# Patient Record
Sex: Male | Born: 1955
Health system: Southern US, Community
[De-identification: ages and names within clinical notes are randomized; demographics above are authoritative.]

## PROBLEM LIST (undated history)

## (undated) DIAGNOSIS — Z973 Presence of spectacles and contact lenses: Secondary | ICD-10-CM

## (undated) DIAGNOSIS — N529 Male erectile dysfunction, unspecified: Secondary | ICD-10-CM

## (undated) DIAGNOSIS — N189 Chronic kidney disease, unspecified: Secondary | ICD-10-CM

## (undated) DIAGNOSIS — F419 Anxiety disorder, unspecified: Secondary | ICD-10-CM

## (undated) DIAGNOSIS — Z9989 Dependence on other enabling machines and devices: Secondary | ICD-10-CM

## (undated) DIAGNOSIS — Z951 Presence of aortocoronary bypass graft: Secondary | ICD-10-CM

## (undated) DIAGNOSIS — I10 Essential (primary) hypertension: Secondary | ICD-10-CM

## (undated) DIAGNOSIS — K219 Gastro-esophageal reflux disease without esophagitis: Secondary | ICD-10-CM

## (undated) DIAGNOSIS — E782 Mixed hyperlipidemia: Secondary | ICD-10-CM

## (undated) DIAGNOSIS — R911 Solitary pulmonary nodule: Secondary | ICD-10-CM

## (undated) DIAGNOSIS — E119 Type 2 diabetes mellitus without complications: Secondary | ICD-10-CM

## (undated) DIAGNOSIS — I2510 Atherosclerotic heart disease of native coronary artery without angina pectoris: Secondary | ICD-10-CM

## (undated) DIAGNOSIS — G4733 Obstructive sleep apnea (adult) (pediatric): Secondary | ICD-10-CM

## (undated) DIAGNOSIS — Z8719 Personal history of other diseases of the digestive system: Secondary | ICD-10-CM

## (undated) DIAGNOSIS — F32A Depression, unspecified: Secondary | ICD-10-CM

## (undated) DIAGNOSIS — N35912 Unspecified bulbous urethral stricture, male: Secondary | ICD-10-CM

## (undated) HISTORY — DX: Type 2 diabetes mellitus without complications: E11.9

## (undated) HISTORY — PX: TONSILLECTOMY: SUR1361

## (undated) HISTORY — DX: Male erectile dysfunction, unspecified: N52.9

## (undated) HISTORY — PX: CORONARY ARTERY BYPASS GRAFT: SHX141

## (undated) HISTORY — DX: Mixed hyperlipidemia: E78.2

## (undated) HISTORY — PX: CARDIAC CATHETERIZATION: SHX172

## (undated) HISTORY — PX: OTHER SURGICAL HISTORY: SHX169

## (undated) HISTORY — DX: Essential (primary) hypertension: I10

## (undated) HISTORY — PX: CYSTOSCOPY WITH DIRECT VISION INTERNAL URETHROTOMY: SHX6637

---

## 1998-08-13 ENCOUNTER — Ambulatory Visit (HOSPITAL_COMMUNITY): Admission: RE | Admit: 1998-08-13 | Discharge: 1998-08-13 | Payer: Self-pay | Admitting: Family Medicine

## 1998-08-13 ENCOUNTER — Encounter: Payer: Self-pay | Admitting: Family Medicine

## 2003-04-03 ENCOUNTER — Ambulatory Visit (HOSPITAL_BASED_OUTPATIENT_CLINIC_OR_DEPARTMENT_OTHER): Admission: RE | Admit: 2003-04-03 | Discharge: 2003-04-03 | Payer: Self-pay | Admitting: Family Medicine

## 2003-08-01 ENCOUNTER — Inpatient Hospital Stay (HOSPITAL_COMMUNITY): Admission: EM | Admit: 2003-08-01 | Discharge: 2003-08-06 | Payer: Self-pay | Admitting: Emergency Medicine

## 2003-08-01 ENCOUNTER — Encounter: Payer: Self-pay | Admitting: Emergency Medicine

## 2003-08-05 ENCOUNTER — Encounter: Payer: Self-pay | Admitting: Internal Medicine

## 2003-11-21 DIAGNOSIS — Z8719 Personal history of other diseases of the digestive system: Secondary | ICD-10-CM

## 2003-11-21 HISTORY — DX: Personal history of other diseases of the digestive system: Z87.19

## 2004-12-21 ENCOUNTER — Ambulatory Visit (HOSPITAL_COMMUNITY): Admission: EM | Admit: 2004-12-21 | Discharge: 2004-12-21 | Payer: Self-pay | Admitting: Emergency Medicine

## 2007-07-26 IMAGING — CR DG CHEST 2V
2 series · 2 of 2 positions shown · non-contrast
Comparison: None

CLINICAL DATA: Preop.

CHEST - 2 VIEW

[w chest pa *]
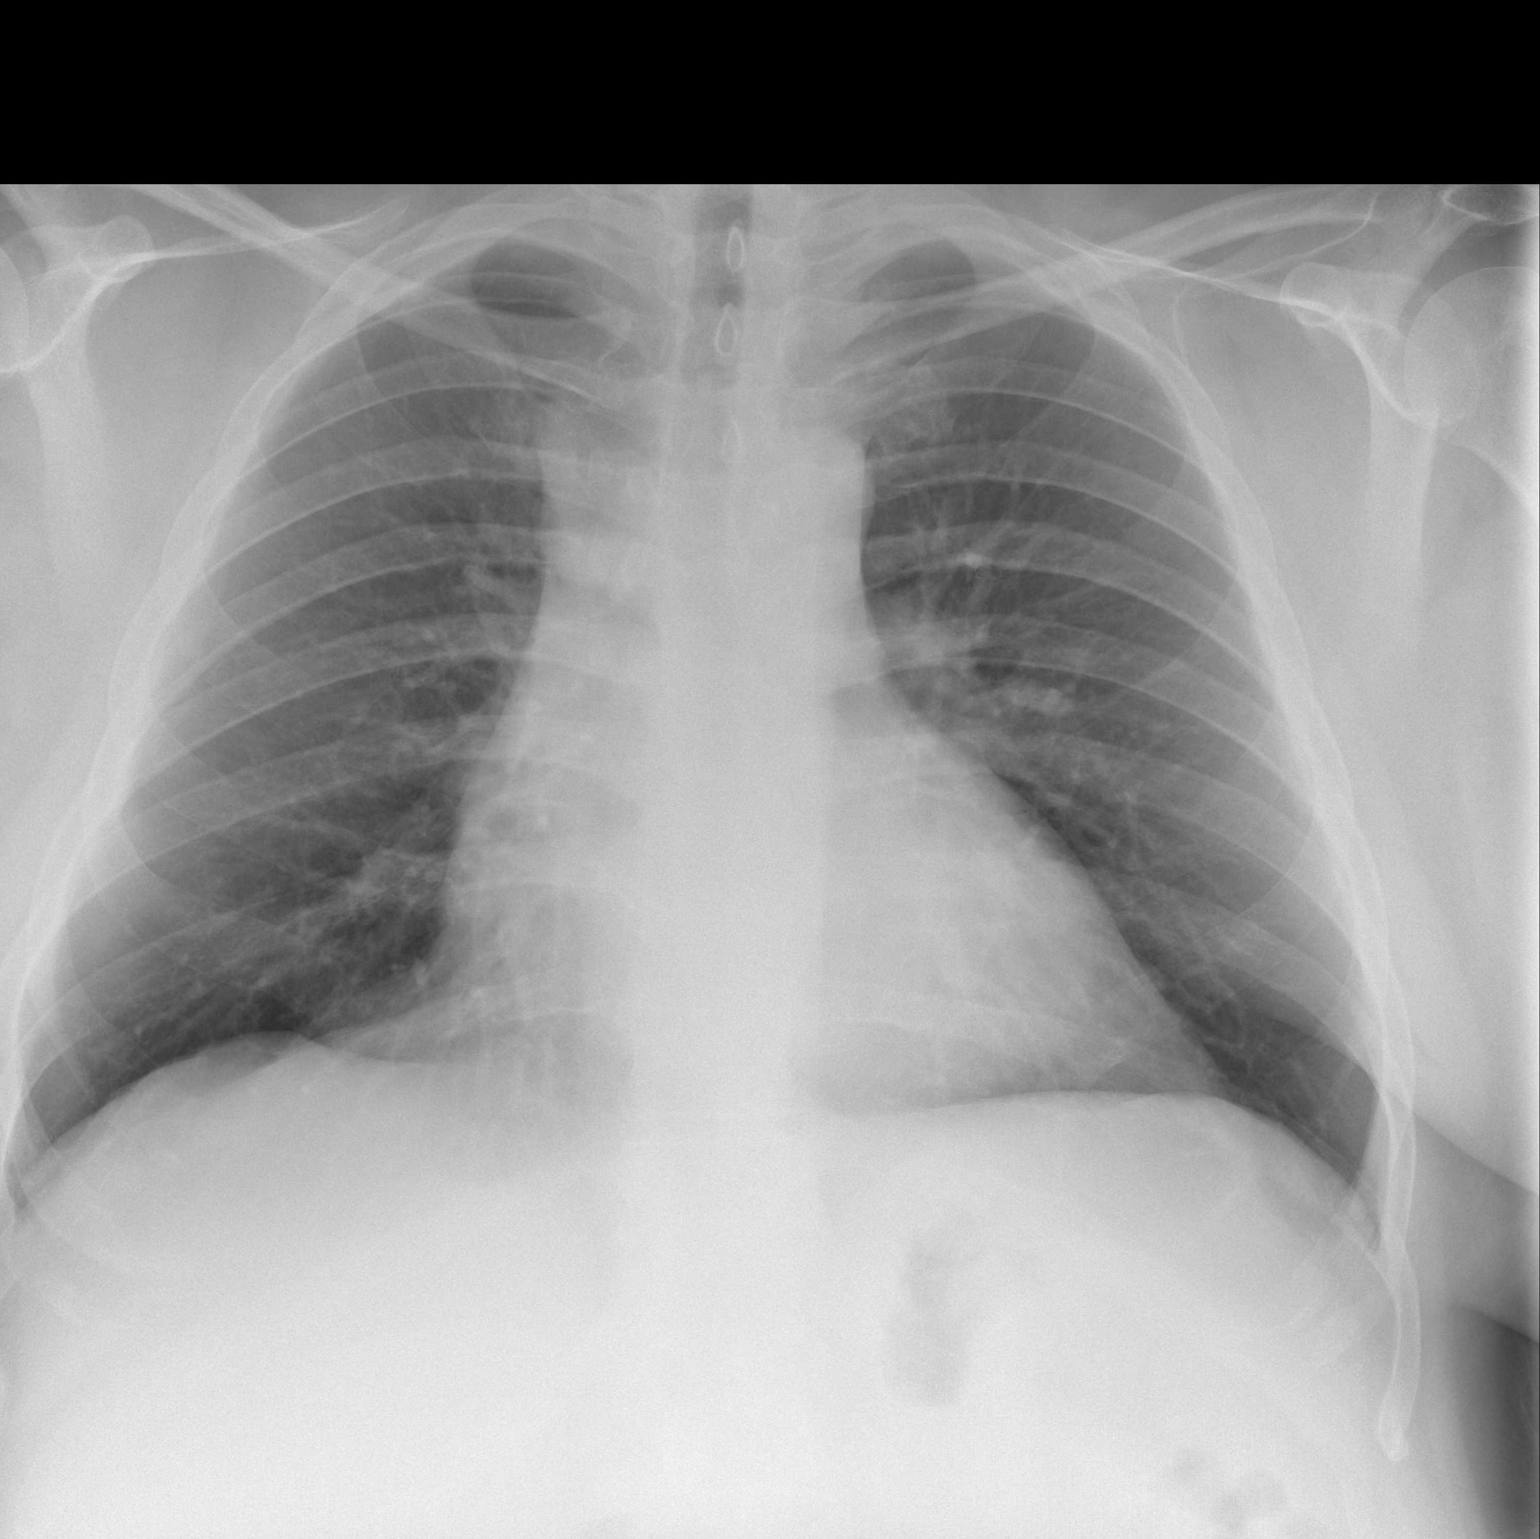

[w chest lat *]
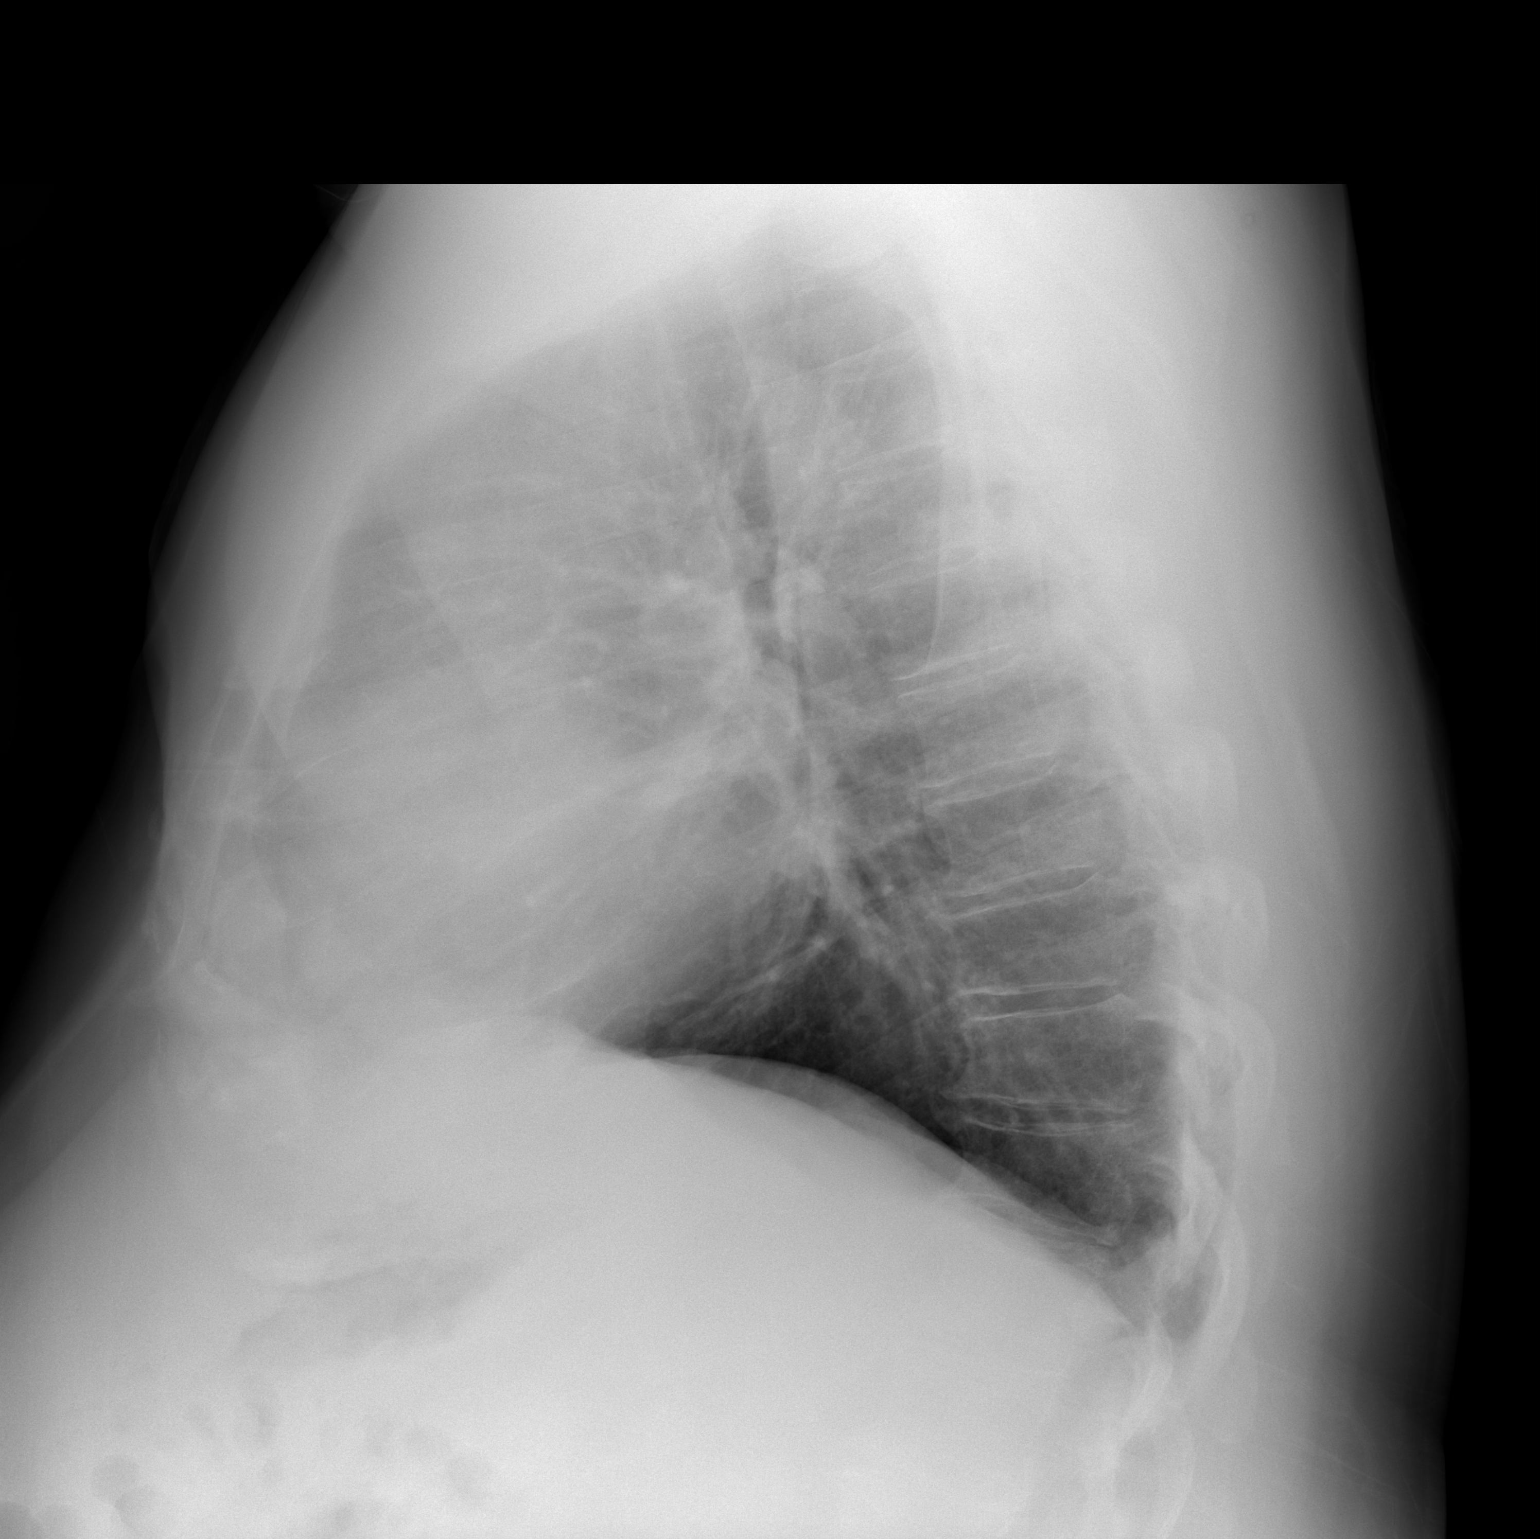

[2 of 2 positions shown; findings below may reference images not displayed]

FINDINGS: Trachea midline.  Heart is at the upper limits of normal
in size.  Lungs are clear.  No pleural fluid.
IMPRESSION: No acute findings.

## 2008-02-26 ENCOUNTER — Ambulatory Visit (HOSPITAL_BASED_OUTPATIENT_CLINIC_OR_DEPARTMENT_OTHER): Admission: RE | Admit: 2008-02-26 | Discharge: 2008-02-26 | Payer: Self-pay | Admitting: Urology

## 2008-07-15 ENCOUNTER — Ambulatory Visit (HOSPITAL_COMMUNITY): Admission: RE | Admit: 2008-07-15 | Discharge: 2008-07-15 | Payer: Self-pay | Admitting: Urology

## 2010-03-01 ENCOUNTER — Encounter: Payer: Self-pay | Admitting: Thoracic Surgery (Cardiothoracic Vascular Surgery)

## 2010-03-01 ENCOUNTER — Ambulatory Visit: Payer: Self-pay | Admitting: Thoracic Surgery (Cardiothoracic Vascular Surgery)

## 2010-03-01 ENCOUNTER — Inpatient Hospital Stay (HOSPITAL_COMMUNITY): Admission: EM | Admit: 2010-03-01 | Discharge: 2010-03-07 | Payer: Self-pay | Admitting: Interventional Cardiology

## 2010-03-01 ENCOUNTER — Inpatient Hospital Stay (HOSPITAL_BASED_OUTPATIENT_CLINIC_OR_DEPARTMENT_OTHER): Admission: RE | Admit: 2010-03-01 | Discharge: 2010-03-01 | Payer: Self-pay | Admitting: Interventional Cardiology

## 2010-03-02 IMAGING — CR DG CHEST 2V
2 series · 2 of 2 positions shown · non-contrast
Comparison: [DATE].

CLINICAL DATA: 53-year-old male with chest pain and shortness of
breath.

CHEST - 2 VIEW

[w chest pa]
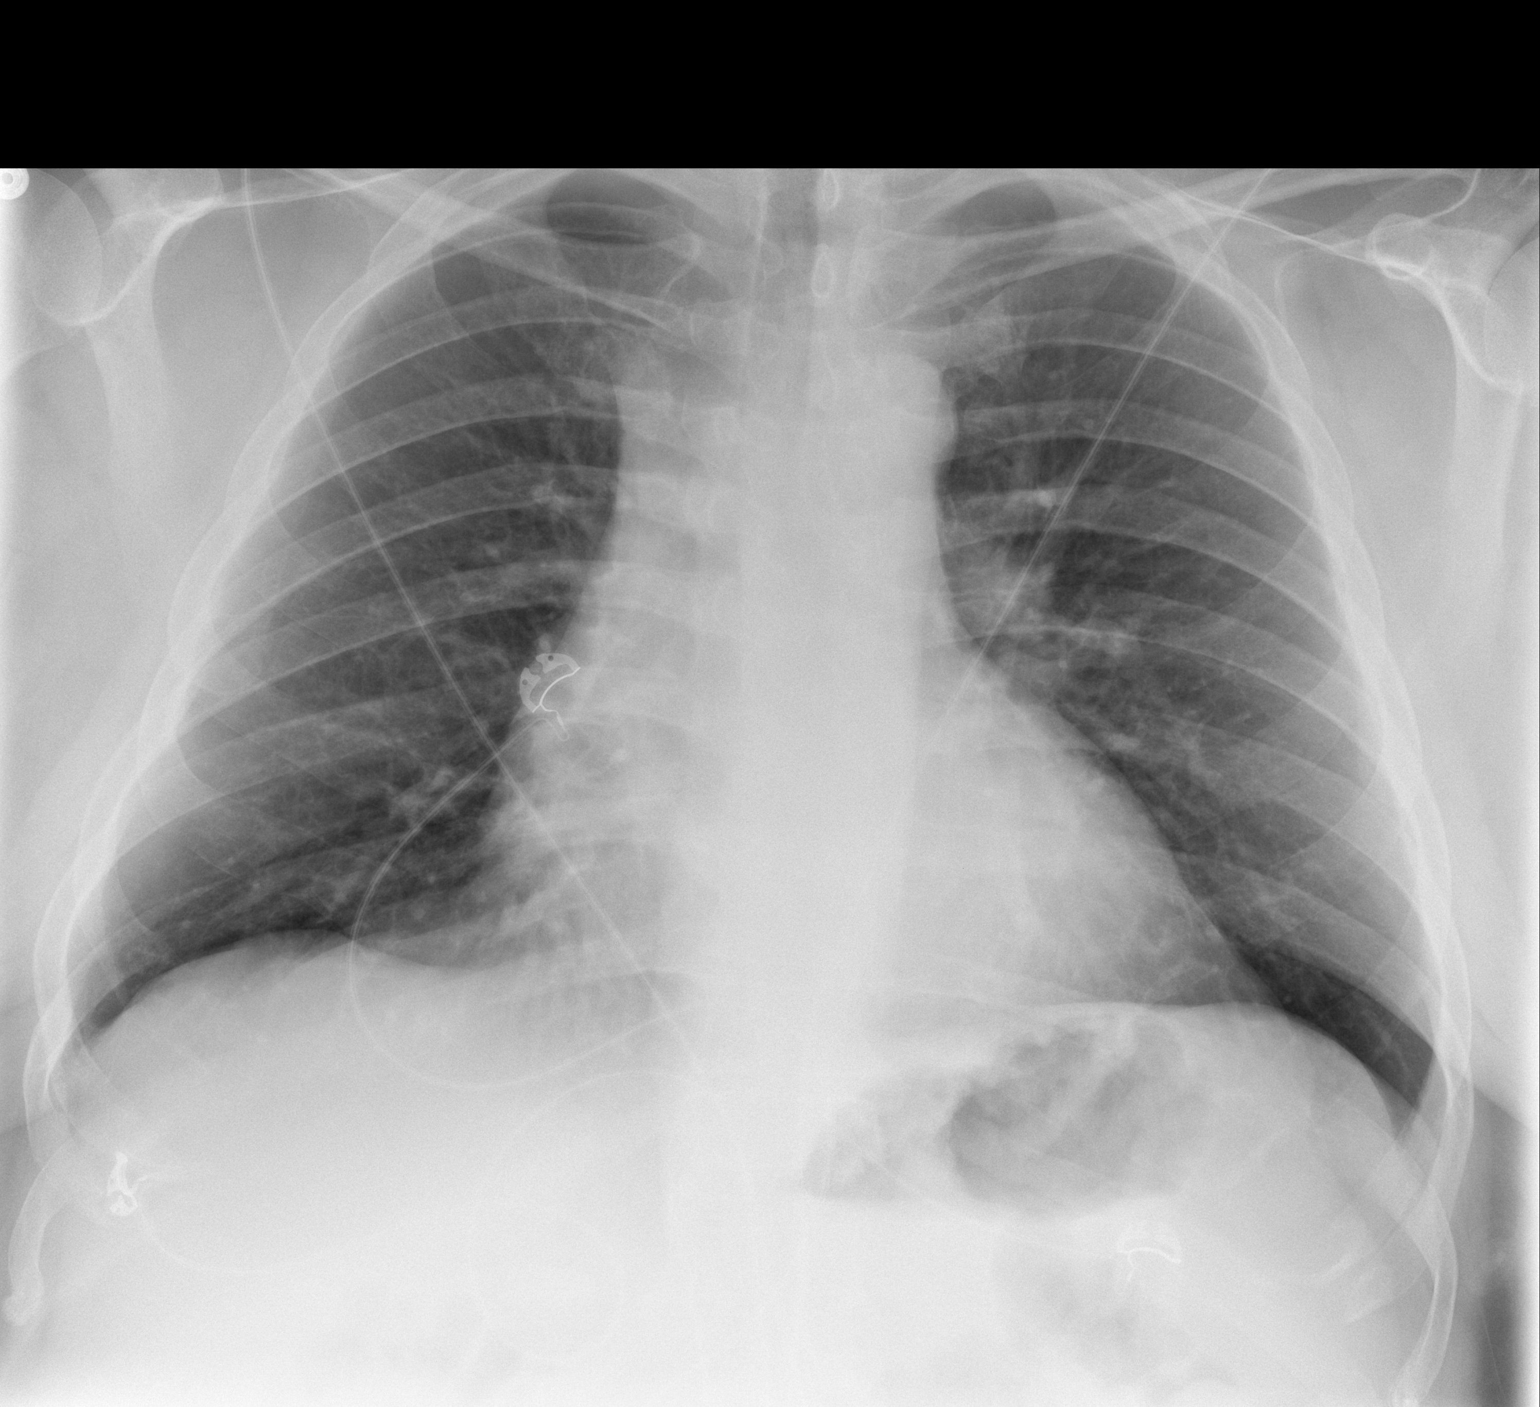

[w chest lat]
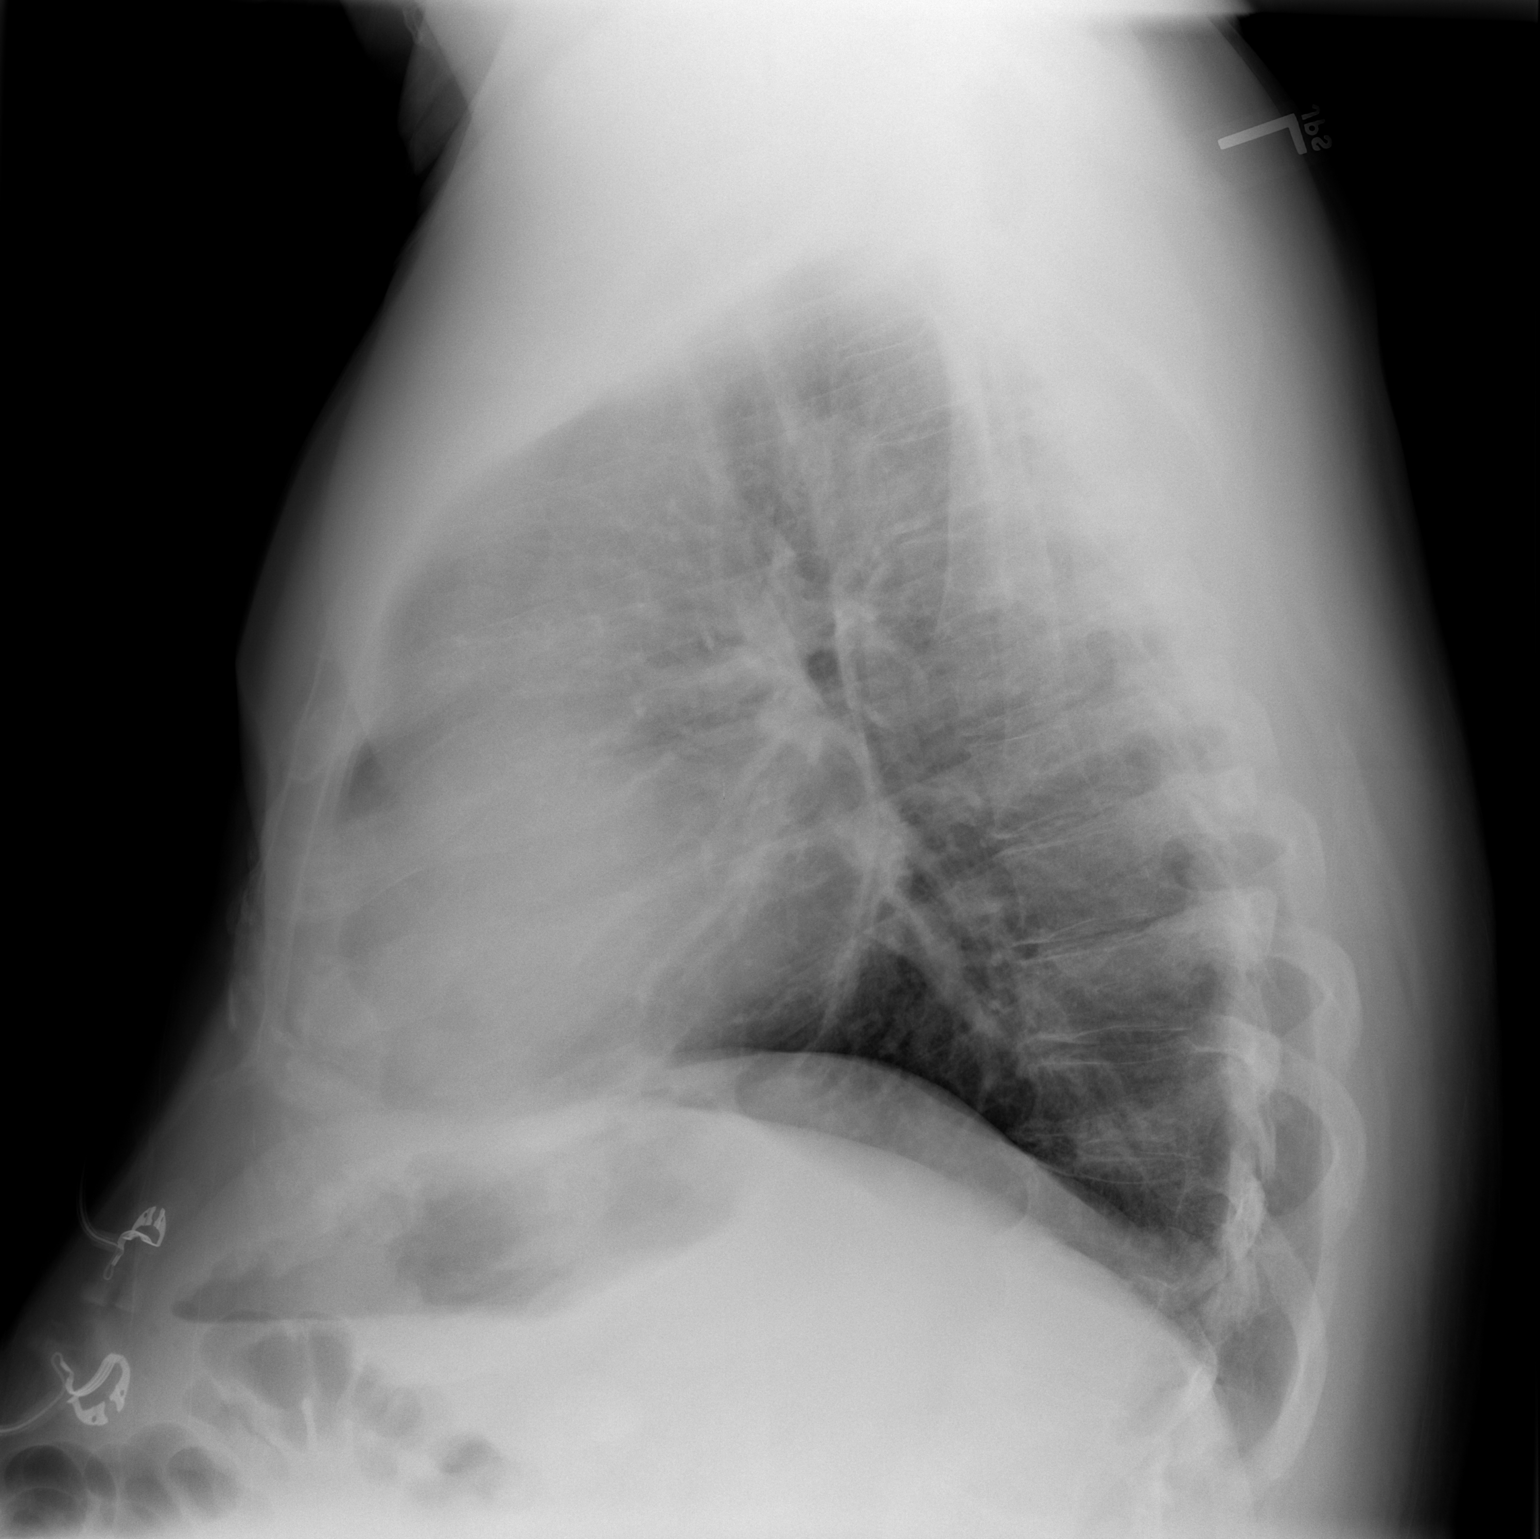

[2 of 2 positions shown; findings below may reference images not displayed]

FINDINGS: Stable mild cardiomegaly.  Other mediastinal contours are
within normal limits. Visualized tracheal air column is within
normal limits.  Normal lung volumes.  The lungs are clear. Stable
visualized osseous structures.
IMPRESSION: Stable cardiomegaly.  No acute cardiopulmonary abnormality.

## 2010-03-03 DIAGNOSIS — Z951 Presence of aortocoronary bypass graft: Secondary | ICD-10-CM

## 2010-03-03 HISTORY — DX: Presence of aortocoronary bypass graft: Z95.1

## 2010-03-03 IMAGING — CR DG CHEST 1V PORT
1 series · 1 of 1 positions shown · non-contrast
Comparison: Preop film of 1 day prior.

CLINICAL DATA: Status post CABG.

PORTABLE CHEST - 1 VIEW

[view not recorded]
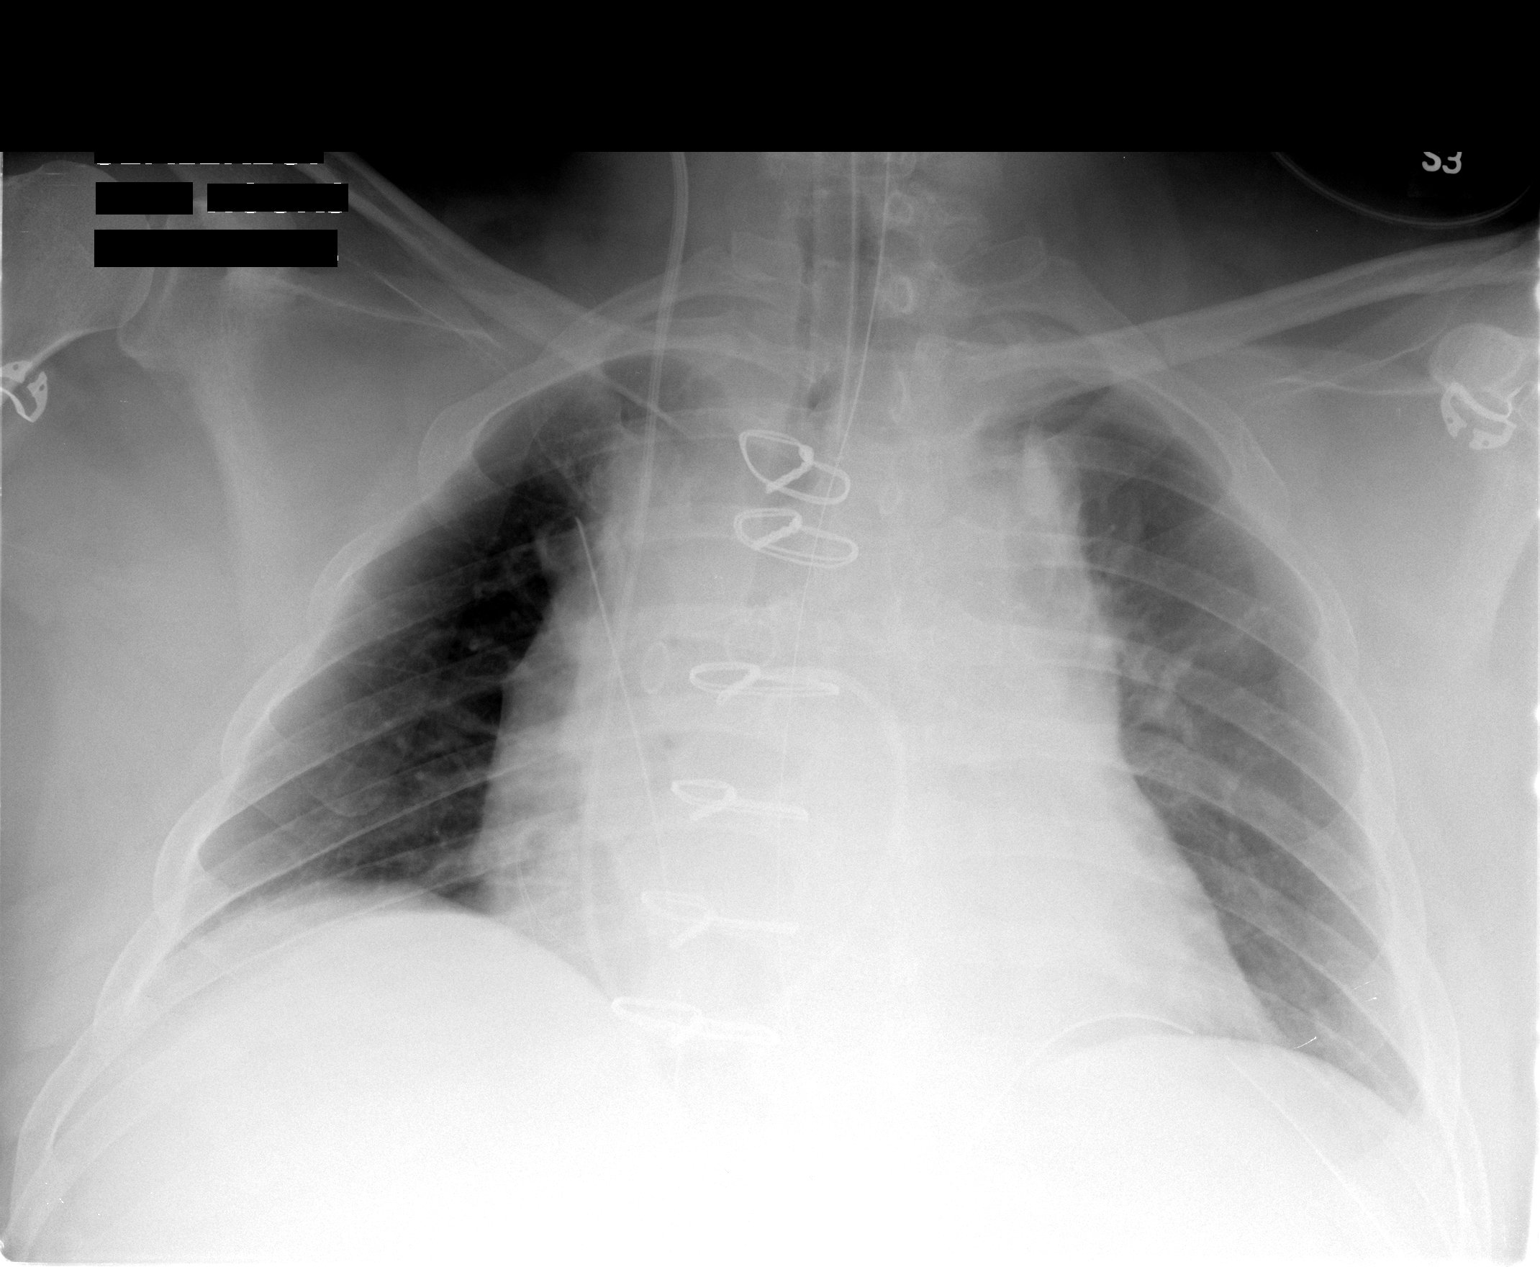

[1 of 1 positions shown; findings below may reference images not displayed]

FINDINGS: Endotracheal tube terminates 5.8 cm above the carina.
Nasogastric tube extends beyond the  inferior aspect of the film.
A right IJ Swan-Ganz catheter terminates at the pulmonary outflow
tract.

Interval median sternotomy.  Bilateral chest tubes are in place.

Midline trachea.  Apparent soft tissue fullness within the superior
mediastinum is likely technique related. No pleural effusion or
pneumothorax.  Moderately low lung volumes.  Mild bibasilar
atelectasis.
IMPRESSION: 1.  Status post median sternotomy with appropriate position of
support apparatus and no evidence of pneumothorax.
2.  Low lung volumes with mild bibasilar atelectasis.
3.  Apparent soft tissue fullness within the superior mediastinum.
This is likely technique related. Recommend attention on follow-up.
.

## 2010-03-04 IMAGING — CR DG CHEST 1V PORT
1 series · 1 of 1 positions shown · non-contrast
Comparison: Chest radiograph [DATE]

CLINICAL DATA: Multivessel disease.

PORTABLE CHEST - 1 VIEW

[view not recorded]
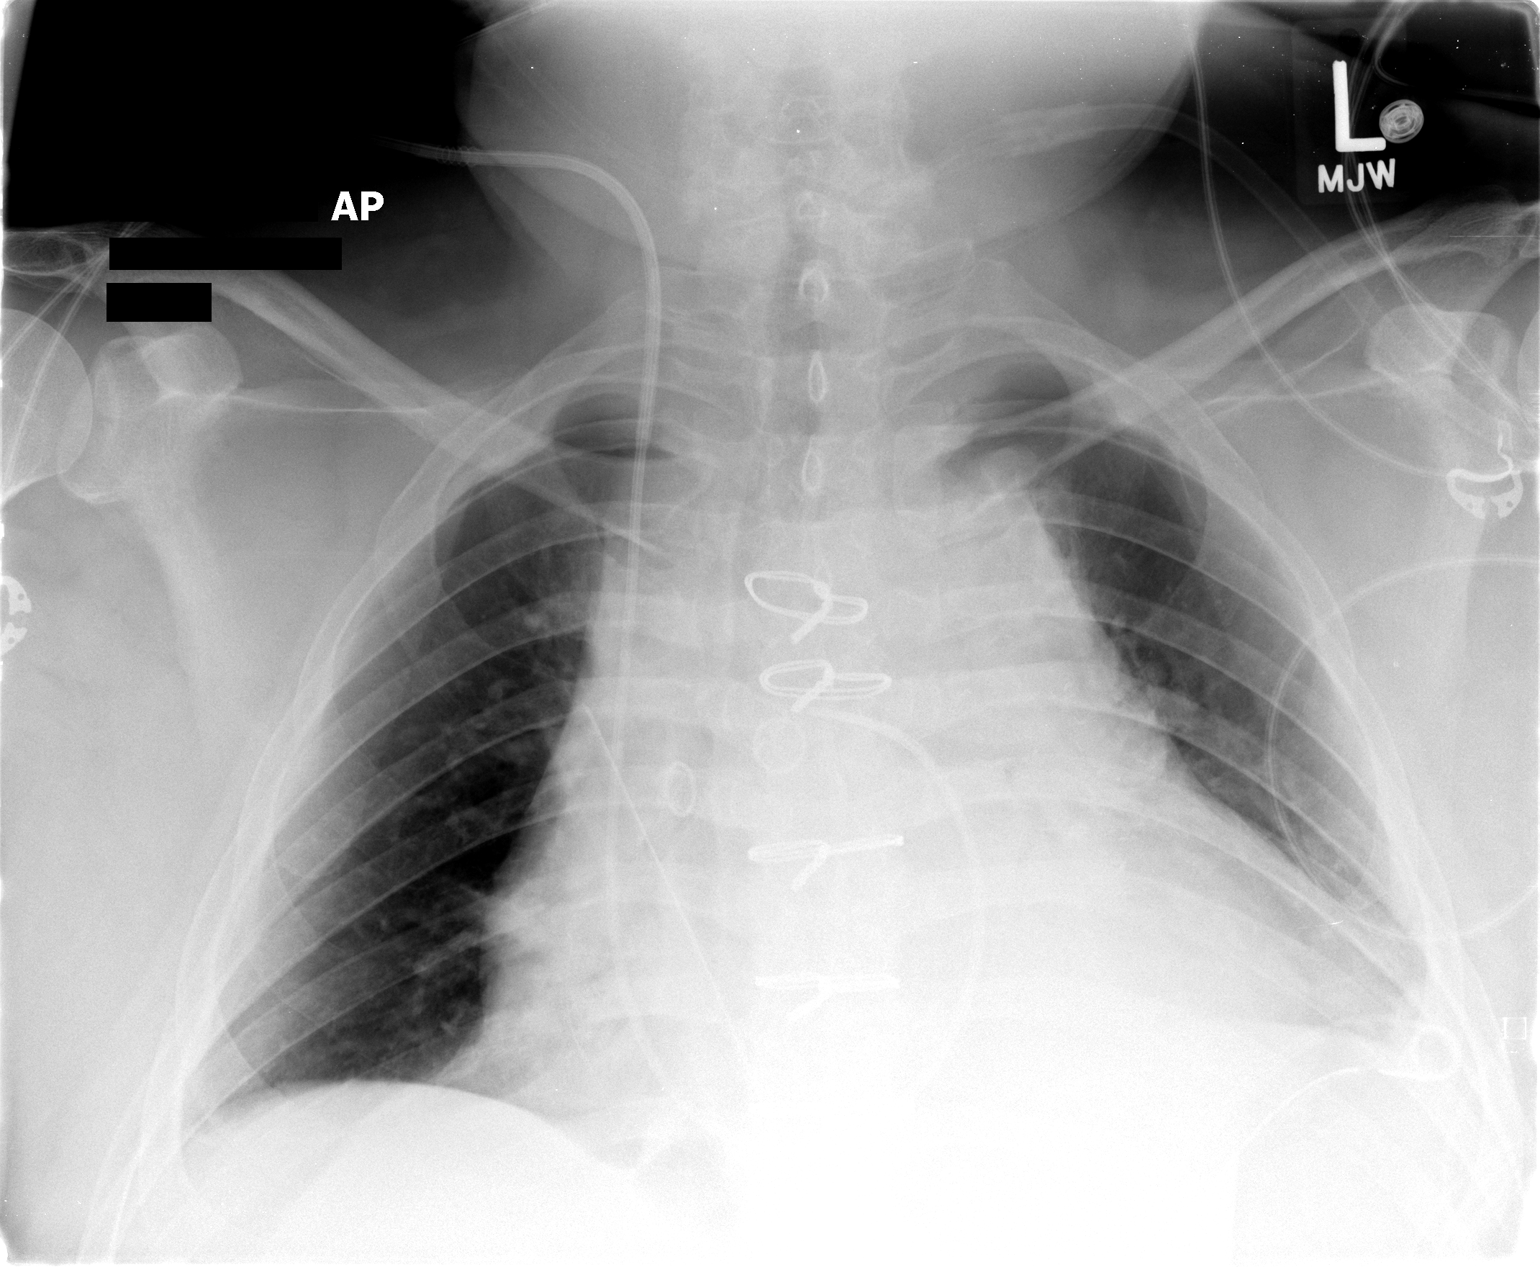

[1 of 1 positions shown; findings below may reference images not displayed]

FINDINGS: Interval extubation with no significant increase
atelectasis.  There is a mediastinal drain and left chest tube
remaining.  Swan-Ganz catheter unchanged in position.  No
pneumothorax.
IMPRESSION: 1.  Interval extubation with no increase in  atelectasis.
2.  Stable additional support apparatus.
3.  Bibasilar atelectasis.

## 2010-03-05 ENCOUNTER — Ambulatory Visit: Payer: Self-pay | Admitting: Cardiothoracic Surgery

## 2010-03-05 IMAGING — CR DG CHEST 2V
2 series · 2 of 2 positions shown · non-contrast
Comparison: [DATE]

CLINICAL DATA: Coronary bypass, chest tube removal

CHEST - 2 VIEW

[w chest pa]
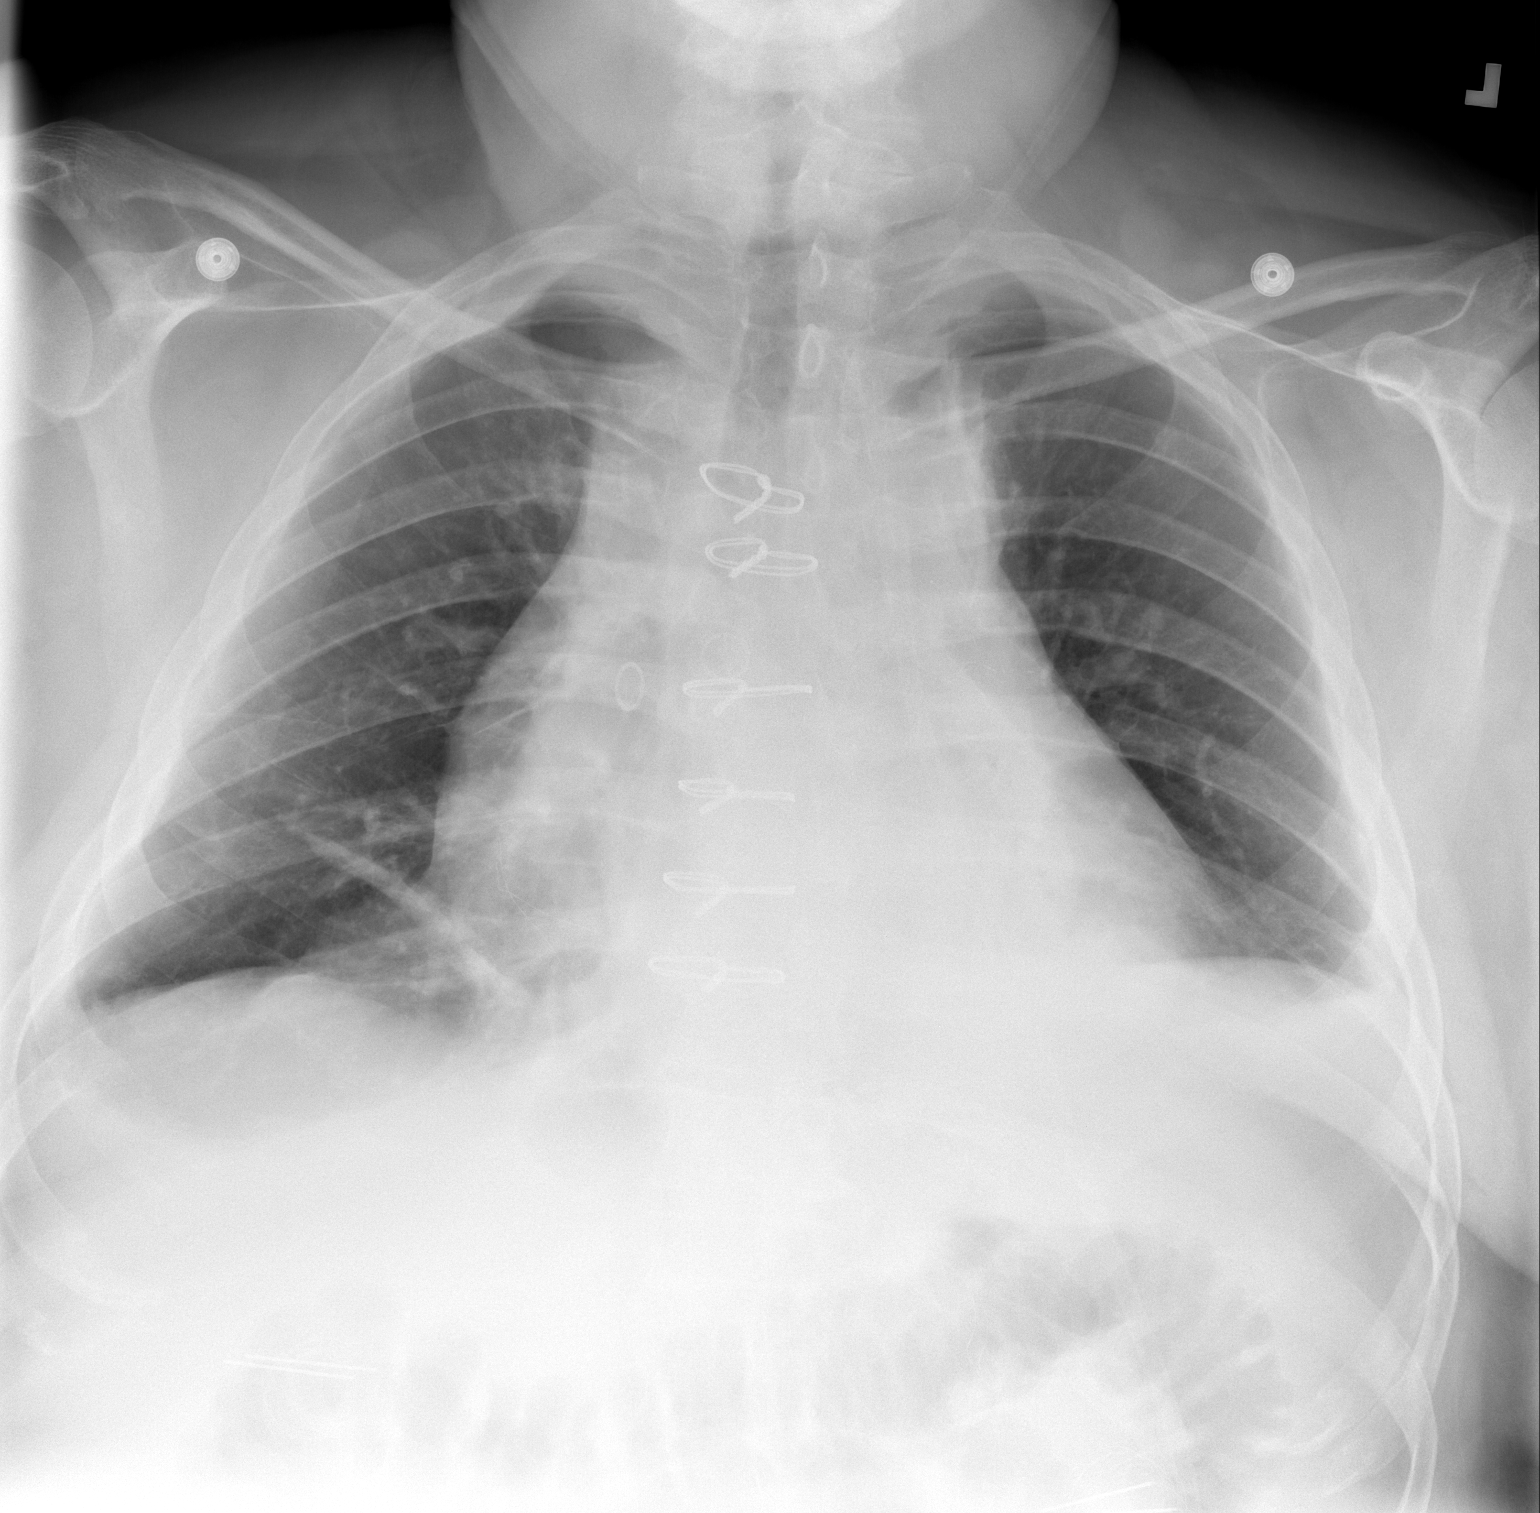

[w chest lat *]
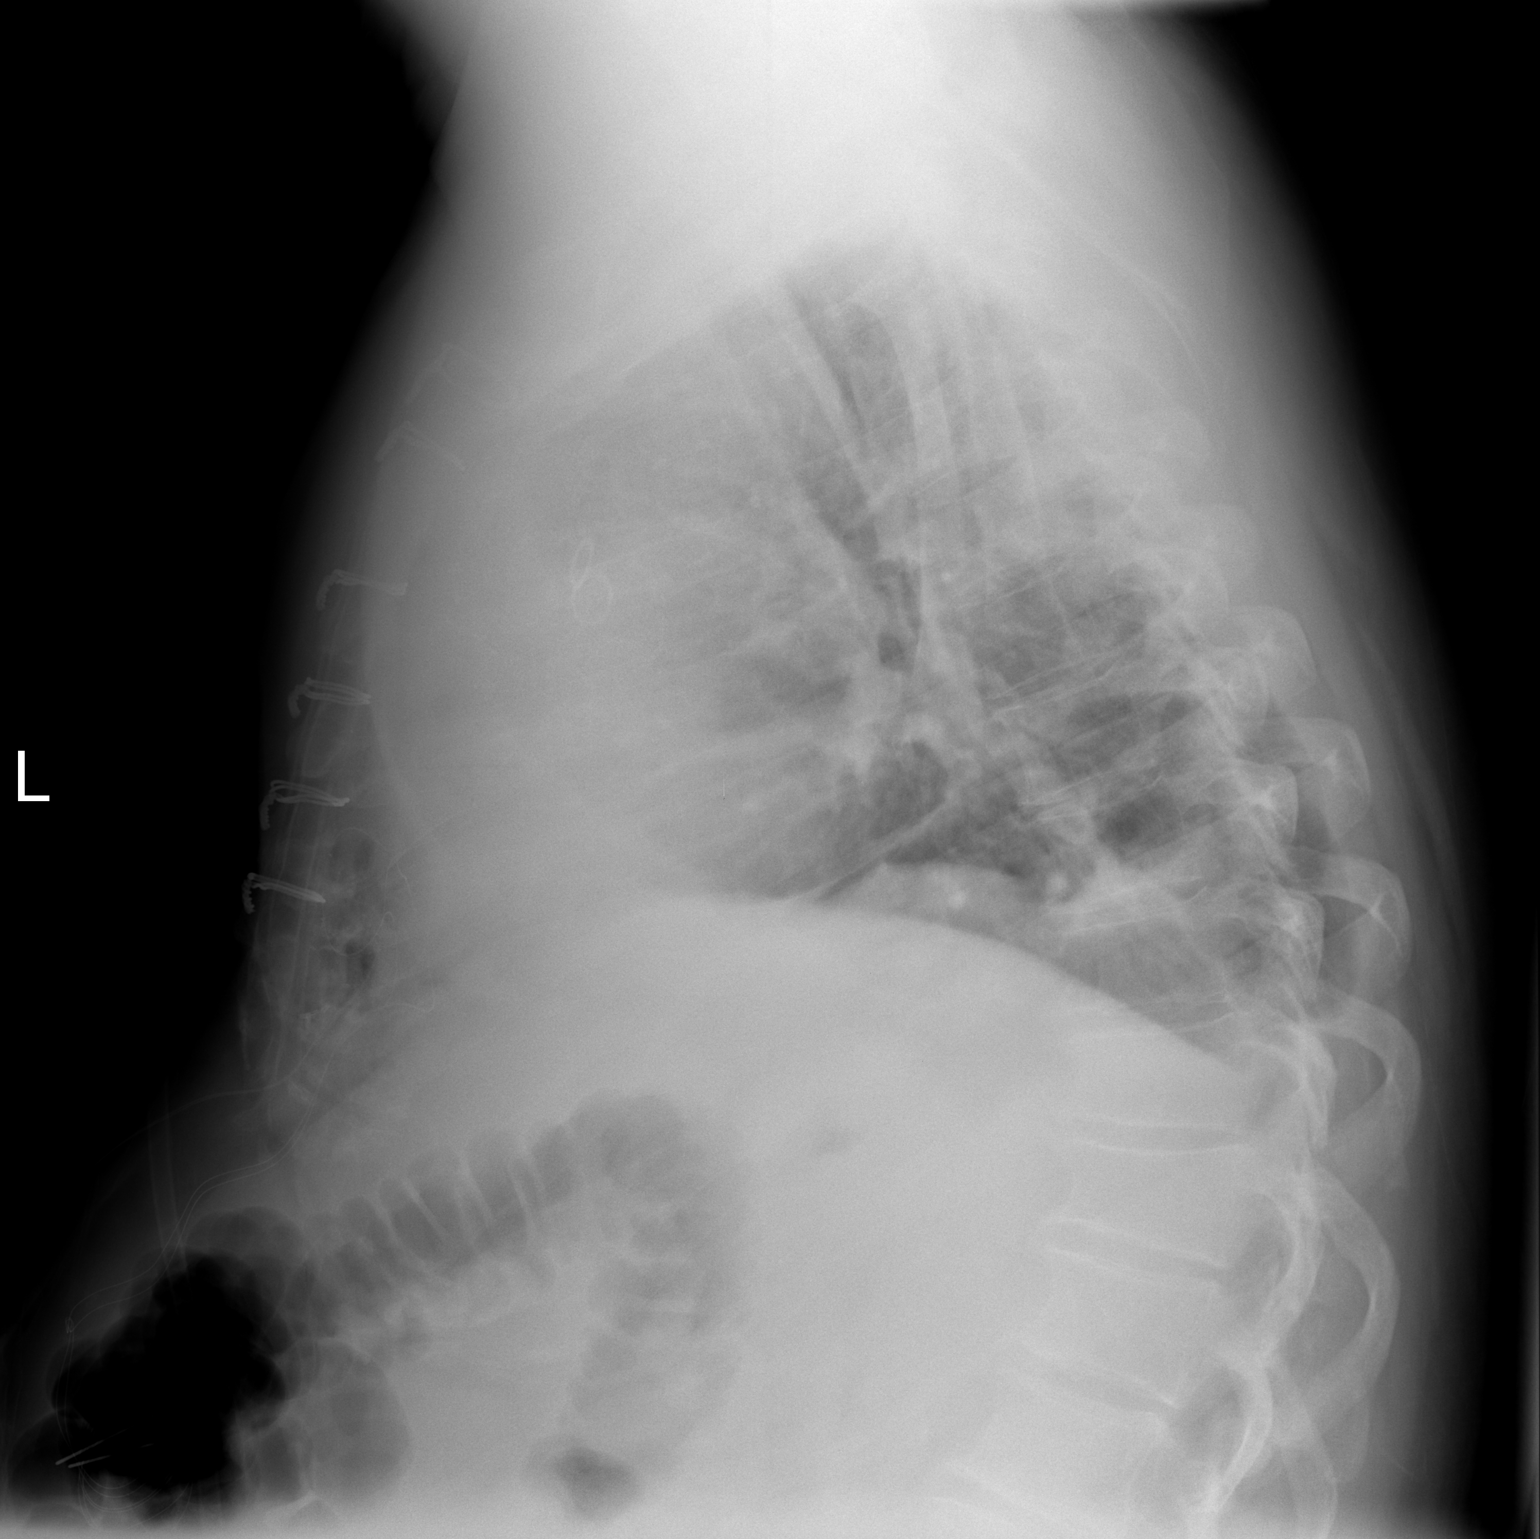

[2 of 2 positions shown; findings below may reference images not displayed]

FINDINGS: Mediastinal drain, chest tube and Swan-Ganz catheter all
removed.  Cardiac silhouette remains enlarged.  Coronary bypass
changes noted.  Persistent basilar atelectasis and trace pleural
effusions on the lateral view.  No pneumothorax.
IMPRESSION: Cardiomegaly and atelectasis.
No pneumothorax

## 2010-03-31 ENCOUNTER — Ambulatory Visit: Payer: Self-pay | Admitting: Thoracic Surgery (Cardiothoracic Vascular Surgery)

## 2010-03-31 ENCOUNTER — Encounter
Admission: RE | Admit: 2010-03-31 | Discharge: 2010-03-31 | Payer: Self-pay | Admitting: Thoracic Surgery (Cardiothoracic Vascular Surgery)

## 2010-03-31 IMAGING — CR DG CHEST 2V
2 series · 2 of 2 positions shown · non-contrast
Comparison: [DATE].

CLINICAL DATA: Status post CABG.  The

CHEST - 2 VIEW

[w chest pa]
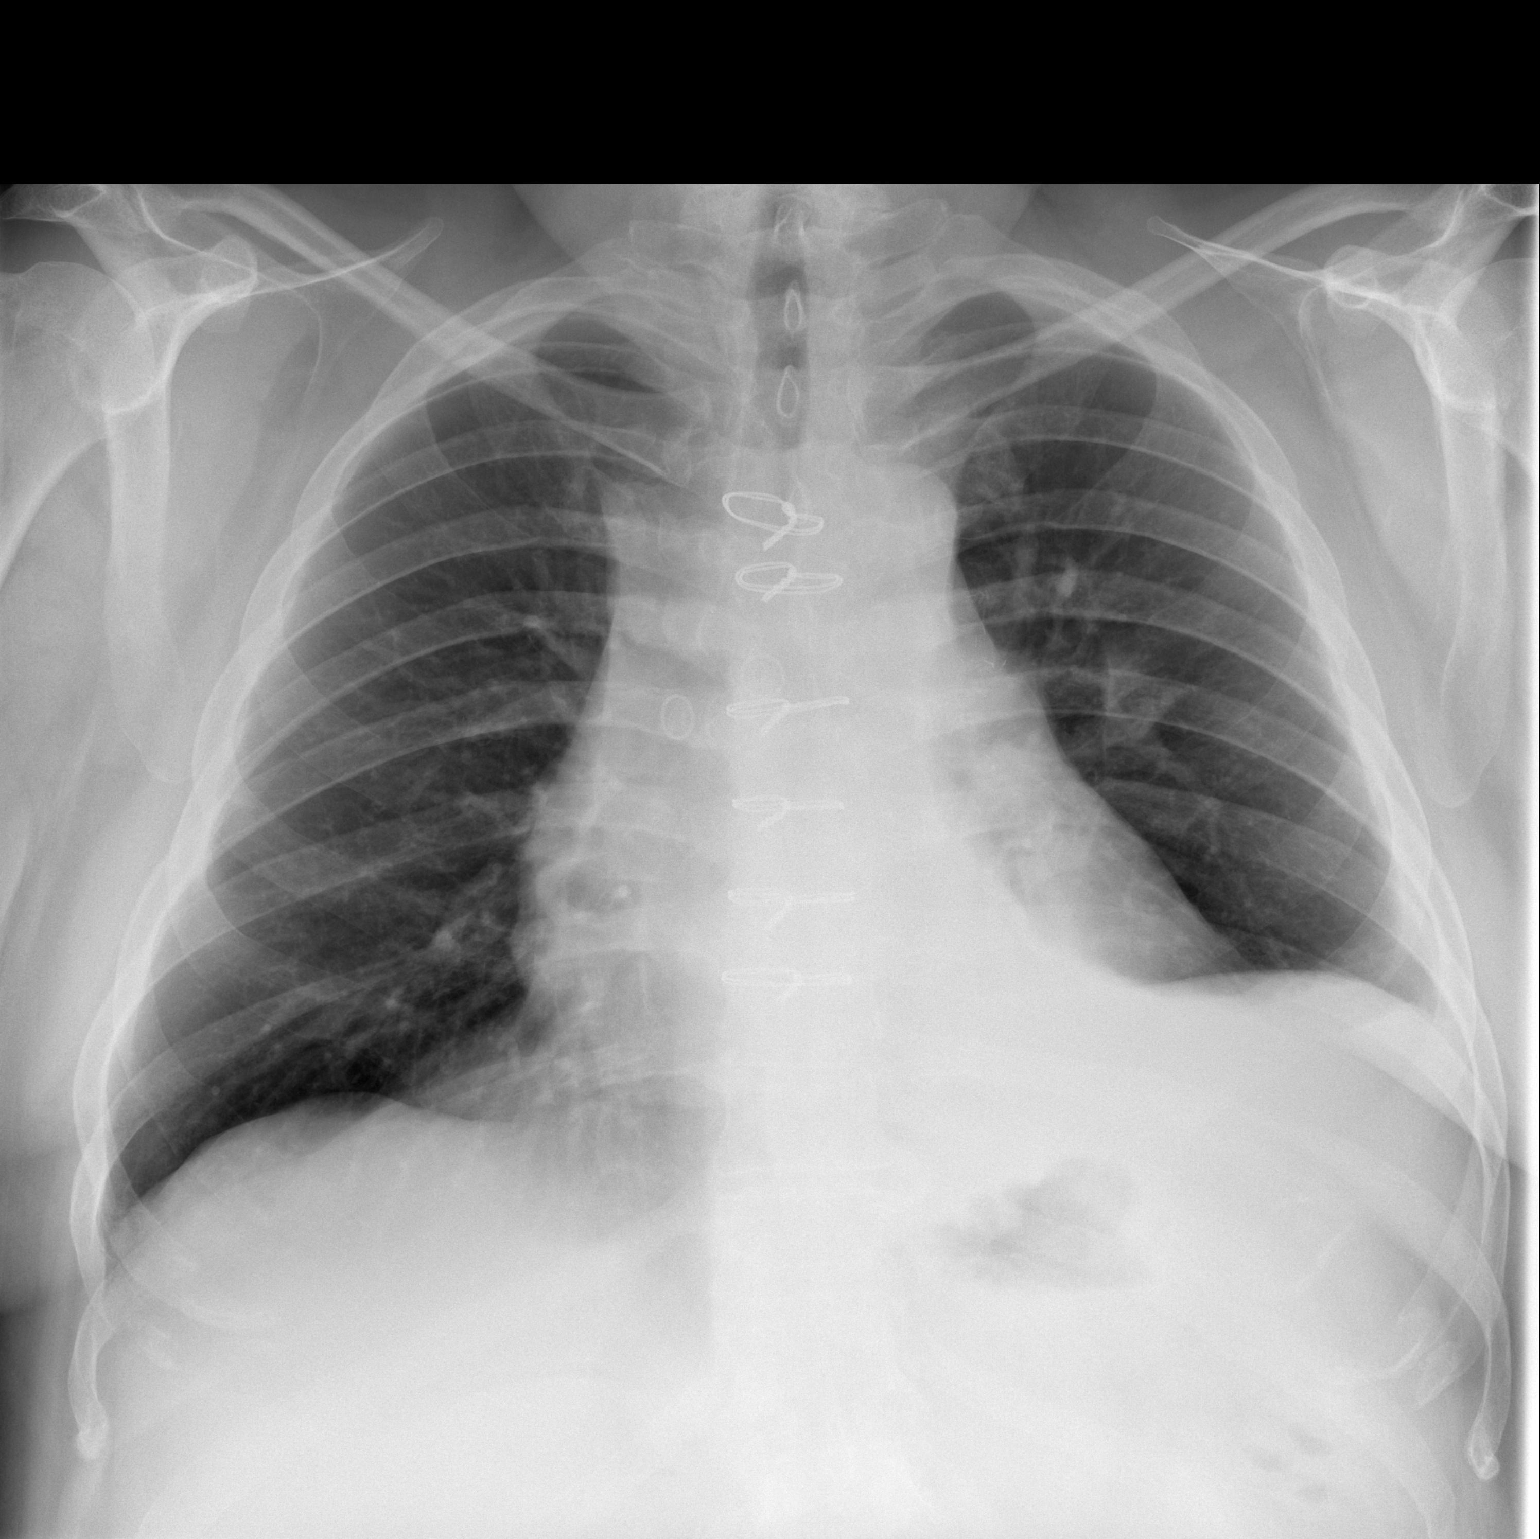

[w chest lat]
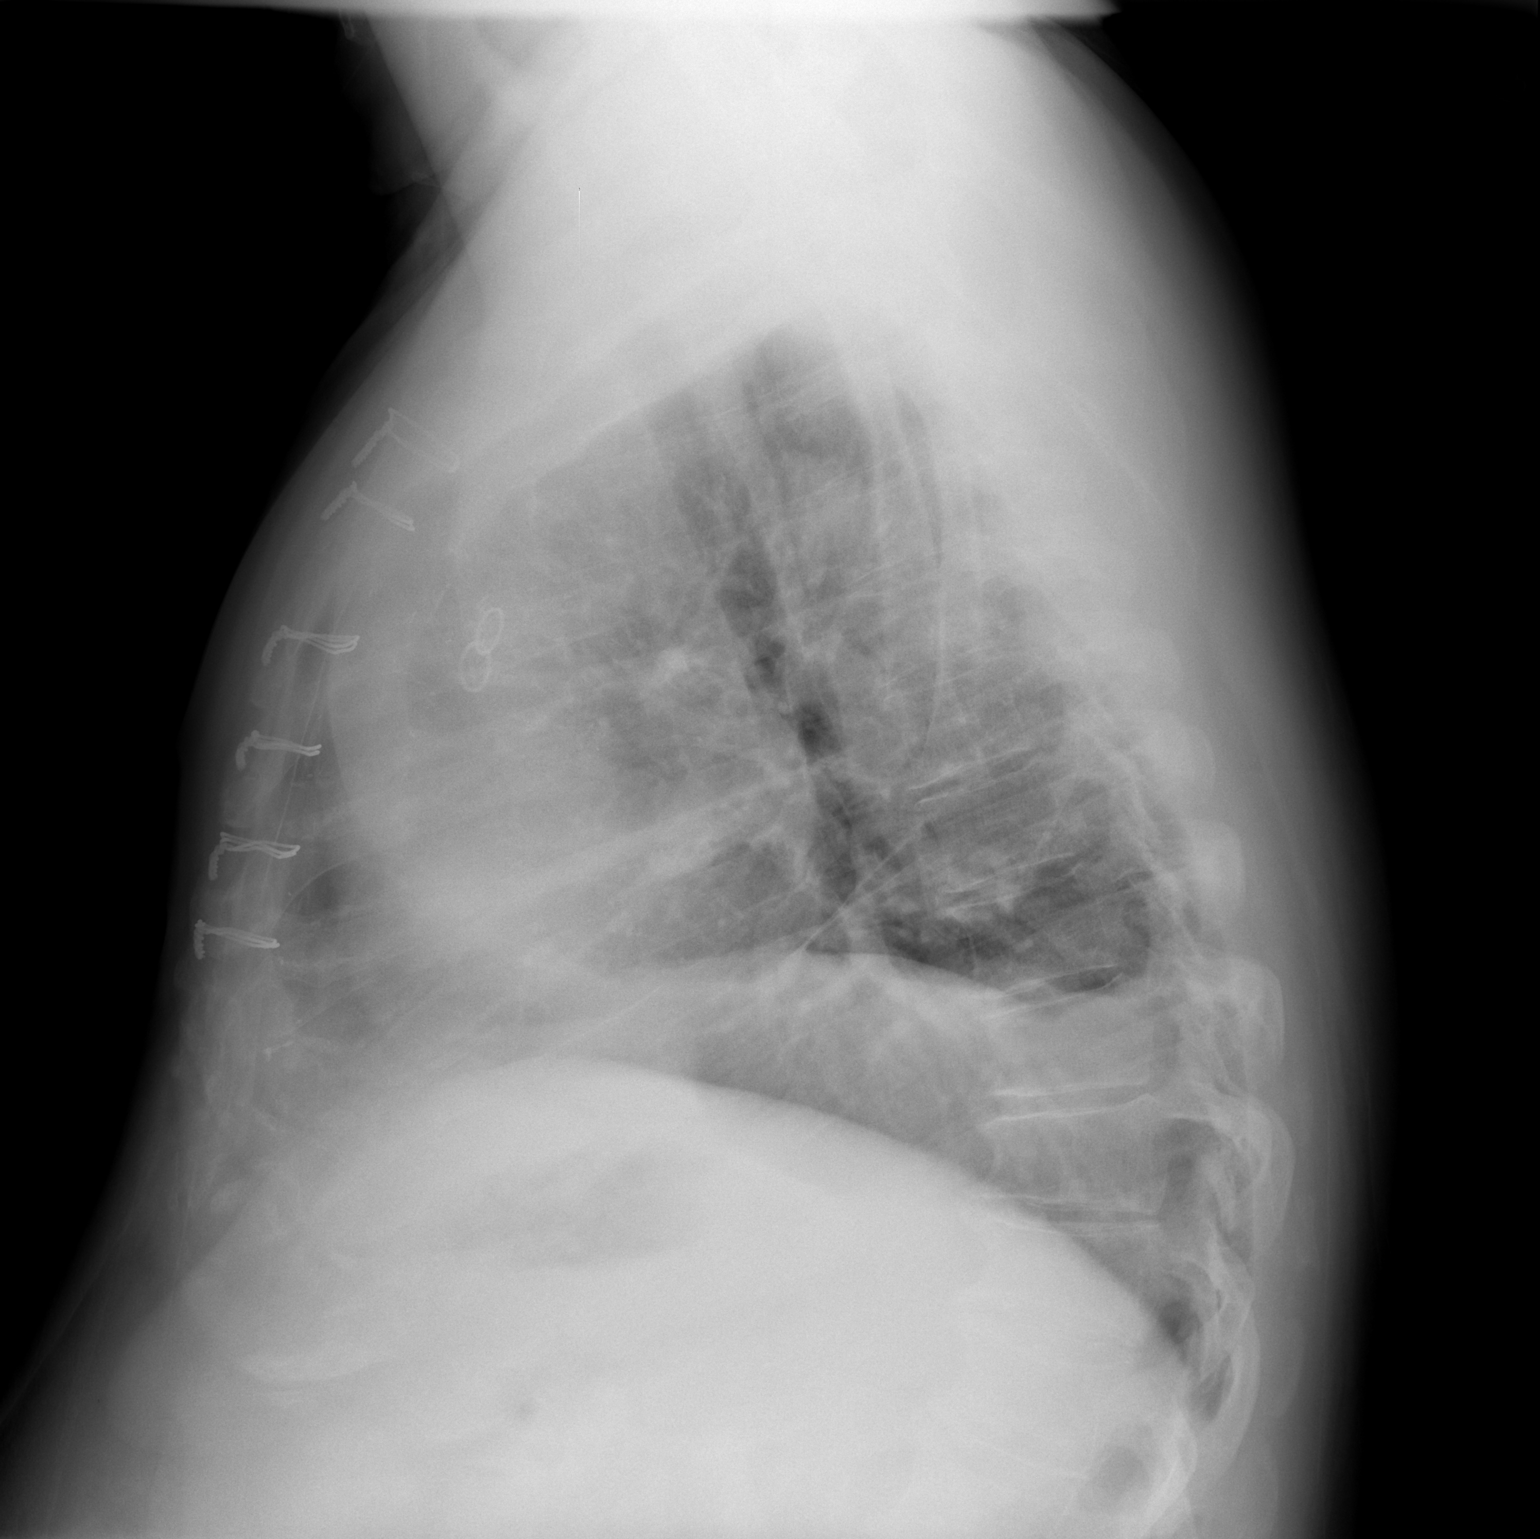

[2 of 2 positions shown; findings below may reference images not displayed]

FINDINGS: Focal density medial left lung base compatible with
partial left lower lobe atelectasis.  No pulmonary vascular
congestion.  Cardiomediastinal silhouette unremarkable.  Sternal
wire sutures and mediastinal clips (CABG.
IMPRESSION: Partial left lower lobe atelectasis.

## 2011-02-07 LAB — CBC
HCT: 27.3 % — ABNORMAL LOW (ref 39.0–52.0)
HCT: 29.1 % — ABNORMAL LOW (ref 39.0–52.0)
HCT: 30.7 % — ABNORMAL LOW (ref 39.0–52.0)
HCT: 31.1 % — ABNORMAL LOW (ref 39.0–52.0)
Hemoglobin: 10.5 g/dL — ABNORMAL LOW (ref 13.0–17.0)
Hemoglobin: 9.4 g/dL — ABNORMAL LOW (ref 13.0–17.0)
Hemoglobin: 9.9 g/dL — ABNORMAL LOW (ref 13.0–17.0)
MCHC: 34.2 g/dL (ref 30.0–36.0)
MCHC: 34.4 g/dL (ref 30.0–36.0)
MCV: 86.3 fL (ref 78.0–100.0)
Platelets: 117 10*3/uL — ABNORMAL LOW (ref 150–400)
Platelets: 126 10*3/uL — ABNORMAL LOW (ref 150–400)
Platelets: 144 10*3/uL — ABNORMAL LOW (ref 150–400)
RBC: 3.16 MIL/uL — ABNORMAL LOW (ref 4.22–5.81)
RBC: 3.56 MIL/uL — ABNORMAL LOW (ref 4.22–5.81)
RBC: 3.61 MIL/uL — ABNORMAL LOW (ref 4.22–5.81)
WBC: 6.6 10*3/uL (ref 4.0–10.5)
WBC: 8.3 10*3/uL (ref 4.0–10.5)
WBC: 8.6 10*3/uL (ref 4.0–10.5)

## 2011-02-07 LAB — BASIC METABOLIC PANEL
BUN: 10 mg/dL (ref 6–23)
BUN: 11 mg/dL (ref 6–23)
BUN: 7 mg/dL (ref 6–23)
CO2: 24 mEq/L (ref 19–32)
Calcium: 8 mg/dL — ABNORMAL LOW (ref 8.4–10.5)
Calcium: 8.5 mg/dL (ref 8.4–10.5)
Chloride: 103 mEq/L (ref 96–112)
Chloride: 109 mEq/L (ref 96–112)
Creatinine, Ser: 1.26 mg/dL (ref 0.4–1.5)
GFR calc Af Amer: >60 mL/min (ref >60)
GFR calc non Af Amer: 50 mL/min — ABNORMAL LOW (ref >60)
Glucose, Bld: 94 mg/dL (ref 70–99)
Potassium: 3.7 mEq/L (ref 3.5–5.1)
Potassium: 3.9 mEq/L (ref 3.5–5.1)
Sodium: 137 mEq/L (ref 135–145)

## 2011-02-07 LAB — GLUCOSE, CAPILLARY
Glucose-Capillary: 110 mg/dL — ABNORMAL HIGH (ref 70–99)
Glucose-Capillary: 113 mg/dL — ABNORMAL HIGH (ref 70–99)
Glucose-Capillary: 114 mg/dL — ABNORMAL HIGH (ref 70–99)
Glucose-Capillary: 131 mg/dL — ABNORMAL HIGH (ref 70–99)
Glucose-Capillary: 132 mg/dL — ABNORMAL HIGH (ref 70–99)

## 2011-02-07 LAB — CREATININE, SERUM
Creatinine, Ser: 1.39 mg/dL (ref 0.4–1.5)
GFR calc Af Amer: >60 mL/min (ref >60)
GFR calc non Af Amer: 54 mL/min — ABNORMAL LOW (ref >60)

## 2011-02-08 LAB — URINALYSIS, ROUTINE W REFLEX MICROSCOPIC
Glucose, UA: NEGATIVE mg/dL
Nitrite: NEGATIVE
Specific Gravity, Urine: 1.006 (ref 1.005–1.030)
pH: 8 (ref 5.0–8.0)

## 2011-02-08 LAB — BASIC METABOLIC PANEL
BUN: 10 mg/dL (ref 6–23)
CO2: 27 mEq/L (ref 19–32)
CO2: 28 mEq/L (ref 19–32)
Calcium: 9.5 mg/dL (ref 8.4–10.5)
Chloride: 100 mEq/L (ref 96–112)
Chloride: 105 mEq/L (ref 96–112)
Creatinine, Ser: 1.49 mg/dL (ref 0.4–1.5)
Glucose, Bld: 119 mg/dL — ABNORMAL HIGH (ref 70–99)
Glucose, Bld: 195 mg/dL — ABNORMAL HIGH (ref 70–99)
Potassium: 3.6 mEq/L (ref 3.5–5.1)

## 2011-02-08 LAB — CREATININE, SERUM
GFR calc Af Amer: >60 mL/min (ref >60)
GFR calc non Af Amer: 55 mL/min — ABNORMAL LOW (ref >60)

## 2011-02-08 LAB — BLOOD GAS, ARTERIAL
FIO2: 0.21 %
O2 Saturation: 97.8 %
pO2, Arterial: 83.7 mmHg (ref 80.0–100.0)

## 2011-02-08 LAB — POCT I-STAT 4, (NA,K, GLUC, HGB,HCT)
Glucose, Bld: 104 mg/dL — ABNORMAL HIGH (ref 70–99)
Glucose, Bld: 127 mg/dL — ABNORMAL HIGH (ref 70–99)
Glucose, Bld: 97 mg/dL (ref 70–99)
HCT: 28 % — ABNORMAL LOW (ref 39.0–52.0)
HCT: 31 % — ABNORMAL LOW (ref 39.0–52.0)
HCT: 33 % — ABNORMAL LOW (ref 39.0–52.0)
HCT: 41 % (ref 39.0–52.0)
Hemoglobin: 11.2 g/dL — ABNORMAL LOW (ref 13.0–17.0)
Hemoglobin: 13.3 g/dL (ref 13.0–17.0)
Hemoglobin: 13.9 g/dL (ref 13.0–17.0)
Hemoglobin: 9.5 g/dL — ABNORMAL LOW (ref 13.0–17.0)
Potassium: 4.3 mEq/L (ref 3.5–5.1)
Potassium: 5.1 mEq/L (ref 3.5–5.1)
Potassium: 5.6 mEq/L — ABNORMAL HIGH (ref 3.5–5.1)
Sodium: 134 mEq/L — ABNORMAL LOW (ref 135–145)
Sodium: 135 mEq/L (ref 135–145)
Sodium: 136 mEq/L (ref 135–145)
Sodium: 139 mEq/L (ref 135–145)

## 2011-02-08 LAB — MAGNESIUM: Magnesium: 2.7 mg/dL — ABNORMAL HIGH (ref 1.5–2.5)

## 2011-02-08 LAB — CBC
Hemoglobin: 11.9 g/dL — ABNORMAL LOW (ref 13.0–17.0)
Hemoglobin: 13.1 g/dL (ref 13.0–17.0)
Platelets: 166 10*3/uL (ref 150–400)
RBC: 4.61 MIL/uL (ref 4.22–5.81)
RDW: 15.3 % (ref 11.5–15.5)
WBC: 9.3 10*3/uL (ref 4.0–10.5)

## 2011-02-08 LAB — POCT I-STAT 3, ART BLOOD GAS (G3+)
Acid-base deficit: 1 mmol/L (ref 0.0–2.0)
Acid-base deficit: 3 mmol/L — ABNORMAL HIGH (ref 0.0–2.0)
O2 Saturation: 100 %
O2 Saturation: 97 %
Patient temperature: 35.5
TCO2: 24 mmol/L (ref 0–100)
pCO2 arterial: 34.9 mmHg — ABNORMAL LOW (ref 35.0–45.0)
pCO2 arterial: 35.7 mmHg (ref 35.0–45.0)
pH, Arterial: 7.383 (ref 7.350–7.450)
pO2, Arterial: 68 mmHg — ABNORMAL LOW (ref 80.0–100.0)
pO2, Arterial: 73 mmHg — ABNORMAL LOW (ref 80.0–100.0)
pO2, Arterial: 85 mmHg (ref 80.0–100.0)

## 2011-02-08 LAB — CROSSMATCH

## 2011-02-08 LAB — COMPREHENSIVE METABOLIC PANEL
ALT: 32 U/L (ref 0–53)
Alkaline Phosphatase: 29 U/L — ABNORMAL LOW (ref 39–117)
CO2: 29 mEq/L (ref 19–32)
GFR calc non Af Amer: 43 mL/min — ABNORMAL LOW (ref >60)
Glucose, Bld: 101 mg/dL — ABNORMAL HIGH (ref 70–99)
Potassium: 3.2 mEq/L — ABNORMAL LOW (ref 3.5–5.1)
Sodium: 139 mEq/L (ref 135–145)

## 2011-02-08 LAB — POCT I-STAT, CHEM 8
BUN: 11 mg/dL (ref 6–23)
Calcium, Ion: 1.19 mmol/L (ref 1.12–1.32)
Glucose, Bld: 158 mg/dL — ABNORMAL HIGH (ref 70–99)
HCT: 34 % — ABNORMAL LOW (ref 39.0–52.0)
TCO2: 19 mmol/L (ref 0–100)

## 2011-02-08 LAB — APTT
aPTT: 27 seconds (ref 24–37)
aPTT: 28 seconds (ref 24–37)

## 2011-02-08 LAB — GLUCOSE, CAPILLARY
Glucose-Capillary: 100 mg/dL — ABNORMAL HIGH (ref 70–99)
Glucose-Capillary: 107 mg/dL — ABNORMAL HIGH (ref 70–99)
Glucose-Capillary: 131 mg/dL — ABNORMAL HIGH (ref 70–99)
Glucose-Capillary: 141 mg/dL — ABNORMAL HIGH (ref 70–99)
Glucose-Capillary: 146 mg/dL — ABNORMAL HIGH (ref 70–99)
Glucose-Capillary: 84 mg/dL (ref 70–99)

## 2011-02-08 LAB — POCT I-STAT GLUCOSE
Glucose, Bld: 128 mg/dL — ABNORMAL HIGH (ref 70–99)
Operator id: 156951

## 2011-02-08 LAB — URINE MICROSCOPIC-ADD ON

## 2011-02-08 LAB — HEMOGLOBIN A1C: Hgb A1c MFr Bld: 7.9 % — ABNORMAL HIGH (ref <5.7)

## 2011-02-08 LAB — ABO/RH: ABO/RH(D): A POS

## 2011-02-08 LAB — MRSA PCR SCREENING: MRSA by PCR: NEGATIVE

## 2011-02-08 LAB — PROTIME-INR: INR: 1.28 (ref 0.00–1.49)

## 2011-04-04 NOTE — Assessment & Plan Note (Signed)
OFFICE VISIT   [name], [name]  DOB:  19-Aug-1956                                        Mar 31, 2010  CHART #:  [id number]   REASON FOR VISIT:  Followup after recent bypass surgery.   HISTORY OF PRESENT ILLNESS:  The patient is a 55 year old gentleman with  multiple cardiac risk factors.  He presented with left jaw and neck  discomfort with activity.  He was found to have three-vessel disease and  he underwent coronary bypass grafting x5 with a mammary and a radial on  March 03, 2010.  Postoperatively, his course was uncomplicated and he  went home on postoperative day #4.  Since then, he has been doing well.  He has had some back pain, but does not have any chest incisional pain.  He has noted a little swelling in his left vein harvest incision.  He  has not had any anginal-type symptoms.  He has been walking at least a  mile a day and states that on Saturday he went almost 2 miles without  any type of discomfort.  He has not been taking any narcotics.   CURRENT MEDICATIONS:  1. Imdur 30 mg daily.  2. Lopressor 50 mg b.i.d.  3. Actoplus Met 15/850 one tablet twice daily.  4. Wellbutrin 300 mg daily.  5. Lovaza 1 g b.i.d.  6. Lipitor 80 mg nightly.  7. Xanax 0.5 mg b.i.d. p.r.n.  8. Benicar 40/25 1/2 tablet daily.   PHYSICAL EXAMINATION:  General:  The patient is a 55 year old gentleman,  in no acute distress.  Vital Signs:  His blood pressure is 137/93, pulse  90, respirations 18, and oxygen saturation 97% on room air.  Lungs:  Clear with equal breath sounds bilaterally.  Cardiac:  Regular rate and  rhythm.  Normal S1 and S2.  No rubs, murmurs, or gallops.  Chest:  Sternum is stable.  Sternal incision is clean, dry, and intact.  His  radial incision is healing well without signs of infection.  Neurologic:  He has brisk capillary refill in his thumb and forefinger and it is  neurologically intact in his left arm.  He does have a small seroma at  the  incision in his left knee.  Vein was harvested from the right leg,  but not the left.  The left was explored, but not harvested.   IMPRESSION:  The patient is doing very well at this point in time.  He  is having minimal discomfort.  He does have some back pain, which may be  from compensating for his chest incision.  He has been taking Tylenol  for that.  It usually works adequately, but he is out of other pain  medication.  I gave him a prescription for hydrocodone 5/325 1-2 tablets  3 times daily as needed for pain.  I encouraged him to use Tylenol  unless it was severe and then use the hydrocodone.  I did caution him  using both the same time as both contain acetaminophen.   The patient was advised not to lift any objects weighing greater than 10  pounds for at least another 2 weeks.  He may continue to drive.  He has  already started that and has done fine with that.  He is still not to  drive on any long trips  or high speeds for another 2 weeks.  It was  cautioned not to drive while he is taking hydrocodone.  He will continue  to be followed.   Regarding his hypertension, I am going to increase him back to 1 tablet  daily on the Benicar hydrochlorothiazide.  He has only been taking half  a tablet and his diastolic pressure is elevated at 93.   He will continue to follow with Dr. Verdis Prime.  I will be happy to see  him back anytime if I can be of any further assistance with his care.   Salvatore Decent Dorris Fetch, M.D.  Electronically Signed   SCH/MEDQ  D:  03/31/2010  T:  03/31/2010  Job:  [id number]   cc:   Lyn Records, M.D.  Tally Joe, M.D.  Veverly Fells. Vernie Ammons, M.D.

## 2011-04-04 NOTE — Op Note (Signed)
NAME:  [name] [name], [name] [name] NO.:  [id number]   MEDICAL RECORD NO.:  [id number]          PATIENT TYPE:  AMB   LOCATION:  DAY                          FACILITY:  [facility]   PHYSICIAN:  [name], M.D.  DATE OF BIRTH:  1956-03-22   DATE OF PROCEDURE:  07/15/2008  DATE OF DISCHARGE:                               OPERATIVE REPORT   PREOPERATIVE DIAGNOSIS:  Urethral stricture.   POSTOPERATIVE DIAGNOSIS:  Urethral stricture.   PROCEDURES:  1. Cystoscopy.  2. Visual internal urethrotomy.  3. UroLume stent placement.   SURGEON:  [name], M.D.   ANESTHESIA:  General.   DRAINS:  None.   STENT LENGTH:  3 cm in the bulbar urethra.   BLOOD LOSS:  Minimal.   COMPLICATIONS:  None.   INDICATIONS:  The patient is a 55 year old male who has a history of  bulbar urethral stricture disease.  He underwent visual internal  urethrotomy with injection of steroids into the urethra in order to open  a significant bulbar urethral stricture on February 26, 2008.  He returned  to see me in the office, having noted significant slowing of his urinary  stream and was felt to have had recurrent stricture disease.  We  therefore discussed the treatment options including stricture excision  and primary reanastomosis, visual internal urethrotomy with daily  catheterization/dilation and UroLume stent placement.  I discussed the  benefits and compared each of these treatment options and he has elected  to proceed with UroLume stent.   DESCRIPTION OF OPERATION:  After informed consent, the patient brought  to the major OR, administered general anesthesia and then moved to the  dorsal lithotomy position.  Preoperatively he received Cipro 400 mg IV  and his preop urine culture was negative.  His genitalia was sterilely  prepped and draped and an official time-out was then performed.   Initially, the 22-French cystoscope with 12 degree lens was passed per  urethra and I was able to pass  this down to the deep bulbar urethral  region where the stricture was identified and photographed.  I passed a  0.038 inch floppy tip guidewire through this area and into the bladder  and then removed the cystoscope.  Alongside the guidewire the visual  internal urethrotomy scope with half moon blade was passed and under  direct visualization I incised the stricture at the 12 o'clock position.  There was a second area of stricturing slightly more proximal.  This was  incised as well and then I passed the scope on through the strictured  area and noted a good centimeter or more until I reached the external  sphincter.  I then passed the scope into the bladder and  the bladder  was noted to be free of any tumor, stones or inflammatory lesions.  Ureteral orifices normal configuration and position.   I then withdrew the scope and measured from the tip of the penis the  length of the stricture which measured approximately 2.5 cm.  I then  passed a 6-French open-ended catheter over the guidewire and used that  to  again measure and reconfirmed the length of the stricture which was  again found to be 2.5 cm.   I chose a 3 cm UroLume stent and passed the introducer under direct  vision into the urethra and into the area of the stricture.  I withdrew  the stent sheath with the stent placed just beyond the stricture  proximally but well beyond the external sphincter.  I then withdrew the  scope, leaving the stent in that location and deployed the stent.  Inspection revealed the stent to be in good position, spanning the  entire area of stricture and well away from the sphincter.  I therefore  removed the cystoscope and the patient was awakened and taken to the  recovery room in stable satisfactory condition.  He tolerated the  procedure well.  There were no intraoperative complications.   He will be given a prescription for Vicodin HP #24 and Cipro 250 mg  b.i.d. #6 and will follow up in my  office in 1-2 weeks.      [name], M.D.  Electronically Signed     MCO/MEDQ  D:  07/15/2008  T:  07/15/2008  Job:  [id number]

## 2011-04-04 NOTE — Op Note (Signed)
NAME:  [name] [name], [name]:  [id number]   MEDICAL RECORD NO.:  [id number]          PATIENT TYPE:  AMB   LOCATION:  [facility]                         FACILITY:  [facility]   PHYSICIAN:  [name], M.D.  DATE OF BIRTH:  1956/03/15   DATE OF PROCEDURE:  02/26/2008  DATE OF DISCHARGE:                               OPERATIVE REPORT   PREOPERATIVE DIAGNOSIS:  Urethral stricture.   POSTOPERATIVE DIAGNOSIS:  Urethral stricture.   PROCEDURE:  Cystoscopy with dilation, laser incision of urethral  stricture and Foley catheter placement.   SURGEON:  [name], M.D.   ANESTHESIA:  General.   DRAINS:  An 18-French Foley catheter.   SPECIMENS:  None.   BLOOD LOSS:  Minimal.   COMPLICATIONS:  None.   INDICATIONS:  The patient is a 55 year old white male who has a history  of urethral stricture in the past secondary to catheter trauma.  He has  noted slowing of his urinary stream for about a year now, and was found  cystoscopically in my office to have a pinpoint distal bulbar urethral  stricture.  He is brought to the OR for management of that.  We have  discussed the risks, complications, alternatives and the fact that the  scar could recur.  He understands and elects to proceed.   DESCRIPTION OF OPERATION:  After informed consent, patient brought to  the major OR, placed on the table, administered general anesthesia and  then moved to the dorsal lithotomy position.  His genitalia was  sterilely prepped and draped and an official time-out was performed.  The 21-French cystoscope was then introduced per urethra under direct  visualization with the 12 degrees lens.  The urethra was noted be normal  down to the proximal bulbar region where a pinpoint stricture was  identified.  I initially tried to pass a guidewire through this, but was  unsuccessful, so passed a 5-French open-ended ureteral catheter through  the scope and then was able to pass the guidewire through  the open-ended  catheter and through the opening and into the bladder.  I then left the  guidewire in place and removed the cystoscope.   The cystoscope was then passed next to the guidewire and I used a 550  micron holmium laser fiber to incise at the 12 o'clock position.  In  doing so, I was able to open up this and the stricture, but my concern  was that of the stricture's location relative to the sphincter.  I  therefore stopped before incising the stricture completely and removed  the cystoscope and used Heyman dilators over the guidewire to dilate  from 16-24-French.  I was then unable to pass the scope back down next  to the guidewire, and I could see that the stricture was well away from  the sphincter.  I then used the laser to open the stricture further at  the 12 o'clock position until good vascular tissue was identified.  I  then was able to pass the scope on into the bladder and inspected the  bladder.  The bladder was  noted to be free of any tumor, stones or  inflammatory lesions.  The ureteral orifices were normal in  configuration and position.  There was mild (1+) trabeculation.  I then  drained the bladder and reinspected the area of stricture.  It was noted  to widely patent.   I then passed the needle guide through the cystoscope and then passed  the cystoscopic injection needle through the guide and at various  locations along the 12 o'clock position, I injected a total of 2 mL of  Kenalog.  I then removed the cystoscope and inserted an 18-French Foley  catheter which was connected to close system drainage.  The patient was  awakened and taken to recovery room in stable satisfactory condition.  He tolerated the procedure well.  There were no intraoperative  complications.   He will be given a prescription for 28 Vicodin and remain on Cipro 250  mg b.i.d. #16.  I will then see him back in the office in 1 week for  catheter removal.      [name], M.D.   Electronically Signed     MCO/MEDQ  D:  02/26/2008  T:  02/26/2008  Job:  [id number]

## 2011-04-07 NOTE — Consult Note (Signed)
NAME:  [name] [name], [name] [name] NO.:  [id number]   MEDICAL RECORD NO.:  [id number]          PATIENT TYPE:  EMS   LOCATION:  ED                           FACILITY:  [facility]   PHYSICIAN:  Maretta Bees. Vonita Moss, M.D.DATE OF BIRTH:  1956-08-25   DATE OF CONSULTATION:  12/21/2004  DATE OF DISCHARGE:                                   CONSULTATION   I was called to the emergency room to evaluate this 55 year old white male  who gave a long history of a slow stream and he wonders whether it got worse  after he was catheterized a year ago when he was hospitalized for diabetes  and had some blood per urethra at that time. Over the last couple of weeks  he has had a little bit more trouble voiding, but since yesterday he has not  been able to void at all. In an attempted catheterization, Dr. Merita Norton  office was unsuccessful as in the emergency room here. I was called to see  him in that regard. He has had no history of prostatitis. He has had no  urethral or urinary surgery before. He denied any hematuria or dysuria  preceding this current event.   He has a history of diabetes and hypertension. His only previous surgery was  a T&A.   His medications include Hyzaar, Inderal, and Amaryl. Allergies to drugs are  denied.   He does not smoke. He drinks occasional alcohol.   FAMILY HISTORY:  Noncontributory.   REVIEW OF SYSTEMS:  Negative for chills, fever, cough, chest pain, nausea,  vomiting. He recently had a little blood per rectum.   PHYSICAL EXAMINATION:  He is a heavy set white male weighing 309 pounds with  a blood pressure of 180/115, pulse 98, temperature 98.4. Respiratory rate is  20. He is in some distress, but alert and oriented. Skin is warm and dry.  Heart tones are regular. Chest is clear. Abdomen is obese and I cannot feel  a palpable bladder for sure because of the obese abdomen. No inguinal  hernias or nodes are noted. Kidneys, urethra, meatus, Skene's were tested  with no deficits. Meatus without lesions. Perineum is normal. Sphincter tone  is normal. Prostate feels benign and small.  No seminal vesicle tissue  palpated.   I attempted to catheterize him. I believe he has a urethral stricture. He  voided somewhat after that and some relief with pressure and I discussed  options with him. He elected to have cysto and urethral dilatation under  anesthesia, which we will set up on an emergent basis.   IMPRESSION:  1.  Urinary retention.  2.  Suspected urethral stricture.      LJP/MEDQ  D:  12/21/2004  T:  12/21/2004  Job:  [id number]   cc:   Markham Jordan L. Effie Shy, M.D.  [address]  [place] [address]  Fax: [phone]   Tally Joe, M.D.  [address]  [place], [place] [address]  Fax: [phone]

## 2011-04-07 NOTE — Op Note (Signed)
NAME:  [name] [name], [name] [name] NO.:  [id number]   MEDICAL RECORD NO.:  [id number]          PATIENT TYPE:  EMS   LOCATION:  ED                           FACILITY:  [facility]   PHYSICIAN:  Maretta Bees. Vonita Moss, M.D.DATE OF BIRTH:  05-27-1956   DATE OF PROCEDURE:  12/21/2004  DATE OF DISCHARGE:                                 OPERATIVE REPORT   PREOPERATIVE DIAGNOSIS:  Urinary retention due to urethral stricture.   POSTOPERATIVE DIAGNOSIS:  Urinary retention due to urethral stricture plus  urethral meatal stenosis.   PROCEDURE:  Cystoscopy and dilatation of urethral meatal stenosis and  dilation of bulbous urethral stricture with filiforms and followers.   SURGEON:  Maretta Bees. Vonita Moss, M.D.   ANESTHESIA:  General.   INDICATIONS:  This 55 year old gentleman has had a long history of a small  urinary stream, and it has been worse over the last few days.  He presented  to the emergency room in urinary retention.  I was asked to see him in  consultation.  I tried to catheterize him.  He obviously probably had a  urethral stricture, and he opted for general anesthesia as an urgent measure  to correct the problem.   PROCEDURE:  Patient was brought to the operating room and placed in the  lithotomy position.  The external genitalia were prepped and draped in the  usual fashion.  The cystoscopy would not traverse the urethral meatus.  The  urethral meatus was dilated to 26 [place].  I then cystoscoped him, and in  the bulbous urethra, there was a pin-point urethral stricture, through which  I passed a metal guidewire, and over the metal guidewire dilated him with  Heyman dilators from 16 to 28 [place].  I then cystoscoped him.  He had about  a 1 cm bulbous urethral stricture, and the prostate was wide open.  The  bladder was unremarkable.  I then inserted an 61 French Foley catheter to  close drainage, which he will remove tomorrow morning.  He is then taken to  the recovery room in  good condition, having tolerated the procedure well  with negligible blood loss.      LJP/MEDQ  D:  12/21/2004  T:  12/21/2004  Job:  [id number]   cc:   Tally Joe, M.D.  [address]  [place], [place] [address]  Fax: [phone]

## 2011-04-07 NOTE — Discharge Summary (Signed)
NAME:  [name] [name], [name] [name] NO.:  [id number]   MEDICAL RECORD NO.:  [id number]                   PATIENT TYPE:  INP   LOCATION:  6706                                 FACILITY:  [facility]   PHYSICIAN:  Corinna L. Lendell Caprice, MD             DATE OF BIRTH:  07/09/1956   DATE OF ADMISSION:  08/01/2003  DATE OF DISCHARGE:  08/06/2003                                 DISCHARGE SUMMARY   DISCHARGE DIAGNOSES:  1. Diabetic ketoacidosis.  2. Pancreatitis.  3. Hypertriglyceridemia.  4. Hypercholesterolemia.  5. Hypertension.  6. Hypokalemia.  7. Increased liver function tests.   DISCHARGE MEDICATIONS:  1. Amaryl 6 mg p.o. q.d.  2. Percocet one to two p.o. q.4h. p.r.n.  3. Laxative of choice p.r.n.  4. He may continue his other medications which include Inderal, Hyzaar,     Lipitor, and Wellbutrin XR.   DIET:  ADA low fat.   ACTIVITY:  Ad-lib.   FOLLOW UP:  He is to follow up with his primary care physician, Dr.  Francesca Oman, in a week for blood sugar checks. Once his pancreatitis is  completely resolved, he will also need his liver function tests repeated as  well as fasting lipids with emphasis on triglyceride measurements.   CONDITION ON DISCHARGE:  Stable.   PERTINENT LABORATORY DATA:  His UA showed greater than 80 ketones, 30  proteins, negative nitrites or leukocyte esterase, moderate bilirubin, trace  hemoglobin, greater than 100,000 glucose. Repeat UA was negative for  nitrites or leukocyte esterase. Upon admission, his cholesterol was 966,  triglycerides were 6,793, HDL was 7, but on September 14, the fasting lipids  were repeated, and his total cholesterol had dropped to 303, triglycerides  had dropped to 403, and HDL was 25. His basic metabolic panel upon admission  was significant for a glucose of 416, bicarbonate was 13, chloride was 94,  sodium 131, potassium 4.9, BUN was 15, creatinine was 1.6. His AST was 51,  ALT was 45, total bilirubin  was 2.7. At discharge, his sodium was 136, his  potassium was 3.4, bicarbonate 23, glucose 180, BUN 8, creatinine 1.0, and  his bilirubin had normalized. His lipase on admission was 1,575. At  discharge, it was 28. Amylase on admission was 763; at discharge it was 60.  Hemoglobin A1c was 13.1. His serum acetone was moderate. His CBC was  essentially normal.   SPECIAL STUDIES AND RADIOLOGY:  Chest x-ray showed nothing acute. Abdominal  ultrasound showed diffuse fatty infiltration of the liver, small gallbladder  polyp, borderline splenic enlargement, normal kidneys, nonvisualization of  the pancreas and aorta. EKG showed normal sinus rhythm with poor R-wave  progression. Acute abdominal series showed nonspecific bowel gas pattern.   HISTORY AND HOSPITAL COURSE:  [name] [name] is a 55 year old white male who  presented with polyuria, polydipsia, nausea, anorexia, weight loss. He had  normal vital signs and a benign  abdominal exam. On laboratories, he was  found to be in DKA. He was admitted for treatment of a DKA. A lipase was  checked which showed evidence of pancreatitis. Ultrasound ruled out  gallstone pancreatitis. The patient was started on IV insulin, kept NPO, had  serial electrolytes, and lipase measurements. He was given IV fluids,  antiemetics, and pain medications. His DKA resolved, and his pancreatitis  improved. At the time of discharge, he was tolerating a solid diet. The day  prior to discharge, however, he began having acute onset of left flank and  left abdominal pain which was different in character than his pancreatitis  pain. He was a little tender in the left abdomen. UA was negative, and  therefore, pyelonephritis was unlikely. Also, acute abdominal series showed  no signs of obstruction. He was complaining of constipation, and I suspect  his pain may be from this. His triglyceride levels were extremely high, and  initially there was concern that this could have caused his  pancreatitis;  however, his triglyceride level has come down with his resolving  pancreatitis. I suspect, therefore, that his hypertriglyceridemia may have  been due to the pancreatitis rather than vice versa. He will, however, need  to have his fasting lipids checked again to evaluate this further. If still  high, he will need to be started on a triglyceride lower agent. He was given  diabetic teaching. At the time of discharge, his sugars were in the 150 to  200 range, and he was started on Amaryl which may need to be adjusted  depending on his Accu-Checks.                                                Corinna L. Lendell Caprice, MD    CLS/MEDQ  D:  08/06/2003  T:  08/08/2003  Job:  [id number]   cc:   Meredith Staggers, M.D.  [address], [address]  [place]  [place] [address]  Fax: [phone]

## 2011-04-07 NOTE — H&P (Signed)
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  He reports that over the  last month he has lost about 30 pounds in weight without dieting.  For three  weeks, he has noted increased thirst, frequent urination and nocturia about  10 times each night.  For about three to four days he has been very  anorectic and nauseated and for 24 hours he has noted profound midepigastric  pain which seems to radiate through to the back.  He presented to the [facility] with these complaints.  He was noted to have a glucose  of 416, potassium of 4.9, CO2 13, creatinine 1.6.  He was also noted to have  a bilirubin of 2.7 with SGOT of 51 and normal SGPT and a normal alkaline  phosphatase.  Abdominal ultrasound was done that showed fatty liver, but no  other abnormalities.  He was admitted at this time for treatment of diabetic  ketoacidosis and abdominal pain which is initially presumed to be secondary  to gastritis.   PAST MEDICAL HISTORY:   MEDICATIONS:  1. The patient takes Inderal.  He is not sure about the dose.  2. Hyzaar 50 mg daily.  3. Lipitor 10 mg daily.  4. Wellbutrin XR 150 mg daily.   ILLNESSES:  The patient states he has had no significant illnesses in the  past with the exception of hypertension which has been present for about two  years.  He says that he is compliant with his blood pressure medication. He  has also been diagnosed with hyperlipidemia.  I do not how often he takes  the Lipitor medication.   He says that he cannot remember having his  cholesterol checked since he has been taking it.  He also takes Wellbutrin  for treatment of depression.   OPERATION:  None.   HOSPITALIZATIONS:  None.   FAMILY HISTORY:  The patient has a sister who has hyperlipidemia.  His  father died of ruptured aneurysm and also had a coronary artery bypass  graft.  His mother died of lung cancer.   SOCIAL HISTORY:  He says he drinks two to three beers per day.  In the last  month he says that he has drank very little.  He denies smoking or drug  abuse.   REVIEW OF SYSTEMS:  HEAD:  He denies headache or dizziness.  EYES:  He has  noted some blurring of his vision.  ENT:  Denies earache, sinus pain or sore  throat.  CHEST:  Denies coughing, wheezing or chest congestion.  CARDIOVASCULAR:  Denies orthopnea, PND, ankle edema or exertional chest  pain. GI:  Denies hematemesis, melena, or hematochezia.  Otherwise, see  above.  GU:  Denies dysuria, hematuria or pyuria.  Otherwise, see above.  NEURO:  Denies history of seizure or stroke.  ENDO:  See above.   PHYSICAL EXAMINATION:  GENERAL:  This is an obese gentleman who appears  dehydrated and is in moderate distress.  VITAL SIGNS:  Temperature 98.2, blood pressure 125/94, pulse 96,  respirations 20, O2 saturation is 96%.  HEENT:  Remarkable for very dry lips and mucous membranes.  CHEST:  Clear to auscultation and percussion.  BACK:  Reveals no CVA or point tenderness.  CARDIOVASCULAR:  Normal S1, S2.  There are no rubs, murmurs or gallops.  ABDOMEN:  Normal bowel sounds.  There is no masses or tenderness.  No  guarding or rebound.  NEUROLOGIC:  Examination of the extremities is normal.   IMPRESSION:  1. Diabetic ketoacidosis.  2. Abdominal pain, possibly secondary to gastritis.  Rule out pancreatitis.  3. Hypertension.  4. Obesity.  5. Hyperlipidemia.  6. Depression.   PLAN:  Will admit the patient for treatment of diabetic ketoacidosis.  Will   initiate intravenous hydration and will add potassium to the regimen and  follow his electrolytes closely.  Intravenous insulin will be administered  via the Glucomander protocol.  The patient will be given empiric Protonix  therapy because of presume gastritis.  Amylase will be monitored.  Diabetic  instruction will be initiated in both diet and home glucose monitoring.  The  patient will remain on his blood pressure medications. Ultimately, he will  follow up with Dr. Dayton Scrape to fine tune his diabetic regulation.                                                 Sherin Quarry, MD    SY/MEDQ  D:  08/01/2003  T:  08/02/2003  Job:  [id number]   cc:   Meredith Staggers, M.D.  [address]  [place]  [place] [address]  Fax: [phone]

## 2011-07-06 ENCOUNTER — Other Ambulatory Visit (HOSPITAL_COMMUNITY): Payer: 59

## 2011-08-01 ENCOUNTER — Other Ambulatory Visit (HOSPITAL_COMMUNITY): Payer: 59

## 2011-08-02 ENCOUNTER — Other Ambulatory Visit: Payer: Self-pay | Admitting: Urology

## 2011-08-02 ENCOUNTER — Other Ambulatory Visit (HOSPITAL_COMMUNITY): Payer: Self-pay | Admitting: Urology

## 2011-08-02 ENCOUNTER — Encounter (HOSPITAL_COMMUNITY): Payer: 59

## 2011-08-02 ENCOUNTER — Ambulatory Visit (HOSPITAL_COMMUNITY)
Admission: RE | Admit: 2011-08-02 | Discharge: 2011-08-02 | Disposition: A | Discharge status: Home / Self Care | Payer: 59 | Source: Ambulatory Visit | Attending: Urology | Admitting: Urology

## 2011-08-02 DIAGNOSIS — Z01812 Encounter for preprocedural laboratory examination: Secondary | ICD-10-CM | POA: Insufficient documentation

## 2011-08-02 DIAGNOSIS — N135 Crossing vessel and stricture of ureter without hydronephrosis: Secondary | ICD-10-CM

## 2011-08-02 DIAGNOSIS — Z01818 Encounter for other preprocedural examination: Secondary | ICD-10-CM | POA: Insufficient documentation

## 2011-08-02 DIAGNOSIS — I10 Essential (primary) hypertension: Secondary | ICD-10-CM | POA: Insufficient documentation

## 2011-08-02 DIAGNOSIS — E119 Type 2 diabetes mellitus without complications: Secondary | ICD-10-CM | POA: Insufficient documentation

## 2011-08-02 LAB — BASIC METABOLIC PANEL
BUN: 19 mg/dL (ref 6–23)
CO2: 26 mEq/L (ref 19–32)
Calcium: 9.4 mg/dL (ref 8.4–10.5)
Chloride: 98 mEq/L (ref 96–112)
Creatinine, Ser: 1.26 mg/dL (ref 0.50–1.35)
GFR calc Af Amer: >60 mL/min (ref >60)
GFR calc non Af Amer: 60 mL/min — ABNORMAL LOW (ref >60)
Glucose, Bld: 89 mg/dL (ref 70–99)
Potassium: 3.8 mEq/L (ref 3.5–5.1)
Sodium: 136 mEq/L (ref 135–145)

## 2011-08-02 LAB — SURGICAL PCR SCREEN
MRSA, PCR: NEGATIVE
Staphylococcus aureus: NEGATIVE

## 2011-08-02 LAB — CBC
HCT: 40.1 % (ref 39.0–52.0)
Hemoglobin: 13.4 g/dL (ref 13.0–17.0)
MCH: 28.2 pg (ref 26.0–34.0)
MCHC: 33.4 g/dL (ref 30.0–36.0)
MCV: 84.4 fL (ref 78.0–100.0)
Platelets: 183 10*3/uL (ref 150–400)
RBC: 4.75 MIL/uL (ref 4.22–5.81)
RDW: 13.5 % (ref 11.5–15.5)
WBC: 8.4 10*3/uL (ref 4.0–10.5)

## 2011-08-02 IMAGING — CR DG CHEST 2V
2 series · 2 of 2 positions shown · non-contrast
Comparison: Chest x-ray of [DATE]

CLINICAL DATA: Preop for ureteral stricture surgery

CHEST - 2 VIEW

[w chest pa]
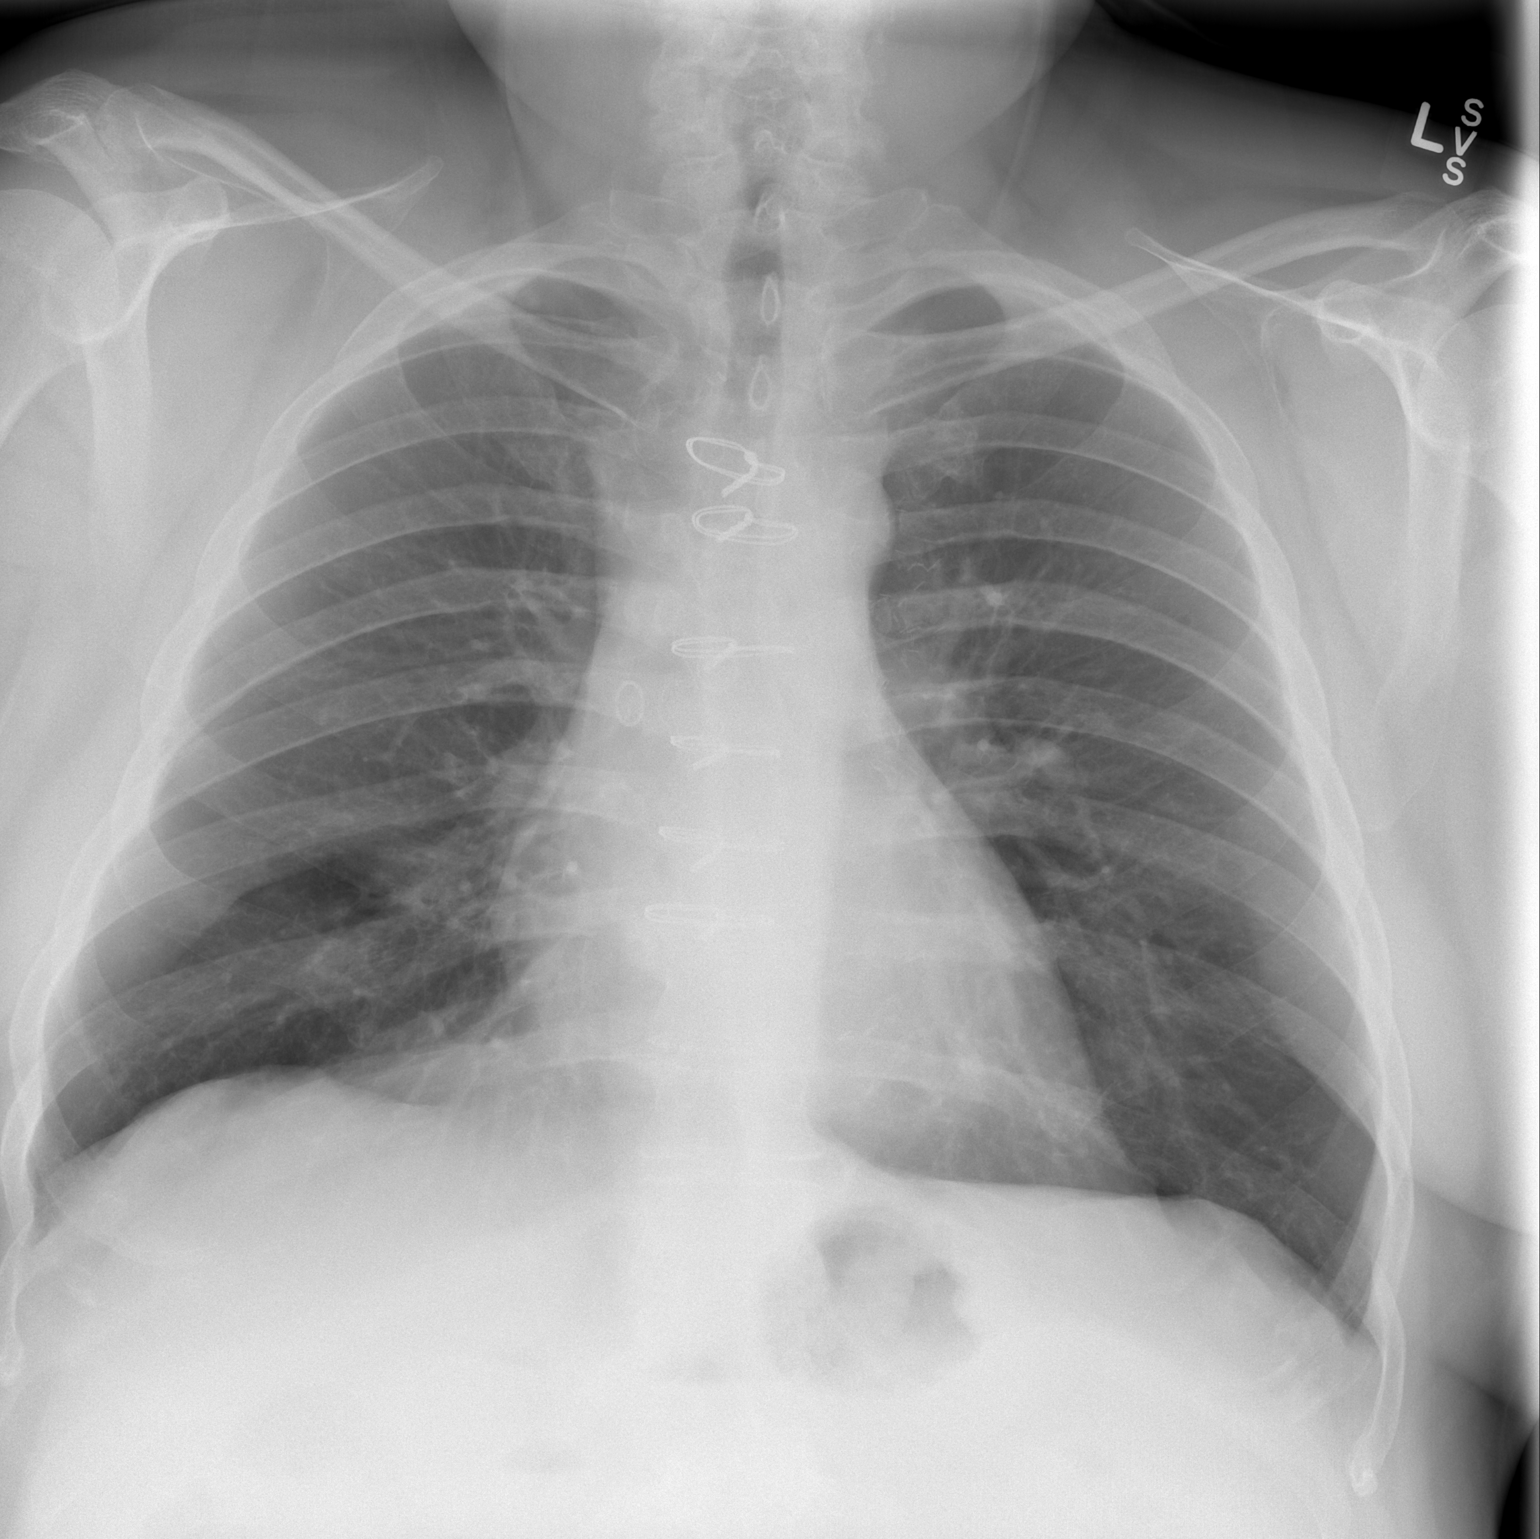

[w chest lat]
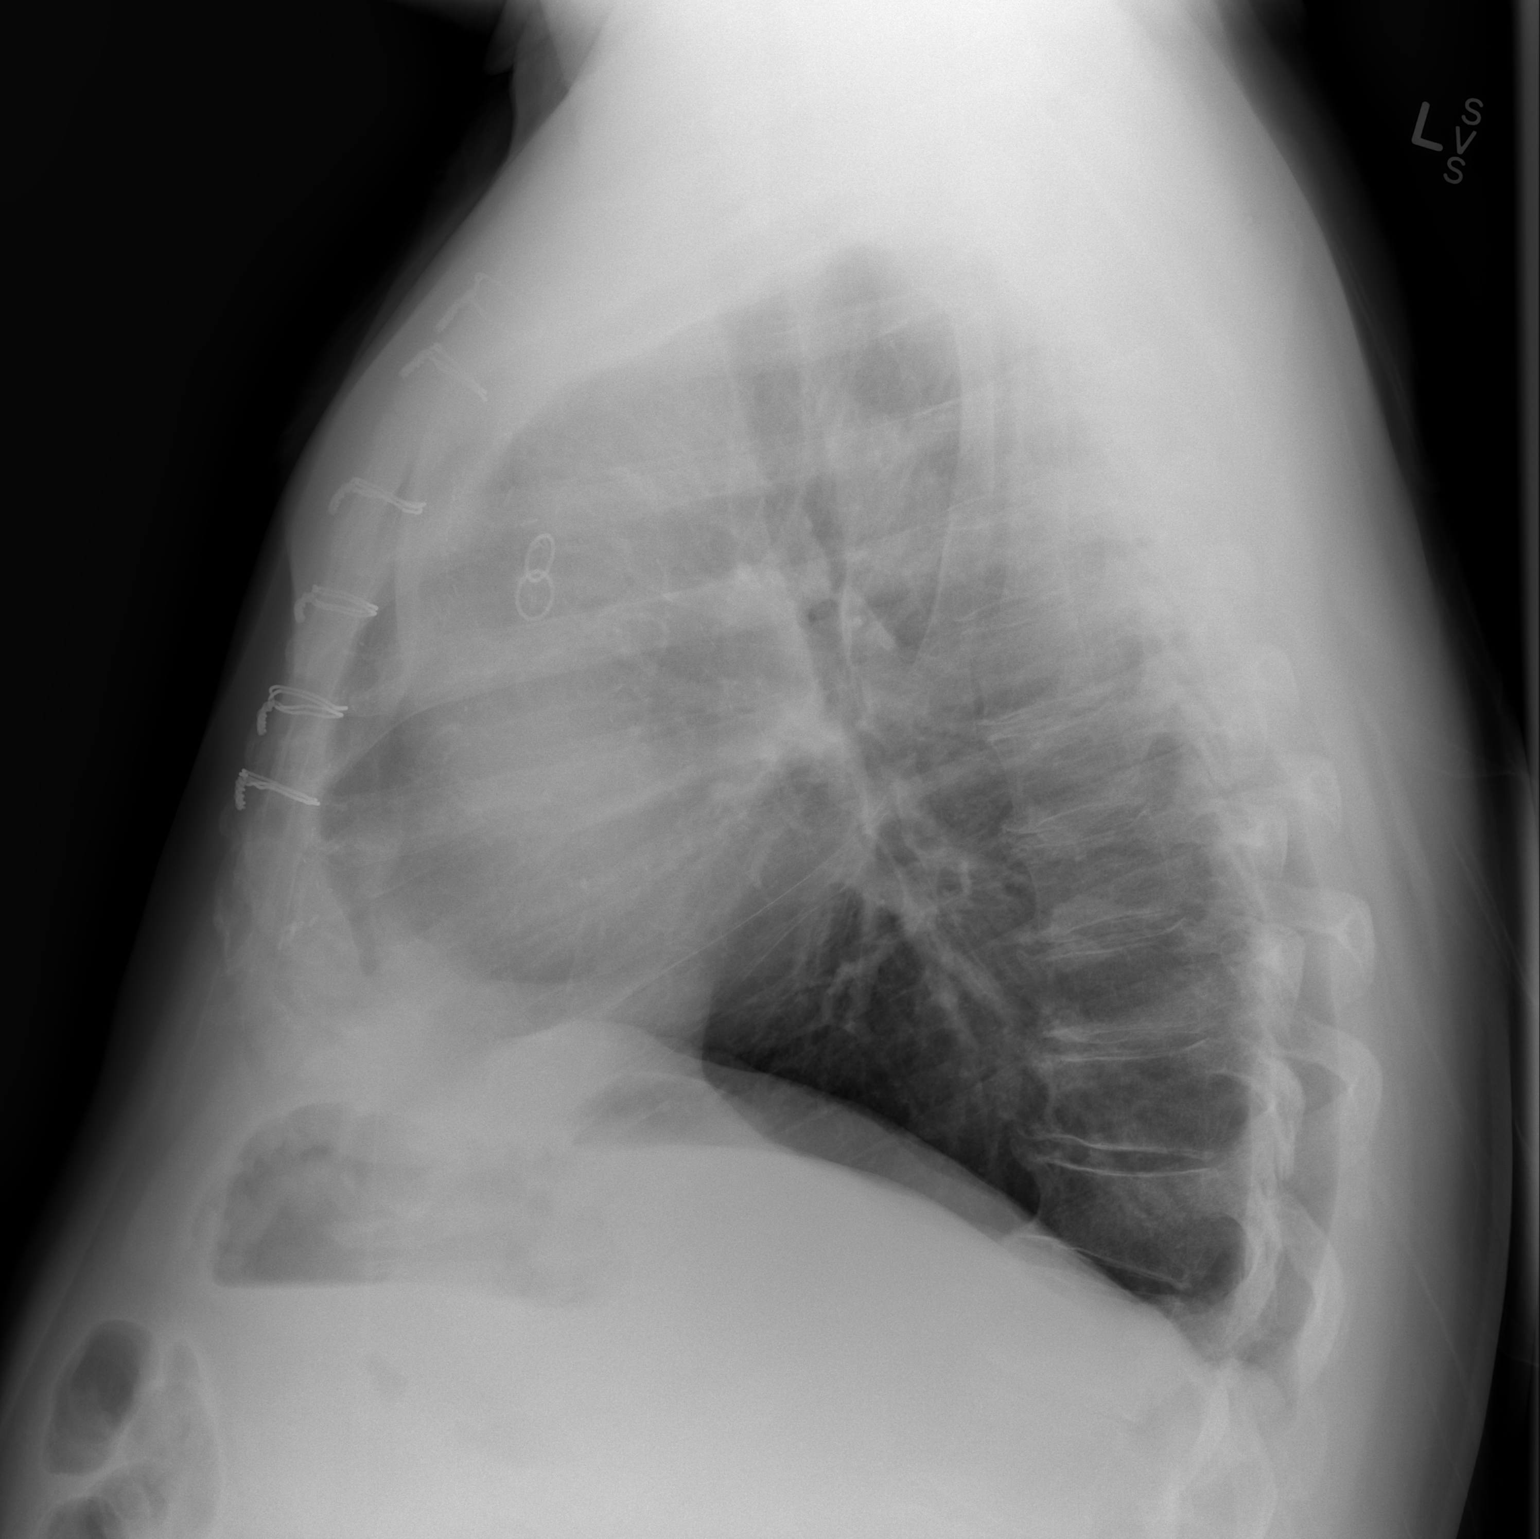

[2 of 2 positions shown; findings below may reference images not displayed]

FINDINGS: No active infiltrate or effusion is seen. The lungs
remain slightly hyperaerated.  The heart is mildly enlarged and
stable.  Median sternotomy sutures are noted from prior CABG.  No
bony abnormality is seen.
IMPRESSION: No active lung disease.  Slight hyperaeration.

## 2011-08-04 ENCOUNTER — Ambulatory Visit (HOSPITAL_COMMUNITY)
Admission: RE | Admit: 2011-08-04 | Discharge: 2011-08-04 | Disposition: A | Discharge status: Home / Self Care | Payer: 59 | Source: Ambulatory Visit | Attending: Urology | Admitting: Urology

## 2011-08-04 DIAGNOSIS — E669 Obesity, unspecified: Secondary | ICD-10-CM | POA: Insufficient documentation

## 2011-08-04 DIAGNOSIS — Z01812 Encounter for preprocedural laboratory examination: Secondary | ICD-10-CM | POA: Insufficient documentation

## 2011-08-04 DIAGNOSIS — E119 Type 2 diabetes mellitus without complications: Secondary | ICD-10-CM | POA: Insufficient documentation

## 2011-08-04 DIAGNOSIS — N35919 Unspecified urethral stricture, male, unspecified site: Secondary | ICD-10-CM | POA: Insufficient documentation

## 2011-08-04 DIAGNOSIS — I10 Essential (primary) hypertension: Secondary | ICD-10-CM | POA: Insufficient documentation

## 2011-08-04 DIAGNOSIS — Z01818 Encounter for other preprocedural examination: Secondary | ICD-10-CM | POA: Insufficient documentation

## 2011-08-04 LAB — GLUCOSE, CAPILLARY
Glucose-Capillary: 174 mg/dL — ABNORMAL HIGH (ref 70–99)
Glucose-Capillary: 137 mg/dL — ABNORMAL HIGH (ref 70–99)

## 2011-08-08 NOTE — Op Note (Signed)
NAME:  [name] [name], [name] [name] NO.:  [id number]  MEDICAL RECORD NO.:  [id number]  LOCATION:  DAYL                         FACILITY:  [facility]  PHYSICIAN:  Jariel Drost C. Vernie Ammons, M.D.  DATE OF BIRTH:  1956-02-11  DATE OF PROCEDURE:  08/04/2011 DATE OF DISCHARGE:  08/04/2011                              OPERATIVE REPORT   DATE OF OPERATION:  August 04, 2011.  PREOPERATIVE DIAGNOSIS:  Urethral stricture.  POSTOPERATIVE DIAGNOSIS:  Urethral stricture.  PROCEDURE: 1. Cystoscopy. 2. Retrograde urethrogram with interpretation. 3. Dilatation of urethral stricture. 4. Placement of UroLume stent.  SURGEON:  Konner Warrior C. Vernie Ammons, MD  ANESTHESIA:  General.  SPECIMENS:  None.  BLOOD LOSS:  Minimal.  DRAINS:  None.  COMPONENTS:  UroLume stent 1.5-cm in the proximal bulbar region.  COMPLICATIONS:  None.  INDICATIONS:  The patient is a 55 year old male who underwent treatment of a deep bulbar urethral stricture with visual internal urethrotomy and injection of steroids in 02/2008 to try to prevent re-formation of the stricture; however in 06/2008, he had redeveloped a stricture and therefore underwent a UroLume stent placement.  He then had to have urethral dilatation with filiforms and followers due to an unrelated surgery in 02/2010.  I saw him in the office in 05/2011 and cystoscopically noted a pinpoint urethral stricture in the deep bulbar region proximal to his previous UroLume stent location and have recommended evaluation with a retrograde urethrogram as well as dilatation versus an incision of the stricture and consideration of a UroLume stent in that location as well.  The procedure, its risks and complications as well as alternatives were discussed.  The patient understands and has elected to proceed.  DESCRIPTION OF OPERATION:  After informed consent, the patient was brought to the major OR, placed on table, administered general anesthesia and then moved to the  dorsal lithotomy position.  His genitalia was sterilely prepped and draped and official time-out was then performed.  Initially the 22-French cystoscope was passed under direct vision down the urethra which was noted to be normal down to an area just distal to the previously placed UroLume stent where a mild stricture was noted but I was able to negotiate the scope through this and I do not think that this was clinically significant.  I was then able to identify his pinpoint urethral stricture and this was photographed.  I then removed the cystoscope and performed a retrograde urethrogram.  The retrograde urethrogram was performed in the standard fashion by placing a 16-French Foley catheter in the distal urethra and inflating the balloon with 2 mL of sterile saline in the fossa navicularis.  I then injected full strength contrast under direct fluoroscopy and noted the urethra to be normal with the small strictured area identified in the deep bulbar region fairly close to the external sphincter.  The prostatic urethra and bladder appeared normal on fluoroscopic images.  The cystoscope was then reinserted and I passed a 0.03-inch floppy tip guidewire through the stricture and into the bladder and confirmed its position fluoroscopically.  I then used the Hegar dilators to dilate over the guidewire starting at 16-French and easily dilating up to 70- [place].  I  then repeated cystoscopy and noted that the stricture was widely dilated and this allowed me to determine that it was well beyond the external sphincter and would be amenable to the UroLume stent placement without undue concern for disturbance of the external sphincter function.  I advanced the scope on into the bladder and inspected the bladder and noted it to be free of any tumor, stones or inflammatory lesions.  Ureteral orifices were of normal configuration and position and the prostatic urethra was nonobstructing as well.  I  removed the cystoscope and the guidewire and inserted the UroLume stent and chose a 1.5-cm stent.  I withdrew the scope back through the external sphincter and noted the sphincter coapting.  This was proximal to where I was planning to place the UroLume stent and therefore I positioned it in the deep bulbar region across the strictured area and deployed the UroLume stent.  As I withdrew the scope, a photograph was taken and then the cystoscope was removed.  The patient was awakened and taken to recovery room in stable and satisfactory condition.  He tolerated the procedure well with no intraoperative complications.  He will be given a prescription for Vicodin HP #24 and be maintained on Cipro 250 mg b.i.d. for 5 days with followup in my office in 1 week.     Celeste Tavenner C. Vernie Ammons, M.D.     MCO/MEDQ  D:  08/04/2011  T:  08/04/2011  Job:  [id number]  Electronically Signed by [name] [name] M.D. on 08/08/2011 04:44:50 AM

## 2011-08-15 LAB — POCT I-STAT 4, (NA,K, GLUC, HGB,HCT)
Glucose, Bld: 197 — ABNORMAL HIGH
HCT: 41
Hemoglobin: 13.9
Operator id: 268271
Potassium: 4.2
Sodium: 134 — ABNORMAL LOW

## 2013-04-22 ENCOUNTER — Encounter (HOSPITAL_COMMUNITY): Payer: Self-pay | Admitting: *Deleted

## 2013-04-22 ENCOUNTER — Emergency Department (HOSPITAL_COMMUNITY): Payer: 59

## 2013-04-22 ENCOUNTER — Emergency Department (HOSPITAL_COMMUNITY)
Admission: EM | Admit: 2013-04-22 | Discharge: 2013-04-22 | Disposition: A | Discharge status: Home / Self Care | Payer: 59 | Attending: Emergency Medicine | Admitting: Emergency Medicine

## 2013-04-22 DIAGNOSIS — Y9389 Activity, other specified: Secondary | ICD-10-CM | POA: Insufficient documentation

## 2013-04-22 DIAGNOSIS — S92919A Unspecified fracture of unspecified toe(s), initial encounter for closed fracture: Secondary | ICD-10-CM | POA: Insufficient documentation

## 2013-04-22 DIAGNOSIS — S92502B Displaced unspecified fracture of left lesser toe(s), initial encounter for open fracture: Secondary | ICD-10-CM

## 2013-04-22 DIAGNOSIS — S92912A Unspecified fracture of left toe(s), initial encounter for closed fracture: Secondary | ICD-10-CM

## 2013-04-22 DIAGNOSIS — W2203XA Walked into furniture, initial encounter: Secondary | ICD-10-CM | POA: Insufficient documentation

## 2013-04-22 DIAGNOSIS — Z951 Presence of aortocoronary bypass graft: Secondary | ICD-10-CM | POA: Insufficient documentation

## 2013-04-22 DIAGNOSIS — IMO0002 Reserved for concepts with insufficient information to code with codable children: Secondary | ICD-10-CM

## 2013-04-22 DIAGNOSIS — Y92009 Unspecified place in unspecified non-institutional (private) residence as the place of occurrence of the external cause: Secondary | ICD-10-CM | POA: Insufficient documentation

## 2013-04-22 DIAGNOSIS — I2510 Atherosclerotic heart disease of native coronary artery without angina pectoris: Secondary | ICD-10-CM | POA: Insufficient documentation

## 2013-04-22 DIAGNOSIS — Z7982 Long term (current) use of aspirin: Secondary | ICD-10-CM | POA: Insufficient documentation

## 2013-04-22 DIAGNOSIS — S92919B Unspecified fracture of unspecified toe(s), initial encounter for open fracture: Secondary | ICD-10-CM | POA: Insufficient documentation

## 2013-04-22 DIAGNOSIS — Z79899 Other long term (current) drug therapy: Secondary | ICD-10-CM | POA: Insufficient documentation

## 2013-04-22 HISTORY — DX: Atherosclerotic heart disease of native coronary artery without angina pectoris: I25.10

## 2013-04-22 IMAGING — CR DG TOE 2ND 2+V*L*
4 series · 4 of 4 positions shown · non-contrast
Comparison: None.

CLINICAL DATA: Toe injury.

LEFT SECOND TOE

[x toes ap left]
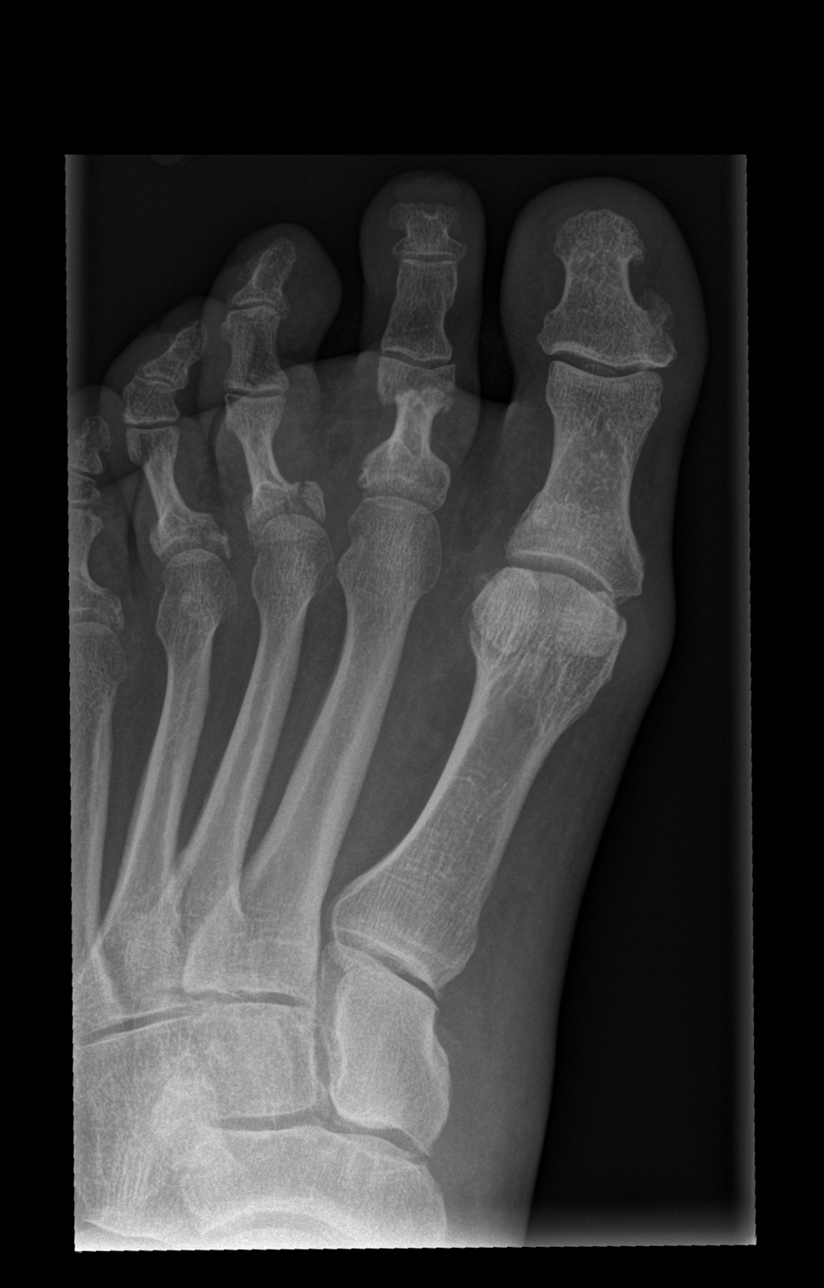

[x toes obl left (1 of 2)]
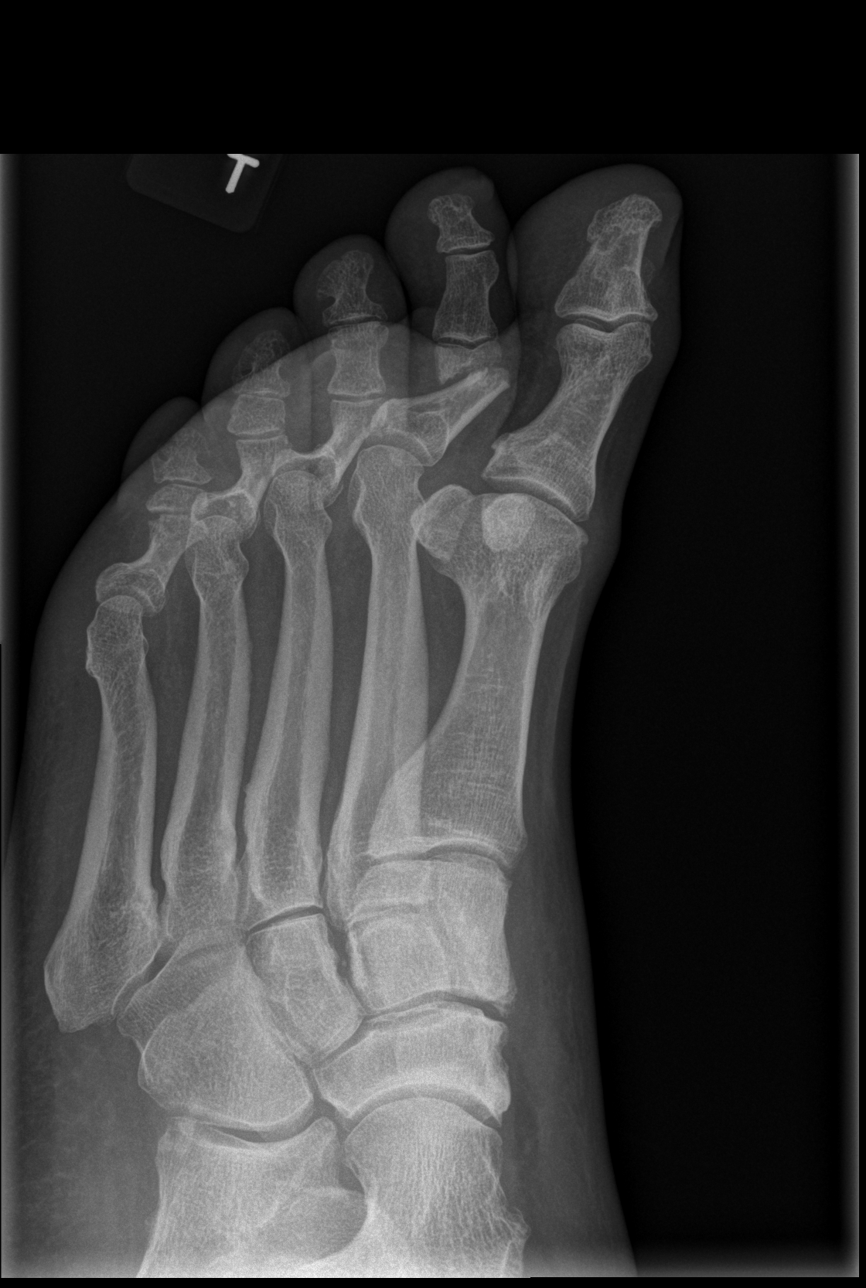

[x toes obl left (2 of 2)]
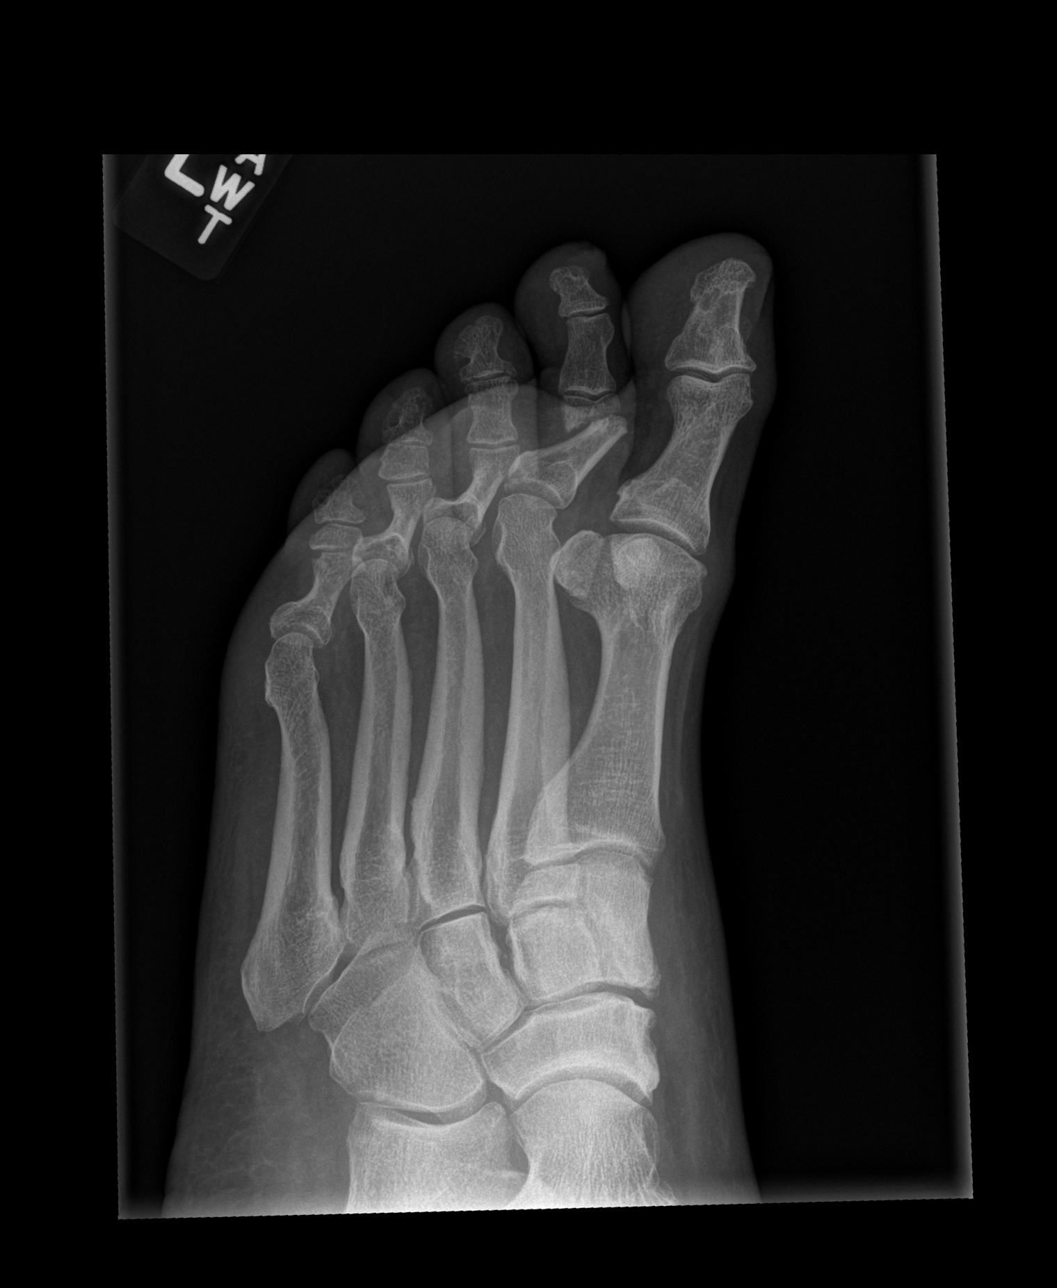

[x toes lat left]
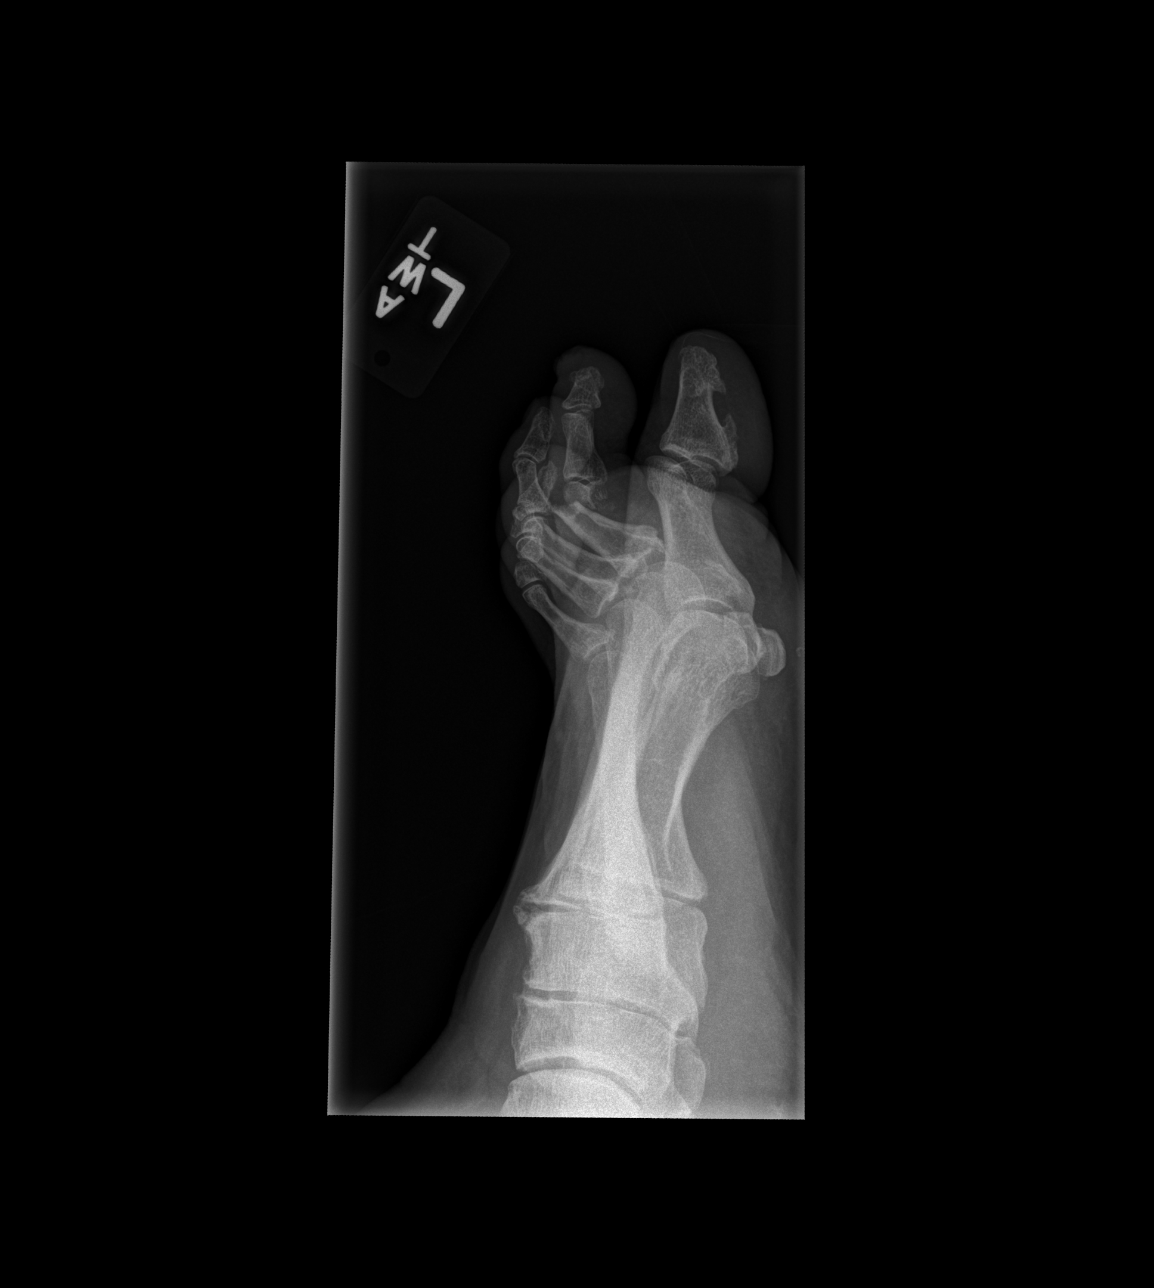

[4 of 4 positions shown; findings below may reference images not displayed]

FINDINGS: First MTP joint osteoarthritis.  Great toe appears
intact.  There is a transverse fracture of the distal shaft of the
proximal phalanx of the second toe which is mildly displaced and
angulated.  Additionally, there are suboptimally seen fractures of
the bases of the proximal phalanges of the third and fourth toes.
Visualized portions of the fifth toe appear intact.  Middle
phalanges and terminal phalanges appear intact throughout.
IMPRESSION: Fractures of the proximal phalanges of the second, third, and
fourth toes.  No significant displacement and angulation is in the
transverse distal fracture of the proximal phalanx of the left
second toe.

## 2013-04-22 MED ORDER — HYDROCODONE-ACETAMINOPHEN 5-325 MG PO TABS
1.0000 | ORAL_TABLET | Freq: Once | ORAL | Status: AC
  Administered 2013-04-22: 1 via ORAL
  Filled 2013-04-22: qty 1

## 2013-04-22 MED ORDER — HYDROCODONE-ACETAMINOPHEN 5-325 MG PO TABS: 1.0000 | ORAL_TABLET | ORAL | Status: DC | PRN

## 2013-04-22 MED ORDER — CEPHALEXIN 500 MG PO CAPS: 500.0000 mg | ORAL_CAPSULE | Freq: Four times a day (QID) | ORAL | Status: DC

## 2013-04-22 MED ORDER — CEFAZOLIN SODIUM 1-5 GM-% IV SOLN
1.0000 g | Freq: Once | INTRAVENOUS | Status: AC
  Administered 2013-04-22: 1 g via INTRAVENOUS
  Filled 2013-04-22: qty 50

## 2013-04-22 MED ORDER — BUPIVACAINE HCL (PF) 0.5 % IJ SOLN
10.0000 mL | Freq: Once | INTRAMUSCULAR | Status: AC
  Administered 2013-04-22: 10 mL
  Filled 2013-04-22: qty 30

## 2013-04-22 NOTE — ED Provider Notes (Signed)
History     CSN: [id number]  Arrival date & time 04/22/13  1932   First MD Initiated Contact with Patient 04/22/13 1954      Chief Complaint  Patient presents with  . Toe Injury    (Consider location/radiation/quality/duration/timing/severity/associated sxs/prior treatment) The history is provided by the patient.    Past Medical History  Diagnosis Date  . CAD (coronary artery disease)     Past Surgical History  Procedure Laterality Date  . Coronary artery bypass graft      History reviewed. No pertinent family history.  History  Substance Use Topics  . Smoking status: Not on file  . Smokeless tobacco: Not on file  . Alcohol Use: Not on file      Review of Systems  Constitutional: Negative for fever and chills.  Respiratory: Negative for cough.   Gastrointestinal: Negative for nausea.  Skin: Positive for wound.  Neurological: Negative for numbness.  All other systems reviewed and are negative.    Allergies  Review of patient's allergies indicates no known allergies.  Home Medications   Current Outpatient Rx  Name  Route  Sig  Dispense  Refill  . ALPRAZolam (XANAX) 0.5 MG tablet   Oral   Take 0.5 mg by mouth 2 (two) times daily as needed for anxiety.         Marland Kitchen aspirin 325 MG tablet   Oral   Take 325 mg by mouth daily.         Marland Kitchen atorvastatin (LIPITOR) 80 MG tablet   Oral   Take 80 mg by mouth daily.         Marland Kitchen buPROPion (WELLBUTRIN XL) 300 MG 24 hr tablet   Oral   Take 300 mg by mouth daily.         . Liraglutide (VICTOZA) 18 MG/3ML SOPN   Subcutaneous   Inject 1.8 mg into the skin daily.         . metoprolol (LOPRESSOR) 50 MG tablet   Oral   Take 50 mg by mouth 2 (two) times daily.         Marland Kitchen olmesartan-hydrochlorothiazide (BENICAR HCT) 40-25 MG per tablet   Oral   Take 1 tablet by mouth daily.         . sildenafil (VIAGRA) 100 MG tablet   Oral   Take 100 mg by mouth daily as needed for erectile dysfunction.         .  cephALEXin (KEFLEX) 500 MG capsule   Oral   Take 1 capsule (500 mg total) by mouth 4 (four) times daily.   20 capsule   0   . HYDROcodone-acetaminophen (NORCO/VICODIN) 5-325 MG per tablet   Oral   Take 1 tablet by mouth every 4 (four) hours as needed for pain.   30 tablet   0     BP 145/85  Pulse 96  Temp(Src) 97.8 F (36.6 C) (Oral)  Resp 18  Ht 6\' 1"  (1.854 m)  Wt 270 lb (122.471 kg)  BMI 35.63 kg/m2  SpO2 99%  Physical Exam  Nursing note and vitals reviewed. Constitutional: He is oriented to person, place, and time. He appears well-developed and well-nourished.  Eyes: Pupils are equal, round, and reactive to light.  Neck: Normal range of motion.  Cardiovascular: Normal rate and regular rhythm.   Pulmonary/Chest: Effort normal.  Musculoskeletal: He exhibits tenderness. He exhibits no edema.       Feet:  .5CM laceration oopen fracture   Bone visible  Neurological: He is alert and oriented to person, place, and time.  Skin: Skin is warm.    ED Course  LACERATION REPAIR Date/Time: 04/22/2013 11:41 PM Performed by: Arman Filter Authorized by: Arman Filter Consent: Verbal consent obtained. Risks and benefits: risks, benefits and alternatives were discussed Consent given by: patient Patient identity confirmed: verbally with patient Body area: lower extremity Location details: left second toe Laceration length: 0.5 cm Foreign bodies: no foreign bodies Tendon involvement: none Nerve involvement: none Vascular damage: no Anesthesia: digital block Local anesthetic: bupivacaine 0.5% with epinephrine Anesthetic total: 3 ml Patient sedated: no Preparation: Patient was prepped and draped in the usual sterile fashion. Irrigation solution: saline Irrigation method: jet lavage Amount of cleaning: extensive Debridement: none Degree of undermining: none Skin closure: 4-0 Prolene Number of sutures: 2 Technique: simple Approximation: close Approximation  difficulty: simple Dressing: antibiotic ointment Patient tolerance: Patient tolerated the procedure well with no immediate complications. Comments: Toe buddy taped to great toe with traction applied to hold in anatomical position    (including critical care time)  Labs Reviewed - No data to display Dg Toe 2nd Left  04/22/2013   *RADIOLOGY REPORT*  Clinical Data: Toe injury.  LEFT SECOND TOE  Comparison: None.  Findings: First MTP joint osteoarthritis.  Great toe appears intact.  There is a transverse fracture of the distal shaft of the proximal phalanx of the second toe which is mildly displaced and angulated.  Additionally, there are suboptimally seen fractures of the bases of the proximal phalanges of the third and fourth toes. Visualized portions of the fifth toe appear intact.  Middle phalanges and terminal phalanges appear intact throughout.  IMPRESSION: Fractures of the proximal phalanges of the second, third, and fourth toes.  No significant displacement and angulation is in the transverse distal fracture of the proximal phalanx of the left second toe.   Original Report Authenticated By: Andreas Newport, M.D.     1. Fracture of second toe, left, open, initial encounter   2. Fracture of left toe, closed, initial encounter   3. Laceration       MDM  I spoke with Dr. Jillyn Hidden who agrees with plan of IV antibiotics, irrigation, suture laceration, splint pain control crutches and follow up in office in morning         Arman Filter, NP 04/22/13 2344

## 2013-04-22 NOTE — ED Notes (Signed)
Pt in with open toe injury to right foot, hit on the leg of a couch approx 1 hour ago, sent from orthopedic MD office for further evaluation of open fx

## 2013-04-23 NOTE — ED Provider Notes (Signed)
Medical screening examination/treatment/procedure(s) were conducted as a shared visit with non-physician practitioner(s) and myself.  I personally evaluated the patient during the encounter.  Status post traumatic event to left foot.   X-ray show fractures of the digit 2,3, and 4 all on the proximal phalanx. Open fractures.  Intravenous antibiotics. Discussed with orthopedics. Will be reevaluated in the morning  Donnetta Hutching, MD 04/23/13 1539

## 2013-05-01 ENCOUNTER — Ambulatory Visit (HOSPITAL_BASED_OUTPATIENT_CLINIC_OR_DEPARTMENT_OTHER): Admission: RE | Admit: 2013-05-01 | Payer: 59 | Source: Ambulatory Visit | Admitting: Orthopedic Surgery

## 2013-05-01 ENCOUNTER — Encounter (HOSPITAL_BASED_OUTPATIENT_CLINIC_OR_DEPARTMENT_OTHER): Admission: RE | Payer: Self-pay | Source: Ambulatory Visit

## 2013-05-01 SURGERY — OPEN REDUCTION INTERNAL FIXATION (ORIF) PROXIMAL PHALANX
Anesthesia: General | Site: Toe | Laterality: Left

## 2013-05-20 ENCOUNTER — Encounter (HOSPITAL_COMMUNITY): Payer: Self-pay | Admitting: Pharmacy Technician

## 2013-05-21 ENCOUNTER — Encounter (HOSPITAL_COMMUNITY): Payer: Self-pay

## 2013-05-21 ENCOUNTER — Other Ambulatory Visit: Payer: Self-pay | Admitting: Orthopedic Surgery

## 2013-05-21 ENCOUNTER — Encounter (HOSPITAL_COMMUNITY)
Admission: RE | Admit: 2013-05-21 | Discharge: 2013-05-21 | Disposition: A | Discharge status: Home / Self Care | Payer: 59 | Source: Ambulatory Visit | Attending: Orthopedic Surgery | Admitting: Orthopedic Surgery

## 2013-05-21 ENCOUNTER — Encounter (HOSPITAL_COMMUNITY)
Admission: RE | Admit: 2013-05-21 | Discharge: 2013-05-21 | Disposition: A | Discharge status: Home / Self Care | Payer: 59 | Source: Ambulatory Visit | Attending: Anesthesiology | Admitting: Anesthesiology

## 2013-05-21 LAB — CBC
MCV: 84.2 fL (ref 78.0–100.0)
Platelets: 227 10*3/uL (ref 150–400)
RBC: 4.74 MIL/uL (ref 4.22–5.81)
RDW: 13.3 % (ref 11.5–15.5)
WBC: 12 10*3/uL — ABNORMAL HIGH (ref 4.0–10.5)

## 2013-05-21 LAB — BASIC METABOLIC PANEL
Calcium: 9.3 mg/dL (ref 8.4–10.5)
Creatinine, Ser: 1.51 mg/dL — ABNORMAL HIGH (ref 0.50–1.35)
GFR calc Af Amer: 59 mL/min — ABNORMAL LOW (ref >90)
GFR calc non Af Amer: 51 mL/min — ABNORMAL LOW (ref >90)
Sodium: 130 mEq/L — ABNORMAL LOW (ref 135–145)

## 2013-05-21 IMAGING — CR DG CHEST 2V
2 series · 2 of 2 positions shown · non-contrast
Comparison: [DATE]

CLINICAL DATA: Preop for toe surgery

CHEST - 2 VIEW

[w chest pa]
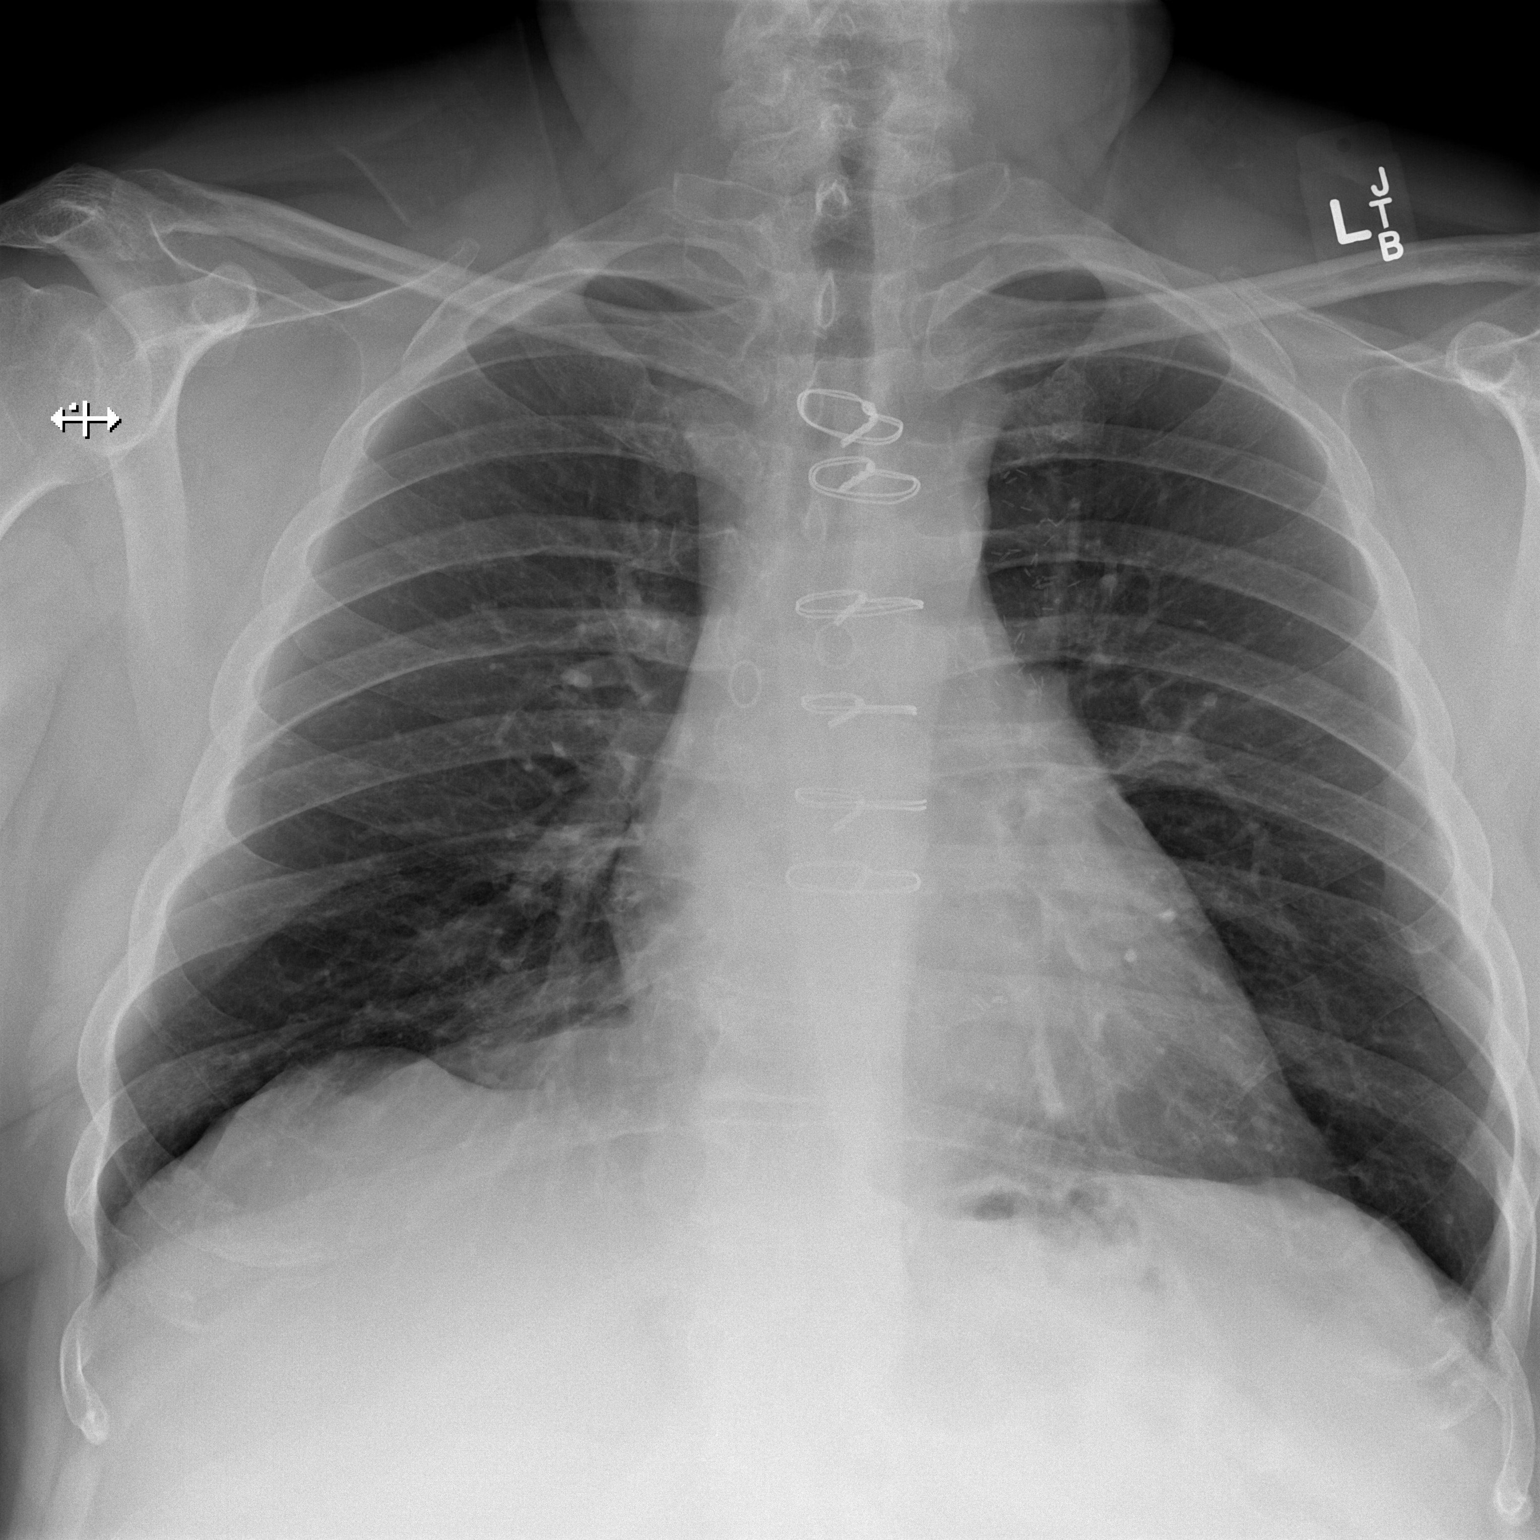

[w chest lat]
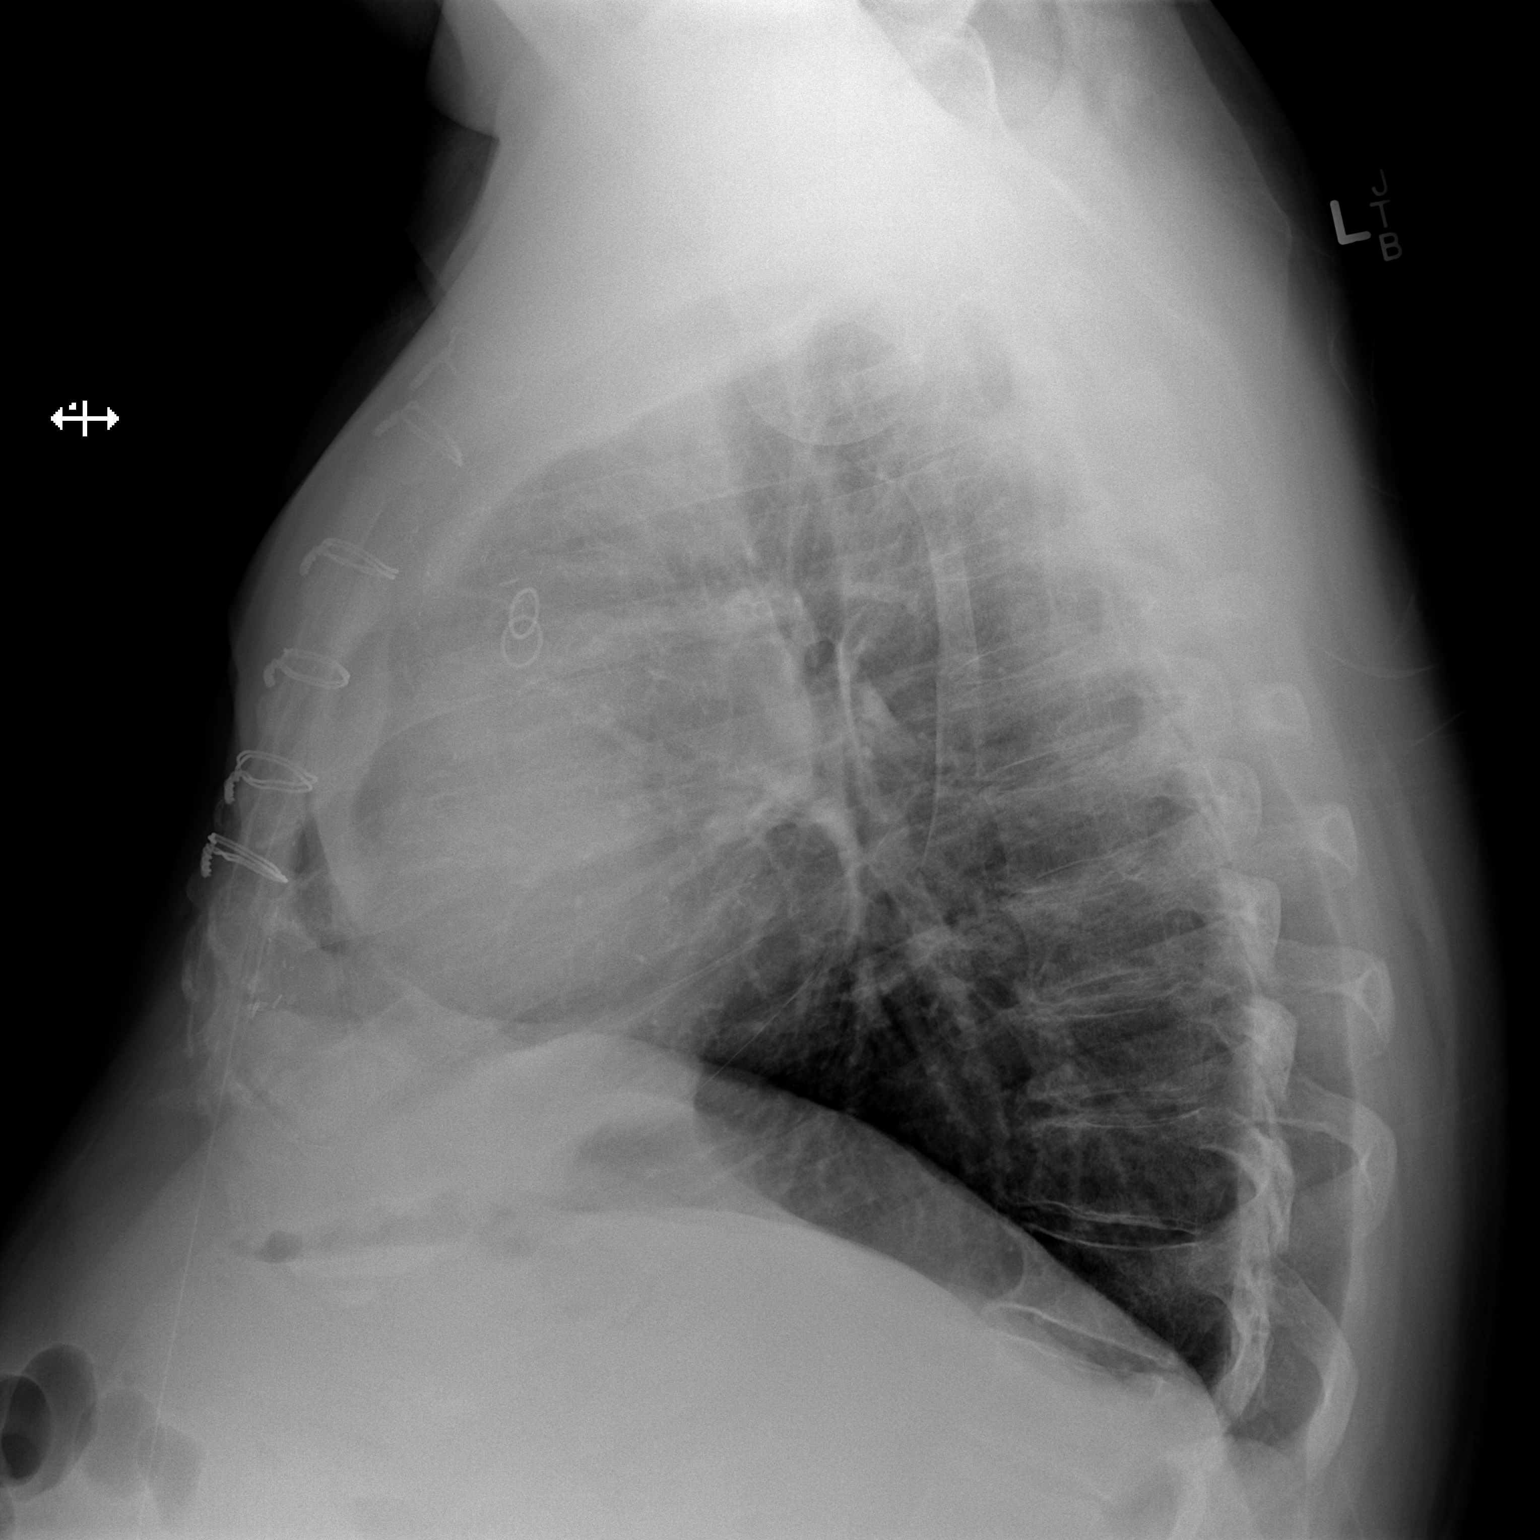

[2 of 2 positions shown; findings below may reference images not displayed]

FINDINGS: Cardiomediastinal silhouette is stable.  Status post CABG again
noted.  No acute infiltrate or pleural effusion.  No pulmonary
edema.  Mild hyperinflation.  Stable degenerative changes thoracic
spine.
IMPRESSION: No active disease.  Mild hyperinflation.  Mild degenerative changes
thoracic spine.

## 2013-05-21 NOTE — Progress Notes (Signed)
No orders, left message with Sherri at office.

## 2013-05-21 NOTE — Pre-Procedure Instructions (Signed)
DHRUV CHRISTINA  05/21/2013   Your procedure is scheduled on:  May 22, 2013  Report to [facility] at 1 PM (3rd floor short stay east elevators)  Call this number if you have problems the morning of surgery: [phone]   Remember:   Do not eat food or drink liquids after midnight.   Take these medicines the morning of surgery with A SIP OF WATER: ALPRAZolam (XANAX) 0.5 MG tablet, buPROPion (WELLBUTRIN XL) 300 MG 24 hr tablet, cephALEXin  (KEFLEX) 500 MG capsule, metoprolol (LOPRESSOR) 50 MG tablet, omeprazole (PRILOSEC) 20 MG capsule    Do not wear jewelry, make-up or nail polish.  Do not wear lotions, powders, or perfumes. You may wear deodorant.  Do not shave 48 hours prior to surgery. Men may shave face and neck.  Do not bring valuables to the hospital.  [facility] is not responsible for any belongings or valuables.  Contacts, dentures or bridgework may not be worn into surgery.  Leave suitcase in the car. After surgery it may be brought to your room.  For patients admitted to the hospital, checkout time is 11:00 AM the day of discharge.   Patients discharged the day of surgery will not be allowed to drive home.  Name and phone number of your driver: family/friend  Special Instructions: Shower using CHG 2 nights before surgery and the night before surgery.  If you shower the day of surgery use CHG.  Use special wash - you have one bottle of CHG for all showers.  You should use approximately 1/3 of the bottle for each shower.   Please read over the following fact sheets that you were given: Pain Booklet, Coughing and Deep Breathing and Surgical Site Infection Prevention

## 2013-05-22 ENCOUNTER — Ambulatory Visit (HOSPITAL_COMMUNITY)
Admission: RE | Admit: 2013-05-22 | Discharge: 2013-05-22 | Disposition: A | Discharge status: Home / Self Care | Payer: 59 | Source: Ambulatory Visit | Attending: Orthopedic Surgery | Admitting: Orthopedic Surgery

## 2013-05-22 ENCOUNTER — Ambulatory Visit (HOSPITAL_COMMUNITY): Payer: 59 | Admitting: Anesthesiology

## 2013-05-22 ENCOUNTER — Encounter (HOSPITAL_COMMUNITY): Payer: Self-pay | Admitting: Anesthesiology

## 2013-05-22 ENCOUNTER — Encounter (HOSPITAL_COMMUNITY): Payer: Self-pay | Admitting: *Deleted

## 2013-05-22 ENCOUNTER — Encounter (HOSPITAL_COMMUNITY)
Admission: RE | Disposition: A | Discharge status: Home / Self Care | Payer: Self-pay | Source: Ambulatory Visit | Attending: Orthopedic Surgery

## 2013-05-22 DIAGNOSIS — I2510 Atherosclerotic heart disease of native coronary artery without angina pectoris: Secondary | ICD-10-CM | POA: Insufficient documentation

## 2013-05-22 DIAGNOSIS — Z951 Presence of aortocoronary bypass graft: Secondary | ICD-10-CM | POA: Insufficient documentation

## 2013-05-22 DIAGNOSIS — E119 Type 2 diabetes mellitus without complications: Secondary | ICD-10-CM | POA: Insufficient documentation

## 2013-05-22 DIAGNOSIS — Z79899 Other long term (current) drug therapy: Secondary | ICD-10-CM | POA: Insufficient documentation

## 2013-05-22 DIAGNOSIS — X58XXXA Exposure to other specified factors, initial encounter: Secondary | ICD-10-CM | POA: Insufficient documentation

## 2013-05-22 DIAGNOSIS — S92912D Unspecified fracture of left toe(s), subsequent encounter for fracture with routine healing: Secondary | ICD-10-CM

## 2013-05-22 DIAGNOSIS — S92919B Unspecified fracture of unspecified toe(s), initial encounter for open fracture: Secondary | ICD-10-CM | POA: Insufficient documentation

## 2013-05-22 DIAGNOSIS — G4730 Sleep apnea, unspecified: Secondary | ICD-10-CM | POA: Insufficient documentation

## 2013-05-22 DIAGNOSIS — Z7982 Long term (current) use of aspirin: Secondary | ICD-10-CM | POA: Insufficient documentation

## 2013-05-22 DIAGNOSIS — I10 Essential (primary) hypertension: Secondary | ICD-10-CM | POA: Insufficient documentation

## 2013-05-22 DIAGNOSIS — L97509 Non-pressure chronic ulcer of other part of unspecified foot with unspecified severity: Secondary | ICD-10-CM | POA: Insufficient documentation

## 2013-05-22 HISTORY — PX: ORIF TOE FRACTURE: SHX5032

## 2013-05-22 LAB — GLUCOSE, CAPILLARY
Glucose-Capillary: 114 mg/dL — ABNORMAL HIGH (ref 70–99)
Glucose-Capillary: 91 mg/dL (ref 70–99)

## 2013-05-22 SURGERY — OPEN REDUCTION INTERNAL FIXATION (ORIF) METATARSAL (TOE) FRACTURE
Anesthesia: General | Site: Toe | Laterality: Left | Wound class: Dirty or Infected

## 2013-05-22 MED ORDER — BACITRACIN ZINC 500 UNIT/GM EX OINT
TOPICAL_OINTMENT | CUTANEOUS | Status: DC | PRN
  Administered 2013-05-22: 1 via TOPICAL

## 2013-05-22 MED ORDER — OXYCODONE HCL 5 MG/5ML PO SOLN: 5.0000 mg | Freq: Once | ORAL | Status: DC | PRN

## 2013-05-22 MED ORDER — ONDANSETRON HCL 4 MG/2ML IJ SOLN
INTRAMUSCULAR | Status: DC | PRN
  Administered 2013-05-22: 4 mg via INTRAVENOUS

## 2013-05-22 MED ORDER — FENTANYL CITRATE 0.05 MG/ML IJ SOLN
INTRAMUSCULAR | Status: DC | PRN
  Administered 2013-05-22: 50 ug via INTRAVENOUS

## 2013-05-22 MED ORDER — 0.9 % SODIUM CHLORIDE (POUR BTL) OPTIME
TOPICAL | Status: DC | PRN
  Administered 2013-05-22: 1000 mL

## 2013-05-22 MED ORDER — LACTATED RINGERS IV SOLN
INTRAVENOUS | Status: DC | PRN
  Administered 2013-05-22 (×2): via INTRAVENOUS

## 2013-05-22 MED ORDER — HYDROCODONE-ACETAMINOPHEN 5-325 MG PO TABS: 1.0000 | ORAL_TABLET | Freq: Four times a day (QID) | ORAL | Status: DC | PRN

## 2013-05-22 MED ORDER — HYDROMORPHONE HCL PF 1 MG/ML IJ SOLN: 0.2500 mg | INTRAMUSCULAR | Status: DC | PRN

## 2013-05-22 MED ORDER — CEFAZOLIN SODIUM-DEXTROSE 2-3 GM-% IV SOLR
2.0000 g | INTRAVENOUS | Status: AC
  Administered 2013-05-22: 2 g via INTRAVENOUS
  Filled 2013-05-22: qty 50

## 2013-05-22 MED ORDER — BUPIVACAINE-EPINEPHRINE PF 0.5-1:200000 % IJ SOLN
INTRAMUSCULAR | Status: DC | PRN
  Administered 2013-05-22: 30 mL

## 2013-05-22 MED ORDER — MIDAZOLAM HCL 2 MG/2ML IJ SOLN: 0.5000 mg | INTRAMUSCULAR | Status: DC | PRN

## 2013-05-22 MED ORDER — SODIUM CHLORIDE 0.9 % IV SOLN: INTRAVENOUS | Status: DC

## 2013-05-22 MED ORDER — EPHEDRINE SULFATE 50 MG/ML IJ SOLN
INTRAMUSCULAR | Status: DC | PRN
  Administered 2013-05-22: 5 mg via INTRAVENOUS

## 2013-05-22 MED ORDER — OXYCODONE HCL 5 MG PO TABS: 5.0000 mg | ORAL_TABLET | Freq: Once | ORAL | Status: DC | PRN

## 2013-05-22 MED ORDER — PHENYLEPHRINE HCL 10 MG/ML IJ SOLN
INTRAMUSCULAR | Status: DC | PRN
  Administered 2013-05-22 (×3): 40 ug via INTRAVENOUS

## 2013-05-22 MED ORDER — MIDAZOLAM HCL 5 MG/5ML IJ SOLN
INTRAMUSCULAR | Status: DC | PRN
  Administered 2013-05-22 (×2): 1 mg via INTRAVENOUS

## 2013-05-22 MED ORDER — CHLORHEXIDINE GLUCONATE 4 % EX LIQD: 60.0000 mL | Freq: Once | CUTANEOUS | Status: DC

## 2013-05-22 MED ORDER — BACITRACIN ZINC 500 UNIT/GM EX OINT
TOPICAL_OINTMENT | CUTANEOUS | Status: AC
  Filled 2013-05-22: qty 15

## 2013-05-22 MED ORDER — FENTANYL CITRATE 0.05 MG/ML IJ SOLN: 50.0000 ug | INTRAMUSCULAR | Status: DC | PRN

## 2013-05-22 MED ORDER — BUPIVACAINE HCL (PF) 0.5 % IJ SOLN
INTRAMUSCULAR | Status: DC | PRN
  Administered 2013-05-22: 10 mL

## 2013-05-22 SURGICAL SUPPLY — 48 items
0.62 k wire, from k-wire set (Wire) ×1 IMPLANT
BANDAGE CONFORM 2  STR LF (GAUZE/BANDAGES/DRESSINGS) ×1 IMPLANT
BANDAGE ESMARK 6X9 LF (GAUZE/BANDAGES/DRESSINGS) ×1 IMPLANT
BLADE SURG 10 STRL SS (BLADE) ×2 IMPLANT
BNDG CMPR 9X6 STRL LF SNTH (GAUZE/BANDAGES/DRESSINGS) ×1
BNDG COHESIVE 4X5 TAN STRL (GAUZE/BANDAGES/DRESSINGS) ×2 IMPLANT
BNDG COHESIVE 6X5 TAN STRL LF (GAUZE/BANDAGES/DRESSINGS) ×2 IMPLANT
BNDG ESMARK 6X9 LF (GAUZE/BANDAGES/DRESSINGS) ×2
CAP PIN ORTHO PINK (CAP) ×1 IMPLANT
CHLORAPREP W/TINT 26ML (MISCELLANEOUS) ×2 IMPLANT
CLOTH BEACON ORANGE TIMEOUT ST (SAFETY) ×2 IMPLANT
COVER SURGICAL LIGHT HANDLE (MISCELLANEOUS) ×2 IMPLANT
CUFF TOURNIQUET SINGLE 34IN LL (TOURNIQUET CUFF) ×2 IMPLANT
CUFF TOURNIQUET SINGLE 44IN (TOURNIQUET CUFF) IMPLANT
DRAPE C-ARM 42X72 X-RAY (DRAPES) ×2 IMPLANT
DRAPE U-SHAPE 47X51 STRL (DRAPES) ×2 IMPLANT
DRSG ADAPTIC 3X8 NADH LF (GAUZE/BANDAGES/DRESSINGS) ×1 IMPLANT
DRSG PAD ABDOMINAL 8X10 ST (GAUZE/BANDAGES/DRESSINGS) IMPLANT
ELECT REM PT RETURN 9FT ADLT (ELECTROSURGICAL) ×2
ELECTRODE REM PT RTRN 9FT ADLT (ELECTROSURGICAL) ×1 IMPLANT
GLOVE BIO SURGEON STRL SZ8 (GLOVE) ×2 IMPLANT
GLOVE BIOGEL PI IND STRL 8 (GLOVE) ×1 IMPLANT
GLOVE BIOGEL PI INDICATOR 8 (GLOVE) ×1
GOWN PREVENTION PLUS XLARGE (GOWN DISPOSABLE) ×2 IMPLANT
GOWN STRL NON-REIN LRG LVL3 (GOWN DISPOSABLE) ×4 IMPLANT
KIT BASIN OR (CUSTOM PROCEDURE TRAY) ×2 IMPLANT
KIT ROOM TURNOVER OR (KITS) ×2 IMPLANT
MANIFOLD NEPTUNE II (INSTRUMENTS) ×2 IMPLANT
NEEDLE 22X1 1/2 (OR ONLY) (NEEDLE) IMPLANT
NS IRRIG 1000ML POUR BTL (IV SOLUTION) ×2 IMPLANT
PACK ORTHO EXTREMITY (CUSTOM PROCEDURE TRAY) ×2 IMPLANT
PAD ARMBOARD 7.5X6 YLW CONV (MISCELLANEOUS) ×4 IMPLANT
PAD CAST 4YDX4 CTTN HI CHSV (CAST SUPPLIES) ×1 IMPLANT
PADDING CAST COTTON 4X4 STRL (CAST SUPPLIES) ×2
SOAP 2 % CHG 4 OZ (WOUND CARE) ×2 IMPLANT
SPONGE GAUZE 4X4 12PLY (GAUZE/BANDAGES/DRESSINGS) ×1 IMPLANT
STAPLER VISISTAT 35W (STAPLE) IMPLANT
SUCTION FRAZIER TIP 10 FR DISP (SUCTIONS) ×2 IMPLANT
SUT PROLENE 3 0 PS 2 (SUTURE) ×2 IMPLANT
SUT VIC AB 2-0 CT1 27 (SUTURE) ×2
SUT VIC AB 2-0 CT1 TAPERPNT 27 (SUTURE) ×2 IMPLANT
SUT VIC AB 3-0 PS2 18 (SUTURE) ×2
SUT VIC AB 3-0 PS2 18XBRD (SUTURE) ×1 IMPLANT
SYR CONTROL 10ML LL (SYRINGE) IMPLANT
TOWEL OR 17X24 6PK STRL BLUE (TOWEL DISPOSABLE) ×2 IMPLANT
TOWEL OR 17X26 10 PK STRL BLUE (TOWEL DISPOSABLE) ×2 IMPLANT
TUBE CONNECTING 12X1/4 (SUCTIONS) ×2 IMPLANT
WATER STERILE IRR 1000ML POUR (IV SOLUTION) ×2 IMPLANT

## 2013-05-22 NOTE — Progress Notes (Signed)
Orthopedic Tech Progress Note Patient Details:  [name] [name] 1956-01-21 [id number]  Ortho Devices Type of Ortho Device: Postop shoe/boot Ortho Device/Splint Interventions: Application   Shawnie Pons 05/22/2013, 5:26 PM

## 2013-05-22 NOTE — Brief Op Note (Addendum)
05/22/2013  4:36 PM  PATIENT:  [name]  57 y.o. male  PRE-OPERATIVE DIAGNOSIS:  LEFT SECOND TOE PROXIMAL PHALANX FRACTURE  POST-OPERATIVE DIAGNOSIS:  Comminuted intra-articular fracture of the left 2nd toe proximal phalanx  Procedure(s): 1.  Left 2nd toe PIP joint arthrodesis 2.  AP and lateral radiographs of left foot  SURGEON:  Toni Arthurs, MD  ASSISTANT: n/a  ANESTHESIA:   General  EBL:  minimal   TOURNIQUET:  approx 20 min with an ankle esmarch  COMPLICATIONS:  None apparent  DISPOSITION:  Extubated, awake and stable to recovery.  DICTATION ID:  [id number]  Radiographs:  Ap and lateral intra-operative radiographs were obtained af the left foot.  These show interval arthrodesis of the left 2nd toe PIP joint with a pin traversing the IP and MP joints.  Fractures of the 3rd and 4th proximal phalanges are appropriately reduced.

## 2013-05-22 NOTE — Transfer of Care (Signed)
Immediate Anesthesia Transfer of Care Note  Patient: [name] [name]  Procedure(s) Performed: Procedure(s): OPEN REDUCTION INTERNAL FIXATION (ORIF) LEFT SECOND TOE PROXIMAL PHALANX FRACTURE/LEFT PERCUTANEOUS PINNING (Left)  Patient Location: PACU  Anesthesia Type:General  Level of Consciousness: awake, alert  and oriented  Airway & Oxygen Therapy: Patient connected to face mask  Post-op Assessment: Report given to PACU RN  Post vital signs: stable  Complications: No apparent anesthesia complications

## 2013-05-22 NOTE — Anesthesia Procedure Notes (Addendum)
Anesthesia Regional Block:  Popliteal block  Pre-Anesthetic Checklist: ,, timeout performed, Correct Patient, Correct Site, Correct Laterality, Correct Procedure, Correct Position, site marked, Risks and benefits discussed, pre-op evaluation, post-op pain management  Laterality: Left  Prep: Maximum Sterile Barrier Precautions used and chloraprep       Needles:  Injection technique: Single-shot  Needle Type: Echogenic Stimulator Needle          Additional Needles:  Procedures: ultrasound guided (picture in chart) and nerve stimulator Popliteal block  Nerve Stimulator or Paresthesia:  Response: Peroneal, 0.4 mA,  Response: Tibial, 0.4 mA,   Additional Responses:   Narrative:  Start time: 05/22/2013 2:38 PM End time: 05/22/2013 2:50 PM Injection made incrementally with aspirations every 5 mL. Anesthesiologist: Sampson Goon, MD  Additional Notes: 2% Lidocaine skin wheel. Saphenous block with 10cc of 0.5% Bupivicaine plain.  Popliteal block Procedure Name: LMA Insertion Date/Time: 05/22/2013 3:42 PM Performed by: Ellin Goodie Pre-anesthesia Checklist: Patient identified, Emergency Drugs available, Suction available, Patient being monitored and Timeout performed Patient Re-evaluated:Patient Re-evaluated prior to inductionOxygen Delivery Method: Circle system utilized Preoxygenation: Pre-oxygenation with 100% oxygen Intubation Type: IV induction Ventilation: Mask ventilation without difficulty LMA: LMA inserted LMA Size: 5.0 Number of attempts: 1 Placement Confirmation: breath sounds checked- equal and bilateral and positive ETCO2 Tube secured with: Tape Dental Injury: Teeth and Oropharynx as per pre-operative assessment

## 2013-05-22 NOTE — Anesthesia Postprocedure Evaluation (Signed)
  Anesthesia Post-op Note  Patient: [name] [name]  Procedure(s) Performed: Procedure(s): OPEN REDUCTION INTERNAL FIXATION (ORIF) LEFT SECOND TOE PROXIMAL PHALANX FRACTURE/LEFT PERCUTANEOUS PINNING (Left)  Patient Location: PACU  Anesthesia Type:GA combined with regional for post-op pain  Level of Consciousness: awake, alert  and oriented  Airway and Oxygen Therapy: Patient Spontanous Breathing  Post-op Pain: none  Post-op Assessment: Post-op Vital signs reviewed, Patient's Cardiovascular Status Stable, Respiratory Function Stable, Patent Airway and No signs of Nausea or vomiting  Post-op Vital Signs: Reviewed and stable  Complications: No apparent anesthesia complications

## 2013-05-22 NOTE — H&P (Signed)
[name] [name] is an 57 y.o. male.   Chief Complaint: left 2nd toe fracture HPI: 57 y/o male with h/o left 2nd toe open fractuer a few weeks ago.  He underwent  I and D and presents now for ORIF.    Past Medical History  Diagnosis Date  . CAD (coronary artery disease)   . Hypertension   . Diabetes mellitus without complication   . Sleep apnea     dx but does not use CPAP    Past Surgical History  Procedure Laterality Date  . Coronary artery bypass graft    . Tonsillectomy    . Cardiac catheterization      History reviewed. No pertinent family history. Social History:  reports that he has never smoked. He does not have any smokeless tobacco history on file. He reports that he does not drink alcohol or use illicit drugs.  Allergies: No Known Allergies  Medications Prior to Admission  Medication Sig Dispense Refill  . ALPRAZolam (XANAX) 0.5 MG tablet Take 0.5 mg by mouth 2 (two) times daily as needed for anxiety.      Marland Kitchen aspirin 325 MG tablet Take 325 mg by mouth daily.      Marland Kitchen atorvastatin (LIPITOR) 80 MG tablet Take 80 mg by mouth daily.      Marland Kitchen buPROPion (WELLBUTRIN XL) 300 MG 24 hr tablet Take 300 mg by mouth daily.      . cephALEXin (KEFLEX) 500 MG capsule Take 1 capsule (500 mg total) by mouth 4 (four) times daily.  20 capsule  0  . fish oil-omega-3 fatty acids 1000 MG capsule Take 1 g by mouth 2 (two) times daily.      . Liraglutide (VICTOZA) 18 MG/3ML SOPN Inject 1.8 mg into the skin daily.      . metoprolol (LOPRESSOR) 50 MG tablet Take 50 mg by mouth 2 (two) times daily.      Marland Kitchen olmesartan-hydrochlorothiazide (BENICAR HCT) 40-25 MG per tablet Take 1 tablet by mouth daily.      Marland Kitchen omeprazole (PRILOSEC) 20 MG capsule Take 20 mg by mouth every evening.      . sildenafil (VIAGRA) 100 MG tablet Take 100 mg by mouth daily as needed for erectile dysfunction.        Results for orders placed during the hospital encounter of 05/22/13 (from the past 48 hour(s))  GLUCOSE, CAPILLARY      Status: Abnormal   Collection Time    05/22/13 12:32 PM      Result Value Range   Glucose-Capillary 114 (*) 70 - 99 mg/dL  GLUCOSE, CAPILLARY     Status: None   Collection Time    05/22/13  2:34 PM      Result Value Range   Glucose-Capillary 88  70 - 99 mg/dL   Dg Chest 2 View  07/27/2951   *RADIOLOGY REPORT*  Clinical Data: Preop for toe surgery  CHEST - 2 VIEW  Comparison: 08/02/2011  Findings: Cardiomediastinal silhouette is stable.  Status post CABG again noted.  No acute infiltrate or pleural effusion.  No pulmonary edema.  Mild hyperinflation.  Stable degenerative changes thoracic spine.  IMPRESSION: No active disease.  Mild hyperinflation.  Mild degenerative changes thoracic spine.   Original Report Authenticated By: Natasha Mead, M.D.    ROS  No recent f/c/n/v/wt loss  Blood pressure 110/73, pulse 72, temperature 98.2 F (36.8 C), temperature source Oral, resp. rate 20, SpO2 97.00%. Physical Exam  wn wd male in nad.  A  and O x 4.  Mood and affect normal.  EOMI.  Resp unlabored.  L 2nd toe with heatlhy and intact skin.  Grossly deformed with angulation in PF.  Brisk cap refill at the toe.  Sens to LT intact.  No lymphadenopathy.  5/5 strength in PF and DF.  Assessment/Plan Left 2nd toe fracture - to OR for ORIF.  The risks and benefits of the alternative treatment options have been discussed in detail.  The patient wishes to proceed with surgery and specifically understands risks of bleeding, infection, nerve damage, blood clots, need for additional surgery, amputation and death.   Toni Arthurs 28-May-2013, 3:30 PM

## 2013-05-22 NOTE — Anesthesia Preprocedure Evaluation (Addendum)
Anesthesia Evaluation  Patient identified by MRN, date of birth, ID band Patient awake    Reviewed: Allergy & Precautions, H&P , NPO status , Patient's Chart, lab work & pertinent test results, reviewed documented beta blocker date and time   Airway Mallampati: II TM Distance: >3 FB Neck ROM: Full  Mouth opening: Limited Mouth Opening  Dental  (+) Teeth Intact and Dental Advisory Given   Pulmonary sleep apnea ,  breath sounds clear to auscultation        Cardiovascular hypertension, Pt. on medications + CAD and + CABG Rhythm:Regular     Neuro/Psych    GI/Hepatic   Endo/Other  diabetes, Well Controlled, Type 2, Oral Hypoglycemic Agents  Renal/GU      Musculoskeletal   Abdominal (+)  Abdomen: soft. Bowel sounds: normal.  Peds  Hematology   Anesthesia Other Findings Cabg Apr 2011  Reproductive/Obstetrics                         Anesthesia Physical Anesthesia Plan  ASA: III  Anesthesia Plan: General   Post-op Pain Management:    Induction: Intravenous  Airway Management Planned: LMA  Additional Equipment:   Intra-op Plan:   Post-operative Plan: Extubation in OR  Informed Consent: I have reviewed the patients History and Physical, chart, labs and discussed the procedure including the risks, benefits and alternatives for the proposed anesthesia with the patient or authorized representative who has indicated his/her understanding and acceptance.     Plan Discussed with: CRNA, Anesthesiologist and Surgeon  Anesthesia Plan Comments:       Anesthesia Quick Evaluation

## 2013-05-23 NOTE — Op Note (Signed)
NAME:  [name] [name], [name] [name] NO.:  [id number]  MEDICAL RECORD NO.:  [id number]  LOCATION:  [facility]                         FACILITY:  [facility]  PHYSICIAN:  Toni Arthurs, MD        DATE OF BIRTH:  1956/01/18  DATE OF PROCEDURE:  05/22/2013 DATE OF DISCHARGE:  05/22/2013                              OPERATIVE REPORT   PREOPERATIVE DIAGNOSIS:  Left second toe proximal phalanx fracture.  POSTOPERATIVE DIAGNOSIS:  Comminuted intra-articular fracture of the left second toe proximal phalanx.  PROCEDURE: 1. Left second toe PIP joint arthrodesis. 2. AP and lateral radiographs of the left foot.  SURGEON:  Toni Arthurs, MD  ANESTHESIA:  General.  ESTIMATED BLOOD LOSS:  Minimal.  TOURNIQUET TIME:  Approximately 20 minutes with an ankle Esmarch.  COMPLICATIONS:  None apparent.  DISPOSITION:  Extubated, awake and stable to recovery.  INDICATIONS FOR PROCEDURE:  The patient is a 57 year old male who suffered fractures to the second, third, and fourth toes several weeks ago.  He underwent I and D of what appeared to be an open fracture of the left second toe the day of his injury.  He followed up with me for further evaluation and treatment.  At that initial visit, I recommended open reduction and internal fixation as soon as possible.  The patient declined this option so that he could go on a trip.  He presents now for operative treatment having returned from his trip.  He is taking Keflex currently.  He denies any recent fever, chills, nausea, vomiting, or changes in his appetite.  He understands the risks, benefits, the alternative of treatment options and elects surgical treatment.  He specifically understands risks of bleeding, infection, nerve damage, blood clots, need for additional surgery, amputation, and death.  I specifically explained to the patient I was concerned for the chance of amputation of the second toe given his history of open fracture coupled with his  history of diabetes.  He understands these risks and elects to proceed.  PROCEDURE IN DETAIL:  After preoperative consent was obtained, the correct operative site was identified, the patient was brought to the operating room and placed supine on the operating table.  General anesthesia was induced.  Preoperative antibiotics were administered. Surgical time-out was taken.  The left lower extremity was prepped and draped in standard sterile fashion.  The foot was exsanguinated and a 4 inch Esmarch tourniquet was wrapped around the ankle.  The patient was noted to have an ulcer on the lateral aspect of the second toe where the angulated fracture molded the toe against the third toe.  He was also noted to have a small ulcer on the distal medial aspect of the toe.  The lateral ulcer was fairly large at approximately 1 cm x 1.5 cm x 2 mm deep.  Medially, the ulcer was superficial.  A longitudinal incision was made over the PIP joint.  Sharp dissection was carried down through the skin, subcutaneous tissue and the extensor tendon.  There was no necrotic appearing material or purulence noted.  The distal articular surface of the proximal phalanx was noted to be quite comminuted.  There were multiple small fragments  including fragments of nonviable subchondral bone and cartilage.  Given the severely comminuted and shortened fracture as well as the length of time since the injury, the toe could not be reduced with the intervening fragments in place.  The decision was made to proceed with PIP joint arthrodesis as a salvage procedure.  All of the comminuted fragments were excised.  The base of the middle phalanx was also excised with a rongeur.  The fracture was irrigated copiously with a L of normal saline.  A 0.0625 K-wire was inserted out through the tip of the toe and then retrograde across the IP joints and the MP joint.  AP and lateral fluoroscopic images confirmed appropriate position of this  pin and appropriate reduction of the PIP joint.  The pin was then bent, trimmed, and capped.  The dorsal incision was again irrigated.  Vertical mattress sutures of 3-0 nylon were used to close the dorsal incision.  The 2 ulcers were dressed with Adaptic and as was the incision. Sterile dressings were applied.  The tourniquet was released.  Hemostasis was achieved.  The compression wrap was applied.  The patient was awakened by Anesthesia and transported to recovery room in stable condition.  FOLLOWUP PLAN:  The patient will be weightbearing as tolerated on his foot and a hard-sole shoe.  He will follow up with me in 2 weeks for his suture removal and wound check.  He will finish his current course of Keflex and then stop his antibiotics.     Toni Arthurs, MD     JH/MEDQ  D:  05/22/2013  T:  05/23/2013  Job:  [id number]

## 2013-05-26 ENCOUNTER — Encounter (HOSPITAL_COMMUNITY): Payer: Self-pay | Admitting: Orthopedic Surgery

## 2014-05-13 ENCOUNTER — Ambulatory Visit: Payer: 59 | Admitting: Interventional Cardiology

## 2014-06-09 ENCOUNTER — Ambulatory Visit: Payer: 59 | Admitting: Interventional Cardiology

## 2014-07-03 ENCOUNTER — Ambulatory Visit (INDEPENDENT_AMBULATORY_CARE_PROVIDER_SITE_OTHER): Payer: 59 | Admitting: Podiatry

## 2014-07-03 ENCOUNTER — Encounter: Payer: Self-pay | Admitting: Podiatry

## 2014-07-03 ENCOUNTER — Ambulatory Visit (INDEPENDENT_AMBULATORY_CARE_PROVIDER_SITE_OTHER): Payer: 59

## 2014-07-03 VITALS — BP 142/90 | HR 74 | Resp 18

## 2014-07-03 DIAGNOSIS — R52 Pain, unspecified: Secondary | ICD-10-CM

## 2014-07-03 DIAGNOSIS — L97509 Non-pressure chronic ulcer of other part of unspecified foot with unspecified severity: Secondary | ICD-10-CM

## 2014-07-03 MED ORDER — SILVER SULFADIAZINE 1 % EX CREA: 1.0000 "application " | TOPICAL_CREAM | Freq: Every day | CUTANEOUS | Status: DC

## 2014-07-03 NOTE — Progress Notes (Signed)
   Subjective:    Patient ID: [name] [name], male    DOB: 1956/09/24, 58 y.o.   MRN: [id number]  HPI  Patient presents to the office with complaints of ulceration on the plantar aspect of his right foot. He states the wound has been present since the middle of June. He believes it started after he was trying to treat himself for plantar fasciitis. He was building up the heels which allowed the forefoot to have increased pressure in his shoe. He was doing a lot of walking while on vacation when the wound started. He was been peeling the dry skin off from around the wound himself and soaking the foot. He was seen by his PCP today who sent him over for evaluation.  He has been placed on Keflex. States his blood sugar has been slightly elevated recently. He denies any systemic complaints such as fevers, chills, nausea, vomiting. No other complaints at this time.    Review of Systems  Skin:       Wound right foot   All other systems reviewed and are negative.      Objective:   Physical Exam  Nursing note and vitals reviewed. Constitutional: He is oriented to person, place, and time. He appears well-developed and well-nourished.  Musculoskeletal: Normal range of motion. He exhibits no edema and no tenderness.  No tenderness to palpation. Hallux limitus R >L. Mild HAV. Chronic deformity to digits on the left foot from prior injury.   Neurological: He is alert and oriented to person, place, and time.  Protective sensation intact the Semmes Weinstein monofilament, decreased vibratory sensation.   Skin:  Thick hyperkeratotic lesion on the right foot submetatarsal one. Upon debridement there is a superficial granular wound measuring 1.5 x 1 cm. No probing or undermining. Periwound skin is slightly macerated under hyperkeratotic skin. No surrounding erythema, no drainage/purulence. No ascending cellulitis. Left submetatarsal 1 with hyperkerotic lesion. Upon debridement, small superficial 2x301mm wound,  granular; no probing/undermining. No clinical signs of infection.    DP/PT pulses palpable. CRT < 3 sec.         Assessment & Plan:  58 year old male right submetatarsal 1 ulceration, hyperkeratotic lesion left submetatarsal one -X-Rays obtained of the right foot. See xray report for full details; no OM - Hyperkerotic lesions sharply debrided without complication b/l submet 1; wound sharply debrided R without complication -Recommend strict offloading to help heel the wound. Dispensed surgical shoe with felt offloading pads added to help.  -Prescribed silvadene. Continue with daily dressing changes to right foot. Antibiotic ointment and a band-aid applied to the left.  -Finish course of antibiotics (Keflex).  -Discussed both short term goals to get this wound to heel and long term goals to help prevent this wound from recurring once healed (such as proper shoegear/possible surgery due to hallux limitus).  -Signs/symptoms of infection discussed with the patient in detail. He was directed to call the office immediatly or go directly to the emergency room if any occur.

## 2014-07-03 NOTE — Patient Instructions (Signed)
Monitor for any signs/symptoms of infection. Call the office immediatlyDiabetes and Foot Care Diabetes may cause you to have problems because of poor blood supply (circulation) to your feet and legs. This may cause the skin on your feet to become thinner, break easier, and heal more slowly. Your skin may become dry, and the skin may peel and crack. You may also have nerve damage in your legs and feet causing decreased feeling in them. You may not notice minor injuries to your feet that could lead to infections or more serious problems. Taking care of your feet is one of the most important things you can do for yourself.  HOME CARE INSTRUCTIONS  Wear shoes at all times, even in the house. Do not go barefoot. Bare feet are easily injured.  Check your feet daily for blisters, cuts, and redness. If you cannot see the bottom of your feet, use a mirror or ask someone for help.  Wash your feet with warm water (do not use hot water) and mild soap. Then pat your feet and the areas between your toes until they are completely dry. Do not soak your feet as this can dry your skin.  Apply a moisturizing lotion or petroleum jelly (that does not contain alcohol and is unscented) to the skin on your feet and to dry, brittle toenails. Do not apply lotion between your toes.  Trim your toenails straight across. Do not dig under them or around the cuticle. File the edges of your nails with an emery board or nail file.  Do not cut corns or calluses or try to remove them with medicine.  Wear clean socks or stockings every day. Make sure they are not too tight. Do not wear knee-high stockings since they may decrease blood flow to your legs.  Wear shoes that fit properly and have enough cushioning. To break in new shoes, wear them for just a few hours a day. This prevents you from injuring your feet. Always look in your shoes before you put them on to be sure there are no objects inside.  Do not cross your legs. This may  decrease the blood flow to your feet.  If you find a minor scrape, cut, or break in the skin on your feet, keep it and the skin around it clean and dry. These areas may be cleansed with mild soap and water. Do not cleanse the area with peroxide, alcohol, or iodine.  When you remove an adhesive bandage, be sure not to damage the skin around it.  If you have a wound, look at it several times a day to make sure it is healing.  Do not use heating pads or hot water bottles. They may burn your skin. If you have lost feeling in your feet or legs, you may not know it is happening until it is too late.  Make sure your health care provider performs a complete foot exam at least annually or more often if you have foot problems. Report any cuts, sores, or bruises to your health care provider immediately. SEEK MEDICAL CARE IF:   You have an injury that is not healing.  You have cuts or breaks in the skin.  You have an ingrown nail.  You notice redness on your legs or feet.  You feel burning or tingling in your legs or feet.  You have pain or cramps in your legs and feet.  Your legs or feet are numb.  Your feet always feel cold. SEEK IMMEDIATE  MEDICAL CARE IF:   There is increasing redness, swelling, or pain in or around a wound.  There is a red line that goes up your leg.  Pus is coming from a wound.  You develop a fever or as directed by your health care provider.  You notice a bad smell coming from an ulcer or wound. Document Released: 11/03/2000 Document Revised: 07/09/2013 Document Reviewed: 04/15/2013 [facility] Patient Information 2015 [employer], [place]. This information is not intended to replace advice given to you by your health care provider. Make sure you discuss any questions you have with your health care provider.  if any occur, or go directly to the emergency room.   Apply silvadene to the wound daily followed by a dry dressing.

## 2014-07-10 ENCOUNTER — Ambulatory Visit (INDEPENDENT_AMBULATORY_CARE_PROVIDER_SITE_OTHER): Payer: 59 | Admitting: Podiatry

## 2014-07-10 ENCOUNTER — Encounter: Payer: Self-pay | Admitting: Podiatry

## 2014-07-10 VITALS — BP 115/69 | HR 75 | Resp 16

## 2014-07-10 DIAGNOSIS — L97509 Non-pressure chronic ulcer of other part of unspecified foot with unspecified severity: Secondary | ICD-10-CM

## 2014-07-10 NOTE — Patient Instructions (Signed)
Continue with daily dressing changes. Monitor for any signs/symptoms of infection. Call the office immediately if any occur or go directly to the emergency room. Call with any questions/concerns.  

## 2014-07-10 NOTE — Progress Notes (Signed)
   Subjective:    Patient ID: [name] [name], male    DOB: 1956/02/20, 58 y.o.   MRN: [id number]  HPI Comments: "It looks so much better"  Follow up sub 1st MPJ right foot Patient returns the office today for followup evaluation of right plantar foot ulceration. He is to continue with the Silvadene dressing changes daily. Denies any systemic complaints such as fevers, chills, nausea, vomiting. No other complaints at this time. Continues to wear surgical shoe with offloading.      Review of Systems  Skin:       Ulcer right   All other systems reviewed and are negative.      Objective:   Physical Exam  DP/PT pulses palpable b/l, CRT < 3 sec. Neurological status unchanged. Right submetatarsal 1 ulcer decreased in size. Today it measures 1.2x1cm, superficial with granular base. No probing or undermining. Periwound skin is hyperkerotic. No ascending cellulitis, no drainage/purulence, pain. No periwound erythema.  Left submetatarsal 1 preulcerative lesion. No clinical signs of infection.       Assessment & Plan:  58 year old male with healing right submetatarsal one ulceration, preulcerative lesion left submetatarsal one in the setting of hallux limitus b/l R>L -Right foot submetatarsal wound sharply debrided of any hyperkeratotic tissue. Wound appears to be healing at this time.  -Continue with daily dressing changes the right foot. -Discussed the importance of daily inspection specifically over his left foot to ensure the pre-ulcerative lesion submetatarsal one does not breakdown -If there are any changes or worsening of symptoms or any clinical or signs of infection the patient started to call the office immediately or go to the emergency room. -At the next visit will likely scan the patient for custom orthotics and/or diabetic insoles do to increase pressure bilateral submetatarsal 1 in the setting of hallux limitus/flatfoot.

## 2014-07-14 ENCOUNTER — Encounter (HOSPITAL_COMMUNITY): Payer: Self-pay | Admitting: Emergency Medicine

## 2014-07-14 ENCOUNTER — Emergency Department (HOSPITAL_COMMUNITY)
Admission: EM | Admit: 2014-07-14 | Discharge: 2014-07-14 | Disposition: A | Discharge status: Home / Self Care | Payer: 59 | Attending: Emergency Medicine | Admitting: Emergency Medicine

## 2014-07-14 DIAGNOSIS — R6889 Other general symptoms and signs: Secondary | ICD-10-CM | POA: Insufficient documentation

## 2014-07-14 DIAGNOSIS — I10 Essential (primary) hypertension: Secondary | ICD-10-CM | POA: Diagnosis not present

## 2014-07-14 DIAGNOSIS — I2510 Atherosclerotic heart disease of native coronary artery without angina pectoris: Secondary | ICD-10-CM | POA: Diagnosis not present

## 2014-07-14 DIAGNOSIS — Z8781 Personal history of (healed) traumatic fracture: Secondary | ICD-10-CM | POA: Diagnosis not present

## 2014-07-14 DIAGNOSIS — E119 Type 2 diabetes mellitus without complications: Secondary | ICD-10-CM | POA: Insufficient documentation

## 2014-07-14 DIAGNOSIS — Z8719 Personal history of other diseases of the digestive system: Secondary | ICD-10-CM | POA: Insufficient documentation

## 2014-07-14 DIAGNOSIS — E782 Mixed hyperlipidemia: Secondary | ICD-10-CM | POA: Insufficient documentation

## 2014-07-14 DIAGNOSIS — F411 Generalized anxiety disorder: Secondary | ICD-10-CM | POA: Diagnosis not present

## 2014-07-14 DIAGNOSIS — E871 Hypo-osmolality and hyponatremia: Secondary | ICD-10-CM

## 2014-07-14 DIAGNOSIS — Z7982 Long term (current) use of aspirin: Secondary | ICD-10-CM | POA: Insufficient documentation

## 2014-07-14 DIAGNOSIS — Z951 Presence of aortocoronary bypass graft: Secondary | ICD-10-CM | POA: Insufficient documentation

## 2014-07-14 DIAGNOSIS — Z792 Long term (current) use of antibiotics: Secondary | ICD-10-CM | POA: Insufficient documentation

## 2014-07-14 LAB — CBC WITH DIFFERENTIAL/PLATELET
BASOS PCT: 0 % (ref 0–1)
Basophils Absolute: 0 10*3/uL (ref 0.0–0.1)
EOS ABS: 0.3 10*3/uL (ref 0.0–0.7)
Eosinophils Relative: 3 % (ref 0–5)
HEMATOCRIT: 38.4 % — AB (ref 39.0–52.0)
HEMOGLOBIN: 14.1 g/dL (ref 13.0–17.0)
LYMPHS ABS: 2.4 10*3/uL (ref 0.7–4.0)
Lymphocytes Relative: 19 % (ref 12–46)
MCH: 29.1 pg (ref 26.0–34.0)
MCHC: 36.7 g/dL — AB (ref 30.0–36.0)
MCV: 79.2 fL (ref 78.0–100.0)
MONO ABS: 0.8 10*3/uL (ref 0.1–1.0)
MONOS PCT: 7 % (ref 3–12)
NEUTROS ABS: 9 10*3/uL — AB (ref 1.7–7.7)
Neutrophils Relative %: 71 % (ref 43–77)
Platelets: 210 10*3/uL (ref 150–400)
RBC: 4.85 MIL/uL (ref 4.22–5.81)
RDW: 13.4 % (ref 11.5–15.5)
WBC: 12.6 10*3/uL — ABNORMAL HIGH (ref 4.0–10.5)

## 2014-07-14 LAB — COMPREHENSIVE METABOLIC PANEL
ALBUMIN: 4.5 g/dL (ref 3.5–5.2)
ALK PHOS: 45 U/L (ref 39–117)
ALT: 29 U/L (ref 0–53)
ANION GAP: 17 — AB (ref 5–15)
AST: 26 U/L (ref 0–37)
BILIRUBIN TOTAL: 0.6 mg/dL (ref 0.3–1.2)
BUN: 27 mg/dL — AB (ref 6–23)
CHLORIDE: 84 meq/L — AB (ref 96–112)
CO2: 20 mEq/L (ref 19–32)
CREATININE: 1.78 mg/dL — AB (ref 0.50–1.35)
Calcium: 9.9 mg/dL (ref 8.4–10.5)
GFR calc non Af Amer: 41 mL/min — ABNORMAL LOW (ref >90)
GFR, EST AFRICAN AMERICAN: 47 mL/min — AB (ref >90)
GLUCOSE: 116 mg/dL — AB (ref 70–99)
Potassium: 3.6 mEq/L — ABNORMAL LOW (ref 3.7–5.3)
Sodium: 121 mEq/L — CL (ref 137–147)
Total Protein: 7.6 g/dL (ref 6.0–8.3)

## 2014-07-14 LAB — I-STAT CHEM 8, ED
BUN: 25 mg/dL — ABNORMAL HIGH (ref 6–23)
CALCIUM ION: 1.16 mmol/L (ref 1.12–1.23)
CHLORIDE: 95 meq/L — AB (ref 96–112)
Creatinine, Ser: 1.7 mg/dL — ABNORMAL HIGH (ref 0.50–1.35)
GLUCOSE: 84 mg/dL (ref 70–99)
HEMATOCRIT: 41 % (ref 39.0–52.0)
HEMOGLOBIN: 13.9 g/dL (ref 13.0–17.0)
Potassium: 3.4 mEq/L — ABNORMAL LOW (ref 3.7–5.3)
Sodium: 124 mEq/L — ABNORMAL LOW (ref 137–147)
TCO2: 19 mmol/L (ref 0–100)

## 2014-07-14 LAB — CBG MONITORING, ED: GLUCOSE-CAPILLARY: 118 mg/dL — AB (ref 70–99)

## 2014-07-14 MED ORDER — SODIUM CHLORIDE 0.9 % IV BOLUS (SEPSIS)
1000.0000 mL | Freq: Once | INTRAVENOUS | Status: AC
  Administered 2014-07-14: 1000 mL via INTRAVENOUS

## 2014-07-14 MED ORDER — OLMESARTAN MEDOXOMIL 40 MG PO TABS: 40.0000 mg | ORAL_TABLET | Freq: Every day | ORAL | Status: DC

## 2014-07-14 NOTE — ED Notes (Signed)
Called Dr. Elesa Massed and reveiwed the plan of care.  Starting 2nd bolus, and patient is requesting diet soda.  MD allows patient to have diet drink, and as soon as bolus is finished, will prepare for discharge.

## 2014-07-14 NOTE — ED Notes (Signed)
CBG 118 

## 2014-07-14 NOTE — Discharge Instructions (Signed)
Your sodium today was 121. We gave you IV fluids and improved to 124. We gave you a second bolus of IV fluids. I recommended you hold your Benicar at this time as hydrochlorothiazide may make your sodium drop. I also recommended she return to having a normal diet and avoid your diet shakes. Please return to your primary care physician's office to have your sodium rechecked in 24-48 hours.   Hyponatremia  Hyponatremia is when the amount of salt (sodium) in your blood is too low. When sodium levels are low, your cells will absorb extra water and swell. The swelling happens throughout the body, but it mostly affects the brain. Severe brain swelling (cerebral edema), seizures, or coma can happen.  CAUSES   Heart, kidney, or liver problems.  Thyroid problems.  Adrenal gland problems.  Severe vomiting and diarrhea.  Certain medicines or illegal drugs.  Dehydration.  Drinking too much water.  Low-sodium diet. SYMPTOMS   Nausea and vomiting.  Confusion.  Lethargy.  Agitation.  Headache.  Twitching or shaking (seizures).  Unconsciousness.  Appetite loss.  Muscle weakness and cramping. DIAGNOSIS  Hyponatremia is identified by a simple blood test. Your caregiver will perform a history and physical exam to try to find the cause and type of hyponatremia. Other tests may be needed to measure the amount of sodium in your blood and urine. TREATMENT  Treatment will depend on the cause.   Fluids may be given through the vein (IV).  Medicines may be used to correct the sodium imbalance. If medicines are causing the problem, they will need to be adjusted.  Water or fluid intake may be restricted to restore proper balance. The speed of correcting the sodium problem is very important. If the problem is corrected too fast, nerve damage (sometimes unchangeable) can happen. HOME CARE INSTRUCTIONS   Only take medicines as directed by your caregiver. Many medicines can make hyponatremia  worse. Discuss all your medicines with your caregiver.  Carefully follow any recommended diet, including any fluid restrictions.  You may be asked to repeat lab tests. Follow these directions.  Avoid alcohol and recreational drugs. SEEK MEDICAL CARE IF:   You develop worsening nausea, fatigue, headache, confusion, or weakness.  Your original hyponatremia symptoms return.  You have problems following the recommended diet. SEEK IMMEDIATE MEDICAL CARE IF:   You have a seizure.  You faint.  You have ongoing diarrhea or vomiting. MAKE SURE YOU:   Understand these instructions.  Will watch your condition.  Will get help right away if you are not doing well or get worse. Document Released: 10/27/2002 Document Revised: 01/29/2012 Document Reviewed: 04/23/2011 [facility] Patient Information 2015 [place], [place]. This information is not intended to replace advice given to you by your health care provider. Make sure you discuss any questions you have with your health care provider.

## 2014-07-14 NOTE — ED Provider Notes (Signed)
TIME SEEN: 7:45 PM  CHIEF COMPLAINT: Abnormal labs  HPI: Pt is a 58 y.o. M with history of coronary artery disease, hypertension, diabetes, hyperlipidemia who presents to the emergency department with complaints of abnormal labs. He states that he was seen in his doctor's office a week ago and had labs that showed a sodium of 127. He was told to come back in one week to have this rechecked. His sodium today was 121 and he was instructed to come to the emergency department. He denies any symptoms. He states he has been taking his Benicar. He is also been on a diet where he has been drinking shakes over the past several days. Denies any confusion, seizures, chest pain or shortness of breath, vomiting. He has had some diarrhea being on this diet.  ROS: See HPI Constitutional: no fever  Eyes: no drainage  ENT: no runny nose   Cardiovascular:  no chest pain  Resp: no SOB  GI: no vomiting GU: no dysuria Integumentary: no rash  Allergy: no hives  Musculoskeletal: no leg swelling  Neurological: no slurred speech ROS otherwise negative  PAST MEDICAL HISTORY/PAST SURGICAL HISTORY:  Past Medical History  Diagnosis Date  . CAD (coronary artery disease)     CABG x 5, LIMA to LAD, Free radical to OM, SVG seq. to PDA /PLOM, and SVG to R1 - 02/2010  . Essential hypertension, benign   . Type II or unspecified type diabetes mellitus without mention of complication, not stated as uncontrolled   . Sleep apnea     dx but does not use CPAP (Dr.Henry Smitt O/V note 05/13/13 states sleep apnea- on CPAP)  . Coronary atherosclerosis of native coronary artery     Asymptomatic  . Anxiety state, unspecified   . Mixed hyperlipidemia   . ED (erectile dysfunction)   . Fractured toe 05/22/13    Right foot, s/p OPEN REDUCTION INTERNAL FIXATION (ORIF)  . Pancreatitis 2005    MEDICATIONS:  Prior to Admission medications   Medication Sig Start Date End Date Taking? Authorizing Provider  ALPRAZolam Prudy Feeler) 0.5 MG  tablet Take 0.5 mg by mouth 2 (two) times daily as needed for anxiety.    Historical Provider, MD  aspirin 325 MG tablet Take 325 mg by mouth daily.    Historical Provider, MD  atorvastatin (LIPITOR) 80 MG tablet Take 80 mg by mouth daily.    Historical Provider, MD  buPROPion (WELLBUTRIN XL) 300 MG 24 hr tablet Take 300 mg by mouth daily.    Historical Provider, MD  cephALEXin (KEFLEX) 500 MG capsule Take 1 capsule (500 mg total) by mouth 4 (four) times daily. 04/22/13   Arman Filter, NP  fish oil-omega-3 fatty acids 1000 MG capsule Take 1 g by mouth 2 (two) times daily.    Historical Provider, MD  HYDROcodone-acetaminophen (NORCO) 5-325 MG per tablet Take 1-2 tablets by mouth every 6 (six) hours as needed for pain. 05/22/13   Toni Arthurs, MD  Liraglutide (VICTOZA) 18 MG/3ML SOPN Inject 1.8 mg into the skin daily.    Historical Provider, MD  metoprolol (LOPRESSOR) 50 MG tablet Take 50 mg by mouth 2 (two) times daily.    Historical Provider, MD  olmesartan-hydrochlorothiazide (BENICAR HCT) 40-25 MG per tablet Take 1 tablet by mouth daily.    Historical Provider, MD  omeprazole (PRILOSEC) 20 MG capsule Take 20 mg by mouth every evening.    Historical Provider, MD  sildenafil (VIAGRA) 100 MG tablet Take 100 mg by mouth daily  as needed for erectile dysfunction.    Historical Provider, MD  silver sulfADIAZINE (SILVADENE) 1 % cream Apply 1 application topically daily. 07/03/14   Ovid Curd, DPM    ALLERGIES:  No Known Allergies  SOCIAL HISTORY:  History  Substance Use Topics  . Smoking status: Never Smoker   . Smokeless tobacco: Not on file  . Alcohol Use: Yes     Comment: beer    FAMILY HISTORY: Family History  Problem Relation Age of Onset  . Cancer Mother     lung  . Hypertension Father   . CAD Father   . CVA Brother     EXAM: BP 161/88  Pulse 82  Temp(Src) 98.2 F (36.8 C) (Oral)  Resp 20  Wt 243 lb (110.224 kg)  SpO2 100% CONSTITUTIONAL: Alert and oriented and responds  appropriately to questions. Well-appearing; well-nourished HEAD: Normocephalic EYES: Conjunctivae clear, PERRL ENT: normal nose; no rhinorrhea; moist mucous membranes; pharynx without lesions noted NECK: Supple, no meningismus, no LAD  CARD: RRR; S1 and S2 appreciated; no murmurs, no clicks, no rubs, no gallops RESP: Normal chest excursion without splinting or tachypnea; breath sounds clear and equal bilaterally; no wheezes, no rhonchi, no rales,  ABD/GI: Normal bowel sounds; non-distended; soft, non-tender, no rebound, no guarding BACK:  The back appears normal and is non-tender to palpation, there is no CVA tenderness EXT: Normal ROM in all joints; non-tender to palpation; no edema; normal capillary refill; no cyanosis    SKIN: Normal color for age and race; warm NEURO: Moves all extremities equally, sensation light touch diffusely, cranial nerves 2 through contact PSYCH: The patient's mood and manner are appropriate. Grooming and personal hygiene are appropriate.  MEDICAL DECISION MAKING: Patient here with hyponatremia. He is otherwise asymptomatic. Suspect this may be from his recent change in diet and his hydrochlorothiazide use. We'll give liter saline bolus and recheck his sodium level. He would like discharge home if possible. If his sodium improves and he is still asymptomatic, we'll recommend that he hold his hydrochlorothiazide and followup with his PCP.  ED PROGRESS: Sodium has improved to 124 after IV fluids. We'll give second bolus. He states he would prefer discharge home for admission. We'll have him hold his hydrochlorothiazide. We'll continue his Olmesartan 40 mg daily. We'll give him a new prescription for this medication. Discussed with him that I recommend holding his shakes but he is dieting with at this time and continue a regular diet. We'll have him followup with his PCP in 24-48 hours to have his sodium rechecked. Discussed return precautions. He verbalizes understanding and  is comfortable with plan.     [name] Maw Andrianna Manalang, DO 07/14/14 2229

## 2014-07-14 NOTE — ED Notes (Signed)
Pt sent over from doctors office due to sodium level being 121 from labs that were taken today, states the labs were normal checks, denies any symptoms

## 2014-07-14 NOTE — ED Notes (Signed)
Clarified with Dr. Elesa Massed that patient is to receive NS bolus with Na+ level of 121 instead of NS 3% fluid.  MD advises that normal saline has enough in solution to hang and correct.

## 2014-07-17 ENCOUNTER — Ambulatory Visit (INDEPENDENT_AMBULATORY_CARE_PROVIDER_SITE_OTHER): Payer: 59 | Admitting: Podiatry

## 2014-07-17 VITALS — BP 125/71 | HR 66 | Temp 97.3°F | Resp 16

## 2014-07-17 DIAGNOSIS — L97509 Non-pressure chronic ulcer of other part of unspecified foot with unspecified severity: Secondary | ICD-10-CM

## 2014-07-17 NOTE — Patient Instructions (Signed)
Continue with daily dressing change. Monitor for any signs/symptoms of infection. Call the office immediately if any occur or go directly to the emergency room. Call with any questions/concerns.

## 2014-07-19 ENCOUNTER — Encounter: Payer: Self-pay | Admitting: Podiatry

## 2014-07-19 NOTE — Progress Notes (Signed)
Patient ID: [name] [name], male   DOB: 11-14-1956, 58 y.o.   MRN: [id number]  58 year old male returns the office today for follow-up evaluation of right side first metatarsal phalangeal joint ulceration. Patient states that he is continuing with the Silvadene cream and daily dressing changes. He does state that she keeps it uncovered periodically. Denies any systemic complaints such as fevers, chills, nausea, vomiting. No other complaints  Objective: AAO x3, NAD DP/PT pulse palpable b/l. CRT < 3sec Decreased protective sensation. Right sub 1st MTPJ ulceration. There is hyperkeratotic tissue over the wound and the periwound. Upon debridement of the hyperkeratotic tissue the periwound is macerated. Wound measures approximately the same size 1.2 x 1 x 0.3 cm. Wound base is granular. There is no probing to bone, no undermining or tunneling. No surrounding erythema, drainage, fluctuance, crepitus. No ascending cellulitis.  Left sub 1st MTPJ pre-ulcerative lesion, unchanged. No clinical signs of infection.   Assessment:  58 -year-old male right side first metatarsal phalangeal joint ulceration, left sub-first MTPJ pre-ulcerative lesion.  Plan: -Conservative versus surgical intervention was discussed with the patient in detail including alternatives, risks, complications. -Wound sharply debrided to healthy granular tissue without complication. -Continue with surgical shoe/offloading -Continue daily dressing changes. While the periwound is macerated recommended a dry dressing however once dried out can continue with Silvadene.  -Patient states that he often showers with the wound uncovered. We discussed in order to keep the wound clean and clean it with antibacterial soap, and dry the wound clean. Would not recommend soaking the foot for periods of time. -Will continue with local wound care for now.  -Discussed offloading orthotics once wound heals. Pads for now for the left foot to offload the first  MTPJ pre-ulcerative lesion. -Followup in one week or sooner if any problems are to arise or any change in symptoms. Monitor for any signs or symptoms of infection and directed to call the office or go directly to the emergency room if any are to occur.  -X-ray right foot next visit.

## 2014-07-24 ENCOUNTER — Encounter: Payer: Self-pay | Admitting: Podiatry

## 2014-07-24 ENCOUNTER — Ambulatory Visit (INDEPENDENT_AMBULATORY_CARE_PROVIDER_SITE_OTHER): Payer: 59 | Admitting: Podiatry

## 2014-07-24 ENCOUNTER — Ambulatory Visit: Payer: 59 | Admitting: Podiatry

## 2014-07-24 VITALS — BP 151/100 | HR 75 | Resp 18

## 2014-07-24 DIAGNOSIS — L97509 Non-pressure chronic ulcer of other part of unspecified foot with unspecified severity: Secondary | ICD-10-CM

## 2014-07-24 NOTE — Patient Instructions (Signed)
Continue daily dressing changes. Monitor for any signs/symptoms of infection. Call the office immediately if any occur or go directly to the emergency room. Call with any questions/concerns.  

## 2014-07-24 NOTE — Progress Notes (Signed)
   Subjective:    Patient ID: [name] [name], male    DOB: [birth year], 58 y.o.   MRN: [id number]  HPI "my right foot feels so much better and looks good"  58 year old male returns the office today for followup evaluation of right submetatarsal one ulceration. Patient states that he continues with daily dressing changes, antibiotic soap washes. Denies any systemic complaints such as fevers, chills, nausea, vomiting. No new complaints.    Review of Systems  Skin: Positive for wound.  All other systems reviewed and are negative.      Objective:   Physical Exam AAO x3, NAD DP/PT pulses palpable 2/4 b/l. CRT < 3sec. Decreased protective sensation. Right submetatarsal one ulceration. Wound currently is approximately 1 x 0.2cm, appears almost to be like a fissure. Wound base is granular. Periwound hyperkerotic. No surrounding erythema, drainage, fluctuance, crepitus. No probing bone, no toggling, no undermining. Left  submetatarsal one pre-ulcerative lesion. Bilateral hallux limitus. No leg pain, swelling, warmth.       Assessment & Plan:  58 year old male followup evaluation right submetatarsal 1 ulceration (healing), left submetatarsal one pre-ulcerative lesion. -Conservative versus surgical intervention were discussed with the patient in detail including alternatives, risks, complications. -Wound right foot sharply debrided without complications. -Continue with daily dressing changes. Can wash with mild antibacterial soap. Can apply a small amount of interbody ointment followed by a dressing. Continue with surgical shoe with offloading pads to take pressure off the area.  -Continue to monitor left foot for any further skin breakdown. -Follow up in 1 week. Hopefully at the next appointment we can get him scanned for custom inserts to help offload the high pressure areas to prevent further skin breakdown. Monitor for any clinical signs or symptoms of infection and directed to call the  office immediately or go directly to the emergency room if any are to occur. Call with any questions or concerns or change in symptoms

## 2014-07-26 ENCOUNTER — Encounter: Payer: Self-pay | Admitting: Podiatry

## 2014-07-31 ENCOUNTER — Ambulatory Visit: Payer: 59 | Admitting: Podiatry

## 2014-08-03 ENCOUNTER — Ambulatory Visit: Payer: 59 | Admitting: Interventional Cardiology

## 2014-08-07 ENCOUNTER — Encounter: Payer: Self-pay | Admitting: Podiatry

## 2014-08-07 ENCOUNTER — Ambulatory Visit (INDEPENDENT_AMBULATORY_CARE_PROVIDER_SITE_OTHER): Payer: 59 | Admitting: Podiatry

## 2014-08-07 VITALS — BP 138/86 | HR 74 | Resp 18

## 2014-08-07 DIAGNOSIS — L97509 Non-pressure chronic ulcer of other part of unspecified foot with unspecified severity: Secondary | ICD-10-CM

## 2014-08-07 NOTE — Patient Instructions (Signed)
Monitor for any signs/symptoms of infection. Call the office immediately if any occur or go directly to the emergency room. Call with any questions/concerns.  

## 2014-08-07 NOTE — Progress Notes (Signed)
   Subjective:    Patient ID: [name] [name], male    DOB: September 21, 1956, 58 y.o.   MRN: [id number]  HPI I AM DOING BETTER ON THE BALL OF MY RIGHT FOOT AND I AM TRYING TO GET MY WALKING IN AND I AM TRYING TO BUILD UP ON MY FOOT  Mr. Gleaves, 58 year old male, turns the office they for followup evaluation of right submetatarsal one hyperkeratotic lesion. States that he missed last weeks appointment to previous engagement. He has since discontinued wearing a surgical shoe on his own has gone back into a regular sneaker. He also states that he has been increasing his walking. He tries to offload the area and his shoe with padding. He's been continuing with washing the wound with antibacterial soap been applying antibiotic ointment and a bandage. He does sometimes go without a bandage and leaving uncovered. Denies any systemic complaints as fevers, chills, nausea, vomiting. No new complaints at this time. No acute changes since last appointment.    Review of Systems     Objective:   Physical Exam AAO x3, NAD DP/PT Pulses palpable b/l, CRT < 3 sec Decreased protective sensation with Dorann Ou monofilament Right submetatarsal one hyperkeratotic lesion with superficial ulceration on the central aspect. Wound measures approximately 0.5 x 0.3 cm. There is no probing, undermining, tunneling. No surrounding erythema, drainage, purulence, fluctuance, crepitus. No ascending cellulitis. No pain on palpation. No edema. Submetatarsal one pre-ulcerative lesion. No leg pain, swelling, warmth.      Assessment & Plan:  58 year old male with right submetatarsal one hyperkeratotic tissue with central ulceration which is decreased in size since last appointment; persistent left submetatarsal one pre-ulcerative lesion. -Conservative versus surgical treatment were discussed including alternatives, risks, complications. -Hyperkeratotic tissue right foot sharply debrided without complications. Wound debrided to  healthy granular tissue. -Continue with daily dressing changes. Can continue with antibiotic ointment and a bandage. Would recommend the patient continue wearing a surgical shoe with offloading. He wants to get back into his shoe and discussed that he does this he increases his risk of re-ulceration or developing a new alteration. Needs to offload the wound.  -Given the patient's history of diabetes, neuropathy, ulceration recommend diabetic inserts. We will work on precertification for diabetic inserts. Patient was measured today and molded his feet were obtained and the wound was outlined in order to appropriately offload the area. Once we get approval will send the molds to have the inserts made. -Followup in 2 weeks or sooner if any palms are to arise or any change in symptoms. Monitor for any clinical signs or symptoms of infection and directed to call the office immediately if any are to occur or go directly to the emergency room. In the meantime call any questions, concerns.

## 2014-08-09 ENCOUNTER — Encounter: Payer: Self-pay | Admitting: Podiatry

## 2014-08-21 ENCOUNTER — Ambulatory Visit (INDEPENDENT_AMBULATORY_CARE_PROVIDER_SITE_OTHER): Payer: 59 | Admitting: Podiatry

## 2014-08-21 ENCOUNTER — Encounter: Payer: Self-pay | Admitting: Podiatry

## 2014-08-21 VITALS — BP 116/66 | HR 74 | Resp 15

## 2014-08-21 DIAGNOSIS — L84 Corns and callosities: Secondary | ICD-10-CM

## 2014-08-21 DIAGNOSIS — L97511 Non-pressure chronic ulcer of other part of right foot limited to breakdown of skin: Secondary | ICD-10-CM

## 2014-08-22 ENCOUNTER — Encounter: Payer: Self-pay | Admitting: Podiatry

## 2014-08-22 DIAGNOSIS — L97509 Non-pressure chronic ulcer of other part of unspecified foot with unspecified severity: Secondary | ICD-10-CM | POA: Insufficient documentation

## 2014-08-22 NOTE — Progress Notes (Signed)
Patient ID: [name] [name], male   DOB: [birth year], 58 y.o.   MRN: [id number]  Subjective: [name] returns the office they for followup evaluation of right submetatarsal 1 hyperkeratotic lesion/ulceration. He states that he's been continuing wearing a sneaker. He does state over the last week he has not been very diligent about offloading the wound. He denies any systemic complaints such as fevers, chills, nausea, vomiting. He has not been applying any ointment to the area. No other complaints at this time.  Objective: AAO x3, NAD DP/PT pulses palpable bilaterally, CRT less than 3 seconds Decreased protective sensation with Simms Weinstein monofilament. Right submetatarsal one hyperkeratotic lesion. Upon debridement there is a recurrence of the ulceration measuring approximately 0.6 x 0.5 cm with granular wound base. There is no probing, undermining, tunneling. No surrounding erythema, drainage, purulence, crepitus, fluctuance, malodor. No ascending cellulitis. Left submetatarsal 1 hyperkeratotic lesion. No surrounding erythema or drainage or other clinical signs of infection. No calf pain, swelling, warmth.  Assessment: 58 year old male with recurrence of right submetatarsal one ulceration, pre-ulcerative callus left submetatarsal one  Plan: -Conservative versus surgical treatment were discussed including alternatives, risks, complications. -Discussed with the patient at his longus he continues to put pressure over the area of the wound is in a continuously open and close. We are still awaiting precertification for diabetic insert/shoes. Recommended the patient go back into a surgical shoe with offloading pads to help take the pressure off of lesion. Discussed with the patient if the wound remains open will likely referred to the wound care center. -Right submetatarsal 1 hyperkeratotic lesion/wound sharply debrided without complications. We are debrided to healthy bleeding granular tissue. No  clinical signs of infection. Continue with a right ointment and a bandage daily. Clean wound with antibacterial soap daily, dried thoroughly and apply antibiotic ointment and a dressing daily. Monitor for signs or symptoms of infection and directed to call the office immediately if any are to occur or go directly to the emergency room. -Left submetatarsal 1 hyperkeratotic lesion sharply debrided without complications. -Followup In 2 weeks or sooner if any problems are to arise or any change in symptoms. In the meantime call any questions, concerns.

## 2014-09-04 ENCOUNTER — Ambulatory Visit: Payer: 59 | Admitting: Podiatry

## 2014-09-11 ENCOUNTER — Ambulatory Visit: Payer: 59 | Admitting: Podiatry

## 2014-09-18 ENCOUNTER — Ambulatory Visit (INDEPENDENT_AMBULATORY_CARE_PROVIDER_SITE_OTHER): Payer: 59 | Admitting: Podiatry

## 2014-09-18 DIAGNOSIS — E1149 Type 2 diabetes mellitus with other diabetic neurological complication: Secondary | ICD-10-CM

## 2014-09-18 DIAGNOSIS — L84 Corns and callosities: Secondary | ICD-10-CM

## 2014-09-18 DIAGNOSIS — E1140 Type 2 diabetes mellitus with diabetic neuropathy, unspecified: Secondary | ICD-10-CM

## 2014-09-18 DIAGNOSIS — L97511 Non-pressure chronic ulcer of other part of right foot limited to breakdown of skin: Secondary | ICD-10-CM

## 2014-09-21 NOTE — Progress Notes (Signed)
Patient ID: [name] [name], male   DOB: [birth year], 58 y.o.   MRN: [id number]  Subjective: [name], 58 year old male, returns to the office today for follow-up evaluation said metatarsal 1 wound on the right foot. He states that he has been applying a dressing daily at times. He states that he often goes without a bandage. Also states that he has not been offloading the wound in his shoes. He has been wearing a regular sneaker. He also states that since last appointment he has gone to the shoe market and has purchased new shoes. He also states that he has been putting wedges into the heels of his shoes due to plantar fasciitis. He was wondering if this is increasing the pressure on the wound and the forefoot. He denies any recent redness or drainage from the site. No other complaints at this time. Denies any acute changes since last appointment. Denies any systemic complaints such as fevers, chills, nausea, vomiting.   Objective: AAO x3, NAD DP/PT pulses palpable bilaterally, CRT less than 3 seconds, +pedal hair Decreased protective sensation with Semmes-Weinstein monofilament. Right foot submetatarsal wide hyperkeratotic lesion with central ulceration. Upon debridement wound measures proximal 8.8 x 0.8 with a healthy granular wound base. There is no surrounding erythema, ascending cellulitis, drainage. There is no fluctuance or crepitus. No probing, undermining, tunneling. No other lesions identified on the right foot. On the left foot there is needle hyperkeratotic lesions with a thick lesion on the medial aspect of the left hallux at the level of the IPJ. There is evidence of dried blood underneath the callus. Upon debridement of this lesion there is a slight superficial opening in the skin without any surrounding erythema or drainage or other clinical signs of infection. There is rigid hammertoe contractures on the left foot due to her previous fractures. On the third digit there is a thick  hyperkeratotic lesion of the distal aspect of the digit. Upon debridement no underlying ulceration identified and no clinical signs of infection. No calf pain, swelling, warmth, erythema  Assessment: 58 year old male right foot submetatarsal 1 chronic ulceration due to pressure, new pre-ulcerative lesions left foot medial hallux, third distal digit.  Plan: -Conservative versus surgical treatment discussed including alternatives, risks, complications. -On the right foot wound on submetatarsal one sharply debrided of all hyperkeratotic tissue down to bleeding, granular, healthy wound base. Continue with daily dressing changes. Also would recommend the patient continue to wear the surgical shoe with offloading pads to help take the pressure off the area.continue daily dressing changes. He has Silvadene at home and I discussed that he should return to using this daily. Continue with washing the wounds with antibacterial soap and dried thoroughly and apply a bandage. -On the left foot hyperkeratotic lesions medial hallux and distal third digit sharply debrided without complications. Submetatarsal one on the left foot without any significant buildup at this time. Continue antibiotic ointment and a Band-Aid over the medial hallux pre-ulcerative site. -Upon evaluaiton of the patient's shoes he has 2 inserts for the heels and both issues. I removed these from his shoes as these are likely contributing to the increased forefoot pressure. I also discussed with him that it seems suspect that he has new lesions on his left foot after purchasing new shoes. I discussed with him the shoes most likely are not fitting appropriately and needs to have his shoes reevaluated. He has been fitted for diabetic shoes however he needs to pick out the shoes that he wants before they can  be ordered. -Monitor for any clinical signs or symptoms of infection and directed to call the office immediately if any are to occur go directly to  the emergency room. -Follow-up in 2 weeks or sooner if any problems are to arise or any change in symptoms. In the meantime call the office with any questions, concerns.

## 2014-10-02 ENCOUNTER — Ambulatory Visit: Payer: 59 | Admitting: Podiatry

## 2014-10-09 ENCOUNTER — Ambulatory Visit: Payer: 59 | Admitting: Podiatry

## 2014-10-14 ENCOUNTER — Ambulatory Visit: Payer: 59 | Admitting: Interventional Cardiology

## 2014-10-30 ENCOUNTER — Ambulatory Visit: Payer: 59 | Admitting: Podiatry

## 2014-11-04 ENCOUNTER — Ambulatory Visit: Payer: 59 | Admitting: Podiatry

## 2014-11-27 ENCOUNTER — Ambulatory Visit: Payer: 59 | Admitting: Podiatry

## 2014-12-17 ENCOUNTER — Ambulatory Visit: Payer: 59 | Admitting: Interventional Cardiology

## 2015-02-15 ENCOUNTER — Ambulatory Visit: Payer: Self-pay | Admitting: Interventional Cardiology

## 2015-02-24 ENCOUNTER — Ambulatory Visit: Payer: Self-pay | Admitting: Interventional Cardiology

## 2015-04-21 DIAGNOSIS — I10 Essential (primary) hypertension: Secondary | ICD-10-CM | POA: Insufficient documentation

## 2015-04-21 DIAGNOSIS — I25810 Atherosclerosis of coronary artery bypass graft(s) without angina pectoris: Secondary | ICD-10-CM | POA: Insufficient documentation

## 2015-04-21 DIAGNOSIS — E119 Type 2 diabetes mellitus without complications: Secondary | ICD-10-CM | POA: Insufficient documentation

## 2015-04-21 DIAGNOSIS — E785 Hyperlipidemia, unspecified: Secondary | ICD-10-CM | POA: Insufficient documentation

## 2015-04-22 ENCOUNTER — Ambulatory Visit (INDEPENDENT_AMBULATORY_CARE_PROVIDER_SITE_OTHER): Payer: 59 | Admitting: Interventional Cardiology

## 2015-04-22 ENCOUNTER — Encounter: Payer: Self-pay | Admitting: Interventional Cardiology

## 2015-04-22 VITALS — BP 152/98 | HR 82 | Ht 73.0 in | Wt 244.4 lb

## 2015-04-22 DIAGNOSIS — I25810 Atherosclerosis of coronary artery bypass graft(s) without angina pectoris: Secondary | ICD-10-CM

## 2015-04-22 DIAGNOSIS — E785 Hyperlipidemia, unspecified: Secondary | ICD-10-CM | POA: Diagnosis not present

## 2015-04-22 DIAGNOSIS — E1159 Type 2 diabetes mellitus with other circulatory complications: Secondary | ICD-10-CM | POA: Diagnosis not present

## 2015-04-22 DIAGNOSIS — I25709 Atherosclerosis of coronary artery bypass graft(s), unspecified, with unspecified angina pectoris: Secondary | ICD-10-CM | POA: Diagnosis not present

## 2015-04-22 DIAGNOSIS — I10 Essential (primary) hypertension: Secondary | ICD-10-CM

## 2015-04-22 NOTE — Patient Instructions (Signed)
Medication Instructions:  Your physician recommends that you continue on your current medications as directed. Please refer to the Current Medication list given to you today.   Labwork: None   Testing/Procedures: Please keep your upcoming appointment for your sleep study  Follow-Up: Your physician wants you to follow-up in: 1 year with Dr.Smith You will receive a reminder letter in the mail two months in advance. If you don't receive a letter, please call our office to schedule the follow-up appointment.   Any Other Special Instructions Will Be Listed Below (If Applicable). Your physician discussed the importance of regular exercise and recommended that you start or continue a regular exercise program for good health.  Your physician encouraged you to lose weight for better health.     Low-Sodium Eating Plan Sodium raises blood pressure and causes water to be held in the body. Getting less sodium from food will help lower your blood pressure, reduce any swelling, and protect your heart, liver, and kidneys. We get sodium by adding salt (sodium chloride) to food. Most of our sodium comes from canned, boxed, and frozen foods. Restaurant foods, fast foods, and pizza are also very high in sodium. Even if you take medicine to lower your blood pressure or to reduce fluid in your body, getting less sodium from your food is important. WHAT IS MY PLAN? Most people should limit their sodium intake to 2,300 mg a day. Your health care provider recommends that you limit your sodium intake to __________ a day.  WHAT DO I NEED TO KNOW ABOUT THIS EATING PLAN? For the low-sodium eating plan, you will follow these general guidelines:  Choose foods with a % Daily Value for sodium of less than 5% (as listed on the food label).   Use salt-free seasonings or herbs instead of table salt or sea salt.   Check with your health care provider or pharmacist before using salt substitutes.   Eat fresh  foods.  Eat more vegetables and fruits.  Limit canned vegetables. If you do use them, rinse them well to decrease the sodium.   Limit cheese to 1 oz (28 g) per day.   Eat lower-sodium products, often labeled as "lower sodium" or "no salt added."  Avoid foods that contain monosodium glutamate (MSG). MSG is sometimes added to [place] food and some canned foods.  Check food labels (Nutrition Facts labels) on foods to learn how much sodium is in one serving.  Eat more home-cooked food and less restaurant, buffet, and fast food.  When eating at a restaurant, ask that your food be prepared with less salt or none, if possible.  HOW DO I READ FOOD LABELS FOR SODIUM INFORMATION? The Nutrition Facts label lists the amount of sodium in one serving of the food. If you eat more than one serving, you must multiply the listed amount of sodium by the number of servings. Food labels may also identify foods as:  Sodium free--Less than 5 mg in a serving.  Very low sodium--35 mg or less in a serving.  Low sodium--140 mg or less in a serving.  Light in sodium--50% less sodium in a serving. For example, if a food that usually has 300 mg of sodium is changed to become light in sodium, it will have 150 mg of sodium.  Reduced sodium--25% less sodium in a serving. For example, if a food that usually has 400 mg of sodium is changed to reduced sodium, it will have 300 mg of sodium. WHAT FOODS CAN I  EAT? Grains Low-sodium cereals, including oats, puffed wheat and rice, and shredded wheat cereals. Low-sodium crackers. Unsalted rice and pasta. Lower-sodium bread.  Vegetables Frozen or fresh vegetables. Low-sodium or reduced-sodium canned vegetables. Low-sodium or reduced-sodium tomato sauce and paste. Low-sodium or reduced-sodium tomato and vegetable juices.  Fruits Fresh, frozen, and canned fruit. Fruit juice.  Meat and Other Protein Products Low-sodium canned tuna and salmon. Fresh or frozen  meat, poultry, seafood, and fish. Lamb. Unsalted nuts. Dried beans, peas, and lentils without added salt. Unsalted canned beans. Homemade soups without salt. Eggs.  Dairy Milk. Soy milk. Ricotta cheese. Low-sodium or reduced-sodium cheeses. Yogurt.  Condiments Fresh and dried herbs and spices. Salt-free seasonings. Onion and garlic powders. Low-sodium varieties of mustard and ketchup. Lemon juice.  Fats and Oils Reduced-sodium salad dressings. Unsalted butter.  Other Unsalted popcorn and pretzels.  The items listed above may not be a complete list of recommended foods or beverages. Contact your dietitian for more options. WHAT FOODS ARE NOT RECOMMENDED? Grains Instant hot cereals. Bread stuffing, pancake, and biscuit mixes. Croutons. Seasoned rice or pasta mixes. Noodle soup cups. Boxed or frozen macaroni and cheese. Self-rising flour. Regular salted crackers. Vegetables Regular canned vegetables. Regular canned tomato sauce and paste. Regular tomato and vegetable juices. Frozen vegetables in sauces. Salted french fries. Olives. Rosita FirePickles. Relishes. Sauerkraut. Salsa. Meat and Other Protein Products Salted, canned, smoked, spiced, or pickled meats, seafood, or fish. Bacon, ham, sausage, hot dogs, corned beef, chipped beef, and packaged luncheon meats. Salt pork. Jerky. Pickled herring. Anchovies, regular canned tuna, and sardines. Salted nuts. Dairy Processed cheese and cheese spreads. Cheese curds. Blue cheese and cottage cheese. Buttermilk.  Condiments Onion and garlic salt, seasoned salt, table salt, and sea salt. Canned and packaged gravies. Worcestershire sauce. Tartar sauce. Barbecue sauce. Teriyaki sauce. Soy sauce, including reduced sodium. Steak sauce. Fish sauce. Oyster sauce. Cocktail sauce. Horseradish. Regular ketchup and mustard. Meat flavorings and tenderizers. Bouillon cubes. Hot sauce. Tabasco sauce. Marinades. Taco seasonings. Relishes. Fats and Oils Regular salad  dressings. Salted butter. Margarine. Ghee. Bacon fat.  Other Potato and tortilla chips. Corn chips and puffs. Salted popcorn and pretzels. Canned or dried soups. Pizza. Frozen entrees and pot pies.  The items listed above may not be a complete list of foods and beverages to avoid. Contact your dietitian for more information. Document Released: 04/28/2002 Document Revised: 11/11/2013 Document Reviewed: 09/10/2013 [facility] Patient Information 2015 [place] [place]. This information is not intended to replace advice given to you by your health care provider. Make sure you discuss any questions you have with your health care provider.

## 2015-04-22 NOTE — Progress Notes (Signed)
Cardiology Office Note   Date:  04/22/2015   ID:  [name] [name], DOB 12/09/1955, MRN [id number]  PCP:  [name] Hoff, MD  Cardiologist:  [name] Noe, MD   Chief Complaint  Patient presents with  . Coronary Artery Disease      History of Present Illness: [name] [name] is a 59 y.o. male who presents for who presents for coronary artery disease, hyperlipidemia, obesity, sleep apnea, and hypertension.  He denies angina. No claudication. He has not had syncope. The only new update is that he stopped taking amlodipine and some of his diabetes medications because he was on a "natural health and fitness program of his own direction". He denies dyspnea. He has not had syncope. He does not sleep well. He snores. No sleep apnea but is not treated.      Past Medical History  Diagnosis Date  . CAD (coronary artery disease)     CABG x 5, LIMA to LAD, Free radical to OM, SVG seq. to PDA /PLOM, and SVG to R1 - 02/2010  . Essential hypertension, benign   . Type II or unspecified type diabetes mellitus without mention of complication, not stated as uncontrolled   . Sleep apnea     dx but does not use CPAP (Dr.Henry [name] O/V note 05/13/13 states sleep apnea- on CPAP)  . Coronary atherosclerosis of native coronary artery     Asymptomatic  . Anxiety state, unspecified   . Mixed hyperlipidemia   . ED (erectile dysfunction)   . Fractured toe 05/22/13    Right foot, s/p OPEN REDUCTION INTERNAL FIXATION (ORIF)  . Pancreatitis 2005    Past Surgical History  Procedure Laterality Date  . Coronary artery bypass graft  02/2010    CABG x 5, LIMA to LAD, Free radical to OM, SVG seq. to PDA /PLOM, and SVG to R1   . Tonsillectomy    . Cardiac catheterization    . Orif toe fracture Left 05/22/2013    Procedure: OPEN REDUCTION INTERNAL FIXATION (ORIF) LEFT SECOND TOE PROXIMAL PHALANX FRACTURE/LEFT PERCUTANEOUS PINNING;  Surgeon: Toni Arthurs, MD;  Location: [facility] OR;  Service: Orthopedics;   Laterality: Left;  . Ureteral stent placement  2009     Current Outpatient Prescriptions  Medication Sig Dispense Refill  . ALPRAZolam (XANAX) 0.5 MG tablet Take 0.5 mg by mouth 2 (two) times daily as needed for anxiety.    Marland Kitchen amLODipine (NORVASC) 5 MG tablet Take 5 mg by mouth daily.     Marland Kitchen aspirin 325 MG tablet Take 325 mg by mouth daily.    Marland Kitchen atorvastatin (LIPITOR) 80 MG tablet Take 80 mg by mouth daily.    Marland Kitchen buPROPion (WELLBUTRIN XL) 300 MG 24 hr tablet Take 300 mg by mouth daily.    . fenofibrate (TRICOR) 145 MG tablet Take 145 mg by mouth daily.     . fish oil-omega-3 fatty acids 1000 MG capsule Take 1 g by mouth 2 (two) times daily.    . INVOKANA 300 MG TABS Take 300 mg by mouth daily before breakfast.     . Liraglutide (VICTOZA) 18 MG/3ML SOPN Inject 1.8 mg into the skin daily.    . metoprolol (LOPRESSOR) 50 MG tablet Take 50 mg by mouth 2 (two) times daily.    Marland Kitchen NOVOFINE 32G X 6 MM MISC     . olmesartan-hydrochlorothiazide (BENICAR HCT) 40-25 MG per tablet Take 1 tablet by mouth daily.    Marland Kitchen omeprazole (PRILOSEC) 20  MG capsule Take 20 mg by mouth every evening.    . sildenafil (VIAGRA) 100 MG tablet Take 100 mg by mouth daily as needed for erectile dysfunction.     No current facility-administered medications for this visit.    Allergies:   Review of patient's allergies indicates no known allergies.    Social History:  The patient  reports that he has never smoked. He does not have any smokeless tobacco history on file. He reports that he drinks alcohol. He reports that he does not use illicit drugs.   Family History:  The patient's family history includes CAD in his father; CVA in his brother; Cancer in his mother; Hypertension in his father.    ROS:  Please see the history of present illness.   Otherwise, review of systems are positive for discontinuation of some of his medications without medical advice. Not receiving therapy for sleep apnea..   All other systems are  reviewed and negative.    PHYSICAL EXAM: VS:  BP 152/98 mmHg  Pulse 82  Ht 6\' 1"  (1.854 m)  Wt 244 lb 6.4 oz (110.859 kg)  BMI 32.25 kg/m2 , BMI Body mass index is 32.25 kg/(m^2). GEN: Well nourished, well developed, in no acute distressPeriod obese HEENT: normal Neck: no JVD, carotid bruits, or masses Cardiac: RRR; no murmurs, rubs, or gallops,no edema  Respiratory:  clear to auscultation bilaterally, normal work of breathing GI: soft, nontender, nondistended, + BS MS: no deformity or atrophy Skin: warm and dry, no rash Neuro:  Strength and sensation are intact Psych: euthymic mood, full affect   EKG:  EKG is ordered today. The ekg ordered today demonstrates NSR with TWA in precordial leads   Recent Labs: 07/14/2014: ALT 29; BUN 25*; Creatinine 1.70*; Hemoglobin 13.9; Platelets 210; Potassium 3.4*; Sodium 124*    Lipid Panel No results found for: CHOL, TRIG, HDL, CHOLHDL, VLDL, LDLCALC, LDLDIRECT    Wt Readings from Last 3 Encounters:  04/22/15 244 lb 6.4 oz (110.859 kg)  07/14/14 243 lb (110.224 kg)  04/22/13 270 lb (122.471 kg)      Other studies Reviewed: Additional studies/ records that were reviewed today include: . Review of the above records demonstrates: [facility]. Will add to the updated problem list erectile dysfunction, and sleep apnea.   ASSESSMENT AND PLAN:  Coronary artery disease involving coronary bypass graft of native heart with unspecified angina pectoris: absent symptoms  Hyperlipidemia: elevated  Essential hypertension: elevated  Type 2 diabetes mellitus with other circulatory complications:      Current medicines are reviewed at length with the patient today.  The patient does not have concerns regarding medicines.  The following changes have been made:  Encouraged to get back on all of his medications, to follow a low-salt diet, exercise, lose weight, and resume therapy for sleep apnea  Labs/ tests ordered today include:    No orders of the defined types were placed in this encounter.     Disposition:   FU with HS in 1 year  Signed, [name] NoeSMITH III,[name] W, MD  04/22/2015 8:24 AM    [facility] [address], [place], [place]  [address] Phone: [phone]) [phone]; Fax: [phone]) [phone]

## 2015-07-09 ENCOUNTER — Telehealth: Payer: Self-pay | Admitting: *Deleted

## 2015-07-09 MED ORDER — SILVER SULFADIAZINE 1 % EX CREA
1.0000 | TOPICAL_CREAM | Freq: Every day | CUTANEOUS | Status: DC
Start: 2015-07-09 — End: 2018-04-18

## 2015-07-09 NOTE — Telephone Encounter (Addendum)
Pt states he has an appt 07/19/2015 with Dr. Ardelle Anton, but is running out of the Silvadene cream.  Dr. Ardelle Anton states order Silvadene Cream without refills.  I informed pt the cream was ordered at [facility].

## 2015-07-12 ENCOUNTER — Encounter (HOSPITAL_COMMUNITY): Payer: Self-pay | Admitting: Emergency Medicine

## 2015-07-12 ENCOUNTER — Telehealth: Payer: Self-pay | Admitting: *Deleted

## 2015-07-12 ENCOUNTER — Emergency Department (HOSPITAL_COMMUNITY)
Admission: EM | Admit: 2015-07-12 | Discharge: 2015-07-12 | Disposition: A | Discharge status: Home / Self Care | Payer: 59 | Attending: Emergency Medicine | Admitting: Emergency Medicine

## 2015-07-12 DIAGNOSIS — Z7982 Long term (current) use of aspirin: Secondary | ICD-10-CM | POA: Insufficient documentation

## 2015-07-12 DIAGNOSIS — L089 Local infection of the skin and subcutaneous tissue, unspecified: Secondary | ICD-10-CM | POA: Diagnosis present

## 2015-07-12 DIAGNOSIS — L97519 Non-pressure chronic ulcer of other part of right foot with unspecified severity: Secondary | ICD-10-CM | POA: Diagnosis not present

## 2015-07-12 DIAGNOSIS — I10 Essential (primary) hypertension: Secondary | ICD-10-CM | POA: Insufficient documentation

## 2015-07-12 DIAGNOSIS — I2510 Atherosclerotic heart disease of native coronary artery without angina pectoris: Secondary | ICD-10-CM | POA: Insufficient documentation

## 2015-07-12 DIAGNOSIS — F419 Anxiety disorder, unspecified: Secondary | ICD-10-CM | POA: Diagnosis not present

## 2015-07-12 DIAGNOSIS — E119 Type 2 diabetes mellitus without complications: Secondary | ICD-10-CM | POA: Insufficient documentation

## 2015-07-12 DIAGNOSIS — E11621 Type 2 diabetes mellitus with foot ulcer: Secondary | ICD-10-CM | POA: Insufficient documentation

## 2015-07-12 DIAGNOSIS — Z79899 Other long term (current) drug therapy: Secondary | ICD-10-CM | POA: Diagnosis not present

## 2015-07-12 DIAGNOSIS — E782 Mixed hyperlipidemia: Secondary | ICD-10-CM | POA: Insufficient documentation

## 2015-07-12 DIAGNOSIS — E13621 Other specified diabetes mellitus with foot ulcer: Secondary | ICD-10-CM

## 2015-07-12 NOTE — ED Provider Notes (Signed)
CSN: [id number]     Arrival date & time 07/12/15  0907 History   First MD Initiated Contact with Patient 07/12/15 1114     Chief Complaint  Patient presents with  . Skin Ulcer     (Consider location/radiation/quality/duration/timing/severity/associated sxs/prior Treatment) Patient is a 59 y.o. male presenting with wound check.  Wound Check This is a chronic problem. The current episode started more than 1 week ago. The problem occurs constantly. The problem has not changed since onset.Pertinent negatives include no chest pain, no abdominal pain and no shortness of breath. Nothing aggravates the symptoms. Nothing relieves the symptoms. He has tried nothing for the symptoms. The treatment provided no relief.    Past Medical History  Diagnosis Date  . CAD (coronary artery disease)     CABG x 5, LIMA to LAD, Free radical to OM, SVG seq. to PDA /PLOM, and SVG to R1 - 02/2010  . Essential hypertension, benign   . Type II or unspecified type diabetes mellitus without mention of complication, not stated as uncontrolled   . Sleep apnea     dx but does not use CPAP (Dr.Henry Smitt O/V note 05/13/13 states sleep apnea- on CPAP)  . Coronary atherosclerosis of native coronary artery     Asymptomatic  . Anxiety state, unspecified   . Mixed hyperlipidemia   . ED (erectile dysfunction)   . Fractured toe 05/22/13    Right foot, s/p OPEN REDUCTION INTERNAL FIXATION (ORIF)  . Pancreatitis 2005   Past Surgical History  Procedure Laterality Date  . Coronary artery bypass graft  02/2010    CABG x 5, LIMA to LAD, Free radical to OM, SVG seq. to PDA /PLOM, and SVG to R1   . Tonsillectomy    . Cardiac catheterization    . Orif toe fracture Left 05/22/2013    Procedure: OPEN REDUCTION INTERNAL FIXATION (ORIF) LEFT SECOND TOE PROXIMAL PHALANX FRACTURE/LEFT PERCUTANEOUS PINNING;  Surgeon: Toni Arthurs, MD;  Location: [facility] OR;  Service: Orthopedics;  Laterality: Left;  . Ureteral stent placement  2009   Family  History  Problem Relation Age of Onset  . Cancer Mother     lung  . Hypertension Father   . CAD Father   . CVA Brother    Social History  Substance Use Topics  . Smoking status: Never Smoker   . Smokeless tobacco: None  . Alcohol Use: Yes     Comment: beer    Review of Systems  Constitutional: Negative for fever and chills.  Eyes: Negative for pain.  Respiratory: Negative for cough and shortness of breath.   Cardiovascular: Negative for chest pain and palpitations.  Gastrointestinal: Negative for abdominal pain.  Skin: Positive for wound.  All other systems reviewed and are negative.     Allergies  Review of patient's allergies indicates no known allergies.  Home Medications   Prior to Admission medications   Medication Sig Start Date End Date Taking? Authorizing Provider  ALPRAZolam Prudy Feeler) 0.5 MG tablet Take 0.5 mg by mouth 2 (two) times daily as needed for anxiety.    Historical Provider, MD  amLODipine (NORVASC) 5 MG tablet Take 5 mg by mouth daily.  07/01/14   Historical Provider, MD  aspirin 325 MG tablet Take 325 mg by mouth daily.    Historical Provider, MD  atorvastatin (LIPITOR) 80 MG tablet Take 80 mg by mouth daily.    Historical Provider, MD  buPROPion (WELLBUTRIN XL) 300 MG 24 hr tablet Take 300 mg by mouth  daily.    Historical Provider, MD  fenofibrate (TRICOR) 145 MG tablet Take 145 mg by mouth daily.  07/01/14   Historical Provider, MD  fish oil-omega-3 fatty acids 1000 MG capsule Take 1 g by mouth 2 (two) times daily.    Historical Provider, MD  INVOKANA 300 MG TABS Take 300 mg by mouth daily before breakfast.  07/01/14   Historical Provider, MD  Liraglutide (VICTOZA) 18 MG/3ML SOPN Inject 1.8 mg into the skin daily.    Historical Provider, MD  metoprolol (LOPRESSOR) 50 MG tablet Take 50 mg by mouth 2 (two) times daily.    Historical Provider, MD  NOVOFINE 32G X 6 MM MISC  07/01/14   Historical Provider, MD  olmesartan-hydrochlorothiazide (BENICAR HCT)  40-25 MG per tablet Take 1 tablet by mouth daily.    Historical Provider, MD  omeprazole (PRILOSEC) 20 MG capsule Take 20 mg by mouth every evening.    Historical Provider, MD  sildenafil (VIAGRA) 100 MG tablet Take 100 mg by mouth daily as needed for erectile dysfunction.    Historical Provider, MD  silver sulfADIAZINE (SILVADENE) 1 % cream Apply 1 application topically daily. 07/09/15   Vivi Barrack, DPM   BP 112/72 mmHg  Pulse 74  Temp(Src) 98.8 F (37.1 C) (Oral)  Resp 17  Ht 6' (1.829 m)  Wt 240 lb (108.863 kg)  BMI 32.54 kg/m2  SpO2 99% Physical Exam  Constitutional: He is oriented to person, place, and time. He appears well-developed and well-nourished.  HENT:  Head: Normocephalic and atraumatic.  Eyes: Conjunctivae and EOM are normal.  Neck: Normal range of motion. Neck supple.  Cardiovascular: Normal rate and regular rhythm.   Pulmonary/Chest: Effort normal. No respiratory distress.  Abdominal: Soft. There is no tenderness.  Musculoskeletal: Normal range of motion. He exhibits no edema or tenderness.  Neurological: He is alert and oriented to person, place, and time.  Skin: Skin is warm and dry.  Wound, see pic below  Nursing note and vitals reviewed.      ED Course  Procedures (including critical care time) Labs Review Labs Reviewed - No data to display  Imaging Review No results found. I have personally reviewed and evaluated these images and lab results as part of my medical decision-making.   EKG Interpretation None      MDM   Final diagnoses:  Diabetic ulcer of right foot associated with other specified diabetes mellitus   Skin ulcer on right foot. Tunnels a bit but not to bone so I doubt osteomyelitis. No e/o infection (cellulitis or abscess). No e/o systemic toxicity. Will have him follow up in wound care clinic which i scheduled for June 22, 2015. Will return here for new/worsening symptoms.   I have personally and contemperaneously reviewed  labs and imaging and used in my decision making as above.   A medical screening exam was performed and I feel the patient has had an appropriate workup for their chief complaint at this time and likelihood of emergent condition existing is low. They have been counseled on decision, discharge, follow up and which symptoms necessitate immediate return to the emergency department. They or their family verbally stated understanding and agreement with plan and discharged in stable condition.      Marily Memos, MD 07/12/15 [phone]

## 2015-07-12 NOTE — ED Notes (Signed)
Pt has diabetic ulcer on right foot; painful and been monitor by [facility] MD. Recommended to go to foot center; large area under great toe on footbed; looks to be tunneled; draining pus and blood. Been ongoing for 3 mos.

## 2015-07-12 NOTE — ED Notes (Signed)
Pt here for eval of wound to toe. Pt reports he he has this for three months. Wound is tunneled with serosangious drainage.

## 2015-07-12 NOTE — Telephone Encounter (Addendum)
Pt states he was calling to see if he could get in today, he is really having a problem with his foot.  Referred to scheduler.  Education administrator reviewed this weeks office schedule, there are no walk-in slots available, with pt's health history we would need to refer pt to the ER, they would refer to the [facility].  Pt was given the instructions, and states understanding.

## 2015-07-19 ENCOUNTER — Ambulatory Visit: Payer: 59 | Admitting: Podiatry

## 2015-07-23 ENCOUNTER — Encounter (HOSPITAL_BASED_OUTPATIENT_CLINIC_OR_DEPARTMENT_OTHER): Payer: 59 | Attending: Internal Medicine

## 2015-07-23 DIAGNOSIS — E11621 Type 2 diabetes mellitus with foot ulcer: Secondary | ICD-10-CM | POA: Insufficient documentation

## 2015-07-23 DIAGNOSIS — L97411 Non-pressure chronic ulcer of right heel and midfoot limited to breakdown of skin: Secondary | ICD-10-CM | POA: Insufficient documentation

## 2015-07-23 DIAGNOSIS — I10 Essential (primary) hypertension: Secondary | ICD-10-CM | POA: Diagnosis not present

## 2015-07-23 DIAGNOSIS — G4730 Sleep apnea, unspecified: Secondary | ICD-10-CM | POA: Insufficient documentation

## 2015-07-23 DIAGNOSIS — I2510 Atherosclerotic heart disease of native coronary artery without angina pectoris: Secondary | ICD-10-CM | POA: Diagnosis not present

## 2015-07-23 DIAGNOSIS — Z794 Long term (current) use of insulin: Secondary | ICD-10-CM | POA: Insufficient documentation

## 2015-08-06 ENCOUNTER — Telehealth: Payer: Self-pay | Admitting: *Deleted

## 2015-08-06 DIAGNOSIS — I2510 Atherosclerotic heart disease of native coronary artery without angina pectoris: Secondary | ICD-10-CM | POA: Diagnosis not present

## 2015-08-06 DIAGNOSIS — L97411 Non-pressure chronic ulcer of right heel and midfoot limited to breakdown of skin: Secondary | ICD-10-CM | POA: Diagnosis not present

## 2015-08-06 DIAGNOSIS — E11621 Type 2 diabetes mellitus with foot ulcer: Secondary | ICD-10-CM | POA: Diagnosis not present

## 2015-08-06 DIAGNOSIS — I10 Essential (primary) hypertension: Secondary | ICD-10-CM | POA: Diagnosis not present

## 2015-08-06 NOTE — Telephone Encounter (Signed)
Needs to schedule appt

## 2015-08-06 NOTE — Telephone Encounter (Signed)
Pt states the Wound Care Center doctor states pt would benefit from diabetic shoes and referred to our office to begin process.

## 2015-08-13 DIAGNOSIS — I2510 Atherosclerotic heart disease of native coronary artery without angina pectoris: Secondary | ICD-10-CM | POA: Diagnosis not present

## 2015-08-13 DIAGNOSIS — I10 Essential (primary) hypertension: Secondary | ICD-10-CM | POA: Diagnosis not present

## 2015-08-13 DIAGNOSIS — L97411 Non-pressure chronic ulcer of right heel and midfoot limited to breakdown of skin: Secondary | ICD-10-CM | POA: Diagnosis not present

## 2015-08-13 DIAGNOSIS — E11621 Type 2 diabetes mellitus with foot ulcer: Secondary | ICD-10-CM | POA: Diagnosis not present

## 2015-08-16 ENCOUNTER — Ambulatory Visit (INDEPENDENT_AMBULATORY_CARE_PROVIDER_SITE_OTHER): Payer: 59 | Admitting: Podiatry

## 2015-08-16 ENCOUNTER — Encounter: Payer: Self-pay | Admitting: Podiatry

## 2015-08-16 VITALS — BP 108/74 | HR 79 | Resp 18

## 2015-08-16 DIAGNOSIS — M216X9 Other acquired deformities of unspecified foot: Secondary | ICD-10-CM | POA: Diagnosis not present

## 2015-08-16 DIAGNOSIS — M2010 Hallux valgus (acquired), unspecified foot: Secondary | ICD-10-CM

## 2015-08-16 DIAGNOSIS — L97511 Non-pressure chronic ulcer of other part of right foot limited to breakdown of skin: Secondary | ICD-10-CM

## 2015-08-16 DIAGNOSIS — E1140 Type 2 diabetes mellitus with diabetic neuropathy, unspecified: Secondary | ICD-10-CM

## 2015-08-16 DIAGNOSIS — L84 Corns and callosities: Secondary | ICD-10-CM | POA: Diagnosis not present

## 2015-08-16 DIAGNOSIS — M2040 Other hammer toe(s) (acquired), unspecified foot: Secondary | ICD-10-CM | POA: Diagnosis not present

## 2015-08-16 DIAGNOSIS — E1149 Type 2 diabetes mellitus with other diabetic neurological complication: Secondary | ICD-10-CM

## 2015-08-16 NOTE — Progress Notes (Signed)
Patient ID: [name] [name], male   DOB: Apr 30, 1956, 59 y.o.   MRN: [id number]  Subjective: 59 year old male presents the office today requesting diabetic shoes and for ulceration submetatarsal 1 bilaterally. He has been seen by the wound care center and they've been applying a dressing over the area. They've been offloading the area however this time he needs more definitive offloading with a diabetic insert. He denies any drainage from the wounds at this time he denies any surrounding redness or drainage. No swelling. No new open lesions or pre-ulcerative lesions. Denies any systemic complaints as fevers, chills, nausea, time. No calf pain, chest pain, soreness of breath.  Objective: AAO 3, NAD DP/PT pulses palpable, CRT less than 3 seconds Protective sensation decreased with Simms Weinstein monofilament Right foot submetatarsal one annular hyperkeratotic lesion. Upon debridement underlying skin appears to be intact at this time and the wound appears to be healed. There is no swelling erythema, ascending cellulitis, fluctuation, crepitus, malodor, drainage. Small pre-ulcerative lesion left foot submetatarsal one. There is plantar flexed first metatarsal bilaterally. HAV is present with hammertoe contractures bilaterally.  There is no pain with calf compression, swelling, warmth, erythema. No other open lesions or pre-ulcer lesions identified.  Assessment: 59 year old male with healing right the metatarsal 1 ulceration pre-ulcerative callus and foot deformity.  Plan: -Treatment options discussed including all alternatives, risks, and complications -Right foot hyperkeratotic lesion was sharply debrided to reveal the underlying skin is intact without ulceration. -At this time I do believe that he would likely benefit from a diabetic shoe and inserts to help offload the symptomatic areas and to help prevent further skin breakdown. At today's appointment paperwork is completed for precertification.  He was also measured for shoes today. -Continue wound care center follow-up -Follow-up when shoes/inserts arrive or sooner if any problems arise. In the meantime, encouraged to call the office with any questions, concerns, change in symptoms.   Ovid Curd, DPM

## 2015-09-03 ENCOUNTER — Telehealth: Payer: Self-pay | Admitting: *Deleted

## 2015-09-03 NOTE — Telephone Encounter (Signed)
Called patient letting him know we received a response from Dr. Azucena CecilSwayne in regards to his diabetic shoes, he has been denied at this time as Dr. Azucena CecilSwayne states he needs to be seen by an endocrinologist for this.  Patient states he has an up coming appointment in November, he will notify me when he has completed this appointment so we can forward the authorization request.

## 2015-10-06 LAB — HM DIABETES EYE EXAM

## 2015-12-02 ENCOUNTER — Ambulatory Visit (INDEPENDENT_AMBULATORY_CARE_PROVIDER_SITE_OTHER): Payer: 59 | Admitting: Podiatry

## 2015-12-02 DIAGNOSIS — M216X9 Other acquired deformities of unspecified foot: Secondary | ICD-10-CM

## 2015-12-02 DIAGNOSIS — M2040 Other hammer toe(s) (acquired), unspecified foot: Secondary | ICD-10-CM | POA: Diagnosis not present

## 2015-12-02 DIAGNOSIS — L84 Corns and callosities: Secondary | ICD-10-CM | POA: Diagnosis not present

## 2015-12-02 DIAGNOSIS — M2010 Hallux valgus (acquired), unspecified foot: Secondary | ICD-10-CM

## 2015-12-02 DIAGNOSIS — E1149 Type 2 diabetes mellitus with other diabetic neurological complication: Secondary | ICD-10-CM

## 2015-12-02 NOTE — Progress Notes (Signed)
Patient ID: [name] RodneyWilliam C [name], male   DOB: [birth year], 58 y.o.   MRN: [id number] Patient presents for diabetic shoe pick up, shoes are tried on for good fit.  Patient received 1 Pair Apex X532M Rhino Runner Blue in men's 13 wide and 3 pairs custom molded diabetic inserts.  Verbal and written break in and wear instructions given.  Patient will follow up for scheduled routine care. No acute changes since last appointment.

## 2015-12-02 NOTE — Patient Instructions (Signed)

## 2016-12-01 DIAGNOSIS — N183 Chronic kidney disease, stage 3 (moderate): Secondary | ICD-10-CM | POA: Diagnosis not present

## 2016-12-18 DIAGNOSIS — G4733 Obstructive sleep apnea (adult) (pediatric): Secondary | ICD-10-CM | POA: Diagnosis not present

## 2016-12-18 DIAGNOSIS — I10 Essential (primary) hypertension: Secondary | ICD-10-CM | POA: Diagnosis not present

## 2016-12-18 DIAGNOSIS — E1122 Type 2 diabetes mellitus with diabetic chronic kidney disease: Secondary | ICD-10-CM | POA: Diagnosis not present

## 2016-12-29 DIAGNOSIS — G4733 Obstructive sleep apnea (adult) (pediatric): Secondary | ICD-10-CM | POA: Diagnosis not present

## 2017-01-01 DIAGNOSIS — G4733 Obstructive sleep apnea (adult) (pediatric): Secondary | ICD-10-CM | POA: Diagnosis not present

## 2017-01-08 DIAGNOSIS — G4733 Obstructive sleep apnea (adult) (pediatric): Secondary | ICD-10-CM | POA: Diagnosis not present

## 2017-01-16 DIAGNOSIS — G4733 Obstructive sleep apnea (adult) (pediatric): Secondary | ICD-10-CM | POA: Diagnosis not present

## 2017-02-05 DIAGNOSIS — G4733 Obstructive sleep apnea (adult) (pediatric): Secondary | ICD-10-CM | POA: Diagnosis not present

## 2017-02-26 DIAGNOSIS — G4733 Obstructive sleep apnea (adult) (pediatric): Secondary | ICD-10-CM | POA: Diagnosis not present

## 2017-03-08 DIAGNOSIS — G4733 Obstructive sleep apnea (adult) (pediatric): Secondary | ICD-10-CM | POA: Diagnosis not present

## 2017-04-07 DIAGNOSIS — G4733 Obstructive sleep apnea (adult) (pediatric): Secondary | ICD-10-CM | POA: Diagnosis not present

## 2017-04-19 DIAGNOSIS — G4733 Obstructive sleep apnea (adult) (pediatric): Secondary | ICD-10-CM | POA: Diagnosis not present

## 2017-04-20 ENCOUNTER — Ambulatory Visit (INDEPENDENT_AMBULATORY_CARE_PROVIDER_SITE_OTHER): Payer: 59 | Admitting: Podiatry

## 2017-04-20 ENCOUNTER — Encounter: Payer: Self-pay | Admitting: Podiatry

## 2017-04-20 DIAGNOSIS — L84 Corns and callosities: Secondary | ICD-10-CM | POA: Diagnosis not present

## 2017-04-20 DIAGNOSIS — E1149 Type 2 diabetes mellitus with other diabetic neurological complication: Secondary | ICD-10-CM

## 2017-04-26 NOTE — Progress Notes (Signed)
Subjective: 61 y.o. male presents the office today requesting diabetic inserts for her shoes. He states his old ones are worn out and he needs new ones. He also states he has multiple calluses to both of his feet and healing Trimmed. Denies any drainage or pus and denies any swelling or redness he denies any open sores. Overall he states he is doing well. Denies any systemic complaints such as fevers, chills, nausea, vomiting.   Objective: AAO 3, NAD DP/PT pulses palpable, CRT less than 3 seconds Protective sensation decreased with Simms Weinstein monofilament Multiple hyperkeratotic lesions are present bilateral feet. Bilateral submetatarsal 1, bilateral medial hallux, submetatarsal lesions bilateral. Upon debridement there is no underlying ulceration, drainage or any signs of infection. There is no other open lesion identified at this time. No open lesions or pre-ulcerative lesions are identified. No other areas of tenderness bilateral lower extremities. No overlying edema, erythema, increased warmth. No pain with calf compression, swelling, warmth, erythema.  Assessment: Patient presents with pre-ulcerative calluses 6  Plan: -Treatment options including alternatives, risks, complications were discussed -Hyperkeratotic lesions were sharply debrided 6 without competitions or bleeding. -He was molded for diabetic inserts today. -Discussed daily foot inspection. If there are any changes, to call the office immediately.  -Follow-up in 3 months or sooner if any problems are to arise. In the meantime, encouraged to call the office with any questions, concerns, changes symptoms.  Ovid CurdMatthew Lewayne Pauley, DPM

## 2017-04-27 DIAGNOSIS — N183 Chronic kidney disease, stage 3 (moderate): Secondary | ICD-10-CM | POA: Diagnosis not present

## 2017-04-27 DIAGNOSIS — E1142 Type 2 diabetes mellitus with diabetic polyneuropathy: Secondary | ICD-10-CM | POA: Diagnosis not present

## 2017-04-27 DIAGNOSIS — E1122 Type 2 diabetes mellitus with diabetic chronic kidney disease: Secondary | ICD-10-CM | POA: Diagnosis not present

## 2017-05-08 DIAGNOSIS — G4733 Obstructive sleep apnea (adult) (pediatric): Secondary | ICD-10-CM | POA: Diagnosis not present

## 2017-06-05 ENCOUNTER — Telehealth: Payer: Self-pay | Admitting: Podiatry

## 2017-06-05 NOTE — Telephone Encounter (Signed)
Left message for patient. Diabetic inserts are in and he needs to schedule an appointment to pick up.

## 2017-06-07 DIAGNOSIS — G4733 Obstructive sleep apnea (adult) (pediatric): Secondary | ICD-10-CM | POA: Diagnosis not present

## 2017-06-11 ENCOUNTER — Telehealth: Payer: Self-pay | Admitting: Podiatry

## 2017-06-11 NOTE — Telephone Encounter (Signed)
Left message to call [facility]

## 2017-06-28 ENCOUNTER — Ambulatory Visit (INDEPENDENT_AMBULATORY_CARE_PROVIDER_SITE_OTHER): Payer: 59 | Admitting: Orthotics

## 2017-06-28 DIAGNOSIS — E1149 Type 2 diabetes mellitus with other diabetic neurological complication: Secondary | ICD-10-CM | POA: Diagnosis not present

## 2017-06-28 DIAGNOSIS — L84 Corns and callosities: Secondary | ICD-10-CM

## 2017-06-28 NOTE — Progress Notes (Signed)
Patient came in today to pick up custom made foot orthotics.  The goals were accomplished and the patient reported no dissatisfaction with said orthotics.  Patient was advised of breakin period and how to report any issues. 

## 2017-07-08 DIAGNOSIS — G4733 Obstructive sleep apnea (adult) (pediatric): Secondary | ICD-10-CM | POA: Diagnosis not present

## 2017-08-08 DIAGNOSIS — G4733 Obstructive sleep apnea (adult) (pediatric): Secondary | ICD-10-CM | POA: Diagnosis not present

## 2017-08-17 ENCOUNTER — Ambulatory Visit: Payer: 59 | Admitting: Podiatry

## 2017-08-17 ENCOUNTER — Encounter: Payer: 59 | Attending: Physician Assistant | Admitting: Physician Assistant

## 2017-08-17 DIAGNOSIS — L97512 Non-pressure chronic ulcer of other part of right foot with fat layer exposed: Secondary | ICD-10-CM | POA: Diagnosis not present

## 2017-08-17 DIAGNOSIS — E11621 Type 2 diabetes mellitus with foot ulcer: Secondary | ICD-10-CM | POA: Insufficient documentation

## 2017-08-17 DIAGNOSIS — I10 Essential (primary) hypertension: Secondary | ICD-10-CM | POA: Diagnosis not present

## 2017-08-17 DIAGNOSIS — F411 Generalized anxiety disorder: Secondary | ICD-10-CM | POA: Diagnosis not present

## 2017-08-17 DIAGNOSIS — Z794 Long term (current) use of insulin: Secondary | ICD-10-CM | POA: Diagnosis not present

## 2017-08-17 DIAGNOSIS — I2510 Atherosclerotic heart disease of native coronary artery without angina pectoris: Secondary | ICD-10-CM | POA: Insufficient documentation

## 2017-08-20 NOTE — Progress Notes (Signed)
[name] [name], [name] [name] ([id number]) Visit Report for 08/17/2017 Chief Complaint Document Details Patient Name: [name] [name], [name] [name]. Date of Service: 08/17/2017 10:30 AM Medical Record Number: [id number] Patient Account Number: [id number] Date of Birth/Sex: 02-28-1956 (61 y.o. Male) Treating RN: Phillis Haggis Primary Care Provider: Azucena Cecil, DAVID Other Clinician: Referring Provider: Ovid Curd Treating Provider/Extender: Linwood Dibbles, HOYT Weeks in Treatment: 0 Information Obtained from: Patient Chief Complaint Right Plantar 1st metatarsal Ulcer Electronic Signature(s) Signed: 08/17/2017 5:13:37 PM By: Lenda Kelp PA-C Entered By: Lenda Kelp on 08/17/2017 17:04:29 Leugers, [name] [name] ([id number]) -------------------------------------------------------------------------------- Debridement Details Patient Name: [name] [name]. Date of Service: 08/17/2017 10:30 AM Medical Record Number: [id number] Patient Account Number: [id number] Date of Birth/Sex: [birth year] (61 y.o. Male) Treating RN: Phillis Haggis Primary Care Provider: Azucena Cecil, DAVID Other Clinician: Referring Provider: Ovid Curd Treating Provider/Extender: Linwood Dibbles, HOYT Weeks in Treatment: 0 Debridement Performed for Wound #1 Right,Plantar Metatarsal head first Assessment: Performed By: Physician STONE III, HOYT E., PA-C Debridement: Debridement Severity of Tissue Pre Fat layer exposed Debridement: Pre-procedure Verification/Time Out Yes - 11:11 Taken: Start Time: 11:12 Pain Control: Lidocaine 4% Topical Solution Level: Skin/Subcutaneous Tissue Total Area Debrided (L x 3 (cm) x 2.3 (cm) = 6.9 (cm) W): Tissue and other Viable, Non-Viable, Exudate, Fibrin/Slough, Subcutaneous material debrided: Instrument: Forceps, Scissors Bleeding: Minimum Hemostasis Achieved: Pressure End Time: 11:15 Procedural Pain: 0 Post Procedural Pain: 0 Response to Treatment: Procedure was tolerated well Post Debridement  Measurements of Total Wound Length: (cm) 0.7 Width: (cm) 0.4 Depth: (cm) 0.2 Volume: (cm) 0.044 Character of Wound/Ulcer Post Requires Further Debridement Debridement: Severity of Tissue Post Debridement: Fat layer exposed Post Procedure Diagnosis Same as Pre-procedure Electronic Signature(s) Signed: 08/17/2017 5:13:37 PM By: Lenda Kelp PA-C Signed: 08/20/2017 9:42:14 AM By: Maggie Schwalbe ([id number]) Entered By: Alejandro Mulling on 08/17/2017 11:24:54 [name] [name] ([id number]) -------------------------------------------------------------------------------- HPI Details Patient Name: [name] [name]. Date of Service: 08/17/2017 10:30 AM Medical Record Number: [id number] Patient Account Number: [id number] Date of Birth/Sex: 1956/10/21 (61 y.o. Male) Treating RN: Phillis Haggis Primary Care Provider: Azucena Cecil, DAVID Other Clinician: Referring Provider: Ovid Curd Treating Provider/Extender: Linwood Dibbles, HOYT Weeks in Treatment: 0 History of Present Illness HPI Description: 08/17/17 on evaluation today patient presents for a right plantar first metatarsal head ulcer which he states he has dealt with intermittently for number of years. In fact he has custom-made shoe inserts which he recently about a month ago got remade for him because he knew he was going on a long trip of 10 days were he would be doing a lot of walking. This trip was to [place] with his wife. With that being said he tells me that he did fine during the trip and it was about three days afterwards that he noticed fluid ceiling underneath the callous area and his wife was subsequently able to get out a significant amount of that she tells me "did not smell very good". Fortunately he is having no discomfort. No fevers, chills, nausea, or vomiting noted at this time. Of note patient did take some amoxicillin that his wife had previously prescribed for her that she never used which does seem to  have helped some with the redness and he also tells me that he has been using Silvadene on the area. He does have diabetes and his last A1c was in the 7os range although he is due to have this repeated in the next month. Otherwise patient is treated for hypertension, anxiety, and his diabetes. Electronic  Signature(s) Signed: 08/17/2017 5:13:37 PM By: Lenda Kelp PA-C Entered By: Lenda Kelp on 08/17/2017 17:06:09 Morrill, [name] [name] ([id number]) -------------------------------------------------------------------------------- Physical Exam Details Patient Name: [name] [name], [name] [name]. Date of Service: 08/17/2017 10:30 AM Medical Record Number: [id number] Patient Account Number: [id number] Date of Birth/Sex: [birth year] (61 y.o. Male) Treating RN: Phillis Haggis Primary Care Provider: Azucena Cecil, DAVID Other Clinician: Referring Provider: Ovid Curd Treating Provider/Extender: STONE III, HOYT Weeks in Treatment: 0 Constitutional patient is hypertensive.. pulse regular and within target range for patient.Marland Kitchen respirations regular, non-labored and within target range for patient.Marland Kitchen temperature within target range for patient.. Well-nourished and well- hydrated in no acute distress. Eyes conjunctiva clear no eyelid edema noted. pupils equal round and reactive to light and accommodation. Ears, Nose, Mouth, and Throat no gross abnormality of ear auricles or external auditory canals. normal hearing noted during conversation. mucus membranes moist. Respiratory normal breathing without difficulty. clear to auscultation bilaterally. Cardiovascular regular rate and rhythm with normal S1, S2. 2+ dorsalis pedis/posterior tibialis pulses. no clubbing, cyanosis, significant edema, <3 sec cap refill. Gastrointestinal (GI) soft, non-tender, non-distended, +BS. no ventral hernia noted. Musculoskeletal normal gait and posture. no significant deformity or arthritic changes, no loss or range of motion,  no clubbing. Psychiatric this patient is able to make decisions and demonstrates good insight into disease process. Alert and Oriented x 3. pleasant and cooperative. Notes Patient has a significant amount of callous buildup noted on the plantar surface of his right foot in the first metatarsal region. There is an opening centrally and what appears to be a blister around the base of the callus. I discussed with patient that this needed debridement and he agreed as well as his wife agreed. Subsequently debridement was performed which he tolerated very well with good success. The wound was actually significantly smaller and looked much better post debridement than what was originally thought upon initial inspection. Electronic Signature(s) Signed: 08/17/2017 5:13:37 PM By: Lenda Kelp PA-C Entered By: Lenda Kelp on 08/17/2017 17:09:48 [name] [name], [name] [name] ([id number]) -------------------------------------------------------------------------------- Physician Orders Details Patient Name: [name] [name], [name] [name]. Date of Service: 08/17/2017 10:30 AM Medical Record Number: [id number] Patient Account Number: [id number] Date of Birth/Sex: May 23, 1956 (60 y.o. Male) Treating RN: Phillis Haggis Primary Care Provider: Azucena Cecil, DAVID Other Clinician: Referring Provider: Ovid Curd Treating Provider/Extender: Linwood Dibbles, HOYT Weeks in Treatment: 0 Verbal / Phone Orders: Yes ClinicianAshok Cordia, Debi Read Back and Verified: Yes Diagnosis Coding Wound Cleansing Wound #1 Right,Plantar Metatarsal head first o Clean wound with Normal Saline. o Cleanse wound with mild soap and water o May Shower, gently pat wound dry prior to applying new dressing. Anesthetic Wound #1 Right,Plantar Metatarsal head first o Topical Lidocaine 4% cream applied to wound bed prior to debridement Primary Wound Dressing Wound #1 Right,Plantar Metatarsal head first o Aquacel Ag Secondary Dressing Wound #1  Right,Plantar Metatarsal head first o Dry Gauze o Conform/Kerlix o Other - felt for offloading Dressing Change Frequency Wound #1 Right,Plantar Metatarsal head first o Change dressing every other day. Follow-up Appointments Wound #1 Right,Plantar Metatarsal head first o Return Appointment in 2 weeks. Edema Control Wound #1 Right,Plantar Metatarsal head first o Elevate legs to the level of the heart and pump ankles as often as possible Off-Loading Wound #1 Right,Plantar Metatarsal head first o Other: - wear offloading shoe Fritts, [name] [name] ([id number]) Additional Orders / Instructions Wound #1 Right,Plantar Metatarsal head first o Increase protein intake. Electronic Signature(s) Signed: 08/17/2017 5:13:37 PM By: Lenda Kelp PA-C Signed: 08/20/2017 9:42:14  AM By: Alejandro Mulling Entered By: Alejandro Mulling on 08/17/2017 11:34:03 Stantz, [name] [name] ([id number]) -------------------------------------------------------------------------------- Problem List Details Patient Name: BRODERICK, FONSECA. Date of Service: 08/17/2017 10:30 AM Medical Record Number: [id number] Patient Account Number: [id number] Date of Birth/Sex: Apr 10, 1956 (61 y.o. Male) Treating RN: Phillis Haggis Primary Care Provider: Azucena Cecil, DAVID Other Clinician: Referring Provider: Ovid Curd Treating Provider/Extender: Linwood Dibbles, HOYT Weeks in Treatment: 0 Active Problems ICD-10 Encounter Code Description Active Date Diagnosis E11.621 Type 2 diabetes mellitus with foot ulcer 08/17/2017 Yes L97.512 Non-pressure chronic ulcer of other part of right foot with 08/17/2017 Yes fat layer exposed I10 Essential (primary) hypertension 08/17/2017 Yes F41.1 Generalized anxiety disorder 08/17/2017 Yes Inactive Problems Resolved Problems Electronic Signature(s) Signed: 08/17/2017 5:13:37 PM By: Lenda Kelp PA-C Entered By: Lenda Kelp on 08/17/2017 17:03:21 [name] [name], [name] [name]  ([id number]) -------------------------------------------------------------------------------- Progress Note Details Patient Name: [name] [name]. Date of Service: 08/17/2017 10:30 AM Medical Record Number: [id number] Patient Account Number: [id number] Date of Birth/Sex: 04-11-1956 (61 y.o. Male) Treating RN: Phillis Haggis Primary Care Provider: Azucena Cecil, DAVID Other Clinician: Referring Provider: Ovid Curd Treating Provider/Extender: Linwood Dibbles, HOYT Weeks in Treatment: 0 Subjective Chief Complaint Information obtained from Patient Right Plantar 1st metatarsal Ulcer History of Present Illness (HPI) 08/17/17 on evaluation today patient presents for a right plantar first metatarsal head ulcer which he states he has dealt with intermittently for number of years. In fact he has custom-made shoe inserts which he recently about a month ago got remade for him because he knew he was going on a long trip of 10 days were he would be doing a lot of walking. This trip was to [place] with his wife. With that being said he tells me that he did fine during the trip and it was about three days afterwards that he noticed fluid ceiling underneath the callous area and his wife was subsequently able to get out a significant amount of that she tells me "did not smell very good". Fortunately he is having no discomfort. No fevers, chills, nausea, or vomiting noted at this time. Of note patient did take some amoxicillin that his wife had previously prescribed for her that she never used which does seem to have helped some with the redness and he also tells me that he has been using Silvadene on the area. He does have diabetes and his last A1c was in the 7 s range although he is due to have this repeated in the next month. Otherwise patient is treated for hypertension, anxiety, and his diabetes. Wound History Patient presents with 1 open wound that has been present for approximately 08/10/2017. Patient has  been treating wound in the following manner: silvadene. The wound has been healed in the past but has re- opened. Laboratory tests have not been performed in the last month. Patient reportedly has not tested positive for an antibiotic resistant organism. Patient reportedly has not tested positive for osteomyelitis. Patient reportedly has not had testing performed to evaluate circulation in the legs. Patient History Information obtained from Patient. Allergies NKDA Family History Cancer - Mother, Heart Disease - Maternal Grandparents, Hypertension - Father,Paternal Grandparents, No family history of Diabetes, Hereditary Spherocytosis, Kidney Disease, Lung Disease, Seizures, Stroke, Thyroid Problems, Tuberculosis. [name] [name], [name] [name] ([id number]) Social History Never smoker, Marital Status - Married, Alcohol Use - Rarely, Drug Use - No History, Caffeine Use - Daily. Medical History Cardiovascular Patient has history of Coronary Artery Disease, Hypertension Endocrine Patient has history of Type II Diabetes Patient is  treated with Insulin. Blood sugar is not tested. Review of Systems (ROS) Constitutional Symptoms (General Health) The patient has no complaints or symptoms. Eyes Complains or has symptoms of Glasses / Contacts. Ear/Nose/Mouth/Throat The patient has no complaints or symptoms. Hematologic/Lymphatic The patient has no complaints or symptoms. Respiratory The patient has no complaints or symptoms. Cardiovascular open heart surgery hyperlipidemia Gastrointestinal The patient has no complaints or symptoms. Genitourinary The patient has no complaints or symptoms. Integumentary (Skin) Complains or has symptoms of Wounds. Musculoskeletal Denies complaints or symptoms of Muscle Pain. Neurologic The patient has no complaints or symptoms. Oncologic The patient has no complaints or symptoms. Psychiatric The patient has no complaints or symptoms. Objective [name] [name], [name] [name]  ([id number]) Constitutional patient is hypertensive.. pulse regular and within target range for patient.Marland Kitchen respirations regular, non-labored and within target range for patient.Marland Kitchen temperature within target range for patient.. Well-nourished and well- hydrated in no acute distress. Vitals Time Taken: 10:32 AM, Height: 72 in, Source: Stated, Weight: 238.9 lbs, Source: Measured, BMI: 32.4, Temperature: 97.9 F, Pulse: 75 bpm, Respiratory Rate: 18 breaths/min, Blood Pressure: 154/86 mmHg. Eyes conjunctiva clear no eyelid edema noted. pupils equal round and reactive to light and accommodation. Ears, Nose, Mouth, and Throat no gross abnormality of ear auricles or external auditory canals. normal hearing noted during conversation. mucus membranes moist. Respiratory normal breathing without difficulty. clear to auscultation bilaterally. Cardiovascular regular rate and rhythm with normal S1, S2. 2+ dorsalis pedis/posterior tibialis pulses. no clubbing, cyanosis, significant edema, Gastrointestinal (GI) soft, non-tender, non-distended, +BS. no ventral hernia noted. Musculoskeletal normal gait and posture. no significant deformity or arthritic changes, no loss or range of motion, no clubbing. Psychiatric this patient is able to make decisions and demonstrates good insight into disease process. Alert and Oriented x 3. pleasant and cooperative. General Notes: Patient has a significant amount of callous buildup noted on the plantar surface of his right foot in the first metatarsal region. There is an opening centrally and what appears to be a blister around the base of the callus. I discussed with patient that this needed debridement and he agreed as well as his wife agreed. Subsequently debridement was performed which he tolerated very well with good success. The wound was actually significantly smaller and looked much better post debridement than what was originally thought upon initial  inspection. Integumentary (Hair, Skin) Wound #1 status is Open. Original cause of wound was Gradually Appeared. The wound is located on the Right,Plantar Metatarsal head first. The wound measures 3cm length x 2.3cm width x 0.1cm depth; 5.419cm^2 area and 0.542cm^3 volume. There is no tunneling noted, however, there is undermining starting at 12:00 and ending at 12:00 with a maximum distance of 1.6cm. There is a large amount of serous drainage noted. The wound margin is distinct with the outline attached to the wound base. There is no granulation within the wound bed. There is a large (67-100%) amount of necrotic tissue within the wound bed including Eschar and Adherent Slough. The periwound skin appearance exhibited: Callus, Scarring, [name] [name], [name] [name] ([id number]) Maceration, Ecchymosis. Periwound temperature was noted as No Abnormality. The periwound has tenderness on palpation. Assessment Active Problems ICD-10 E11.621 - Type 2 diabetes mellitus with foot ulcer L97.512 - Non-pressure chronic ulcer of other part of right foot with fat layer exposed I10 - Essential (primary) hypertension F41.1 - Generalized anxiety disorder Procedures Wound #1 Pre-procedure diagnosis of Wound #1 is a Diabetic Wound/Ulcer of the Lower Extremity located on the Right,Plantar Metatarsal head first .Severity of  Tissue Pre Debridement is: Fat layer exposed. There was a Skin/Subcutaneous Tissue Debridement ([id number]) debridement with total area of 6.9 sq cm performed by STONE III, HOYT E., PA-C. with the following instrument(s): Forceps and Scissors to remove Viable and Non-Viable tissue/material including Exudate, Fibrin/Slough, and Subcutaneous after achieving pain control using Lidocaine 4% Topical Solution. A time out was conducted at 11:11, prior to the start of the procedure. A Minimum amount of bleeding was controlled with Pressure. The procedure was tolerated well with a pain level of 0 throughout and  a pain level of 0 following the procedure. Post Debridement Measurements: 0.7cm length x 0.4cm width x 0.2cm depth; 0.044cm^3 volume. Character of Wound/Ulcer Post Debridement requires further debridement. Severity of Tissue Post Debridement is: Fat layer exposed. Post procedure Diagnosis Wound #1: Same as Pre-Procedure Plan Wound Cleansing: Wound #1 Right,Plantar Metatarsal head first: Clean wound with Normal Saline. Cleanse wound with mild soap and water May Shower, gently pat wound dry prior to applying new dressing. [name] [name], [name] [name] ([id number]) Anesthetic: Wound #1 Right,Plantar Metatarsal head first: Topical Lidocaine 4% cream applied to wound bed prior to debridement Primary Wound Dressing: Wound #1 Right,Plantar Metatarsal head first: Aquacel Ag Secondary Dressing: Wound #1 Right,Plantar Metatarsal head first: Dry Gauze Conform/Kerlix Other - felt for offloading Dressing Change Frequency: Wound #1 Right,Plantar Metatarsal head first: Change dressing every other day. Follow-up Appointments: Wound #1 Right,Plantar Metatarsal head first: Return Appointment in 2 weeks. Edema Control: Wound #1 Right,Plantar Metatarsal head first: Elevate legs to the level of the heart and pump ankles as often as possible Off-Loading: Wound #1 Right,Plantar Metatarsal head first: Other: - wear offloading shoe Additional Orders / Instructions: Wound #1 Right,Plantar Metatarsal head first: Increase protein intake. I'm going to recommend that we initiate the above wound care orders for the next two weeks we will see him for reevaluation following. If anything worsens significantly in the interim patient will contact our office for additional recommendations. Please see above for specific wound care orders. Electronic Signature(s) Signed: 08/17/2017 5:13:37 PM By: Lenda Kelp PA-C Entered By: Lenda Kelp on 08/17/2017 17:10:40 [name] [name], [name] [name]  ([id number]) -------------------------------------------------------------------------------- ROS/PFSH Details Patient Name: [name] [name] Date of Service: 08/17/2017 10:30 AM Medical Record Number: [id number] Patient Account Number: [id number] Date of Birth/Sex: 1956-10-22 (61 y.o. Male) Treating RN: Phillis Haggis Primary Care Provider: Azucena Cecil, DAVID Other Clinician: Referring Provider: Ovid Curd Treating Provider/Extender: Linwood Dibbles, HOYT Weeks in Treatment: 0 Information Obtained From Patient Wound History Do you currently have one or more open woundso Yes How many open wounds do you currently haveo 1 Approximately how long have you had your woundso 08/10/2017 How have you been treating your wound(s) until nowo silvadene Has your wound(s) ever healed and then re-openedo Yes Have you had any lab work done in the past montho No Have you tested positive for an antibiotic resistant organism (MRSA, VRE)o No Have you tested positive for osteomyelitis (bone infection)o No Have you had any tests for circulation on your legso No Eyes Complaints and Symptoms: Positive for: Glasses / Contacts Integumentary (Skin) Complaints and Symptoms: Positive for: Wounds Musculoskeletal Complaints and Symptoms: Negative for: Muscle Pain Constitutional Symptoms (General Health) Complaints and Symptoms: No Complaints or Symptoms Ear/Nose/Mouth/Throat Complaints and Symptoms: No Complaints or Symptoms Hematologic/Lymphatic Complaints and Symptoms: No Complaints or Symptoms BRAXTYN, BOJARSKI ([id number]) Respiratory Complaints and Symptoms: No Complaints or Symptoms Cardiovascular Complaints and Symptoms: Review of System Notes: open heart surgery hyperlipidemia Medical History: Positive for: Coronary Artery Disease; Hypertension  Gastrointestinal Complaints and Symptoms: No Complaints or Symptoms Endocrine Medical History: Positive for: Type II Diabetes Time with diabetes: 5  yrs Treated with: Insulin Blood sugar tested every day: No Genitourinary Complaints and Symptoms: No Complaints or Symptoms Neurologic Complaints and Symptoms: No Complaints or Symptoms Oncologic Complaints and Symptoms: No Complaints or Symptoms Psychiatric Complaints and Symptoms: No Complaints or Symptoms Immunizations Pneumococcal Vaccine: Received Pneumococcal Vaccination: No TREMON, SAINVIL ([id number]) Implantable Devices Family and Social History Cancer: Yes - Mother; Diabetes: No; Heart Disease: Yes - Maternal Grandparents; Hereditary Spherocytosis: No; Hypertension: Yes - Father,Paternal Grandparents; Kidney Disease: No; Lung Disease: No; Seizures: No; Stroke: No; Thyroid Problems: No; Tuberculosis: No; Never smoker; Marital Status - Married; Alcohol Use: Rarely; Drug Use: No History; Caffeine Use: Daily; Financial Concerns: No; Food, Clothing or Shelter Needs: No; Support System Lacking: No; Transportation Concerns: No; Advanced Directives: No; Patient does not want information on Advanced Directives; Do not resuscitate: No; Living Will: Yes (Not Provided); Medical Power of Attorney: No Electronic Signature(s) Signed: 08/17/2017 5:13:37 PM By: Lenda Kelp PA-C Signed: 08/20/2017 9:42:14 AM By: Alejandro Mulling Entered By: Alejandro Mulling on 08/17/2017 10:41:19 [place], [name] [name] ([id number]) -------------------------------------------------------------------------------- SuperBill Details Patient Name: [name] [name] Date of Service: 08/17/2017 Medical Record Number: [id number] Patient Account Number: [id number] Date of Birth/Sex: 1956/07/18 (61 y.o. Male) Treating RN: Phillis Haggis Primary Care Provider: Azucena Cecil, DAVID Other Clinician: Referring Provider: Ovid Curd Treating Provider/Extender: Linwood Dibbles, HOYT Weeks in Treatment: 0 Diagnosis Coding ICD-10 Codes Code Description E11.621 Type 2 diabetes mellitus with foot ulcer L97.512 Non-pressure  chronic ulcer of other part of right foot with fat layer exposed I10 Essential (primary) hypertension F41.1 Generalized anxiety disorder Facility Procedures CPT4 Code Description: [id number] 99213 - WOUND CARE VISIT-LEV 3 EST PT Modifier: Quantity: 1 CPT4 Code Description: [id number] 11042 - DEB SUBQ TISSUE 20 SQ CM/< ICD-10 Description Diagnosis L97.512 Non-pressure chronic ulcer of other part of right foo Modifier: t with fat la Quantity: 1 yer exposed Physician Procedures CPT4 Code Description: [id number] 02725 - WC PHYS LEVEL 4 - NEW PT ICD-10 Description Diagnosis E11.621 Type 2 diabetes mellitus with foot ulcer L97.512 Non-pressure chronic ulcer of other part of right fo I10 Essential (primary) hypertension F41.1  Generalized anxiety disorder Modifier: 25 ot with fat lay Quantity: 1 er exposed Electronic Signature(s) Signed: 08/17/2017 5:13:37 PM By: Lenda Kelp PA-C Entered By: Lenda Kelp on 08/17/2017 17:11:27

## 2017-08-20 NOTE — Progress Notes (Addendum)
[name] [name], [name] [name] ([id number]) Visit Report for 08/17/2017 Allergy List Details Patient Name: [name] [name], [name] [name]. Date of Service: 08/17/2017 10:30 AM Medical Record Number: [id number] Patient Account Number: [id number] Date of Birth/Sex: 30-Nov-1955 (61 y.o. Male) Treating RN: Phillis Haggis Primary Care Chloeann Alfred: Azucena Cecil, DAVID Other Clinician: Referring Trino Higinbotham: Ovid Curd Treating Elliana Bal/Extender: STONE III, HOYT Weeks in Treatment: 0 Allergies Active Allergies NKDA Allergy Notes Electronic Signature(s) Signed: 08/20/2017 9:42:14 AM By: Alejandro Mulling Entered By: Alejandro Mulling on 08/17/2017 10:34:26 Horsfall, [name] [name] ([id number]) -------------------------------------------------------------------------------- Arrival Information Details Patient Name: [name] [name]. Date of Service: 08/17/2017 10:30 AM Medical Record Number: [id number] Patient Account Number: [id number] Date of Birth/Sex: Aug 19, 1956 (61 y.o. Male) Treating RN: Phillis Haggis Primary Care Indie Boehne: Azucena Cecil, DAVID Other Clinician: Referring Macgregor Aeschliman: Ovid Curd Treating Andruw Battie/Extender: Linwood Dibbles, HOYT Weeks in Treatment: 0 Visit Information Patient Arrived: Ambulatory Arrival Time: 10:29 Accompanied By: wife Transfer Assistance: None Patient Identification Verified: Yes Secondary Verification Process Completed: Yes Patient Requires Transmission-Based No Precautions: Patient Has Alerts: Yes Patient Alerts: DM II Electronic Signature(s) Signed: 08/20/2017 9:42:14 AM By: Alejandro Mulling Entered By: Alejandro Mulling on 08/17/2017 10:32:36 [name] [name], [name] [name] ([id number]) -------------------------------------------------------------------------------- Clinic Level of Care Assessment Details Patient Name: [name] [name]. Date of Service: 08/17/2017 10:30 AM Medical Record Number: [id number] Patient Account Number: [id number] Date of Birth/Sex: 24-Nov-1955 (61 y.o. Male) Treating RN: Phillis Haggis Primary Care Aydia Maj: Azucena Cecil, DAVID Other Clinician: Referring Kaisey Huseby: Ovid Curd Treating Jakeia Carreras/Extender: Linwood Dibbles, HOYT Weeks in Treatment: 0 Clinic Level of Care Assessment Items TOOL 1 Quantity Score X - Use when EandM and Procedure is performed on INITIAL visit 1 0 ASSESSMENTS - Nursing Assessment / Reassessment X - General Physical Exam (combine w/ comprehensive assessment (listed just below) when 1 20 performed on new pt. evals) X- 1 25 Comprehensive Assessment (HX, ROS, Risk Assessments, Wounds Hx, etc.) ASSESSMENTS - Wound and Skin Assessment / Reassessment  - Dermatologic / Skin Assessment (not related to wound area) 0 ASSESSMENTS - Ostomy and/or Continence Assessment and Care  - Incontinence Assessment and Management 0  - 0 Ostomy Care Assessment and Management (repouching, etc.) PROCESS - Coordination of Care X - Simple Patient / Family Education for ongoing care 1 15  - 0 Complex (extensive) Patient / Family Education for ongoing care  - 0 Staff obtains Chiropractor, Records, Test Results / Process Orders  - 0 Staff telephones HHA, Nursing Homes / Clarify orders / etc  - 0 Routine Transfer to another Facility (non-emergent condition)  - 0 Routine Hospital Admission (non-emergent condition) X- 1 15 New Admissions / Manufacturing engineer / Ordering NPWT, Apligraf, etc.  - 0 Emergency Hospital Admission (emergent condition) PROCESS - Special Needs  - Pediatric / Minor Patient Management 0  - 0 Isolation Patient Management  - 0 Hearing / Language / Visual special needs  - 0 Assessment of Community assistance (transportation, D/C planning, etc.)  - 0 Additional assistance / Altered mentation  - 0 Support Surface(s) Assessment (bed, cushion, seat, etc.) ZARION, OLIFF ([id number]) INTERVENTIONS - Miscellaneous  - External ear exam 0  - 0 Patient Transfer (multiple staff / Nurse, adult / Similar devices)  -  0 Simple Staple / Suture removal (25 or less)  - 0 Complex Staple / Suture removal (26 or more)  - 0 Hypo/Hyperglycemic Management (do not check if billed separately) X- 1 15 Ankle / Brachial Index (ABI) - do not check if billed separately Has the patient been seen at the hospital within the last three years:  Yes Total Score: 90 Level Of Care: New/Established - Level 3 Electronic Signature(s) Signed: 08/20/2017 9:42:14 AM By: Alejandro Mulling Entered By: Alejandro Mulling on 08/17/2017 14:34:09 [name] [name], [name] [name] ([id number]) -------------------------------------------------------------------------------- Encounter Discharge Information Details Patient Name: [name] [name]. Date of Service: 08/17/2017 10:30 AM Medical Record Number: [id number] Patient Account Number: [id number] Date of Birth/Sex: 03-15-1956 (61 y.o. Male) Treating RN: Phillis Haggis Primary Care Taylee Gunnells: Azucena Cecil, DAVID Other Clinician: Referring Taziyah Iannuzzi: Ovid Curd Treating Corneluis Allston/Extender: Linwood Dibbles, HOYT Weeks in Treatment: 0 Encounter Discharge Information Items Discharge Pain Level: 0 Discharge Condition: Stable Ambulatory Status: Ambulatory Discharge Destination: Home Transportation: Private Auto Accompanied By: wife Schedule Follow-up Appointment: Yes Medication Reconciliation completed and No provided to Patient/Care Merit Maybee: Provided on Clinical Summary of Care: 08/17/2017 Form Type Recipient Paper Patient WR Electronic Signature(s) Signed: 08/20/2017 9:00:05 AM By: Gwenlyn Perking Entered By: Gwenlyn Perking on 08/17/2017 11:34:18 [name] [name], [name] [name] ([id number]) -------------------------------------------------------------------------------- Lower Extremity Assessment Details Patient Name: [name] [name]. Date of Service: 08/17/2017 10:30 AM Medical Record Number: [id number] Patient Account Number: [id number] Date of Birth/Sex: Mar 25, 1956 (61 y.o. Male) Treating RN: Phillis Haggis Primary  Care Jury Caserta: Azucena Cecil, DAVID Other Clinician: Referring Amauris Debois: Ovid Curd Treating Abdon Petrosky/Extender: Linwood Dibbles, HOYT Weeks in Treatment: 0 Edema Assessment Assessed: [Left: No] [Right: No] Edema: [Left: N] [Right: o] Vascular Assessment Pulses: Dorsalis Pedis Palpable: [Right:Yes] Doppler Audible: [Right:Yes] Posterior Tibial Palpable: [Right:Yes] Doppler Audible: [Right:Yes] Extremity colors, hair growth, and conditions: Extremity Color: [Right:Normal] Temperature of Extremity: [Right:Warm] Capillary Refill: [Right:> 3 seconds] Blood Pressure: Brachial: [Right:150] Dorsalis Pedis: [Left:Dorsalis Pedis: 180] Ankle: Posterior Tibial: [Left:Posterior Tibial: 168] [Right:1.20] Toe Nail Assessment Left: Right: Thick: No Discolored: Yes Deformed: No Improper Length and Hygiene: No Electronic Signature(s) Signed: 08/20/2017 9:42:14 AM By: Alejandro Mulling Entered By: Alejandro Mulling on 08/17/2017 10:51:17 [name] [name], [name] [name] ([id number]) -------------------------------------------------------------------------------- Multi Wound Chart Details Patient Name: [name] [name]. Date of Service: 08/17/2017 10:30 AM Medical Record Number: [id number] Patient Account Number: [id number] Date of Birth/Sex: December 24, 1955 (61 y.o. Male) Treating RN: Phillis Haggis Primary Care Riven Mabile: Azucena Cecil, DAVID Other Clinician: Referring Dre Gamino: Ovid Curd Treating Sharonlee Nine/Extender: Linwood Dibbles, HOYT Weeks in Treatment: 0 Vital Signs Height(in): 72 Pulse(bpm): 75 Weight(lbs): 238.9 Blood Pressure(mmHg): 154/86 Body Mass Index(BMI): 32 Temperature(F): 97.9 Respiratory Rate 18 (breaths/min): Photos: [1:No Photos] [N/A:N/A] Wound Location: [1:Right Metatarsal head first - N/A Plantar] Wounding Event: [1:Gradually Appeared] [N/A:N/A] Primary Etiology: [1:Diabetic Wound/Ulcer of the N/A Lower Extremity] Comorbid History: [1:Coronary Artery Disease, Hypertension, Type II Diabetes]  [N/A:N/A] Date Acquired: [1:08/10/2017] [N/A:N/A] Weeks of Treatment: [1:0] [N/A:N/A] Wound Status: [1:Open] [N/A:N/A] Measurements L x W x D [1:0.5x0.5x0.1] [N/A:N/A] (cm) Area (cm) : [1:0.196] [N/A:N/A] Volume (cm) : [1:0.02] [N/A:N/A] % Reduction in Area: [1:0.00%] [N/A:N/A] % Reduction in Volume: [1:0.00%] [N/A:N/A] Starting Position 1 [1:12] (o'clock): Ending Position 1 [1:12] (o'clock): Maximum Distance 1 (cm): [1:1.6] Undermining: [1:Yes] [N/A:N/A] Classification: [1:Grade 1] [N/A:N/A] Exudate Amount: [1:Large] [N/A:N/A] Exudate Type: [1:Serous] [N/A:N/A] Exudate Color: [1:amber] [N/A:N/A] Wound Margin: [1:Distinct, outline attached] [N/A:N/A] Granulation Amount: [1:None Present (0%)] [N/A:N/A] Necrotic Amount: [1:Large (67-100%)] [N/A:N/A] Necrotic Tissue: [1:Eschar, Adherent Slough] [N/A:N/A] Epithelialization: [1:Large (67-100%)] [N/A:N/A] Periwound Skin Texture: [1:Callus: Yes Scarring: Yes] [N/A:N/A] Periwound Skin Moisture: [1:Maceration: Yes] [N/A:N/A] Periwound Skin Color: [1:Ecchymosis: Yes] [N/A:N/A] Temperature: [1:No Abnormality] [N/A:N/A] Tenderness on Palpation: Yes N/A N/A Wound Preparation: Ulcer Cleansing: N/A N/A Rinsed/Irrigated with Saline Topical Anesthetic Applied: Other: lidocaine 4% Treatment Notes Electronic Signature(s) Signed: 08/20/2017 9:42:14 AM By: Alejandro Mulling Entered By: Alejandro Mulling on 08/17/2017 11:06:40 [name] [name], [name] [name] ([id number]) -------------------------------------------------------------------------------- Multi-Disciplinary Care Plan  Details Patient Name: [name] [name], [name] [name]. Date of Service: 08/17/2017 10:30 AM Medical Record Number: [id number] Patient Account Number: [id number] Date of Birth/Sex: [birth year] (61 y.o. Male) Treating RN: Phillis Haggis Primary Care Atwood Adcock: Azucena Cecil, DAVID Other Clinician: Referring Cloma Rahrig: Ovid Curd Treating Jhoan Schmieder/Extender: Linwood Dibbles, HOYT Weeks in Treatment:  0 Active Inactive Electronic Signature(s) Signed: 10/17/2017 10:01:15 AM By: Alejandro Mulling Previous Signature: 10/09/2017 2:04:17 PM Version By: Elliot Gurney BSN, RN, CWS, Kim RN, BSN Previous Signature: 08/20/2017 9:42:14 AM Version By: Alejandro Mulling Entered By: Alejandro Mulling on 10/17/2017 10:01:13 [name] [name] ([id number]) -------------------------------------------------------------------------------- Pain Assessment Details Patient Name: [name] [name], [name] [name]. Date of Service: 08/17/2017 10:30 AM Medical Record Number: [id number] Patient Account Number: [id number] Date of Birth/Sex: [birth year] (61 y.o. Male) Treating RN: Phillis Haggis Primary Care Dakoda Bassette: Azucena Cecil, DAVID Other Clinician: Referring Bernabe Dorce: Ovid Curd Treating Kamaree Wheatley/Extender: Linwood Dibbles, HOYT Weeks in Treatment: 0 Active Problems Location of Pain Severity and Description of Pain Patient Has Paino No Site Locations Pain Management and Medication Current Pain Management: Electronic Signature(s) Signed: 08/20/2017 9:42:14 AM By: Alejandro Mulling Entered By: Alejandro Mulling on 08/17/2017 10:32:43 [name] [name], [name] [name] ([id number]) -------------------------------------------------------------------------------- Patient/Caregiver Education Details Patient Name: [name] [name]. Date of Service: 08/17/2017 10:30 AM Medical Record Number: [id number] Patient Account Number: [id number] Date of Birth/Gender: 20-Dec-1955 (61 y.o. Male) Treating RN: Phillis Haggis Primary Care Physician: Azucena Cecil, DAVID Other Clinician: Referring Physician: Ovid Curd Treating Physician/Extender: Skeet Simmer in Treatment: 0 Education Assessment Education Provided To: Patient Education Topics Provided Elevated Blood Sugar/ Impact on Healing: Handouts: Elevated Blood Sugars: How Do They Affect Wound Healing Methods: Explain/Verbal Responses: State content correctly Nutrition: Handouts: Elevated Blood Sugars: How Do  They Affect Wound Healing Methods: Explain/Verbal Responses: State content correctly [facility]: Handouts: [facility] Methods: Explain/Verbal Responses: State content correctly Wound/Skin Impairment: Handouts: Other: change dressing as ordered Methods: Demonstration, Explain/Verbal Responses: State content correctly Electronic Signature(s) Signed: 08/20/2017 9:42:14 AM By: Alejandro Mulling Entered By: Alejandro Mulling on 08/17/2017 11:07:26 [name] [name], [name] [name] ([id number]) -------------------------------------------------------------------------------- Wound Assessment Details Patient Name: [name] [name]. Date of Service: 08/17/2017 10:30 AM Medical Record Number: [id number] Patient Account Number: [id number] Date of Birth/Sex: December 28, 1955 (61 y.o. Male) Treating RN: Phillis Haggis Primary Care Kemal Amores: Azucena Cecil, DAVID Other Clinician: Referring Rector Devonshire: Ovid Curd Treating Raijon Lindfors/Extender: STONE III, HOYT Weeks in Treatment: 0 Wound Status Wound Number: 1 Primary Diabetic Wound/Ulcer of the Lower Extremity Etiology: Wound Location: Right, Plantar Metatarsal head first Wound Status: Open Wounding Event: Gradually Appeared Comorbid Coronary Artery Disease, Hypertension, Date Acquired: 08/10/2017 History: Type II Diabetes Weeks Of Treatment: 0 Clustered Wound: No Photos Photo Uploaded By: Alejandro Mulling on 08/17/2017 11:50:12 Wound Measurements Length: (cm) 3 Width: (cm) 2.3 Depth: (cm) 0.1 Area: (cm) 5.419 Volume: (cm) 0.542 % Reduction in Area: -2664.8% % Reduction in Volume: -2610% Epithelialization: Large (67-100%) Tunneling: No Undermining: Yes Starting Position (o'clock): 12 Ending Position (o'clock): 12 Maximum Distance: (cm) 1.6 Wound Description Classification: Grade 1 Wound Margin: Distinct, outline attached Exudate Amount: Large Exudate Type: Serous Exudate Color: amber Foul Odor After Cleansing:  No Slough/Fibrino Yes Wound Bed Granulation Amount: None Present (0%) Necrotic Amount: Large (67-100%) Necrotic Quality: Eschar, Adherent Slough Periwound Skin Texture HANAN, MOEN ([id number]) Texture Color No Abnormalities Noted: No No Abnormalities Noted: No Callus: Yes Ecchymosis: Yes Scarring: Yes Temperature / Pain Moisture Temperature: No Abnormality No Abnormalities Noted: No Tenderness on Palpation: Yes Maceration: Yes Wound Preparation Ulcer Cleansing: Rinsed/Irrigated with Saline Topical Anesthetic Applied: Other:  lidocaine 4%, Electronic Signature(s) Signed: 08/20/2017 9:42:14 AM By: Alejandro Mulling Entered By: Alejandro Mulling on 08/17/2017 11:24:36 Dimichele, [name] [name] ([id number]) -------------------------------------------------------------------------------- Vitals Details Patient Name: [name] [name] Date of Service: 08/17/2017 10:30 AM Medical Record Number: [id number] Patient Account Number: [id number] Date of Birth/Sex: 04-13-1956 (61 y.o. Male) Treating RN: Phillis Haggis Primary Care Dagan Heinz: Azucena Cecil, DAVID Other Clinician: Referring Adesuwa Osgood: Ovid Curd Treating Jarita Raval/Extender: Linwood Dibbles, HOYT Weeks in Treatment: 0 Vital Signs Time Taken: 10:32 Temperature (F): 97.9 Height (in): 72 Pulse (bpm): 75 Source: Stated Respiratory Rate (breaths/min): 18 Weight (lbs): 238.9 Blood Pressure (mmHg): 154/86 Source: Measured Reference Range: 80 - 120 mg / dl Body Mass Index (BMI): 32.4 Electronic Signature(s) Signed: 08/20/2017 9:42:14 AM By: Alejandro Mulling Entered By: Alejandro Mulling on 08/17/2017 10:34:15

## 2017-08-20 NOTE — Progress Notes (Signed)
DAIDEN, COLTRANE ([id number]) Visit Report for 08/17/2017 Abuse/Suicide Risk Screen Details Patient Name: [name] [name], [name] [name]. Date of Service: 08/17/2017 10:30 AM Medical Record Number: [id number] Patient Account Number: [id number] Date of Birth/Sex: 1956/03/29 (61 y.o. Male) Treating RN: Phillis Haggis Primary Care Maisy Newport: Azucena Cecil, DAVID Other Clinician: Referring Nickey Canedo: Ovid Curd Treating Severin Bou/Extender: STONE III, HOYT Weeks in Treatment: 0 Abuse/Suicide Risk Screen Items Answer ABUSE/SUICIDE RISK SCREEN: Has anyone close to you tried to hurt or harm you recentlyo No Do you feel uncomfortable with anyone in your familyo No Has anyone forced you do things that you didnot want to doo No Do you have any thoughts of harming yourselfo No Patient displays signs or symptoms of abuse and/or neglect. No Electronic Signature(s) Signed: 08/20/2017 9:42:14 AM By: Alejandro Mulling Entered By: Alejandro Mulling on 08/17/2017 10:41:26 [place]ul[place], Kristine Garbe ([id number]) -------------------------------------------------------------------------------- Activities of Daily Living Details Patient Name: [name] [name]. Date of Service: 08/17/2017 10:30 AM Medical Record Number: [id number] Patient Account Number: [id number] Date of Birth/Sex: Sep 08, 1956 (61 y.o. Male) Treating RN: Phillis Haggis Primary Care Montavius Subramaniam: Azucena Cecil, DAVID Other Clinician: Referring Jenny Omdahl: Ovid Curd Treating Zavien Clubb/Extender: Linwood Dibbles, HOYT Weeks in Treatment: 0 Activities of Daily Living Items Answer Activities of Daily Living (Please select one for each item) Drive Automobile Completely Able Take Medications Completely Able Use Telephone Completely Able Care for Appearance Completely Able Use Toilet Completely Able Bath / Shower Completely Able Dress Self Completely Able Feed Self Completely Able Walk Completely Able Get In / Out Bed Completely Able Housework Completely Able Prepare Meals  Completely Able Handle Money Completely Able Shop for Self Completely Able Electronic Signature(s) Signed: 08/20/2017 9:42:14 AM By: Alejandro Mulling Entered By: Alejandro Mulling on 08/17/2017 10:41:41 Forinash, Kristine Garbe ([id number]) -------------------------------------------------------------------------------- Education Assessment Details Patient Name: [name] [name]. Date of Service: 08/17/2017 10:30 AM Medical Record Number: [id number] Patient Account Number: [id number] Date of Birth/Sex: 1956-03-27 (61 y.o. Male) Treating RN: Phillis Haggis Primary Care Jaques Mineer: Azucena Cecil, DAVID Other Clinician: Referring Ayaan Ringle: Ovid Curd Treating Nevayah Faust/Extender: Linwood Dibbles, HOYT Weeks in Treatment: 0 Primary Learner Assessed: Patient Learning Preferences/Education Level/Primary Language Learning Preference: Explanation, Printed Material Highest Education Level: College or Above Preferred Language: English Cognitive Barrier Assessment/Beliefs Language Barrier: No Translator Needed: No Memory Deficit: No Emotional Barrier: No Cultural/Religious Beliefs Affecting Medical No Care: Physical Barrier Assessment Impaired Vision: Yes Glasses Impaired Hearing: No Decreased Hand dexterity: No Knowledge/Comprehension Assessment Knowledge Level: Medium Comprehension Level: Medium Ability to understand written Medium instructions: Ability to understand verbal Medium instructions: Motivation Assessment Anxiety Level: Calm Cooperation: Cooperative Education Importance: Acknowledges Need Interest in Health Problems: Asks Questions Perception: Coherent Willingness to Engage in Self- Medium Management Activities: Readiness to Engage in Self- Medium Management Activities: Electronic Signature(s) BAYANI, RENTERIA ([id number]) Signed: 08/20/2017 9:42:14 AM By: Alejandro Mulling Entered By: Alejandro Mulling on 08/17/2017 10:42:00 [name] [name]  ([id number]) -------------------------------------------------------------------------------- Fall Risk Assessment Details Patient Name: [name] [name]. Date of Service: 08/17/2017 10:30 AM Medical Record Number: [id number] Patient Account Number: [id number] Date of Birth/Sex: 25-Mar-1956 (61 y.o. Male) Treating RN: Phillis Haggis Primary Care Markail Diekman: Azucena Cecil, DAVID Other Clinician: Referring Lodie Waheed: Ovid Curd Treating Sonam Wandel/Extender: Linwood Dibbles, HOYT Weeks in Treatment: 0 Fall Risk Assessment Items Have you had 2 or more falls in the last 12 monthso 0 No Have you had any fall that resulted in injury in the last 12 monthso 0 No FALL RISK ASSESSMENT: History of falling - immediate or within 3 months 0 No Secondary diagnosis 0 No Ambulatory aid None/bed rest/wheelchair/nurse  0 No Crutches/cane/walker 0 No Furniture 0 No IV Access/Saline Lock 0 No Gait/Training Normal/bed rest/immobile 0 No Weak 0 No Impaired 0 No Mental Status Oriented to own ability 0 Yes Electronic Signature(s) Signed: 08/20/2017 9:42:14 AM By: Alejandro Mulling Entered By: Alejandro Mulling on 08/17/2017 10:42:06 Reuter, Kristine Garbe ([id number]) -------------------------------------------------------------------------------- Foot Assessment Details Patient Name: [name] [name]. Date of Service: 08/17/2017 10:30 AM Medical Record Number: [id number] Patient Account Number: [id number] Date of Birth/Sex: [birth year] (61 y.o. Male) Treating RN: Phillis Haggis Primary Care Tinzlee Craker: Azucena Cecil, DAVID Other Clinician: Referring Jesenia Spera: Ovid Curd Treating Klaira Pesci/Extender: Linwood Dibbles, HOYT Weeks in Treatment: 0 Foot Assessment Items Site Locations + = Sensation present, - = Sensation absent, C = Callus, U = Ulcer R = Redness, W = Warmth, M = Maceration, PU = Pre-ulcerative lesion F = Fissure, S = Swelling, D = Dryness Assessment Right: Left: Other Deformity: No No Prior Foot Ulcer: No No Prior  Amputation: No No Charcot Joint: No No Ambulatory Status: Ambulatory Without Help Gait: Steady Electronic Signature(s) Signed: 08/20/2017 9:42:14 AM By: Alejandro Mulling Entered By: Alejandro Mulling on 08/17/2017 10:45:39 Mui, Kristine Garbe ([id number]) -------------------------------------------------------------------------------- Nutrition Risk Assessment Details Patient Name: [name] [name]. Date of Service: 08/17/2017 10:30 AM Medical Record Number: [id number] Patient Account Number: [id number] Date of Birth/Sex: 12-Feb-1956 (61 y.o. Male) Treating RN: Phillis Haggis Primary Care Anelia Carriveau: Azucena Cecil, DAVID Other Clinician: Referring Daanya Lanphier: Ovid Curd Treating Druanne Bosques/Extender: Linwood Dibbles, HOYT Weeks in Treatment: 0 Height (in): 72 Weight (lbs): 238.9 Body Mass Index (BMI): 32.4 Nutrition Risk Assessment Items NUTRITION RISK SCREEN: I have an illness or condition that made me change the kind and/or 0 No amount of food I eat I eat fewer than two meals per day 0 No I eat few fruits and vegetables, or milk products 0 No I have three or more drinks of beer, liquor or wine almost every day 0 No I have tooth or mouth problems that make it hard for me to eat 0 No I don't always have enough money to buy the food I need 0 No I eat alone most of the time 0 No I take three or more different prescribed or over-the-counter drugs a 1 Yes day Without wanting to, I have lost or gained 10 pounds in the last six 0 No months I am not always physically able to shop, cook and/or feed myself 0 No Nutrition Protocols Good Risk Protocol 0 No interventions needed Moderate Risk Protocol Electronic Signature(s) Signed: 08/20/2017 9:42:14 AM By: Alejandro Mulling Entered By: Alejandro Mulling on 08/17/2017 10:42:14

## 2017-08-24 ENCOUNTER — Ambulatory Visit: Payer: 59 | Admitting: Podiatry

## 2017-08-24 DIAGNOSIS — E1142 Type 2 diabetes mellitus with diabetic polyneuropathy: Secondary | ICD-10-CM | POA: Diagnosis not present

## 2017-08-24 DIAGNOSIS — N183 Chronic kidney disease, stage 3 (moderate): Secondary | ICD-10-CM | POA: Diagnosis not present

## 2017-08-24 DIAGNOSIS — E1122 Type 2 diabetes mellitus with diabetic chronic kidney disease: Secondary | ICD-10-CM | POA: Diagnosis not present

## 2017-08-31 ENCOUNTER — Ambulatory Visit: Payer: 59 | Admitting: Surgery

## 2017-09-03 DIAGNOSIS — Z23 Encounter for immunization: Secondary | ICD-10-CM | POA: Diagnosis not present

## 2017-09-03 DIAGNOSIS — I129 Hypertensive chronic kidney disease with stage 1 through stage 4 chronic kidney disease, or unspecified chronic kidney disease: Secondary | ICD-10-CM | POA: Diagnosis not present

## 2017-09-03 DIAGNOSIS — E782 Mixed hyperlipidemia: Secondary | ICD-10-CM | POA: Diagnosis not present

## 2017-09-03 DIAGNOSIS — N183 Chronic kidney disease, stage 3 (moderate): Secondary | ICD-10-CM | POA: Diagnosis not present

## 2017-09-07 DIAGNOSIS — E871 Hypo-osmolality and hyponatremia: Secondary | ICD-10-CM | POA: Diagnosis not present

## 2017-09-07 DIAGNOSIS — G4733 Obstructive sleep apnea (adult) (pediatric): Secondary | ICD-10-CM | POA: Diagnosis not present

## 2017-09-14 DIAGNOSIS — E871 Hypo-osmolality and hyponatremia: Secondary | ICD-10-CM | POA: Diagnosis not present

## 2017-10-08 DIAGNOSIS — G4733 Obstructive sleep apnea (adult) (pediatric): Secondary | ICD-10-CM | POA: Diagnosis not present

## 2017-11-07 DIAGNOSIS — G4733 Obstructive sleep apnea (adult) (pediatric): Secondary | ICD-10-CM | POA: Diagnosis not present

## 2018-02-12 DIAGNOSIS — R8279 Other abnormal findings on microbiological examination of urine: Secondary | ICD-10-CM | POA: Diagnosis not present

## 2018-02-12 DIAGNOSIS — N35011 Post-traumatic bulbous urethral stricture: Secondary | ICD-10-CM | POA: Diagnosis not present

## 2018-02-12 DIAGNOSIS — N390 Urinary tract infection, site not specified: Secondary | ICD-10-CM | POA: Diagnosis not present

## 2018-02-12 DIAGNOSIS — Z125 Encounter for screening for malignant neoplasm of prostate: Secondary | ICD-10-CM | POA: Diagnosis not present

## 2018-02-12 DIAGNOSIS — B9689 Other specified bacterial agents as the cause of diseases classified elsewhere: Secondary | ICD-10-CM | POA: Diagnosis not present

## 2018-02-22 ENCOUNTER — Other Ambulatory Visit: Payer: Self-pay | Admitting: Urology

## 2018-04-17 ENCOUNTER — Encounter (HOSPITAL_BASED_OUTPATIENT_CLINIC_OR_DEPARTMENT_OTHER): Payer: Self-pay | Admitting: *Deleted

## 2018-04-18 ENCOUNTER — Encounter (HOSPITAL_BASED_OUTPATIENT_CLINIC_OR_DEPARTMENT_OTHER): Payer: Self-pay | Admitting: *Deleted

## 2018-04-18 ENCOUNTER — Other Ambulatory Visit: Payer: Self-pay

## 2018-04-18 NOTE — H&P (Signed)
HPI: [name] [name] is a 62 year-old male patient with a urethral stricture.  He does have a history of a urethral stricture. He does have to strain or bear down to start his urinary stream. He does have a split stream when he urinates. He does not have a good size and strength to his urinary stream. He does have frequency. He is having problems emptying his bladder.   He does not have a previous history of sexually transmitted diseases. He has had a history of trauma to the urethra . He has not received radiation therapy. His symptoms have not gotten worse over the last year.   He has not previously had an indwelling catheter in for more than two weeks at a time. This condition would be considered of mild to moderate severity with no modifying factors or associated signs or symptoms other than as noted above.   02/12/18: He reports to me that over a period of about 5 years he has noted progressive slowing of his urinary stream with decreased force. He denies any dysuria or hematuria.    ALLERGIES: No Allergies    MEDICATIONS: Metoprolol Tartrate 100 mg tablet  Omeprazole 40 mg capsule,delayed release  Viagra 100 mg tablet  Acc-Chek Fastclix Lancets  Alli CAPS Oral  ALPRAZolam TABS Oral  Aspirin 325 MG Oral Tablet Oral  Benicar Hct 40 mg-25 mg tablet Oral  Fenofibrate 160 mg tablet  Fish Oil  Lipitor 80 mg tablet Oral  Lovaza 1 gram capsule Oral  Metoprolol Tartrate 50 mg tablet Oral  Novofine  Onetouch Ultra Test Strips strip In Vitro  Potassium Chloride 20 meq tablet, extended release  Prandin 2 mg tablet  Rapaflo 8 mg capsule 1 Oral Daily  Ultra-Fine Original Pen Needle 29 gauge x 1/2" needle, disposable Does Not Apply  Viagra 100 mg tablet Oral  Victoza 18 MG/3ML SOLN Subcutaneous  Wellbutrin Xl 300 mg tablet, extended release 24 hr Oral     GU PSH: Cysto Dilate Stricture (M or F) - 2011 Cystoscopy And Treatment - 2009 Cystoscopy VIU - 2009, 2009 Cystoscopy; Implant Stent -  2012      PSH Notes: Cystoscopy With Insertion Of Permanent Urethral Stent, Cystoscopy For Urethral Stricture, Coronary Artery Bypass Graft (CABG), Cystoscopy With Insertion Of Urethral Stent, Cystoscopy With Internal Urethrotomy, Direct Vision, Cystoscopy With Internal Urethrotomy, Direct Vision, Cystoscopy For Urethral Stricture With Steroid Injection   NON-GU PSH: CABG (coronary artery bypass grafting) - 2011    GU PMH: ED due to arterial insufficiency, Erectile dysfunction due to arterial insufficiency - 2014 Personal Hx Oth Urinary System diseases, History of urethral stricture - 2014 Urethral Stricture, Unspec, Urethral stricture - 2014      PMH Notes: Urethral stricture disease: In 4/09 he was seen evaluated and treated for a deep bulbar urethral stricture with visual internal urethrotomy and injection of steroids to try to prevent re-formation of the stricture.  He redevelop stricture and in 8/09 underwent placement of a UroLume stent.  He then required dilatation with filiforms and followers at the time of an unrelated surgery in 4/11.  Balloon dilatation 08/04/11 with placement of a 2nd, more distal UroLume stent.   NON-GU PMH: Anxiety, Anxiety - 2014 Personal history of other diseases of the circulatory system, History of cardiac disorder - 2014, History of hypertension, - 2014 Personal history of other diseases of the digestive system, History of esophageal reflux - 2014 Personal history of other diseases of the nervous system and sense organs, History of  sleep apnea - 2014 Personal history of other endocrine, nutritional and metabolic disease, History of hypercholesterolemia - 2014, History of diabetes mellitus, - 2014 Personal history of other mental and behavioral disorders, History of depression - 2014 Diabetes Type 2 GERD Hypercholesterolemia Hypertension Sleep Apnea    FAMILY HISTORY: 1 Daughter - Runs in Family 2 sons - Runs in Family Death In The Family Father -  Father Death In The Family Mother - Mother Family Health Status Number - Runs In Family   SOCIAL HISTORY: Marital Status: Married Preferred Language: English; Ethnicity: Not Hispanic Or Latino; Race: White Current Smoking Status: Patient has never smoked.   Tobacco Use Assessment Completed: Used Tobacco in last 30 days? Social Drinker.  Drinks 4+ caffeinated drinks per day. Patient's occupation [employer]     Notes: Never A Smoker, Tobacco Use, Caffeine Use, Marital History - Currently Married, Alcohol Use, Occupation:   REVIEW OF SYSTEMS:    GU Review Male:   Patient reports get up at night to urinate, leakage of urine, have to strain to urinate , and erection problems. Patient denies frequent urination, hard to postpone urination, burning/ pain with urination, stream starts and stops, trouble starting your stream, and penile pain.  Gastrointestinal (Upper):   Patient denies nausea, vomiting, and indigestion/ heartburn.  Gastrointestinal (Lower):   Patient denies diarrhea and constipation.  Constitutional:   Patient denies fever, night sweats, weight loss, and fatigue.  Skin:   Patient denies skin rash/ lesion and itching.  Eyes:   Patient denies blurred vision and double vision.  Ears/ Nose/ Throat:   Patient denies sore throat and sinus problems.  Hematologic/Lymphatic:   Patient denies swollen glands and easy bruising.  Cardiovascular:   Patient denies leg swelling and chest pains.  Respiratory:   Patient denies cough and shortness of breath.  Endocrine:   Patient denies excessive thirst.  Musculoskeletal:   Patient denies back pain and joint pain.  Neurological:   Patient denies headaches and dizziness.  Psychologic:   Patient reports anxiety. Patient denies depression.   VITAL SIGNS:    Weight 244 lb / 110.68 kg  Height 72 in / 182.88 cm  BMI 33.1 kg/m   GU PHYSICAL EXAMINATION:    Anus and Perineum: No hemorrhoids. No anal stenosis. No rectal  fissure, no anal fissure. No edema, no dimple, no perineal tenderness, no anal tenderness.  Scrotum: No lesions. No edema. No cysts. No warts.  Epididymides: Right: no spermatocele, no masses, no cysts, no tenderness, no induration, no enlargement. Left: no spermatocele, no masses, no cysts, no tenderness, no induration, no enlargement.  Testes: No tenderness, no swelling, no enlargement left testes. No tenderness, no swelling, no enlargement right testes. Normal location left testes. Normal location right testes. No mass, no cyst, no varicocele, no hydrocele left testes. No mass, no cyst, no varicocele, no hydrocele right testes.  Urethral Meatus: Normal size. No lesion, no wart, no discharge, no polyp. Normal location.  Penis: Circumcised, no warts, no cracks. No dorsal Peyronie's plaques, no left corporal Peyronie's plaques, no right corporal Peyronie's plaques, no scarring, no warts. No balanitis, no meatal stenosis.  Prostate: Prostate 1 1/2+ size. Left lobe normal consistency, right lobe normal consistency. Symmetrical lobes. No prostate nodule. Left lobe no tenderness, right lobe no tenderness.   Seminal Vesicles: Nonpalpable.  Sphincter Tone: Normal sphincter. No rectal tenderness. No rectal mass.    MULTI-SYSTEM PHYSICAL EXAMINATION:    Constitutional: Well-nourished. No physical deformities. Normally developed. Good  grooming.  Neck: Neck symmetrical, not swollen. Normal tracheal position.  Respiratory: No labored breathing, no use of accessory muscles. Normal breath sounds.  Cardiovascular: Normal temperature, normal extremity pulses, no swelling, no varicosities.   Lymphatic: No enlargement of neck, axillae, groin.  Skin: No paleness, no jaundice, no cyanosis. No lesion, no ulcer, no rash.  Neurologic / Psychiatric: Oriented to time, oriented to place, oriented to person. No depression, no anxiety, no agitation.  Gastrointestinal: No mass, no tenderness, no rigidity, non obese abdomen.   Eyes: Normal conjunctivae. Normal eyelids.  Ears, Nose, Mouth, and Throat: Left ear no scars, no lesions, no masses. Right ear no scars, no lesions, no masses. Nose no scars, no lesions, no masses. Normal hearing. Normal lips.  Musculoskeletal: Normal gait and station of head and neck.     PAST DATA REVIEWED:  Source Of History:  Patient  Lab Test Review:   BUN/Creatinine  Records Review:   Previous Hospital Records, Previous Patient Records, POC Tool   04/20/05  Hormones  Testosterone, Total 3.49    Notes:                     A creatinine in 8/15 was noted 1.7.   PROCEDURES:         Flexible Cystoscopy - done on 02/12/18 Risks, benefits, and some of the potential complications of the procedure were discussed at length with the patient including infection, bleeding, voiding discomfort, urinary retention, fever, chills, sepsis, and others. All questions were answered. Informed consent was obtained. Sterile technique and 2% Lidocaine intraurethral analgesia were used.  Meatus:  Normal size. Normal location. Normal condition.  Urethra:  Severe bulbous stricture.      The lower urinary tract was carefully examined. The procedure was well-tolerated and without complications. Instructions were given to call the office immediately for bloody urine, difficulty urinating, urinary retention, painful or frequent urination, fever or other illness. The patient stated that he understood these instructions and would comply with them.         Urinalysis Dipstick Dipstick Cont'd Micro  Color: Yellow Bilirubin: Neg WBC/hpf: 40 - 60/hpf  Appearance: Turbid Ketones: Neg RBC/hpf: 3 - 10/hpf  Specific Gravity: 1.025 Blood: 3+ Bacteria: Many (>50/hpf)  pH: 5.5 Protein: 2+ Cystals: NS (Not Seen)  Glucose: 3+ Urobilinogen: 0.2 Casts: NS (Not Seen)    Nitrites: Neg Trichomonas: Not Present    Leukocyte Esterase: 3+ Mucous: Not Present      Epithelial Cells: NS (Not Seen)      Yeast: NS (Not Seen)       Sperm: Not Present    Notes: unspun microscopic    ASSESSMENT/PLAN:      ICD-10 Details  1 GU:   Bulbar urethral stricture - N35.011 He has had a recurrence of his bulbar urethral stricture. It is in the very deep bulb placing it in close approximation to the sphincter. We therefore discussed balloon dilation. He may require self catheterization/dilatation to maintain this patent over time.  2   ED due to arterial insufficiency - N52.01 He has elected to use Muse at a 1000 mcg dose. This was sent in to his pharmacy.  4   Encounter for Prostate Cancer screening - Z12.5 His prostate was noted to be smooth and benign. He has not had a PSA done recently. A screening PSA will therefore be obtained today.  3 NON-GU:   Pyuria/other UA findings - R82.79 He did have some pyuria and bacteriuria though his urine  was cultured and grew Serratia.  He was treated with Cipro.

## 2018-04-18 NOTE — Anesthesia Preprocedure Evaluation (Addendum)
Anesthesia Evaluation  Patient identified by MRN, date of birth, ID band Patient awake    Reviewed: Allergy & Precautions, NPO status , Patient's Chart, lab work & pertinent test results  Airway Mallampati: II  TM Distance: >3 FB     Dental   Pulmonary sleep apnea ,    breath sounds clear to auscultation       Cardiovascular hypertension, + CAD   Rhythm:Regular Rate:Normal     Neuro/Psych    GI/Hepatic Neg liver ROS, GERD  ,  Endo/Other  diabetes  Renal/GU negative Renal ROS     Musculoskeletal   Abdominal   Peds  Hematology   Anesthesia Other Findings   Reproductive/Obstetrics                            Anesthesia Physical Anesthesia Plan  ASA: III  Anesthesia Plan: General   Post-op Pain Management:    Induction: Intravenous  PONV Risk Score and Plan: Treatment may vary due to age or medical condition, Ondansetron, Dexamethasone and Midazolam  Airway Management Planned: LMA  Additional Equipment:   Intra-op Plan:   Post-operative Plan: Extubation in OR  Informed Consent: I have reviewed the patients History and Physical, chart, labs and discussed the procedure including the risks, benefits and alternatives for the proposed anesthesia with the patient or authorized representative who has indicated his/her understanding and acceptance.   Dental advisory given  Plan Discussed with: CRNA and Anesthesiologist  Anesthesia Plan Comments:         Anesthesia Quick Evaluation

## 2018-04-18 NOTE — Progress Notes (Signed)
Spoke w/ pt via phone for pre-op interview.  Npo after mn.  Arrive at 0530.  Needs istat 8 and ekg.  Will take wellbutrin, lipitor, and lopressor am dos w/ sips of water.

## 2018-04-19 ENCOUNTER — Ambulatory Visit (HOSPITAL_BASED_OUTPATIENT_CLINIC_OR_DEPARTMENT_OTHER): Payer: 59 | Admitting: Anesthesiology

## 2018-04-19 ENCOUNTER — Encounter (HOSPITAL_BASED_OUTPATIENT_CLINIC_OR_DEPARTMENT_OTHER)
Admission: RE | Disposition: A | Discharge status: Home / Self Care | Payer: Self-pay | Source: Ambulatory Visit | Attending: Urology

## 2018-04-19 ENCOUNTER — Ambulatory Visit (HOSPITAL_BASED_OUTPATIENT_CLINIC_OR_DEPARTMENT_OTHER)
Admission: RE | Admit: 2018-04-19 | Discharge: 2018-04-19 | Disposition: A | Discharge status: Home / Self Care | Payer: 59 | Source: Ambulatory Visit | Attending: Urology | Admitting: Urology

## 2018-04-19 ENCOUNTER — Encounter (HOSPITAL_BASED_OUTPATIENT_CLINIC_OR_DEPARTMENT_OTHER): Payer: Self-pay

## 2018-04-19 DIAGNOSIS — Z7984 Long term (current) use of oral hypoglycemic drugs: Secondary | ICD-10-CM | POA: Insufficient documentation

## 2018-04-19 DIAGNOSIS — N99113 Postprocedural anterior urethral stricture: Secondary | ICD-10-CM | POA: Diagnosis not present

## 2018-04-19 DIAGNOSIS — I10 Essential (primary) hypertension: Secondary | ICD-10-CM | POA: Insufficient documentation

## 2018-04-19 DIAGNOSIS — G4730 Sleep apnea, unspecified: Secondary | ICD-10-CM | POA: Diagnosis not present

## 2018-04-19 DIAGNOSIS — N35912 Unspecified bulbous urethral stricture, male: Secondary | ICD-10-CM

## 2018-04-19 DIAGNOSIS — N5201 Erectile dysfunction due to arterial insufficiency: Secondary | ICD-10-CM | POA: Diagnosis not present

## 2018-04-19 DIAGNOSIS — I2510 Atherosclerotic heart disease of native coronary artery without angina pectoris: Secondary | ICD-10-CM | POA: Insufficient documentation

## 2018-04-19 DIAGNOSIS — N3289 Other specified disorders of bladder: Secondary | ICD-10-CM | POA: Insufficient documentation

## 2018-04-19 DIAGNOSIS — E119 Type 2 diabetes mellitus without complications: Secondary | ICD-10-CM | POA: Insufficient documentation

## 2018-04-19 DIAGNOSIS — E7800 Pure hypercholesterolemia, unspecified: Secondary | ICD-10-CM | POA: Insufficient documentation

## 2018-04-19 DIAGNOSIS — Z7982 Long term (current) use of aspirin: Secondary | ICD-10-CM | POA: Insufficient documentation

## 2018-04-19 DIAGNOSIS — Z951 Presence of aortocoronary bypass graft: Secondary | ICD-10-CM | POA: Insufficient documentation

## 2018-04-19 DIAGNOSIS — K219 Gastro-esophageal reflux disease without esophagitis: Secondary | ICD-10-CM | POA: Insufficient documentation

## 2018-04-19 DIAGNOSIS — N35011 Post-traumatic bulbous urethral stricture: Secondary | ICD-10-CM | POA: Diagnosis not present

## 2018-04-19 DIAGNOSIS — F419 Anxiety disorder, unspecified: Secondary | ICD-10-CM | POA: Insufficient documentation

## 2018-04-19 DIAGNOSIS — Z79899 Other long term (current) drug therapy: Secondary | ICD-10-CM | POA: Diagnosis not present

## 2018-04-19 DIAGNOSIS — N35812 Other urethral bulbous stricture, male: Secondary | ICD-10-CM | POA: Diagnosis not present

## 2018-04-19 HISTORY — DX: Personal history of other diseases of the digestive system: Z87.19

## 2018-04-19 HISTORY — PX: CYSTOSCOPY WITH URETHRAL DILATATION: SHX5125

## 2018-04-19 HISTORY — DX: Obstructive sleep apnea (adult) (pediatric): G47.33

## 2018-04-19 HISTORY — DX: Gastro-esophageal reflux disease without esophagitis: K21.9

## 2018-04-19 HISTORY — DX: Unspecified bulbous urethral stricture, male: N35.912

## 2018-04-19 HISTORY — DX: Presence of aortocoronary bypass graft: Z95.1

## 2018-04-19 HISTORY — DX: Obstructive sleep apnea (adult) (pediatric): Z99.89

## 2018-04-19 HISTORY — DX: Anxiety disorder, unspecified: F41.9

## 2018-04-19 LAB — GLUCOSE, CAPILLARY
Glucose-Capillary: 390 mg/dL — ABNORMAL HIGH (ref 65–99)
Glucose-Capillary: 378 mg/dL — ABNORMAL HIGH (ref 65–99)

## 2018-04-19 LAB — POCT I-STAT, CHEM 8
BUN: 23 mg/dL — ABNORMAL HIGH (ref 6–20)
Calcium, Ion: 1.16 mmol/L (ref 1.15–1.40)
Chloride: 98 mmol/L — ABNORMAL LOW (ref 101–111)
Creatinine, Ser: 1.5 mg/dL — ABNORMAL HIGH (ref 0.61–1.24)
Glucose, Bld: 441 mg/dL — ABNORMAL HIGH (ref 65–99)
HCT: 40 % (ref 39.0–52.0)
Hemoglobin: 13.6 g/dL (ref 13.0–17.0)
Potassium: 3.9 mmol/L (ref 3.5–5.1)
Sodium: 134 mmol/L — ABNORMAL LOW (ref 135–145)
TCO2: 25 mmol/L (ref 22–32)

## 2018-04-19 SURGERY — CYSTOSCOPY, WITH URETHRAL DILATION
Anesthesia: General

## 2018-04-19 MED ORDER — MIDAZOLAM HCL 5 MG/5ML IJ SOLN
INTRAMUSCULAR | Status: DC | PRN
  Administered 2018-04-19: 2 mg via INTRAVENOUS

## 2018-04-19 MED ORDER — LIDOCAINE 2% (20 MG/ML) 5 ML SYRINGE
INTRAMUSCULAR | Status: AC
  Filled 2018-04-19: qty 5

## 2018-04-19 MED ORDER — ONDANSETRON HCL 4 MG/2ML IJ SOLN
INTRAMUSCULAR | Status: AC
Start: 2018-04-19 — End: ?
  Filled 2018-04-19: qty 2

## 2018-04-19 MED ORDER — LACTATED RINGERS IV SOLN
INTRAVENOUS | Status: DC
  Administered 2018-04-19: 07:00:00 via INTRAVENOUS
  Filled 2018-04-19: qty 1000

## 2018-04-19 MED ORDER — ONDANSETRON HCL 4 MG/2ML IJ SOLN
INTRAMUSCULAR | Status: DC | PRN
  Administered 2018-04-19: 4 mg via INTRAVENOUS

## 2018-04-19 MED ORDER — CIPROFLOXACIN IN D5W 400 MG/200ML IV SOLN
INTRAVENOUS | Status: AC
  Filled 2018-04-19: qty 200

## 2018-04-19 MED ORDER — FENTANYL CITRATE (PF) 100 MCG/2ML IJ SOLN
25.0000 ug | INTRAMUSCULAR | Status: DC | PRN
  Filled 2018-04-19: qty 1

## 2018-04-19 MED ORDER — FENTANYL CITRATE (PF) 100 MCG/2ML IJ SOLN
INTRAMUSCULAR | Status: DC | PRN
  Administered 2018-04-19: 50 ug via INTRAVENOUS

## 2018-04-19 MED ORDER — CIPROFLOXACIN IN D5W 400 MG/200ML IV SOLN
400.0000 mg | Freq: Once | INTRAVENOUS | Status: AC
  Administered 2018-04-19: 200 mg via INTRAVENOUS
  Filled 2018-04-19: qty 200

## 2018-04-19 MED ORDER — STERILE WATER FOR IRRIGATION IR SOLN
Status: DC | PRN
  Administered 2018-04-19: 3000 mL

## 2018-04-19 MED ORDER — PROPOFOL 10 MG/ML IV BOLUS
INTRAVENOUS | Status: DC | PRN
  Administered 2018-04-19: 200 mg via INTRAVENOUS

## 2018-04-19 MED ORDER — FENTANYL CITRATE (PF) 100 MCG/2ML IJ SOLN
INTRAMUSCULAR | Status: AC
  Filled 2018-04-19: qty 2

## 2018-04-19 MED ORDER — MIDAZOLAM HCL 2 MG/2ML IJ SOLN
INTRAMUSCULAR | Status: AC
  Filled 2018-04-19: qty 2

## 2018-04-19 MED ORDER — PROPOFOL 10 MG/ML IV BOLUS
INTRAVENOUS | Status: AC
  Filled 2018-04-19: qty 40

## 2018-04-19 MED ORDER — LIDOCAINE 2% (20 MG/ML) 5 ML SYRINGE
INTRAMUSCULAR | Status: DC | PRN
  Administered 2018-04-19: 100 mg via INTRAVENOUS

## 2018-04-19 SURGICAL SUPPLY — 29 items
BAG DRAIN URO-CYSTO SKYTR STRL (DRAIN) ×2 IMPLANT
BAG DRN UROCATH (DRAIN) ×1
BAG URINE DRAINAGE (UROLOGICAL SUPPLIES) IMPLANT
BALLN NEPHROSTOMY (BALLOONS) ×2
BALLOON NEPHROSTOMY (BALLOONS) IMPLANT
CATH FOLEY 2WAY SLVR  5CC 14FR (CATHETERS)
CATH FOLEY 2WAY SLVR  5CC 16FR (CATHETERS)
CATH FOLEY 2WAY SLVR 5CC 14FR (CATHETERS) IMPLANT
CATH FOLEY 2WAY SLVR 5CC 16FR (CATHETERS) IMPLANT
CLOTH BEACON ORANGE TIMEOUT ST (SAFETY) ×4 IMPLANT
ELECT REM PT RETURN 9FT ADLT (ELECTROSURGICAL)
ELECTRODE REM PT RTRN 9FT ADLT (ELECTROSURGICAL) IMPLANT
GLOVE BIO SURGEON STRL SZ8 (GLOVE) ×2 IMPLANT
GLOVE SURG SS PI 6.5 STRL IVOR (GLOVE) ×2 IMPLANT
GOWN STRL REUS W/ TWL XL LVL3 (GOWN DISPOSABLE) ×1 IMPLANT
GOWN STRL REUS W/TWL XL LVL3 (GOWN DISPOSABLE) ×2
GUIDEWIRE STR DUAL SENSOR (WIRE) ×1 IMPLANT
IV NS IRRIG 3000ML ARTHROMATIC (IV SOLUTION) IMPLANT
KIT TURNOVER CYSTO (KITS) ×2 IMPLANT
MANIFOLD NEPTUNE II (INSTRUMENTS) ×1 IMPLANT
NDL SAFETY ECLIPSE 18X1.5 (NEEDLE) IMPLANT
NEEDLE HYPO 18GX1.5 SHARP (NEEDLE)
NEEDLE HYPO 22GX1.5 SAFETY (NEEDLE) IMPLANT
NS IRRIG 500ML POUR BTL (IV SOLUTION) IMPLANT
PACK CYSTO (CUSTOM PROCEDURE TRAY) ×2 IMPLANT
SYR 20CC LL (SYRINGE) IMPLANT
SYR 30ML LL (SYRINGE) ×1 IMPLANT
TUBE CONNECTING 12X1/4 (SUCTIONS) IMPLANT
WATER STERILE IRR 3000ML UROMA (IV SOLUTION) ×3 IMPLANT

## 2018-04-19 NOTE — Discharge Instructions (Signed)
Cystoscopy patient instructions ° °Following a cystoscopy, a catheter (a flexible rubber tube) is sometimes left in place to empty the bladder. This may cause some discomfort or a feeling that you need to urinate. Your doctor determines the period of time that the catheter will be left in place. °You may have bloody urine for two to three days (Call your doctor if the amount of bleeding increases or does not subside). ° °You may pass blood clots in your urine, especially if you had a biopsy. It is not unusual to pass small blood clots and have some bloody urine a couple of weeks after your cystoscopy. Again, call your doctor if the bleeding does not subside. °You may have: °Dysuria (painful urination) °Frequency (urinating often) °Urgency (strong desire to urinate) ° °These symptoms are common especially if medicine is instilled into the bladder or a ureteral stent is placed. Avoiding alcohol and caffeine, such as coffee, tea, and chocolate, may help relieve these symptoms. Drink plenty of water, unless otherwise instructed. Your doctor may also prescribe an antibiotic or other medicine to reduce these symptoms. ° °Cystoscopy results are available soon after the procedure; biopsy results usually take two to four days. Your doctor will discuss the results of your exam with you. Before you go home, you will be given specific instructions for follow-up care. °Special Instructions: ° °1  If you are going home with a catheter in place do not take a tub bath until removed by your doctor.  °2  You may resume your normal activities.  °3  Do not drive or operate machinery if you are taking narcotic pain medicine.  °4  Be sure to keep all follow-up appointments with your doctor.  ° °5 Call Your Doctor If: The catheter is not draining ° You have severe pain ° You are unable to urinate ° You have a fever over 101 ° You have severe bleeding °        ° °Post Anesthesia Home Care Instructions ° °Activity: °Get plenty of rest for  the remainder of the day. A responsible individual must stay with you for 24 hours following the procedure.  °For the next 24 hours, DO NOT: °-Drive a car °-Operate machinery °-Drink alcoholic beverages °-Take any medication unless instructed by your physician °-Make any legal decisions or sign important papers. ° °Meals: °Start with liquid foods such as gelatin or soup. Progress to regular foods as tolerated. Avoid greasy, spicy, heavy foods. If nausea and/or vomiting occur, drink only clear liquids until the nausea and/or vomiting subsides. Call your physician if vomiting continues. ° °Special Instructions/Symptoms: °Your throat may feel dry or sore from the anesthesia or the breathing tube placed in your throat during surgery. If this causes discomfort, gargle with warm salt water. The discomfort should disappear within 24 hours. ° °If you had a scopolamine patch placed behind your ear for the management of post- operative nausea and/or vomiting: ° °1. The medication in the patch is effective for 72 hours, after which it should be removed.  Wrap patch in a tissue and discard in the trash. Wash hands thoroughly with soap and water. °2. You may remove the patch earlier than 72 hours if you experience unpleasant side effects which may include dry mouth, dizziness or visual disturbances. °3. Avoid touching the patch. Wash your hands with soap and water after contact with the patch. °  ° °

## 2018-04-19 NOTE — Op Note (Signed)
PATIENT:  [name] [name]  PRE-OPERATIVE DIAGNOSIS: Bulbar urethral stricture  POST-OPERATIVE DIAGNOSIS: Same  PROCEDURE: 1.  Cystoscopy with balloon dilatation of urethral stricture  2.  Fluoroscopy use less than 1 hour  SURGEON:  Claybon Jabs  INDICATION: [name] [name] is a 62 year old male with a history of urethral stricture disease.  He has undergone dilation, VIU and UroLume stent placement in the past.  He recently presented with slowing of his urinary stream and was found to have recurrent bulbar urethral stricture.  ANESTHESIA:  General  EBL:  Minimal  DRAINS: None  LOCAL MEDICATIONS USED:  None  SPECIMEN: None  Description of procedure: After informed consent the patient was taken to the operating room and placed on the table in a supine position. General anesthesia was then administered. Once fully anesthetized the patient was moved to the dorsal lithotomy position and the genitalia were sterilely prepped and draped in standard fashion. An official timeout was then performed.  The 26 French rigid cystoscope with 30 degree lens was advanced down the urethra under direct visualization.  As I reached the distal bulbar urethra I noted mild narrowing that would not allow the scope beyond this.  It was photographed.  In the deep bulb at about the level nearly of the membranous urethra a tight stricture was identified.  I passed a 0.038 inch sensor guidewire through the cystoscope and through the area of stricture into the bladder which was confirmed with fluoroscopy.  I removed the cystoscope and then performed balloon dilatation.  The UroMax dilating balloon was passed over the guidewire and across the area of stricture and inflated to 18 atm.  It was left inflated for approximately 1 minute and then deflated.  The guidewire was left in place and the balloon was removed.  I repeated cystoscopy and I was able to advance the scope beyond the initial area of narrowing and into the  bulbar urethra where I noted the stricture then dilated and this allowed easy passage of the scope into the area of the prostatic urethra which was noted to be normal.  The bladder was then entered and noted to have 1+ trabeculation.  I noted, on full and systematic inspection, no tumors, stones or inflammatory lesions.  The right and left ureteral orifice were noted to be of normal configuration and position.  I removed the cystoscope after draining the bladder and the patient was awakened and taken to the recovery room in stable and satisfactory condition.  He tolerated the procedure well and there were no intraoperative complications.  We had discussed preoperatively the fact that a formal urethroplasty with 2 previous UroLume stents in place would likely be a fairly large undertaking.  An alternative to maintain a patent stricture would be to perform self dilation/catheterization.  I discussed this with the patient in detail and he agrees to proceed in this fashion and therefore will return to the office to be taught self dilation.  This will then be tailored to a frequency that affords him the ability to maintain the stricture open and hopefully prevent any further procedures in the future.  PLAN OF CARE: Discharge to home after PACU  PATIENT DISPOSITION:  PACU - hemodynamically stable.

## 2018-04-19 NOTE — Transfer of Care (Signed)
Immediate Anesthesia Transfer of Care Note  Patient: [name] [name]  Procedure(s) Performed: CYSTOSCOPY WITH BALLOON URETHRAL DILATATION (N/A )  Patient Location: PACU  Anesthesia Type:General  Level of Consciousness: awake, alert  and oriented  Airway & Oxygen Therapy: Patient Spontanous Breathing and Patient connected to nasal cannula oxygen  Post-op Assessment: Report given to RN  Post vital signs: Reviewed and stable  Last Vitals:  Vitals Value Taken Time  BP 124/92 04/19/2018  7:55 AM  Temp    Pulse 83 04/19/2018  7:56 AM  Resp 14 04/19/2018  7:56 AM  SpO2 98 % 04/19/2018  7:56 AM  Vitals shown include unvalidated device data.  Last Pain:  Vitals:   04/19/18 0602  TempSrc:   PainSc: 0-No pain      Patients Stated Pain Goal: 5 (04/19/18 0602)  Complications: No apparent anesthesia complications

## 2018-04-19 NOTE — Anesthesia Postprocedure Evaluation (Signed)
Anesthesia Post Note  Patient: [name] [name]  Procedure(s) Performed: CYSTOSCOPY WITH BALLOON URETHRAL DILATATION (N/A )     Patient location during evaluation: PACU Anesthesia Type: General Level of consciousness: awake Pain management: pain level controlled Vital Signs Assessment: post-procedure vital signs reviewed and stable Respiratory status: spontaneous breathing Cardiovascular status: stable Anesthetic complications: no    Last Vitals:  Vitals:   04/19/18 0800 04/19/18 0815  BP: 132/87 140/85  Pulse: 82 78  Resp: (!) 21 17  Temp:    SpO2: 98% 96%    Last Pain:  Vitals:   04/19/18 0815  TempSrc:   PainSc: 0-No pain                 Yaresly Menzel

## 2018-04-19 NOTE — Anesthesia Procedure Notes (Signed)
Procedure Name: LMA Insertion Date/Time: 04/19/2018 7:32 AM Performed by: Briant Sitesenenny, Radhika Dershem T, CRNA Pre-anesthesia Checklist: Patient identified, Emergency Drugs available, Suction available and Patient being monitored Patient Re-evaluated:Patient Re-evaluated prior to induction Oxygen Delivery Method: Circle system utilized Preoxygenation: Pre-oxygenation with 100% oxygen Induction Type: IV induction Ventilation: Mask ventilation without difficulty LMA: LMA with gastric port inserted LMA Size: 5.0 Number of attempts: 1 Airway Equipment and Method: Bite block Placement Confirmation: positive ETCO2 Tube secured with: Tape Dental Injury: Teeth and Oropharynx as per pre-operative assessment

## 2018-04-22 ENCOUNTER — Encounter (HOSPITAL_BASED_OUTPATIENT_CLINIC_OR_DEPARTMENT_OTHER): Payer: Self-pay | Admitting: Urology

## 2018-04-30 DIAGNOSIS — N183 Chronic kidney disease, stage 3 (moderate): Secondary | ICD-10-CM | POA: Diagnosis not present

## 2018-04-30 DIAGNOSIS — E1142 Type 2 diabetes mellitus with diabetic polyneuropathy: Secondary | ICD-10-CM | POA: Diagnosis not present

## 2018-04-30 DIAGNOSIS — E1122 Type 2 diabetes mellitus with diabetic chronic kidney disease: Secondary | ICD-10-CM | POA: Diagnosis not present

## 2018-05-01 DIAGNOSIS — N35011 Post-traumatic bulbous urethral stricture: Secondary | ICD-10-CM | POA: Diagnosis not present

## 2018-05-28 DIAGNOSIS — I129 Hypertensive chronic kidney disease with stage 1 through stage 4 chronic kidney disease, or unspecified chronic kidney disease: Secondary | ICD-10-CM | POA: Diagnosis not present

## 2018-05-28 DIAGNOSIS — N183 Chronic kidney disease, stage 3 (moderate): Secondary | ICD-10-CM | POA: Diagnosis not present

## 2018-05-28 DIAGNOSIS — E782 Mixed hyperlipidemia: Secondary | ICD-10-CM | POA: Diagnosis not present

## 2018-11-01 DIAGNOSIS — Z794 Long term (current) use of insulin: Secondary | ICD-10-CM | POA: Diagnosis not present

## 2018-11-01 DIAGNOSIS — E1165 Type 2 diabetes mellitus with hyperglycemia: Secondary | ICD-10-CM | POA: Diagnosis not present

## 2018-11-08 DIAGNOSIS — S3022XA Contusion of scrotum and testes, initial encounter: Secondary | ICD-10-CM | POA: Diagnosis not present

## 2018-11-21 DIAGNOSIS — E1142 Type 2 diabetes mellitus with diabetic polyneuropathy: Secondary | ICD-10-CM | POA: Diagnosis not present

## 2018-11-21 DIAGNOSIS — E1122 Type 2 diabetes mellitus with diabetic chronic kidney disease: Secondary | ICD-10-CM | POA: Diagnosis not present

## 2018-11-21 DIAGNOSIS — N183 Chronic kidney disease, stage 3 (moderate): Secondary | ICD-10-CM | POA: Diagnosis not present

## 2019-02-21 DIAGNOSIS — Z794 Long term (current) use of insulin: Secondary | ICD-10-CM | POA: Diagnosis not present

## 2019-02-21 DIAGNOSIS — E1142 Type 2 diabetes mellitus with diabetic polyneuropathy: Secondary | ICD-10-CM | POA: Diagnosis not present

## 2019-02-21 DIAGNOSIS — E1122 Type 2 diabetes mellitus with diabetic chronic kidney disease: Secondary | ICD-10-CM | POA: Diagnosis not present

## 2019-02-21 DIAGNOSIS — N183 Chronic kidney disease, stage 3 (moderate): Secondary | ICD-10-CM | POA: Diagnosis not present

## 2020-08-23 ENCOUNTER — Ambulatory Visit (INDEPENDENT_AMBULATORY_CARE_PROVIDER_SITE_OTHER): Payer: 59 | Admitting: Podiatry

## 2020-08-23 ENCOUNTER — Ambulatory Visit (INDEPENDENT_AMBULATORY_CARE_PROVIDER_SITE_OTHER): Payer: 59

## 2020-08-23 ENCOUNTER — Other Ambulatory Visit: Payer: Self-pay

## 2020-08-23 DIAGNOSIS — L02611 Cutaneous abscess of right foot: Secondary | ICD-10-CM | POA: Diagnosis not present

## 2020-08-23 DIAGNOSIS — L03031 Cellulitis of right toe: Secondary | ICD-10-CM | POA: Diagnosis not present

## 2020-08-23 DIAGNOSIS — L97519 Non-pressure chronic ulcer of other part of right foot with unspecified severity: Secondary | ICD-10-CM

## 2020-08-23 DIAGNOSIS — E1149 Type 2 diabetes mellitus with other diabetic neurological complication: Secondary | ICD-10-CM

## 2020-08-23 DIAGNOSIS — L97512 Non-pressure chronic ulcer of other part of right foot with fat layer exposed: Secondary | ICD-10-CM | POA: Diagnosis not present

## 2020-08-23 DIAGNOSIS — E11621 Type 2 diabetes mellitus with foot ulcer: Secondary | ICD-10-CM | POA: Diagnosis not present

## 2020-08-23 MED ORDER — SILVER SULFADIAZINE 1 % EX CREA: 1.0000 "application " | TOPICAL_CREAM | Freq: Every day | CUTANEOUS | 0 refills | Status: DC

## 2020-08-23 MED ORDER — CEPHALEXIN 500 MG PO CAPS: 500.0000 mg | ORAL_CAPSULE | Freq: Three times a day (TID) | ORAL | 0 refills | Status: DC

## 2020-08-23 NOTE — Patient Instructions (Signed)
Wash with soap and water daily. Dry thoroughly. Apply a small amount of silvadene to the wound daily.

## 2020-08-30 ENCOUNTER — Ambulatory Visit: Payer: 59 | Admitting: Podiatry

## 2020-08-31 NOTE — Progress Notes (Signed)
Subjective: 65 year old male presents the office today for concerns of ulceration to his right foot, blister which has now been ongoing for several weeks without any significant improvement.  He has noticed localized redness and swelling but denies any purulence but he does get drainage, clear drainage at times.  Currently denies any systemic concerns including fevers, chills, nausea, vomiting.  Denies any calf pain, chest pain, shortness of breath.  He recently saw his primary care physician but did not discuss or show his PCP his foot.  Last A1c 6.9  Objective: AAO x3, NAD DP/PT pulses palpable bilaterally, CRT less than 3 seconds Toes 1 is ulceration with granular wound base and there is surrounding edema and erythema along the first MPJ.  There is swelling and warmth to the foot but does not extend past the ankle.  There is no red streaking identified.  Ulceration submetatarsal 1 with a granular wound base and there is no probing, undermining or tunneling.  There is no fluctuation or crepitation. No other ulcerations identified. No pain with calf compression, swelling, warmth, erythema  Assessment: Right foot ulceration, cellulitis  Plan: -All treatment options discussed with the patient including all alternatives, risks, complications.  -X-rays obtained reviewed.  No evidence of acute fracture, osteomyelitis or soft tissue edema. -Today sharply debrided the wound without any complications or bleeding utilizing the 312 with scalpel down to healthy, viable tissue.  The wound is full-thickness.  Wound was cleaned and Silvadene was applied followed by dressing.  Silvadene was also ordered. -Keflex -Blood work ordered today including BMP, CBC.  I will also try to get letter from his primary care physician that he had done recently as well. -Discussed his any signs or symptoms of worsened infection he is to report directly to the emergency department. -Patient encouraged to call the office  with any questions, concerns, change in symptoms.   Vivi Barrack DPM

## 2020-09-08 ENCOUNTER — Telehealth: Payer: Self-pay | Admitting: Podiatry

## 2020-09-08 NOTE — Telephone Encounter (Signed)
Attempted to call patient as he did not show up for his appointment last week and see that he does not have a follow up scheduled. No answer but left VM to call back.

## 2020-11-02 ENCOUNTER — Other Ambulatory Visit: Payer: Self-pay

## 2020-11-02 ENCOUNTER — Ambulatory Visit: Payer: 59 | Admitting: Podiatry

## 2020-11-02 ENCOUNTER — Ambulatory Visit: Payer: Self-pay

## 2020-11-02 DIAGNOSIS — L02611 Cutaneous abscess of right foot: Secondary | ICD-10-CM

## 2020-11-02 DIAGNOSIS — L97512 Non-pressure chronic ulcer of other part of right foot with fat layer exposed: Secondary | ICD-10-CM

## 2020-11-02 DIAGNOSIS — E1149 Type 2 diabetes mellitus with other diabetic neurological complication: Secondary | ICD-10-CM | POA: Diagnosis not present

## 2020-11-02 DIAGNOSIS — L03031 Cellulitis of right toe: Secondary | ICD-10-CM

## 2020-11-02 MED ORDER — DOXYCYCLINE HYCLATE 100 MG PO TABS: 100.0000 mg | ORAL_TABLET | Freq: Two times a day (BID) | ORAL | 0 refills | Status: DC

## 2020-11-04 NOTE — Progress Notes (Signed)
Subjective: 64 year old male presents the office today for follow-up evaluation of a wound on his right foot.  He has not seen any increase in swelling or redness or any drainage or pus.  He is concerned that the wound is still present.  He did develop a rash on his leg over the last couple days which she was concerned about.  Denies any opening to this area.  Systemically he has no systemic concerns such as fevers, chills, nausea, vomiting. No acute changes since last appointment, and no other complaints at this time.   Objective: AAO x3, NAD DP/PT pulses palpable bilaterally, CRT less than 3 seconds On the plantar aspect of the right hallux is a ulceration with surrounding hyperkeratotic tissue.  After reviewing the wound measures 1 x 0.8 x 0.2 cm and there is no surrounding erythema, ascending cellulitis.  There is no fluctuance or crepitation but there is no malodor.  On the anterior aspect of the right leg there is a area of erythema with slight warmth.  There is no fluctuance or crepitation there is no drainage. No pain with calf compression, swelling, warmth, erythema  Assessment: 64 year old male right hallux ulceration, right leg cellulitis  Plan: -All treatment options discussed with the patient including all alternatives, risks, complications.  -Ulceration appears on the hallux appears to be not infected with concern about the area of redness on the leg.  This appeared Keflex. -Sharply debrided the ulceration status #312 with scalpel to any complications none healthy, bleeding tissue.  Clean the wound.  We will switch to Prsima daily -Offloading -Glucose control -Monitor for any clinical signs or symptoms of infection and directed to call the office immediately should any occur or go to the ER. -Patient encouraged to call the office with any questions, concerns, change in symptoms.   Vivi Barrack DPM

## 2020-11-25 ENCOUNTER — Ambulatory Visit: Payer: 59 | Admitting: Podiatry

## 2020-11-30 ENCOUNTER — Ambulatory Visit: Payer: 59 | Admitting: Podiatry

## 2021-05-16 ENCOUNTER — Encounter (HOSPITAL_BASED_OUTPATIENT_CLINIC_OR_DEPARTMENT_OTHER): Payer: 59 | Attending: Internal Medicine | Admitting: Internal Medicine

## 2021-05-16 ENCOUNTER — Other Ambulatory Visit: Payer: Self-pay

## 2021-05-16 DIAGNOSIS — L97519 Non-pressure chronic ulcer of other part of right foot with unspecified severity: Secondary | ICD-10-CM | POA: Insufficient documentation

## 2021-05-16 DIAGNOSIS — L97529 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: Secondary | ICD-10-CM | POA: Diagnosis not present

## 2021-05-16 DIAGNOSIS — E11621 Type 2 diabetes mellitus with foot ulcer: Secondary | ICD-10-CM | POA: Insufficient documentation

## 2021-05-16 DIAGNOSIS — Z951 Presence of aortocoronary bypass graft: Secondary | ICD-10-CM | POA: Diagnosis not present

## 2021-05-16 DIAGNOSIS — E1140 Type 2 diabetes mellitus with diabetic neuropathy, unspecified: Secondary | ICD-10-CM | POA: Insufficient documentation

## 2021-05-16 DIAGNOSIS — Z8249 Family history of ischemic heart disease and other diseases of the circulatory system: Secondary | ICD-10-CM | POA: Diagnosis not present

## 2021-05-16 DIAGNOSIS — Z794 Long term (current) use of insulin: Secondary | ICD-10-CM | POA: Insufficient documentation

## 2021-05-16 DIAGNOSIS — I2510 Atherosclerotic heart disease of native coronary artery without angina pectoris: Secondary | ICD-10-CM | POA: Insufficient documentation

## 2021-05-16 DIAGNOSIS — I10 Essential (primary) hypertension: Secondary | ICD-10-CM | POA: Diagnosis not present

## 2021-05-24 ENCOUNTER — Encounter (HOSPITAL_BASED_OUTPATIENT_CLINIC_OR_DEPARTMENT_OTHER): Payer: 59 | Attending: Internal Medicine | Admitting: Internal Medicine

## 2021-05-24 ENCOUNTER — Other Ambulatory Visit: Payer: Self-pay

## 2021-05-24 DIAGNOSIS — E1151 Type 2 diabetes mellitus with diabetic peripheral angiopathy without gangrene: Secondary | ICD-10-CM | POA: Insufficient documentation

## 2021-05-24 DIAGNOSIS — E1140 Type 2 diabetes mellitus with diabetic neuropathy, unspecified: Secondary | ICD-10-CM | POA: Insufficient documentation

## 2021-05-24 DIAGNOSIS — I2510 Atherosclerotic heart disease of native coronary artery without angina pectoris: Secondary | ICD-10-CM | POA: Diagnosis not present

## 2021-05-24 DIAGNOSIS — Z951 Presence of aortocoronary bypass graft: Secondary | ICD-10-CM | POA: Insufficient documentation

## 2021-05-24 DIAGNOSIS — Z8249 Family history of ischemic heart disease and other diseases of the circulatory system: Secondary | ICD-10-CM | POA: Insufficient documentation

## 2021-05-24 DIAGNOSIS — I10 Essential (primary) hypertension: Secondary | ICD-10-CM | POA: Insufficient documentation

## 2021-05-24 DIAGNOSIS — L97519 Non-pressure chronic ulcer of other part of right foot with unspecified severity: Secondary | ICD-10-CM | POA: Diagnosis not present

## 2021-05-24 DIAGNOSIS — E11621 Type 2 diabetes mellitus with foot ulcer: Secondary | ICD-10-CM | POA: Insufficient documentation

## 2021-05-24 DIAGNOSIS — L97529 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: Secondary | ICD-10-CM

## 2021-05-24 DIAGNOSIS — G4730 Sleep apnea, unspecified: Secondary | ICD-10-CM | POA: Insufficient documentation

## 2021-05-24 DIAGNOSIS — Z794 Long term (current) use of insulin: Secondary | ICD-10-CM | POA: Insufficient documentation

## 2021-05-24 NOTE — Progress Notes (Signed)
[name] [name] ([id number]) Visit Report for 05/24/2021 Arrival Information Details Patient Name: Date of Service: [name] [name], [name] [name] 05/24/2021 8:45 A M Medical Record Number: [id number] Patient Account Number: [id number] Date of Birth/Sex: Treating RN: November 16, 1956 (65 y.o. [name] [name] Primary Care Bevan Vu: Tally Joe Other Clinician: Referring Yanni Ruberg: Treating Asmar Brozek/Extender: Andrey Campanile in Treatment: 1 Visit Information History Since Last Visit Added or deleted any medications: No Patient Arrived: Ambulatory Any new allergies or adverse reactions: No Arrival Time: 08:15 Had a fall or experienced change in No Accompanied By: self activities of daily living that may affect Transfer Assistance: None risk of falls: Patient Identification Verified: Yes Signs or symptoms of abuse/neglect since last visito No Secondary Verification Process Completed: Yes Hospitalized since last visit: No Patient Requires Transmission-Based Precautions: No Implantable device outside of the clinic excluding No Patient Has Alerts: No cellular tissue based products placed in the center since last visit: Has Dressing in Place as Prescribed: Yes Pain Present Now: No Electronic Signature(s) Signed: 05/24/2021 3:40:40 PM By: Karl Ito Entered By: Karl Ito on 05/24/2021 08:15:29 -------------------------------------------------------------------------------- Encounter Discharge Information Details Patient Name: Date of Service: [name] [name] 05/24/2021 8:45 A M Medical Record Number: [id number] Patient Account Number: [id number] Date of Birth/Sex: Treating RN: [birth year] (65 y.o. [name] Primary Care Calianna Kim: Tally Joe Other Clinician: Referring Rada Zegers: Treating Lethia Donlon/Extender: Andrey Campanile in Treatment: 1 Encounter Discharge Information Items Post Procedure Vitals Discharge Condition: Stable Temperature (F):  97.6 Ambulatory Status: Ambulatory Pulse (bpm): 77 Discharge Destination: Home Respiratory Rate (breaths/min): 20 Transportation: Private Auto Blood Pressure (mmHg): 183/101 Accompanied By: self Schedule Follow-up Appointment: Yes Clinical Summary of Care: Electronic Signature(s) Signed: 05/24/2021 6:55:28 PM By: Shawn Stall Entered By: Shawn Stall on 05/24/2021 09:16:43 -------------------------------------------------------------------------------- Lower Extremity Assessment Details Patient Name: Date of Service: [name] [name], [name] [name] 05/24/2021 8:45 A M Medical Record Number: [id number] Patient Account Number: [id number] Date of Birth/Sex: Treating RN: 1956/06/17 (65 y.o. [name] Primary Care Icela Glymph: Tally Joe Other Clinician: Referring Leafy Motsinger: Treating Estle Sabella/Extender: Andrey Campanile in Treatment: 1 Edema Assessment Assessed: Kyra Searles: No] Franne Forts: No] E[Left: dema] [Right: :] Calf Left: Right: Point of Measurement: 34 cm From Medial Instep 48 cm 44 cm Ankle Left: Right: Point of Measurement: 10 cm From Medial Instep 23.8 cm 24 cm Vascular Assessment Pulses: Dorsalis Pedis Palpable: [Left:Yes] [Right:Yes] Electronic Signature(s) Signed: 05/24/2021 7:10:16 PM By: Zenaida Deed RN, BSN Entered By: Zenaida Deed on 05/24/2021 08:38:51 -------------------------------------------------------------------------------- Multi Wound Chart Details Patient Name: Date of Service: [name] [name] 05/24/2021 8:45 A M Medical Record Number: [id number] Patient Account Number: [id number] Date of Birth/Sex: Treating RN: 1956-08-15 (65 y.o. [name] [name] Primary Care Inetta Dicke: Tally Joe Other Clinician: Referring Maysel Mccolm: Treating Skeeter Sheard/Extender: Andrey Campanile in Treatment: 1 Vital Signs Height(in): 72 Pulse(bpm): 77 Weight(lbs): 271 Blood Pressure(mmHg): 183/101 Body Mass Index(BMI):  37 Temperature(F): 97.6 Respiratory Rate(breaths/min): 16 Photos: [2:No Photos Left T Great oe] [3:No Photos Right T Great oe] [4:No Photos Right T Second oe] Wound Location: [2:Gradually Appeared] [3:Gradually Appeared] [4:Gradually Appeared] Wounding Event: [2:Diabetic Wound/Ulcer of the Lower] [3:Diabetic Wound/Ulcer of the Lower] [4:Diabetic Wound/Ulcer of the Lower] Primary Etiology: [2:Extremity Sleep Apnea, Coronary Artery] [3:Extremity Sleep Apnea, Coronary Artery] [4:Extremity Sleep Apnea, Coronary Artery] Comorbid History: [2:Disease, Hypertension, Type II Diabetes, Osteoarthritis, Neuropathy, Confinement Anxiety 03/20/2021] [3:Disease, Hypertension, Type II Diabetes, Osteoarthritis, Neuropathy, Confinement Anxiety 07/21/2020] [4:Disease, Hypertension, Type  II Diabetes, Osteoarthritis, Neuropathy, Confinement Anxiety 05/02/2021]  Date Acquired: [2:1] [3:1] [4:1] Weeks of Treatment: [2:Open] [3:Open] [4:Open] Wound Status: [2:0.8x1x0.3] [3:1.2x0.5x0.2] [4:0.3x0.4x0.2] Measurements L x W x D (cm) [2:0.628] [3:0.471] [4:0.094] A (cm) : rea [2:0.188] [3:0.094] [4:0.019] Volume (cm) : [2:59.00%] [3:55.60%] [4:52.00%] % Reduction in A rea: [2:38.60%] [3:77.80%] [4:51.30%] % Reduction in Volume: [4:9] Starting Position 1 (o'clock): [4:12] Ending Position 1 (o'clock): [4:0.2] Maximum Distance 1 (cm): [2:No] [3:No] [4:Yes] Undermining: [2:Grade 1] [3:Grade 1] [4:Grade 1] Classification: [2:Medium] [3:Medium] [4:Medium] Exudate A mount: [2:Serosanguineous] [3:Serosanguineous] [4:Serosanguineous] Exudate Type: [2:red, brown] [3:red, brown] [4:red, brown] Exudate Color: [2:Thickened] [3:Thickened] [4:Distinct, outline attached] Wound Margin: [2:Large (67-100%)] [3:Large (67-100%)] [4:Large (67-100%)] Granulation A mount: [2:Red, Pink] [3:Red] [4:Red, Pink] Granulation Quality: [2:Small (1-33%)] [3:Small (1-33%)] [4:None Present (0%)] Necrotic A mount: [2:Fat Layer (Subcutaneous Tissue):  Yes Fat Layer (Subcutaneous Tissue): Yes Fat Layer (Subcutaneous Tissue): Yes] Exposed Structures: [2:Fascia: No Tendon: No Muscle: No Joint: No Bone: No None] [3:Fascia: No Tendon: No Muscle: No Joint: No Bone: No None] [4:Fascia: No Tendon: No Muscle: No Joint: No Bone: No None] Epithelialization: [2:Debridement - Excisional] [3:Debridement - Excisional] [4:N/A] Debridement: Pre-procedure Verification/Time Out 08:50 [3:08:50] [4:N/A] Taken: [2:Lidocaine 4% T opical Solution] [3:Lidocaine 4% T opical Solution] [4:N/A] Pain Control: [2:Callus, Subcutaneous, Slough] [3:Callus, Subcutaneous, Slough] [4:N/A] Tissue Debrided: [2:Skin/Subcutaneous Tissue] [3:Skin/Subcutaneous Tissue] [4:N/A] Level: [2:1.2] [3:0.6] [4:N/A] Debridement A (sq cm): [2:rea Blade, Curette] [3:Blade, Curette] [4:N/A] Instrument: [2:Minimum] [3:Minimum] [4:N/A] Bleeding: [2:Pressure] [3:Pressure] [4:N/A] Hemostasis A chieved: [2:0] [3:0] [4:N/A] Procedural Pain: [2:0] [3:0] [4:N/A] Post Procedural Pain: [2:Procedure was tolerated well] [3:Procedure was tolerated well] [4:N/A] Debridement Treatment Response: [2:0.8x1x0.3] [3:1.2x0.5x0.2] [4:N/A] Post Debridement Measurements L x W x D (cm) [2:0.188] [3:0.094] [4:N/A] Post Debridement Volume: (cm) [2:Debridement] [3:Debridement] [4:N/A] Treatment Notes Electronic Signature(s) Signed: 05/24/2021 9:17:47 AM By: Geralyn CorwinHoffman, Jessica DO Signed: 05/24/2021 6:55:28 PM By: Shawn Stalleaton, Bobbi Entered By: Geralyn CorwinHoffman, Jessica on 05/24/2021 09:13:02 -------------------------------------------------------------------------------- Multi-Disciplinary Care Plan Details Patient Name: Date of Service: [name] HaltUSH, WILLIA M C. 05/24/2021 8:45 A M Medical Record Number: [id number] Patient Account Number: [id number] Date of Birth/Sex: Treating RN: [birth year] (65 y.o. [name] FlorM) Deaton, Millard.LoaBobbi Primary Care [employer]: Tally JoeSwayne, David Other Clinician: Referring Suad Autrey: Treating Jeanene Mena/Extender: Andrey CampanileHoffman,  Jessica Swayne, David Weeks in Treatment: 1 Active Inactive Nutrition Nursing Diagnoses: Impaired glucose control: actual or potential Goals: Patient/caregiver verbalizes understanding of need to maintain therapeutic glucose control per primary care physician Date Initiated: 05/16/2021 Target Resolution Date: 06/13/2021 Goal Status: Active Interventions: Provide education on elevated blood sugars and impact on wound healing Provide education on nutrition Notes: Wound/Skin Impairment Nursing Diagnoses: Impaired tissue integrity Goals: Patient/caregiver will verbalize understanding of skin care regimen Date Initiated: 05/16/2021 Target Resolution Date: 06/13/2021 Goal Status: Active Ulcer/skin breakdown will have a volume reduction of 30% by week 4 Date Initiated: 05/16/2021 Target Resolution Date: 06/13/2021 Goal Status: Active Interventions: Assess patient/caregiver ability to obtain necessary supplies Assess patient/caregiver ability to perform ulcer/skin care regimen upon admission and as needed Assess ulceration(s) every visit Provide education on ulcer and skin care Treatment Activities: Skin care regimen initiated : 05/16/2021 Topical wound management initiated : 05/16/2021 Notes: Electronic Signature(s) Signed: 05/24/2021 6:55:28 PM By: Shawn Stalleaton, Bobbi Entered By: Shawn Stalleaton, Bobbi on 05/24/2021 08:50:40 -------------------------------------------------------------------------------- Pain Assessment Details Patient Name: Date of Service: [name] HaltRUSH, WILLIA M C. 05/24/2021 8:45 A M Medical Record Number: [id number] Patient Account Number: [id number] Date of Birth/Sex: Treating RN: [birth year] (65 y.o. Tammy SoursM) Deaton, Bobbi Primary Care Treysen Sudbeck: Tally JoeSwayne, David Other Clinician: Referring Mike Berntsen: Treating Kamrin Spath/Extender: Andrey CampanileHoffman, Jessica Swayne, David Weeks in Treatment: 1 Active Problems Location of Pain  Severity and Description of Pain Patient Has Paino No Site Locations Rate the  pain. Rate the pain. Current Pain Level: 0 Pain Management and Medication Current Pain Management: Electronic Signature(s) Signed: 05/24/2021 6:55:28 PM By: Shawn Stall Signed: 05/24/2021 7:10:16 PM By: Zenaida Deed RN, BSN Entered By: Zenaida Deed on 05/24/2021 08:37:39 -------------------------------------------------------------------------------- Patient/Caregiver Education Details Patient Name: Date of Service: [name] [name] 7/5/2022andnbsp8:45 A M Medical Record Number: [id number] Patient Account Number: [id number] Date of Birth/Gender: Treating RN: 1956/05/24 (65 y.o. [name] Primary Care Physician: Tally Joe Other Clinician: Referring Physician: Treating Physician/Extender: Andrey Campanile in Treatment: 1 Education Assessment Education Provided To: Patient Education Topics Provided Elevated Blood Sugar/ Impact on Healing: Handouts: Elevated Blood Sugars: How Do They Affect Wound Healing Methods: Explain/Verbal Responses: Reinforcements needed Electronic Signature(s) Signed: 05/24/2021 6:55:28 PM By: Shawn Stall Entered By: Shawn Stall on 05/24/2021 08:50:51 -------------------------------------------------------------------------------- Wound Assessment Details Patient Name: Date of Service: [name] [name], [name] [name] 05/24/2021 8:45 A M Medical Record Number: [id number] Patient Account Number: [id number] Date of Birth/Sex: Treating RN: 1956-09-12 (65 y.o. [name] [name] Primary Care Rithik Odea: Tally Joe Other Clinician: Referring Apryll Hinkle: Treating Yula Crotwell/Extender: Andrey Campanile in Treatment: 1 Wound Status Wound Number: 2 Primary Diabetic Wound/Ulcer of the Lower Extremity Etiology: Wound Location: Left T Great oe Wound Open Wounding Event: Gradually Appeared Status: Date Acquired: 03/20/2021 Comorbid Sleep Apnea, Coronary Artery Disease, Hypertension, Type II Weeks Of Treatment:  1 History: Diabetes, Osteoarthritis, Neuropathy, Confinement Anxiety Clustered Wound: No Photos Wound Measurements Length: (cm) 0.8 Width: (cm) 1 Depth: (cm) 0.3 Area: (cm) 0.628 Volume: (cm) 0.188 % Reduction in Area: 59% % Reduction in Volume: 38.6% Epithelialization: None Tunneling: No Undermining: No Wound Description Classification: Grade 1 Wound Margin: Thickened Exudate Amount: Medium Exudate Type: Serosanguineous Exudate Color: red, brown Foul Odor After Cleansing: No Slough/Fibrino No Wound Bed Granulation Amount: Large (67-100%) Exposed Structure Granulation Quality: Red, Pink Fascia Exposed: No Necrotic Amount: Small (1-33%) Fat Layer (Subcutaneous Tissue) Exposed: Yes Necrotic Quality: Adherent Slough Tendon Exposed: No Muscle Exposed: No Joint Exposed: No Bone Exposed: No Treatment Notes Wound #2 (Toe Great) Wound Laterality: Left Cleanser Soap and Water Discharge Instruction: May shower and wash wound with dial antibacterial soap and water prior to dressing change. Peri-Wound Care Topical Primary Dressing KerraCel Ag Gelling Fiber Dressing, 2x2 in (silver alginate) Discharge Instruction: Apply silver alginate to wound bed as instructed Secondary Dressing Woven Gauze Sponges 2x2 in Discharge Instruction: Apply over primary dressing as directed. Optifoam Non-Adhesive Dressing, 4x4 in Discharge Instruction: Apply as donut over dressing Secured With Conforming Stretch Gauze Bandage, Sterile 2x75 (in/in) Discharge Instruction: Secure with stretch gauze as directed. Transpore Surgical Tape, 2x10 (in/yd) Discharge Instruction: Secure dressing with tape as directed. Compression Wrap Compression Stockings Add-Ons Electronic Signature(s) Signed: 05/24/2021 5:48:24 PM By: Karl Ito Signed: 05/24/2021 6:55:28 PM By: Shawn Stall Entered By: Karl Ito on 05/24/2021  17:17:29 -------------------------------------------------------------------------------- Wound Assessment Details Patient Name: Date of Service: [name] [name] 05/24/2021 8:45 A M Medical Record Number: [id number] Patient Account Number: [id number] Date of Birth/Sex: Treating RN: [birth year] (65 y.o. [name] Primary Care Johnjoseph Rolfe: Tally Joe Other Clinician: Referring Vale Peraza: Treating Aasiyah Auerbach/Extender: Andrey Campanile in Treatment: 1 Wound Status Wound Number: 3 Primary Diabetic Wound/Ulcer of the Lower Extremity Etiology: Wound Location: Right T Great oe Wound Open Wounding Event: Gradually Appeared Status: Date Acquired: 07/21/2020 Comorbid Sleep Apnea, Coronary Artery Disease, Hypertension, Type II Weeks Of Treatment: 1 History: Diabetes,  Osteoarthritis, Neuropathy, Confinement Anxiety Clustered Wound: No Photos Wound Measurements Length: (cm) 1.2 Width: (cm) 0.5 Depth: (cm) 0.2 Area: (cm) 0.471 Volume: (cm) 0.094 % Reduction in Area: 55.6% % Reduction in Volume: 77.8% Epithelialization: None Tunneling: No Undermining: No Wound Description Classification: Grade 1 Wound Margin: Thickened Exudate Amount: Medium Exudate Type: Serosanguineous Exudate Color: red, brown Foul Odor After Cleansing: No Slough/Fibrino No Wound Bed Granulation Amount: Large (67-100%) Exposed Structure Granulation Quality: Red Fascia Exposed: No Necrotic Amount: Small (1-33%) Fat Layer (Subcutaneous Tissue) Exposed: Yes Necrotic Quality: Adherent Slough Tendon Exposed: No Muscle Exposed: No Joint Exposed: No Bone Exposed: No Treatment Notes Wound #3 (Toe Great) Wound Laterality: Right Cleanser Soap and Water Discharge Instruction: May shower and wash wound with dial antibacterial soap and water prior to dressing change. Peri-Wound Care Topical Primary Dressing KerraCel Ag Gelling Fiber Dressing, 2x2 in (silver alginate) Discharge  Instruction: Apply silver alginate to wound bed as instructed Secondary Dressing Woven Gauze Sponges 2x2 in Discharge Instruction: Apply over primary dressing as directed. Optifoam Non-Adhesive Dressing, 4x4 in Discharge Instruction: Apply as donut over dressing Secured With Conforming Stretch Gauze Bandage, Sterile 2x75 (in/in) Discharge Instruction: Secure with stretch gauze as directed. Transpore Surgical Tape, 2x10 (in/yd) Discharge Instruction: Secure dressing with tape as directed. Compression Wrap Compression Stockings Add-Ons Electronic Signature(s) Signed: 05/24/2021 5:48:24 PM By: Karl Ito Signed: 05/24/2021 6:55:28 PM By: Shawn Stall Entered By: Karl Ito on 05/24/2021 17:16:42 -------------------------------------------------------------------------------- Wound Assessment Details Patient Name: Date of Service: [name] [name] 05/24/2021 8:45 A M Medical Record Number: [id number] Patient Account Number: [id number] Date of Birth/Sex: Treating RN: 08/07/1956 (65 y.o. [name] [name] Primary Care Vear Staton: Tally Joe Other Clinician: Referring Caleigha Zale: Treating Korie Brabson/Extender: Andrey Campanile in Treatment: 1 Wound Status Wound Number: 4 Primary Diabetic Wound/Ulcer of the Lower Extremity Etiology: Wound Location: Right T Second oe Wound Open Wounding Event: Gradually Appeared Status: Date Acquired: 05/02/2021 Comorbid Sleep Apnea, Coronary Artery Disease, Hypertension, Type II Weeks Of Treatment: 1 History: Diabetes, Osteoarthritis, Neuropathy, Confinement Anxiety Clustered Wound: No Photos Wound Measurements Length: (cm) 0.3 Width: (cm) 0.4 Depth: (cm) 0.2 Area: (cm) 0.094 Volume: (cm) 0.019 % Reduction in Area: 52% % Reduction in Volume: 51.3% Epithelialization: None Tunneling: No Undermining: Yes Starting Position (o'clock): 9 Ending Position (o'clock): 12 Maximum Distance: (cm) 0.2 Wound  Description Classification: Grade 1 Wound Margin: Distinct, outline attached Exudate Amount: Medium Exudate Type: Serosanguineous Exudate Color: red, brown Foul Odor After Cleansing: No Slough/Fibrino No Wound Bed Granulation Amount: Large (67-100%) Exposed Structure Granulation Quality: Red, Pink Fascia Exposed: No Necrotic Amount: None Present (0%) Fat Layer (Subcutaneous Tissue) Exposed: Yes Tendon Exposed: No Muscle Exposed: No Joint Exposed: No Bone Exposed: No Treatment Notes Wound #4 (Toe Second) Wound Laterality: Right Cleanser Soap and Water Discharge Instruction: May shower and wash wound with dial antibacterial soap and water prior to dressing change. Peri-Wound Care Topical Primary Dressing KerraCel Ag Gelling Fiber Dressing, 2x2 in (silver alginate) Discharge Instruction: Apply silver alginate to wound bed as instructed Secondary Dressing Woven Gauze Sponges 2x2 in Discharge Instruction: Apply over primary dressing as directed. Optifoam Non-Adhesive Dressing, 4x4 in Discharge Instruction: Apply as donut over dressing Secured With Conforming Stretch Gauze Bandage, Sterile 2x75 (in/in) Discharge Instruction: Secure with stretch gauze as directed. Transpore Surgical Tape, 2x10 (in/yd) Discharge Instruction: Secure dressing with tape as directed. Compression Wrap Compression Stockings Add-Ons Electronic Signature(s) Signed: 05/24/2021 5:48:24 PM By: Karl Ito Signed: 05/24/2021 6:55:28 PM By: Shawn Stall Entered By: Karl Ito  on 05/24/2021 17:15:08 -------------------------------------------------------------------------------- Vitals Details Patient Name: Date of Service: [name] [name], [name] [name] 05/24/2021 8:45 A M Medical Record Number: [id number] Patient Account Number: [id number] Date of Birth/Sex: Treating RN: [birth year] (65 y.o. [name] [name] Primary Care Edward Trevino: Tally Joe Other Clinician: Referring Tre Sanker: Treating  Yanel Dombrosky/Extender: Andrey Campanile in Treatment: 1 Vital Signs Time Taken: 08:15 Temperature (F): 97.6 Height (in): 72 Pulse (bpm): 77 Weight (lbs): 271 Respiratory Rate (breaths/min): 16 Body Mass Index (BMI): 36.8 Blood Pressure (mmHg): 183/101 Reference Range: 80 - 120 mg / dl Electronic Signature(s) Signed: 05/24/2021 3:40:40 PM By: Karl Ito Entered By: Karl Ito on 05/24/2021 08:15:47

## 2021-05-24 NOTE — Progress Notes (Signed)
[name], [name] ([id number]) Visit Report for 05/24/2021 Chief Complaint Document Details Patient Name: Date of Service: [name] [name], [name] [name] 05/24/2021 8:45 A M Medical Record Number: [id number] Patient Account Number: [id number] Date of Birth/Sex: Treating RN: [birth year] (65 y.o. [name] Primary Care Provider: Tally Joe Other Clinician: Referring Provider: Treating Provider/Extender: Andrey Campanile in Treatment: 1 Information Obtained from: Patient Chief Complaint Bilateral toe wounds Electronic Signature(s) Signed: 05/24/2021 9:17:47 AM By: Geralyn Corwin DO Entered By: Geralyn Corwin on 05/24/2021 09:13:10 -------------------------------------------------------------------------------- Debridement Details Patient Name: Date of Service: [name] [name] 05/24/2021 8:45 A M Medical Record Number: [id number] Patient Account Number: [id number] Date of Birth/Sex: Treating RN: Apr 06, 1956 (65 y.o. [name], [name] Primary Care Provider: Tally Joe Other Clinician: Referring Provider: Treating Provider/Extender: Andrey Campanile in Treatment: 1 Debridement Performed for Assessment: Wound #2 Left T Great oe Performed By: Physician Geralyn Corwin, DO Debridement Type: Debridement Severity of Tissue Pre Debridement: Fat layer exposed Level of Consciousness (Pre-procedure): Awake and Alert Pre-procedure Verification/Time Out Yes - 08:50 Taken: Start Time: 08:51 Pain Control: Lidocaine 4% T opical Solution T Area Debrided (L x W): otal 1 (cm) x 1.2 (cm) = 1.2 (cm) Tissue and other material debrided: Viable, Non-Viable, Callus, Slough, Subcutaneous, Skin: Dermis , Skin: Epidermis, Fibrin/Exudate, Slough Level: Skin/Subcutaneous Tissue Debridement Description: Excisional Instrument: Blade, Curette Bleeding: Minimum Hemostasis Achieved: Pressure End Time: 08:58 Procedural Pain: 0 Post Procedural Pain: 0 Response to Treatment:  Procedure was tolerated well Level of Consciousness (Post- Awake and Alert procedure): Post Debridement Measurements of Total Wound Length: (cm) 0.8 Width: (cm) 1 Depth: (cm) 0.3 Volume: (cm) 0.188 Character of Wound/Ulcer Post Debridement: Improved Severity of Tissue Post Debridement: Fat layer exposed Post Procedure Diagnosis Same as Pre-procedure Electronic Signature(s) Signed: 05/24/2021 9:17:47 AM By: Geralyn Corwin DO Signed: 05/24/2021 6:55:28 PM By: Shawn Stall Entered By: Shawn Stall on 05/24/2021 09:00:14 -------------------------------------------------------------------------------- Debridement Details Patient Name: Date of Service: [name] [name] 05/24/2021 8:45 A M Medical Record Number: [id number] Patient Account Number: [id number] Date of Birth/Sex: Treating RN: [birth year] (65 y.o. [name], [name] Primary Care Provider: Tally Joe Other Clinician: Referring Provider: Treating Provider/Extender: Andrey Campanile in Treatment: 1 Debridement Performed for Assessment: Wound #3 Right T Great oe Performed By: Physician Geralyn Corwin, DO Debridement Type: Debridement Severity of Tissue Pre Debridement: Fat layer exposed Level of Consciousness (Pre-procedure): Awake and Alert Pre-procedure Verification/Time Out Yes - 08:50 Taken: Start Time: 08:51 Pain Control: Lidocaine 4% T opical Solution T Area Debrided (L x W): otal 1.2 (cm) x 0.5 (cm) = 0.6 (cm) Tissue and other material debrided: Viable, Non-Viable, Callus, Slough, Subcutaneous, Skin: Dermis , Skin: Epidermis, Fibrin/Exudate, Slough Level: Skin/Subcutaneous Tissue Debridement Description: Excisional Instrument: Blade, Curette Bleeding: Minimum Hemostasis Achieved: Pressure End Time: 08:58 Procedural Pain: 0 Post Procedural Pain: 0 Response to Treatment: Procedure was tolerated well Level of Consciousness (Post- Awake and Alert procedure): Post Debridement  Measurements of Total Wound Length: (cm) 1.2 Width: (cm) 0.5 Depth: (cm) 0.2 Volume: (cm) 0.094 Character of Wound/Ulcer Post Debridement: Improved Severity of Tissue Post Debridement: Fat layer exposed Post Procedure Diagnosis Same as Pre-procedure Electronic Signature(s) Signed: 05/24/2021 9:17:47 AM By: Geralyn Corwin DO Signed: 05/24/2021 6:55:28 PM By: Shawn Stall Entered By: Shawn Stall on 05/24/2021 09:00:44 -------------------------------------------------------------------------------- HPI Details Patient Name: Date of Service: [name] [name] 05/24/2021 8:45 A M Medical Record Number: [id number] Patient Account Number: [id number] Date of Birth/Sex: Treating RN: August 26, 1956 (65 y.o. M)  Shawn Stalleaton, Bobbi Primary Care Provider: Tally JoeSwayne, David Other Clinician: Referring Provider: Treating Provider/Extender: Andrey CampanileHoffman, Salli Bodin Swayne, David Weeks in Treatment: 1 History of Present Illness HPI Description: Admission 6/27 [name] is a 65 year old male with a past medical history of insulin-dependent type 2 diabetes, CABG in 2011 and essential hypertension that presents to the clinic for bilateral toe wounds. He has wounds to his left great toe that started in May 2022. He also has wounds to his right great toe and second toe that started in September 2021. He noticed they started as blisters and eventually worsened. He is currently using antibiotic ointment and keeping them covered. He reports minimal pain but has decreased sensation to his feet bilaterally. He denies signs of infection. 7/5; patient presents for 1 week follow-up. He did not receive supplies from our wound supplier [facility]. He ordered some silver alginate on [employer] and has been using this to the wound beds. He has also been aggressively offloading the areas. Overall he reports doing well and would like to hold off on the total contact cast since there has been improvement to the wound beds. He currently denies  signs of infection. Electronic Signature(s) Signed: 05/24/2021 9:17:47 AM By: Geralyn CorwinHoffman, Molina Hollenback DO Entered By: Geralyn CorwinHoffman, Tadd Holtmeyer on 05/24/2021 09:14:13 -------------------------------------------------------------------------------- Physical Exam Details Patient Name: Date of Service: [name] HaltRUSH, WILLIA M C. 05/24/2021 8:45 A M Medical Record Number: [id number] Patient Account Number: [id number] Date of Birth/Sex: Treating RN: 01-31-1956 (65 y.o. Tammy SoursM) Deaton, Bobbi Primary Care Provider: Tally JoeSwayne, David Other Clinician: Referring Provider: Treating Provider/Extender: Andrey CampanileHoffman, Imara Standiford Swayne, David Weeks in Treatment: 1 Constitutional respirations regular, non-labored and within target range for patient.. Cardiovascular 2+ dorsalis pedis/posterior tibialis pulses. Psychiatric pleasant and cooperative. Notes Right foot: Great toe and second toe have open wounds with nonviable tissue, callus and granulation tissue present no signs of infection. Left foot: Great toe has an open wound with nonviable tissue callus and granulation tissue present. No signs of infection. Electronic Signature(s) Signed: 05/24/2021 9:17:47 AM By: Geralyn CorwinHoffman, Carin Shipp DO Entered By: Geralyn CorwinHoffman, Dermot Gremillion on 05/24/2021 09:15:08 -------------------------------------------------------------------------------- Physician Orders Details Patient Name: Date of Service: [name] HaltUSH, WILLIA M C. 05/24/2021 8:45 A M Medical Record Number: [id number] Patient Account Number: [id number] Date of Birth/Sex: Treating RN: 01-31-1956 (65 y.o. Harlon FlorM) Deaton, Millard.LoaBobbi Primary Care Provider: Tally JoeSwayne, David Other Clinician: Referring Provider: Treating Provider/Extender: Andrey CampanileHoffman, Nabilah Davoli Swayne, David Weeks in Treatment: 1 Verbal / Phone Orders: No Diagnosis Coding ICD-10 Coding Code Description [phone] 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 L97.519 Non-pressure chronic ulcer of other part of right foot with unspecified  severity E11.621 Type 2 diabetes mellitus with foot ulcer E11.40 Type 2 diabetes mellitus with diabetic neuropathy, unspecified Follow-up Appointments ppointment in 1 week. - with Dr. Mikey BussingHoffman Monday Return A Other: - [facility] [employer] for Supplies Bathing/ Shower/ Hygiene May shower and wash wound with soap and water. Edema Control - Lymphedema / SCD / Other Avoid standing for long periods of time. Off-Loading Other: - -Offloading Shoe to Right Foot -Reduce as much pressure as possible to both feet, decrease work hours if possible. -Consider using T Contact Cast next week. otal Additional Orders / Instructions Follow Nutritious Diet - -Increase protein, 100-120g daily -Keep blood sugars monitored Wound Treatment Wound #2 - T Great oe Wound Laterality: Left Cleanser: Soap and Water 1 x Per Day/15 Days Discharge Instructions: May shower and wash wound with dial antibacterial soap and water prior to dressing change. Prim Dressing: KerraCel Ag Gelling Fiber Dressing, 2x2 in (  silver alginate) 1 x Per Day/15 Days ary Discharge Instructions: Apply silver alginate to wound bed as instructed Secondary Dressing: Woven Gauze Sponges 2x2 in 1 x Per Day/15 Days Discharge Instructions: Apply over primary dressing as directed. Secondary Dressing: Optifoam Non-Adhesive Dressing, 4x4 in 1 x Per Day/15 Days Discharge Instructions: Apply as donut over dressing Secured With: Conforming Stretch Gauze Bandage, Sterile 2x75 (in/in) 1 x Per Day/15 Days Discharge Instructions: Secure with stretch gauze as directed. Secured With: Transpore Surgical Tape, 2x10 (in/yd) 1 x Per Day/15 Days Discharge Instructions: Secure dressing with tape as directed. Wound #3 - T Great oe Wound Laterality: Right Cleanser: Soap and Water 1 x Per Day/15 Days Discharge Instructions: May shower and wash wound with dial antibacterial soap and water prior to dressing change. Prim Dressing: KerraCel Ag Gelling Fiber Dressing, 2x2 in  (silver alginate) 1 x Per Day/15 Days ary Discharge Instructions: Apply silver alginate to wound bed as instructed Secondary Dressing: Woven Gauze Sponges 2x2 in 1 x Per Day/15 Days Discharge Instructions: Apply over primary dressing as directed. Secondary Dressing: Optifoam Non-Adhesive Dressing, 4x4 in 1 x Per Day/15 Days Discharge Instructions: Apply as donut over dressing Secured With: Conforming Stretch Gauze Bandage, Sterile 2x75 (in/in) 1 x Per Day/15 Days Discharge Instructions: Secure with stretch gauze as directed. Secured With: Transpore Surgical Tape, 2x10 (in/yd) 1 x Per Day/15 Days Discharge Instructions: Secure dressing with tape as directed. Wound #4 - T Second oe Wound Laterality: Right Cleanser: Soap and Water 1 x Per Day/15 Days Discharge Instructions: May shower and wash wound with dial antibacterial soap and water prior to dressing change. Prim Dressing: KerraCel Ag Gelling Fiber Dressing, 2x2 in (silver alginate) ary 1 x Per Day/15 Days Discharge Instructions: Apply silver alginate to wound bed as instructed Secondary Dressing: Woven Gauze Sponges 2x2 in 1 x Per Day/15 Days Discharge Instructions: Apply over primary dressing as directed. Secondary Dressing: Optifoam Non-Adhesive Dressing, 4x4 in 1 x Per Day/15 Days Discharge Instructions: Apply as donut over dressing Secured With: Conforming Stretch Gauze Bandage, Sterile 2x75 (in/in) 1 x Per Day/15 Days Discharge Instructions: Secure with stretch gauze as directed. Secured With: Transpore Surgical Tape, 2x10 (in/yd) 1 x Per Day/15 Days Discharge Instructions: Secure dressing with tape as directed. Electronic Signature(s) Signed: 05/24/2021 9:17:47 AM By: Geralyn Corwin DO Entered By: Geralyn Corwin on 05/24/2021 09:15:26 -------------------------------------------------------------------------------- Problem List Details Patient Name: Date of Service: [name] [name] 05/24/2021 8:45 A M Medical Record  Number: [id number] Patient Account Number: [id number] Date of Birth/Sex: Treating RN: 06-03-1956 (65 y.o. [name] Primary Care Provider: Tally Joe Other Clinician: Referring Provider: Treating Provider/Extender: Andrey Campanile in Treatment: 1 Active Problems ICD-10 Encounter Code Description Active Date MDM Diagnosis L97.529 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 05/16/2021 No Yes L97.519 Non-pressure chronic ulcer of other part of right foot with unspecified severity 05/16/2021 No Yes E11.621 Type 2 diabetes mellitus with foot ulcer 05/17/2021 No Yes E11.40 Type 2 diabetes mellitus with diabetic neuropathy, unspecified 05/17/2021 No Yes Inactive Problems Resolved Problems Electronic Signature(s) Signed: 05/24/2021 9:17:47 AM By: Geralyn Corwin DO Entered By: Geralyn Corwin on 05/24/2021 09:12:22 -------------------------------------------------------------------------------- Progress Note Details Patient Name: Date of Service: [name] [name] 05/24/2021 8:45 A M Medical Record Number: [id number] Patient Account Number: [id number] Date of Birth/Sex: Treating RN: [birth year] (65 y.o. [name] Primary Care Provider: Tally Joe Other Clinician: Referring Provider: Treating Provider/Extender: Andrey Campanile in Treatment: 1  Subjective Chief Complaint Information obtained from Patient Bilateral toe wounds History of Present Illness (HPI) Admission 6/27 Mr. Rigdon Macomber is a 65 year old male with a past medical history of insulin-dependent type 2 diabetes, CABG in 2011 and essential hypertension that presents to the clinic for bilateral toe wounds. He has wounds to his left great toe that started in May 2022. He also has wounds to his right great toe and second toe that started in September 2021. He noticed they started as blisters and eventually worsened. He is currently  using antibiotic ointment and keeping them covered. He reports minimal pain but has decreased sensation to his feet bilaterally. He denies signs of infection. 7/5; patient presents for 1 week follow-up. He did not receive supplies from our wound supplier [facility]. He ordered some silver alginate on [employer] and has been using this to the wound beds. He has also been aggressively offloading the areas. Overall he reports doing well and would like to hold off on the total contact cast since there has been improvement to the wound beds. He currently denies signs of infection. Patient History Information obtained from Patient. Family History Cancer - Mother, Heart Disease - Father, Hypertension - Father, Stroke - Siblings. Social History Never smoker, Marital Status - Married, Alcohol Use - Rarely - beer, Drug Use - No History, Caffeine Use - Daily. Medical History Eyes Denies history of Cataracts, Glaucoma, Optic Neuritis Hematologic/Lymphatic Denies history of Anemia, Hemophilia, Human Immunodeficiency Virus, Lymphedema, Sickle Cell Disease Respiratory Patient has history of Sleep Apnea - reported ; uses CPAP Denies history of Aspiration, Asthma, Chronic Obstructive Pulmonary Disease (COPD), Pneumothorax, Tuberculosis Cardiovascular Patient has history of Coronary Artery Disease - reported, Hypertension - reported Denies history of Angina, Arrhythmia, Congestive Heart Failure, Deep Vein Thrombosis, Hypotension, Myocardial Infarction, Peripheral Arterial Disease, Peripheral Venous Disease, Phlebitis, Vasculitis Gastrointestinal Denies history of Cirrhosis , Colitis, Crohnoos, Hepatitis A, Hepatitis B, Hepatitis C Endocrine Patient has history of Type II Diabetes - 2009 Denies history of Type I Diabetes Genitourinary Denies history of End Stage Renal Disease Immunological Denies history of Lupus Erythematosus, Raynaudoos, Scleroderma Integumentary (Skin) Denies history of History of  Burn Musculoskeletal Patient has history of Osteoarthritis - knees Denies history of Gout, Rheumatoid Arthritis, Osteomyelitis Neurologic Patient has history of Neuropathy - minor Denies history of Dementia, Quadriplegia, Paraplegia, Seizure Disorder Oncologic Denies history of Received Chemotherapy, Received Radiation Psychiatric Patient has history of 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 Denies history of Anorexia/bulimia Hospitalization/Surgery History - by pass heart. - urology stent. - ORIF of left 2nd toe. Medical A Surgical History Notes nd Cardiovascular blockages 2011 Endocrine wears monitor in skin that checks CBG constantly Objective Constitutional respirations regular, non-labored and within target range for patient.. Vitals Time Taken: 8:15 AM, Height: 72 in, Weight: 271 lbs, BMI: 36.8, Temperature: 97.6 F, Pulse: 77 bpm, Respiratory Rate: 16 breaths/min, Blood Pressure: 183/101 mmHg. Cardiovascular 2+ dorsalis pedis/posterior tibialis pulses. Psychiatric pleasant and cooperative. General Notes: Right foot: Great toe and second toe have open wounds with nonviable tissue, callus and granulation tissue present no signs of infection. Left foot: Great toe has an open wound with nonviable tissue callus and granulation tissue present. No signs of infection. Integumentary (Hair, Skin) Wound #2 status is Open. Original cause of wound was Gradually Appeared. The date acquired was: 03/20/2021. The wound has been in treatment 1 weeks. The wound is located on the Left T Great. The wound measures 0.8cm length x 1cm width x 0.3cm depth; 0.628cm^2 area and  0.188cm^3 volume. There is Fat oe Layer (Subcutaneous Tissue) exposed. There is no tunneling or undermining noted. There is a medium amount of serosanguineous drainage noted. The wound margin is thickened. There is large (67-100%) red, pink granulation within the wound bed. There is a small (1-33%) amount of  necrotic tissue within the wound bed including Adherent Slough. Wound #3 status is Open. Original cause of wound was Gradually Appeared. The date acquired was: 07/21/2020. The wound has been in treatment 1 weeks. The wound is located on the Right T Great. The wound measures 1.2cm length x 0.5cm width x 0.2cm depth; 0.471cm^2 area and 0.094cm^3 volume. There is Fat oe Layer (Subcutaneous Tissue) exposed. There is no tunneling or undermining noted. There is a medium amount of serosanguineous drainage noted. The wound margin is thickened. There is large (67-100%) red granulation within the wound bed. There is a small (1-33%) amount of necrotic tissue within the wound bed including Adherent Slough. Wound #4 status is Open. Original cause of wound was Gradually Appeared. The date acquired was: 05/02/2021. The wound has been in treatment 1 weeks. The wound is located on the Right T Second. The wound measures 0.3cm length x 0.4cm width x 0.2cm depth; 0.094cm^2 area and 0.019cm^3 volume. There is oe Fat Layer (Subcutaneous Tissue) exposed. There is no tunneling noted, however, there is undermining starting at 9:00 and ending at 12:00 with a maximum distance of 0.2cm. There is a medium amount of serosanguineous drainage noted. The wound margin is distinct with the outline attached to the wound base. There is large (67-100%) red, pink granulation within the wound bed. There is no necrotic tissue within the wound bed. Assessment Active Problems ICD-10 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 Non-pressure chronic ulcer of other part of right foot with unspecified severity Type 2 diabetes mellitus with foot ulcer Type 2 diabetes mellitus with diabetic neuropathy, unspecified Patient's wounds have shown significant improvement with silver alginate and offloading. He would like to hold off on doing a total contact cast and I think this is reasonable. I debrided the  nonviable tissue and callus to all the wound beds. I recommended continuing to use silver alginate and offloading the areas. We reached out to [facility] during the encounter so that the patient can receive his supplies. No signs of infection on exam. I will see him back in 1 week Procedures Wound #2 Pre-procedure diagnosis of Wound #2 is a Diabetic Wound/Ulcer of the Lower Extremity located on the Left T Great .Severity of Tissue Pre Debridement is: oe Fat layer exposed. There was a Excisional Skin/Subcutaneous Tissue Debridement with a total area of 1.2 sq cm performed by Geralyn Corwin, DO. With the following instrument(s): Blade, and Curette to remove Viable and Non-Viable tissue/material. Material removed includes Callus, Subcutaneous Tissue, Slough, Skin: Dermis, Skin: Epidermis, and Fibrin/Exudate after achieving pain control using Lidocaine 4% T opical Solution. A time out was conducted at 08:50, prior to the start of the procedure. A Minimum amount of bleeding was controlled with Pressure. The procedure was tolerated well with a pain level of 0 throughout and a pain level of 0 following the procedure. Post Debridement Measurements: 0.8cm length x 1cm width x 0.3cm depth; 0.188cm^3 volume. Character of Wound/Ulcer Post Debridement is improved. Severity of Tissue Post Debridement is: Fat layer exposed. Post procedure Diagnosis Wound #2: Same as Pre-Procedure Wound #3 Pre-procedure diagnosis of Wound #3 is a Diabetic Wound/Ulcer of the Lower Extremity located on the Right  T Great .Severity of Tissue Pre Debridement is: oe Fat layer exposed. There was a Excisional Skin/Subcutaneous Tissue Debridement with a total area of 0.6 sq cm performed by Geralyn Corwin, DO. With the following instrument(s): Blade, and Curette to remove Viable and Non-Viable tissue/material. Material removed includes Callus, Subcutaneous Tissue, Slough, Skin: Dermis, Skin: Epidermis, and Fibrin/Exudate after achieving pain  control using Lidocaine 4% T opical Solution. A time out was conducted at 08:50, prior to the start of the procedure. A Minimum amount of bleeding was controlled with Pressure. The procedure was tolerated well with a pain level of 0 throughout and a pain level of 0 following the procedure. Post Debridement Measurements: 1.2cm length x 0.5cm width x 0.2cm depth; 0.094cm^3 volume. Character of Wound/Ulcer Post Debridement is improved. Severity of Tissue Post Debridement is: Fat layer exposed. Post procedure Diagnosis Wound #3: Same as Pre-Procedure Plan Follow-up Appointments: Return Appointment in 1 week. - with Dr. Mikey Bussing Monday Other: - [facility] [employer]) for Supplies Bathing/ Shower/ Hygiene: May shower and wash wound with soap and water. Edema Control - Lymphedema / SCD / Other: Avoid standing for long periods of time. Off-Loading: Other: - -Offloading Shoe to Right Foot -Reduce as much pressure as possible to both feet, decrease work hours if possible. -Consider using T Contact otal Cast next week. Additional Orders / Instructions: Follow Nutritious Diet - -Increase protein, 100-120g daily -Keep blood sugars monitored WOUND #2: - T Great Wound Laterality: Left oe Cleanser: Soap and Water 1 x Per Day/15 Days Discharge Instructions: May shower and wash wound with dial antibacterial soap and water prior to dressing change. Prim Dressing: KerraCel Ag Gelling Fiber Dressing, 2x2 in (silver alginate) 1 x Per Day/15 Days ary Discharge Instructions: Apply silver alginate to wound bed as instructed Secondary Dressing: Woven Gauze Sponges 2x2 in 1 x Per Day/15 Days Discharge Instructions: Apply over primary dressing as directed. Secondary Dressing: Optifoam Non-Adhesive Dressing, 4x4 in 1 x Per Day/15 Days Discharge Instructions: Apply as donut over dressing Secured With: Conforming Stretch Gauze Bandage, Sterile 2x75 (in/in) 1 x Per Day/15 Days Discharge Instructions: Secure with stretch gauze  as directed. Secured With: Transpore Surgical T ape, 2x10 (in/yd) 1 x Per Day/15 Days Discharge Instructions: Secure dressing with tape as directed. WOUND #3: - T Great Wound Laterality: Right oe Cleanser: Soap and Water 1 x Per Day/15 Days Discharge Instructions: May shower and wash wound with dial antibacterial soap and water prior to dressing change. Prim Dressing: KerraCel Ag Gelling Fiber Dressing, 2x2 in (silver alginate) 1 x Per Day/15 Days ary Discharge Instructions: Apply silver alginate to wound bed as instructed Secondary Dressing: Woven Gauze Sponges 2x2 in 1 x Per Day/15 Days Discharge Instructions: Apply over primary dressing as directed. Secondary Dressing: Optifoam Non-Adhesive Dressing, 4x4 in 1 x Per Day/15 Days Discharge Instructions: Apply as donut over dressing Secured With: Conforming Stretch Gauze Bandage, Sterile 2x75 (in/in) 1 x Per Day/15 Days Discharge Instructions: Secure with stretch gauze as directed. Secured With: Transpore Surgical T ape, 2x10 (in/yd) 1 x Per Day/15 Days Discharge Instructions: Secure dressing with tape as directed. WOUND #4: - T Second Wound Laterality: Right oe Cleanser: Soap and Water 1 x Per Day/15 Days Discharge Instructions: May shower and wash wound with dial antibacterial soap and water prior to dressing change. Prim Dressing: KerraCel Ag Gelling Fiber Dressing, 2x2 in (silver alginate) 1 x Per Day/15 Days ary Discharge Instructions: Apply silver alginate to wound bed as instructed Secondary Dressing: Woven Gauze Sponges 2x2 in  1 x Per Day/15 Days Discharge Instructions: Apply over primary dressing as directed. Secondary Dressing: Optifoam Non-Adhesive Dressing, 4x4 in 1 x Per Day/15 Days Discharge Instructions: Apply as donut over dressing Secured With: Conforming Stretch Gauze Bandage, Sterile 2x75 (in/in) 1 x Per Day/15 Days Discharge Instructions: Secure with stretch gauze as directed. Secured With: Transpore Surgical T ape,  2x10 (in/yd) 1 x Per Day/15 Days Discharge Instructions: Secure dressing with tape as directed. 1. In office sharp debridement 2. Aggressive offloading 3. Silver alginate 4. Follow-up in 1 week Electronic Signature(s) Signed: 05/24/2021 9:17:47 AM By: Geralyn Corwin DO Entered By: Geralyn Corwin on 05/24/2021 09:17:12 -------------------------------------------------------------------------------- HxROS Details Patient Name: Date of Service: [name] [name] 05/24/2021 8:45 A M Medical Record Number: [id number] Patient Account Number: [id number] Date of Birth/Sex: Treating RN: 09-Nov-1956 (65 y.o. [name] Primary Care Provider: Tally Joe Other Clinician: Referring Provider: Treating Provider/Extender: Andrey Campanile in Treatment: 1 Information Obtained From Patient Eyes Medical History: Negative for: Cataracts; Glaucoma; Optic Neuritis Hematologic/Lymphatic Medical History: Negative for: Anemia; Hemophilia; Human Immunodeficiency Virus; Lymphedema; Sickle Cell Disease Respiratory Medical History: Positive for: Sleep Apnea - reported ; uses CPAP Negative for: Aspiration; Asthma; Chronic Obstructive Pulmonary Disease (COPD); Pneumothorax; Tuberculosis Cardiovascular Medical History: Positive for: Coronary Artery Disease - reported; Hypertension - reported Negative for: Angina; Arrhythmia; Congestive Heart Failure; Deep Vein Thrombosis; Hypotension; Myocardial Infarction; Peripheral Arterial Disease; Peripheral Venous Disease; Phlebitis; Vasculitis Past Medical History Notes: blockages 2011 Gastrointestinal Medical History: Negative for: Cirrhosis ; Colitis; Crohns; Hepatitis A; Hepatitis B; Hepatitis C Endocrine Medical History: Positive for: Type II Diabetes - 2009 Negative for: Type I Diabetes Past Medical History Notes: wears monitor in skin that checks CBG constantly Time with diabetes: 2009 Treated with: Insulin Blood sugar  tested every day: No Blood sugar testing results: Breakfast: 90-120 Genitourinary Medical History: Negative for: End Stage Renal Disease Immunological Medical History: Negative for: Lupus Erythematosus; Raynauds; Scleroderma Integumentary (Skin) Medical History: Negative for: History of Burn Musculoskeletal Medical History: Positive for: Osteoarthritis - knees Negative for: Gout; Rheumatoid Arthritis; Osteomyelitis Neurologic Medical History: Positive for: Neuropathy - minor Negative for: Dementia; Quadriplegia; Paraplegia; Seizure Disorder Oncologic Medical History: Negative for: Received Chemotherapy; Received Radiation Psychiatric Medical History: 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 Negative for: Anorexia/bulimia Immunizations Pneumococcal Vaccine: Received Pneumococcal Vaccination: No Immunization Notes: up to date on pneumonia shots per his PCP Implantable Devices None Hospitalization / Surgery History Type of Hospitalization/Surgery by pass heart urology stent ORIF of left 2nd toe Family and Social History Cancer: Yes - Mother; Heart Disease: Yes - Father; Hypertension: Yes - Father; Stroke: Yes - Siblings; Never smoker; Marital Status - Married; Alcohol Use: Rarely - beer; Drug Use: No History; Caffeine Use: Daily; Financial Concerns: No; Food, Clothing or Shelter Needs: No; Support System Lacking: No; Transportation Concerns: No Electronic Signature(s) Signed: 05/24/2021 9:17:47 AM By: Geralyn Corwin DO Signed: 05/24/2021 6:55:28 PM By: Shawn Stall Entered By: Geralyn Corwin on 05/24/2021 09:14:22 -------------------------------------------------------------------------------- SuperBill Details Patient Name: Date of Service: [name] [name] 05/24/2021 Medical Record Number: [id number] Patient Account Number: [id number] Date of Birth/Sex: Treating RN: [birth year] (65 y.o. 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 L97.519 Non-pressure chronic ulcer of other part of right foot with unspecified severity E11.621 Type 2 diabetes  mellitus with foot ulcer E11.40 Type 2 diabetes mellitus with diabetic neuropathy, unspecified Facility Procedures CPT4 Code: [id number] Description: 11042 - DEB SUBQ TISSUE 20 SQ CM/< ICD-10 Diagnosis Description L97.529 Non-pressure chronic ulcer of other part of left foot with unspecified severit L97.519 Non-pressure chronic ulcer of other part of right foot with unspecified severi Modifier: y ty Quantity: 1 Physician Procedures : CPT4 Code Description Modifier [id number] 11042 - WC PHYS SUBQ TISS 20 SQ CM 1 ICD-10 Diagnosis Description L97.529 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

## 2021-05-30 ENCOUNTER — Encounter (HOSPITAL_BASED_OUTPATIENT_CLINIC_OR_DEPARTMENT_OTHER): Payer: 59 | Admitting: Internal Medicine

## 2021-05-30 ENCOUNTER — Other Ambulatory Visit: Payer: Self-pay

## 2021-05-30 DIAGNOSIS — L97519 Non-pressure chronic ulcer of other part of right foot with unspecified severity: Secondary | ICD-10-CM

## 2021-05-30 DIAGNOSIS — E11621 Type 2 diabetes mellitus with foot ulcer: Secondary | ICD-10-CM | POA: Diagnosis not present

## 2021-05-30 DIAGNOSIS — L97529 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: Secondary | ICD-10-CM

## 2021-05-30 NOTE — Progress Notes (Signed)
[name], [name] ([id number]) Visit Report for 05/30/2021 Chief Complaint Document Details Patient Name: Date of Service: [name] [name], [name] [name] 05/30/2021 8:30 A M Medical Record Number: [id number] Patient Account Number: [id number] Date of Birth/Sex: Treating RN: 04/08/1956 (65 y.o. [name] Primary Care Provider: Tally Joe Other Clinician: Referring Provider: Treating Provider/Extender: Andrey Campanile in Treatment: 2 Information Obtained from: Patient Chief Complaint Bilateral toe wounds Electronic Signature(s) Signed: 05/30/2021 10:46:05 AM By: Geralyn Corwin DO Entered By: Geralyn Corwin on 05/30/2021 10:42:13 -------------------------------------------------------------------------------- Debridement Details Patient Name: Date of Service: [name] [name] 05/30/2021 8:30 A M Medical Record Number: [id number] Patient Account Number: [id number] Date of Birth/Sex: Treating RN: [birth year] (65 y.o. [name] Primary Care Provider: Tally Joe Other Clinician: Referring Provider: Treating Provider/Extender: Andrey Campanile in Treatment: 2 Debridement Performed for Assessment: Wound #4 Right T Second oe Performed By: Physician Geralyn Corwin, DO Debridement Type: Debridement Severity of Tissue Pre Debridement: Fat layer exposed Level of Consciousness (Pre-procedure): Awake and Alert Pre-procedure Verification/Time Out Yes - 09:31 Taken: Start Time: 09:34 Pain Control: Other : Benzocaine T Area Debrided (L x W): otal 0.3 (cm) x 0.4 (cm) = 0.12 (cm) Tissue and other material debrided: Non-Viable, Callus Level: Non-Viable Tissue Debridement Description: Selective/Open Wound Instrument: Curette Bleeding: Minimum Hemostasis Achieved: Pressure End Time: 09:36 Response to Treatment: Procedure was tolerated well Level of Consciousness (Post- Awake and Alert procedure): Post Debridement Measurements of Total  Wound Length: (cm) 0.3 Width: (cm) 0.4 Depth: (cm) 0.2 Volume: (cm) 0.019 Character of Wound/Ulcer Post Debridement: Stable Severity of Tissue Post Debridement: Fat layer exposed Post Procedure Diagnosis Same as Pre-procedure Electronic Signature(s) Signed: 05/30/2021 10:46:05 AM By: Geralyn Corwin DO Signed: 05/30/2021 5:19:06 PM By: Antonieta Iba Entered By: Antonieta Iba on 05/30/2021 09:38:28 -------------------------------------------------------------------------------- Debridement Details Patient Name: Date of Service: [name] [name]. 05/30/2021 8:30 A M Medical Record Number: [id number] Patient Account Number: [id number] Date of Birth/Sex: Treating RN: Oct 18, 1956 (65 y.o. [name] Primary Care Provider: Tally Joe Other Clinician: Referring Provider: Treating Provider/Extender: Andrey Campanile in Treatment: 2 Debridement Performed for Assessment: Wound #3 Right T Great oe Performed By: Physician Geralyn Corwin, DO Debridement Type: Debridement Severity of Tissue Pre Debridement: Fat layer exposed Level of Consciousness (Pre-procedure): Awake and Alert Pre-procedure Verification/Time Out Yes - 09:31 Taken: Start Time: 09:32 Pain Control: Other : Benzocaine T Area Debrided (L x W): otal 1 (cm) x 0.4 (cm) = 0.4 (cm) Tissue and other material debrided: Non-Viable, Callus, Subcutaneous Level: Skin/Subcutaneous Tissue Debridement Description: Excisional Instrument: Curette Bleeding: Minimum Hemostasis Achieved: Pressure End Time: 09:34 Response to Treatment: Procedure was tolerated well Level of Consciousness (Post- Awake and Alert procedure): Post Debridement Measurements of Total Wound Length: (cm) 1 Width: (cm) 0.4 Depth: (cm) 0.2 Volume: (cm) 0.063 Character of Wound/Ulcer Post Debridement: Stable Severity of Tissue Post Debridement: Fat layer exposed Post Procedure Diagnosis Same as Pre-procedure Electronic  Signature(s) Signed: 05/30/2021 10:46:05 AM By: Geralyn Corwin DO Signed: 05/30/2021 5:19:06 PM By: Antonieta Iba Entered By: Antonieta Iba on 05/30/2021 09:38:44 -------------------------------------------------------------------------------- Debridement Details Patient Name: Date of Service: [name] [name]. 05/30/2021 8:30 A M Medical Record Number: [id number] Patient Account Number: [id number] Date of Birth/Sex: Treating RN: [birth year] (65 y.o. [name] Primary Care Provider: Tally Joe Other Clinician: Referring Provider: Treating Provider/Extender: Andrey Campanile in Treatment: 2 Debridement Performed for Assessment: Wound #2 Left T Great oe Performed By: Physician Geralyn Corwin, DO Debridement  Type: Debridement Severity of Tissue Pre Debridement: Fat layer exposed Level of Consciousness (Pre-procedure): Awake and Alert Pre-procedure Verification/Time Out Yes - 09:31 Taken: Start Time: 09:36 Pain Control: Other : Benzocaine T Area Debrided (L x W): otal 0.8 (cm) x 0.8 (cm) = 0.64 (cm) Tissue and other material debrided: Non-Viable, Callus, Subcutaneous Level: Skin/Subcutaneous Tissue Debridement Description: Excisional Instrument: Curette Bleeding: Minimum Hemostasis Achieved: Pressure End Time: 09:41 Response to Treatment: Procedure was tolerated well Level of Consciousness (Post- Awake and Alert procedure): Post Debridement Measurements of Total Wound Length: (cm) 0.8 Width: (cm) 0.8 Depth: (cm) 0.3 Volume: (cm) 0.151 Character of Wound/Ulcer Post Debridement: Stable Severity of Tissue Post Debridement: Fat layer exposed Post Procedure Diagnosis Same as Pre-procedure Electronic Signature(s) Signed: 05/30/2021 10:46:05 AM By: Geralyn Corwin DO Signed: 05/30/2021 5:19:06 PM By: Antonieta Iba Entered By: Antonieta Iba on 05/30/2021  09:42:03 -------------------------------------------------------------------------------- HPI Details Patient Name: Date of Service: [name] [name]. 05/30/2021 8:30 A M Medical Record Number: [id number] Patient Account Number: [id number] Date of Birth/Sex: Treating RN: 06-Apr-1956 (65 y.o. [name] Primary Care Provider: Tally Joe Other Clinician: Referring Provider: Treating Provider/Extender: Andrey Campanile in Treatment: 2 History of Present Illness HPI Description: Admission 6/27 [name] is a 65 year old male with a past medical history of insulin-dependent type 2 diabetes, CABG in 2011 and essential hypertension that presents to the clinic for bilateral toe wounds. He has wounds to his left great toe that started in May 2022. He also has wounds to his right great toe and second toe that started in September 2021. He noticed they started as blisters and eventually worsened. He is currently using antibiotic ointment and keeping them covered. He reports minimal pain but has decreased sensation to his feet bilaterally. He denies signs of infection. 7/5; patient presents for 1 week follow-up. He did not receive supplies from our wound supplier [facility]. He ordered some silver alginate on [employer] and has been using this to the wound beds. He has also been aggressively offloading the areas. Overall he reports doing well and would like to hold off on the total contact cast since there has been improvement to the wound beds. He currently denies signs of infection. 7/11; patient presents for 1 week follow-up. He has been using silver alginate to the wound beds but taking his dressing off at night. He would like to continue using his offloading shoe and hold off on the cast for now. He currently denies signs of infection. Electronic Signature(s) Signed: 05/30/2021 10:46:05 AM By: Geralyn Corwin DO Entered By: Geralyn Corwin on 05/30/2021  10:42:58 -------------------------------------------------------------------------------- Physical Exam Details Patient Name: Date of Service: TYTUS, STRAHLE 05/30/2021 8:30 A M Medical Record Number: [id number] Patient Account Number: [id number] Date of Birth/Sex: Treating RN: [birth year] (65 y.o. [name] Primary Care Provider: Tally Joe Other Clinician: Referring Provider: Treating Provider/Extender: Andrey Campanile in Treatment: 2 Constitutional respirations regular, non-labored and within target range for patient.Marland Kitchen Psychiatric pleasant and cooperative. Notes Right foot: Great toe and second toe have open wounds with nonviable tissue, callus and granulation tissue present no signs of infection. Left foot: Great toe has an open wound with nonviable tissue callus and granulation tissue present. No signs of infection. Electronic Signature(s) Signed: 05/30/2021 10:46:05 AM By: Geralyn Corwin DO Entered By: Geralyn Corwin on 05/30/2021 10:43:38 -------------------------------------------------------------------------------- Physician Orders Details Patient Name: Date of Service: [name] [name] 05/30/2021 8:30 A M Medical Record Number: [id number] Patient Account Number: [id number]  Date of Birth/Sex: Treating RN: 29-Jun-1956 (65 y.o. [name] Primary Care Provider: Tally Joe Other Clinician: Referring Provider: Treating Provider/Extender: Andrey Campanile in Treatment: 2 Verbal / Phone Orders: No Diagnosis Coding ICD-10 Coding Code Description [phone] 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 L97.519 Non-pressure chronic ulcer of other part of right foot with unspecified severity E11.621 Type 2 diabetes mellitus with foot ulcer E11.40 Type 2 diabetes mellitus with diabetic neuropathy, unspecified Follow-up Appointments ppointment in 1 week. - with Dr. Mikey Bussing  Monday Return A Other: - [facility] [employer] for Supplies Bathing/ Shower/ Hygiene May shower and wash wound with soap and water. Edema Control - Lymphedema / SCD / Other Avoid standing for long periods of time. Off-Loading Other: - -Offloading Shoe to Right Foot -Reduce as much pressure as possible to both feet, decrease work hours if possible. Additional Orders / Instructions Follow Nutritious Diet - -Increase protein, 100-120g daily -Keep blood sugars monitored Wound Treatment Wound #2 - T Great oe Wound Laterality: Left Cleanser: Soap and Water 1 x Per Day/15 Days Discharge Instructions: May shower and wash wound with dial antibacterial soap and water prior to dressing change. Prim Dressing: KerraCel Ag Gelling Fiber Dressing, 2x2 in (silver alginate) 1 x Per Day/15 Days ary Discharge Instructions: Apply silver alginate to wound bed as instructed Secondary Dressing: Woven Gauze Sponges 2x2 in 1 x Per Day/15 Days Discharge Instructions: Apply over primary dressing as directed. Secondary Dressing: Optifoam Non-Adhesive Dressing, 4x4 in 1 x Per Day/15 Days Discharge Instructions: Apply as donut over dressing Secured With: Conforming Stretch Gauze Bandage, Sterile 2x75 (in/in) 1 x Per Day/15 Days Discharge Instructions: Secure with stretch gauze as directed. Secured With: Transpore Surgical Tape, 2x10 (in/yd) 1 x Per Day/15 Days Discharge Instructions: Secure dressing with tape as directed. Wound #3 - T Great oe Wound Laterality: Right Cleanser: Soap and Water 1 x Per Day/15 Days Discharge Instructions: May shower and wash wound with dial antibacterial soap and water prior to dressing change. Prim Dressing: KerraCel Ag Gelling Fiber Dressing, 2x2 in (silver alginate) 1 x Per Day/15 Days ary Discharge Instructions: Apply silver alginate to wound bed as instructed Secondary Dressing: Woven Gauze Sponges 2x2 in 1 x Per Day/15 Days Discharge Instructions: Apply over primary dressing as  directed. Secondary Dressing: Optifoam Non-Adhesive Dressing, 4x4 in 1 x Per Day/15 Days Discharge Instructions: Apply as donut over dressing Secured With: Conforming Stretch Gauze Bandage, Sterile 2x75 (in/in) 1 x Per Day/15 Days Discharge Instructions: Secure with stretch gauze as directed. Secured With: Transpore Surgical Tape, 2x10 (in/yd) 1 x Per Day/15 Days Discharge Instructions: Secure dressing with tape as directed. Wound #4 - T Second oe Wound Laterality: Right Cleanser: Soap and Water 1 x Per Day/15 Days Discharge Instructions: May shower and wash wound with dial antibacterial soap and water prior to dressing change. Prim Dressing: KerraCel Ag Gelling Fiber Dressing, 2x2 in (silver alginate) 1 x Per Day/15 Days ary Discharge Instructions: Apply silver alginate to wound bed as instructed Secondary Dressing: Woven Gauze Sponges 2x2 in 1 x Per Day/15 Days Discharge Instructions: Apply over primary dressing as directed. Secondary Dressing: Optifoam Non-Adhesive Dressing, 4x4 in 1 x Per Day/15 Days Discharge Instructions: Apply as donut over dressing Secured With: Conforming Stretch Gauze Bandage, Sterile 2x75 (in/in) 1 x Per Day/15 Days Discharge Instructions: Secure with stretch gauze as directed. Secured With: Transpore Surgical Tape, 2x10 (in/yd) 1 x Per Day/15 Days Discharge Instructions: Secure dressing with tape as  directed. Electronic Signature(s) Signed: 05/30/2021 10:46:05 AM By: Geralyn Corwin DO Entered By: Geralyn Corwin on 05/30/2021 10:43:51 -------------------------------------------------------------------------------- Problem List Details Patient Name: Date of Service: [name] [name] 05/30/2021 8:30 A M Medical Record Number: [id number] Patient Account Number: [id number] Date of Birth/Sex: Treating RN: 07-Nov-1956 (65 y.o. [name] Primary Care Provider: Tally Joe Other Clinician: Referring Provider: Treating Provider/Extender: Andrey Campanile in Treatment: 2 Active Problems ICD-10 Encounter Code Description Active Date MDM Diagnosis L97.529 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 05/16/2021 No Yes L97.519 Non-pressure chronic ulcer of other part of right foot with unspecified severity 05/16/2021 No Yes E11.621 Type 2 diabetes mellitus with foot ulcer 05/17/2021 No Yes E11.40 Type 2 diabetes mellitus with diabetic neuropathy, unspecified 05/17/2021 No Yes Inactive Problems Resolved Problems Electronic Signature(s) Signed: 05/30/2021 10:46:05 AM By: Geralyn Corwin DO Entered By: Geralyn Corwin on 05/30/2021 10:41:09 -------------------------------------------------------------------------------- Progress Note Details Patient Name: Date of Service: [name] [name] 05/30/2021 8:30 A M Medical Record Number: [id number] Patient Account Number: [id number] Date of Birth/Sex: Treating RN: 1955-12-05 (65 y.o. [name] Primary Care Provider: Tally Joe Other Clinician: Referring Provider: Treating Provider/Extender: Andrey Campanile in Treatment: 2 Subjective Chief Complaint Information obtained from Patient Bilateral toe wounds History of Present Illness (HPI) Admission 6/27 Mr. Makale Pindell is a 65 year old male with a past medical history of insulin-dependent type 2 diabetes, CABG in 2011 and essential hypertension that presents to the clinic for bilateral toe wounds. He has wounds to his left great toe that started in May 2022. He also has wounds to his right great toe and second toe that started in September 2021. He noticed they started as blisters and eventually worsened. He is currently using antibiotic ointment and keeping them covered. He reports minimal pain but has decreased sensation to his feet bilaterally. He denies signs of infection. 7/5; patient presents for 1 week follow-up. He did not receive supplies from our  wound supplier [facility]. He ordered some silver alginate on [employer] and has been using this to the wound beds. He has also been aggressively offloading the areas. Overall he reports doing well and would like to hold off on the total contact cast since there has been improvement to the wound beds. He currently denies signs of infection. 7/11; patient presents for 1 week follow-up. He has been using silver alginate to the wound beds but taking his dressing off at night. He would like to continue using his offloading shoe and hold off on the cast for now. He currently denies signs of infection. Patient History Information obtained from Patient. Family History Cancer - Mother, Heart Disease - Father, Hypertension - Father, Stroke - Siblings. Social History Never smoker, Marital Status - Married, Alcohol Use - Rarely - beer, Drug Use - No History, Caffeine Use - Daily. Medical History Eyes Denies history of Cataracts, Glaucoma, Optic Neuritis Hematologic/Lymphatic Denies history of Anemia, Hemophilia, Human Immunodeficiency Virus, Lymphedema, Sickle Cell Disease Respiratory Patient has history of Sleep Apnea - reported ; uses CPAP Denies history of Aspiration, Asthma, Chronic Obstructive Pulmonary Disease (COPD), Pneumothorax, Tuberculosis Cardiovascular Patient has history of Coronary Artery Disease - reported, Hypertension - reported Denies history of Angina, Arrhythmia, Congestive Heart Failure, Deep Vein Thrombosis, Hypotension, Myocardial Infarction, Peripheral Arterial Disease, Peripheral Venous Disease, Phlebitis, Vasculitis Gastrointestinal Denies history of Cirrhosis , Colitis, Crohnoos, Hepatitis A, Hepatitis B, Hepatitis C Endocrine Patient has history of Type II Diabetes - 2009  Denies history of Type I Diabetes Genitourinary Denies history of End Stage Renal Disease Immunological Denies history of Lupus Erythematosus, Raynaudoos, Scleroderma Integumentary (Skin) Denies history of  History of Burn Musculoskeletal Patient has history of Osteoarthritis - knees Denies history of Gout, Rheumatoid Arthritis, Osteomyelitis Neurologic Patient has history of Neuropathy - minor Denies history of Dementia, Quadriplegia, Paraplegia, Seizure Disorder Oncologic Denies history of Received Chemotherapy, Received Radiation Psychiatric Patient has history of 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 Denies history of Anorexia/bulimia Hospitalization/Surgery History - by pass heart. - urology stent. - ORIF of left 2nd toe. Medical A Surgical History Notes nd Cardiovascular blockages 2011 Endocrine wears monitor in skin that checks CBG constantly Objective Constitutional respirations regular, non-labored and within target range for patient.. Vitals Time Taken: 8:43 AM, Height: 72 in, Weight: 271 lbs, BMI: 36.8, Temperature: 97.7 F, Pulse: 74 bpm, Respiratory Rate: 17 breaths/min, Blood Pressure: 160/97 mmHg, Capillary Blood Glucose: 137 mg/dl. Psychiatric pleasant and cooperative. General Notes: Right foot: Great toe and second toe have open wounds with nonviable tissue, callus and granulation tissue present no signs of infection. Left foot: Great toe has an open wound with nonviable tissue callus and granulation tissue present. No signs of infection. Integumentary (Hair, Skin) Wound #2 status is Open. Original cause of wound was Gradually Appeared. The date acquired was: 03/20/2021. The wound has been in treatment 2 weeks. The wound is located on the Left T Great. The wound measures 0.8cm length x 0.8cm width x 0.3cm depth; 0.503cm^2 area and 0.151cm^3 volume. There is Fat oe Layer (Subcutaneous Tissue) exposed. There is no tunneling or undermining noted. There is a medium amount of serosanguineous drainage noted. The wound margin is thickened. There is large (67-100%) red, pink granulation within the wound bed. There is a small (1-33%) amount of necrotic tissue  within the wound bed including Adherent Slough. Wound #3 status is Open. Original cause of wound was Gradually Appeared. The date acquired was: 07/21/2020. The wound has been in treatment 2 weeks. The wound is located on the Right T Great. The wound measures 1cm length x 0.4cm width x 0.21cm depth; 0.314cm^2 area and 0.066cm^3 volume. There is Fat oe Layer (Subcutaneous Tissue) exposed. There is no tunneling or undermining noted. There is a medium amount of serosanguineous drainage noted. The wound margin is thickened. There is large (67-100%) red granulation within the wound bed. There is a small (1-33%) amount of necrotic tissue within the wound bed including Adherent Slough. Wound #4 status is Open. Original cause of wound was Gradually Appeared. The date acquired was: 05/02/2021. The wound has been in treatment 2 weeks. The wound is located on the Right T Second. The wound measures 0.3cm length x 0.4cm width x 0.2cm depth; 0.094cm^2 area and 0.019cm^3 volume. There is oe Fat Layer (Subcutaneous Tissue) exposed. There is no tunneling or undermining noted. There is a medium amount of serosanguineous drainage noted. The wound margin is distinct with the outline attached to the wound base. There is large (67-100%) red, pink granulation within the wound bed. There is no necrotic tissue within the wound bed. Assessment Active Problems ICD-10 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 Non-pressure chronic ulcer of other part of right foot with unspecified severity Type 2 diabetes mellitus with foot ulcer Type 2 diabetes mellitus with diabetic neuropathy, unspecified Patient's wound has shown improvement in size and appearance since last clinic visit. I debrided nonviable tissue. I recommended continuing with silver alginate daily and  offloading the feet. We discussed that he should keep the dressing on until the next dressing change and not take it off at night.  Overall no signs of infection. I will see him in 1 week Procedures Wound #2 Pre-procedure diagnosis of Wound #2 is a Diabetic Wound/Ulcer of the Lower Extremity located on the Left T Great .Severity of Tissue Pre Debridement is: oe Fat layer exposed. There was a Excisional Skin/Subcutaneous Tissue Debridement with a total area of 0.64 sq cm performed by Geralyn CorwinHoffman, Natallia Stellmach, DO. With the following instrument(s): Curette to remove Non-Viable tissue/material. Material removed includes Callus and Subcutaneous Tissue and after achieving pain control using Other (Benzocaine). No specimens were taken. A time out was conducted at 09:31, prior to the start of the procedure. A Minimum amount of bleeding was controlled with Pressure. The procedure was tolerated well. Post Debridement Measurements: 0.8cm length x 0.8cm width x 0.3cm depth; 0.151cm^3 volume. Character of Wound/Ulcer Post Debridement is stable. Severity of Tissue Post Debridement is: Fat layer exposed. Post procedure Diagnosis Wound #2: Same as Pre-Procedure Wound #3 Pre-procedure diagnosis of Wound #3 is a Diabetic Wound/Ulcer of the Lower Extremity located on the Right T Great .Severity of Tissue Pre Debridement is: oe Fat layer exposed. There was a Excisional Skin/Subcutaneous Tissue Debridement with a total area of 0.4 sq cm performed by Geralyn CorwinHoffman, Rosary Filosa, DO. With the following instrument(s): Curette to remove Non-Viable tissue/material. Material removed includes Callus and Subcutaneous Tissue and after achieving pain control using Other (Benzocaine). No specimens were taken. A time out was conducted at 09:31, prior to the start of the procedure. A Minimum amount of bleeding was controlled with Pressure. The procedure was tolerated well. Post Debridement Measurements: 1cm length x 0.4cm width x 0.2cm depth; 0.063cm^3 volume. Character of Wound/Ulcer Post Debridement is stable. Severity of Tissue Post Debridement is: Fat layer exposed. Post  procedure Diagnosis Wound #3: Same as Pre-Procedure Wound #4 Pre-procedure diagnosis of Wound #4 is a Diabetic Wound/Ulcer of the Lower Extremity located on the Right T Second .Severity of Tissue Pre Debridement oe is: Fat layer exposed. There was a Selective/Open Wound Non-Viable Tissue Debridement with a total area of 0.12 sq cm performed by Geralyn CorwinHoffman, Randol Zumstein, DO. With the following instrument(s): Curette to remove Non-Viable tissue/material. Material removed includes Callus after achieving pain control using Other (Benzocaine). No specimens were taken. A time out was conducted at 09:31, prior to the start of the procedure. A Minimum amount of bleeding was controlled with Pressure. The procedure was tolerated well. Post Debridement Measurements: 0.3cm length x 0.4cm width x 0.2cm depth; 0.019cm^3 volume. Character of Wound/Ulcer Post Debridement is stable. Severity of Tissue Post Debridement is: Fat layer exposed. Post procedure Diagnosis Wound #4: Same as Pre-Procedure Plan Follow-up Appointments: Return Appointment in 1 week. - with Dr. Mikey BussingHoffman Monday Other: - [facility] [employer] for Supplies Bathing/ Shower/ Hygiene: May shower and wash wound with soap and water. Edema Control - Lymphedema / SCD / Other: Avoid standing for long periods of time. Off-Loading: Other: - -Offloading Shoe to Right Foot -Reduce as much pressure as possible to both feet, decrease work hours if possible. Additional Orders / Instructions: Follow Nutritious Diet - -Increase protein, 100-120g daily -Keep blood sugars monitored WOUND #2: - T Great Wound Laterality: Left oe Cleanser: Soap and Water 1 x Per Day/15 Days Discharge Instructions: May shower and wash wound with dial antibacterial soap and water prior to dressing change. Prim Dressing: KerraCel Ag Gelling Fiber Dressing, 2x2 in (silver alginate) 1 x  Per Day/15 Days ary Discharge Instructions: Apply silver alginate to wound bed as instructed Secondary Dressing:  Woven Gauze Sponges 2x2 in 1 x Per Day/15 Days Discharge Instructions: Apply over primary dressing as directed. Secondary Dressing: Optifoam Non-Adhesive Dressing, 4x4 in 1 x Per Day/15 Days Discharge Instructions: Apply as donut over dressing Secured With: Conforming Stretch Gauze Bandage, Sterile 2x75 (in/in) 1 x Per Day/15 Days Discharge Instructions: Secure with stretch gauze as directed. Secured With: Transpore Surgical Tape, 2x10 (in/yd) 1 x Per Day/15 Days Discharge Instructions: Secure dressing with tape as directed. WOUND #3: - T Great Wound Laterality: Right oe Cleanser: Soap and Water 1 x Per Day/15 Days Discharge Instructions: May shower and wash wound with dial antibacterial soap and water prior to dressing change. Prim Dressing: KerraCel Ag Gelling Fiber Dressing, 2x2 in (silver alginate) 1 x Per Day/15 Days ary Discharge Instructions: Apply silver alginate to wound bed as instructed Secondary Dressing: Woven Gauze Sponges 2x2 in 1 x Per Day/15 Days Discharge Instructions: Apply over primary dressing as directed. Secondary Dressing: Optifoam Non-Adhesive Dressing, 4x4 in 1 x Per Day/15 Days Discharge Instructions: Apply as donut over dressing Secured With: Conforming Stretch Gauze Bandage, Sterile 2x75 (in/in) 1 x Per Day/15 Days Discharge Instructions: Secure with stretch gauze as directed. Secured With: Transpore Surgical Tape, 2x10 (in/yd) 1 x Per Day/15 Days Discharge Instructions: Secure dressing with tape as directed. WOUND #4: - T Second Wound Laterality: Right oe Cleanser: Soap and Water 1 x Per Day/15 Days Discharge Instructions: May shower and wash wound with dial antibacterial soap and water prior to dressing change. Prim Dressing: KerraCel Ag Gelling Fiber Dressing, 2x2 in (silver alginate) 1 x Per Day/15 Days ary Discharge Instructions: Apply silver alginate to wound bed as instructed Secondary Dressing: Woven Gauze Sponges 2x2 in 1 x Per Day/15  Days Discharge Instructions: Apply over primary dressing as directed. Secondary Dressing: Optifoam Non-Adhesive Dressing, 4x4 in 1 x Per Day/15 Days Discharge Instructions: Apply as donut over dressing Secured With: Conforming Stretch Gauze Bandage, Sterile 2x75 (in/in) 1 x Per Day/15 Days Discharge Instructions: Secure with stretch gauze as directed. Secured With: Transpore Surgical Tape, 2x10 (in/yd) 1 x Per Day/15 Days Discharge Instructions: Secure dressing with tape as directed. 1. In office sharp debridement 2. Silver alginate 3. Follow-up in 1 week 4. Aggressive offloading Electronic Signature(s) Signed: 05/30/2021 10:46:05 AM By: Geralyn Corwin DO Entered By: Geralyn Corwin on 05/30/2021 10:45:36 -------------------------------------------------------------------------------- HxROS Details Patient Name: Date of Service: [name] [name]. 05/30/2021 8:30 A M Medical Record Number: [id number] Patient Account Number: [id number] Date of Birth/Sex: Treating RN: Mar 09, 1956 (65 y.o. [name] Primary Care Provider: Tally Joe Other Clinician: Referring Provider: Treating Provider/Extender: Andrey Campanile in Treatment: 2 Information Obtained From Patient Eyes Medical History: Negative for: Cataracts; Glaucoma; Optic Neuritis Hematologic/Lymphatic Medical History: Negative for: Anemia; Hemophilia; Human Immunodeficiency Virus; Lymphedema; Sickle Cell Disease Respiratory Medical History: Positive for: Sleep Apnea - reported ; uses CPAP Negative for: Aspiration; Asthma; Chronic Obstructive Pulmonary Disease (COPD); Pneumothorax; Tuberculosis Cardiovascular Medical History: Positive for: Coronary Artery Disease - reported; Hypertension - reported Negative for: Angina; Arrhythmia; Congestive Heart Failure; Deep Vein Thrombosis; Hypotension; Myocardial Infarction; Peripheral Arterial Disease; Peripheral Venous Disease; Phlebitis;  Vasculitis Past Medical History Notes: blockages 2011 Gastrointestinal Medical History: Negative for: Cirrhosis ; Colitis; Crohns; Hepatitis A; Hepatitis B; Hepatitis C Endocrine Medical History: Positive for: Type II Diabetes - 2009 Negative for: Type I Diabetes Past Medical History Notes: wears monitor in skin that  checks CBG constantly Time with diabetes: 2009 Treated with: Insulin Blood sugar tested every day: No Blood sugar testing results: Breakfast: 90-120 Genitourinary Medical History: Negative for: End Stage Renal Disease Immunological Medical History: Negative for: Lupus Erythematosus; Raynauds; Scleroderma Integumentary (Skin) Medical History: Negative for: History of Burn Musculoskeletal Medical History: Positive for: Osteoarthritis - knees Negative for: Gout; Rheumatoid Arthritis; Osteomyelitis Neurologic Medical History: Positive for: Neuropathy - minor Negative for: Dementia; Quadriplegia; Paraplegia; Seizure Disorder Oncologic Medical History: Negative for: Received Chemotherapy; Received Radiation Psychiatric Medical History: 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 Negative for: Anorexia/bulimia Immunizations Pneumococcal Vaccine: Received Pneumococcal Vaccination: No Immunization Notes: up to date on pneumonia shots per his PCP Implantable Devices None Hospitalization / Surgery History Type of Hospitalization/Surgery by pass heart urology stent ORIF of left 2nd toe Family and Social History Cancer: Yes - Mother; Heart Disease: Yes - Father; Hypertension: Yes - Father; Stroke: Yes - Siblings; Never smoker; Marital Status - Married; Alcohol Use: Rarely - beer; Drug Use: No History; Caffeine Use: Daily; Financial Concerns: No; Food, Clothing or Shelter Needs: No; Support System Lacking: No; Transportation Concerns: No Electronic Signature(s) Signed: 05/30/2021 10:46:05 AM By: Geralyn Corwin DO Signed: 05/30/2021  5:19:06 PM By: Antonieta Iba Entered By: Geralyn Corwin on 05/30/2021 10:43:05 -------------------------------------------------------------------------------- SuperBill Details Patient Name: Date of Service: [name] [name] 05/30/2021 Medical Record Number: [id number] Patient Account Number: [id number] Date of Birth/Sex: Treating RN: 1956/07/10 (65 y.o. 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 L97.519 Non-pressure chronic ulcer of other part of right foot with unspecified severity E11.621 Type 2 diabetes mellitus with foot ulcer E11.40 Type 2 diabetes mellitus with diabetic neuropathy, unspecified Facility Procedures CPT4 Code: 84132440 Description: 11042 - DEB SUBQ TISSUE 20 SQ CM/< ICD-10 Diagnosis Description L97.529 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 L97.519 Non-pressure chronic ulcer of other part of right foot with unspecified severity Modifier: Quantity: 1 CPT4 Code: [id number] Description: 97597 - DEBRIDE WOUND 1ST 20 SQ CM OR < ICD-10 Diagnosis Description L97.529 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 L97.519 Non-pressure chronic ulcer of other part of right foot with unspecified  severity Modifier: Quantity: 1 Physician Procedures : CPT4 Code Description Modifier [id number] 11042 - WC PHYS SUBQ TISS 20 SQ CM ICD-10 Diagnosis Description L97.529 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

## 2021-05-31 NOTE — Progress Notes (Signed)
[name], [name] ([id number]) Visit Report for 05/30/2021 Arrival Information Details Patient Name: Date of Service: [name], [name] 05/30/2021 8:30 A M Medical Record Number: [id number] Patient Account Number: [id number] Date of Birth/Sex: Treating RN: [birth year] (65 y.o. [name] [name], [name] [name] Primary Care Trayvon Trumbull: Tally Joe Other Clinician: Referring Manmeet Arzola: Treating Nathanyl Andujo/Extender: Andrey Campanile in Treatment: 2 Visit Information History Since Last Visit Added or deleted any medications: No Patient Arrived: Ambulatory Any new allergies or adverse reactions: No Arrival Time: 08:43 Had a fall or experienced change in No Accompanied By: self activities of daily living that may affect Transfer Assistance: None risk of falls: Patient Identification Verified: Yes Signs or symptoms of abuse/neglect since last visito No Secondary Verification Process Completed: Yes Hospitalized since last visit: No Patient Requires Transmission-Based Precautions: No Implantable device outside of the clinic excluding No Patient Has Alerts: No cellular tissue based products placed in the center since last visit: Has Dressing in Place as Prescribed: Yes Pain Present Now: No Electronic Signature(s) Signed: 05/30/2021 5:12:13 PM By: Fonnie Mu RN Entered By: Fonnie Mu on 05/30/2021 08:43:50 -------------------------------------------------------------------------------- Encounter Discharge Information Details Patient Name: Date of Service: [name] [name]. 05/30/2021 8:30 A M Medical Record Number: [id number] Patient Account Number: [id number] Date of Birth/Sex: Treating RN: Jul 01, 1956 (65 y.o. [name] [name] Primary Care Kodi Guerrera: Tally Joe Other Clinician: Referring Liane Tribbey: Treating Kirston Luty/Extender: Andrey Campanile in Treatment: 2 Encounter Discharge Information Items Post Procedure Vitals Discharge Condition:  Stable Temperature (F): 97.7 Ambulatory Status: Ambulatory Pulse (bpm): 74 Discharge Destination: Home Respiratory Rate (breaths/min): 17 Transportation: Private Auto Blood Pressure (mmHg): 160/97 Accompanied By: alone Schedule Follow-up Appointment: Yes Clinical Summary of Care: Patient Declined Electronic Signature(s) Signed: 05/31/2021 5:43:47 PM By: Zandra Abts RN, BSN Entered By: Zandra Abts on 05/30/2021 12:44:48 -------------------------------------------------------------------------------- Lower Extremity Assessment Details Patient Name: Date of Service: [name] [name]. 05/30/2021 8:30 A M Medical Record Number: [id number] Patient Account Number: [id number] Date of Birth/Sex: Treating RN: [birth year] (65 y.o. [name] [name], [name] [name] Primary Care [name] [name]: Tally Joe Other Clinician: Referring Ambriana Selway: Treating Coleby Yett/Extender: Andrey Campanile in Treatment: 2 Edema Assessment Assessed: Kyra Searles: Yes] Franne Forts: Yes] Edema: [Left: Yes] [Right: Yes] Calf Left: Right: Point of Measurement: 34 cm From Medial Instep 44 cm 42 cm Ankle Left: Right: Point of Measurement: 10 cm From Medial Instep 23.5 cm 23 cm Vascular Assessment Pulses: Dorsalis Pedis Palpable: [Left:Yes] [Right:Yes] Posterior Tibial Palpable: [Left:Yes] [Right:Yes] Electronic Signature(s) Signed: 05/30/2021 5:12:13 PM By: Fonnie Mu RN Entered By: Fonnie Mu on 05/30/2021 08:46:52 -------------------------------------------------------------------------------- Multi Wound Chart Details Patient Name: Date of Service: [name] [name]. 05/30/2021 8:30 A M Medical Record Number: [id number] Patient Account Number: [id number] Date of Birth/Sex: Treating RN: 10-20-1956 (65 y.o. [name] [name] Primary Care Marion Seese: Tally Joe Other Clinician: Referring Teriah Muela: Treating Janie Capp/Extender: Andrey Campanile in Treatment: 2 Vital  Signs Height(in): 72 Capillary Blood Glucose(mg/dl): 244 Weight(lbs): 010 Pulse(bpm): 74 Body Mass Index(BMI): 37 Blood Pressure(mmHg): 160/97 Temperature(F): 97.7 Respiratory Rate(breaths/min): 17 Photos: [2:No Photos Left T Great oe] [3:No Photos Right T Great oe] [4:No Photos Right T Second oe] Wound Location: [2:Gradually Appeared] [3:Gradually Appeared] [4:Gradually Appeared] Wounding Event: [2:Diabetic Wound/Ulcer of the Lower] [3:Diabetic Wound/Ulcer of the Lower] [4:Diabetic Wound/Ulcer of the Lower] Primary Etiology: [2:Extremity Sleep Apnea, Coronary Artery] [3:Extremity Sleep Apnea, Coronary Artery] [4:Extremity Sleep Apnea, Coronary Artery] Comorbid History: [2:Disease, Hypertension, Type II Diabetes, Osteoarthritis, Neuropathy, Diabetes, Osteoarthritis, Neuropathy, Diabetes, Osteoarthritis, Neuropathy, Confinement Anxiety 03/20/2021] [3:Disease,  Hypertension, Type II Confinement Anxiety  07/21/2020] [4:Disease, Hypertension, Type II Confinement Anxiety 05/02/2021] Date Acquired: [2:2] [3:2] [4:2] Weeks of Treatment: [2:Open] [3:Open] [4:Open] Wound Status: [2:0.8x0.8x0.3] [3:1x0.4x0.21] [4:0.3x0.4x0.2] Measurements L x W x D (cm) [2:0.503] [3:0.314] [4:0.094] A (cm) : rea [2:0.151] [3:0.066] [4:0.019] Volume (cm) : [2:67.20%] [3:70.40%] [4:52.00%] % Reduction in A [2:rea: 50.70%] [3:84.40%] [4:51.30%] % Reduction in Volume: [2:Grade 2] [3:Grade 1] [4:Grade 2] Classification: [2:Medium] [3:Medium] [4:Medium] Exudate A mount: [2:Serosanguineous] [3:Serosanguineous] [4:Serosanguineous] Exudate Type: [2:red, brown] [3:red, brown] [4:red, brown] Exudate Color: [2:Thickened] [3:Thickened] [4:Distinct, outline attached] Wound Margin: [2:Large (67-100%)] [3:Large (67-100%)] [4:Large (67-100%)] Granulation A mount: [2:Red, Pink] [3:Red] [4:Red, Pink] Granulation Quality: [2:Small (1-33%)] [3:Small (1-33%)] [4:None Present (0%)] Necrotic A mount: [2:Fat Layer (Subcutaneous Tissue):  Yes Fat Layer (Subcutaneous Tissue): Yes Fat Layer (Subcutaneous Tissue): Yes] Exposed Structures: [2:Fascia: No Tendon: No Muscle: No Joint: No Bone: No None] [3:Fascia: No Tendon: No Muscle: No Joint: No Bone: No None] [4:Fascia: No Tendon: No Muscle: No Joint: No Bone: No None] Epithelialization: [2:Debridement - Excisional] [3:Debridement - Excisional] [4:Debridement - Selective/Open Wound] Debridement: Pre-procedure Verification/Time Out 09:31 [3:09:31] [4:09:31] Taken: [2:Other] [3:Other] [4:Other] Pain Control: [2:Callus, Subcutaneous] [3:Callus, Subcutaneous] [4:Callus] Tissue Debrided: [2:Skin/Subcutaneous Tissue] [3:Skin/Subcutaneous Tissue] [4:Non-Viable Tissue] Level: [2:0.64] [3:0.4] [4:0.12] Debridement A (sq cm): [2:rea Curette] [3:Curette] [4:Curette] Instrument: [2:Minimum] [3:Minimum] [4:Minimum] Bleeding: [2:Pressure] [3:Pressure] [4:Pressure] Hemostasis A chieved: [2:Procedure was tolerated well] [3:Procedure was tolerated well] [4:Procedure was tolerated well] Debridement Treatment Response: [2:0.8x0.8x0.3] [3:1x0.4x0.2] [4:0.3x0.4x0.2] Post Debridement Measurements L x W x D (cm) [2:0.151] [3:0.063] [4:0.019] Post Debridement Volume: (cm) [2:Debridement] [3:Debridement] [4:Debridement] Treatment Notes Electronic Signature(s) Signed: 05/30/2021 10:46:05 AM By: Geralyn Corwin DO Signed: 05/30/2021 5:19:06 PM By: Antonieta Iba Entered By: Geralyn Corwin on 05/30/2021 10:41:59 -------------------------------------------------------------------------------- Multi-Disciplinary Care Plan Details Patient Name: Date of Service: [name] [name]. 05/30/2021 8:30 A M Medical Record Number: [id number] Patient Account Number: [id number] Date of Birth/Sex: Treating RN: 1955-11-22 (65 y.o. [name] [name] Primary Care Paulyne Mooty: Tally Joe Other Clinician: Referring Rozetta Stumpp: Treating Cheray Pardi/Extender: Andrey Campanile in Treatment: 2 Active  Inactive Nutrition Nursing Diagnoses: Impaired glucose control: actual or potential Goals: Patient/caregiver verbalizes understanding of need to maintain therapeutic glucose control per primary care physician Date Initiated: 05/16/2021 Target Resolution Date: 06/13/2021 Goal Status: Active Interventions: Provide education on elevated blood sugars and impact on wound healing Provide education on nutrition Notes: Wound/Skin Impairment Nursing Diagnoses: Impaired tissue integrity Goals: Patient/caregiver will verbalize understanding of skin care regimen Date Initiated: 05/16/2021 Target Resolution Date: 06/13/2021 Goal Status: Active Ulcer/skin breakdown will have a volume reduction of 30% by week 4 Date Initiated: 05/16/2021 Target Resolution Date: 06/13/2021 Goal Status: Active Interventions: Assess patient/caregiver ability to obtain necessary supplies Assess patient/caregiver ability to perform ulcer/skin care regimen upon admission and as needed Assess ulceration(s) every visit Provide education on ulcer and skin care Treatment Activities: Skin care regimen initiated : 05/16/2021 Topical wound management initiated : 05/16/2021 Notes: Electronic Signature(s) Signed: 05/30/2021 5:19:06 PM By: Antonieta Iba Entered By: Antonieta Iba on 05/30/2021 09:42:24 -------------------------------------------------------------------------------- Pain Assessment Details Patient Name: Date of Service: [name] [name] 05/30/2021 8:30 A M Medical Record Number: [id number] Patient Account Number: [id number] Date of Birth/Sex: Treating RN: [birth year] (65 y.o. Lucious Groves Primary Care Aundray Cartlidge: Tally Joe Other Clinician: Referring Shamecca Whitebread: Treating Allexus Ovens/Extender: Andrey Campanile in Treatment: 2 Active Problems Location of Pain Severity and Description of Pain Patient Has Paino No Site Locations Pain Management and Medication Current Pain  Management: Electronic Signature(s)  Signed: 05/30/2021 5:12:13 PM By: Fonnie MuBreedlove, [name] RN Entered By: Fonnie MuBreedlove, [name] [name] on 05/30/2021 08:45:07 -------------------------------------------------------------------------------- Patient/Caregiver Education Details Patient Name: Date of Service: Bulkley, WILLIA M C. 7/11/2022andnbsp8:30 A M Medical Record Number: [id number] Patient Account Number: [id number] Date of Birth/Gender: Treating RN: Apr 03, 1956 (65 y.o. [name] MichaelsM) Barnhart, Jodi Primary Care Physician: Tally JoeSwayne, David Other Clinician: Referring Physician: Treating Physician/Extender: Andrey CampanileHoffman, Jessica Swayne, David Weeks in Treatment: 2 Education Assessment Education Provided To: Patient Education Topics Provided Offloading: Methods: Explain/Verbal, Printed Responses: State content correctly Wound/Skin Impairment: Methods: Explain/Verbal, Printed Responses: State content correctly Electronic Signature(s) Signed: 05/30/2021 5:19:06 PM By: Antonieta IbaBarnhart, Jodi Entered By: Antonieta IbaBarnhart, Jodi on 05/30/2021 09:42:52 -------------------------------------------------------------------------------- Wound Assessment Details Patient Name: Date of Service: [name] HaltUSH, WILLIA M C. 05/30/2021 8:30 A M Medical Record Number: [id number] Patient Account Number: [id number] Date of Birth/Sex: Treating RN: Apr 03, 1956 (65 y.o. [name] MerlM) Breedlove, [name] [name] Primary Care Kennice Finnie: Tally JoeSwayne, David Other Clinician: Referring Jeptha Hinnenkamp: Treating Ellenora Talton/Extender: Andrey CampanileHoffman, Jessica Swayne, David Weeks in Treatment: 2 Wound Status Wound Number: 2 Primary Diabetic Wound/Ulcer of the Lower Extremity Etiology: Wound Location: Left T Great oe Wound Open Wounding Event: Gradually Appeared Status: Date Acquired: 03/20/2021 Comorbid Sleep Apnea, Coronary Artery Disease, Hypertension, Type II Weeks Of Treatment: 2 History: Diabetes, Osteoarthritis, Neuropathy, Confinement Anxiety Clustered Wound: No Photos Photo Uploaded By: Karl Itoawkins,  Destiny on 05/31/2021 16:40:14 Wound Measurements Length: (cm) 0.8 Width: (cm) 0.8 Depth: (cm) 0.3 Area: (cm) 0.503 Volume: (cm) 0.151 % Reduction in Area: 67.2% % Reduction in Volume: 50.7% Epithelialization: None Tunneling: No Undermining: No Wound Description Classification: Grade 2 Wound Margin: Thickened Exudate Amount: Medium Exudate Type: Serosanguineous Exudate Color: red, brown Foul Odor After Cleansing: No Slough/Fibrino No Wound Bed Granulation Amount: Large (67-100%) Exposed Structure Granulation Quality: Red, Pink Fascia Exposed: No Necrotic Amount: Small (1-33%) Fat Layer (Subcutaneous Tissue) Exposed: Yes Necrotic Quality: Adherent Slough Tendon Exposed: No Muscle Exposed: No Joint Exposed: No Bone Exposed: No Treatment Notes Wound #2 (Toe Great) Wound Laterality: Left Cleanser Soap and Water Discharge Instruction: May shower and wash wound with dial antibacterial soap and water prior to dressing change. Peri-Wound Care Topical Primary Dressing KerraCel Ag Gelling Fiber Dressing, 2x2 in (silver alginate) Discharge Instruction: Apply silver alginate to wound bed as instructed Secondary Dressing Woven Gauze Sponges 2x2 in Discharge Instruction: Apply over primary dressing as directed. Optifoam Non-Adhesive Dressing, 4x4 in Discharge Instruction: Apply as donut over dressing Secured With Conforming Stretch Gauze Bandage, Sterile 2x75 (in/in) Discharge Instruction: Secure with stretch gauze as directed. Transpore Surgical Tape, 2x10 (in/yd) Discharge Instruction: Secure dressing with tape as directed. Compression Wrap Compression Stockings Add-Ons Electronic Signature(s) Signed: 05/30/2021 5:12:13 PM By: Fonnie MuBreedlove, [name] RN Entered By: Fonnie MuBreedlove, [name] [name] on 05/30/2021 08:49:16 -------------------------------------------------------------------------------- Wound Assessment Details Patient Name: Date of Service: [name] HaltUSH, WILLIA M C. 05/30/2021 8:30  A M Medical Record Number: [id number] Patient Account Number: [id number] Date of Birth/Sex: Treating RN: Apr 03, 1956 (65 y.o. [name] MerlM) Breedlove, [name] [name] Primary Care Omie Ferger: Tally JoeSwayne, David Other Clinician: Referring Santresa Levett: Treating Skyann Ganim/Extender: Andrey CampanileHoffman, Jessica Swayne, David Weeks in Treatment: 2 Wound Status Wound Number: 3 Primary Diabetic Wound/Ulcer of the Lower Extremity Etiology: Wound Location: Right T Great oe Wound Open Wounding Event: Gradually Appeared Status: Date Acquired: 07/21/2020 Comorbid Sleep Apnea, Coronary Artery Disease, Hypertension, Type II Weeks Of Treatment: 2 History: Diabetes, Osteoarthritis, Neuropathy, Confinement Anxiety Clustered Wound: No Photos Photo Uploaded By: Karl Itoawkins, Destiny on 05/31/2021 16:40:14 Wound Measurements Length: (cm) 1 Width: (cm) 0.4 Depth: (cm) 0.21 Area: (cm) 0.314 Volume: (cm) 0.066 % Reduction in Area: 70.4% % Reduction in  Volume: 84.4% Epithelialization: None Tunneling: No Undermining: No Wound Description Classification: Grade 1 Wound Margin: Thickened Exudate Amount: Medium Exudate Type: Serosanguineous Exudate Color: red, brown Foul Odor After Cleansing: No Slough/Fibrino No Wound Bed Granulation Amount: Large (67-100%) Exposed Structure Granulation Quality: Red Fascia Exposed: No Necrotic Amount: Small (1-33%) Fat Layer (Subcutaneous Tissue) Exposed: Yes Necrotic Quality: Adherent Slough Tendon Exposed: No Muscle Exposed: No Joint Exposed: No Bone Exposed: No Treatment Notes Wound #3 (Toe Great) Wound Laterality: Right Cleanser Soap and Water Discharge Instruction: May shower and wash wound with dial antibacterial soap and water prior to dressing change. Peri-Wound Care Topical Primary Dressing KerraCel Ag Gelling Fiber Dressing, 2x2 in (silver alginate) Discharge Instruction: Apply silver alginate to wound bed as instructed Secondary Dressing Woven Gauze Sponges 2x2 in Discharge  Instruction: Apply over primary dressing as directed. Optifoam Non-Adhesive Dressing, 4x4 in Discharge Instruction: Apply as donut over dressing Secured With Conforming Stretch Gauze Bandage, Sterile 2x75 (in/in) Discharge Instruction: Secure with stretch gauze as directed. Transpore Surgical Tape, 2x10 (in/yd) Discharge Instruction: Secure dressing with tape as directed. Compression Wrap Compression Stockings Add-Ons Electronic Signature(s) Signed: 05/30/2021 5:12:13 PM By: Fonnie Mu RN Entered By: Fonnie Mu on 05/30/2021 08:48:09 -------------------------------------------------------------------------------- Wound Assessment Details Patient Name: Date of Service: [name] [name] 05/30/2021 8:30 A M Medical Record Number: [id number] Patient Account Number: [id number] Date of Birth/Sex: Treating RN: [birth year] (65 y.o. [name] [name], [name] [name] Primary Care Tamecka Milham: Tally Joe Other Clinician: Referring Jermia Rigsby: Treating Derrico Zhong/Extender: Andrey Campanile in Treatment: 2 Wound Status Wound Number: 4 Primary Diabetic Wound/Ulcer of the Lower Extremity Etiology: Wound Location: Right T Second oe Wound Open Wounding Event: Gradually Appeared Status: Date Acquired: 05/02/2021 Comorbid Sleep Apnea, Coronary Artery Disease, Hypertension, Type II Weeks Of Treatment: 2 History: Diabetes, Osteoarthritis, Neuropathy, Confinement Anxiety Clustered Wound: No Photos Photo Uploaded By: Karl Ito on 05/31/2021 16:40:33 Wound Measurements Length: (cm) 0.3 Width: (cm) 0.4 Depth: (cm) 0.2 Area: (cm) 0.094 Volume: (cm) 0.019 % Reduction in Area: 52% % Reduction in Volume: 51.3% Epithelialization: None Tunneling: No Undermining: No Wound Description Classification: Grade 2 Wound Margin: Distinct, outline attached Exudate Amount: Medium Exudate Type: Serosanguineous Exudate Color: red, brown Foul Odor After Cleansing:  No Slough/Fibrino No Wound Bed Granulation Amount: Large (67-100%) Exposed Structure Granulation Quality: Red, Pink Fascia Exposed: No Necrotic Amount: None Present (0%) Fat Layer (Subcutaneous Tissue) Exposed: Yes Tendon Exposed: No Muscle Exposed: No Joint Exposed: No Bone Exposed: No Treatment Notes Wound #4 (Toe Second) Wound Laterality: Right Cleanser Soap and Water Discharge Instruction: May shower and wash wound with dial antibacterial soap and water prior to dressing change. Peri-Wound Care Topical Primary Dressing KerraCel Ag Gelling Fiber Dressing, 2x2 in (silver alginate) Discharge Instruction: Apply silver alginate to wound bed as instructed Secondary Dressing Woven Gauze Sponges 2x2 in Discharge Instruction: Apply over primary dressing as directed. Optifoam Non-Adhesive Dressing, 4x4 in Discharge Instruction: Apply as donut over dressing Secured With Conforming Stretch Gauze Bandage, Sterile 2x75 (in/in) Discharge Instruction: Secure with stretch gauze as directed. Transpore Surgical Tape, 2x10 (in/yd) Discharge Instruction: Secure dressing with tape as directed. Compression Wrap Compression Stockings Add-Ons Electronic Signature(s) Signed: 05/30/2021 5:12:13 PM By: Fonnie Mu RN Entered By: Fonnie Mu on 05/30/2021 08:48:48 -------------------------------------------------------------------------------- Vitals Details Patient Name: Date of Service: [name] [name]. 05/30/2021 8:30 A M Medical Record Number: [id number] Patient Account Number: [id number] Date of Birth/Sex: Treating RN: 09-15-1956 (65 y.o. Lucious Groves Primary Care Nayeliz Hipp: Tally Joe Other Clinician: Referring Charels Stambaugh: Treating Laquan Ludden/Extender:  Andrey Campanile in Treatment: 2 Vital Signs Time Taken: 08:43 Temperature (F): 97.7 Height (in): 72 Pulse (bpm): 74 Weight (lbs): 271 Respiratory Rate (breaths/min): 17 Weight (lbs):  271 Respiratory Rate (breaths/min): 17 Body Mass Index (BMI): 36.8 Blood Pressure (mmHg): 160/97 Capillary Blood Glucose (mg/dl): 700 Reference Range: 80 - 120 mg / dl Electronic Signature(s) Signed: 05/30/2021 5:12:13 PM By: Fonnie Mu RN Entered By: Fonnie Mu on 05/30/2021 08:44:39

## 2021-06-06 ENCOUNTER — Encounter (HOSPITAL_BASED_OUTPATIENT_CLINIC_OR_DEPARTMENT_OTHER): Payer: 59 | Admitting: Internal Medicine

## 2021-06-06 ENCOUNTER — Other Ambulatory Visit: Payer: Self-pay

## 2021-06-06 DIAGNOSIS — E11621 Type 2 diabetes mellitus with foot ulcer: Secondary | ICD-10-CM | POA: Diagnosis not present

## 2021-06-06 DIAGNOSIS — L97519 Non-pressure chronic ulcer of other part of right foot with unspecified severity: Secondary | ICD-10-CM

## 2021-06-06 DIAGNOSIS — L97529 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: Secondary | ICD-10-CM | POA: Diagnosis not present

## 2021-06-06 NOTE — Progress Notes (Signed)
[name] [name], [name] [name] ([id number]) Visit Report for 06/06/2021 Arrival Information Details Patient Name: Date of Service: [name] [name], [name] [name] 06/06/2021 8:15 A M Medical Record Number: [id number] Patient Account Number: [id number] Date of Birth/Sex: Treating RN: [birth year] [name] (65 y.o. [name] [name], [name] [name] Primary Care [name] [name]: [name] [name] Other Clinician: Referring Soledad Budreau: Treating [name] [name]/Extender: [name] [name] in Treatment: 3 Visit Information History Since Last Visit Added or deleted any medications: No Patient Arrived: Ambulatory Any new allergies or adverse reactions: No Arrival Time: 09:55 Had a fall or experienced change in No Accompanied By: self activities of daily living that may affect Transfer Assistance: None risk of falls: Patient Identification Verified: Yes Signs or symptoms of abuse/neglect since last visito No Secondary Verification Process Completed: Yes Hospitalized since last visit: No Patient Requires Transmission-Based Precautions: No Implantable device outside of the clinic excluding No Patient Has Alerts: No cellular tissue based products placed in the center since last visit: Pain Present Now: No Electronic Signature(s) Signed: 06/06/2021 5:08:20 PM By: Fonnie Mu RN Entered By: Fonnie Mu on 06/06/2021 09:56:21 -------------------------------------------------------------------------------- Encounter Discharge Information Details Patient Name: Date of Service: [name] [name] 06/06/2021 8:15 A M Medical Record Number: [id number] Patient Account Number: [id number] Date of Birth/Sex: Treating RN: [name] [name] (65 y.o. [name] [name] Primary Care Audreyanna Butkiewicz: [name] [name] Other Clinician: Referring Mariela Rex: Treating Hoorain Kozakiewicz/Extender: [name] [name] in Treatment: 3 Encounter Discharge Information Items Post Procedure Vitals Discharge Condition: Stable Temperature (F): 98.4 Ambulatory Status:  Ambulatory Pulse (bpm): 80 Discharge Destination: Home Respiratory Rate (breaths/min): 18 Transportation: Private Auto Blood Pressure (mmHg): 157/93 Accompanied By: self Schedule Follow-up Appointment: Yes Clinical Summary of Care: Patient Declined Electronic Signature(s) Signed: 06/06/2021 5:28:31 PM By: Zenaida Deed RN, BSN Entered By: Zenaida Deed on 06/06/2021 13:11:56 -------------------------------------------------------------------------------- Lower Extremity Assessment Details Patient Name: Date of Service: [name] [name], [name] [name] 06/06/2021 8:15 A M Medical Record Number: [id number] Patient Account Number: [id number] Date of Birth/Sex: Treating RN: Mar [birth year], [name] (65 y.o. [name] [name], [name] [name] Primary Care [name] Schweppe: [name] [name] Other Clinician: Referring Eudell Julian: Treating Bridey Brookover/Extender: [name] [name] in Treatment: 3 Edema Assessment Assessed: Kyra Searles: Yes] Franne Forts: Yes] Edema: [Left: Yes] [Right: Yes] Calf Left: Right: Point of Measurement: 34 cm From Medial Instep 45 cm 43 cm Ankle Left: Right: Point of Measurement: 10 cm From Medial Instep 23.5 cm 23 cm Vascular Assessment Pulses: Dorsalis Pedis Palpable: [Left:Yes] [Right:Yes] Posterior Tibial Palpable: [Left:Yes] [Right:Yes] Electronic Signature(s) Signed: 06/06/2021 5:08:20 PM By: Fonnie Mu RN Entered By: Fonnie Mu on 06/06/2021 09:57:16 -------------------------------------------------------------------------------- Multi Wound Chart Details Patient Name: Date of Service: [name] [name] 06/06/2021 8:15 A M Medical Record Number: [id number] Patient Account Number: [id number] Date of Birth/Sex: Treating RN: [birth year] [name] (65 y.o. [name] [name] Primary Care [name] [name]: [name] [name] Other Clinician: Referring Zlatan Hornback: Treating Amaal Dimartino/Extender: [name] [name] in Treatment: 3 Vital Signs Height(in): 72 Capillary Blood Glucose(mg/dl):  509 Weight(lbs): 326 Pulse(bpm): 80 Body Mass Index(BMI): 37 Blood Pressure(mmHg): 157/93 Temperature(F): 98.4 Respiratory Rate(breaths/min): 16 Photos: [2:No Photos Left T Great oe] [N/A:N/A N/A] Wound Location: [2:Gradually Appeared] [N/A:N/A] Wounding Event: [2:Diabetic Wound/Ulcer of the Lower] [N/A:N/A] Primary Etiology: [2:Extremity 03/20/2021] [N/A:N/A] Date Acquired: [2:3] [N/A:N/A] Weeks of Treatment: [2:Open] [N/A:N/A] Wound Status: [2:1x1x0.5] [N/A:N/A] Measurements L x W x D (cm) [2:0.785] [N/A:N/A] A (cm) : rea [2:0.393] [N/A:N/A] Volume (cm) : [2:48.80%] [N/A:N/A] % Reduction in Area: [2:-28.40%] [N/A:N/A] % Reduction in Volume: [2:Grade 2] [N/A:N/A] Treatment Notes Electronic Signature(s) Signed: 06/06/2021 5:04:52 PM By: [name] Abts RN, BSN Signed:  06/06/2021 5:31:06 PM By: [name] [name], [name] [name] Entered By: [name] [name], [name] on 06/06/2021 09:57:41 -------------------------------------------------------------------------------- Multi-Disciplinary Care Plan Details Patient Name: Date of Service: [name] HaltRUSH, WILLIA M C. 06/06/2021 8:15 A M Medical Record Number: [id number] Patient Account Number: [id number] Date of Birth/Sex: Treating RN: [birth year][name] (65 y.o. [name] [name]) [name] [name] Primary Care [name] [name]: [name] JoeSwayne, [name] Other Clinician: Referring [name] [name]: Treating [name] [name]/Extender: [name] CampanileHoffman, [name] [name] [name] Weeks in Treatment: 3 Active Inactive Nutrition Nursing Diagnoses: Impaired glucose control: actual or potential Goals: Patient/caregiver verbalizes understanding of need to maintain therapeutic glucose control per primary care physician Date Initiated: 05/16/2021 Target Resolution Date: 06/13/2021 Goal Status: Active Interventions: Provide education on elevated blood sugars and impact on wound healing Provide education on nutrition Notes: Wound/Skin Impairment Nursing Diagnoses: Impaired tissue integrity Goals: Patient/caregiver will verbalize  understanding of skin care regimen Date Initiated: 05/16/2021 Target Resolution Date: 06/13/2021 Goal Status: Active Ulcer/skin breakdown will have a volume reduction of 30% by week 4 Date Initiated: 05/16/2021 Target Resolution Date: 06/13/2021 Goal Status: Active Interventions: Assess patient/caregiver ability to obtain necessary supplies Assess patient/caregiver ability to perform ulcer/skin care regimen upon admission and as needed Assess ulceration(s) every visit Provide education on ulcer and skin care Treatment Activities: Skin care regimen initiated : 05/16/2021 Topical wound management initiated : 05/16/2021 Notes: Electronic Signature(s) Signed: 06/06/2021 5:04:52 PM By: [name] AbtsLynch, Shatara RN, BSN Entered By: [name] AbtsLynch, [name] on 06/06/2021 13:33:00 -------------------------------------------------------------------------------- Pain Assessment Details Patient Name: Date of Service: [name] HaltUSH, WILLIA M C. 06/06/2021 8:15 A M Medical Record Number: [id number] Patient Account Number: [id number] Date of Birth/Sex: Treating RN: [birth year][name] (65 y.o. [name] MerlM) Breedlove, [name] [name] Primary Care Tashika Goodin: [name] JoeSwayne, [name] Other Clinician: Referring Kaylina Cahue: Treating Khailee Mick/Extender: [name] CampanileHoffman, [name] [name] [name] Weeks in Treatment: 3 Active Problems Location of Pain Severity and Description of Pain Patient Has Paino No Site Locations Pain Management and Medication Current Pain Management: Electronic Signature(s) Signed: 06/06/2021 5:08:20 PM By: Fonnie MuBreedlove, [name] RN Entered By: Fonnie MuBreedlove, [name] [name] on 06/06/2021 09:56:53 -------------------------------------------------------------------------------- Patient/Caregiver Education Details Patient Name: Date of Service: [name] HaltUSH, WILLIA M C. 7/18/2022andnbsp8:15 A M Medical Record Number: [id number] Patient Account Number: [id number] Date of Birth/Gender: Treating RN: [birth year][name] (65 y.o. [name] [name]) [name] [name] Primary Care Physician: [name] JoeSwayne, [name] Other  Clinician: Referring Physician: Treating Physician/Extender: [name] CampanileHoffman, [name] [name] [name] Weeks in Treatment: 3 Education Assessment Education Provided To: Patient Education Topics Provided Wound/Skin Impairment: Methods: Explain/Verbal Responses: State content correctly Electronic Signature(s) Signed: 06/06/2021 5:04:52 PM By: [name] AbtsLynch, Shatara RN, BSN Entered By: [name] AbtsLynch, [name] on 06/06/2021 13:33:10 -------------------------------------------------------------------------------- Wound Assessment Details Patient Name: Date of Service: [name] HaltUSH, WILLIA M C. 06/06/2021 8:15 A M Medical Record Number: [id number] Patient Account Number: [id number] Date of Birth/Sex: Treating RN: [birth year]-[name] (65 y.o. [name] MerlM) Breedlove, [name] [name] Primary Care Sherika Kubicki: [name] JoeSwayne, [name] Other Clinician: Referring Zyaire Dumas: Treating Broady Lafoy/Extender: [name] CampanileHoffman, [name] [name] [name] Weeks in Treatment: 3 Wound Status Wound Number: 2 Primary Diabetic Wound/Ulcer of the Lower Extremity Etiology: Wound Location: Left T Great oe Wound Open Wounding Event: Gradually Appeared Status: Date Acquired: 03/20/2021 Comorbid Sleep Apnea, Coronary Artery Disease, Hypertension, Type II Weeks Of Treatment: 3 History: Diabetes, Osteoarthritis, Neuropathy, Confinement Anxiety Clustered Wound: No Photos Photo Uploaded By: Karl Itoawkins, Destiny on 06/06/2021 15:24:09 Wound Measurements Length: (cm) 1 Width: (cm) 1 Depth: (cm) 0.5 Area: (cm) 0.785 Volume: (cm) 0.393 % Reduction in Area: 48.8% % Reduction in Volume: -28.4% Epithelialization: None Tunneling: No Undermining: Yes Starting Position (o'clock): 12 Ending Position (o'clock): 12 Maximum Distance: (cm) 0.7 Wound Description Classification: Grade 2 Wound Margin: Thickened Exudate Amount: Medium Exudate Type: Serosanguineous Exudate Color: red, brown  Foul Odor After Cleansing: No Slough/Fibrino No Wound Bed Granulation Amount: Large (67-100%) Exposed  Structure Granulation Quality: Red, Pink Fascia Exposed: No Necrotic Amount: Small (1-33%) Fat Layer (Subcutaneous Tissue) Exposed: Yes Necrotic Quality: Adherent Slough Tendon Exposed: No Muscle Exposed: No Joint Exposed: No Bone Exposed: No Treatment Notes Wound #2 (Toe Great) Wound Laterality: Left Cleanser Soap and Water Discharge Instruction: May shower and wash wound with dial antibacterial soap and water prior to dressing change. Peri-Wound Care Topical Primary Dressing KerraCel Ag Gelling Fiber Dressing, 2x2 in (silver alginate) Discharge Instruction: Apply silver alginate to wound bed as instructed Secondary Dressing Woven Gauze Sponges 2x2 in Discharge Instruction: Apply over primary dressing as directed. Optifoam Non-Adhesive Dressing, 4x4 in Discharge Instruction: Apply as donut over dressing Secured With Conforming Stretch Gauze Bandage, Sterile 2x75 (in/in) Discharge Instruction: Secure with stretch gauze as directed. Transpore Surgical Tape, 2x10 (in/yd) Discharge Instruction: Secure dressing with tape as directed. Compression Wrap Compression Stockings Add-Ons Electronic Signature(s) Signed: 06/06/2021 5:08:20 PM By: Fonnie Mu RN Entered By: Fonnie Mu on 06/06/2021 09:57:51 -------------------------------------------------------------------------------- Wound Assessment Details Patient Name: Date of Service: [name] [name] 06/06/2021 8:15 A M Medical Record Number: [id number] Patient Account Number: [id number] Date of Birth/Sex: Treating RN: [birth year] (65 y.o. [name] [name], [name] [name] Primary Care Eutha Cude: [name] [name] Other Clinician: Referring Maleeyah Mccaughey: Treating Lakyra Tippins/Extender: [name] [name] in Treatment: 3 Wound Status Wound Number: 3 Primary Diabetic Wound/Ulcer of the Lower Extremity Etiology: Wound Location: Right T Great oe Wound Open Wounding Event: Gradually Appeared Status: Date Acquired:  07/21/2020 Comorbid Sleep Apnea, Coronary Artery Disease, Hypertension, Type II Weeks Of Treatment: 3 History: Diabetes, Osteoarthritis, Neuropathy, Confinement Anxiety Clustered Wound: No Photos Photo Uploaded By: Karl Ito on 06/06/2021 15:23:45 Wound Measurements Length: (cm) 1.3 Width: (cm) 0.5 Depth: (cm) 0.3 Area: (cm) 0.511 Volume: (cm) 0.153 % Reduction in Area: 51.8% % Reduction in Volume: 63.9% Epithelialization: None Tunneling: No Undermining: Yes Starting Position (o'clock): 12 Ending Position (o'clock): 6 Maximum Distance: (cm) 0.5 Wound Description Classification: Grade 2 Wound Margin: Thickened Exudate Amount: Medium Exudate Type: Serosanguineous Exudate Color: red, brown Foul Odor After Cleansing: No Slough/Fibrino No Wound Bed Granulation Amount: Large (67-100%) Exposed Structure Granulation Quality: Red Fascia Exposed: No Necrotic Amount: Small (1-33%) Fat Layer (Subcutaneous Tissue) Exposed: Yes Necrotic Quality: Adherent Slough Tendon Exposed: No Muscle Exposed: No Joint Exposed: No Bone Exposed: No Treatment Notes Wound #3 (Toe Great) Wound Laterality: Right Cleanser Soap and Water Discharge Instruction: May shower and wash wound with dial antibacterial soap and water prior to dressing change. Peri-Wound Care Topical Primary Dressing KerraCel Ag Gelling Fiber Dressing, 2x2 in (silver alginate) Discharge Instruction: Apply silver alginate to wound bed as instructed Secondary Dressing Woven Gauze Sponges 2x2 in Discharge Instruction: Apply over primary dressing as directed. Optifoam Non-Adhesive Dressing, 4x4 in Discharge Instruction: Apply as donut over dressing Secured With Conforming Stretch Gauze Bandage, Sterile 2x75 (in/in) Discharge Instruction: Secure with stretch gauze as directed. Transpore Surgical Tape, 2x10 (in/yd) Discharge Instruction: Secure dressing with tape as directed. Compression Wrap Compression  Stockings Add-Ons Electronic Signature(s) Signed: 06/06/2021 5:08:20 PM By: Fonnie Mu RN Entered By: Fonnie Mu on 06/06/2021 09:58:25 -------------------------------------------------------------------------------- Wound Assessment Details Patient Name: Date of Service: [name] [name] 06/06/2021 8:15 A M Medical Record Number: [id number] Patient Account Number: [id number] Date of Birth/Sex: Treating RN: [birth year] (65 y.o. [name] [name], [name] [name] Primary Care Jalayiah Bibian: [name] [name] Other Clinician: Referring Desare Duddy: Treating Humza Tallerico/Extender: [name] [name] in Treatment: 3 Wound Status Wound  Number: 4 Primary Diabetic Wound/Ulcer of the Lower Extremity Etiology: Wound Location: Right T Second oe Wound Open Wounding Event: Gradually Appeared Status: Date Acquired: 05/02/2021 Comorbid Sleep Apnea, Coronary Artery Disease, Hypertension, Type II Weeks Of Treatment: 3 History: Diabetes, Osteoarthritis, Neuropathy, Confinement Anxiety Clustered Wound: No Photos Photo Uploaded By: Karl Ito on 06/06/2021 15:23:46 Wound Measurements Length: (cm) 0.5 Width: (cm) 1.5 Depth: (cm) 0.2 Area: (cm) 0.589 Volume: (cm) 0.118 % Reduction in Area: -200.5% % Reduction in Volume: -202.6% Epithelialization: None Tunneling: No Undermining: No Wound Description Classification: Grade 2 Wound Margin: Distinct, outline attached Exudate Amount: Medium Exudate Type: Serosanguineous Exudate Color: red, brown Foul Odor After Cleansing: No Slough/Fibrino No Wound Bed Granulation Amount: Large (67-100%) Exposed Structure Granulation Quality: Red, Pink Fascia Exposed: No Necrotic Amount: None Present (0%) Fat Layer (Subcutaneous Tissue) Exposed: Yes Tendon Exposed: No Muscle Exposed: No Joint Exposed: No Bone Exposed: No Treatment Notes Wound #4 (Toe Second) Wound Laterality: Right Cleanser Soap and Water Discharge Instruction: May  shower and wash wound with dial antibacterial soap and water prior to dressing change. Peri-Wound Care Topical Primary Dressing KerraCel Ag Gelling Fiber Dressing, 2x2 in (silver alginate) Discharge Instruction: Apply silver alginate to wound bed as instructed Secondary Dressing Woven Gauze Sponges 2x2 in Discharge Instruction: Apply over primary dressing as directed. Optifoam Non-Adhesive Dressing, 4x4 in Discharge Instruction: Apply as donut over dressing Secured With Conforming Stretch Gauze Bandage, Sterile 2x75 (in/in) Discharge Instruction: Secure with stretch gauze as directed. Transpore Surgical Tape, 2x10 (in/yd) Discharge Instruction: Secure dressing with tape as directed. Compression Wrap Compression Stockings Add-Ons Electronic Signature(s) Signed: 06/06/2021 5:08:20 PM By: Fonnie Mu RN Entered By: Fonnie Mu on 06/06/2021 09:58:47 -------------------------------------------------------------------------------- Vitals Details Patient Name: Date of Service: [name] [name] 06/06/2021 8:15 A M Medical Record Number: [id number] Patient Account Number: [id number] Date of Birth/Sex: Treating RN: [birth year]-[name] (65 y.o. [name] [name], [name] [name] Primary Care Dorette Hartel: [name] [name] Other Clinician: Referring Kalleigh Harbor: Treating Johnisha Louks/Extender: [name] [name] in Treatment: 3 Vital Signs Time Taken: 08:15 Temperature (F): 98.4 Height (in): 72 Pulse (bpm): 80 Weight (lbs): 271 Respiratory Rate (breaths/min): 16 Body Mass Index (BMI): 36.8 Blood Pressure (mmHg): 157/93 Capillary Blood Glucose (mg/dl): 315 Reference Range: 80 - 120 mg / dl Electronic Signature(s) Signed: 06/06/2021 5:08:20 PM By: Fonnie Mu RN Entered By: Fonnie Mu on 06/06/2021 09:56:49

## 2021-06-06 NOTE — Progress Notes (Signed)
[name] [name], [name] [name] ([id number]) Visit Report for 06/06/2021 Chief Complaint Document Details Patient Name: Date of Service: [name] [name] 06/06/2021 8:15 A M Medical Record Number: [id number] Patient Account Number: [id number] Date of Birth/Sex: Treating RN: 03-12-1956 (65 y.o. [name] Primary Care Provider: Tally Joe Other Clinician: Referring Provider: Treating Provider/Extender: Andrey Campanile in Treatment: 3 Information Obtained from: Patient Chief Complaint Bilateral toe wounds Electronic Signature(s) Signed: 06/06/2021 5:31:06 PM By: Geralyn Corwin DO Entered By: Geralyn Corwin on 06/06/2021 09:57:50 -------------------------------------------------------------------------------- Debridement Details Patient Name: Date of Service: [name] [name] 06/06/2021 8:15 A M Medical Record Number: [id number] Patient Account Number: [id number] Date of Birth/Sex: Treating RN: May 25, 1956 (65 y.o. [name] Primary Care Provider: Tally Joe Other Clinician: Referring Provider: Treating Provider/Extender: Andrey Campanile in Treatment: 3 Debridement Performed for Assessment: Wound #2 Left T Great oe Performed By: Physician Geralyn Corwin, DO Debridement Type: Debridement Severity of Tissue Pre Debridement: Fat layer exposed Level of Consciousness (Pre-procedure): Awake and Alert Pre-procedure Verification/Time Out Yes - 08:45 Taken: Start Time: 08:45 T Area Debrided (L x W): otal 1 (cm) x 1 (cm) = 1 (cm) Tissue and other material debrided: Viable, Non-Viable, Callus, Subcutaneous Level: Skin/Subcutaneous Tissue Debridement Description: Excisional Instrument: Curette Bleeding: Minimum Hemostasis Achieved: Pressure End Time: 08:47 Procedural Pain: 0 Post Procedural Pain: 0 Response to Treatment: Procedure was tolerated well Level of Consciousness (Post- Awake and Alert procedure): Post Debridement  Measurements of Total Wound Length: (cm) 1 Width: (cm) 1 Depth: (cm) 0.5 Volume: (cm) 0.393 Character of Wound/Ulcer Post Debridement: Improved Severity of Tissue Post Debridement: Fat layer exposed Post Procedure Diagnosis Same as Pre-procedure Electronic Signature(s) Signed: 06/06/2021 5:04:52 PM By: Zandra Abts RN, BSN Signed: 06/06/2021 5:31:06 PM By: Geralyn Corwin DO Entered By: Zandra Abts on 06/06/2021 10:44:10 -------------------------------------------------------------------------------- Debridement Details Patient Name: Date of Service: [name] [name] 06/06/2021 8:15 A M Medical Record Number: [id number] Patient Account Number: [id number] Date of Birth/Sex: Treating RN: 1956/04/18 (65 y.o. [name] Primary Care Provider: Tally Joe Other Clinician: Referring Provider: Treating Provider/Extender: Andrey Campanile in Treatment: 3 Debridement Performed for Assessment: Wound #3 Right T Great oe Performed By: Physician Geralyn Corwin, DO Debridement Type: Debridement Severity of Tissue Pre Debridement: Fat layer exposed Level of Consciousness (Pre-procedure): Awake and Alert Pre-procedure Verification/Time Out Yes - 08:45 Taken: Start Time: 08:45 T Area Debrided (L x W): otal 1.3 (cm) x 0.5 (cm) = 0.65 (cm) Tissue and other material debrided: Viable, Non-Viable, Callus, Subcutaneous Level: Skin/Subcutaneous Tissue Debridement Description: Excisional Instrument: Curette Bleeding: Minimum Hemostasis Achieved: Pressure End Time: 08:47 Procedural Pain: 0 Post Procedural Pain: 0 Response to Treatment: Procedure was tolerated well Level of Consciousness (Post- Awake and Alert procedure): Post Debridement Measurements of Total Wound Length: (cm) 1.3 Width: (cm) 0.5 Depth: (cm) 0.3 Volume: (cm) 0.153 Character of Wound/Ulcer Post Debridement: Improved Severity of Tissue Post Debridement: Fat layer exposed Post  Procedure Diagnosis Same as Pre-procedure Electronic Signature(s) Signed: 06/06/2021 5:04:52 PM By: Zandra Abts RN, BSN Signed: 06/06/2021 5:31:06 PM By: Geralyn Corwin DO Entered By: Zandra Abts on 06/06/2021 10:44:38 -------------------------------------------------------------------------------- Debridement Details Patient Name: Date of Service: [name] [name] 06/06/2021 8:15 A M Medical Record Number: [id number] Patient Account Number: [id number] Date of Birth/Sex: Treating RN: 11/12/1956 (65 y.o. [name] Primary Care Provider: Tally Joe Other Clinician: Referring Provider: Treating Provider/Extender: Andrey Campanile in Treatment: 3 Debridement Performed for Assessment: Wound #4 Right  T Second oe Performed By: Physician Geralyn Corwin, DO Debridement Type: Debridement Severity of Tissue Pre Debridement: Fat layer exposed Level of Consciousness (Pre-procedure): Awake and Alert Pre-procedure Verification/Time Out Yes - 08:45 Taken: Start Time: 08:45 T Area Debrided (L x W): otal 0.5 (cm) x 1.5 (cm) = 0.75 (cm) Tissue and other material debrided: Viable, Non-Viable, Callus, Subcutaneous Level: Skin/Subcutaneous Tissue Debridement Description: Excisional Instrument: Curette Bleeding: Minimum Hemostasis Achieved: Pressure End Time: 08:47 Procedural Pain: 0 Post Procedural Pain: 0 Response to Treatment: Procedure was tolerated well Level of Consciousness (Post- Awake and Alert procedure): Post Debridement Measurements of Total Wound Length: (cm) 0.5 Width: (cm) 1.5 Depth: (cm) 0.2 Volume: (cm) 0.118 Character of Wound/Ulcer Post Debridement: Improved Severity of Tissue Post Debridement: Fat layer exposed Post Procedure Diagnosis Same as Pre-procedure Electronic Signature(s) Signed: 06/06/2021 5:04:52 PM By: Zandra Abts RN, BSN Signed: 06/06/2021 5:31:06 PM By: Geralyn Corwin DO Entered By: Zandra Abts on 06/06/2021  10:45:02 -------------------------------------------------------------------------------- HPI Details Patient Name: Date of Service: [name] [name] 06/06/2021 8:15 A M Medical Record Number: [id number] Patient Account Number: [id number] Date of Birth/Sex: Treating RN: 05/08/1956 (65 y.o. [name] Primary Care Provider: Tally Joe Other Clinician: Referring Provider: Treating Provider/Extender: Andrey Campanile in Treatment: 3 History of Present Illness HPI Description: Admission 6/27 [name] is a 65 year old male with a past medical history of insulin-dependent type 2 diabetes, CABG in 2011 and essential hypertension that presents to the clinic for bilateral toe wounds. He has wounds to his left great toe that started in May 2022. He also has wounds to his right great toe and second toe that started in September 2021. He noticed they started as blisters and eventually worsened. He is currently using antibiotic ointment and keeping them covered. He reports minimal pain but has decreased sensation to his feet bilaterally. He denies signs of infection. 7/5; patient presents for 1 week follow-up. He did not receive supplies from our wound supplier [facility]. He ordered some silver alginate on [employer] and has been using this to the wound beds. He has also been aggressively offloading the areas. Overall he reports doing well and would like to hold off on the total contact cast since there has been improvement to the wound beds. He currently denies signs of infection. 7/11; patient presents for 1 week follow-up. He has been using silver alginate to the wound beds but taking his dressing off at night. He would like to continue using his offloading shoe and hold off on the cast for now. He currently denies signs of infection. 7/18; patient presents for 1 week follow-up. He has been using silver alginate to the wound beds. He has no issues or complaints today. Been  using his offloading shoe to the right foot. He denies signs of infection. Electronic Signature(s) Signed: 06/06/2021 5:31:06 PM By: Geralyn Corwin DO Entered By: Geralyn Corwin on 06/06/2021 09:58:29 -------------------------------------------------------------------------------- Physical Exam Details Patient Name: Date of Service: BREVON, DEWALD 06/06/2021 8:15 A M Medical Record Number: [id number] Patient Account Number: [id number] Date of Birth/Sex: Treating RN: 04-24-1956 (65 y.o. [name] Primary Care Provider: Tally Joe Other Clinician: Referring Provider: Treating Provider/Extender: Andrey Campanile in Treatment: 3 Constitutional respirations regular, non-labored and within target range for patient.. Cardiovascular 2+ dorsalis pedis/posterior tibialis pulses. Psychiatric pleasant and cooperative. Notes Right foot: Great toe and second toe have open wounds with nonviable tissue, callus and granulation tissue present no signs of infection. Left foot: [employer]  toe has an open wound with nonviable tissue callus and granulation tissue present. No signs of infection. Electronic Signature(s) Signed: 06/06/2021 5:31:06 PM By: Geralyn Corwin DO Entered By: Geralyn Corwin on 06/06/2021 10:00:55 -------------------------------------------------------------------------------- Physician Orders Details Patient Name: Date of Service: [name] [name] 06/06/2021 8:15 A M Medical Record Number: [id number] Patient Account Number: [id number] Date of Birth/Sex: Treating RN: July 23, 1956 (65 y.o. [name] Primary Care Provider: Tally Joe Other Clinician: Referring Provider: Treating Provider/Extender: Andrey Campanile in Treatment: 3 Verbal / Phone Orders: No Diagnosis Coding ICD-10 Coding Code Description [phone] 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 L97.519 Non-pressure  chronic ulcer of other part of right foot with unspecified severity E11.621 Type 2 diabetes mellitus with foot ulcer E11.40 Type 2 diabetes mellitus with diabetic neuropathy, unspecified Follow-up Appointments ppointment in 1 week. - with Dr. Mikey Bussing Monday Return A Other: - [employer] for Supplies Bathing/ Shower/ Hygiene May shower and wash wound with soap and water. Edema Control - Lymphedema / SCD / Other Avoid standing for long periods of time. Off-Loading Wedge shoe to: - front offloader on left foot Additional Orders / Instructions Follow Nutritious Diet - -Increase protein, 100-120g daily -Keep blood sugars monitored Wound Treatment Wound #2 - T Great oe Wound Laterality: Left Cleanser: Soap and Water 1 x Per Day/15 Days Discharge Instructions: May shower and wash wound with dial antibacterial soap and water prior to dressing change. Prim Dressing: KerraCel Ag Gelling Fiber Dressing, 2x2 in (silver alginate) 1 x Per Day/15 Days ary Discharge Instructions: Apply silver alginate to wound bed as instructed Secondary Dressing: Woven Gauze Sponges 2x2 in 1 x Per Day/15 Days Discharge Instructions: Apply over primary dressing as directed. Secondary Dressing: Optifoam Non-Adhesive Dressing, 4x4 in 1 x Per Day/15 Days Discharge Instructions: Apply as donut over dressing Secured With: Conforming Stretch Gauze Bandage, Sterile 2x75 (in/in) 1 x Per Day/15 Days Discharge Instructions: Secure with stretch gauze as directed. Secured With: Transpore Surgical Tape, 2x10 (in/yd) 1 x Per Day/15 Days Discharge Instructions: Secure dressing with tape as directed. Wound #3 - T Great oe Wound Laterality: Right Cleanser: Soap and Water 1 x Per Day/15 Days Discharge Instructions: May shower and wash wound with dial antibacterial soap and water prior to dressing change. Prim Dressing: KerraCel Ag Gelling Fiber Dressing, 2x2 in (silver alginate) 1 x Per Day/15 Days ary Discharge Instructions:  Apply silver alginate to wound bed as instructed Secondary Dressing: Woven Gauze Sponges 2x2 in 1 x Per Day/15 Days Discharge Instructions: Apply over primary dressing as directed. Secondary Dressing: Optifoam Non-Adhesive Dressing, 4x4 in 1 x Per Day/15 Days Discharge Instructions: Apply as donut over dressing Secured With: Conforming Stretch Gauze Bandage, Sterile 2x75 (in/in) 1 x Per Day/15 Days Discharge Instructions: Secure with stretch gauze as directed. Secured With: Transpore Surgical Tape, 2x10 (in/yd) 1 x Per Day/15 Days Discharge Instructions: Secure dressing with tape as directed. Wound #4 - T Second oe Wound Laterality: Right Cleanser: Soap and Water 1 x Per Day/15 Days Discharge Instructions: May shower and wash wound with dial antibacterial soap and water prior to dressing change. Prim Dressing: KerraCel Ag Gelling Fiber Dressing, 2x2 in (silver alginate) 1 x Per Day/15 Days ary Discharge Instructions: Apply silver alginate to wound bed as instructed Secondary Dressing: Woven Gauze Sponges 2x2 in 1 x Per Day/15 Days Discharge Instructions: Apply over primary dressing as directed. Secondary Dressing: Optifoam Non-Adhesive Dressing, 4x4 in 1 x Per Day/15 Days Discharge  Instructions: Apply as donut over dressing Secured With: Conforming Stretch Gauze Bandage, Sterile 2x75 (in/in) 1 x Per Day/15 Days Discharge Instructions: Secure with stretch gauze as directed. Secured With: Transpore Surgical Tape, 2x10 (in/yd) 1 x Per Day/15 Days Discharge Instructions: Secure dressing with tape as directed. Radiology X-ray, left and right foot - Non healing wounds on bilateral great toes - (ICD10 E11.621 - Type 2 diabetes mellitus with foot ulcer) Electronic Signature(s) Signed: 06/06/2021 5:31:06 PM By: Geralyn Corwin DO Previous Signature: 06/06/2021 5:04:52 PM Version By: Zandra Abts RN, BSN Entered By: Geralyn Corwin on 06/06/2021 17:26:23 Prescription  06/06/2021 -------------------------------------------------------------------------------- Georga Bora C. Geralyn Corwin DO Patient Name: Provider: [birth year] [id number] Date of Birth: NPI#: Judie Petit [id number] Sex: DEA #: [phone] Phone #: License #: Eligha Bridegroom [facility] Patient Address: [address] [place], [place] [address] [address] [place], [place] [address] [phone] Allergies No Known Allergies Provider's Orders X-ray, left and right foot - ICD10: E11.621 - Non healing wounds on bilateral great toes Hand Signature: Date(s): Electronic Signature(s) Signed: 06/06/2021 5:31:06 PM By: Geralyn Corwin DO Previous Signature: 06/06/2021 5:04:52 PM Version By: Zandra Abts RN, BSN Entered By: Geralyn Corwin on 06/06/2021 17:26:24 -------------------------------------------------------------------------------- Problem List Details Patient Name: Date of Service: [name] [name] 06/06/2021 8:15 A M Medical Record Number: [id number] Patient Account Number: [id number] Date of Birth/Sex: Treating RN: Jul 12, 1956 (65 y.o. [name] Primary Care Provider: Tally Joe Other Clinician: Referring Provider: Treating Provider/Extender: Andrey Campanile in Treatment: 3 Active Problems ICD-10 Encounter Code Description Active Date MDM Diagnosis L97.529 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 05/16/2021 No Yes L97.519 Non-pressure chronic ulcer of other part of right foot with unspecified severity 05/16/2021 No Yes E11.621 Type 2 diabetes mellitus with foot ulcer 05/17/2021 No Yes E11.40 Type 2 diabetes mellitus with diabetic neuropathy, unspecified 05/17/2021 No Yes Inactive Problems Resolved Problems Electronic Signature(s) Signed: 06/06/2021 5:31:06 PM By: Geralyn Corwin DO Entered By: Geralyn Corwin on 06/06/2021  09:57:34 -------------------------------------------------------------------------------- Progress Note Details Patient Name: Date of Service: [name] [name] 06/06/2021 8:15 A M Medical Record Number: [id number] Patient Account Number: [id number] Date of Birth/Sex: Treating RN: 1956-03-16 (65 y.o. [name] Primary Care Provider: Tally Joe Other Clinician: Referring Provider: Treating Provider/Extender: Andrey Campanile in Treatment: 3 Subjective Chief Complaint Information obtained from Patient Bilateral toe wounds History of Present Illness (HPI) Admission 6/27 Mr. Kishaun Erekson is a 65 year old male with a past medical history of insulin-dependent type 2 diabetes, CABG in 2011 and essential hypertension that presents to the clinic for bilateral toe wounds. He has wounds to his left great toe that started in May 2022. He also has wounds to his right great toe and second toe that started in September 2021. He noticed they started as blisters and eventually worsened. He is currently using antibiotic ointment and keeping them covered. He reports minimal pain but has decreased sensation to his feet bilaterally. He denies signs of infection. 7/5; patient presents for 1 week follow-up. He did not receive supplies from our wound supplier [facility]. He ordered some silver alginate on [employer] and has been using this to the wound beds. He has also been aggressively offloading the areas. Overall he reports doing well and would like to hold off on the total contact cast since there has been improvement to the wound beds. He currently denies signs of infection. 7/11; patient presents for  1 week follow-up. He has been using silver alginate to the wound beds but taking his dressing off at night. He would like to continue using his offloading shoe and hold off on the cast for now. He currently denies signs of infection. 7/18; patient presents for 1 week follow-up. He has been  using silver alginate to the wound beds. He has no issues or complaints today. Been using his offloading shoe to the right foot. He denies signs of infection. Patient History Information obtained from Patient. Family History Cancer - Mother, Heart Disease - Father, Hypertension - Father, Stroke - Siblings. Social History Never smoker, Marital Status - Married, Alcohol Use - Rarely - beer, Drug Use - No History, Caffeine Use - Daily. Medical History Eyes Denies history of Cataracts, Glaucoma, Optic Neuritis Hematologic/Lymphatic Denies history of Anemia, Hemophilia, Human Immunodeficiency Virus, Lymphedema, Sickle Cell Disease Respiratory Patient has history of Sleep Apnea - reported ; uses CPAP Denies history of Aspiration, Asthma, Chronic Obstructive Pulmonary Disease (COPD), Pneumothorax, Tuberculosis Cardiovascular Patient has history of Coronary Artery Disease - reported, Hypertension - reported Denies history of Angina, Arrhythmia, Congestive Heart Failure, Deep Vein Thrombosis, Hypotension, Myocardial Infarction, Peripheral Arterial Disease, Peripheral Venous Disease, Phlebitis, Vasculitis Gastrointestinal Denies history of Cirrhosis , Colitis, Crohnoos, Hepatitis A, Hepatitis B, Hepatitis C Endocrine Patient has history of Type II Diabetes - 2009 Denies history of Type I Diabetes Genitourinary Denies history of End Stage Renal Disease Immunological Denies history of Lupus Erythematosus, Raynaudoos, Scleroderma Integumentary (Skin) Denies history of History of Burn Musculoskeletal Patient has history of Osteoarthritis - knees Denies history of Gout, Rheumatoid Arthritis, Osteomyelitis Neurologic Patient has history of Neuropathy - minor Denies history of Dementia, Quadriplegia, Paraplegia, Seizure Disorder Oncologic Denies history of Received Chemotherapy, Received Radiation Psychiatric Patient has history of Confinement Anxiety - situational with medical  issues Denies history of Anorexia/bulimia Hospitalization/Surgery History - by pass heart. - urology stent. - ORIF of left 2nd toe. Medical A Surgical History Notes nd Cardiovascular blockages 2011 Endocrine wears monitor in skin that checks CBG constantly Objective Constitutional respirations regular, non-labored and within target range for patient.. Vitals Time Taken: 8:15 AM, Height: 72 in, Weight: 271 lbs, BMI: 36.8, Temperature: 98.4 F, Pulse: 80 bpm, Respiratory Rate: 16 breaths/min, Blood Pressure: 157/93 mmHg, Capillary Blood Glucose: 180 mg/dl. Cardiovascular 2+ dorsalis pedis/posterior tibialis pulses. Psychiatric pleasant and cooperative. General Notes: Right foot: Great toe and second toe have open wounds with nonviable tissue, callus and granulation tissue present no signs of infection. Left foot: Great toe has an open wound with nonviable tissue callus and granulation tissue present. No signs of infection. Integumentary (Hair, Skin) Wound #2 status is Open. Original cause of wound was Gradually Appeared. The date acquired was: 03/20/2021. The wound has been in treatment 3 weeks. The wound is located on the Left T Great. The wound measures 1cm length x 1cm width x 0.5cm depth; 0.785cm^2 area and 0.393cm^3 volume. There is Fat oe Layer (Subcutaneous Tissue) exposed. There is no tunneling noted, however, there is undermining starting at 12:00 and ending at 12:00 with a maximum distance of 0.7cm. There is a medium amount of serosanguineous drainage noted. The wound margin is thickened. There is large (67-100%) red, pink granulation within the wound bed. There is a small (1-33%) amount of necrotic tissue within the wound bed including Adherent Slough. Wound #3 status is Open. Original cause of wound was Gradually Appeared. The date acquired was: 07/21/2020. The wound has been in treatment 3 weeks. The wound is  located on the Right T Great. The wound measures 1.3cm length x 0.5cm  width x 0.3cm depth; 0.511cm^2 area and 0.153cm^3 volume. There is Fat oe Layer (Subcutaneous Tissue) exposed. There is no tunneling noted, however, there is undermining starting at 12:00 and ending at 6:00 with a maximum distance of 0.5cm. There is a medium amount of serosanguineous drainage noted. The wound margin is thickened. There is large (67-100%) red granulation within the wound bed. There is a small (1-33%) amount of necrotic tissue within the wound bed including Adherent Slough. Wound #4 status is Open. Original cause of wound was Gradually Appeared. The date acquired was: 05/02/2021. The wound has been in treatment 3 weeks. The wound is located on the Right T Second. The wound measures 0.5cm length x 1.5cm width x 0.2cm depth; 0.589cm^2 area and 0.118cm^3 volume. There is oe Fat Layer (Subcutaneous Tissue) exposed. There is no tunneling or undermining noted. There is a medium amount of serosanguineous drainage noted. The wound margin is distinct with the outline attached to the wound base. There is large (67-100%) red, pink granulation within the wound bed. There is no necrotic tissue within the wound bed. Assessment Active Problems ICD-10 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 Non-pressure chronic ulcer of other part of right foot with unspecified severity Type 2 diabetes mellitus with foot ulcer Type 2 diabetes mellitus with diabetic neuropathy, unspecified Patient's wounds have increased slightly in size. There is significantly more callus present. I debrided nonviable tissue. We started using the front offloading shoe to the right foot because the wound looked slightly worse on that side compared to the left. Now the left toe wound looks slightly worse than the right and we will go ahead and give him a left front offloading shoe. I asked him to use this for the next week and stop the one on the right. We also rediscussed the cast and patient  was agreeable. Would like to place this on the left side at next clinic visit. Decided to go ahead and order an x-ray of both feet to assure that there is no signs of osteomyelitis. I cannot probe to bone on exam. This would be best to have before putting on the cast. I recommended continuing silver alginate to the wound beds. I will see him back in 1 week. Procedures Wound #2 Pre-procedure diagnosis of Wound #2 is a Diabetic Wound/Ulcer of the Lower Extremity located on the Left T Great .Severity of Tissue Pre Debridement is: oe Fat layer exposed. There was a Excisional Skin/Subcutaneous Tissue Debridement with a total area of 1 sq cm performed by Geralyn CorwinHoffman, Hayly Litsey, DO. With the following instrument(s): Curette to remove Viable and Non-Viable tissue/material. Material removed includes Callus and Subcutaneous Tissue and. No specimens were taken. A time out was conducted at 08:45, prior to the start of the procedure. A Minimum amount of bleeding was controlled with Pressure. The procedure was tolerated well with a pain level of 0 throughout and a pain level of 0 following the procedure. Post Debridement Measurements: 1cm length x 1cm width x 0.5cm depth; 0.393cm^3 volume. Character of Wound/Ulcer Post Debridement is improved. Severity of Tissue Post Debridement is: Fat layer exposed. Post procedure Diagnosis Wound #2: Same as Pre-Procedure Wound #3 Pre-procedure diagnosis of Wound #3 is a Diabetic Wound/Ulcer of the Lower Extremity located on the Right T Great .Severity of Tissue Pre Debridement is: oe Fat layer exposed. There was a Excisional Skin/Subcutaneous Tissue Debridement with a total  area of 0.65 sq cm performed by Geralyn Corwin, DO. With the following instrument(s): Curette to remove Viable and Non-Viable tissue/material. Material removed includes Callus and Subcutaneous Tissue and. No specimens were taken. A time out was conducted at 08:45, prior to the start of the procedure. A  Minimum amount of bleeding was controlled with Pressure. The procedure was tolerated well with a pain level of 0 throughout and a pain level of 0 following the procedure. Post Debridement Measurements: 1.3cm length x 0.5cm width x 0.3cm depth; 0.153cm^3 volume. Character of Wound/Ulcer Post Debridement is improved. Severity of Tissue Post Debridement is: Fat layer exposed. Post procedure Diagnosis Wound #3: Same as Pre-Procedure Wound #4 Pre-procedure diagnosis of Wound #4 is a Diabetic Wound/Ulcer of the Lower Extremity located on the Right T Second .Severity of Tissue Pre Debridement oe is: Fat layer exposed. There was a Excisional Skin/Subcutaneous Tissue Debridement with a total area of 0.75 sq cm performed by Geralyn Corwin, DO. With the following instrument(s): Curette to remove Viable and Non-Viable tissue/material. Material removed includes Callus and Subcutaneous Tissue and. No specimens were taken. A time out was conducted at 08:45, prior to the start of the procedure. A Minimum amount of bleeding was controlled with Pressure. The procedure was tolerated well with a pain level of 0 throughout and a pain level of 0 following the procedure. Post Debridement Measurements: 0.5cm length x 1.5cm width x 0.2cm depth; 0.118cm^3 volume. Character of Wound/Ulcer Post Debridement is improved. Severity of Tissue Post Debridement is: Fat layer exposed. Post procedure Diagnosis Wound #4: Same as Pre-Procedure Plan Follow-up Appointments: Return Appointment in 1 week. - with Dr. Mikey Bussing Monday Other: - [facility]) for Supplies Bathing/ Shower/ Hygiene: May shower and wash wound with soap and water. Edema Control - Lymphedema / SCD / Other: Avoid standing for long periods of time. Off-Loading: Wedge shoe to: - front offloader on left foot Additional Orders / Instructions: Follow Nutritious Diet - -Increase protein, 100-120g daily -Keep blood sugars monitored Radiology ordered were: X-ray,  left and right foot - Non healing wounds on bilateral great toes WOUND #2: - T Great Wound Laterality: Left oe Cleanser: Soap and Water 1 x Per Day/15 Days Discharge Instructions: May shower and wash wound with dial antibacterial soap and water prior to dressing change. Prim Dressing: KerraCel Ag Gelling Fiber Dressing, 2x2 in (silver alginate) 1 x Per Day/15 Days ary Discharge Instructions: Apply silver alginate to wound bed as instructed Secondary Dressing: Woven Gauze Sponges 2x2 in 1 x Per Day/15 Days Discharge Instructions: Apply over primary dressing as directed. Secondary Dressing: Optifoam Non-Adhesive Dressing, 4x4 in 1 x Per Day/15 Days Discharge Instructions: Apply as donut over dressing Secured With: Conforming Stretch Gauze Bandage, Sterile 2x75 (in/in) 1 x Per Day/15 Days Discharge Instructions: Secure with stretch gauze as directed. Secured With: Transpore Surgical T ape, 2x10 (in/yd) 1 x Per Day/15 Days Discharge Instructions: Secure dressing with tape as directed. WOUND #3: - T Great Wound Laterality: Right oe Cleanser: Soap and Water 1 x Per Day/15 Days Discharge Instructions: May shower and wash wound with dial antibacterial soap and water prior to dressing change. Prim Dressing: KerraCel Ag Gelling Fiber Dressing, 2x2 in (silver alginate) 1 x Per Day/15 Days ary Discharge Instructions: Apply silver alginate to wound bed as instructed Secondary Dressing: Woven Gauze Sponges 2x2 in 1 x Per Day/15 Days Discharge Instructions: Apply over primary dressing as directed. Secondary Dressing: Optifoam Non-Adhesive Dressing, 4x4 in 1 x Per Day/15 Days Discharge Instructions: Apply  as donut over dressing Secured With: Conforming Stretch Gauze Bandage, Sterile 2x75 (in/in) 1 x Per Day/15 Days Discharge Instructions: Secure with stretch gauze as directed. Secured With: Transpore Surgical T ape, 2x10 (in/yd) 1 x Per Day/15 Days Discharge Instructions: Secure dressing with tape as  directed. WOUND #4: - T Second Wound Laterality: Right oe Cleanser: Soap and Water 1 x Per Day/15 Days Discharge Instructions: May shower and wash wound with dial antibacterial soap and water prior to dressing change. Prim Dressing: KerraCel Ag Gelling Fiber Dressing, 2x2 in (silver alginate) 1 x Per Day/15 Days ary Discharge Instructions: Apply silver alginate to wound bed as instructed Secondary Dressing: Woven Gauze Sponges 2x2 in 1 x Per Day/15 Days Discharge Instructions: Apply over primary dressing as directed. Secondary Dressing: Optifoam Non-Adhesive Dressing, 4x4 in 1 x Per Day/15 Days Discharge Instructions: Apply as donut over dressing Secured With: Conforming Stretch Gauze Bandage, Sterile 2x75 (in/in) 1 x Per Day/15 Days Discharge Instructions: Secure with stretch gauze as directed. Secured With: Transpore Surgical Tape, 2x10 (in/yd) 1 x Per Day/15 Days Discharge Instructions: Secure dressing with tape as directed. 1. In office sharp debridement 2. Silver alginate 3. Offloading shoe to the left foot and can stop using the offloading shoe on the right foot 4. X-rays bilaterally of the feet 5. Follow-up in 1 week for cast placement Electronic Signature(s) Signed: 06/06/2021 5:31:06 PM By: Geralyn Corwin DO Previous Signature: 06/06/2021 5:04:52 PM Version By: Zandra Abts RN, BSN Entered By: Geralyn Corwin on 06/06/2021 17:26:35 -------------------------------------------------------------------------------- HxROS Details Patient Name: Date of Service: [name] [name] 06/06/2021 8:15 A M Medical Record Number: [id number] Patient Account Number: [id number] Date of Birth/Sex: Treating RN: November 17, 1956 (65 y.o. [name] Primary Care Provider: Tally Joe Other Clinician: Referring Provider: Treating Provider/Extender: Andrey Campanile in Treatment: 3 Information Obtained From Patient Eyes Medical History: Negative for: Cataracts;  Glaucoma; Optic Neuritis Hematologic/Lymphatic Medical History: Negative for: Anemia; Hemophilia; Human Immunodeficiency Virus; Lymphedema; Sickle Cell Disease Respiratory Medical History: Positive for: Sleep Apnea - reported ; uses CPAP Negative for: Aspiration; Asthma; Chronic Obstructive Pulmonary Disease (COPD); Pneumothorax; Tuberculosis Cardiovascular Medical History: Positive for: Coronary Artery Disease - reported; Hypertension - reported Negative for: Angina; Arrhythmia; Congestive Heart Failure; Deep Vein Thrombosis; Hypotension; Myocardial Infarction; Peripheral Arterial Disease; Peripheral Venous Disease; Phlebitis; Vasculitis Past Medical History Notes: blockages 2011 Gastrointestinal Medical History: Negative for: Cirrhosis ; Colitis; Crohns; Hepatitis A; Hepatitis B; Hepatitis C Endocrine Medical History: Positive for: Type II Diabetes - 2009 Negative for: Type I Diabetes Past Medical History Notes: wears monitor in skin that checks CBG constantly Time with diabetes: 2009 Treated with: Insulin Blood sugar tested every day: No Blood sugar testing results: Breakfast: 90-120 Genitourinary Medical History: Negative for: End Stage Renal Disease Immunological Medical History: Negative for: Lupus Erythematosus; Raynauds; Scleroderma Integumentary (Skin) Medical History: Negative for: History of Burn Musculoskeletal Medical History: Positive for: Osteoarthritis - knees Negative for: Gout; Rheumatoid Arthritis; Osteomyelitis Neurologic Medical History: Positive for: Neuropathy - minor Negative for: Dementia; Quadriplegia; Paraplegia; Seizure Disorder Oncologic Medical History: Negative for: Received Chemotherapy; Received Radiation Psychiatric Medical History: 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 Negative for: Anorexia/bulimia Immunizations Pneumococcal Vaccine: Received Pneumococcal Vaccination: No Immunization Notes: up  to date on pneumonia shots per his PCP Implantable Devices None Hospitalization / Surgery History Type of Hospitalization/Surgery by pass heart urology stent ORIF of left 2nd toe Family and Social History Cancer: Yes - Mother; Heart Disease: Yes - Father; Hypertension: Yes -  Father; Stroke: Yes - Siblings; Never smoker; Marital Status - Married; Alcohol Use: Rarely - beer; Drug Use: No History; Caffeine Use: Daily; Financial Concerns: No; Food, Clothing or Shelter Needs: No; Support System Lacking: No; Transportation Concerns: No Electronic Signature(s) Signed: 06/06/2021 5:04:52 PM By: Zandra Abts RN, BSN Signed: 06/06/2021 5:31:06 PM By: Geralyn Corwin DO Entered By: Geralyn Corwin on 06/06/2021 09:58:46 -------------------------------------------------------------------------------- SuperBill Details Patient Name: Date of Service: [name] [name] 06/06/2021 Medical Record Number: [id number] Patient Account Number: [id number] Date of Birth/Sex: Treating RN: July 25, 1956 (65 y.o. 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 L97.519 Non-pressure chronic ulcer of other part of right foot with unspecified severity E11.621 Type 2 diabetes mellitus with foot ulcer E11.40 Type 2 diabetes mellitus with diabetic neuropathy, unspecified Facility Procedures CPT4 Code: [id number] Description: 11042 - DEB SUBQ TISSUE 20 SQ CM/< ICD-10 Diagnosis Description L97.529 Non-pressure chronic ulcer of other part of left foot with unspecified severit L97.519 Non-pressure chronic ulcer of other part of right foot with unspecified severi  E11.621 Type 2 diabetes mellitus with foot ulcer Modifier: y ty Quantity: 1 Physician Procedures :  CPT4 Code Description Modifier [id number] 11042 - WC PHYS SUBQ TISS 20 SQ CM ICD-10 Diagnosis Description L97.529 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

## 2021-06-09 NOTE — Progress Notes (Signed)
[name] [name], [name] [name] ([id number]) Visit Report for 05/16/2021 Abuse/Suicide Risk Screen Details Patient Name: Date of Service: [name] [name], [name] [name] 05/16/2021 9:00 A M Medical Record Number: [id number] Patient Account Number: [id number] Date of Birth/Sex: Treating RN: 05-Jan-1956 (65 y.o. [name] Primary Care Letishia Elliott: Tally Joe Other Clinician: Referring Tiler Brandis: Treating Doni Widmer/Extender: Andrey Campanile in Treatment: 0 Abuse/Suicide Risk Screen Items Answer ABUSE RISK SCREEN: Has anyone close to you tried to hurt or harm you recentlyo No Do you feel uncomfortable with anyone in your familyo No Has anyone forced you do things that you didnt want to doo No Electronic Signature(s) Signed: 06/08/2021 8:38:18 PM By: Shawn Stall Previous Signature: 06/06/2021 9:05:19 PM Version By: Shawn Stall Entered By: Shawn Stall on 06/08/2021 20:35:03 -------------------------------------------------------------------------------- Activities of Daily Living Details Patient Name: Date of Service: [name] [name], [name] [name] 05/16/2021 9:00 A M Medical Record Number: [id number] Patient Account Number: [id number] Date of Birth/Sex: Treating RN: 23-Jun-1956 (65 y.o. [name] Primary Care Terrika Zuver: Tally Joe Other Clinician: Referring Demond Shallenberger: Treating Thadius Smisek/Extender: Andrey Campanile in Treatment: 0 Activities of Daily Living Items Answer Activities of Daily Living (Please select one for each item) Drive Automobile Completely Able T Medications ake Completely Able Use T elephone Completely Able Care for Appearance Completely Able Use T oilet Completely Able Bath / Shower Completely Able Dress Self Completely Able Feed Self Completely Able Walk Completely Able Get In / Out Bed Completely Able Housework Completely Able Prepare Meals Completely Able Handle Money Completely Able Shop for Self Completely Able Electronic  Signature(s) Signed: 06/08/2021 8:38:18 PM By: Shawn Stall Previous Signature: 06/06/2021 9:05:19 PM Version By: Shawn Stall Entered By: Shawn Stall on 06/08/2021 20:35:10 -------------------------------------------------------------------------------- Education Screening Details Patient Name: Date of Service: [name] [name] 05/16/2021 9:00 A M Medical Record Number: [id number] Patient Account Number: [id number] Date of Birth/Sex: Treating RN: Jan 21, 1956 (65 y.o. [name] Primary Care Chip Canepa: Tally Joe Other Clinician: Referring Gelene Recktenwald: Treating Ellana Kawa/Extender: Andrey Campanile in Treatment: 0 Learning Preferences/Education Level/Primary Language Learning Preference: Printed Material Highest Education Level: College or Above Preferred Language: English Cognitive Barrier Language Barrier: No Translator Needed: No Memory Deficit: No Emotional Barrier: No Cultural/Religious Beliefs Affecting Medical Care: No Physical Barrier Impaired Vision: Yes Glasses, reading Impaired Hearing: No Decreased Hand dexterity: No Knowledge/Comprehension Knowledge Level: High Comprehension Level: High Ability to understand written instructions: High Ability to understand verbal instructions: High Motivation Anxiety Level: Calm Cooperation: Cooperative Education Importance: Acknowledges Need Interest in Health Problems: Asks Questions Perception: Coherent Willingness to Engage in Self-Management High Activities: Readiness to Engage in Self-Management High Activities: Electronic Signature(s) Signed: 06/08/2021 8:38:18 PM By: Shawn Stall Previous Signature: 06/06/2021 9:05:19 PM Version By: Shawn Stall Entered By: Shawn Stall on 06/08/2021 20:35:21 -------------------------------------------------------------------------------- Fall Risk Assessment Details Patient Name: Date of Service: [name] [name] 05/16/2021 9:00 A M Medical Record  Number: [id number] Patient Account Number: [id number] Date of Birth/Sex: Treating RN: October 13, 1956 (65 y.o. [name], [name] Primary Care Duyen Beckom: Tally Joe Other Clinician: Referring Dontavius Keim: Treating Armanii Pressnell/Extender: Andrey Campanile in Treatment: 0 Fall Risk Assessment Items Have you had 2 or more falls in the last 12 monthso 0 No Have you had any fall that resulted in injury in the last 12 monthso 0 No FALLS RISK SCREEN History of falling - immediate or within 3 months 0 No Secondary diagnosis (Do you have 2 or more medical diagnoseso) 0 No Ambulatory aid None/bed rest/wheelchair/nurse 0 No Crutches/cane/walker  0 No Furniture 0 No Intravenous therapy Access/Saline/Heparin Lock 0 No Gait/Transferring Normal/ bed rest/ wheelchair 0 No Weak (short steps with or without shuffle, stooped but able to lift head while walking, may seek 0 No support from furniture) Impaired (short steps with shuffle, may have difficulty arising from chair, head down, impaired 0 No balance) Mental Status Oriented to own ability 0 No Electronic Signature(s) Signed: 06/08/2021 8:38:18 PM By: Shawn Stall Previous Signature: 06/06/2021 9:05:19 PM Version By: Shawn Stall Entered By: Shawn Stall on 06/08/2021 20:35:27 -------------------------------------------------------------------------------- Foot Assessment Details Patient Name: Date of Service: [name] [name] 05/16/2021 9:00 A M Medical Record Number: [id number] Patient Account Number: [id number] Date of Birth/Sex: Treating RN: Apr 07, 1956 (65 y.o. [name] Primary Care Lejon Afzal: Tally Joe Other Clinician: Referring Nichola Warren: Treating Yamil Oelke/Extender: Andrey Campanile in Treatment: 0 Foot Assessment Items Site Locations + = Sensation present, - = Sensation absent, C = Callus, U = Ulcer R = Redness, W = Warmth, M = Maceration, PU = Pre-ulcerative lesion F = Fissure, S =  Swelling, D = Dryness Assessment Right: Left: Other Deformity: No No Prior Foot Ulcer: No No Prior Amputation: No No Charcot Joint: No No Ambulatory Status: Ambulatory Without Help Gait: Steady Electronic Signature(s) Signed: 06/08/2021 8:38:18 PM By: Shawn Stall Previous Signature: 06/06/2021 9:05:19 PM Version By: Shawn Stall Entered By: Shawn Stall on 06/08/2021 20:35:39 -------------------------------------------------------------------------------- Nutrition Risk Screening Details Patient Name: Date of Service: [name] [name], [name] [name] 05/16/2021 9:00 A M Medical Record Number: [id number] Patient Account Number: [id number] Date of Birth/Sex: Treating RN: Aug 28, 1956 (65 y.o. [name], [name] Primary Care Gesenia Bantz: Tally Joe Other Clinician: Referring Nazier Neyhart: Treating Lionel Woodberry/Extender: Andrey Campanile in Treatment: 0 Height (in): 72 Weight (lbs): 271 Body Mass Index (BMI): 36.8 Nutrition Risk Screening Items Score Screening NUTRITION RISK SCREEN: I have an illness or condition that made me change the kind and/or amount of food I eat 2 Yes I eat fewer than two meals per day 0 No I eat few fruits and vegetables, or milk products 0 No I have three or more drinks of beer, liquor or wine almost every day 0 No I have tooth or mouth problems that make it hard for me to eat 0 No I don't always have enough money to buy the food I need 0 No I eat alone most of the time 0 No I take three or more different prescribed or over-the-counter drugs a day 1 Yes Without wanting to, I have lost or gained 10 pounds in the last six months 0 No I am not always physically able to shop, cook and/or feed myself 0 No Nutrition Protocols Good Risk Protocol Moderate Risk Protocol High Risk Proctocol Risk Level: Moderate Risk Score: 3 Electronic Signature(s) Signed: 06/08/2021 8:38:18 PM By: Shawn Stall Previous Signature: 06/06/2021 9:05:19 PM Version By: Shawn Stall Entered By: Shawn Stall on 06/08/2021 20:35:32

## 2021-06-09 NOTE — Progress Notes (Signed)
MARGARITA, BOBROWSKI ([id number]) Visit Report for 05/16/2021 Allergy List Details Patient Name: Date of Service: PASQUAL, FARIAS 05/16/2021 9:00 A M Medical Record Number: [id number] Patient Account Number: [id number] Date of Birth/Sex: Treating RN: 10/28/1956 (65 y.o. [name] Primary Care Dawaun Brancato: Tally Joe Other Clinician: Referring Courtni Balash: Treating Tavien Chestnut/Extender: Andrey Campanile in Treatment: 0 Allergies Active Allergies No Known Allergies Allergy Notes Electronic Signature(s) Signed: 06/08/2021 8:38:18 PM By: Shawn Stall Previous Signature: 06/06/2021 9:05:19 PM Version By: Shawn Stall Entered By: Shawn Stall on 06/08/2021 20:34:41 -------------------------------------------------------------------------------- Arrival Information Details Patient Name: Date of Service: [name] 05/16/2021 9:00 A M Medical Record Number: [id number] Patient Account Number: [id number] Date of Birth/Sex: Treating RN: October 07, 1956 (65 y.o. [name], [name] Primary Care Marvon Shillingburg: Tally Joe Other Clinician: Referring Johnni Wunschel: Treating Monta Police/Extender: Andrey Campanile in Treatment: 0 Visit Information Patient Arrived: Ambulatory Arrival Time: 08:55 Accompanied By: alone Transfer Assistance: None Patient Identification Verified: Yes Secondary Verification Process Completed: Yes Patient Requires Transmission-Based Precautions: No Patient Has Alerts: No History Since Last Visit All ordered tests and consults were completed: No Added or deleted any medications: No Any new allergies or adverse reactions: No Had a fall or experienced change in activities of daily living that may affect risk of falls: No Signs or symptoms of abuse/neglect since last visito No Hospitalized since last visit: No Implantable device outside of the clinic excluding cellular tissue based products placed in the center since last visit: No Has  Dressing in Place as Prescribed: No Electronic Signature(s) Signed: 06/08/2021 8:38:18 PM By: Shawn Stall Previous Signature: 06/06/2021 9:05:19 PM Version By: Shawn Stall Entered By: Shawn Stall on 06/08/2021 20:34:17 -------------------------------------------------------------------------------- Clinic Level of Care Assessment Details Patient Name: Date of Service: ABAS, LEICHT 05/16/2021 9:00 A M Medical Record Number: [id number] Patient Account Number: [id number] Date of Birth/Sex: Treating RN: [birth year] (65 y.o. [name] Primary Care Naika Noto: Tally Joe Other Clinician: Referring Daurice Ovando: Treating Garron Eline/Extender: Andrey Campanile in Treatment: 0 Clinic Level of Care Assessment Items TOOL 1 Quantity Score X- 1 0 Use when EandM and Procedure is performed on INITIAL visit ASSESSMENTS - Nursing Assessment / Reassessment X- 1 20 General Physical Exam (combine w/ comprehensive assessment (listed just below) when performed on new pt. evals) X- 1 25 Comprehensive Assessment (HX, ROS, Risk Assessments, Wounds Hx, etc.) ASSESSMENTS - Wound and Skin Assessment / Reassessment []  - 0 Dermatologic / Skin Assessment (not related to wound area) ASSESSMENTS - Ostomy and/or Continence Assessment and Care []  - 0 Incontinence Assessment and Management []  - 0 Ostomy Care Assessment and Management (repouching, etc.) PROCESS - Coordination of Care []  - 0 Simple Patient / Family Education for ongoing care X- 1 20 Complex (extensive) Patient / Family Education for ongoing care X- 1 10 Staff obtains , Records, T Results / Process Orders est X- 1 10 Staff telephones HHA, Nursing Homes / Clarify orders / etc []  - 0 Routine Transfer to another Facility (non-emergent condition) []  - 0 Routine Hospital Admission (non-emergent condition) []  - 0 New Admissions / / Ordering NPWT Apligraf, etc. , []  - 0 Emergency  Hospital Admission (emergent condition) PROCESS - Special Needs []  - 0 Pediatric / Minor Patient Management []  - 0 Isolation Patient Management []  - 0 Hearing / Language / Visual special needs []  - 0 Assessment of Community assistance (transportation, D/C planning, etc.) []  - 0 Additional assistance / Altered mentation []  - 0 Support  Surface(s) Assessment (bed, cushion, seat, etc.) INTERVENTIONS - Miscellaneous []  - 0 External ear exam []  - 0 Patient Transfer (multiple staff / / Similar devices) []  - 0 Simple Staple / Suture removal (25 or less) []  - 0 Complex Staple / Suture removal (26 or more) []  - 0 Hypo/Hyperglycemic Management (do not check if billed separately) X- 1 15 Ankle / Brachial Index (ABI) - do not check if billed separately Has the patient been seen at the hospital within the last three years: Yes Total Score: 100 Level Of Care: New/Established - Level 3 Electronic Signature(s) Signed: 05/16/2021 5:08:20 PM By: Nurse, adult Entered By: on 05/16/2021 10:20:01 -------------------------------------------------------------------------------- Encounter Discharge Information Details Patient Name: Date of Service: 05/16/2021 9:00 A M Medical Record Number: Antonieta Iba Patient Account Number: Antonieta Iba Date of Birth/Sex: Treating RN: September 15, 1956 (65 y.o. 05/18/2021 Primary Care Nieve Rojero: [id number] Other Clinician: Referring Shanzay Hepworth: Treating Ayn Domangue/Extender: [id number] in Treatment: 0 Encounter Discharge Information Items Post Procedure Vitals Discharge Condition: Stable Temperature (F): 97.7 Ambulatory Status: Ambulatory Pulse (bpm): 76 Discharge Destination: Home Respiratory Rate (breaths/min): 16 Transportation: Private Auto Blood Pressure (mmHg): 160/99 Accompanied By: alone Schedule Follow-up Appointment: No Clinical Summary of Care: Patient Declined Electronic  Signature(s) Signed: 06/08/2021 8:38:18 PM By: 68 Previous Signature: 06/06/2021 9:05:19 PM Version By: Tally Joe Entered By: Andrey Campanile on 06/08/2021 20:37:02 -------------------------------------------------------------------------------- Lower Extremity Assessment Details Patient Name: Date of Service: Shawn Stall 05/16/2021 9:00 A M Medical Record Number: Shawn Stall Patient Account Number: Shawn Stall Date of Birth/Sex: Treating RN: 1956-02-21 (65 y.o. 05/18/2021, [id number] Primary Care Tasia Liz: [phone] Other Clinician: Referring Lesslie Mossa: Treating Kimmarie Pascale/Extender: 07/04/1956 in Treatment: 0 Edema Assessment Assessed: 68: No] [name]: No] E[Left: dema] [Right: :] Calf Left: Right: Point of Measurement: 34 cm From Medial Instep 47 cm 44.5 cm Ankle Left: Right: Point of Measurement: 10 cm From Medial Instep 23.5 cm 23.4 cm Knee To Floor Left: Right: From Medial Instep 48 cm 48 cm Vascular Assessment Pulses: Dorsalis Pedis Palpable: [Left:Yes] [Right:Yes] Blood Pressure: Brachial: [Left:156] [Right:156] Ankle: [Left:Dorsalis Pedis: 201 1.29] [Right:Dorsalis Pedis: 201] Electronic Signature(s) Signed: 06/08/2021 8:38:18 PM By: Tally Joe Previous Signature: 06/06/2021 9:05:19 PM Version By: Kyra Searles Entered By: Franne Forts on 06/08/2021 20:35:44 -------------------------------------------------------------------------------- Multi Wound Chart Details Patient Name: Date of Service: Shawn Stall 05/16/2021 9:00 A M Medical Record Number: Shawn Stall Patient Account Number: Shawn Stall Date of Birth/Sex: Treating RN: [birth year] (65 y.o. 05/18/2021 Primary Care Demarr Kluever: [id number] Other Clinician: Referring Saleena Tamas: Treating Dia Jefferys/Extender: [id number] in Treatment: 0 Vital Signs Height(in): 72 Pulse(bpm): 78 Weight(lbs): 271 Blood Pressure(mmHg): 160/99 Body  Mass Index(BMI): 37 Temperature(F): 97.7 Respiratory Rate(breaths/min): 16 Photos: [4:No Photos] Left T Great oe Right T Great oe Right T Second oe Wound Location: Gradually Appeared Gradually Appeared Gradually Appeared Wounding Event: Diabetic Wound/Ulcer of the Lower Diabetic Wound/Ulcer of the Lower Diabetic Wound/Ulcer of the Lower Primary Etiology: Extremity Extremity Extremity Sleep Apnea, Coronary Artery Sleep Apnea, Coronary Artery Sleep Apnea, Coronary Artery Comorbid History: Disease, Hypertension, Type II Disease, Hypertension, Type II Disease, Hypertension, Type II Diabetes, Osteoarthritis, Neuropathy, Diabetes, Osteoarthritis, Neuropathy, Diabetes, Osteoarthritis, Neuropathy, Confinement Anxiety Confinement Anxiety Confinement Anxiety 03/20/2021 07/21/2020 05/02/2021 Date Acquired: 0 0 0 Weeks of Treatment: Open Open Open Wound Status: 1.5x1.3x0.2 1.5x0.9x0.4 0.5x0.5x0.2 Measurements L x W x D (cm) 1.532 1.06 0.196 A (cm) : rea 0.306 0.424 0.039 Volume (cm) :  0.00% 0.00% N/A % Reduction in A rea: 0.00% 0.00% N/A % Reduction in Volume: 12 Starting Position 1 (o'clock): 12 Ending Position 1 (o'clock): 0.2 Maximum Distance 1 (cm): Yes No No Undermining: Grade 1 Grade 1 Grade 1 Classification: Medium Medium Medium Exudate A mount: Serosanguineous Serosanguineous Serosanguineous Exudate Type: red, brown red, brown red, brown Exudate Color: Thickened Thickened Distinct, outline attached Wound Margin: Large (67-100%) Large (67-100%) Large (67-100%) Granulation A mount: Red, Pink Red Pink Granulation Quality: None Present (0%) Small (1-33%) None Present (0%) Necrotic A mount: Fat Layer (Subcutaneous Tissue): Yes Fat Layer (Subcutaneous Tissue): Yes Fat Layer (Subcutaneous Tissue): Yes Exposed Structures: Fascia: No Fascia: No Fascia: No Tendon: No Tendon: No Tendon: No Muscle: No Muscle: No Muscle: No Joint: No Joint: No Joint: No Bone:  No Bone: No Bone: No None None None Epithelialization: Debridement - Excisional Debridement - Excisional Debridement - Excisional Debridement: 09:57 09:57 09:57 Pre-procedure Verification/Time Out Taken: Callus, Subcutaneous Callus, Subcutaneous Callus, Subcutaneous Tissue Debrided: Skin/Subcutaneous Tissue Skin/Subcutaneous Tissue Skin/Subcutaneous Tissue Level: 1.95 1.35 0.25 Debridement A (sq cm): rea Blade, Curette, Forceps, Scissors Blade, Curette, Forceps, Scissors Blade, Curette, Forceps, Scissors Instrument: Minimum Minimum Minimum Bleeding: Pressure Pressure Pressure Hemostasis A chieved: Procedure was tolerated well Procedure was tolerated well Procedure was tolerated well Debridement Treatment Response: 1.5x1.3x0.2 1.5x0.9x0.4 0.5x0.5x0.2 Post Debridement Measurements L x W x D (cm) 0.306 0.424 0.039 Post Debridement Volume: (cm) Debridement Debridement Debridement Procedures Performed: Treatment Notes Wound #2 (Toe Great) Wound Laterality: Left Cleanser Soap and Water Discharge Instruction: May shower and wash wound with dial antibacterial soap and water prior to dressing change. Peri-Wound Care Topical Primary Dressing KerraCel Ag Gelling Fiber Dressing, 2x2 in (silver alginate) Discharge Instruction: Apply silver alginate to wound bed as instructed Secondary Dressing Woven Gauze Sponges 2x2 in Discharge Instruction: Apply over primary dressing as directed. Optifoam Non-Adhesive Dressing, 4x4 in Discharge Instruction: Apply as donut over dressing Secured With Conforming Stretch Gauze Bandage, Sterile 2x75 (in/in) Discharge Instruction: Secure with stretch gauze as directed. Transpore Surgical Tape, 2x10 (in/yd) Discharge Instruction: Secure dressing with tape as directed. Compression Wrap Compression Stockings Add-Ons Wound #3 (Toe Great) Wound Laterality: Right Cleanser Soap and Water Discharge Instruction: May shower and wash wound with dial  antibacterial soap and water prior to dressing change. Peri-Wound Care Topical Primary Dressing KerraCel Ag Gelling Fiber Dressing, 2x2 in (silver alginate) Discharge Instruction: Apply silver alginate to wound bed as instructed Secondary Dressing Woven Gauze Sponges 2x2 in Discharge Instruction: Apply over primary dressing as directed. Optifoam Non-Adhesive Dressing, 4x4 in Discharge Instruction: Apply as donut over dressing Secured With Conforming Stretch Gauze Bandage, Sterile 2x75 (in/in) Discharge Instruction: Secure with stretch gauze as directed. Transpore Surgical Tape, 2x10 (in/yd) Discharge Instruction: Secure dressing with tape as directed. Compression Wrap Compression Stockings Add-Ons Wound #4 (Toe Second) Wound Laterality: Right Cleanser Soap and Water Discharge Instruction: May shower and wash wound with dial antibacterial soap and water prior to dressing change. Peri-Wound Care Topical Primary Dressing KerraCel Ag Gelling Fiber Dressing, 2x2 in (silver alginate) Discharge Instruction: Apply silver alginate to wound bed as instructed Secondary Dressing Woven Gauze Sponges 2x2 in Discharge Instruction: Apply over primary dressing as directed. Optifoam Non-Adhesive Dressing, 4x4 in Discharge Instruction: Apply as donut over dressing Secured With Conforming Stretch Gauze Bandage, Sterile 2x75 (in/in) Discharge Instruction: Secure with stretch gauze as directed. Transpore Surgical Tape, 2x10 (in/yd) Discharge Instruction: Secure dressing with tape as directed. Compression Wrap Compression Stockings Add-Ons Electronic Signature(s) Signed: 05/17/2021 9:44:39 AM By: Mikey Bussing,  Shanda Bumps DO Signed: 05/17/2021 5:24:07 PM By: Antonieta Iba Entered By: Geralyn Corwin on 05/17/2021 09:27:18 -------------------------------------------------------------------------------- Multi-Disciplinary Care Plan Details Patient Name: Date of Service: ODILON, CASS 05/16/2021  9:00 A M Medical Record Number: [id number] Patient Account Number: [id number] Date of Birth/Sex: Treating RN: 08/04/1956 (65 y.o. [name] Primary Care Easter Schinke: Tally Joe Other Clinician: Referring Asuncion Tapscott: Treating Azarius Lambson/Extender: Andrey Campanile in Treatment: 0 Active Inactive Nutrition Nursing Diagnoses: Impaired glucose control: actual or potential Goals: Patient/caregiver verbalizes understanding of need to maintain therapeutic glucose control per primary care physician Date Initiated: 05/16/2021 Target Resolution Date: 06/13/2021 Goal Status: Active Interventions: Provide education on elevated blood sugars and impact on wound healing Provide education on nutrition Notes: Orientation to the Wound Care Program Nursing Diagnoses: Knowledge deficit related to the wound healing center program Goals: Patient/caregiver will verbalize understanding of the Wound Healing Center Program Date Initiated: 05/16/2021 Target Resolution Date: 06/13/2021 Goal Status: Active Interventions: Provide education on orientation to the wound center Notes: Wound/Skin Impairment Nursing Diagnoses: Impaired tissue integrity Goals: Patient/caregiver will verbalize understanding of skin care regimen Date Initiated: 05/16/2021 Target Resolution Date: 06/13/2021 Goal Status: Active Ulcer/skin breakdown will have a volume reduction of 30% by week 4 Date Initiated: 05/16/2021 Target Resolution Date: 06/13/2021 Goal Status: Active Interventions: Assess patient/caregiver ability to obtain necessary supplies Assess patient/caregiver ability to perform ulcer/skin care regimen upon admission and as needed Assess ulceration(s) every visit Provide education on ulcer and skin care Treatment Activities: Skin care regimen initiated : 05/16/2021 Topical wound management initiated : 05/16/2021 Notes: Electronic Signature(s) Signed: 05/16/2021 9:41:40 AM By: Antonieta Iba Entered By: Antonieta Iba on 05/16/2021 09:41:39 -------------------------------------------------------------------------------- Pain Assessment Details Patient Name: Date of Service: [name] 05/16/2021 9:00 A M Medical Record Number: [id number] Patient Account Number: [id number] Date of Birth/Sex: Treating RN: 04/08/1956 (65 y.o. [name] Primary Care Reola Buckles: Tally Joe Other Clinician: Referring Kismet Facemire: Treating Franklyn Cafaro/Extender: Andrey Campanile in Treatment: 0 Active Problems Location of Pain Severity and Description of Pain Patient Has Paino Yes Site Locations Duration of the Pain. Duration of the Pain. Constant / Intermittento Intermittent How Long Does it Lasto Hours: 1 Minutes: Rate the pain. Current Pain Level: 2 Worst Pain Level: 5 Least Pain Level: 0 Tolerable Pain Level: 5 Character of Pain Describe the Pain: Aching, Throbbing Pain Management and Medication Current Pain Management: Medication: No Cold Application: No Rest: No Massage: No Activity: No T.E.N.S.: No Heat Application: No Leg drop or elevation: No Is the Current Pain Management Adequate: Inadequate Notes pain at night ( remove cushioning at night) Electronic Signature(s) Signed: 06/08/2021 8:38:18 PM By: Shawn Stall Previous Signature: 06/06/2021 9:05:19 PM Version By: Shawn Stall Entered By: Shawn Stall on 06/08/2021 20:38:01 -------------------------------------------------------------------------------- Patient/Caregiver Education Details Patient Name: Date of Service: Cheyney, WILLIA M C. 6/27/2022andnbsp9:00 A M Medical Record Number: [id number] Patient Account Number: [id number] Date of Birth/Gender: Treating RN: 1956-08-30 (65 y.o. [name] Primary Care Physician: Tally Joe Other Clinician: Referring Physician: Treating Physician/Extender: Andrey Campanile in Treatment: 0 Education  Assessment Education Provided To: Patient Education Topics Provided Elevated Blood Sugar/ Impact on Healing: Methods: Explain/Verbal, Printed Responses: State content correctly Offloading: Methods: Explain/Verbal, Printed Responses: State content correctly Welcome T [facility]: o Methods: Explain/Verbal, Printed Responses: State content correctly Wound/Skin Impairment: Methods: Explain/Verbal, Printed Responses: State content correctly Electronic Signature(s) Signed: 05/16/2021 5:08:20 PM By: Antonieta Iba Entered By: Antonieta Iba on 05/16/2021 10:19:12 -------------------------------------------------------------------------------- Wound Assessment  Details Patient Name: Date of Service: Loura HaltRUSH, WILLIA M C. 05/16/2021 9:00 A M Medical Record Number: [id number] Patient Account Number: [id number] Date of Birth/Sex: Treating RN: 04-12-1956 (65 y.o. Harlon FlorM) Deaton, Millard.LoaBobbi Primary Care Kyreese Chio: Tally JoeSwayne, David Other Clinician: Referring Dontea Corlew: Treating Briyah Wheelwright/Extender: Andrey CampanileHoffman, Jessica Swayne, David Weeks in Treatment: 0 Wound Status Wound Number: 2 Primary Diabetic Wound/Ulcer of the Lower Extremity Etiology: Wound Location: Left T Great oe Wound Open Wounding Event: Gradually Appeared Status: Date Acquired: 03/20/2021 Comorbid Sleep Apnea, Coronary Artery Disease, Hypertension, Type II Weeks Of Treatment: 0 History: Diabetes, Osteoarthritis, Neuropathy, Confinement Anxiety Clustered Wound: No Photos Wound Measurements Length: (cm) 1.5 Width: (cm) 1.3 Depth: (cm) 0.2 Area: (cm) 1.532 Volume: (cm) 0.306 % Reduction in Area: 0% % Reduction in Volume: 0% Epithelialization: None Tunneling: No Undermining: Yes Starting Position (o'clock): 12 Ending Position (o'clock): 12 Maximum Distance: (cm) 0.2 Wound Description Classification: Grade 1 Wound Margin: Thickened Exudate Amount: Medium Exudate Type: Serosanguineous Exudate Color: red, brown Foul Odor  After Cleansing: No Slough/Fibrino No Wound Bed Granulation Amount: Large (67-100%) Exposed Structure Granulation Quality: Red, Pink Fascia Exposed: No Necrotic Amount: None Present (0%) Fat Layer (Subcutaneous Tissue) Exposed: Yes Tendon Exposed: No Muscle Exposed: No Joint Exposed: No Bone Exposed: No Treatment Notes Wound #2 (Toe Great) Wound Laterality: Left Cleanser Soap and Water Discharge Instruction: May shower and wash wound with dial antibacterial soap and water prior to dressing change. Peri-Wound Care Topical Primary Dressing KerraCel Ag Gelling Fiber Dressing, 2x2 in (silver alginate) Discharge Instruction: Apply silver alginate to wound bed as instructed Secondary Dressing Woven Gauze Sponges 2x2 in Discharge Instruction: Apply over primary dressing as directed. Optifoam Non-Adhesive Dressing, 4x4 in Discharge Instruction: Apply as donut over dressing Secured With Conforming Stretch Gauze Bandage, Sterile 2x75 (in/in) Discharge Instruction: Secure with stretch gauze as directed. Transpore Surgical Tape, 2x10 (in/yd) Discharge Instruction: Secure dressing with tape as directed. Compression Wrap Compression Stockings Add-Ons Electronic Signature(s) Signed: 06/08/2021 8:38:18 PM By: Shawn Stalleaton, Bobbi Previous Signature: 05/16/2021 4:13:19 PM Version By: Karl Itoawkins, Destiny Previous Signature: 06/06/2021 9:05:19 PM Version By: Shawn Stalleaton, Bobbi Entered By: Shawn Stalleaton, Bobbi on 06/08/2021 20:35:56 -------------------------------------------------------------------------------- Wound Assessment Details Patient Name: Date of Service: Loura HaltUSH, WILLIA M C. 05/16/2021 9:00 A M Medical Record Number: [id number] Patient Account Number: [id number] Date of Birth/Sex: Treating RN: 04-12-1956 (65 y.o. Harlon FlorM) Deaton, Millard.LoaBobbi Primary Care Loreda Silverio: Tally JoeSwayne, David Other Clinician: Referring Shantasia Hunnell: Treating Valor Quaintance/Extender: Andrey CampanileHoffman, Jessica Swayne, David Weeks in Treatment: 0 Wound  Status Wound Number: 3 Primary Diabetic Wound/Ulcer of the Lower Extremity Etiology: Wound Location: Right T Great oe Wound Open Wounding Event: Gradually Appeared Status: Date Acquired: 07/21/2020 Comorbid Sleep Apnea, Coronary Artery Disease, Hypertension, Type II Weeks Of Treatment: 0 History: Diabetes, Osteoarthritis, Neuropathy, Confinement Anxiety Clustered Wound: No Photos Wound Measurements Length: (cm) 1.5 Width: (cm) 0.9 Depth: (cm) 0.4 Area: (cm) 1.06 Volume: (cm) 0.424 % Reduction in Area: 0% % Reduction in Volume: 0% Epithelialization: None Tunneling: No Undermining: No Wound Description Classification: Grade 1 Wound Margin: Thickened Exudate Amount: Medium Exudate Type: Serosanguineous Exudate Color: red, brown Foul Odor After Cleansing: No Slough/Fibrino No Wound Bed Granulation Amount: Large (67-100%) Exposed Structure Granulation Quality: Red Fascia Exposed: No Necrotic Amount: Small (1-33%) Fat Layer (Subcutaneous Tissue) Exposed: Yes Tendon Exposed: No Muscle Exposed: No Joint Exposed: No Bone Exposed: No Treatment Notes Wound #3 (Toe Great) Wound Laterality: Right Cleanser Soap and Water Discharge Instruction: May shower and wash wound with dial antibacterial soap and water prior to dressing change. Peri-Wound Care Topical Primary Dressing  KerraCel Ag Gelling Fiber Dressing, 2x2 in (silver alginate) Discharge Instruction: Apply silver alginate to wound bed as instructed Secondary Dressing Woven Gauze Sponges 2x2 in Discharge Instruction: Apply over primary dressing as directed. Optifoam Non-Adhesive Dressing, 4x4 in Discharge Instruction: Apply as donut over dressing Secured With Conforming Stretch Gauze Bandage, Sterile 2x75 (in/in) Discharge Instruction: Secure with stretch gauze as directed. Transpore Surgical Tape, 2x10 (in/yd) Discharge Instruction: Secure dressing with tape as directed. Compression Wrap Compression  Stockings Add-Ons Electronic Signature(s) Signed: 06/08/2021 8:38:18 PM By: Shawn Stall Previous Signature: 05/16/2021 4:13:19 PM Version By: Karl Ito Previous Signature: 06/06/2021 9:05:19 PM Version By: Shawn Stall Entered By: Shawn Stall on 06/08/2021 20:36:12 -------------------------------------------------------------------------------- Wound Assessment Details Patient Name: Date of Service: [name] 05/16/2021 9:00 A M Medical Record Number: [id number] Patient Account Number: [id number] Date of Birth/Sex: Treating RN: [birth year] (65 y.o. [name] Primary Care Kwasi Joung: Tally Joe Other Clinician: Referring Corianne Buccellato: Treating Pleasant Bensinger/Extender: Andrey Campanile in Treatment: 0 Wound Status Wound Number: 4 Primary Diabetic Wound/Ulcer of the Lower Extremity Etiology: Wound Location: Right T Second oe Wound Open Wounding Event: Gradually Appeared Status: Date Acquired: 05/02/2021 Comorbid Sleep Apnea, Coronary Artery Disease, Hypertension, Type II Weeks Of Treatment: 0 History: Diabetes, Osteoarthritis, Neuropathy, Confinement Anxiety Clustered Wound: No Wound Measurements Length: (cm) 0.5 Width: (cm) 0.5 Depth: (cm) 0.2 Area: (cm) 0.196 Volume: (cm) 0.039 % Reduction in Area: % Reduction in Volume: Epithelialization: None Tunneling: No Undermining: No Wound Description Classification: Grade 1 Wound Margin: Distinct, outline attached Exudate Amount: Medium Exudate Type: Serosanguineous Exudate Color: red, brown Foul Odor After Cleansing: No Slough/Fibrino No Wound Bed Granulation Amount: Large (67-100%) Exposed Structure Granulation Quality: Pink Fascia Exposed: No Necrotic Amount: None Present (0%) Fat Layer (Subcutaneous Tissue) Exposed: Yes Tendon Exposed: No Muscle Exposed: No Joint Exposed: No Bone Exposed: No Electronic Signature(s) Signed: 05/16/2021 5:08:20 PM By: Antonieta Iba Entered By:  Antonieta Iba on 05/16/2021 10:09:41 -------------------------------------------------------------------------------- Vitals Details Patient Name: Date of Service: [name] 05/16/2021 9:00 A M Medical Record Number: [id number] Patient Account Number: [id number] Date of Birth/Sex: Treating RN: [birth year] (65 y.o. [name], [name] Primary Care Neema Fluegge: Tally Joe Other Clinician: Referring Shamera Yarberry: Treating Alcus Bradly/Extender: Andrey Campanile in Treatment: 0 Vital Signs Time Taken: 09:00 Temperature (F): 97.7 Height (in): 72 Pulse (bpm): 78 Source: Stated Respiratory Rate (breaths/min): 16 Weight (lbs): 271 Blood Pressure (mmHg): 160/99 Source: Stated Reference Range: 80 - 120 mg / dl Body Mass Index (BMI): 36.8 Airway Pulse Oximetry (%): 99 Electronic Signature(s) Signed: 06/08/2021 8:38:18 PM By: Shawn Stall Previous Signature: 06/06/2021 9:05:19 PM Version By: Shawn Stall Entered By: Shawn Stall on 06/08/2021 69:48:54

## 2021-06-10 NOTE — Progress Notes (Signed)
[name] [name] ([id number]) Visit Report for 05/16/2021 Chief Complaint Document Details Patient Name: Date of Service: [name] [name], [name] [name] 05/16/2021 9:00 A M Medical Record Number: [id number] Patient Account Number: [id number] Date of Birth/Sex: Treating RN: 1956-05-21 (65 y.o. [name] Primary Care Provider: Tally Joe Other Clinician: Referring Provider: Treating Provider/Extender: Andrey Campanile in Treatment: 0 Information Obtained from: Patient Chief Complaint Bilateral toe wounds Electronic Signature(s) Signed: 05/17/2021 9:44:39 AM By: Geralyn Corwin DO Entered By: Geralyn Corwin on 05/17/2021 09:27:45 -------------------------------------------------------------------------------- Debridement Details Patient Name: Date of Service: [name] Halt 05/16/2021 9:00 A M Medical Record Number: [id number] Patient Account Number: [id number] Date of Birth/Sex: Treating RN: [birth year] (65 y.o. [name] Primary Care Provider: Tally Joe Other Clinician: Referring Provider: Treating Provider/Extender: Andrey Campanile in Treatment: 0 Debridement Performed for Assessment: Wound #2 Left T Great oe Performed By: Physician Geralyn Corwin, DO Debridement Type: Debridement Severity of Tissue Pre Debridement: Fat layer exposed Level of Consciousness (Pre-procedure): Awake and Alert Pre-procedure Verification/Time Out Yes - 09:57 Taken: Start Time: 09:58 T Area Debrided (L x W): otal 1.5 (cm) x 1.3 (cm) = 1.95 (cm) Tissue and other material debrided: Non-Viable, Callus, Subcutaneous, Skin: Dermis Level: Skin/Subcutaneous Tissue Debridement Description: Excisional Instrument: Blade, Curette, Forceps, Scissors Bleeding: Minimum Hemostasis Achieved: Pressure End Time: 10:03 Response to Treatment: Procedure was tolerated well Level of Consciousness (Post- Awake and Alert procedure): Post Debridement Measurements  of Total Wound Length: (cm) 1.5 Width: (cm) 1.3 Depth: (cm) 0.2 Volume: (cm) 0.306 Character of Wound/Ulcer Post Debridement: Stable Severity of Tissue Post Debridement: Fat layer exposed Post Procedure Diagnosis Same as Pre-procedure Electronic Signature(s) Signed: 05/16/2021 5:08:20 PM By: Antonieta Iba Signed: 05/17/2021 9:44:39 AM By: Geralyn Corwin DO Entered By: Antonieta Iba on 05/16/2021 10:06:15 -------------------------------------------------------------------------------- Debridement Details Patient Name: Date of Service: [name] Halt. 05/16/2021 9:00 A M Medical Record Number: [id number] Patient Account Number: [id number] Date of Birth/Sex: Treating RN: [birth year] (65 y.o. [name] Primary Care Provider: Tally Joe Other Clinician: Referring Provider: Treating Provider/Extender: Andrey Campanile in Treatment: 0 Debridement Performed for Assessment: Wound #3 Right T Great oe Performed By: Physician Geralyn Corwin, DO Debridement Type: Debridement Severity of Tissue Pre Debridement: Fat layer exposed Level of Consciousness (Pre-procedure): Awake and Alert Pre-procedure Verification/Time Out Yes - 09:57 Taken: Start Time: 10:03 T Area Debrided (L x W): otal 1.5 (cm) x 0.9 (cm) = 1.35 (cm) Tissue and other material debrided: Non-Viable, Callus, Subcutaneous, Skin: Dermis Level: Skin/Subcutaneous Tissue Debridement Description: Excisional Instrument: Blade, Curette, Forceps, Scissors Bleeding: Minimum Hemostasis Achieved: Pressure End Time: 10:07 Response to Treatment: Procedure was tolerated well Level of Consciousness (Post- Awake and Alert procedure): Post Debridement Measurements of Total Wound Length: (cm) 1.5 Width: (cm) 0.9 Depth: (cm) 0.4 Volume: (cm) 0.424 Character of Wound/Ulcer Post Debridement: Stable Severity of Tissue Post Debridement: Fat layer exposed Post Procedure Diagnosis Same as  Pre-procedure Electronic Signature(s) Signed: 05/16/2021 5:08:20 PM By: Antonieta Iba Signed: 05/17/2021 9:44:39 AM By: Geralyn Corwin DO Entered By: Antonieta Iba on 05/16/2021 10:11:39 -------------------------------------------------------------------------------- Debridement Details Patient Name: Date of Service: [name] Halt. 05/16/2021 9:00 A M Medical Record Number: [id number] Patient Account Number: [id number] Date of Birth/Sex: Treating RN: 01-16-1956 (64 y.o. [name] Primary Care Provider: Tally Joe Other Clinician: Referring Provider: Treating Provider/Extender: Andrey Campanile in Treatment: 0 Debridement Performed for Assessment: Wound #4 Right T Second oe Performed By: Physician Geralyn Corwin, DO Debridement  Type: Debridement Severity of Tissue Pre Debridement: Fat layer exposed Level of Consciousness (Pre-procedure): Awake and Alert Pre-procedure Verification/Time Out Yes - 09:57 Taken: Start Time: 10:07 T Area Debrided (L x W): otal 0.5 (cm) x 0.5 (cm) = 0.25 (cm) Tissue and other material debrided: Non-Viable, Callus, Subcutaneous, Skin: Dermis Level: Skin/Subcutaneous Tissue Debridement Description: Excisional Instrument: Blade, Curette, Forceps, Scissors Bleeding: Minimum Hemostasis Achieved: Pressure End Time: 10:13 Response to Treatment: Procedure was tolerated well Level of Consciousness (Post- Awake and Alert procedure): Post Debridement Measurements of Total Wound Length: (cm) 0.5 Width: (cm) 0.5 Depth: (cm) 0.2 Volume: (cm) 0.039 Character of Wound/Ulcer Post Debridement: Stable Severity of Tissue Post Debridement: Fat layer exposed Post Procedure Diagnosis Same as Pre-procedure Electronic Signature(s) Signed: 05/16/2021 5:08:20 PM By: Antonieta Iba Signed: 05/17/2021 9:44:39 AM By: Geralyn Corwin DO Entered By: Antonieta Iba on 05/16/2021  10:14:10 -------------------------------------------------------------------------------- HPI Details Patient Name: Date of Service: [name] Halt. 05/16/2021 9:00 A M Medical Record Number: [id number] Patient Account Number: [id number] Date of Birth/Sex: Treating RN: [birth year] (65 y.o. [name] Primary Care Provider: Tally Joe Other Clinician: Referring Provider: Treating Provider/Extender: Andrey Campanile in Treatment: 0 History of Present Illness HPI Description: Admission 6/27 Mr. Majd Tissue is a 65 year old male with a past medical history of insulin-dependent type 2 diabetes, CABG in 2011 and essential hypertension that presents to the clinic for bilateral toe wounds. He has wounds to his left great toe that started in May 2022. He also has wounds to his right great toe and second toe that started in September 2021. He noticed they started as blisters and eventually worsened. He is currently using antibiotic ointment and keeping them covered. He reports minimal pain but has decreased sensation to his feet bilaterally. He denies signs of infection. Electronic Signature(s) Signed: 05/17/2021 9:44:39 AM By: Geralyn Corwin DO Entered By: Geralyn Corwin on 05/17/2021 09:36:21 -------------------------------------------------------------------------------- Physical Exam Details Patient Name: Date of Service: LLOYD, AYO 05/16/2021 9:00 A M Medical Record Number: [id number] Patient Account Number: [id number] Date of Birth/Sex: Treating RN: 12/14/1955 (65 y.o. [name] Primary Care Provider: Tally Joe Other Clinician: Referring Provider: Treating Provider/Extender: Andrey Campanile in Treatment: 0 Constitutional respirations regular, non-labored and within target range for patient.. Cardiovascular 2+ dorsalis pedis/posterior tibialis pulses. Psychiatric pleasant and cooperative. Notes Right foot:  Great toe and second toe have open wounds with nonviable tissue, callus and granulation tissue present no signs of infection. Left foot: Great toe has an open wound with nonviable tissue callus and granulation tissue present. No signs of infection. Electronic Signature(s) Signed: 05/17/2021 9:44:39 AM By: Geralyn Corwin DO Entered By: Geralyn Corwin on 05/17/2021 09:38:25 -------------------------------------------------------------------------------- Physician Orders Details Patient Name: Date of Service: [name] Halt 05/16/2021 9:00 A M Medical Record Number: [id number] Patient Account Number: [id number] Date of Birth/Sex: Treating RN: 1956-08-31 (65 y.o. [name] Primary Care Provider: Tally Joe Other Clinician: Referring Provider: Treating Provider/Extender: Andrey Campanile in Treatment: 0 Verbal / Phone Orders: No Diagnosis Coding ICD-10 Coding Code Description [phone] 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 L97.519 Non-pressure chronic ulcer of other part of right foot with unspecified severity E11.621 Type 2 diabetes mellitus with foot ulcer E11.40 Type 2 diabetes mellitus with diabetic neuropathy, unspecified Follow-up Appointments ppointment in 1 week. - with Dr. Mikey Bussing Return A Other: - [employer]) for Supplies Bathing/ Shower/ Hygiene May shower and wash wound with soap and  water. Edema Control - Lymphedema / SCD / Other Avoid standing for long periods of time. Off-Loading Other: - -Offloading Shoe to Right Foot -Reduce as much pressure as possible to both feet, decrease work hours if possible. -Consider using T Contact Cast otal Additional Orders / Instructions Follow Nutritious Diet - -Increase protein, 100-120g daily -Keep blood sugars monitored Wound Treatment Wound #2 - T Great oe Wound Laterality: Left Cleanser: Soap and Water 1 x Per Day/15 Days Discharge Instructions: May  shower and wash wound with dial antibacterial soap and water prior to dressing change. Prim Dressing: KerraCel Ag Gelling Fiber Dressing, 2x2 in (silver alginate) 1 x Per Day/15 Days ary Discharge Instructions: Apply silver alginate to wound bed as instructed Secondary Dressing: Woven Gauze Sponges 2x2 in 1 x Per Day/15 Days Discharge Instructions: Apply over primary dressing as directed. Secondary Dressing: Optifoam Non-Adhesive Dressing, 4x4 in 1 x Per Day/15 Days Discharge Instructions: Apply as donut over dressing Secured With: Conforming Stretch Gauze Bandage, Sterile 2x75 (in/in) 1 x Per Day/15 Days Discharge Instructions: Secure with stretch gauze as directed. Secured With: Transpore Surgical Tape, 2x10 (in/yd) 1 x Per Day/15 Days Discharge Instructions: Secure dressing with tape as directed. Wound #3 - T Great oe Wound Laterality: Right Cleanser: Soap and Water 1 x Per Day/15 Days Discharge Instructions: May shower and wash wound with dial antibacterial soap and water prior to dressing change. Prim Dressing: KerraCel Ag Gelling Fiber Dressing, 2x2 in (silver alginate) 1 x Per Day/15 Days ary Discharge Instructions: Apply silver alginate to wound bed as instructed Secondary Dressing: Woven Gauze Sponges 2x2 in 1 x Per Day/15 Days Discharge Instructions: Apply over primary dressing as directed. Secondary Dressing: Optifoam Non-Adhesive Dressing, 4x4 in 1 x Per Day/15 Days Discharge Instructions: Apply as donut over dressing Secured With: Conforming Stretch Gauze Bandage, Sterile 2x75 (in/in) 1 x Per Day/15 Days Discharge Instructions: Secure with stretch gauze as directed. Secured With: Transpore Surgical Tape, 2x10 (in/yd) 1 x Per Day/15 Days Discharge Instructions: Secure dressing with tape as directed. Wound #4 - T Second oe Wound Laterality: Right Cleanser: Soap and Water 1 x Per Day/15 Days Discharge Instructions: May shower and wash wound with dial antibacterial soap and  water prior to dressing change. Prim Dressing: KerraCel Ag Gelling Fiber Dressing, 2x2 in (silver alginate) 1 x Per Day/15 Days ary Discharge Instructions: Apply silver alginate to wound bed as instructed Secondary Dressing: Woven Gauze Sponges 2x2 in 1 x Per Day/15 Days Discharge Instructions: Apply over primary dressing as directed. Secondary Dressing: Optifoam Non-Adhesive Dressing, 4x4 in 1 x Per Day/15 Days Discharge Instructions: Apply as donut over dressing Secured With: Conforming Stretch Gauze Bandage, Sterile 2x75 (in/in) 1 x Per Day/15 Days Discharge Instructions: Secure with stretch gauze as directed. Secured With: Transpore Surgical Tape, 2x10 (in/yd) 1 x Per Day/15 Days Discharge Instructions: Secure dressing with tape as directed. Electronic Signature(s) Signed: 05/17/2021 9:44:39 AM By: Geralyn Corwin DO Previous Signature: 05/16/2021 5:08:20 PM Version By: Antonieta Iba Entered By: Geralyn Corwin on 05/17/2021 09:38:52 -------------------------------------------------------------------------------- Problem List Details Patient Name: Date of Service: [name] Halt 05/16/2021 9:00 A M Medical Record Number: [id number] Patient Account Number: [id number] Date of Birth/Sex: Treating RN: Jun 21, 1956 (65 y.o. [name] Primary Care Provider: Tally Joe Other Clinician: Referring Provider: Treating Provider/Extender: Andrey Campanile in Treatment: 0 Active Problems ICD-10 Encounter Code Description Active Date MDM Diagnosis L97.529 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 05/16/2021 No Yes  L97.519 Non-pressure chronic ulcer of other part of right foot with unspecified severity 05/16/2021 No Yes E11.621 Type 2 diabetes mellitus with foot ulcer 05/17/2021 No Yes E11.40 Type 2 diabetes mellitus with diabetic neuropathy, unspecified 05/17/2021 No Yes Inactive Problems Resolved Problems Electronic  Signature(s) Signed: 05/17/2021 9:44:39 AM By: Geralyn Corwin DO Entered By: Geralyn Corwin on 05/17/2021 09:25:34 -------------------------------------------------------------------------------- Progress Note Details Patient Name: Date of Service: [name] Halt 05/16/2021 9:00 A M Medical Record Number: [id number] Patient Account Number: [id number] Date of Birth/Sex: Treating RN: 1956/04/30 (65 y.o. [name] Primary Care Provider: Tally Joe Other Clinician: Referring Provider: Treating Provider/Extender: Andrey Campanile in Treatment: 0 Subjective Chief Complaint Information obtained from Patient Bilateral toe wounds History of Present Illness (HPI) Admission 6/27 Mr. Shawndale Kilpatrick is a 65 year old male with a past medical history of insulin-dependent type 2 diabetes, CABG in 2011 and essential hypertension that presents to the clinic for bilateral toe wounds. He has wounds to his left great toe that started in May 2022. He also has wounds to his right great toe and second toe that started in September 2021. He noticed they started as blisters and eventually worsened. He is currently using antibiotic ointment and keeping them covered. He reports minimal pain but has decreased sensation to his feet bilaterally. He denies signs of infection. Patient History Information obtained from Patient. Allergies No Known Allergies Family History Cancer - Mother, Heart Disease - Father, Hypertension - Father, Stroke - Siblings. Social History Never smoker, Marital Status - Married, Alcohol Use - Rarely - beer, Drug Use - No History, Caffeine Use - Daily. Medical History Eyes Denies history of Cataracts, Glaucoma, Optic Neuritis Hematologic/Lymphatic Denies history of Anemia, Hemophilia, Human Immunodeficiency Virus, Lymphedema, Sickle Cell Disease Respiratory Patient has history of Sleep Apnea - reported ; uses CPAP Denies history of Aspiration,  Asthma, Chronic Obstructive Pulmonary Disease (COPD), Pneumothorax, Tuberculosis Cardiovascular Patient has history of Coronary Artery Disease - reported, Hypertension - reported Denies history of Angina, Arrhythmia, Congestive Heart Failure, Deep Vein Thrombosis, Hypotension, Myocardial Infarction, Peripheral Arterial Disease, Peripheral Venous Disease, Phlebitis, Vasculitis Gastrointestinal Denies history of Cirrhosis , Colitis, Crohnoos, Hepatitis A, Hepatitis B, Hepatitis C Endocrine Patient has history of Type II Diabetes - 2009 Denies history of Type I Diabetes Genitourinary Denies history of End Stage Renal Disease Immunological Denies history of Lupus Erythematosus, Raynaudoos, Scleroderma Integumentary (Skin) Denies history of History of Burn Musculoskeletal Patient has history of Osteoarthritis - knees Denies history of Gout, Rheumatoid Arthritis, Osteomyelitis Neurologic Patient has history of Neuropathy - minor Denies history of Dementia, Quadriplegia, Paraplegia, Seizure Disorder Oncologic Denies history of Received Chemotherapy, Received Radiation Psychiatric Patient has history of 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 Denies history of Anorexia/bulimia Hospitalization/Surgery History - by pass heart. - urology stent. - ORIF of left 2nd toe. Medical A Surgical History Notes nd Cardiovascular blockages 2011 Endocrine wears monitor in skin that checks CBG constantly Review of Systems (ROS) Eyes Complains or has symptoms of Glasses / Contacts - reading. Denies complaints or symptoms of Dry Eyes, Vision Changes. Ear/Nose/Mouth/Throat Complains or has symptoms of Chronic sinus problems or rhinitis - minor changes. Respiratory Denies complaints or symptoms of Chronic or frequent coughs, Shortness of Breath. Cardiovascular Denies complaints or symptoms of Chest pain. Gastrointestinal Denies complaints or symptoms of Frequent diarrhea, Nausea,  Vomiting. Genitourinary Denies complaints or symptoms of Frequent urination. Integumentary (Skin) Denies complaints or symptoms of Wounds. Musculoskeletal Denies complaints or symptoms of Muscle Pain, Muscle Weakness.  Psychiatric Denies complaints or symptoms of Claustrophobia, Suicidal. Objective Constitutional respirations regular, non-labored and within target range for patient.. Vitals Time Taken: 9:00 AM, Height: 72 in, Source: Stated, Weight: 271 lbs, Source: Stated, BMI: 36.8, Temperature: 97.7 F, Pulse: 78 bpm, Respiratory Rate: 16 breaths/min, Blood Pressure: 160/99 mmHg, Pulse Oximetry: 99 %. Cardiovascular 2+ dorsalis pedis/posterior tibialis pulses. Psychiatric pleasant and cooperative. General Notes: Right foot: Great toe and second toe have open wounds with nonviable tissue, callus and granulation tissue present no signs of infection. Left foot: Great toe has an open wound with nonviable tissue callus and granulation tissue present. No signs of infection. Integumentary (Hair, Skin) Wound #2 status is Open. Original cause of wound was Gradually Appeared. The date acquired was: 03/20/2021. The wound is located on the Left T Great. The oe wound measures 1.5cm length x 1.3cm width x 0.2cm depth; 1.532cm^2 area and 0.306cm^3 volume. There is Fat Layer (Subcutaneous Tissue) exposed. There is no tunneling noted, however, there is undermining starting at 12:00 and ending at 12:00 with a maximum distance of 0.2cm. There is a medium amount of serosanguineous drainage noted. The wound margin is thickened. There is large (67-100%) red, pink granulation within the wound bed. There is no necrotic tissue within the wound bed. Wound #3 status is Open. Original cause of wound was Gradually Appeared. The date acquired was: 07/21/2020. The wound is located on the Right T Great. The oe wound measures 1.5cm length x 0.9cm width x 0.4cm depth; 1.06cm^2 area and 0.424cm^3 volume. There is Fat Layer  (Subcutaneous Tissue) exposed. There is no tunneling or undermining noted. There is a medium amount of serosanguineous drainage noted. The wound margin is thickened. There is large (67-100%) red granulation within the wound bed. There is a small (1-33%) amount of necrotic tissue within the wound bed. Wound #4 status is Open. Original cause of wound was Gradually Appeared. The date acquired was: 05/02/2021. The wound is located on the Right T Second. oe The wound measures 0.5cm length x 0.5cm width x 0.2cm depth; 0.196cm^2 area and 0.039cm^3 volume. There is Fat Layer (Subcutaneous Tissue) exposed. There is no tunneling or undermining noted. There is a medium amount of serosanguineous drainage noted. The wound margin is distinct with the outline attached to the wound base. There is large (67-100%) pink granulation within the wound bed. There is no necrotic tissue within the wound bed. Assessment Active Problems ICD-10 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 Non-pressure chronic ulcer of other part of right foot with unspecified severity Type 2 diabetes mellitus with foot ulcer Type 2 diabetes mellitus with diabetic neuropathy, unspecified Patient presents with chronically nonhealing wounds to his toes bilaterally. These were debrided in office. No signs of infection. We had a long discussion about aggressively offloading these areas. We discussed total contact cast vs front offloading shoe. Patient would like to do the total contact cast at the next clinic visit. For now we will use an offloading shoe and silver alginate to the right foot. We will use silver alginate to the left foot with a donut foam offloading pad. I will see him back in 1 week. 45 minutes was spent in the encounter including face-to-face and EMR review Procedures Wound #2 Pre-procedure diagnosis of Wound #2 is a Diabetic Wound/Ulcer of the Lower Extremity located on the Left T Great  .Severity of Tissue Pre Debridement is: oe Fat layer exposed. There was a Excisional Skin/Subcutaneous Tissue Debridement with a total area of  1.95 sq cm performed by Geralyn Corwin, DO. With the following instrument(s): Blade, Curette, Forceps, and Scissors to remove Non-Viable tissue/material. Material removed includes Callus, Subcutaneous Tissue, and Skin: Dermis. No specimens were taken. A time out was conducted at 09:57, prior to the start of the procedure. A Minimum amount of bleeding was controlled with Pressure. The procedure was tolerated well. Post Debridement Measurements: 1.5cm length x 1.3cm width x 0.2cm depth; 0.306cm^3 volume. Character of Wound/Ulcer Post Debridement is stable. Severity of Tissue Post Debridement is: Fat layer exposed. Post procedure Diagnosis Wound #2: Same as Pre-Procedure Wound #3 Pre-procedure diagnosis of Wound #3 is a Diabetic Wound/Ulcer of the Lower Extremity located on the Right T Great .Severity of Tissue Pre Debridement is: oe Fat layer exposed. There was a Excisional Skin/Subcutaneous Tissue Debridement with a total area of 1.35 sq cm performed by Geralyn Corwin, DO. With the following instrument(s): Blade, Curette, Forceps, and Scissors to remove Non-Viable tissue/material. Material removed includes Callus, Subcutaneous Tissue, and Skin: Dermis. No specimens were taken. A time out was conducted at 09:57, prior to the start of the procedure. A Minimum amount of bleeding was controlled with Pressure. The procedure was tolerated well. Post Debridement Measurements: 1.5cm length x 0.9cm width x 0.4cm depth; 0.424cm^3 volume. Character of Wound/Ulcer Post Debridement is stable. Severity of Tissue Post Debridement is: Fat layer exposed. Post procedure Diagnosis Wound #3: Same as Pre-Procedure Wound #4 Pre-procedure diagnosis of Wound #4 is a Diabetic Wound/Ulcer of the Lower Extremity located on the Right T Second .Severity of Tissue Pre  Debridement oe is: Fat layer exposed. There was a Excisional Skin/Subcutaneous Tissue Debridement with a total area of 0.25 sq cm performed by Geralyn Corwin, DO. With the following instrument(s): Blade, Curette, Forceps, and Scissors to remove Non-Viable tissue/material. Material removed includes Callus, Subcutaneous Tissue, and Skin: Dermis. No specimens were taken. A time out was conducted at 09:57, prior to the start of the procedure. A Minimum amount of bleeding was controlled with Pressure. The procedure was tolerated well. Post Debridement Measurements: 0.5cm length x 0.5cm width x 0.2cm depth; 0.039cm^3 volume. Character of Wound/Ulcer Post Debridement is stable. Severity of Tissue Post Debridement is: Fat layer exposed. Post procedure Diagnosis Wound #4: Same as Pre-Procedure Plan Follow-up Appointments: Return Appointment in 1 week. - with Dr. Mikey Bussing Other: - [employer] for Supplies Bathing/ Shower/ Hygiene: May shower and wash wound with soap and water. Edema Control - Lymphedema / SCD / Other: Avoid standing for long periods of time. Off-Loading: Other: - -Offloading Shoe to Right Foot -Reduce as much pressure as possible to both feet, decrease work hours if possible. -Consider using T Contact otal Cast Additional Orders / Instructions: Follow Nutritious Diet - -Increase protein, 100-120g daily -Keep blood sugars monitored WOUND #2: - T Great Wound Laterality: Left oe Cleanser: Soap and Water 1 x Per Day/15 Days Discharge Instructions: May shower and wash wound with dial antibacterial soap and water prior to dressing change. Prim Dressing: KerraCel Ag Gelling Fiber Dressing, 2x2 in (silver alginate) 1 x Per Day/15 Days ary Discharge Instructions: Apply silver alginate to wound bed as instructed Secondary Dressing: Woven Gauze Sponges 2x2 in 1 x Per Day/15 Days Discharge Instructions: Apply over primary dressing as directed. Secondary Dressing: Optifoam Non-Adhesive  Dressing, 4x4 in 1 x Per Day/15 Days Discharge Instructions: Apply as donut over dressing Secured With: Conforming Stretch Gauze Bandage, Sterile 2x75 (in/in) 1 x Per Day/15 Days Discharge Instructions: Secure with stretch gauze as directed. Secured With: [employer]  Surgical T ape, 2x10 (in/yd) 1 x Per Day/15 Days Discharge Instructions: Secure dressing with tape as directed. WOUND #3: - T Great Wound Laterality: Right oe Cleanser: Soap and Water 1 x Per Day/15 Days Discharge Instructions: May shower and wash wound with dial antibacterial soap and water prior to dressing change. Prim Dressing: KerraCel Ag Gelling Fiber Dressing, 2x2 in (silver alginate) 1 x Per Day/15 Days ary Discharge Instructions: Apply silver alginate to wound bed as instructed Secondary Dressing: Woven Gauze Sponges 2x2 in 1 x Per Day/15 Days Discharge Instructions: Apply over primary dressing as directed. Secondary Dressing: Optifoam Non-Adhesive Dressing, 4x4 in 1 x Per Day/15 Days Discharge Instructions: Apply as donut over dressing Secured With: Conforming Stretch Gauze Bandage, Sterile 2x75 (in/in) 1 x Per Day/15 Days Discharge Instructions: Secure with stretch gauze as directed. Secured With: [employer] Surgical T ape, 2x10 (in/yd) 1 x Per Day/15 Days Discharge Instructions: Secure dressing with tape as directed. WOUND #4: - T Second Wound Laterality: Right oe Cleanser: Soap and Water 1 x Per Day/15 Days Discharge Instructions: May shower and wash wound with dial antibacterial soap and water prior to dressing change. Prim Dressing: KerraCel Ag Gelling Fiber Dressing, 2x2 in (silver alginate) 1 x Per Day/15 Days ary Discharge Instructions: Apply silver alginate to wound bed as instructed Secondary Dressing: Woven Gauze Sponges 2x2 in 1 x Per Day/15 Days Discharge Instructions: Apply over primary dressing as directed. Secondary Dressing: Optifoam Non-Adhesive Dressing, 4x4 in 1 x Per Day/15 Days Discharge  Instructions: Apply as donut over dressing Secured With: Conforming Stretch Gauze Bandage, Sterile 2x75 (in/in) 1 x Per Day/15 Days Discharge Instructions: Secure with stretch gauze as directed. Secured With: Transpore Surgical T ape, 2x10 (in/yd) 1 x Per Day/15 Days Discharge Instructions: Secure dressing with tape as directed. 1. In office sharp debridement 2. Offloading shoe to the right 3. Silver alginate 4. Follow-up in 1 week for contact cast to the right Electronic Signature(s) Signed: 05/17/2021 9:44:39 AM By: Geralyn CorwinHoffman, Guila Owensby DO Entered By: Geralyn CorwinHoffman, Daphanie Oquendo on 05/17/2021 09:43:00 -------------------------------------------------------------------------------- HxROS Details Patient Name: Date of Service: [name] HaltUSH, WILLIA M C. 05/16/2021 9:00 A M Medical Record Number: [id number] Patient Account Number: [id number] Date of Birth/Sex: Treating RN: 10/26/1956 (65 y.o. Tammy SoursM) Deaton, Bobbi Primary Care Provider: Tally JoeSwayne, David Other Clinician: Referring Provider: Treating Provider/Extender: Andrey CampanileHoffman, Kasidy Gianino Swayne, David Weeks in Treatment: 0 Information Obtained From Patient Eyes Complaints and Symptoms: Positive for: Glasses / Contacts - reading Negative for: Dry Eyes; Vision Changes Medical History: Negative for: Cataracts; Glaucoma; Optic Neuritis Ear/Nose/Mouth/Throat Complaints and Symptoms: Positive for: Chronic sinus problems or rhinitis - minor changes Respiratory Complaints and Symptoms: Negative for: Chronic or frequent coughs; Shortness of Breath Medical History: Positive for: Sleep Apnea - reported ; uses CPAP Negative for: Aspiration; Asthma; Chronic Obstructive Pulmonary Disease (COPD); Pneumothorax; Tuberculosis Cardiovascular Complaints and Symptoms: Negative for: Chest pain Medical History: Positive for: Coronary Artery Disease - reported; Hypertension - reported Negative for: Angina; Arrhythmia; Congestive Heart Failure; Deep Vein Thrombosis; Hypotension;  Myocardial Infarction; Peripheral Arterial Disease; Peripheral Venous Disease; Phlebitis; Vasculitis Past Medical History Notes: blockages 2011 Gastrointestinal Complaints and Symptoms: Negative for: Frequent diarrhea; Nausea; Vomiting Medical History: Negative for: Cirrhosis ; Colitis; Crohns; Hepatitis A; Hepatitis B; Hepatitis C Genitourinary Complaints and Symptoms: Negative for: Frequent urination Medical History: Negative for: End Stage Renal Disease Integumentary (Skin) Complaints and Symptoms: Negative for: Wounds Medical History: Negative for: History of Burn Musculoskeletal Complaints and Symptoms: Negative for: Muscle Pain; Muscle Weakness Medical History: Positive for: Osteoarthritis -  knees Negative for: Gout; Rheumatoid Arthritis; Osteomyelitis Psychiatric Complaints and Symptoms: Negative for: Claustrophobia; Suicidal Medical History: 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 Negative for: Anorexia/bulimia Hematologic/Lymphatic Medical History: Negative for: Anemia; Hemophilia; Human Immunodeficiency Virus; Lymphedema; Sickle Cell Disease Endocrine Medical History: Positive for: Type II Diabetes - 2009 Negative for: Type I Diabetes Past Medical History Notes: wears monitor in skin that checks CBG constantly Time with diabetes: 2009 Treated with: Insulin Blood sugar tested every day: No Blood sugar testing results: Breakfast: 90-120 Immunological Medical History: Negative for: Lupus Erythematosus; Raynauds; Scleroderma Neurologic Medical History: Positive for: Neuropathy - minor Negative for: Dementia; Quadriplegia; Paraplegia; Seizure Disorder Oncologic Medical History: Negative for: Received Chemotherapy; Received Radiation Immunizations Pneumococcal Vaccine: Received Pneumococcal Vaccination: No Immunization Notes: up to date on pneumonia shots per his PCP Implantable Devices None Hospitalization / Surgery  History Type of Hospitalization/Surgery by [facility] urology stent ORIF of left 2nd toe Family and Social History Cancer: Yes - Mother; Heart Disease: Yes - Father; Hypertension: Yes - Father; Stroke: Yes - Siblings; Never smoker; Marital Status - Married; Alcohol Use: Rarely - beer; Drug Use: No History; Caffeine Use: Daily; Financial Concerns: No; Food, Clothing or Shelter Needs: No; Support System Lacking: No; Transportation Concerns: No Electronic Signature(s) Signed: 06/08/2021 8:38:18 PM By: Shawn Stall Signed: 06/10/2021 2:46:51 PM By: Geralyn Corwin DO Previous Signature: 05/17/2021 9:44:39 AM Version By: Geralyn Corwin DO Previous Signature: 06/06/2021 9:05:19 PM Version By: Shawn Stall Entered By: Shawn Stall on 06/08/2021 20:34:50 -------------------------------------------------------------------------------- SuperBill Details Patient Name: Date of Service: [name] Halt 05/16/2021 Medical Record Number: [id number] Patient Account Number: [id number] Date of Birth/Sex: Treating RN: December 28, 1955 (65 y.o. 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 L97.519 Non-pressure chronic ulcer of other part of right foot with unspecified severity E11.621 Type 2 diabetes mellitus with foot ulcer E11.40 Type 2 diabetes mellitus with diabetic neuropathy, unspecified Facility Procedures CPT4 Code: [id number] Description: 99213 - WOUND CARE VISIT-LEV 3 EST PT Modifier: 25 Quantity: 1 CPT4 Code: [id number] Description: 11042 - DEB SUBQ TISSUE 20 SQ CM/< ICD-10 Diagnosis Description L97.529 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 L97.519 Non-pressure chronic ulcer of other part of  right foot with unspecified severit Modifier: y Quantity: 1 Physician Procedures : CPT4 Code Description Modifier [id number] - WC PHYS LEVEL 4 - NEW PT ICD-10 Diagnosis Description L97.529 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 L97.519 Non-pressure chronic ulcer of other part of right foot  with unspecified severity E11.621 Type 2 diabetes mellitus with foot ulcer E11.40 Type 2 diabetes mellitus with diabetic neuropathy, unspecified Quantity: 1 : [id number] 11042 - WC PHYS SUBQ TISS 20 SQ CM ICD-10 Diagnosis Description L97.529 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

## 2021-06-13 ENCOUNTER — Encounter (HOSPITAL_BASED_OUTPATIENT_CLINIC_OR_DEPARTMENT_OTHER): Payer: 59 | Admitting: Internal Medicine

## 2021-06-13 ENCOUNTER — Other Ambulatory Visit: Payer: Self-pay

## 2021-06-13 DIAGNOSIS — L97529 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: Secondary | ICD-10-CM

## 2021-06-13 DIAGNOSIS — E11621 Type 2 diabetes mellitus with foot ulcer: Secondary | ICD-10-CM | POA: Diagnosis not present

## 2021-06-13 DIAGNOSIS — L97519 Non-pressure chronic ulcer of other part of right foot with unspecified severity: Secondary | ICD-10-CM

## 2021-06-13 NOTE — Progress Notes (Signed)
CONNER, MUEGGE ([id number]) Visit Report for 06/13/2021 Chief Complaint Document Details Patient Name: Date of Service: ZALAN, SHIDLER 06/13/2021 8:00 A M Medical Record Number: [id number] Patient Account Number: [id number] Date of Birth/Sex: Treating RN: 1955-11-28 (65 y.o. [name] Primary Care Provider: Tally Joe Other Clinician: Referring Provider: Treating Provider/Extender: Andrey Campanile in Treatment: 4 Information Obtained from: Patient Chief Complaint Bilateral toe wounds Electronic Signature(s) Signed: 06/13/2021 10:43:18 AM By: Geralyn Corwin DO Entered By: Geralyn Corwin on 06/13/2021 10:36:48 -------------------------------------------------------------------------------- Debridement Details Patient Name: Date of Service: [name] 06/13/2021 8:00 A M Medical Record Number: [id number] Patient Account Number: [id number] Date of Birth/Sex: Treating RN: 12/23/1955 (65 y.o. [name] Primary Care Provider: Tally Joe Other Clinician: Referring Provider: Treating Provider/Extender: Andrey Campanile in Treatment: 4 Debridement Performed for Assessment: Wound #2 Left T Great oe Performed By: Physician Geralyn Corwin, DO Debridement Type: Debridement Severity of Tissue Pre Debridement: Fat layer exposed Level of Consciousness (Pre-procedure): Awake and Alert Pre-procedure Verification/Time Out Yes - 08:40 Taken: Start Time: 08:41 Pain Control: Other : Benzocaine T Area Debrided (L x W): otal 1.4 (cm) x 0.8 (cm) = 1.12 (cm) Tissue and other material debrided: Non-Viable, Subcutaneous Level: Skin/Subcutaneous Tissue Debridement Description: Excisional Instrument: Curette Bleeding: Minimum Hemostasis Achieved: Pressure End Time: 08:43 Response to Treatment: Procedure was tolerated well Level of Consciousness (Post- Awake and Alert procedure): Post Debridement Measurements of Total  Wound Length: (cm) 1.4 Width: (cm) 0.8 Depth: (cm) 0.2 Volume: (cm) 0.176 Character of Wound/Ulcer Post Debridement: Stable Severity of Tissue Post Debridement: Fat layer exposed Post Procedure Diagnosis Same as Pre-procedure Electronic Signature(s) Signed: 06/13/2021 10:43:18 AM By: Geralyn Corwin DO Signed: 06/13/2021 3:40:56 PM By: Antonieta Iba Entered By: Antonieta Iba on 06/13/2021 08:47:05 -------------------------------------------------------------------------------- Debridement Details Patient Name: Date of Service: [name] 06/13/2021 8:00 A M Medical Record Number: [id number] Patient Account Number: [id number] Date of Birth/Sex: Treating RN: [birth year] (65 y.o. [name] Primary Care Provider: Tally Joe Other Clinician: Referring Provider: Treating Provider/Extender: Andrey Campanile in Treatment: 4 Debridement Performed for Assessment: Wound #4 Right T Second oe Performed By: Physician Geralyn Corwin, DO Debridement Type: Debridement Severity of Tissue Pre Debridement: Fat layer exposed Level of Consciousness (Pre-procedure): Awake and Alert Pre-procedure Verification/Time Out Yes - 08:40 Taken: Start Time: 08:43 Pain Control: Other : Benzocaine T Area Debrided (L x W): otal 0.4 (cm) x 0.5 (cm) = 0.2 (cm) Tissue and other material debrided: Non-Viable, Subcutaneous Level: Skin/Subcutaneous Tissue Debridement Description: Excisional Instrument: Curette Bleeding: Minimum Hemostasis Achieved: Pressure End Time: 08:45 Response to Treatment: Procedure was tolerated well Level of Consciousness (Post- Awake and Alert procedure): Post Debridement Measurements of Total Wound Length: (cm) 0.4 Width: (cm) 0.5 Depth: (cm) 0.1 Volume: (cm) 0.016 Character of Wound/Ulcer Post Debridement: Stable Severity of Tissue Post Debridement: Fat layer exposed Post Procedure Diagnosis Same as Pre-procedure Electronic  Signature(s) Signed: 06/13/2021 10:43:18 AM By: Geralyn Corwin DO Signed: 06/13/2021 3:40:56 PM By: Antonieta Iba Entered By: Antonieta Iba on 06/13/2021 08:47:42 -------------------------------------------------------------------------------- Debridement Details Patient Name: Date of Service: [name] 06/13/2021 8:00 A M Medical Record Number: [id number] Patient Account Number: [id number] Date of Birth/Sex: Treating RN: 04-Jul-1956 (65 y.o. [name] Primary Care Provider: Tally Joe Other Clinician: Referring Provider: Treating Provider/Extender: Andrey Campanile in Treatment: 4 Debridement Performed for Assessment: Wound #3 Right T Great oe Performed By: Physician Geralyn Corwin, DO Debridement Type: Debridement  Severity of Tissue Pre Debridement: Fat layer exposed Level of Consciousness (Pre-procedure): Awake and Alert Pre-procedure Verification/Time Out Yes - 08:40 Taken: Start Time: 08:45 Pain Control: Other : Benzocaine T Area Debrided (L x W): otal 1.4 (cm) x 1.7 (cm) = 2.38 (cm) Tissue and other material debrided: Non-Viable, Callus, Subcutaneous, Skin: Dermis Level: Skin/Subcutaneous Tissue Debridement Description: Excisional Instrument: Blade, Curette, Forceps, Scissors Bleeding: Minimum Hemostasis Achieved: Pressure End Time: 08:49 Response to Treatment: Procedure was tolerated well Level of Consciousness (Post- Awake and Alert procedure): Post Debridement Measurements of Total Wound Length: (cm) 1.4 Width: (cm) 1.7 Depth: (cm) 0.1 Volume: (cm) 0.187 Character of Wound/Ulcer Post Debridement: Stable Severity of Tissue Post Debridement: Fat layer exposed Post Procedure Diagnosis Same as Pre-procedure Electronic Signature(s) Signed: 06/13/2021 10:43:18 AM By: Geralyn Corwin DO Signed: 06/13/2021 3:40:56 PM By: Antonieta Iba Entered By: Antonieta Iba on 06/13/2021  08:49:32 -------------------------------------------------------------------------------- HPI Details Patient Name: Date of Service: [name] 06/13/2021 8:00 A M Medical Record Number: [id number] Patient Account Number: [id number] Date of Birth/Sex: Treating RN: [birth year] (65 y.o. [name] Primary Care Provider: Tally Joe Other Clinician: Referring Provider: Treating Provider/Extender: Andrey Campanile in Treatment: 4 History of Present Illness HPI Description: Admission 6/27 Mr. Eswin Worrell is a 65 year old male with a past medical history of insulin-dependent type 2 diabetes, CABG in 2011 and essential hypertension that presents to the clinic for bilateral toe wounds. He has wounds to his left great toe that started in May 2022. He also has wounds to his right great toe and second toe that started in September 2021. He noticed they started as blisters and eventually worsened. He is currently using antibiotic ointment and keeping them covered. He reports minimal pain but has decreased sensation to his feet bilaterally. He denies signs of infection. 7/5; patient presents for 1 week follow-up. He did not receive supplies from our wound supplier [facility]. He ordered some silver alginate on [employer] and has been using this to the wound beds. He has also been aggressively offloading the areas. Overall he reports doing well and would like to hold off on the total contact cast since there has been improvement to the wound beds. He currently denies signs of infection. 7/11; patient presents for 1 week follow-up. He has been using silver alginate to the wound beds but taking his dressing off at night. He would like to continue using his offloading shoe and hold off on the cast for now. He currently denies signs of infection. 7/18; patient presents for 1 week follow-up. He has been using silver alginate to the wound beds. He has no issues or complaints today. Been  using his offloading shoe to the right foot. He denies signs of infection. 7/25; patient presents for 1 week follow-up. He continues to use silver alginate to the wound beds. He was mowing his lawn on Saturday And was on his feet most of the day. He has been using his offloading shoe to the left foot. He denies signs of infection.He has not been able to obtain x-rays yet of his feet. Electronic Signature(s) Signed: 06/13/2021 10:43:18 AM By: Geralyn Corwin DO Entered By: Geralyn Corwin on 06/13/2021 10:38:22 -------------------------------------------------------------------------------- Physical Exam Details Patient Name: Date of Service: [name] 06/13/2021 8:00 A M Medical Record Number: [id number] Patient Account Number: [id number] Date of Birth/Sex: Treating RN: 1956-07-17 (65 y.o. [name] Primary Care Provider: Tally Joe Other Clinician: Referring Provider: Treating Provider/Extender: Andrey Campanile in  Treatment: 4 Constitutional respirations regular, non-labored and within target range for patient.. Cardiovascular 2+ dorsalis pedis/posterior tibialis pulses. Psychiatric pleasant and cooperative. Notes Right foot: Great toe and second toe have open wounds with nonviable tissue callus and granulation tissue present. The great toe has declined since last clinic visit and appearance. Left foot: Great toe has an open wound with nonviable tissue callus and granulation tissue present. No signs of infection. Electronic Signature(s) Signed: 06/13/2021 10:43:18 AM By: Geralyn Corwin DO Entered By: Geralyn Corwin on 06/13/2021 10:39:08 -------------------------------------------------------------------------------- Physician Orders Details Patient Name: Date of Service: [name] 06/13/2021 8:00 A M Medical Record Number: [id number] Patient Account Number: [id number] Date of Birth/Sex: Treating RN: [birth year] (65 y.o. [name] Primary Care Provider: Tally Joe Other Clinician: Referring Provider: Treating Provider/Extender: Andrey Campanile in Treatment: 4 Verbal / Phone Orders: No Diagnosis Coding ICD-10 Coding Code Description [phone] 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 L97.519 Non-pressure chronic ulcer of other part of right foot with unspecified severity E11.621 Type 2 diabetes mellitus with foot ulcer E11.40 Type 2 diabetes mellitus with diabetic neuropathy, unspecified Follow-up Appointments ppointment in 1 week. - with Dr. Mikey Bussing- Monday Return A Other: - [facility] [employer] for Supplies Bathing/ Shower/ Hygiene May shower and wash wound with soap and water. Edema Control - Lymphedema / SCD / Other Avoid standing for long periods of time. Off-Loading Wedge shoe to: - front offloader on right foot Additional Orders / Instructions Follow Nutritious Diet - -Increase protein, 100-120g daily -Keep blood sugars monitored Other: - Please obtain X-ray prior to next appointment Wound Treatment Wound #2 - T Great oe Wound Laterality: Left Cleanser: Soap and Water 1 x Per Day/15 Days Discharge Instructions: May shower and wash wound with dial antibacterial soap and water prior to dressing change. Prim Dressing: KerraCel Ag Gelling Fiber Dressing, 2x2 in (silver alginate) 1 x Per Day/15 Days ary Discharge Instructions: Apply silver alginate to wound bed as instructed Secondary Dressing: Woven Gauze Sponges 2x2 in 1 x Per Day/15 Days Discharge Instructions: Apply over primary dressing as directed. Secondary Dressing: Optifoam Non-Adhesive Dressing, 4x4 in 1 x Per Day/15 Days Discharge Instructions: Apply as donut over dressing Secured With: Conforming Stretch Gauze Bandage, Sterile 2x75 (in/in) 1 x Per Day/15 Days Discharge Instructions: Secure with stretch gauze as directed. Secured With: Transpore Surgical Tape, 2x10 (in/yd) 1 x Per  Day/15 Days Discharge Instructions: Secure dressing with tape as directed. Wound #3 - T Great oe Wound Laterality: Right Cleanser: Soap and Water 1 x Per Day/15 Days Discharge Instructions: May shower and wash wound with dial antibacterial soap and water prior to dressing change. Prim Dressing: KerraCel Ag Gelling Fiber Dressing, 2x2 in (silver alginate) 1 x Per Day/15 Days ary Discharge Instructions: Apply silver alginate to wound bed as instructed Secondary Dressing: Woven Gauze Sponges 2x2 in 1 x Per Day/15 Days Discharge Instructions: Apply over primary dressing as directed. Secondary Dressing: Optifoam Non-Adhesive Dressing, 4x4 in 1 x Per Day/15 Days Discharge Instructions: Apply as donut over dressing Secured With: Conforming Stretch Gauze Bandage, Sterile 2x75 (in/in) 1 x Per Day/15 Days Discharge Instructions: Secure with stretch gauze as directed. Secured With: Transpore Surgical Tape, 2x10 (in/yd) 1 x Per Day/15 Days Discharge Instructions: Secure dressing with tape as directed. Wound #4 - T Second oe Wound Laterality: Right Cleanser: Soap and Water 1 x Per Day/15 Days Discharge Instructions: May shower and wash wound with dial antibacterial soap and water  prior to dressing change. Prim Dressing: KerraCel Ag Gelling Fiber Dressing, 2x2 in (silver alginate) 1 x Per Day/15 Days ary Discharge Instructions: Apply silver alginate to wound bed as instructed Secondary Dressing: Woven Gauze Sponges 2x2 in 1 x Per Day/15 Days Discharge Instructions: Apply over primary dressing as directed. Secondary Dressing: Optifoam Non-Adhesive Dressing, 4x4 in 1 x Per Day/15 Days Discharge Instructions: Apply as donut over dressing Secured With: Conforming Stretch Gauze Bandage, Sterile 2x75 (in/in) 1 x Per Day/15 Days Discharge Instructions: Secure with stretch gauze as directed. Secured With: Transpore Surgical Tape, 2x10 (in/yd) 1 x Per Day/15 Days Discharge Instructions: Secure dressing with  tape as directed. Electronic Signature(s) Signed: 06/13/2021 10:43:18 AM By: Geralyn Corwin DO Entered By: Geralyn Corwin on 06/13/2021 10:39:39 -------------------------------------------------------------------------------- Problem List Details Patient Name: Date of Service: [name] 06/13/2021 8:00 A M Medical Record Number: [id number] Patient Account Number: [id number] Date of Birth/Sex: Treating RN: [birth year] (65 y.o. [name] Primary Care Provider: Tally Joe Other Clinician: Referring Provider: Treating Provider/Extender: Andrey Campanile in Treatment: 4 Active Problems ICD-10 Encounter Code Description Active Date MDM Diagnosis L97.529 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 05/16/2021 No Yes L97.519 Non-pressure chronic ulcer of other part of right foot with unspecified severity 05/16/2021 No Yes E11.621 Type 2 diabetes mellitus with foot ulcer 05/17/2021 No Yes E11.40 Type 2 diabetes mellitus with diabetic neuropathy, unspecified 05/17/2021 No Yes Inactive Problems Resolved Problems Electronic Signature(s) Signed: 06/13/2021 10:43:18 AM By: Geralyn Corwin DO Previous Signature: 06/13/2021 8:39:57 AM Version By: Antonieta Iba Entered By: Geralyn Corwin on 06/13/2021 10:36:27 -------------------------------------------------------------------------------- Progress Note Details Patient Name: Date of Service: [name] 06/13/2021 8:00 A M Medical Record Number: [id number] Patient Account Number: [id number] Date of Birth/Sex: Treating RN: 04/02/1956 (65 y.o. [name] Primary Care Provider: Tally Joe Other Clinician: Referring Provider: Treating Provider/Extender: Andrey Campanile in Treatment: 4 Subjective Chief Complaint Information obtained from Patient Bilateral toe wounds History of Present Illness (HPI) Admission 6/27 Mr. Octave Montrose is a  65 year old male with a past medical history of insulin-dependent type 2 diabetes, CABG in 2011 and essential hypertension that presents to the clinic for bilateral toe wounds. He has wounds to his left great toe that started in May 2022. He also has wounds to his right great toe and second toe that started in September 2021. He noticed they started as blisters and eventually worsened. He is currently using antibiotic ointment and keeping them covered. He reports minimal pain but has decreased sensation to his feet bilaterally. He denies signs of infection. 7/5; patient presents for 1 week follow-up. He did not receive supplies from our wound supplier [facility]. He ordered some silver alginate on [employer] and has been using this to the wound beds. He has also been aggressively offloading the areas. Overall he reports doing well and would like to hold off on the total contact cast since there has been improvement to the wound beds. He currently denies signs of infection. 7/11; patient presents for 1 week follow-up. He has been using silver alginate to the wound beds but taking his dressing off at night. He would like to continue using his offloading shoe and hold off on the cast for now. He currently denies signs of infection. 7/18; patient presents for 1 week follow-up. He has been using silver alginate to the wound beds. He has no issues or complaints today. Been using his offloading shoe to  the right foot. He denies signs of infection. 7/25; patient presents for 1 week follow-up. He continues to use silver alginate to the wound beds. He was mowing his lawn on Saturday And was on his feet most of the day. He has been using his offloading shoe to the left foot. He denies signs of infection.He has not been able to obtain x-rays yet of his feet. Patient History Information obtained from Patient. Family History Cancer - Mother, Heart Disease - Father, Hypertension - Father, Stroke - Siblings. Social  History Never smoker, Marital Status - Married, Alcohol Use - Rarely - beer, Drug Use - No History, Caffeine Use - Daily. Medical History Eyes Denies history of Cataracts, Glaucoma, Optic Neuritis Hematologic/Lymphatic Denies history of Anemia, Hemophilia, Human Immunodeficiency Virus, Lymphedema, Sickle Cell Disease Respiratory Patient has history of Sleep Apnea - reported ; uses CPAP Denies history of Aspiration, Asthma, Chronic Obstructive Pulmonary Disease (COPD), Pneumothorax, Tuberculosis Cardiovascular Patient has history of Coronary Artery Disease - reported, Hypertension - reported Denies history of Angina, Arrhythmia, Congestive Heart Failure, Deep Vein Thrombosis, Hypotension, Myocardial Infarction, Peripheral Arterial Disease, Peripheral Venous Disease, Phlebitis, Vasculitis Gastrointestinal Denies history of Cirrhosis , Colitis, Crohnoos, Hepatitis A, Hepatitis B, Hepatitis C Endocrine Patient has history of Type II Diabetes - 2009 Denies history of Type I Diabetes Genitourinary Denies history of End Stage Renal Disease Immunological Denies history of Lupus Erythematosus, Raynaudoos, Scleroderma Integumentary (Skin) Denies history of History of Burn Musculoskeletal Patient has history of Osteoarthritis - knees Denies history of Gout, Rheumatoid Arthritis, Osteomyelitis Neurologic Patient has history of Neuropathy - minor Denies history of Dementia, Quadriplegia, Paraplegia, Seizure Disorder Oncologic Denies history of Received Chemotherapy, Received Radiation Psychiatric Patient has history of 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 Denies history of Anorexia/bulimia Hospitalization/Surgery History - by pass heart. - urology stent. - ORIF of left 2nd toe. Medical A Surgical History Notes nd Cardiovascular blockages 2011 Endocrine wears monitor in skin that checks CBG constantly Objective Constitutional respirations regular, non-labored and  within target range for patient.. Vitals Time Taken: 8:12 AM, Height: 72 in, Source: Stated, Weight: 271 lbs, Source: Stated, BMI: 36.8, Temperature: 97.8 F, Pulse: 75 bpm, Respiratory Rate: 18 breaths/min, Blood Pressure: 154/92 mmHg, Capillary Blood Glucose: 170 mg/dl. General Notes: glucose per pt report the am prior to insulin dose Cardiovascular 2+ dorsalis pedis/posterior tibialis pulses. Psychiatric pleasant and cooperative. General Notes: Right foot: Great toe and second toe have open wounds with nonviable tissue callus and granulation tissue present. The great toe has declined since last clinic visit and appearance. Left foot: Great toe has an open wound with nonviable tissue callus and granulation tissue present. No signs of infection. Integumentary (Hair, Skin) Wound #2 status is Open. Original cause of wound was Gradually Appeared. The date acquired was: 03/20/2021. The wound has been in treatment 4 weeks. The wound is located on the Left T Great. The wound measures 1.4cm length x 0.8cm width x 0.2cm depth; 0.88cm^2 area and 0.176cm^3 volume. There is Fat oe Layer (Subcutaneous Tissue) exposed. There is no tunneling or undermining noted. There is a medium amount of serosanguineous drainage noted. The wound margin is thickened. There is large (67-100%) red, pink, pale granulation within the wound bed. There is a small (1-33%) amount of necrotic tissue within the wound bed including Adherent Slough. Wound #3 status is Open. Original cause of wound was Gradually Appeared. The date acquired was: 07/21/2020. The wound has been in treatment 4 weeks. The wound is located on the Right  T Great. The wound measures 1.4cm length x 1.7cm width x 0.1cm depth; 1.869cm^2 area and 0.187cm^3 volume. There is Fat oe Layer (Subcutaneous Tissue) exposed. There is no tunneling or undermining noted. There is a medium amount of serosanguineous drainage noted. The wound margin is thickened. There is large  (67-100%) red granulation within the wound bed. There is a small (1-33%) amount of necrotic tissue within the wound bed including Adherent Slough. General Notes: blistered skin lateral side of wound edge Wound #4 status is Open. Original cause of wound was Gradually Appeared. The date acquired was: 05/02/2021. The wound has been in treatment 4 weeks. The wound is located on the Right T Second. The wound measures 0.4cm length x 0.5cm width x 0.1cm depth; 0.157cm^2 area and 0.016cm^3 volume. There is oe Fat Layer (Subcutaneous Tissue) exposed. There is no tunneling noted, however, there is undermining starting at 9:00 and ending at 12:00 with a maximum distance of 0.3cm. There is a medium amount of serosanguineous drainage noted. The wound margin is distinct with the outline attached to the wound base. There is large (67-100%) red, pink granulation within the wound bed. There is no necrotic tissue within the wound bed. Assessment Active Problems ICD-10 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 Non-pressure chronic ulcer of other part of right foot with unspecified severity Type 2 diabetes mellitus with foot ulcer Type 2 diabetes mellitus with diabetic neuropathy, unspecified Patient's left foot wound looks improved since last clinic visit in appearance and size. He has been using his offloading shoe to this area. I debrided nonviable tissue and recommended continuing silver alginate. T his right great toe the wound bed has declined in appearance. The area is macerated. I debrided o nonviable tissue. I recommended now switching his offloading shoe to the right side. Ideally he would be put in a cast however I would like to obtain x-rays to assure there are no obvious bony changes to these areas suggesting osteo. I Recommended aggressive offloading to both feet. I think mowing the lawn and being in the heat has worsened his wounds. Procedures Wound  #2 Pre-procedure diagnosis of Wound #2 is a Diabetic Wound/Ulcer of the Lower Extremity located on the Left T Great .Severity of Tissue Pre Debridement is: oe Fat layer exposed. There was a Excisional Skin/Subcutaneous Tissue Debridement with a total area of 1.12 sq cm performed by Geralyn Corwin, DO. With the following instrument(s): Curette to remove Non-Viable tissue/material. Material removed includes Subcutaneous Tissue after achieving pain control using Other (Benzocaine). No specimens were taken. A time out was conducted at 08:40, prior to the start of the procedure. A Minimum amount of bleeding was controlled with Pressure. The procedure was tolerated well. Post Debridement Measurements: 1.4cm length x 0.8cm width x 0.2cm depth; 0.176cm^3 volume. Character of Wound/Ulcer Post Debridement is stable. Severity of Tissue Post Debridement is: Fat layer exposed. Post procedure Diagnosis Wound #2: Same as Pre-Procedure Wound #3 Pre-procedure diagnosis of Wound #3 is a Diabetic Wound/Ulcer of the Lower Extremity located on the Right T Great .Severity of Tissue Pre Debridement is: oe Fat layer exposed. There was a Excisional Skin/Subcutaneous Tissue Debridement with a total area of 2.38 sq cm performed by Geralyn Corwin, DO. With the following instrument(s): Blade, Curette, Forceps, and Scissors to remove Non-Viable tissue/material. Material removed includes Callus, Subcutaneous Tissue, and Skin: Dermis after achieving pain control using Other (Benzocaine). No specimens were taken. A time out was conducted at 08:40, prior to the start  of the procedure. A Minimum amount of bleeding was controlled with Pressure. The procedure was tolerated well. Post Debridement Measurements: 1.4cm length x 1.7cm width x 0.1cm depth; 0.187cm^3 volume. Character of Wound/Ulcer Post Debridement is stable. Severity of Tissue Post Debridement is: Fat layer exposed. 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 There was a Excisional Skin/Subcutaneous Tissue Debridement with a total area of 0.2 sq cm performed by Geralyn CorwinHoffman, Peityn Payton, DO. With the following instrument(s): Curette to remove Non-Viable tissue/material. Material removed includes Subcutaneous Tissue after achieving pain control using Other (Benzocaine). No specimens were taken. A time out was conducted at 08:40, prior to the start of the procedure. A Minimum amount of bleeding was controlled with Pressure. The procedure was tolerated well. Post Debridement Measurements: 0.4cm length x 0.5cm width x 0.1cm depth; 0.016cm^3 volume. Character of Wound/Ulcer Post Debridement is stable. Severity of Tissue Post Debridement is: Fat layer exposed. Post procedure Diagnosis Wound #4: Same as Pre-Procedure Plan Follow-up Appointments: Return Appointment in 1 week. - with Dr. Mikey BussingHoffman- Monday Other: - [employer] for Supplies Bathing/ Shower/ Hygiene: May shower and wash wound with soap and water. Edema Control - Lymphedema / SCD / Other: Avoid standing for long periods of time. Off-Loading: Wedge shoe to: - front offloader on right foot Additional Orders / Instructions: Follow Nutritious Diet - -Increase protein, 100-120g daily -Keep blood sugars monitored Other: - Please obtain X-ray prior to next appointment WOUND #2: - T Great Wound Laterality: Left oe Cleanser: Soap and Water 1 x Per Day/15 Days Discharge Instructions: May shower and wash wound with dial antibacterial soap and water prior to dressing change. Prim Dressing: KerraCel Ag Gelling Fiber Dressing, 2x2 in (silver alginate) 1 x Per Day/15 Days ary Discharge Instructions: Apply silver alginate to wound bed as instructed Secondary Dressing: Woven Gauze Sponges 2x2 in 1 x Per Day/15  Days Discharge Instructions: Apply over primary dressing as directed. Secondary Dressing: Optifoam Non-Adhesive Dressing, 4x4 in 1 x Per Day/15 Days Discharge Instructions: Apply as donut over dressing Secured With: Conforming Stretch Gauze Bandage, Sterile 2x75 (in/in) 1 x Per Day/15 Days Discharge Instructions: Secure with stretch gauze as directed. Secured With: Transpore Surgical T ape, 2x10 (in/yd) 1 x Per Day/15 Days Discharge Instructions: Secure dressing with tape as directed. WOUND #3: - T Great Wound Laterality: Right oe Cleanser: Soap and Water 1 x Per Day/15 Days Discharge Instructions: May shower and wash wound with dial antibacterial soap and water prior to dressing change. Prim Dressing: KerraCel Ag Gelling Fiber Dressing, 2x2 in (silver alginate) 1 x Per Day/15 Days ary Discharge Instructions: Apply silver alginate to wound bed as instructed Secondary Dressing: Woven Gauze Sponges 2x2 in 1 x Per Day/15 Days Discharge Instructions: Apply over primary dressing as directed. Secondary Dressing: Optifoam Non-Adhesive Dressing, 4x4 in 1 x Per Day/15 Days Discharge Instructions: Apply as donut over dressing Secured With: Conforming Stretch Gauze Bandage, Sterile 2x75 (in/in) 1 x Per Day/15 Days Discharge Instructions: Secure with stretch gauze as directed. Secured With: Transpore Surgical T ape, 2x10 (in/yd) 1 x Per Day/15 Days Discharge Instructions: Secure dressing with tape as directed. WOUND #4: - T Second Wound Laterality: Right oe Cleanser: Soap and Water 1 x Per Day/15 Days Discharge Instructions: May shower and wash wound with dial antibacterial soap and water prior to dressing  change. Prim Dressing: KerraCel Ag Gelling Fiber Dressing, 2x2 in (silver alginate) 1 x Per Day/15 Days ary Discharge Instructions: Apply silver alginate to wound bed as instructed Secondary Dressing: Woven Gauze Sponges 2x2 in 1 x Per Day/15 Days Discharge Instructions: Apply over primary  dressing as directed. Secondary Dressing: Optifoam Non-Adhesive Dressing, 4x4 in 1 x Per Day/15 Days Discharge Instructions: Apply as donut over dressing Secured With: Conforming Stretch Gauze Bandage, Sterile 2x75 (in/in) 1 x Per Day/15 Days Discharge Instructions: Secure with stretch gauze as directed. Secured With: Transpore Surgical T ape, 2x10 (in/yd) 1 x Per Day/15 Days Discharge Instructions: Secure dressing with tape as directed. 1. In office sharp debridement 2. Aggressive offloading 3. Continue silver alginate 4. Offloading shoe to the right foot now 5. Obtain x-rays - Feet bilaterally Electronic Signature(s) Signed: 06/13/2021 10:43:18 AM By: Geralyn Corwin DO Entered By: Geralyn Corwin on 06/13/2021 10:42:45 -------------------------------------------------------------------------------- HxROS Details Patient Name: Date of Service: [name] 06/13/2021 8:00 A M Medical Record Number: [id number] Patient Account Number: [id number] Date of Birth/Sex: Treating RN: [birth year] (65 y.o. Lucious Groves Primary Care Provider: Tally Joe Other Clinician: Referring Provider: Treating Provider/Extender: Andrey Campanile in Treatment: 4 Information Obtained From Patient Eyes Medical History: Negative for: Cataracts; Glaucoma; Optic Neuritis Hematologic/Lymphatic Medical History: Negative for: Anemia; Hemophilia; Human Immunodeficiency Virus; Lymphedema; Sickle Cell Disease Respiratory Medical History: Positive for: Sleep Apnea - reported ; uses CPAP Negative for: Aspiration; Asthma; Chronic Obstructive Pulmonary Disease (COPD); Pneumothorax; Tuberculosis Cardiovascular Medical History: Positive for: Coronary Artery Disease - reported; Hypertension - reported Negative for: Angina; Arrhythmia; Congestive Heart Failure; Deep Vein Thrombosis; Hypotension; Myocardial Infarction; Peripheral Arterial Disease; Peripheral Venous Disease;  Phlebitis; Vasculitis Past Medical History Notes: blockages 2011 Gastrointestinal Medical History: Negative for: Cirrhosis ; Colitis; Crohns; Hepatitis A; Hepatitis B; Hepatitis C Endocrine Medical History: Positive for: Type II Diabetes - 2009 Negative for: Type I Diabetes Past Medical History Notes: wears monitor in skin that checks CBG constantly Time with diabetes: 2009 Treated with: Insulin Blood sugar tested every day: No Blood sugar testing results: Breakfast: 90-120 Genitourinary Medical History: Negative for: End Stage Renal Disease Immunological Medical History: Negative for: Lupus Erythematosus; Raynauds; Scleroderma Integumentary (Skin) Medical History: Negative for: History of Burn Musculoskeletal Medical History: Positive for: Osteoarthritis - knees Negative for: Gout; Rheumatoid Arthritis; Osteomyelitis Neurologic Medical History: Positive for: Neuropathy - minor Negative for: Dementia; Quadriplegia; Paraplegia; Seizure Disorder Oncologic Medical History: Negative for: Received Chemotherapy; Received Radiation Psychiatric Medical History: 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 Negative for: Anorexia/bulimia Immunizations Pneumococcal Vaccine: Received Pneumococcal Vaccination: Yes Received Pneumococcal Vaccination On or After 60th Birthday: Yes Immunization Notes: up to date on pneumonia shots per his PCP Implantable Devices None Hospitalization / Surgery History Type of Hospitalization/Surgery by pass heart urology stent ORIF of left 2nd toe Family and Social History Cancer: Yes - Mother; Heart Disease: Yes - Father; Hypertension: Yes - Father; Stroke: Yes - Siblings; Never smoker; Marital Status - Married; Alcohol Use: Rarely - beer; Drug Use: No History; Caffeine Use: Daily; Financial Concerns: No; Food, Clothing or Shelter Needs: No; Support System Lacking: No; Transportation Concerns: No Electronic  Signature(s) Signed: 06/13/2021 10:43:18 AM By: Geralyn Corwin DO Signed: 06/13/2021 3:23:29 PM By: Fonnie Mu RN Entered By: Geralyn Corwin on 06/13/2021 10:38:29 -------------------------------------------------------------------------------- SuperBill Details Patient Name: Date of Service: [name] 06/13/2021 Medical Record Number: [id number] Patient Account Number: [id number] Date of Birth/Sex: Treating RN: 11-27-1955 (65 y.o. 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 L97.519 Non-pressure chronic ulcer of other part of right foot with unspecified severity E11.621 Type 2 diabetes mellitus with foot ulcer E11.40 Type 2 diabetes mellitus with diabetic neuropathy, unspecified Facility Procedures CPT4 Code: [id number] Description: 11042 - DEB SUBQ TISSUE 20 SQ CM/< ICD-10 Diagnosis Description L97.529 Non-pressure chronic ulcer of other part of left foot with unspecified severit L97.519 Non-pressure chronic ulcer of other part of right foot with unspecified severi Modifier: y ty Quantity: 1 Physician Procedures Electronic Signature(s) Signed: 06/13/2021 10:43:18 AM By: Geralyn Corwin DO Entered By: Geralyn Corwin on 06/13/2021 10:42:52

## 2021-06-13 NOTE — Progress Notes (Signed)
SAAFIR, ABDULLAH ([id number]) Visit Report for 06/13/2021 Arrival Information Details Patient Name: Date of Service: VERLE, WHEELING 06/13/2021 8:00 A M Medical Record Number: [id number] Patient Account Number: [id number] Date of Birth/Sex: Treating RN: [birth year] (65 y.o. Ulyses Amor, Vaughan Basta Primary Care Pinki Rottman: Antony Contras Other Clinician: Referring Shelby Peltz: Treating Dvid Pendry/Extender: Olivia Mackie in Treatment: 4 Visit Information History Since Last Visit Added or deleted any medications: No Patient Arrived: Ambulatory Any new allergies or adverse reactions: No Arrival Time: 08:08 Had a fall or experienced change in No Accompanied By: self activities of daily living that may affect Transfer Assistance: None risk of falls: Patient Identification Verified: Yes Signs or symptoms of abuse/neglect since last visito No Secondary Verification Process Completed: Yes Hospitalized since last visit: No Patient Requires Transmission-Based Precautions: No Implantable device outside of the clinic excluding No Patient Has Alerts: No cellular tissue based products placed in the center since last visit: Has Dressing in Place as Prescribed: Yes Pain Present Now: No Electronic Signature(s) Signed: 06/13/2021 4:44:37 PM By: Baruch Gouty RN, BSN Entered By: Baruch Gouty on 06/13/2021 08:10:41 -------------------------------------------------------------------------------- Encounter Discharge Information Details Patient Name: Date of Service: Aneta Mins. 06/13/2021 8:00 A M Medical Record Number: [id number] Patient Account Number: [id number] Date of Birth/Sex: Treating RN: [birth year] (65 y.o. Ernestene Mention Primary Care Edrei Norgaard: Antony Contras Other Clinician: Referring Samaiyah Howes: Treating Juley Giovanetti/Extender: Olivia Mackie in Treatment: 4 Encounter Discharge Information Items Post Procedure Vitals Discharge Condition:  Stable Temperature (F): 97.8 Ambulatory Status: Ambulatory Pulse (bpm): 75 Discharge Destination: Home Respiratory Rate (breaths/min): 18 Transportation: Private Auto Blood Pressure (mmHg): 154/92 Accompanied By: self Schedule Follow-up Appointment: Yes Clinical Summary of Care: Patient Declined Electronic Signature(s) Signed: 06/13/2021 4:44:37 PM By: Baruch Gouty RN, BSN Entered By: Baruch Gouty on 06/13/2021 09:07:25 -------------------------------------------------------------------------------- Lower Extremity Assessment Details Patient Name: Date of Service: Aneta Mins. 06/13/2021 8:00 A M Medical Record Number: [id number] Patient Account Number: [id number] Date of Birth/Sex: Treating RN: Apr 17, 1956 (65 y.o. Ernestene Mention Primary Care Geena Weinhold: Antony Contras Other Clinician: Referring Algenis Ballin: Treating Li Fragoso/Extender: Olivia Mackie in Treatment: 4 Edema Assessment Assessed: Shirlyn Goltz: No] Patrice Paradise: No] Edema: [Left: Yes] [Right: Yes] Calf Left: Right: Point of Measurement: 34 cm From Medial Instep 46 cm 45.6 cm Ankle Left: Right: Point of Measurement: 10 cm From Medial Instep 23.5 cm 23.5 cm Vascular Assessment Pulses: Dorsalis Pedis Palpable: [Left:Yes] [Right:Yes] Electronic Signature(s) Signed: 06/13/2021 4:44:37 PM By: Baruch Gouty RN, BSN Entered By: Baruch Gouty on 06/13/2021 08:18:26 -------------------------------------------------------------------------------- Multi Wound Chart Details Patient Name: Date of Service: Aneta Mins. 06/13/2021 8:00 A M Medical Record Number: [id number] Patient Account Number: [id number] Date of Birth/Sex: Treating RN: May 25, 1956 (65 y.o. Marcheta Grammes Primary Care Joffrey Kerce: Antony Contras Other Clinician: Referring Jaylynn Siefert: Treating Windsor Goeken/Extender: Olivia Mackie in Treatment: 4 Vital Signs Height(in): 65 Capillary Blood Glucose(mg/dl):  170 Weight(lbs): 271 Pulse(bpm): 55 Body Mass Index(BMI): 12 Blood Pressure(mmHg): 154/92 Temperature(F): 97.8 Respiratory Rate(breaths/min): 18 Photos: [2:Left T Great oe] [3:Right T Great oe] [4:Right T Second oe] Wound Location: [2:Gradually Appeared] [3:Gradually Appeared] [4:Gradually Appeared] Wounding Event: [2:Diabetic Wound/Ulcer of the Lower] [3:Diabetic Wound/Ulcer of the Lower] Primary Etiology: [2:Extremity Sleep Apnea, Coronary Artery] [3:Extremity Sleep Apnea, Coronary Artery] [4:Extremity Sleep Apnea, Coronary Artery] Comorbid History: [2:Disease, Hypertension, Type II Diabetes, Osteoarthritis, Neuropathy, Confinement Anxiety 03/20/2021] [3:Disease, Hypertension, Type II Disease, Hypertension, Type II Diabetes, Osteoarthritis, Neuropathy, Confinement Anxiety 07/21/2020]  [4:Confinement Anxiety 05/02/2021] Date Acquired: [2:4] [  3:4] [4:4] Weeks of Treatment: [2:Open] [3:Open] [4:Open] Wound Status: [2:1.4x0.8x0.2] [3:1.4x1.7x0.1] [4:0.4x0.5x0.1] Measurements L x W x D (cm) [2:0.88] [3:1.869] [4:0.157] A (cm) : rea [2:0.176] [3:0.187] [4:0.016] Volume (cm) : [2:42.60%] [3:-76.30%] [4:19.90%] % Reduction in A [2:rea: 42.50%] [3:55.90%] [4:59.00%] % Reduction in Volume: [4:9] Starting Position 1 (o'clock): [4:12] Ending Position 1 (o'clock): [4:0.3] Maximum Distance 1 (cm): [2:No] [3:No] [4:Yes] Undermining: [2:Grade 2] [3:Grade 2] [4:Grade 2] Classification: [2:Medium] [3:Medium] [4:Medium] Exudate A mount: [2:Serosanguineous] [3:Serosanguineous] [4:Serosanguineous] Exudate Type: [2:red, brown] [3:red, brown] [4:red, brown] Exudate Color: [2:Thickened] [3:Thickened] [4:Distinct, outline attached] Wound Margin: [2:Large (67-100%)] [3:Large (67-100%)] [4:Large (67-100%)] Granulation A mount: [2:Red, Pink, Pale] [3:Red] [4:Red, Pink] Granulation Quality: [2:Small (1-33%)] [3:Small (1-33%)] [4:None Present (0%)] Necrotic A mount: [2:Fat Layer (Subcutaneous Tissue): Yes Fat  Layer (Subcutaneous Tissue): Yes Fat Layer (Subcutaneous Tissue): Yes] Exposed Structures: [2:Fascia: No Tendon: No Muscle: No Joint: No Bone: No Small (1-33%)] [3:Fascia: No Tendon: No Muscle: No Joint: No Bone: No None] [4:Fascia: No Tendon: No Muscle: No Joint: No Bone: No Small (1-33%)] Epithelialization: [2:Debridement - Excisional] [3:Debridement - Excisional] [4:Debridement - Excisional] Debridement: Pre-procedure Verification/Time Out 08:40 [3:08:40] [4:08:40] Taken: [2:Other] [3:Other] [4:Other] Pain Control: [2:Subcutaneous] [3:Callus, Subcutaneous] [4:Subcutaneous] Tissue Debrided: [2:Skin/Subcutaneous Tissue] [3:Skin/Subcutaneous Tissue] [4:Skin/Subcutaneous Tissue] Level: [2:1.12] [3:2.38] [4:0.2] Debridement A (sq cm): [2:rea Curette] [3:Blade, Curette, Forceps, Scissors] [4:Curette] Instrument: [2:Minimum] [3:Minimum] [4:Minimum] Bleeding: [2:Pressure] [3:Pressure] [4:Pressure] Hemostasis A chieved: [2:Procedure was tolerated well] [3:Procedure was tolerated well] [4:Procedure was tolerated well] Debridement Treatment Response: [2:1.4x0.8x0.2] [3:1.4x1.7x0.1] [4:0.4x0.5x0.1] Post Debridement Measurements L x W x D (cm) [2:0.176] [3:0.187] [4:0.016] Post Debridement Volume: (cm) [2:N/A] [3:blistered skin lateral side of wound] [4:N/A] Assessment Notes: [2:Debridement] [3:edge Debridement] [4:Debridement] Treatment Notes Wound #2 (Toe Great) Wound Laterality: Left Cleanser Soap and Water Discharge Instruction: May shower and wash wound with dial antibacterial soap and water prior to dressing change. Peri-Wound Care Topical Primary Dressing KerraCel Ag Gelling Fiber Dressing, 2x2 in (silver alginate) Discharge Instruction: Apply silver alginate to wound bed as instructed Secondary Dressing Woven Gauze Sponges 2x2 in Discharge Instruction: Apply over primary dressing as directed. Optifoam Non-Adhesive Dressing, 4x4 in Discharge Instruction: Apply as donut over  dressing Secured With Conforming Stretch Gauze Bandage, Sterile 2x75 (in/in) Discharge Instruction: Secure with stretch gauze as directed. Transpore Surgical Tape, 2x10 (in/yd) Discharge Instruction: Secure dressing with tape as directed. Compression Wrap Compression Stockings Add-Ons Wound #3 (Toe Great) Wound Laterality: Right Cleanser Soap and Water Discharge Instruction: May shower and wash wound with dial antibacterial soap and water prior to dressing change. Peri-Wound Care Topical Primary Dressing KerraCel Ag Gelling Fiber Dressing, 2x2 in (silver alginate) Discharge Instruction: Apply silver alginate to wound bed as instructed Secondary Dressing Woven Gauze Sponges 2x2 in Discharge Instruction: Apply over primary dressing as directed. Optifoam Non-Adhesive Dressing, 4x4 in Discharge Instruction: Apply as donut over dressing Secured With Conforming Stretch Gauze Bandage, Sterile 2x75 (in/in) Discharge Instruction: Secure with stretch gauze as directed. Transpore Surgical Tape, 2x10 (in/yd) Discharge Instruction: Secure dressing with tape as directed. Compression Wrap Compression Stockings Add-Ons Wound #4 (Toe Second) Wound Laterality: Right Cleanser Soap and Water Discharge Instruction: May shower and wash wound with dial antibacterial soap and water prior to dressing change. Peri-Wound Care Topical Primary Dressing KerraCel Ag Gelling Fiber Dressing, 2x2 in (silver alginate) Discharge Instruction: Apply silver alginate to wound bed as instructed Secondary Dressing Woven Gauze Sponges 2x2 in Discharge Instruction: Apply over primary dressing as directed. Optifoam Non-Adhesive Dressing, 4x4 in Discharge Instruction: Apply as donut over dressing Secured  With Conforming Stretch Gauze Bandage, Sterile 2x75 (in/in) Discharge Instruction: Secure with stretch gauze as directed. Transpore Surgical Tape, 2x10 (in/yd) Discharge Instruction: Secure dressing with tape as  directed. Compression Wrap Compression Stockings Add-Ons Electronic Signature(s) Signed: 06/13/2021 10:43:18 AM By: Kalman Shan DO Signed: 06/13/2021 3:40:56 PM By: Lorrin Jackson Entered By: Kalman Shan on 06/13/2021 10:36:36 -------------------------------------------------------------------------------- Multi-Disciplinary Care Plan Details Patient Name: Date of Service: Aneta Mins 06/13/2021 8:00 A M Medical Record Number: [id number] Patient Account Number: [id number] Date of Birth/Sex: Treating RN: July 24, 1956 (65 y.o. Marcheta Grammes Primary Care Chattie Greeson: Antony Contras Other Clinician: Referring Kaytee Taliercio: Treating Gwendlyn Hanback/Extender: Olivia Mackie in Treatment: 4 Active Inactive Nutrition Nursing Diagnoses: Impaired glucose control: actual or potential Goals: Patient/caregiver verbalizes understanding of need to maintain therapeutic glucose control per primary care physician Date Initiated: 05/16/2021 Target Resolution Date: 07/11/2021 Goal Status: Active Interventions: Provide education on elevated blood sugars and impact on wound healing Provide education on nutrition Notes: 06/13/21: Glucose control ongoing Wound/Skin Impairment Nursing Diagnoses: Impaired tissue integrity Goals: Patient/caregiver will verbalize understanding of skin care regimen Date Initiated: 05/16/2021 Date Inactivated: 06/13/2021 Target Resolution Date: 06/13/2021 Goal Status: Met Ulcer/skin breakdown will have a volume reduction of 30% by week 4 Date Initiated: 05/16/2021 Date Inactivated: 06/13/2021 Target Resolution Date: 06/13/2021 Goal Status: Met Ulcer/skin breakdown will have a volume reduction of 50% by week 8 Date Initiated: 06/13/2021 Target Resolution Date: 07/11/2021 Goal Status: Active Interventions: Assess patient/caregiver ability to obtain necessary supplies Assess patient/caregiver ability to perform ulcer/skin care regimen upon admission  and as needed Assess ulceration(s) every visit Provide education on ulcer and skin care Treatment Activities: Skin care regimen initiated : 05/16/2021 Topical wound management initiated : 05/16/2021 Notes: 06/13/21: Wounds greater than 30% volume reduction, new goal initiated. Electronic Signature(s) Signed: 06/13/2021 3:40:56 PM By: Lorrin Jackson Entered By: Lorrin Jackson on 06/13/2021 08:48:38 -------------------------------------------------------------------------------- Pain Assessment Details Patient Name: Date of Service: TREVEON, BOURCIER 06/13/2021 8:00 A M Medical Record Number: [id number] Patient Account Number: [id number] Date of Birth/Sex: Treating RN: 03-07-1956 (65 y.o. Ernestene Mention Primary Care Keiden Deskin: Antony Contras Other Clinician: Referring Rubel Heckard: Treating Chamika Cunanan/Extender: Olivia Mackie in Treatment: 4 Active Problems Location of Pain Severity and Description of Pain Patient Has Paino No Site Locations Rate the pain. Current Pain Level: 0 Pain Management and Medication Current Pain Management: Electronic Signature(s) Signed: 06/13/2021 4:44:37 PM By: Baruch Gouty RN, BSN Entered By: Baruch Gouty on 06/13/2021 08:14:38 -------------------------------------------------------------------------------- Patient/Caregiver Education Details Patient Name: Date of Service: Chervenak, WILLIA M C. 7/25/2022andnbsp8:00 [place] Record Number: [id number] Patient Account Number: [id number] Date of Birth/Gender: Treating RN: 23-Feb-1956 (65 y.o. Marcheta Grammes Primary Care Physician: Antony Contras Other Clinician: Referring Physician: Treating Physician/Extender: Olivia Mackie in Treatment: 4 Education Assessment Education Provided To: Patient Education Topics Provided Elevated Blood Sugar/ Impact on Healing: Methods: Explain/Verbal, Printed Responses: State content correctly Offloading: Methods:  Demonstration, Explain/Verbal, Printed Responses: State content correctly Wound/Skin Impairment: Methods: Demonstration, Explain/Verbal, Printed Responses: State content correctly Electronic Signature(s) Signed: 06/13/2021 3:40:56 PM By: Lorrin Jackson Entered By: Lorrin Jackson on 06/13/2021 08:43:43 -------------------------------------------------------------------------------- Wound Assessment Details Patient Name: Date of Service: Aneta Mins 06/13/2021 8:00 A M Medical Record Number: [id number] Patient Account Number: [id number] Date of Birth/Sex: Treating RN: 05/10/1956 (64 y.o. Ernestene Mention Primary Care Ghadeer Kastelic: Antony Contras Other Clinician: Referring Kayin Osment: Treating Damarian Priola/Extender: Olivia Mackie in Treatment: 4 Wound Status Wound Number: 2 Primary Diabetic Wound/Ulcer  of the Lower Extremity Etiology: Wound Location: Left T Great oe Wound Open Wounding Event: Gradually Appeared Status: Date Acquired: 03/20/2021 Comorbid Sleep Apnea, Coronary Artery Disease, Hypertension, Type II Weeks Of Treatment: 4 History: Diabetes, Osteoarthritis, Neuropathy, Confinement Anxiety Clustered Wound: No Photos Wound Measurements Length: (cm) 1.4 Width: (cm) 0.8 Depth: (cm) 0.2 Area: (cm) 0.88 Volume: (cm) 0.176 % Reduction in Area: 42.6% % Reduction in Volume: 42.5% Epithelialization: Small (1-33%) Tunneling: No Undermining: No Wound Description Classification: Grade 2 Wound Margin: Thickened Exudate Amount: Medium Exudate Type: Serosanguineous Exudate Color: red, brown Foul Odor After Cleansing: No Slough/Fibrino No Wound Bed Granulation Amount: Large (67-100%) Exposed Structure Granulation Quality: Red, Pink, Pale Fascia Exposed: No Necrotic Amount: Small (1-33%) Fat Layer (Subcutaneous Tissue) Exposed: Yes Necrotic Quality: Adherent Slough Tendon Exposed: No Muscle Exposed: No Joint Exposed: No Bone Exposed:  No Treatment Notes Wound #2 (Toe Great) Wound Laterality: Left Cleanser Soap and Water Discharge Instruction: May shower and wash wound with dial antibacterial soap and water prior to dressing change. Peri-Wound Care Topical Primary Dressing KerraCel Ag Gelling Fiber Dressing, 2x2 in (silver alginate) Discharge Instruction: Apply silver alginate to wound bed as instructed Secondary Dressing Woven Gauze Sponges 2x2 in Discharge Instruction: Apply over primary dressing as directed. Optifoam Non-Adhesive Dressing, 4x4 in Discharge Instruction: Apply as donut over dressing Secured With Conforming Stretch Gauze Bandage, Sterile 2x75 (in/in) Discharge Instruction: Secure with stretch gauze as directed. Transpore Surgical Tape, 2x10 (in/yd) Discharge Instruction: Secure dressing with tape as directed. Compression Wrap Compression Stockings Add-Ons Electronic Signature(s) Signed: 06/13/2021 4:44:37 PM By: Baruch Gouty RN, BSN Entered By: Baruch Gouty on 06/13/2021 08:25:53 -------------------------------------------------------------------------------- Wound Assessment Details Patient Name: Date of Service: Aneta Mins 06/13/2021 8:00 A M Medical Record Number: [id number] Patient Account Number: [id number] Date of Birth/Sex: Treating RN: [birth year] (65 y.o. Ernestene Mention Primary Care Neil Brickell: Antony Contras Other Clinician: Referring Canden Cieslinski: Treating Chastity Noland/Extender: Olivia Mackie in Treatment: 4 Wound Status Wound Number: 3 Primary Diabetic Wound/Ulcer of the Lower Extremity Etiology: Wound Location: Right T Great oe Wound Open Wounding Event: Gradually Appeared Status: Date Acquired: 07/21/2020 Comorbid Sleep Apnea, Coronary Artery Disease, Hypertension, Type II Weeks Of Treatment: 4 History: Diabetes, Osteoarthritis, Neuropathy, Confinement Anxiety Clustered Wound: No Photos Wound Measurements Length: (cm) 1.4 Width: (cm)  1.7 Depth: (cm) 0.1 Area: (cm) 1.869 Volume: (cm) 0.187 % Reduction in Area: -76.3% % Reduction in Volume: 55.9% Epithelialization: None Tunneling: No Undermining: No Wound Description Classification: Grade 2 Wound Margin: Thickened Exudate Amount: Medium Exudate Type: Serosanguineous Exudate Color: red, brown Foul Odor After Cleansing: No Slough/Fibrino No Wound Bed Granulation Amount: Large (67-100%) Exposed Structure Granulation Quality: Red Fascia Exposed: No Necrotic Amount: Small (1-33%) Fat Layer (Subcutaneous Tissue) Exposed: Yes Necrotic Quality: Adherent Slough Tendon Exposed: No Muscle Exposed: No Joint Exposed: No Bone Exposed: No Assessment Notes blistered skin lateral side of wound edge Treatment Notes Wound #3 (Toe Great) Wound Laterality: Right Cleanser Soap and Water Discharge Instruction: May shower and wash wound with dial antibacterial soap and water prior to dressing change. Peri-Wound Care Topical Primary Dressing KerraCel Ag Gelling Fiber Dressing, 2x2 in (silver alginate) Discharge Instruction: Apply silver alginate to wound bed as instructed Secondary Dressing Woven Gauze Sponges 2x2 in Discharge Instruction: Apply over primary dressing as directed. Optifoam Non-Adhesive Dressing, 4x4 in Discharge Instruction: Apply as donut over dressing Secured With Conforming Stretch Gauze Bandage, Sterile 2x75 (in/in) Discharge Instruction: Secure with stretch gauze as directed. Transpore Surgical Tape, 2x10 (in/yd) Discharge Instruction:  Secure dressing with tape as directed. Compression Wrap Compression Stockings Add-Ons Electronic Signature(s) Signed: 06/13/2021 4:44:37 PM By: Baruch Gouty RN, BSN Entered By: Baruch Gouty on 06/13/2021 08:27:01 -------------------------------------------------------------------------------- Wound Assessment Details Patient Name: Date of Service: Aneta Mins 06/13/2021 8:00 A M Medical Record  Number: [id number] Patient Account Number: [id number] Date of Birth/Sex: Treating RN: 1956/07/26 (65 y.o. Ernestene Mention Primary Care Chassity Ludke: Antony Contras Other Clinician: Referring Maicie Vanderloop: Treating Patton Swisher/Extender: Olivia Mackie in Treatment: 4 Wound Status Wound Number: 4 Primary Diabetic Wound/Ulcer of the Lower Extremity Etiology: Wound Location: Right T Second oe Wound Open Wounding Event: Gradually Appeared Status: Date Acquired: 05/02/2021 Comorbid Sleep Apnea, Coronary Artery Disease, Hypertension, Type II Weeks Of Treatment: 4 History: Diabetes, Osteoarthritis, Neuropathy, Confinement Anxiety Clustered Wound: No Photos Wound Measurements Length: (cm) 0.4 Width: (cm) 0.5 Depth: (cm) 0.1 Area: (cm) 0.157 Volume: (cm) 0.016 % Reduction in Area: 19.9% % Reduction in Volume: 59% Epithelialization: Small (1-33%) Tunneling: No Undermining: Yes Starting Position (o'clock): 9 Ending Position (o'clock): 12 Maximum Distance: (cm) 0.3 Wound Description Classification: Grade 2 Wound Margin: Distinct, outline attached Exudate Amount: Medium Exudate Type: Serosanguineous Exudate Color: red, brown Foul Odor After Cleansing: No Slough/Fibrino No Wound Bed Granulation Amount: Large (67-100%) Exposed Structure Granulation Quality: Red, Pink Fascia Exposed: No Necrotic Amount: None Present (0%) Fat Layer (Subcutaneous Tissue) Exposed: Yes Tendon Exposed: No Muscle Exposed: No Joint Exposed: No Bone Exposed: No Treatment Notes Wound #4 (Toe Second) Wound Laterality: Right Cleanser Soap and Water Discharge Instruction: May shower and wash wound with dial antibacterial soap and water prior to dressing change. Peri-Wound Care Topical Primary Dressing KerraCel Ag Gelling Fiber Dressing, 2x2 in (silver alginate) Discharge Instruction: Apply silver alginate to wound bed as instructed Secondary Dressing Woven Gauze Sponges 2x2  in Discharge Instruction: Apply over primary dressing as directed. Optifoam Non-Adhesive Dressing, 4x4 in Discharge Instruction: Apply as donut over dressing Secured With Conforming Stretch Gauze Bandage, Sterile 2x75 (in/in) Discharge Instruction: Secure with stretch gauze as directed. Transpore Surgical Tape, 2x10 (in/yd) Discharge Instruction: Secure dressing with tape as directed. Compression Wrap Compression Stockings Add-Ons Electronic Signature(s) Signed: 06/13/2021 4:44:37 PM By: Baruch Gouty RN, BSN Entered By: Baruch Gouty on 06/13/2021 08:27:43 -------------------------------------------------------------------------------- [place] Details Patient Name: Date of Service: Aneta Mins 06/13/2021 8:00 A M Medical Record Number: [id number] Patient Account Number: [id number] Date of Birth/Sex: Treating RN: [birth year] (65 y.o. Ernestene Mention Primary Care Charan Prieto: Antony Contras Other Clinician: Referring Joretta Eads: Treating Izzie Geers/Extender: Olivia Mackie in Treatment: 4 Vital Signs Time Taken: 08:12 Temperature (F): 97.8 Height (in): 72 Pulse (bpm): 75 Source: Stated Respiratory Rate (breaths/min): 18 Weight (lbs): 271 Blood Pressure (mmHg): 154/92 Source: Stated Capillary Blood Glucose (mg/dl): 170 Body Mass Index (BMI): 36.8 Reference Range: 80 - 120 mg / dl Notes glucose per pt report the am prior to insulin dose Electronic Signature(s) Signed: 06/13/2021 4:44:37 PM By: Baruch Gouty RN, BSN Entered By: Baruch Gouty on 06/13/2021 08:14:19

## 2021-06-20 ENCOUNTER — Encounter (HOSPITAL_BASED_OUTPATIENT_CLINIC_OR_DEPARTMENT_OTHER): Payer: 59 | Admitting: Internal Medicine

## 2021-07-04 ENCOUNTER — Encounter (HOSPITAL_BASED_OUTPATIENT_CLINIC_OR_DEPARTMENT_OTHER): Payer: 59 | Attending: Internal Medicine | Admitting: Internal Medicine

## 2021-07-13 ENCOUNTER — Other Ambulatory Visit: Payer: Self-pay

## 2021-07-13 ENCOUNTER — Other Ambulatory Visit: Payer: Self-pay | Admitting: Internal Medicine

## 2021-07-13 ENCOUNTER — Ambulatory Visit
Admission: RE | Admit: 2021-07-13 | Discharge: 2021-07-13 | Disposition: A | Discharge status: Home / Self Care | Payer: 59 | Source: Ambulatory Visit | Attending: Internal Medicine | Admitting: Internal Medicine

## 2021-07-13 DIAGNOSIS — T1490XA Injury, unspecified, initial encounter: Secondary | ICD-10-CM

## 2021-07-13 IMAGING — CR DG FOOT COMPLETE 3+V*R*
3 series · 3 of 3 positions shown · non-contrast
Comparison: Right foot radiographs [DATE]

CLINICAL DATA: Nonhealing wounds on bilateral great toes, history
of diabetes

EXAM:
LEFT FOOT - COMPLETE 3+ VIEW; RIGHT FOOT COMPLETE - 3+ VIEW

[x foot ap right]
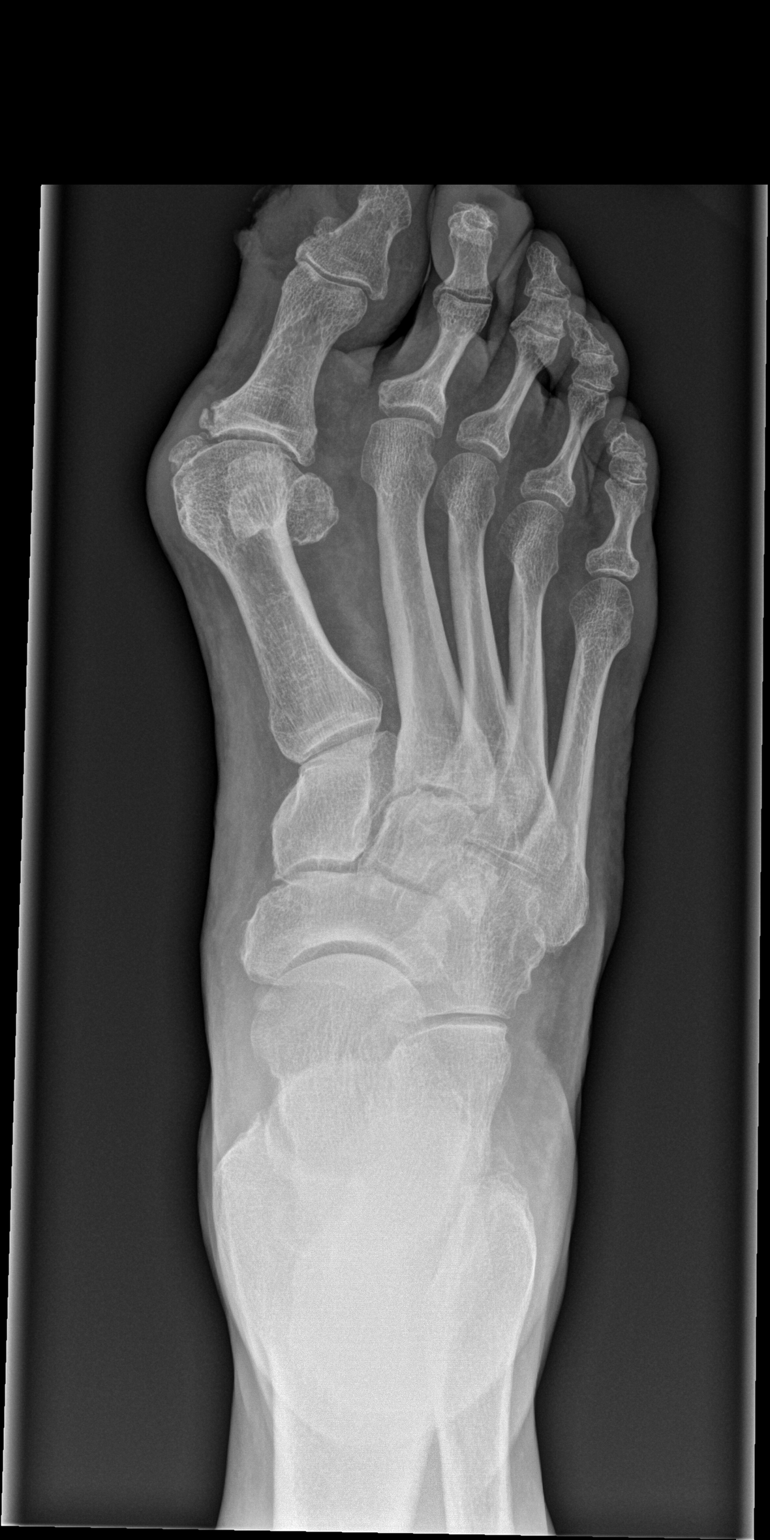

[x foot obl right]
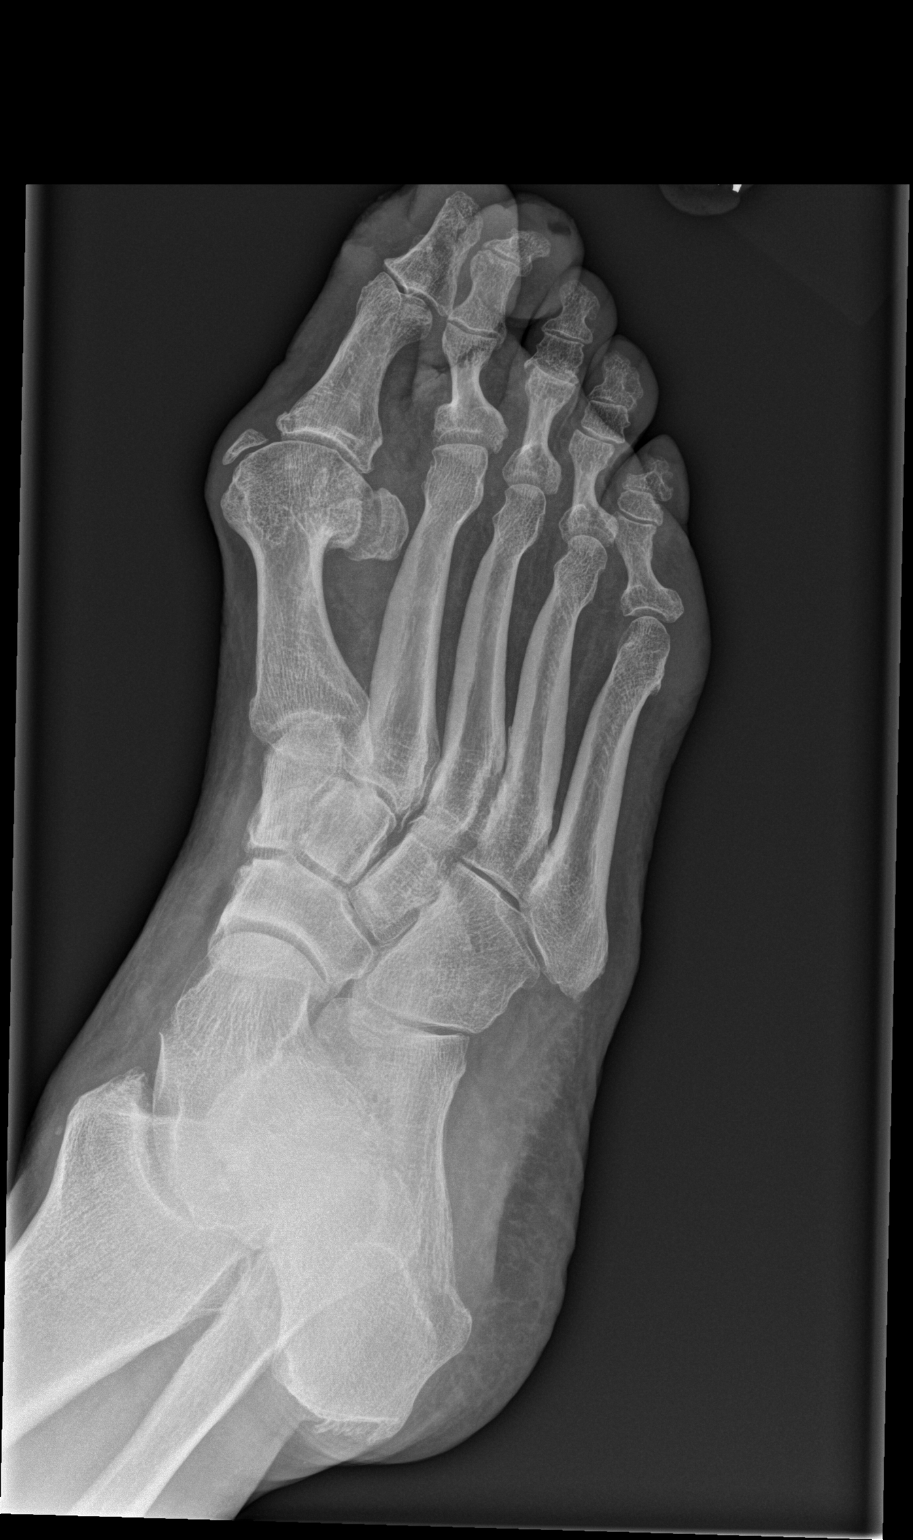

[x foot lat right]
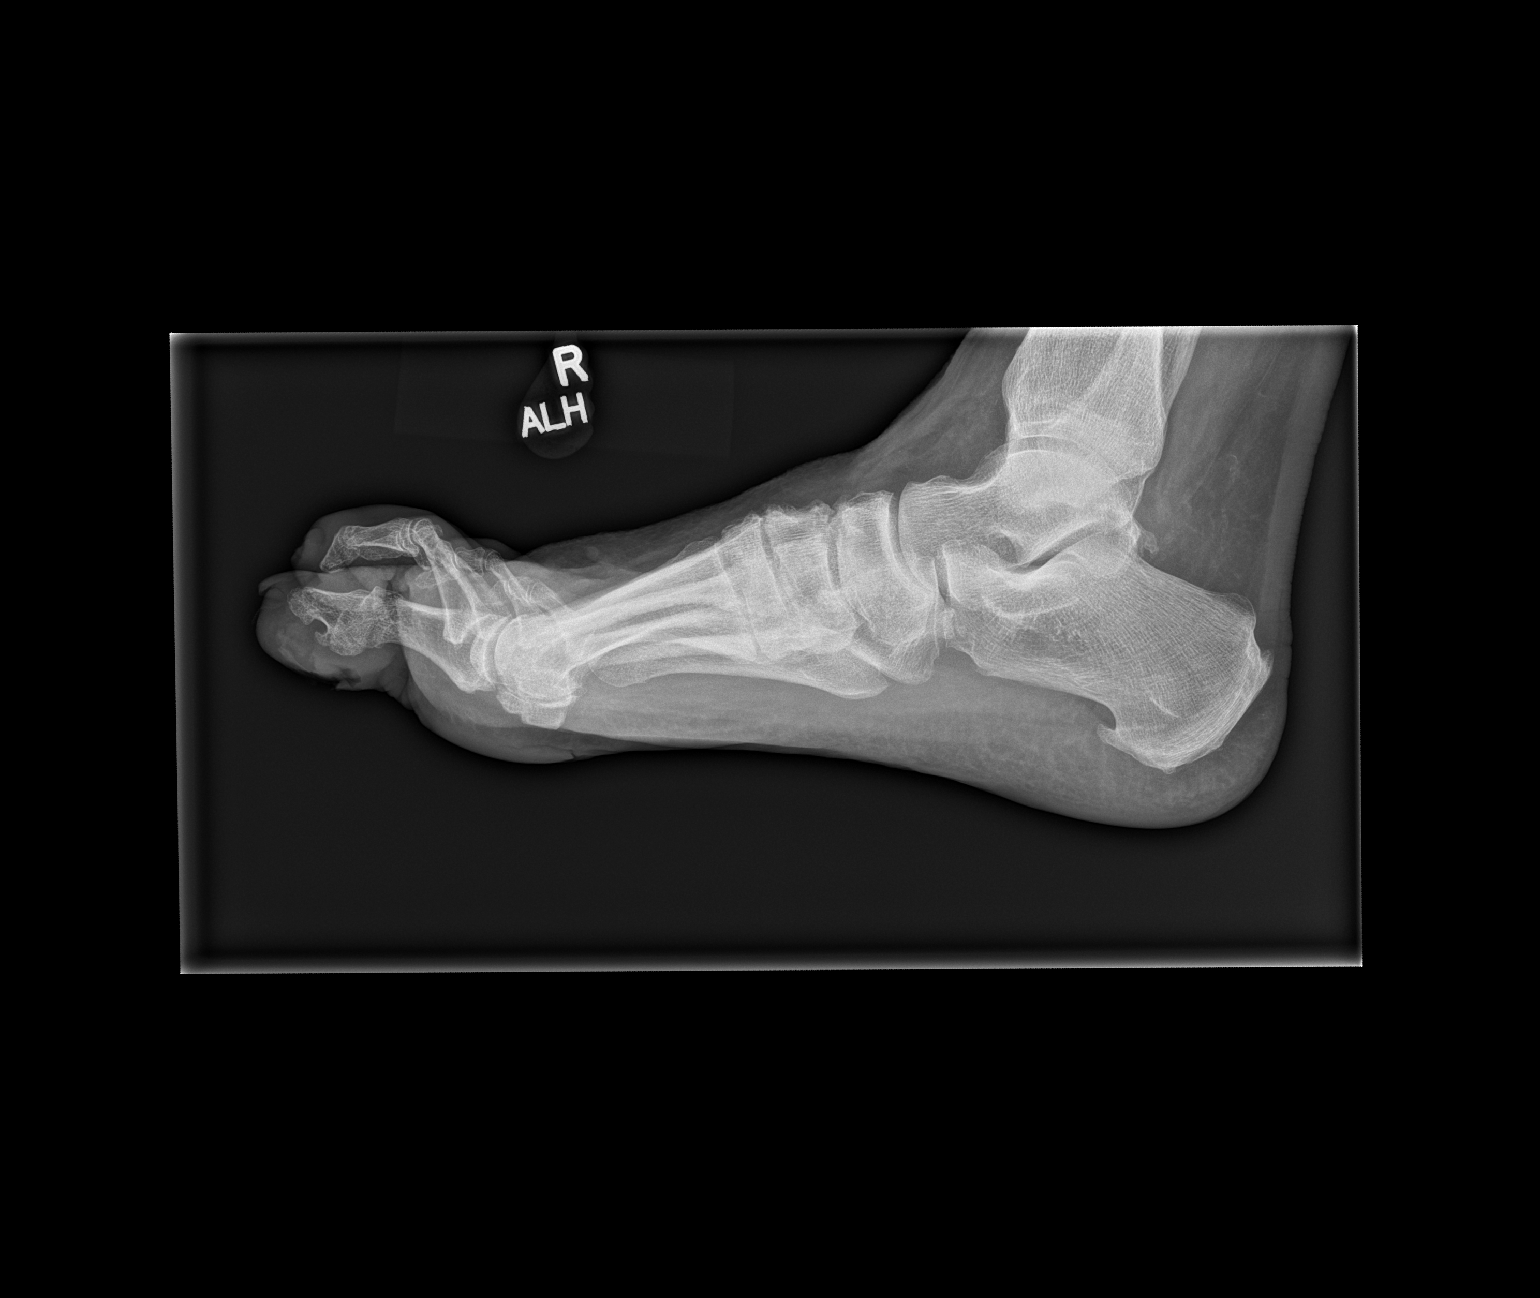

[3 of 3 positions shown; findings below may reference images not displayed]

FINDINGS: Right: There is oblique lucency through the base of the great toe
proximal phalanx not present on the prior study from [DATE].
There is no other evidence of acute fracture. There is moderate
hallux valgus deformity, similar to the prior study. Alignment is
otherwise normal. There is degenerative change about the midfoot.
There is Achilles enthesopathy and inferior calcaneal spurring.

Bandages are seen overlying the great toe. There is no definite
osseous destruction of the underlying bone.

Left: There is no acute fracture or dislocation. Alignment is
normal. There is degenerative change about the midfoot. There is
inferior calcaneal spurring and Achilles enthesopathy.

There is no evidence of osseous destruction involving the great toe.
IMPRESSION: 1. Lucency through the base of the great toe proximal phalanx
suggestive of age-indeterminate fracture, though new since
[DATE].
[DATE]. No definite evidence of osseous destruction involving either
great toe. If there is clinical concern for osteomyelitis, MRI is
recommended.

## 2021-07-13 IMAGING — CR DG FOOT COMPLETE 3+V*L*
3 series · 3 of 3 positions shown · non-contrast
Comparison: Right foot radiographs [DATE]

CLINICAL DATA: Nonhealing wounds on bilateral great toes, history
of diabetes

EXAM:
LEFT FOOT - COMPLETE 3+ VIEW; RIGHT FOOT COMPLETE - 3+ VIEW

[x foot ap left]
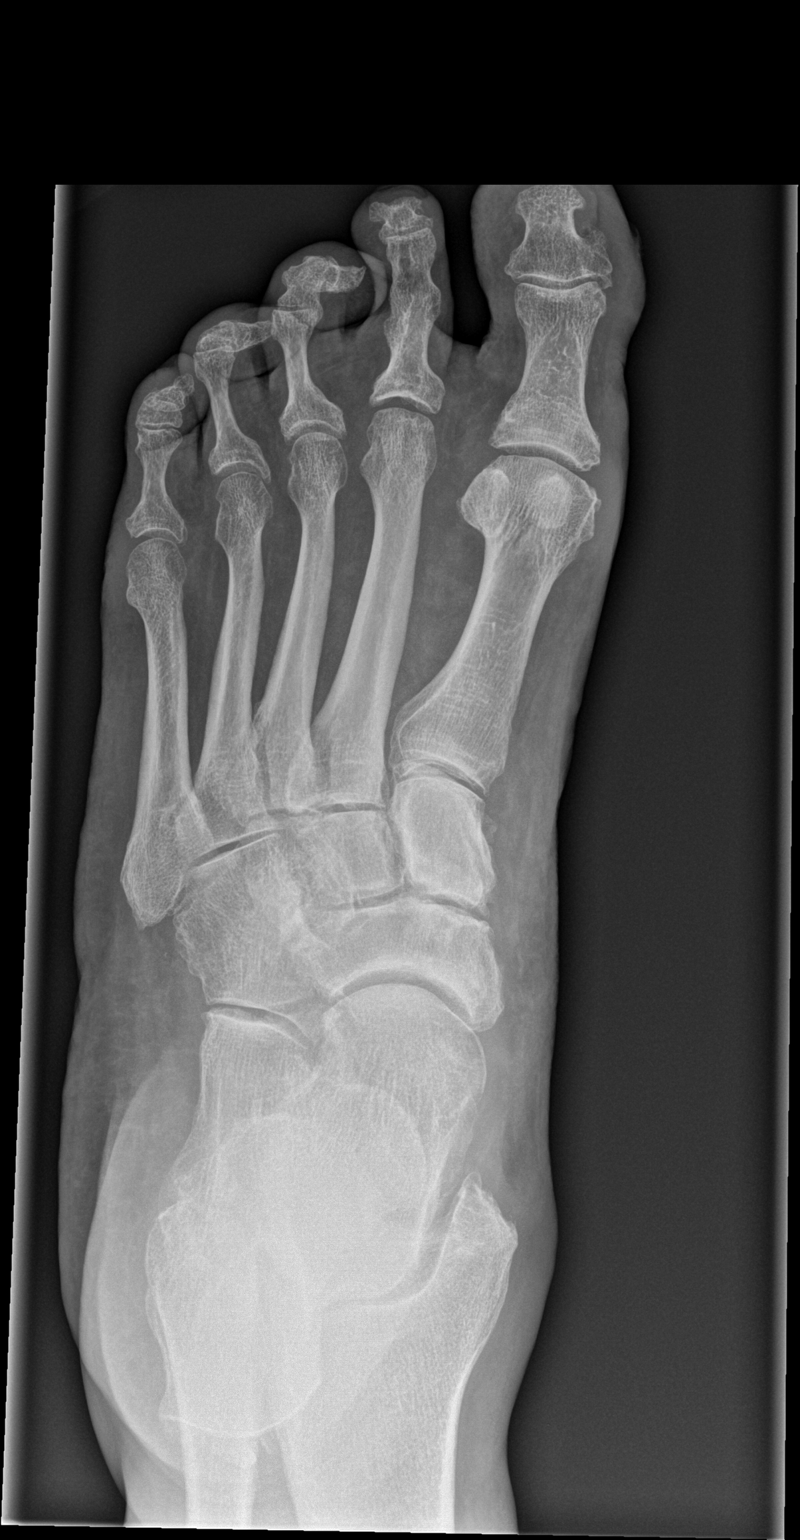

[x foot obl left]
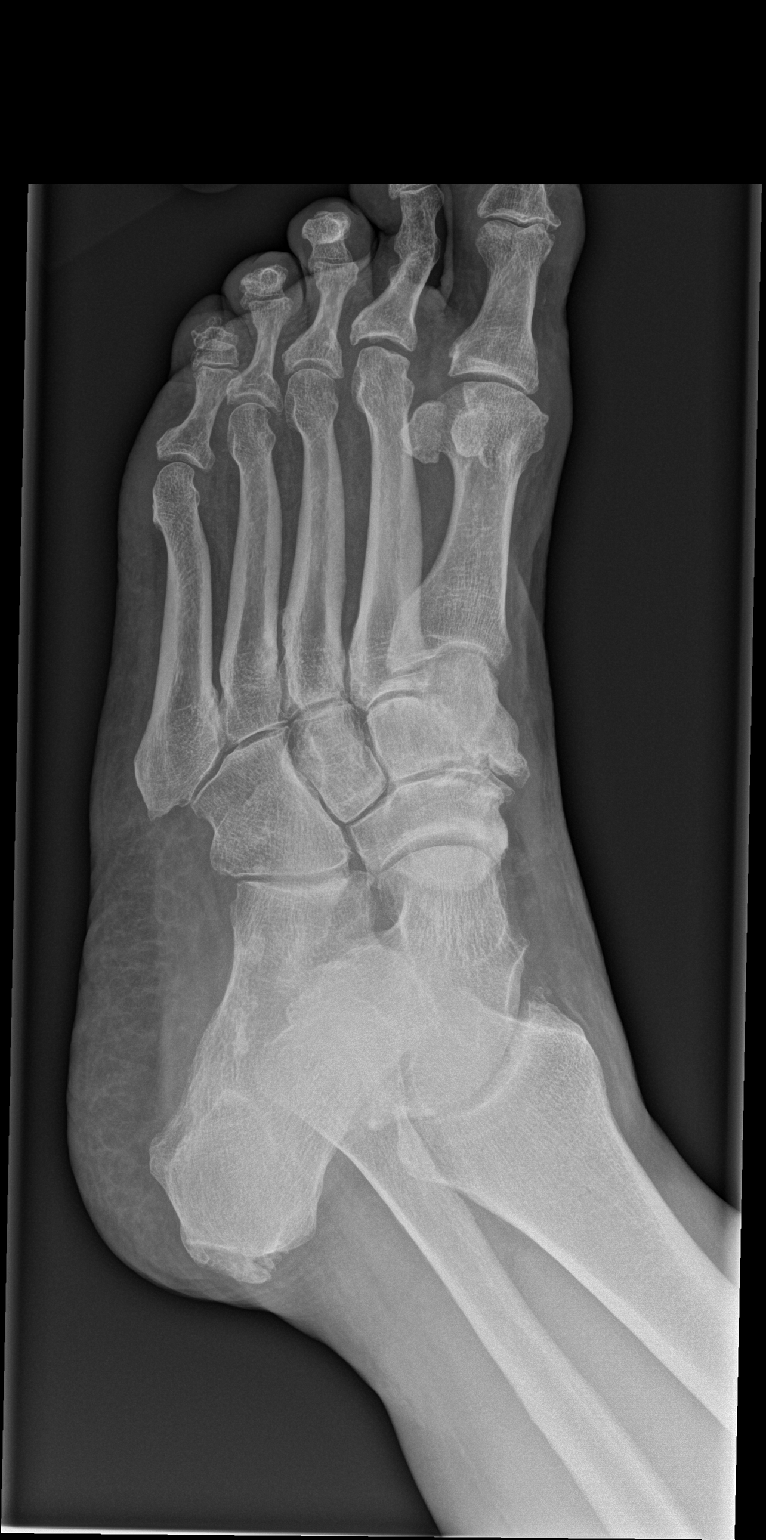

[x foot lat left]
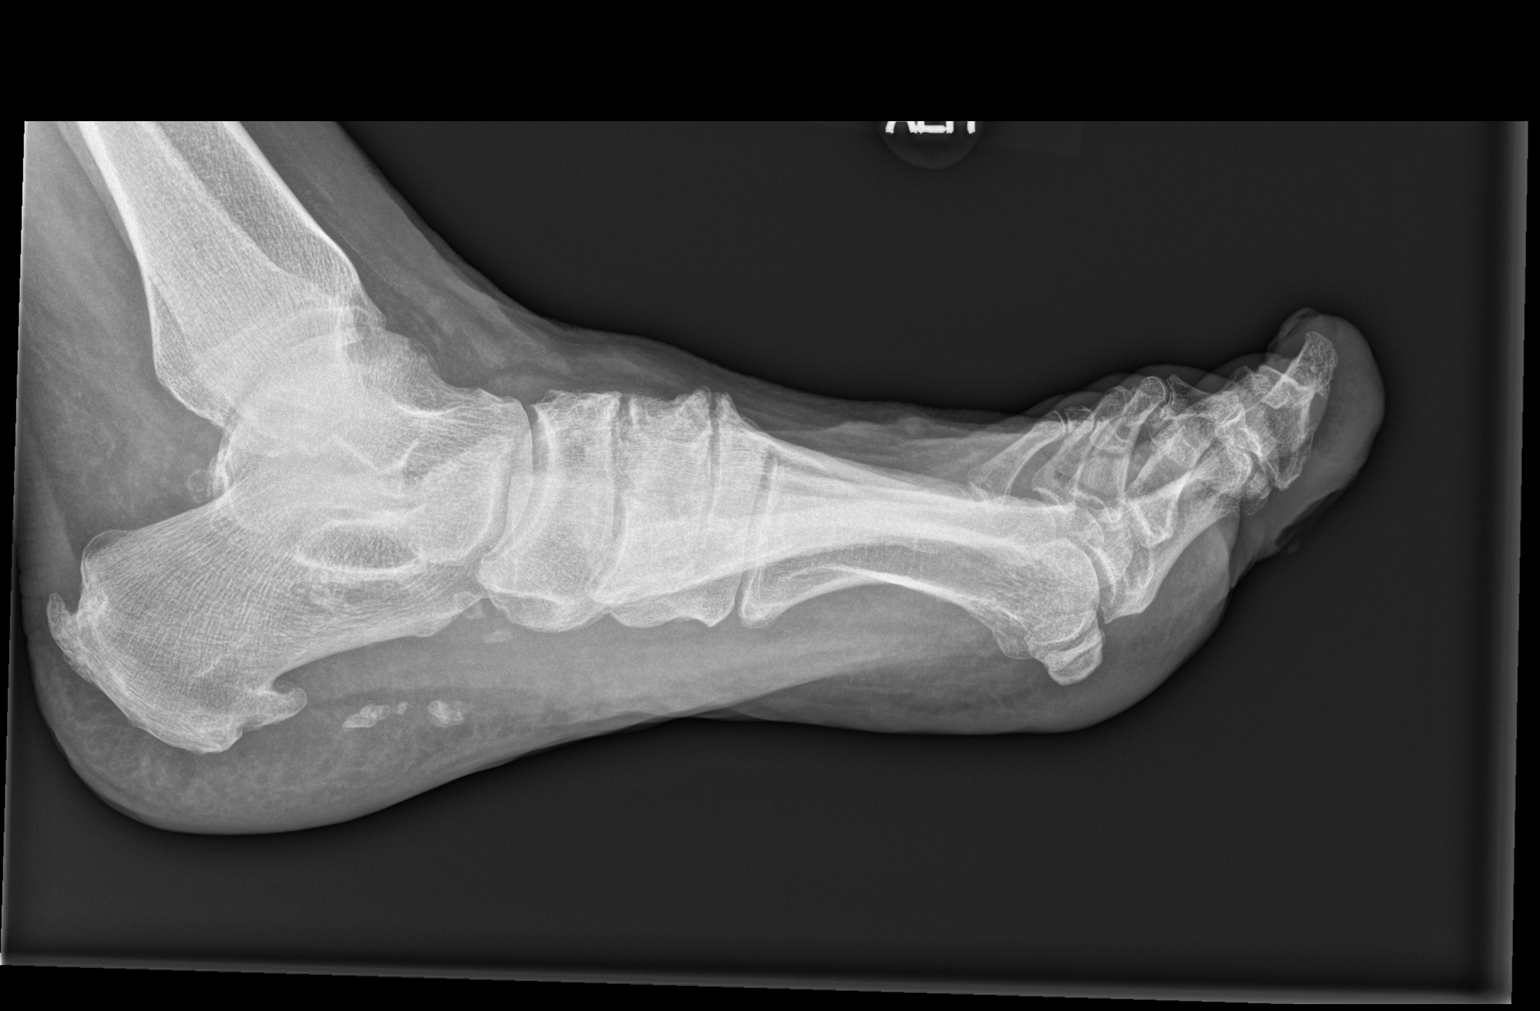

[3 of 3 positions shown; findings below may reference images not displayed]

FINDINGS: Right: There is oblique lucency through the base of the great toe
proximal phalanx not present on the prior study from [DATE].
There is no other evidence of acute fracture. There is moderate
hallux valgus deformity, similar to the prior study. Alignment is
otherwise normal. There is degenerative change about the midfoot.
There is Achilles enthesopathy and inferior calcaneal spurring.

Bandages are seen overlying the great toe. There is no definite
osseous destruction of the underlying bone.

Left: There is no acute fracture or dislocation. Alignment is
normal. There is degenerative change about the midfoot. There is
inferior calcaneal spurring and Achilles enthesopathy.

There is no evidence of osseous destruction involving the great toe.
IMPRESSION: 1. Lucency through the base of the great toe proximal phalanx
suggestive of age-indeterminate fracture, though new since
[DATE].
[DATE]. No definite evidence of osseous destruction involving either
great toe. If there is clinical concern for osteomyelitis, MRI is
recommended.

## 2021-07-18 ENCOUNTER — Other Ambulatory Visit: Payer: Self-pay

## 2021-07-18 ENCOUNTER — Encounter (HOSPITAL_BASED_OUTPATIENT_CLINIC_OR_DEPARTMENT_OTHER): Payer: 59 | Attending: Internal Medicine | Admitting: Internal Medicine

## 2021-07-18 DIAGNOSIS — Z951 Presence of aortocoronary bypass graft: Secondary | ICD-10-CM | POA: Diagnosis not present

## 2021-07-18 DIAGNOSIS — L97529 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: Secondary | ICD-10-CM

## 2021-07-18 DIAGNOSIS — E11621 Type 2 diabetes mellitus with foot ulcer: Secondary | ICD-10-CM | POA: Diagnosis not present

## 2021-07-18 DIAGNOSIS — Z794 Long term (current) use of insulin: Secondary | ICD-10-CM | POA: Insufficient documentation

## 2021-07-18 DIAGNOSIS — L97519 Non-pressure chronic ulcer of other part of right foot with unspecified severity: Secondary | ICD-10-CM

## 2021-07-18 DIAGNOSIS — E1140 Type 2 diabetes mellitus with diabetic neuropathy, unspecified: Secondary | ICD-10-CM | POA: Diagnosis not present

## 2021-07-18 DIAGNOSIS — I10 Essential (primary) hypertension: Secondary | ICD-10-CM | POA: Diagnosis not present

## 2021-07-18 NOTE — Progress Notes (Signed)
[name] [name], [name] [name] ([id number]) Visit Report for 07/18/2021 Arrival Information Details Patient Name: Date of Service: [name] [name], [name] [name] 07/18/2021 8:30 A M Medical Record Number: [id number] Patient Account Number: [id number] Date of Birth/Sex: Treating RN: 1955/12/06 (65 y.o. Lorette Ang, Meta.Reding Primary Care Graceson Nichelson: Antony Contras Other Clinician: Referring Rylann Munford: Treating Trayvond Viets/Extender: Olivia Mackie in Treatment: 9 Visit Information History Since Last Visit Added or deleted any medications: No Patient Arrived: Ambulatory Any new allergies or adverse reactions: No Arrival Time: 08:28 Had a fall or experienced change in No Accompanied By: self activities of daily living that may affect Transfer Assistance: None risk of falls: Patient Identification Verified: Yes Signs or symptoms of abuse/neglect since last visito No Secondary Verification Process Completed: Yes Hospitalized since last visit: No Patient Requires Transmission-Based Precautions: No Implantable device outside of the clinic excluding No Patient Has Alerts: No cellular tissue based products placed in the center since last visit: Has Dressing in Place as Prescribed: Yes Pain Present Now: No Electronic Signature(s) Signed: 07/18/2021 5:13:20 PM By: Deon Pilling Entered By: Deon Pilling on 07/18/2021 08:35:29 -------------------------------------------------------------------------------- Encounter Discharge Information Details Patient Name: Date of Service: [name] Mins 07/18/2021 8:30 A M Medical Record Number: [id number] Patient Account Number: [id number] Date of Birth/Sex: Treating RN: [birth year] (65 y.o. Ernestene Mention Primary Care Adedamola Seto: Antony Contras Other Clinician: Referring Novalyn Lajara: Treating Janitza Revuelta/Extender: Olivia Mackie in Treatment: 9 Encounter Discharge Information Items Post Procedure Vitals Discharge Condition: Stable Temperature (F):  97.9 Ambulatory Status: Ambulatory Pulse (bpm): 76 Discharge Destination: Home Respiratory Rate (breaths/min): 18 Transportation: Private Auto Blood Pressure (mmHg): 131/80 Accompanied By: self Schedule Follow-up Appointment: Yes Clinical Summary of Care: Patient Declined Electronic Signature(s) Signed: 07/18/2021 5:42:12 PM By: Baruch Gouty RN, BSN Entered By: Baruch Gouty on 07/18/2021 09:44:28 -------------------------------------------------------------------------------- Lower Extremity Assessment Details Patient Name: Date of Service: [name] Mins. 07/18/2021 8:30 A M Medical Record Number: [id number] Patient Account Number: [id number] Date of Birth/Sex: Treating RN: 1956-11-17 (65 y.o. Hessie Diener Primary Care Remedios Mckone: Antony Contras Other Clinician: Referring Everette Dimauro: Treating Demerius Podolak/Extender: Olivia Mackie in Treatment: 9 Edema Assessment Assessed: Shirlyn Goltz: Yes] Patrice Paradise: Yes] Edema: [Left: No] [Right: No] Calf Left: Right: Point of Measurement: 34 cm From Medial Instep 41 cm 42.5 cm Ankle Left: Right: Point of Measurement: 10 cm From Medial Instep 23 cm 23 cm Vascular Assessment Pulses: Dorsalis Pedis Palpable: [Left:Yes] [Right:Yes] Electronic Signature(s) Signed: 07/18/2021 5:13:20 PM By: Deon Pilling Entered By: Deon Pilling on 07/18/2021 08:36:22 -------------------------------------------------------------------------------- Multi Wound Chart Details Patient Name: Date of Service: [name] Mins 07/18/2021 8:30 A M Medical Record Number: [id number] Patient Account Number: [id number] Date of Birth/Sex: Treating RN: 10/17/1956 (65 y.o. Marcheta Grammes Primary Care Kensington Rios: Antony Contras Other Clinician: Referring Maxime Beckner: Treating Veva Grimley/Extender: Olivia Mackie in Treatment: 9 Vital Signs Height(in): 72 Capillary Blood Glucose(mg/dl): 123 Weight(lbs): 271 Pulse(bpm): 84 Body  Mass Index(BMI): 15 Blood Pressure(mmHg): 131/80 Temperature(F): 97.9 Respiratory Rate(breaths/min): 82 Photos: [2:Left T Great oe] [3:Right T Great oe] [4:Right T Second] Wound Location: [2:Gradually Appeared] [3:Gradually Appeared] [4:Gradually Appeared] Wounding Event: [2:Diabetic Wound/Ulcer of the Lower] [3:Diabetic Wound/Ulcer of the Lower] [4:Diabetic Wound/Ulcer of the Lower] Primary Etiology: [2:Extremity Sleep Apnea, Coronary Artery] [3:Extremity Sleep Apnea, Coronary Artery] [4:Extremity Sleep Apnea, Coronary Artery] Comorbid History: [2:Disease, Hypertension, Type II Diabetes, Osteoarthritis, Neuropathy, Confinement Anxiety 03/20/2021] [3:Disease, Hypertension, Type II Diabetes, Osteoarthritis, Neuropathy, Confinement Anxiety 07/21/2020] [4:Disease, Hypertension, Type  II Diabetes, Osteoarthritis, Neuropathy, Confinement Anxiety 05/02/2021]  Date Acquired: [2:9] [3:9] [4:9] Weeks of Treatment: [2:Open] [3:Open] [4:Open] Wound Status: [2:1.4x1.5x0.1] [3:1x1.7x0.5] [4:0.3x0.5x0.3] Measurements L x W x D (cm) [2:1.649] [3:1.335] [4:0.118] A (cm) : rea [2:0.165] [3:0.668] [4:0.035] Volume (cm) : [2:-7.60%] [3:-25.90%] [4:39.80%] % Reduction in A rea: [2:46.10%] [3:-57.50%] [4:10.30%] % Reduction in Volume: [3:12] Position 1 (o'clock): [3:0.4] Maximum Distance 1 (cm): [4:12] Starting Position 1 (o'clock): [4:12] Ending Position 1 (o'clock): [4:0.3] Maximum Distance 1 (cm): [2:No] [3:Yes] [4:No] Tunneling: [2:No] [3:No] [4:Yes] Undermining: [2:Grade 2] [3:Grade 2] [4:Grade 2] Classification: [2:Medium] [3:Medium] [4:Medium] Exudate A mount: [2:Serosanguineous] [3:Serosanguineous] [4:Serosanguineous] Exudate Type: [2:red, brown] [3:red, brown] [4:red, brown] Exudate Color: [2:Thickened] [3:Thickened] [4:Distinct, outline attached] Wound Margin: [2:Large (67-100%)] [3:Medium (34-66%)] [4:Large (67-100%)] Granulation A mount: [2:Red, Pink, Pale] [3:Red] [4:Pink, Pale] Granulation  Quality: [2:Small (1-33%)] [3:Medium (34-66%)] [4:None Present (0%)] Necrotic A mount: [2:Fat Layer (Subcutaneous Tissue): Yes Fat Layer (Subcutaneous Tissue): Yes Fat Layer (Subcutaneous Tissue): Yes] Exposed Structures: [2:Fascia: No Tendon: No Muscle: No Joint: No Bone: No Small (1-33%)] [3:Fascia: No Tendon: No Muscle: No Joint: No Bone: No None] [4:Fascia: No Tendon: No Muscle: No Joint: No Bone: No Small (1-33%)] Epithelialization: [2:Debridement - Excisional] [3:Debridement - Excisional] [4:Debridement - Excisional] Debridement: Pre-procedure Verification/Time Out 09:04 [3:09:04] [4:09:04] Taken: [2:Lidocaine 4% Topical Solution] [3:Lidocaine 4% Topical Solution] [4:Lidocaine 4% Topical Solution] Pain Control: [2:Callus, Subcutaneous] [3:Callus, Subcutaneous] [4:Callus, Subcutaneous] Tissue Debrided: [2:Skin/Subcutaneous Tissue] [3:Skin/Subcutaneous Tissue] [4:Skin/Subcutaneous Tissue] Level: [2:2.1] [3:1.7] [4:0.15] Debridement A (sq cm): [2:rea Curette] [3:Curette] [4:Curette] Instrument: [2:Minimum] [3:Minimum] [4:Minimum] Bleeding: [2:Pressure] [3:Pressure] [4:Pressure] Hemostasis A chieved: [2:Procedure was tolerated well] [3:Procedure was tolerated well] [4:Procedure was tolerated well] Debridement Treatment Response: [2:1.4x1.5x0.1] [3:1x1.7x0.5] [4:0.3x0.5x0.3] Post Debridement Measurements L x W x D (cm) [2:0.165] [3:0.668] [4:0.035] Post Debridement Volume: (cm) [2:callous to periwound.] [3:callous to periwound.] [4:callous to periwound.] Assessment Notes: [2:Debridement] [3:Debridement] [4:Debridement] Treatment Notes Wound #2 (Toe Great) Wound Laterality: Left Cleanser Soap and Water Discharge Instruction: May shower and wash wound with dial antibacterial soap and water prior to dressing change. Peri-Wound Care Topical Primary Dressing KerraCel Ag Gelling Fiber Dressing, 2x2 in (silver alginate) Discharge Instruction: Apply silver alginate to wound bed as  instructed Secondary Dressing Woven Gauze Sponges 2x2 in Discharge Instruction: Apply over primary dressing as directed. Optifoam Non-Adhesive Dressing, 4x4 in Discharge Instruction: Apply as donut over dressing Secured With Conforming Stretch Gauze Bandage, Sterile 2x75 (in/in) Discharge Instruction: Secure with stretch gauze as directed. Transpore Surgical Tape, 2x10 (in/yd) Discharge Instruction: Secure dressing with tape as directed. Compression Wrap Compression Stockings Add-Ons Wound #3 (Toe Great) Wound Laterality: Right Cleanser Soap and Water Discharge Instruction: May shower and wash wound with dial antibacterial soap and water prior to dressing change. Peri-Wound Care Topical Primary Dressing KerraCel Ag Gelling Fiber Dressing, 2x2 in (silver alginate) Discharge Instruction: Apply silver alginate to wound bed as instructed Secondary Dressing Woven Gauze Sponges 2x2 in Discharge Instruction: Apply over primary dressing as directed. Optifoam Non-Adhesive Dressing, 4x4 in Discharge Instruction: Apply as donut over dressing Secured With Conforming Stretch Gauze Bandage, Sterile 2x75 (in/in) Discharge Instruction: Secure with stretch gauze as directed. Transpore Surgical Tape, 2x10 (in/yd) Discharge Instruction: Secure dressing with tape as directed. Compression Wrap Compression Stockings Add-Ons Wound #4 (Toe Second) Wound Laterality: Right Cleanser Soap and Water Discharge Instruction: May shower and wash wound with dial antibacterial soap and water prior to dressing change. Peri-Wound Care Topical Primary Dressing KerraCel Ag Gelling Fiber Dressing, 2x2 in (silver alginate) Discharge Instruction: Apply silver alginate to wound bed as instructed Secondary Dressing Woven Gauze  Sponges 2x2 in Discharge Instruction: Apply over primary dressing as directed. Optifoam Non-Adhesive Dressing, 4x4 in Discharge Instruction: Apply as donut over dressing Secured  With Conforming Stretch Gauze Bandage, Sterile 2x75 (in/in) Discharge Instruction: Secure with stretch gauze as directed. Transpore Surgical Tape, 2x10 (in/yd) Discharge Instruction: Secure dressing with tape as directed. Compression Wrap Compression Stockings Add-Ons Electronic Signature(s) Signed: 07/18/2021 10:14:07 AM By: Kalman Shan DO Signed: 07/18/2021 5:25:35 PM By: Lorrin Jackson Entered By: Kalman Shan on 07/18/2021 10:03:03 -------------------------------------------------------------------------------- Multi-Disciplinary Care Plan Details Patient Name: Date of Service: [name] [name], [name] [name] 07/18/2021 8:30 A M Medical Record Number: [id number] Patient Account Number: [id number] Date of Birth/Sex: Treating RN: 05/04/1956 (65 y.o. Marcheta Grammes Primary Care Veralyn Lopp: Antony Contras Other Clinician: Referring Kerina Simoneau: Treating Tirzah Fross/Extender: Olivia Mackie in Treatment: 9 Active Inactive Nutrition Nursing Diagnoses: Impaired glucose control: actual or potential Goals: Patient/caregiver verbalizes understanding of need to maintain therapeutic glucose control per primary care physician Date Initiated: 05/16/2021 Target Resolution Date: 08/15/2021 Goal Status: Active Interventions: Provide education on elevated blood sugars and impact on wound healing Provide education on nutrition Notes: 06/13/21: Glucose control ongoing 07/18/21: Glucose control remains ongoing. Wound/Skin Impairment Nursing Diagnoses: Impaired tissue integrity Goals: Patient/caregiver will verbalize understanding of skin care regimen Date Initiated: 05/16/2021 Date Inactivated: 06/13/2021 Target Resolution Date: 06/13/2021 Goal Status: Met Ulcer/skin breakdown will have a volume reduction of 30% by week 4 Date Initiated: 05/16/2021 Date Inactivated: 06/13/2021 Target Resolution Date: 06/13/2021 Goal Status: Met Ulcer/skin breakdown will have a volume reduction of  50% by week 8 Date Initiated: 06/13/2021 Target Resolution Date: 08/08/2021 Goal Status: Active Interventions: Assess patient/caregiver ability to obtain necessary supplies Assess patient/caregiver ability to perform ulcer/skin care regimen upon admission and as needed Assess ulceration(s) every visit Provide education on ulcer and skin care Treatment Activities: Skin care regimen initiated : 05/16/2021 Topical wound management initiated : 05/16/2021 Notes: 06/13/21: Wounds greater than 30% volume reduction, new goal initiated. 07/18/21: wounds not yet greater than 50% volume reduction, patient not be coming to appointments. Electronic Signature(s) Signed: 07/18/2021 8:43:35 AM By: Lorrin Jackson Previous Signature: 07/18/2021 8:41:00 AM Version By: Lorrin Jackson Entered By: Lorrin Jackson on 07/18/2021 08:43:34 -------------------------------------------------------------------------------- Pain Assessment Details Patient Name: Date of Service: [name] [name], [name] [name] 07/18/2021 8:30 A M Medical Record Number: [id number] Patient Account Number: [id number] Date of Birth/Sex: Treating RN: 1956-09-21 (65 y.o. Hessie Diener Primary Care Larrell Rapozo: Antony Contras Other Clinician: Referring Makaela Cando: Treating Jacie Tristan/Extender: Olivia Mackie in Treatment: 9 Active Problems Location of Pain Severity and Description of Pain Patient Has Paino No Site Locations Rate the pain. Current Pain Level: 0 Pain Management and Medication Current Pain Management: Medication: No Cold Application: No Rest: No Massage: No Activity: No T.E.N.S.: No Heat Application: No Leg drop or elevation: No Is the Current Pain Management Adequate: Adequate How does your wound impact your activities of daily livingo Sleep: No Bathing: No Appetite: No Relationship With Others: No Bladder Continence: No Emotions: No Bowel Continence: No Work: No Toileting: No Drive: No Dressing:  No Hobbies: No Electronic Signature(s) Signed: 07/18/2021 5:13:20 PM By: Deon Pilling Entered By: Deon Pilling on 07/18/2021 08:35:57 -------------------------------------------------------------------------------- Patient/Caregiver Education Details Patient Name: Date of Service: [name] Mins 8/29/2022andnbsp8:30 A M Medical Record Number: [id number] Patient Account Number: [id number] Date of Birth/Gender: Treating RN: [birth year] (65 y.o. Marcheta Grammes Primary Care Physician: Antony Contras Other Clinician: Referring Physician: Treating Physician/Extender: Olivia Mackie in Treatment: 9 Education Assessment Education  Provided To: Patient Education Topics Provided Elevated Blood Sugar/ Impact on Healing: Methods: Explain/Verbal Responses: State content correctly Wound/Skin Impairment: Methods: Explain/Verbal, Printed Responses: State content correctly Electronic Signature(s) Signed: 07/18/2021 5:25:35 PM By: Lorrin Jackson Entered By: Lorrin Jackson on 07/18/2021 08:41:16 -------------------------------------------------------------------------------- Wound Assessment Details Patient Name: Date of Service: [name] Mins 07/18/2021 8:30 A M Medical Record Number: [id number] Patient Account Number: [id number] Date of Birth/Sex: Treating RN: [birth year] (64 y.o. Lorette Ang, Meta.Reding Primary Care Khrystina Bonnes: Antony Contras Other Clinician: Referring Angela Platner: Treating Elyjah Hazan/Extender: Olivia Mackie in Treatment: 9 Wound Status Wound Number: 2 Primary Diabetic Wound/Ulcer of the Lower Extremity Etiology: Wound Location: Left T Great oe Wound Open Wounding Event: Gradually Appeared Status: Date Acquired: 03/20/2021 Comorbid Sleep Apnea, Coronary Artery Disease, Hypertension, Type II Weeks Of Treatment: 9 History: Diabetes, Osteoarthritis, Neuropathy, Confinement Anxiety Clustered Wound: No Photos Wound  Measurements Length: (cm) 1.4 Width: (cm) 1.5 Depth: (cm) 0.1 Area: (cm) 1.649 Volume: (cm) 0.165 % Reduction in Area: -7.6% % Reduction in Volume: 46.1% Epithelialization: Small (1-33%) Tunneling: No Undermining: No Wound Description Classification: Grade 2 Wound Margin: Thickened Exudate Amount: Medium Exudate Type: Serosanguineous Exudate Color: red, brown Foul Odor After Cleansing: No Slough/Fibrino No Wound Bed Granulation Amount: Large (67-100%) Exposed Structure Granulation Quality: Red, Pink, Pale Fascia Exposed: No Necrotic Amount: Small (1-33%) Fat Layer (Subcutaneous Tissue) Exposed: Yes Necrotic Quality: Adherent Slough Tendon Exposed: No Muscle Exposed: No Joint Exposed: No Bone Exposed: No Assessment Notes callous to periwound. Treatment Notes Wound #2 (Toe Great) Wound Laterality: Left Cleanser Soap and Water Discharge Instruction: May shower and wash wound with dial antibacterial soap and water prior to dressing change. Peri-Wound Care Topical Primary Dressing KerraCel Ag Gelling Fiber Dressing, 2x2 in (silver alginate) Discharge Instruction: Apply silver alginate to wound bed as instructed Secondary Dressing Woven Gauze Sponges 2x2 in Discharge Instruction: Apply over primary dressing as directed. Optifoam Non-Adhesive Dressing, 4x4 in Discharge Instruction: Apply as donut over dressing Secured With Conforming Stretch Gauze Bandage, Sterile 2x75 (in/in) Discharge Instruction: Secure with stretch gauze as directed. Transpore Surgical Tape, 2x10 (in/yd) Discharge Instruction: Secure dressing with tape as directed. Compression Wrap Compression Stockings Add-Ons Electronic Signature(s) Signed: 07/18/2021 5:13:20 PM By: Deon Pilling Entered By: Deon Pilling on 07/18/2021 08:41:05 -------------------------------------------------------------------------------- Wound Assessment Details Patient Name: Date of Service: [name] [name], [name] [name] 07/18/2021  8:30 A M Medical Record Number: [id number] Patient Account Number: [id number] Date of Birth/Sex: Treating RN: [birth year] (65 y.o. Lorette Ang, Meta.Reding Primary Care Rosalina Dingwall: Antony Contras Other Clinician: Referring Katherleen Folkes: Treating Jule Whitsel/Extender: Olivia Mackie in Treatment: 9 Wound Status Wound Number: 3 Primary Diabetic Wound/Ulcer of the Lower Extremity Etiology: Wound Location: Right T Great oe Wound Open Wounding Event: Gradually Appeared Status: Date Acquired: 07/21/2020 Comorbid Sleep Apnea, Coronary Artery Disease, Hypertension, Type II Weeks Of Treatment: 9 Weeks Of Treatment: 9 History: Diabetes, Osteoarthritis, Neuropathy, Confinement Anxiety Clustered Wound: No Photos Wound Measurements Length: (cm) 1 Width: (cm) 1.7 Depth: (cm) 0.5 Area: (cm) 1.335 Volume: (cm) 0.668 % Reduction in Area: -25.9% % Reduction in Volume: -57.5% Epithelialization: None Tunneling: Yes Position (o'clock): 12 Maximum Distance: (cm) 0.4 Undermining: No Wound Description Classification: Grade 2 Wound Margin: Thickened Exudate Amount: Medium Exudate Type: Serosanguineous Exudate Color: red, brown Foul Odor After Cleansing: No Slough/Fibrino No Wound Bed Granulation Amount: Medium (34-66%) Exposed Structure Granulation Quality: Red Fascia Exposed: No Necrotic Amount: Medium (34-66%) Fat Layer (Subcutaneous Tissue) Exposed: Yes Necrotic Quality: Adherent Slough Tendon Exposed: No Muscle Exposed: No  Joint Exposed: No Bone Exposed: No Assessment Notes callous to periwound. Treatment Notes Wound #3 (Toe Great) Wound Laterality: Right Cleanser Soap and Water Discharge Instruction: May shower and wash wound with dial antibacterial soap and water prior to dressing change. Peri-Wound Care Topical Primary Dressing KerraCel Ag Gelling Fiber Dressing, 2x2 in (silver alginate) Discharge Instruction: Apply silver alginate to wound bed as  instructed Secondary Dressing Woven Gauze Sponges 2x2 in Discharge Instruction: Apply over primary dressing as directed. Optifoam Non-Adhesive Dressing, 4x4 in Discharge Instruction: Apply as donut over dressing Secured With Conforming Stretch Gauze Bandage, Sterile 2x75 (in/in) Discharge Instruction: Secure with stretch gauze as directed. Transpore Surgical Tape, 2x10 (in/yd) Discharge Instruction: Secure dressing with tape as directed. Compression Wrap Compression Stockings Add-Ons Electronic Signature(s) Signed: 07/18/2021 5:13:20 PM By: Deon Pilling Entered By: Deon Pilling on 07/18/2021 08:41:41 -------------------------------------------------------------------------------- Wound Assessment Details Patient Name: Date of Service: [name] [name], [name] [name] 07/18/2021 8:30 A M Medical Record Number: [id number] Patient Account Number: [id number] Date of Birth/Sex: Treating RN: 1956-11-11 (65 y.o. Lorette Ang, Meta.Reding Primary Care Edwen Mclester: Antony Contras Other Clinician: Referring Tyyne Cliett: Treating Taishaun Levels/Extender: Olivia Mackie in Treatment: 9 Wound Status Wound Number: 4 Primary Diabetic Wound/Ulcer of the Lower Extremity Etiology: Wound Location: Right T Second oe Wound Open Wounding Event: Gradually Appeared Status: Date Acquired: 05/02/2021 Comorbid Sleep Apnea, Coronary Artery Disease, Hypertension, Type II Weeks Of Treatment: 9 History: Diabetes, Osteoarthritis, Neuropathy, Confinement Anxiety Clustered Wound: No Photos Wound Measurements Length: (cm) 0.3 Width: (cm) 0.5 Depth: (cm) 0.3 Area: (cm) 0.118 Volume: (cm) 0.035 % Reduction in Area: 39.8% % Reduction in Volume: 10.3% Epithelialization: Small (1-33%) Tunneling: No Undermining: Yes Starting Position (o'clock): 12 Ending Position (o'clock): 12 Maximum Distance: (cm) 0.3 Wound Description Classification: Grade 2 Wound Margin: Distinct, outline attached Exudate Amount:  Medium Exudate Type: Serosanguineous Exudate Color: red, brown Foul Odor After Cleansing: No Slough/Fibrino No Wound Bed Granulation Amount: Large (67-100%) Exposed Structure Granulation Quality: Pink, Pale Fascia Exposed: No Necrotic Amount: None Present (0%) Fat Layer (Subcutaneous Tissue) Exposed: Yes Tendon Exposed: No Muscle Exposed: No Joint Exposed: No Bone Exposed: No Assessment Notes callous to periwound. Treatment Notes Wound #4 (Toe Second) Wound Laterality: Right Cleanser Soap and Water Discharge Instruction: May shower and wash wound with dial antibacterial soap and water prior to dressing change. Peri-Wound Care Topical Primary Dressing KerraCel Ag Gelling Fiber Dressing, 2x2 in (silver alginate) Discharge Instruction: Apply silver alginate to wound bed as instructed Secondary Dressing Woven Gauze Sponges 2x2 in Discharge Instruction: Apply over primary dressing as directed. Optifoam Non-Adhesive Dressing, 4x4 in Discharge Instruction: Apply as donut over dressing Secured With Conforming Stretch Gauze Bandage, Sterile 2x75 (in/in) Discharge Instruction: Secure with stretch gauze as directed. Transpore Surgical Tape, 2x10 (in/yd) Discharge Instruction: Secure dressing with tape as directed. Compression Wrap Compression Stockings Add-Ons Electronic Signature(s) Signed: 07/18/2021 5:13:20 PM By: Deon Pilling Entered By: Deon Pilling on 07/18/2021 08:42:10 -------------------------------------------------------------------------------- Vitals Details Patient Name: Date of Service: [name] Mins 07/18/2021 8:30 A M Medical Record Number: [id number] Patient Account Number: [id number] Date of Birth/Sex: Treating RN: [birth year] (65 y.o. Lorette Ang, Meta.Reding Primary Care Cartier Mapel: Antony Contras Other Clinician: Referring Airis Barbee: Treating Jessika Rothery/Extender: Olivia Mackie in Treatment: 9 Vital Signs Time Taken: 08:28 Temperature  (F): 97.9 Height (in): 72 Pulse (bpm): 76 Weight (lbs): 271 Respiratory Rate (breaths/min): 18 Body Mass Index (BMI): 36.8 Blood Pressure (mmHg): 131/80 Capillary Blood Glucose (mg/dl): 123 Reference Range: 80 - 120 mg / dl Electronic Signature(s)  Signed: 07/18/2021 5:13:20 PM By: Deon Pilling Entered By: Deon Pilling on 07/18/2021 08:35:46

## 2021-07-18 NOTE — Progress Notes (Signed)
[name] [name], [name] [name] ([id number]) Visit Report for 07/18/2021 Chief Complaint Document Details Patient Name: Date of Service: [name] [name] 07/18/2021 8:30 A M Medical Record Number: [id number] Patient Account Number: [id number] Date of Birth/Sex: Treating RN: 11/23/1955 (65 y.o. [name] [name] Primary Care Provider: Tally Joe Other Clinician: Referring Provider: Treating Provider/Extender: Andrey Campanile in Treatment: 9 Information Obtained from: Patient Chief Complaint Bilateral toe wounds Electronic Signature(s) Signed: 07/18/2021 10:14:07 AM By: Geralyn Corwin DO Entered By: Geralyn Corwin on 07/18/2021 10:03:13 -------------------------------------------------------------------------------- Debridement Details Patient Name: Date of Service: [name] [name] 07/18/2021 8:30 A M Medical Record Number: [id number] Patient Account Number: [id number] Date of Birth/Sex: Treating RN: [birth year] (65 y.o. [name] [name] Primary Care Provider: Tally Joe Other Clinician: Referring Provider: Treating Provider/Extender: Andrey Campanile in Treatment: 9 Debridement Performed for Assessment: Wound #3 Right T Great oe Performed By: Physician Geralyn Corwin, DO Debridement Type: Debridement Severity of Tissue Pre Debridement: Fat layer exposed Level of Consciousness (Pre-procedure): Awake and Alert Pre-procedure Verification/Time Out Yes - 09:04 Taken: Start Time: 09:05 Pain Control: Lidocaine 4% T opical Solution T Area Debrided (L x W): otal 1 (cm) x 1.7 (cm) = 1.7 (cm) Tissue and other material debrided: Non-Viable, Callus, Subcutaneous Level: Skin/Subcutaneous Tissue Debridement Description: Excisional Instrument: Curette Bleeding: Minimum Hemostasis Achieved: Pressure End Time: 09:07 Response to Treatment: Procedure was tolerated well Level of Consciousness (Post- Awake and Alert procedure): Post Debridement  Measurements of Total Wound Length: (cm) 1 Width: (cm) 1.7 Depth: (cm) 0.5 Volume: (cm) 0.668 Character of Wound/Ulcer Post Debridement: Stable Severity of Tissue Post Debridement: Fat layer exposed Post Procedure Diagnosis Same as Pre-procedure Electronic Signature(s) Signed: 07/18/2021 10:14:07 AM By: Geralyn Corwin DO Signed: 07/18/2021 5:25:35 PM By: Antonieta Iba Entered By: Antonieta Iba on 07/18/2021 09:14:51 -------------------------------------------------------------------------------- Debridement Details Patient Name: Date of Service: [name] [name] 07/18/2021 8:30 A M Medical Record Number: [id number] Patient Account Number: [id number] Date of Birth/Sex: Treating RN: 12-Dec-1955 (65 y.o. [name] [name] Primary Care Provider: Tally Joe Other Clinician: Referring Provider: Treating Provider/Extender: Andrey Campanile in Treatment: 9 Debridement Performed for Assessment: Wound #4 Right T Second oe Performed By: Physician Geralyn Corwin, DO Debridement Type: Debridement Severity of Tissue Pre Debridement: Fat layer exposed Level of Consciousness (Pre-procedure): Awake and Alert Pre-procedure Verification/Time Out Yes - 09:04 Taken: Start Time: 09:09 Pain Control: Lidocaine 4% T opical Solution T Area Debrided (L x W): otal 0.3 (cm) x 0.5 (cm) = 0.15 (cm) Tissue and other material debrided: Non-Viable, Callus, Subcutaneous Level: Skin/Subcutaneous Tissue Debridement Description: Excisional Instrument: Curette Bleeding: Minimum Hemostasis Achieved: Pressure End Time: 09:11 Response to Treatment: Procedure was tolerated well Level of Consciousness (Post- Awake and Alert procedure): Post Debridement Measurements of Total Wound Length: (cm) 0.3 Width: (cm) 0.5 Depth: (cm) 0.3 Volume: (cm) 0.035 Character of Wound/Ulcer Post Debridement: Stable Severity of Tissue Post Debridement: Fat layer exposed Post Procedure  Diagnosis Same as Pre-procedure Electronic Signature(s) Signed: 07/18/2021 10:14:07 AM By: Geralyn Corwin DO Signed: 07/18/2021 5:25:35 PM By: Antonieta Iba Entered By: Antonieta Iba on 07/18/2021 09:16:22 -------------------------------------------------------------------------------- Debridement Details Patient Name: Date of Service: [name] [name] 07/18/2021 8:30 A M Medical Record Number: [id number] Patient Account Number: [id number] Date of Birth/Sex: Treating RN: Dec 11, 1955 (65 y.o. [name] [name] Primary Care Provider: Tally Joe Other Clinician: Referring Provider: Treating Provider/Extender: Andrey Campanile in Treatment: 9 Debridement Performed for Assessment: Wound #2 Left T Great oe Performed By: Physician  Geralyn Corwin, DO Debridement Type: Debridement Severity of Tissue Pre Debridement: Fat layer exposed Level of Consciousness (Pre-procedure): Awake and Alert Pre-procedure Verification/Time Out Yes - 09:04 Taken: Start Time: 09:07 Pain Control: Lidocaine 4% T opical Solution T Area Debrided (L x W): otal 1.4 (cm) x 1.5 (cm) = 2.1 (cm) Tissue and other material debrided: Non-Viable, Callus, Subcutaneous Level: Skin/Subcutaneous Tissue Debridement Description: Excisional Instrument: Curette Bleeding: Minimum Hemostasis Achieved: Pressure End Time: 09:09 Response to Treatment: Procedure was tolerated well Level of Consciousness (Post- Awake and Alert procedure): Post Debridement Measurements of Total Wound Length: (cm) 1.4 Width: (cm) 1.5 Depth: (cm) 0.1 Volume: (cm) 0.165 Character of Wound/Ulcer Post Debridement: Stable Severity of Tissue Post Debridement: Fat layer exposed Post Procedure Diagnosis Same as Pre-procedure Electronic Signature(s) Signed: 07/18/2021 10:14:07 AM By: Geralyn Corwin DO Signed: 07/18/2021 5:25:35 PM By: Antonieta Iba Entered By: Antonieta Iba on 07/18/2021  09:16:37 -------------------------------------------------------------------------------- HPI Details Patient Name: Date of Service: [name] [name]. 07/18/2021 8:30 A M Medical Record Number: [id number] Patient Account Number: [id number] Date of Birth/Sex: Treating RN: [birth year] (65 y.o. [name] [name] Primary Care Provider: Tally Joe Other Clinician: Referring Provider: Treating Provider/Extender: Andrey Campanile in Treatment: 9 History of Present Illness HPI Description: Admission 6/27 Mr. Joan Avetisyan is a 64 year old male with a past medical history of insulin-dependent type 2 diabetes, CABG in 2011 and essential hypertension that presents to the clinic for bilateral toe wounds. He has wounds to his left great toe that started in May 2022. He also has wounds to his right great toe and second toe that started in September 2021. He noticed they started as blisters and eventually worsened. He is currently using antibiotic ointment and keeping them covered. He reports minimal pain but has decreased sensation to his feet bilaterally. He denies signs of infection. 7/5; patient presents for 1 week follow-up. He did not receive supplies from our wound supplier [facility]. He ordered some silver alginate on [employer] and has been using this to the wound beds. He has also been aggressively offloading the areas. Overall he reports doing well and would like to hold off on the total contact cast since there has been improvement to the wound beds. He currently denies signs of infection. 7/11; patient presents for 1 week follow-up. He has been using silver alginate to the wound beds but taking his dressing off at night. He would like to continue using his offloading shoe and hold off on the cast for now. He currently denies signs of infection. 7/18; patient presents for 1 week follow-up. He has been using silver alginate to the wound beds. He has no issues or complaints today. Been  using his offloading shoe to the right foot. He denies signs of infection. 7/25; patient presents for 1 week follow-up. He continues to use silver alginate to the wound beds. He was mowing his lawn on Saturday And was on his feet most of the day. He has been using his offloading shoe to the left foot. He denies signs of infection.He has not been able to obtain x-rays yet of his feet. 8/29; Patient presents for follow-up. Its been 1 month since the patient was last seen. He states that he has been busy with work and is trying to retire. 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 He has been on his feet more because of this. He was able to obtain his bilateral feet x-rays recently. He currently denies systemic signs of infection. He  reports using silver alginate for dressing changes. Electronic Signature(s) Signed: 07/18/2021 10:14:07 AM By: Geralyn CorwinHoffman, Omarri Eich DO Entered By: Geralyn CorwinHoffman, Kellsie Grindle on 07/18/2021 10:05:19 -------------------------------------------------------------------------------- Physical Exam Details Patient Name: Date of Service: [name] HaltRUSH, WILLIA M C. 07/18/2021 8:30 A M Medical Record Number: [id number] Patient Account Number: [id number] Date of Birth/Sex: Treating RN: 1956-08-12 (65 y.o. [name] MichaelsM) Barnhart, Jodi Primary Care Provider: Tally JoeSwayne, David Other Clinician: Referring Provider: Treating Provider/Extender: Andrey CampanileHoffman, Eban Weick Swayne, David Weeks in Treatment: 9 Constitutional respirations regular, non-labored and within target range for patient.Marland Kitchen. Psychiatric pleasant and cooperative. Notes Right foot: Great toe and second toe have open wounds with nonviable tissue callus and granulation tissue present. probes close to bone. Left foot: Great toe has an open wound with nonviable tissue callus and granulation tissue present. No obvious signs of infection. Electronic Signature(s) Signed: 07/18/2021 10:14:07 AM By: Geralyn CorwinHoffman, Deannah Rossi DO Entered By: Geralyn CorwinHoffman, Tyhesha Dutson on 07/18/2021  10:07:45 -------------------------------------------------------------------------------- Physician Orders Details Patient Name: Date of Service: [name] HaltUSH, WILLIA M C. 07/18/2021 8:30 A M Medical Record Number: [id number] Patient Account Number: [id number] Date of Birth/Sex: Treating RN: 1956-08-12 (65 y.o. [name] MichaelsM) Barnhart, Jodi Primary Care Provider: Tally JoeSwayne, David Other Clinician: Referring Provider: Treating Provider/Extender: Andrey CampanileHoffman, Jeferson Boozer Swayne, David Weeks in Treatment: 9 Verbal / Phone Orders: No Diagnosis Coding ICD-10 Coding Code Description L97.529 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 L97.519 Non-pressure chronic ulcer of other part of right foot with unspecified severity E11.621 Type 2 diabetes mellitus with foot ulcer E11.40 Type 2 diabetes mellitus with diabetic neuropathy, unspecified Follow-up Appointments ppointment in 1 week. - with Dr. Mikey BussingHoffman (Tue, Thur or Friday) Return A Other: - [employer] for Supplies Bathing/ Shower/ Hygiene May shower and wash wound with soap and water. Edema Control - Lymphedema / SCD / Other Avoid standing for long periods of time. Off-Loading Wedge shoe to: - front offloader on right foot Additional Orders / Instructions Follow Nutritious Diet - -Increase protein, 100-120g daily -Keep blood sugars monitored Wound Treatment Wound #2 - T Great oe Wound Laterality: Left Cleanser: Soap and Water 1 x Per Day/15 Days Discharge Instructions: May shower and wash wound with dial antibacterial soap and water prior to dressing change. Prim Dressing: KerraCel Ag Gelling Fiber Dressing, 2x2 in (silver alginate) 1 x Per Day/15 Days ary Discharge Instructions: Apply silver alginate to wound bed as instructed Secondary Dressing: Woven Gauze Sponges 2x2 in 1 x Per Day/15 Days Discharge Instructions: Apply over primary dressing as directed. Secondary Dressing: Optifoam Non-Adhesive Dressing, 4x4 in 1 x Per  Day/15 Days Discharge Instructions: Apply as donut over dressing Secured With: Conforming Stretch Gauze Bandage, Sterile 2x75 (in/in) 1 x Per Day/15 Days Discharge Instructions: Secure with stretch gauze as directed. Secured With: Transpore Surgical Tape, 2x10 (in/yd) 1 x Per Day/15 Days Discharge Instructions: Secure dressing with tape as directed. Wound #3 - T Great oe Wound Laterality: Right Cleanser: Soap and Water 1 x Per Day/15 Days Discharge Instructions: May shower and wash wound with dial antibacterial soap and water prior to dressing change. Prim Dressing: KerraCel Ag Gelling Fiber Dressing, 2x2 in (silver alginate) 1 x Per Day/15 Days ary Discharge Instructions: Apply silver alginate to wound bed as instructed Secondary Dressing: Woven Gauze Sponges 2x2 in 1 x Per Day/15 Days Discharge Instructions: Apply over primary dressing as directed. Secondary Dressing: Optifoam Non-Adhesive Dressing, 4x4 in 1 x Per Day/15 Days Discharge Instructions: Apply as donut over dressing Secured With: Conforming Stretch Gauze Bandage, Sterile 2x75 (in/in) 1 x Per Day/15 Days  Discharge Instructions: Secure with stretch gauze as directed. Secured With: Transpore Surgical Tape, 2x10 (in/yd) 1 x Per Day/15 Days Discharge Instructions: Secure dressing with tape as directed. Wound #4 - T Second oe Wound Laterality: Right Cleanser: Soap and Water 1 x Per Day/15 Days Discharge Instructions: May shower and wash wound with dial antibacterial soap and water prior to dressing change. Prim Dressing: KerraCel Ag Gelling Fiber Dressing, 2x2 in (silver alginate) 1 x Per Day/15 Days ary Discharge Instructions: Apply silver alginate to wound bed as instructed Secondary Dressing: Woven Gauze Sponges 2x2 in 1 x Per Day/15 Days Discharge Instructions: Apply over primary dressing as directed. Secondary Dressing: Optifoam Non-Adhesive Dressing, 4x4 in 1 x Per Day/15 Days Discharge Instructions: Apply as donut over  dressing Secured With: Conforming Stretch Gauze Bandage, Sterile 2x75 (in/in) 1 x Per Day/15 Days Discharge Instructions: Secure with stretch gauze as directed. Secured With: Transpore Surgical Tape, 2x10 (in/yd) 1 x Per Day/15 Days Discharge Instructions: Secure dressing with tape as directed. Radiology Magnetic Resonance Imaging (MRI):Bilateral Feet - Will order after receive lab results from [facility]. Patient to sign release. - (ICD10 L97.519 - Non-pressure chronic ulcer of other part of right foot with unspecified severity) Electronic Signature(s) Signed: 07/18/2021 10:14:07 AM By: Geralyn Corwin DO Entered By: Geralyn Corwin on 07/18/2021 10:08:02 Prescription 07/18/2021 -------------------------------------------------------------------------------- Georga Bora C. Geralyn Corwin DO Patient Name: Provider: Mar 31, 1956 [id number] Date of Birth: NPI#: Judie Petit [id number] Sex: DEA #: [phone] Phone #: License #: Eligha Bridegroom [facility] Patient Address: [address] [place], [place] [address] [address] [place], [place] [address] [phone] Allergies No Known Allergies Provider's Orders Magnetic Resonance Imaging (MRI):Bilateral Feet - ICD10: L97.519 - Will order after receive lab results from [facility]. Patient to sign release. Hand Signature: Date(s): Electronic Signature(s) Signed: 07/18/2021 10:14:07 AM By: Geralyn Corwin DO Entered By: Geralyn Corwin on 07/18/2021 10:08:02 -------------------------------------------------------------------------------- Problem List Details Patient Name: Date of Service: [name] [name] 07/18/2021 8:30 A M Medical Record Number: [id number] Patient Account Number: [id number] Date of Birth/Sex: Treating RN: 03-27-1956 (65 y.o. [name] [name] Primary Care Provider: Tally Joe Other Clinician: Referring Provider: Treating Provider/Extender: Andrey Campanile in Treatment: 9 Active Problems ICD-10 Encounter Code Description Active Date MDM Diagnosis L97.529 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 05/16/2021 No Yes L97.519 Non-pressure chronic ulcer of other part of right foot with unspecified severity 05/16/2021 No Yes E11.621 Type 2 diabetes mellitus with foot ulcer 05/17/2021 No Yes E11.40 Type 2 diabetes mellitus with diabetic neuropathy, unspecified 05/17/2021 No Yes Inactive Problems Resolved Problems Electronic Signature(s) Signed: 07/18/2021 10:14:07 AM By: Geralyn Corwin DO Previous Signature: 07/18/2021 8:40:19 AM Version By: Antonieta Iba Entered By: Geralyn Corwin on 07/18/2021 09:29:17 -------------------------------------------------------------------------------- Progress Note Details Patient Name: Date of Service: [name] [name] 07/18/2021 8:30 A M Medical Record Number: [id number] Patient Account Number: [id number] Date of Birth/Sex: Treating RN: [birth year] (65 y.o. [name] [name] Primary Care Provider: Tally Joe Other Clinician: Referring Provider: Treating Provider/Extender: Andrey Campanile in Treatment: 9 Subjective Chief Complaint Information obtained from Patient Bilateral toe wounds History of Present Illness (HPI) Admission 6/27 Mr. Wendal Wilkie is a 65 year old male with a past medical history of insulin-dependent type 2 diabetes, CABG in 2011 and essential hypertension that presents to the clinic for bilateral toe wounds. He has wounds to his left great toe that started in May 2022. He also  has wounds to his right great toe and second toe that started in September 2021. He noticed they started as blisters and eventually worsened. He is currently using antibiotic ointment and keeping them covered. He reports minimal pain but has decreased sensation to his feet bilaterally. He denies signs of infection. 7/5; patient  presents for 1 week follow-up. He did not receive supplies from our wound supplier [facility]. He ordered some silver alginate on [employer] and has been using this to the wound beds. He has also been aggressively offloading the areas. Overall he reports doing well and would like to hold off on the total contact cast since there has been improvement to the wound beds. He currently denies signs of infection. 7/11; patient presents for 1 week follow-up. He has been using silver alginate to the wound beds but taking his dressing off at night. He would like to continue using his offloading shoe and hold off on the cast for now. He currently denies signs of infection. 7/18; patient presents for 1 week follow-up. He has been using silver alginate to the wound beds. He has no issues or complaints today. Been using his offloading shoe to the right foot. He denies signs of infection. 7/25; patient presents for 1 week follow-up. He continues to use silver alginate to the wound beds. He was mowing his lawn on Saturday And was on his feet most of the day. He has been using his offloading shoe to the left foot. He denies signs of infection.He has not been able to obtain x-rays yet of his feet. 8/29; Patient presents for follow-up. Its been 1 month since the patient was last seen. He states that he has been busy with work and is trying to retire. 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 He has been on his feet more because of this. He was able to obtain his bilateral feet x-rays recently. He currently denies systemic signs of infection. He reports using silver alginate for dressing changes. Patient History Information obtained from Patient. Family History Cancer - Mother, Heart Disease - Father, Hypertension - Father, Stroke - Siblings. Social History Never smoker, Marital Status - Married, Alcohol Use - Rarely - beer, Drug Use - No History, Caffeine Use - Daily. Medical History Eyes Denies history of  Cataracts, Glaucoma, Optic Neuritis Hematologic/Lymphatic Denies history of Anemia, Hemophilia, Human Immunodeficiency Virus, Lymphedema, Sickle Cell Disease Respiratory Patient has history of Sleep Apnea - reported ; uses CPAP Denies history of Aspiration, Asthma, Chronic Obstructive Pulmonary Disease (COPD), Pneumothorax, Tuberculosis Cardiovascular Patient has history of Coronary Artery Disease - reported, Hypertension - reported Denies history of Angina, Arrhythmia, Congestive Heart Failure, Deep Vein Thrombosis, Hypotension, Myocardial Infarction, Peripheral Arterial Disease, Peripheral Venous Disease, Phlebitis, Vasculitis Gastrointestinal Denies history of Cirrhosis , Colitis, Crohnoos, Hepatitis A, Hepatitis B, Hepatitis C Endocrine Patient has history of Type II Diabetes - 2009 Denies history of Type I Diabetes Genitourinary Denies history of End Stage Renal Disease Immunological Denies history of Lupus Erythematosus, Raynaudoos, Scleroderma Integumentary (Skin) Denies history of History of Burn Musculoskeletal Patient has history of Osteoarthritis - knees Denies history of Gout, Rheumatoid Arthritis, Osteomyelitis Neurologic Patient has history of Neuropathy - minor Denies history of Dementia, Quadriplegia, Paraplegia, Seizure Disorder Oncologic Denies history of Received Chemotherapy, Received Radiation Psychiatric Patient has history of 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 Denies history of Anorexia/bulimia Hospitalization/Surgery History - by pass heart. - urology stent. - ORIF of left 2nd toe. Medical A Surgical History Notes nd  Cardiovascular blockages 2011 Endocrine wears monitor in skin that checks CBG constantly Objective Constitutional respirations regular, non-labored and within target range for patient.. Vitals Time Taken: 8:28 AM, Height: 72 in, Weight: 271 lbs, BMI: 36.8, Temperature: 97.9 F, Pulse: 76 bpm, Respiratory Rate: 18  breaths/min, Blood Pressure: 131/80 mmHg, Capillary Blood Glucose: 123 mg/dl. Psychiatric pleasant and cooperative. General Notes: Right foot: Great toe and second toe have open wounds with nonviable tissue callus and granulation tissue present. probes close to bone. Left foot: Great toe has an open wound with nonviable tissue callus and granulation tissue present. No obvious signs of infection. Integumentary (Hair, Skin) Wound #2 status is Open. Original cause of wound was Gradually Appeared. The date acquired was: 03/20/2021. The wound has been in treatment 9 weeks. The wound is located on the Left T Great. The wound measures 1.4cm length x 1.5cm width x 0.1cm depth; 1.649cm^2 area and 0.165cm^3 volume. There is Fat oe Layer (Subcutaneous Tissue) exposed. There is no tunneling or undermining noted. There is a medium amount of serosanguineous drainage noted. The wound margin is thickened. There is large (67-100%) red, pink, pale granulation within the wound bed. There is a small (1-33%) amount of necrotic tissue within the wound bed including Adherent Slough. General Notes: callous to periwound. Wound #3 status is Open. Original cause of wound was Gradually Appeared. The date acquired was: 07/21/2020. The wound has been in treatment 9 weeks. The wound is located on the Right T Great. The wound measures 1cm length x 1.7cm width x 0.5cm depth; 1.335cm^2 area and 0.668cm^3 volume. There is Fat oe Layer (Subcutaneous Tissue) exposed. There is no undermining noted, however, there is tunneling at 12:00 with a maximum distance of 0.4cm. There is a medium amount of serosanguineous drainage noted. The wound margin is thickened. There is medium (34-66%) red granulation within the wound bed. There is a medium (34-66%) amount of necrotic tissue within the wound bed including Adherent Slough. General Notes: callous to periwound. Wound #4 status is Open. Original cause of wound was Gradually Appeared. The date  acquired was: 05/02/2021. The wound has been in treatment 9 weeks. The wound is located on the Right T Second. The wound measures 0.3cm length x 0.5cm width x 0.3cm depth; 0.118cm^2 area and 0.035cm^3 volume. There is oe Fat Layer (Subcutaneous Tissue) exposed. There is no tunneling noted, however, there is undermining starting at 12:00 and ending at 12:00 with a maximum distance of 0.3cm. There is a medium amount of serosanguineous drainage noted. The wound margin is distinct with the outline attached to the wound base. There is large (67-100%) pink, pale granulation within the wound bed. There is no necrotic tissue within the wound bed. General Notes: callous to periwound. Assessment Active Problems ICD-10 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 Non-pressure chronic ulcer of other part of right foot with unspecified severity Type 2 diabetes mellitus with foot ulcer Type 2 diabetes mellitus with diabetic neuropathy, unspecified Unfortunately patient has not followed up for 1 month. His wounds are measuring larger. I recommended an MRI since his right great toe feels like it probes to bone. He states he has metal in his body from surgery. I will likely get a CT scan but need a more recent CMP that is noted in our system. He states he follows with [facility] and he will sign a release for [place] to see his lab results. Once I have this I will be able to order the correct  imaging. No obvious signs of infection on exam. I debrided nonviable tissue. I recommended following up weekly. 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 Procedures Wound #2 Pre-procedure diagnosis of Wound #2 is a Diabetic Wound/Ulcer of the Lower Extremity located on the Left T Great .Severity of Tissue Pre Debridement is: oe Fat layer exposed. There was a Excisional Skin/Subcutaneous Tissue Debridement with a total area of 2.1 sq  cm performed by Geralyn Corwin, DO. With the following instrument(s): Curette to remove Non-Viable tissue/material. Material removed includes Callus and Subcutaneous Tissue and after achieving pain control using Lidocaine 4% T opical Solution. No specimens were taken. A time out was conducted at 09:04, prior to the start of the procedure. A Minimum amount of bleeding was controlled with Pressure. The procedure was tolerated well. Post Debridement Measurements: 1.4cm length x 1.5cm width x 0.1cm depth; 0.165cm^3 volume. Character of Wound/Ulcer Post Debridement is stable. Severity of Tissue Post Debridement is: Fat layer exposed. Post procedure Diagnosis Wound #2: Same as Pre-Procedure Wound #3 Pre-procedure diagnosis of Wound #3 is a Diabetic Wound/Ulcer of the Lower Extremity located on the Right T Great .Severity of Tissue Pre Debridement is: oe Fat layer exposed. There was a Excisional Skin/Subcutaneous Tissue Debridement with a total area of 1.7 sq cm performed by Geralyn Corwin, DO. With the following instrument(s): Curette to remove Non-Viable tissue/material. Material removed includes Callus and Subcutaneous Tissue and after achieving pain control using Lidocaine 4% T opical Solution. No specimens were taken. A time out was conducted at 09:04, prior to the start of the procedure. A Minimum amount of bleeding was controlled with Pressure. The procedure was tolerated well. Post Debridement Measurements: 1cm length x 1.7cm width x 0.5cm depth; 0.668cm^3 volume. Character of Wound/Ulcer Post Debridement is stable. Severity of Tissue Post Debridement is: Fat layer exposed. Post procedure Diagnosis Wound #3: Same as Pre-Procedure Wound #4 Pre-procedure diagnosis of Wound #4 is a Diabetic Wound/Ulcer of the Lower Extremity located on the Right T Second .Severity of Tissue Pre Debridement oe is: Fat layer exposed. There was a Excisional Skin/Subcutaneous Tissue Debridement with a total area of  0.15 sq cm performed by Geralyn Corwin, DO. With the following instrument(s): Curette to remove Non-Viable tissue/material. Material removed includes Callus and Subcutaneous Tissue and after achieving pain control using Lidocaine 4% T opical Solution. No specimens were taken. A time out was conducted at 09:04, prior to the start of the procedure. A Minimum amount of bleeding was controlled with Pressure. The procedure was tolerated well. Post Debridement Measurements: 0.3cm length x 0.5cm width x 0.3cm depth; 0.035cm^3 volume. Character of Wound/Ulcer Post Debridement is stable. Severity of Tissue Post Debridement is: Fat layer exposed. Post procedure Diagnosis Wound #4: Same as Pre-Procedure Plan Follow-up Appointments: Return Appointment in 1 week. - with Dr. Mikey Bussing (Tue, Thur or Friday) Other: - [facility]) for Supplies Bathing/ Shower/ Hygiene: May shower and wash wound with soap and water. Edema Control - Lymphedema / SCD / Other: Avoid standing for long periods of time. Off-Loading: Wedge shoe to: - front offloader on right foot Additional Orders / Instructions: Follow Nutritious Diet - -Increase protein, 100-120g daily -Keep blood sugars monitored Radiology ordered were: Magnetic Resonance Imaging (MRI):Bilateral Feet - Will order after receive lab results from [facility]. Patient to sign release. WOUND #2: - T Great Wound Laterality: Left oe Cleanser: Soap and Water 1 x Per Day/15 Days Discharge Instructions:  May shower and wash wound with dial antibacterial soap and water prior to dressing change. Prim Dressing: KerraCel Ag Gelling Fiber Dressing, 2x2 in (silver alginate) 1 x Per Day/15 Days ary Discharge Instructions: Apply silver alginate to wound bed as instructed Secondary Dressing: Woven Gauze Sponges 2x2 in 1 x Per Day/15 Days Discharge Instructions: Apply over primary dressing as directed. Secondary Dressing: Optifoam Non-Adhesive Dressing, 4x4 in 1 x Per Day/15  Days Discharge Instructions: Apply as donut over dressing Secured With: Conforming Stretch Gauze Bandage, Sterile 2x75 (in/in) 1 x Per Day/15 Days Discharge Instructions: Secure with stretch gauze as directed. Secured With: Transpore Surgical Tape, 2x10 (in/yd) 1 x Per Day/15 Days Discharge Instructions: Secure dressing with tape as directed. WOUND #3: - T Great Wound Laterality: Right oe Cleanser: Soap and Water 1 x Per Day/15 Days Discharge Instructions: May shower and wash wound with dial antibacterial soap and water prior to dressing change. Prim Dressing: KerraCel Ag Gelling Fiber Dressing, 2x2 in (silver alginate) 1 x Per Day/15 Days ary Discharge Instructions: Apply silver alginate to wound bed as instructed Secondary Dressing: Woven Gauze Sponges 2x2 in 1 x Per Day/15 Days Discharge Instructions: Apply over primary dressing as directed. Secondary Dressing: Optifoam Non-Adhesive Dressing, 4x4 in 1 x Per Day/15 Days Discharge Instructions: Apply as donut over dressing Secured With: Conforming Stretch Gauze Bandage, Sterile 2x75 (in/in) 1 x Per Day/15 Days Discharge Instructions: Secure with stretch gauze as directed. Secured With: Transpore Surgical Tape, 2x10 (in/yd) 1 x Per Day/15 Days Discharge Instructions: Secure dressing with tape as directed. WOUND #4: - T Second Wound Laterality: Right oe Cleanser: Soap and Water 1 x Per Day/15 Days Discharge Instructions: May shower and wash wound with dial antibacterial soap and water prior to dressing change. Prim Dressing: KerraCel Ag Gelling Fiber Dressing, 2x2 in (silver alginate) 1 x Per Day/15 Days ary Discharge Instructions: Apply silver alginate to wound bed as instructed Secondary Dressing: Woven Gauze Sponges 2x2 in 1 x Per Day/15 Days Discharge Instructions: Apply over primary dressing as directed. Secondary Dressing: Optifoam Non-Adhesive Dressing, 4x4 in 1 x Per Day/15 Days Discharge Instructions: Apply as donut over  dressing Secured With: Conforming Stretch Gauze Bandage, Sterile 2x75 (in/in) 1 x Per Day/15 Days Discharge Instructions: Secure with stretch gauze as directed. Secured With: Transpore Surgical Tape, 2x10 (in/yd) 1 x Per Day/15 Days Discharge Instructions: Secure dressing with tape as directed. 1. In office sharp debridement 2. Release form for lab results 3. CT scan 4. Follow-up in 1 week Electronic Signature(s) Signed: 07/18/2021 10:14:07 AM By: Geralyn Corwin DO Entered By: Geralyn Corwin on 07/18/2021 10:13:23 -------------------------------------------------------------------------------- HxROS Details Patient Name: Date of Service: [name] [name] 07/18/2021 8:30 A M Medical Record Number: [id number] Patient Account Number: [id number] Date of Birth/Sex: Treating RN: 07-24-1956 (65 y.o. [name] [name] Primary Care Provider: Tally Joe Other Clinician: Referring Provider: Treating Provider/Extender: Andrey Campanile in Treatment: 9 Information Obtained From Patient Eyes Medical History: Negative for: Cataracts; Glaucoma; Optic Neuritis Hematologic/Lymphatic Medical History: Negative for: Anemia; Hemophilia; Human Immunodeficiency Virus; Lymphedema; Sickle Cell Disease Respiratory Medical History: Positive for: Sleep Apnea - reported ; uses CPAP Negative for: Aspiration; Asthma; Chronic Obstructive Pulmonary Disease (COPD); Pneumothorax; Tuberculosis Cardiovascular Medical History: Positive for: Coronary Artery Disease - reported; Hypertension - reported Negative for: Angina; Arrhythmia; Congestive Heart Failure; Deep Vein Thrombosis; Hypotension; Myocardial Infarction; Peripheral Arterial Disease; Peripheral Venous Disease; Phlebitis; Vasculitis Past Medical History Notes: blockages 2011 Gastrointestinal Medical History: Negative for: Cirrhosis ; Colitis; Crohns;  Hepatitis A; Hepatitis B; Hepatitis C Endocrine Medical  History: Positive for: Type II Diabetes - 2009 Negative for: Type I Diabetes Past Medical History Notes: wears monitor in skin that checks CBG constantly Time with diabetes: 2009 Treated with: Insulin Blood sugar tested every day: No Blood sugar testing results: Breakfast: 90-120 Genitourinary Medical History: Negative for: End Stage Renal Disease Immunological Medical History: Negative for: Lupus Erythematosus; Raynauds; Scleroderma Integumentary (Skin) Medical History: Negative for: History of Burn Musculoskeletal Medical History: Positive for: Osteoarthritis - knees Negative for: Gout; Rheumatoid Arthritis; Osteomyelitis Neurologic Medical History: Positive for: Neuropathy - minor Negative for: Dementia; Quadriplegia; Paraplegia; Seizure Disorder Oncologic Medical History: Negative for: Received Chemotherapy; Received Radiation Psychiatric Medical History: 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 Negative for: Anorexia/bulimia Immunizations Pneumococcal Vaccine: Received Pneumococcal Vaccination: Yes Received Pneumococcal Vaccination On or After 60th Birthday: Yes Immunization Notes: up to date on pneumonia shots per his PCP Implantable Devices None Hospitalization / Surgery History Type of Hospitalization/Surgery by pass heart urology stent ORIF of left 2nd toe Family and Social History Cancer: Yes - Mother; Heart Disease: Yes - Father; Hypertension: Yes - Father; Stroke: Yes - Siblings; Never smoker; Marital Status - Married; Alcohol Use: Rarely - beer; Drug Use: No History; Caffeine Use: Daily; Financial Concerns: No; Food, Clothing or Shelter Needs: No; Support System Lacking: No; Transportation Concerns: No Electronic Signature(s) Signed: 07/18/2021 10:14:07 AM By: Geralyn Corwin DO Signed: 07/18/2021 5:25:35 PM By: Antonieta Iba Entered By: Geralyn Corwin on 07/18/2021  10:05:26 -------------------------------------------------------------------------------- SuperBill Details Patient Name: Date of Service: [name] [name] 07/18/2021 Medical Record Number: [id number] Patient Account Number: [id number] Date of Birth/Sex: Treating RN: 15-Jun-1956 (65 y.o. [name] [name] Primary Care Provider: Tally Joe Other Clinician: Referring Provider: Treating Provider/Extender: Andrey Campanile in Treatment: 9 Diagnosis Coding ICD-10 Codes Code Description [phone] 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 L97.519 Non-pressure chronic ulcer of other part of right foot with unspecified severity E11.621 Type 2 diabetes mellitus with foot ulcer E11.40 Type 2 diabetes mellitus with diabetic neuropathy, unspecified Facility Procedures CPT4 Code: [id number] Description: 11042 - DEB SUBQ TISSUE 20 SQ CM/< ICD-10 Diagnosis Description L97.529 Non-pressure chronic ulcer of other part of left foot with unspecified severit L97.519 Non-pressure chronic ulcer of other part of right foot with unspecified severi Modifier: y ty Quantity: 1 Physician Procedures : CPT4 Code Description Modifier [id number] 11042 - WC PHYS SUBQ TISS 20 SQ CM ICD-10 Diagnosis Description L97.529 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 L97.519 Non-pressure chronic ulcer of other part of right  foot with unspecified severity Quantity: 1 Electronic Signature(s) Signed: 07/18/2021 10:14:07 AM By: Geralyn Corwin DO Entered By: Geralyn Corwin on 07/18/2021 10:13:30

## 2021-07-26 ENCOUNTER — Encounter (HOSPITAL_BASED_OUTPATIENT_CLINIC_OR_DEPARTMENT_OTHER): Payer: 59 | Attending: Internal Medicine | Admitting: Internal Medicine

## 2021-08-08 ENCOUNTER — Other Ambulatory Visit: Payer: Self-pay | Admitting: Internal Medicine

## 2021-08-08 ENCOUNTER — Other Ambulatory Visit: Payer: Self-pay

## 2021-08-08 ENCOUNTER — Inpatient Hospital Stay (HOSPITAL_COMMUNITY): Payer: 59

## 2021-08-08 ENCOUNTER — Encounter (HOSPITAL_BASED_OUTPATIENT_CLINIC_OR_DEPARTMENT_OTHER): Payer: 59 | Admitting: Internal Medicine

## 2021-08-08 ENCOUNTER — Encounter (HOSPITAL_COMMUNITY): Payer: Self-pay

## 2021-08-08 ENCOUNTER — Inpatient Hospital Stay (HOSPITAL_COMMUNITY)
Admission: EM | Admit: 2021-08-08 | Discharge: 2021-08-21 | DRG: 617 | Disposition: A | Discharge status: Home Health | Payer: 59 | Source: Ambulatory Visit | Attending: Internal Medicine | Admitting: Internal Medicine

## 2021-08-08 ENCOUNTER — Other Ambulatory Visit (HOSPITAL_COMMUNITY): Payer: Self-pay | Admitting: Internal Medicine

## 2021-08-08 ENCOUNTER — Emergency Department (HOSPITAL_COMMUNITY): Payer: 59

## 2021-08-08 DIAGNOSIS — E1165 Type 2 diabetes mellitus with hyperglycemia: Secondary | ICD-10-CM | POA: Diagnosis present

## 2021-08-08 DIAGNOSIS — E11628 Type 2 diabetes mellitus with other skin complications: Secondary | ICD-10-CM | POA: Diagnosis present

## 2021-08-08 DIAGNOSIS — Z9889 Other specified postprocedural states: Secondary | ICD-10-CM | POA: Diagnosis not present

## 2021-08-08 DIAGNOSIS — L02611 Cutaneous abscess of right foot: Secondary | ICD-10-CM | POA: Diagnosis present

## 2021-08-08 DIAGNOSIS — I129 Hypertensive chronic kidney disease with stage 1 through stage 4 chronic kidney disease, or unspecified chronic kidney disease: Secondary | ICD-10-CM | POA: Diagnosis present

## 2021-08-08 DIAGNOSIS — E0840 Diabetes mellitus due to underlying condition with diabetic neuropathy, unspecified: Secondary | ICD-10-CM

## 2021-08-08 DIAGNOSIS — L0390 Cellulitis, unspecified: Secondary | ICD-10-CM | POA: Diagnosis not present

## 2021-08-08 DIAGNOSIS — Z823 Family history of stroke: Secondary | ICD-10-CM

## 2021-08-08 DIAGNOSIS — Z79899 Other long term (current) drug therapy: Secondary | ICD-10-CM

## 2021-08-08 DIAGNOSIS — I10 Essential (primary) hypertension: Secondary | ICD-10-CM | POA: Diagnosis not present

## 2021-08-08 DIAGNOSIS — L97512 Non-pressure chronic ulcer of other part of right foot with fat layer exposed: Secondary | ICD-10-CM | POA: Insufficient documentation

## 2021-08-08 DIAGNOSIS — Z794 Long term (current) use of insulin: Secondary | ICD-10-CM | POA: Insufficient documentation

## 2021-08-08 DIAGNOSIS — M84477A Pathological fracture, right toe(s), initial encounter for fracture: Secondary | ICD-10-CM | POA: Diagnosis present

## 2021-08-08 DIAGNOSIS — K8020 Calculus of gallbladder without cholecystitis without obstruction: Secondary | ICD-10-CM | POA: Diagnosis present

## 2021-08-08 DIAGNOSIS — L97516 Non-pressure chronic ulcer of other part of right foot with bone involvement without evidence of necrosis: Secondary | ICD-10-CM | POA: Diagnosis present

## 2021-08-08 DIAGNOSIS — E1122 Type 2 diabetes mellitus with diabetic chronic kidney disease: Secondary | ICD-10-CM | POA: Diagnosis present

## 2021-08-08 DIAGNOSIS — D638 Anemia in other chronic diseases classified elsewhere: Secondary | ICD-10-CM | POA: Diagnosis present

## 2021-08-08 DIAGNOSIS — M79605 Pain in left leg: Secondary | ICD-10-CM | POA: Diagnosis not present

## 2021-08-08 DIAGNOSIS — Z951 Presence of aortocoronary bypass graft: Secondary | ICD-10-CM | POA: Insufficient documentation

## 2021-08-08 DIAGNOSIS — R7881 Bacteremia: Secondary | ICD-10-CM | POA: Diagnosis present

## 2021-08-08 DIAGNOSIS — M869 Osteomyelitis, unspecified: Secondary | ICD-10-CM

## 2021-08-08 DIAGNOSIS — F419 Anxiety disorder, unspecified: Secondary | ICD-10-CM | POA: Diagnosis present

## 2021-08-08 DIAGNOSIS — Z8249 Family history of ischemic heart disease and other diseases of the circulatory system: Secondary | ICD-10-CM

## 2021-08-08 DIAGNOSIS — E0843 Diabetes mellitus due to underlying condition with diabetic autonomic (poly)neuropathy: Secondary | ICD-10-CM | POA: Diagnosis not present

## 2021-08-08 DIAGNOSIS — L97529 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: Secondary | ICD-10-CM | POA: Diagnosis not present

## 2021-08-08 DIAGNOSIS — E1140 Type 2 diabetes mellitus with diabetic neuropathy, unspecified: Secondary | ICD-10-CM

## 2021-08-08 DIAGNOSIS — I2510 Atherosclerotic heart disease of native coronary artery without angina pectoris: Secondary | ICD-10-CM | POA: Diagnosis not present

## 2021-08-08 DIAGNOSIS — B951 Streptococcus, group B, as the cause of diseases classified elsewhere: Secondary | ICD-10-CM | POA: Diagnosis present

## 2021-08-08 DIAGNOSIS — E11621 Type 2 diabetes mellitus with foot ulcer: Secondary | ICD-10-CM | POA: Insufficient documentation

## 2021-08-08 DIAGNOSIS — L97522 Non-pressure chronic ulcer of other part of left foot with fat layer exposed: Secondary | ICD-10-CM

## 2021-08-08 DIAGNOSIS — Z6835 Body mass index (BMI) 35.0-35.9, adult: Secondary | ICD-10-CM

## 2021-08-08 DIAGNOSIS — E669 Obesity, unspecified: Secondary | ICD-10-CM | POA: Diagnosis present

## 2021-08-08 DIAGNOSIS — E1143 Type 2 diabetes mellitus with diabetic autonomic (poly)neuropathy: Secondary | ICD-10-CM | POA: Diagnosis present

## 2021-08-08 DIAGNOSIS — E782 Mixed hyperlipidemia: Secondary | ICD-10-CM | POA: Diagnosis not present

## 2021-08-08 DIAGNOSIS — M86171 Other acute osteomyelitis, right ankle and foot: Secondary | ICD-10-CM | POA: Diagnosis not present

## 2021-08-08 DIAGNOSIS — B954 Other streptococcus as the cause of diseases classified elsewhere: Secondary | ICD-10-CM | POA: Diagnosis present

## 2021-08-08 DIAGNOSIS — M65171 Other infective (teno)synovitis, right ankle and foot: Secondary | ICD-10-CM | POA: Diagnosis present

## 2021-08-08 DIAGNOSIS — Z20822 Contact with and (suspected) exposure to covid-19: Secondary | ICD-10-CM | POA: Diagnosis present

## 2021-08-08 DIAGNOSIS — R7989 Other specified abnormal findings of blood chemistry: Secondary | ICD-10-CM

## 2021-08-08 DIAGNOSIS — D72821 Monocytosis (symptomatic): Secondary | ICD-10-CM | POA: Diagnosis not present

## 2021-08-08 DIAGNOSIS — D75839 Thrombocytosis, unspecified: Secondary | ICD-10-CM | POA: Diagnosis not present

## 2021-08-08 DIAGNOSIS — L03116 Cellulitis of left lower limb: Secondary | ICD-10-CM | POA: Diagnosis present

## 2021-08-08 DIAGNOSIS — I2582 Chronic total occlusion of coronary artery: Secondary | ICD-10-CM | POA: Diagnosis present

## 2021-08-08 DIAGNOSIS — L03115 Cellulitis of right lower limb: Secondary | ICD-10-CM | POA: Diagnosis present

## 2021-08-08 DIAGNOSIS — E871 Hypo-osmolality and hyponatremia: Secondary | ICD-10-CM

## 2021-08-08 DIAGNOSIS — E222 Syndrome of inappropriate secretion of antidiuretic hormone: Secondary | ICD-10-CM | POA: Diagnosis not present

## 2021-08-08 DIAGNOSIS — N179 Acute kidney failure, unspecified: Secondary | ICD-10-CM | POA: Diagnosis present

## 2021-08-08 DIAGNOSIS — M25562 Pain in left knee: Secondary | ICD-10-CM | POA: Diagnosis present

## 2021-08-08 DIAGNOSIS — E875 Hyperkalemia: Secondary | ICD-10-CM | POA: Diagnosis not present

## 2021-08-08 DIAGNOSIS — M00271 Other streptococcal arthritis, right ankle and foot: Secondary | ICD-10-CM | POA: Diagnosis present

## 2021-08-08 DIAGNOSIS — Z801 Family history of malignant neoplasm of trachea, bronchus and lung: Secondary | ICD-10-CM

## 2021-08-08 DIAGNOSIS — L97519 Non-pressure chronic ulcer of other part of right foot with unspecified severity: Secondary | ICD-10-CM | POA: Diagnosis not present

## 2021-08-08 DIAGNOSIS — N1831 Chronic kidney disease, stage 3a: Secondary | ICD-10-CM | POA: Diagnosis present

## 2021-08-08 DIAGNOSIS — M7989 Other specified soft tissue disorders: Secondary | ICD-10-CM | POA: Diagnosis not present

## 2021-08-08 DIAGNOSIS — R748 Abnormal levels of other serum enzymes: Secondary | ICD-10-CM | POA: Diagnosis present

## 2021-08-08 DIAGNOSIS — L089 Local infection of the skin and subcutaneous tissue, unspecified: Secondary | ICD-10-CM | POA: Diagnosis present

## 2021-08-08 DIAGNOSIS — R339 Retention of urine, unspecified: Secondary | ICD-10-CM | POA: Diagnosis present

## 2021-08-08 DIAGNOSIS — Z7982 Long term (current) use of aspirin: Secondary | ICD-10-CM

## 2021-08-08 DIAGNOSIS — E559 Vitamin D deficiency, unspecified: Secondary | ICD-10-CM | POA: Diagnosis present

## 2021-08-08 DIAGNOSIS — B955 Unspecified streptococcus as the cause of diseases classified elsewhere: Secondary | ICD-10-CM | POA: Diagnosis not present

## 2021-08-08 DIAGNOSIS — E1169 Type 2 diabetes mellitus with other specified complication: Secondary | ICD-10-CM | POA: Diagnosis present

## 2021-08-08 DIAGNOSIS — K219 Gastro-esophageal reflux disease without esophagitis: Secondary | ICD-10-CM | POA: Diagnosis present

## 2021-08-08 DIAGNOSIS — R42 Dizziness and giddiness: Secondary | ICD-10-CM | POA: Diagnosis not present

## 2021-08-08 DIAGNOSIS — Z6841 Body Mass Index (BMI) 40.0 and over, adult: Secondary | ICD-10-CM | POA: Diagnosis not present

## 2021-08-08 LAB — CBC
HCT: 35.2 % — ABNORMAL LOW (ref 39.0–52.0)
Hemoglobin: 12 g/dL — ABNORMAL LOW (ref 13.0–17.0)
MCH: 29.3 pg (ref 26.0–34.0)
MCHC: 34.1 g/dL (ref 30.0–36.0)
MCV: 85.9 fL (ref 80.0–100.0)
Platelets: 277 10*3/uL (ref 150–400)
RBC: 4.1 MIL/uL — ABNORMAL LOW (ref 4.22–5.81)
RDW: 14.7 % (ref 11.5–15.5)
WBC: 18 10*3/uL — ABNORMAL HIGH (ref 4.0–10.5)
nRBC: 0 % (ref 0.0–0.2)

## 2021-08-08 LAB — RESP PANEL BY RT-PCR (FLU A&B, COVID) ARPGX2
Influenza A by PCR: NEGATIVE
Influenza B by PCR: NEGATIVE
SARS Coronavirus 2 by RT PCR: NEGATIVE

## 2021-08-08 LAB — LACTIC ACID, PLASMA: Lactic Acid, Venous: 1.4 mmol/L (ref 0.5–1.9)

## 2021-08-08 LAB — CBC WITH DIFFERENTIAL/PLATELET
Abs Immature Granulocytes: 0.35 10*3/uL — ABNORMAL HIGH (ref 0.00–0.07)
Basophils Absolute: 0.1 10*3/uL (ref 0.0–0.1)
Basophils Relative: 0 %
Eosinophils Absolute: 0 10*3/uL (ref 0.0–0.5)
Eosinophils Relative: 0 %
HCT: 40.3 % (ref 39.0–52.0)
Hemoglobin: 13.4 g/dL (ref 13.0–17.0)
Immature Granulocytes: 2 %
Lymphocytes Relative: 5 %
Lymphs Abs: 1 10*3/uL (ref 0.7–4.0)
MCH: 29.1 pg (ref 26.0–34.0)
MCHC: 33.3 g/dL (ref 30.0–36.0)
MCV: 87.6 fL (ref 80.0–100.0)
Monocytes Absolute: 0.7 10*3/uL (ref 0.1–1.0)
Monocytes Relative: 4 %
Neutro Abs: 17 10*3/uL — ABNORMAL HIGH (ref 1.7–7.7)
Neutrophils Relative %: 89 %
Platelets: 295 10*3/uL (ref 150–400)
RBC: 4.6 MIL/uL (ref 4.22–5.81)
RDW: 14.9 % (ref 11.5–15.5)
WBC: 19.2 10*3/uL — ABNORMAL HIGH (ref 4.0–10.5)
nRBC: 0 % (ref 0.0–0.2)

## 2021-08-08 LAB — COMPREHENSIVE METABOLIC PANEL
ALT: 88 U/L — ABNORMAL HIGH (ref 0–44)
AST: 63 U/L — ABNORMAL HIGH (ref 15–41)
Albumin: 3.2 g/dL — ABNORMAL LOW (ref 3.5–5.0)
Alkaline Phosphatase: 82 U/L (ref 38–126)
Anion gap: 13 (ref 5–15)
BUN: 35 mg/dL — ABNORMAL HIGH (ref 8–23)
CO2: 24 mmol/L (ref 22–32)
Calcium: 7.6 mg/dL — ABNORMAL LOW (ref 8.9–10.3)
Chloride: 102 mmol/L (ref 98–111)
Creatinine, Ser: 1.63 mg/dL — ABNORMAL HIGH (ref 0.61–1.24)
GFR, Estimated: 47 mL/min — ABNORMAL LOW (ref >60)
Glucose, Bld: 153 mg/dL — ABNORMAL HIGH (ref 70–99)
Potassium: 3.9 mmol/L (ref 3.5–5.1)
Sodium: 139 mmol/L (ref 135–145)
Total Bilirubin: 1.1 mg/dL (ref 0.3–1.2)
Total Protein: 7.3 g/dL (ref 6.5–8.1)

## 2021-08-08 LAB — CBG MONITORING, ED: Glucose-Capillary: 111 mg/dL — ABNORMAL HIGH (ref 70–99)

## 2021-08-08 LAB — C-REACTIVE PROTEIN: CRP: 13.9 mg/dL — ABNORMAL HIGH (ref <1.0)

## 2021-08-08 LAB — GLUCOSE, CAPILLARY: Glucose-Capillary: 190 mg/dL — ABNORMAL HIGH (ref 70–99)

## 2021-08-08 LAB — CREATININE, SERUM
Creatinine, Ser: 1.51 mg/dL — ABNORMAL HIGH (ref 0.61–1.24)
GFR, Estimated: 52 mL/min — ABNORMAL LOW (ref >60)

## 2021-08-08 LAB — SEDIMENTATION RATE: Sed Rate: 37 mm/hr — ABNORMAL HIGH (ref 0–16)

## 2021-08-08 IMAGING — MR MR FOOT*R* W/O CM
5 series · 40 of 40 positions shown · non-contrast
Comparison: Right great toe x-rays from same day. Right foot x-rays
dated [DATE].

CLINICAL DATA: Right great toe pain.

EXAM:
MRI OF THE RIGHT FOREFOOT WITHOUT CONTRAST
TECHNIQUE: Multiplanar, multisequence MR imaging of the right forefoot was
performed. No intravenous contrast was administered.

[Series 5: T1 · coronal · right · 3.0mm · 0.47mm/px · 11 of 43 slices shown (1 of 2)]
[im 1/43]
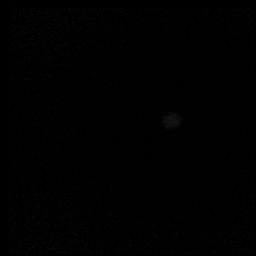
[im 5/43]
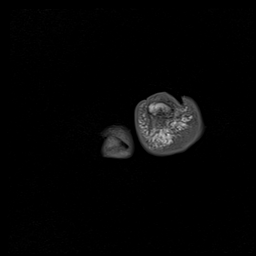
[im 9/43]
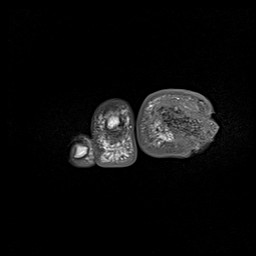
[im 13/43]
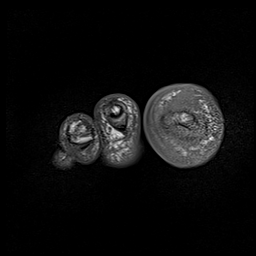
[im 17/43]
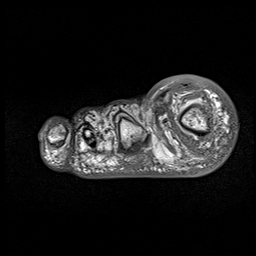
[im 22/43]
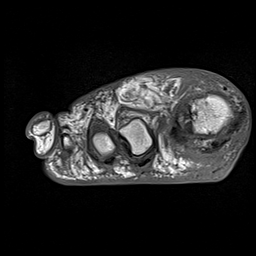
[im 26/43]
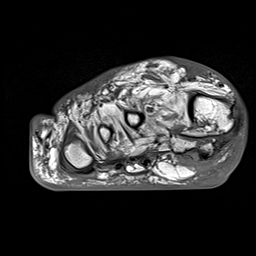
[im 30/43]
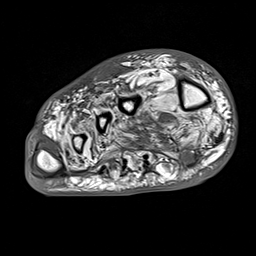
[im 34/43]
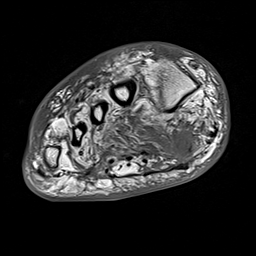
[im 38/43]
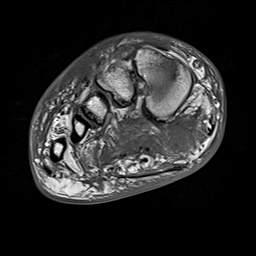
[im 43/43]
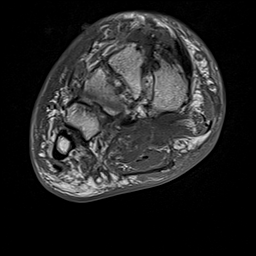

[Series 6: T2 fat-sat · coronal · right · 3.0mm · 0.38mm/px · 10 of 44 slices shown (1 of 2)]
[im 1/44]
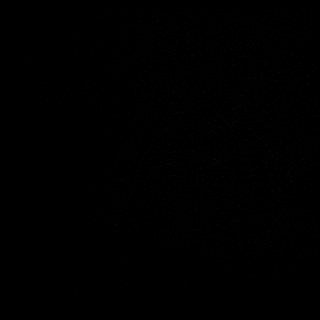
[im 5/44]
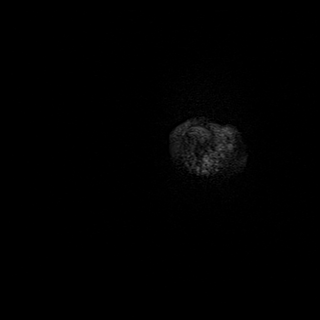
[im 10/44]
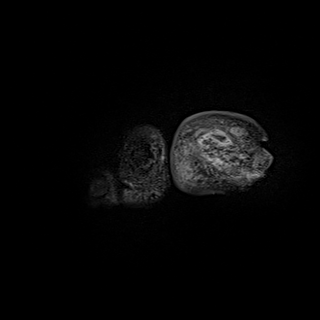
[im 15/44]
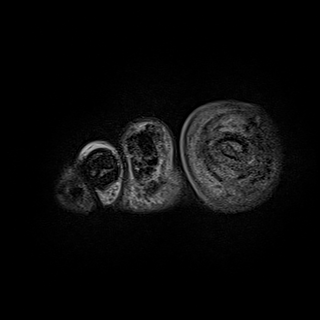
[im 20/44]
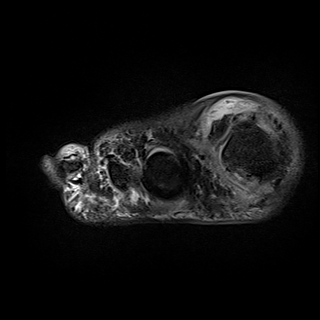
[im 24/44]
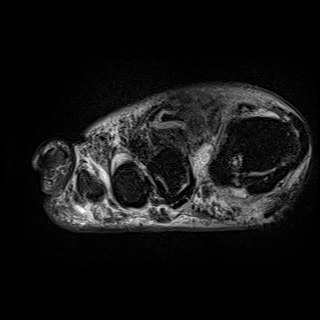
[im 29/44]
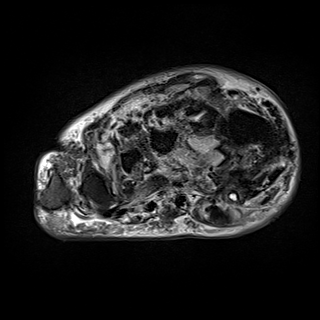
[im 34/44]
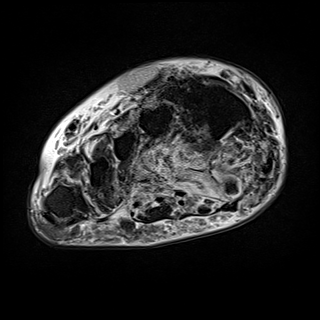
[im 39/44]
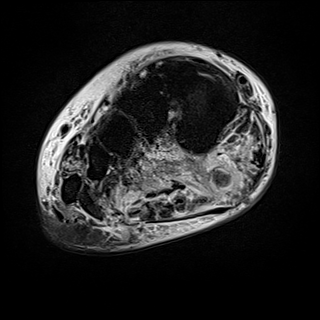
[im 44/44]
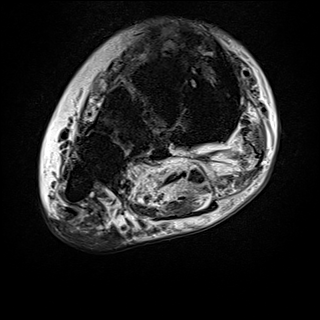

[Series 7: T2 fat-sat · axial · right · 3.0mm · 0.70mm/px · z∈[-60,+26]mm · 6 of 27 slices shown (2 of 2)]
[im 1/27]
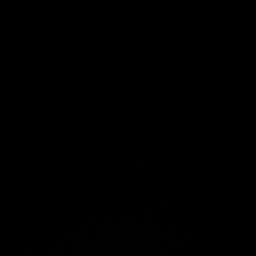
[im 6/27]
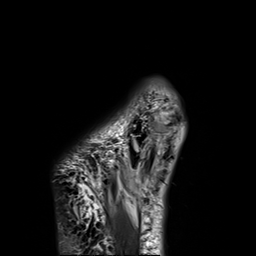
[im 11/27]
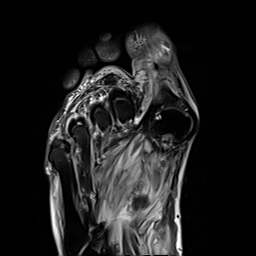
[im 16/27]
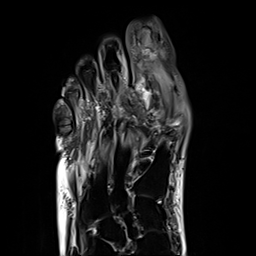
[im 21/27]
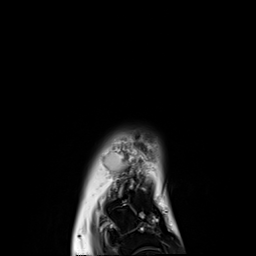
[im 27/27]
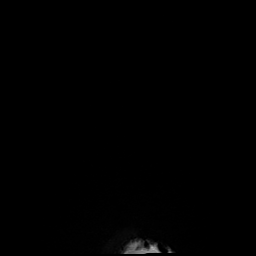

[Series 8: T1 · axial · right · 3.0mm · 0.70mm/px · z∈[-60,+26]mm · 6 of 27 slices shown (2 of 2)]
[im 1/27]
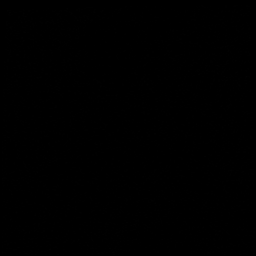
[im 6/27]
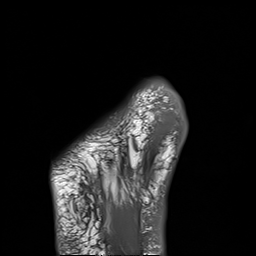
[im 11/27]
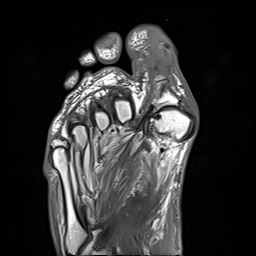
[im 16/27]
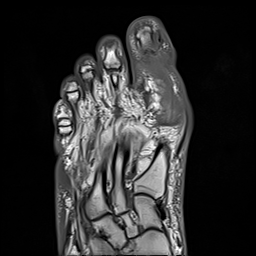
[im 21/27]
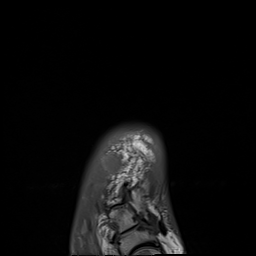
[im 27/27]
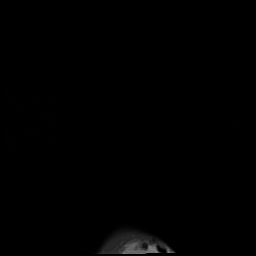

[Series 9: STIR · sagittal · right · 3.0mm · 0.35mm/px · 7 of 32 slices shown]
[im 1/32]
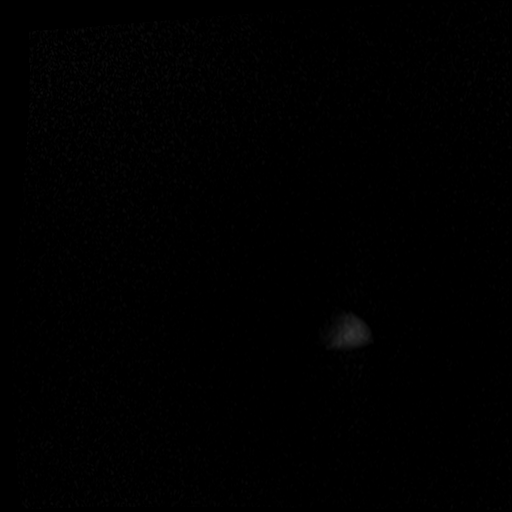
[im 6/32]
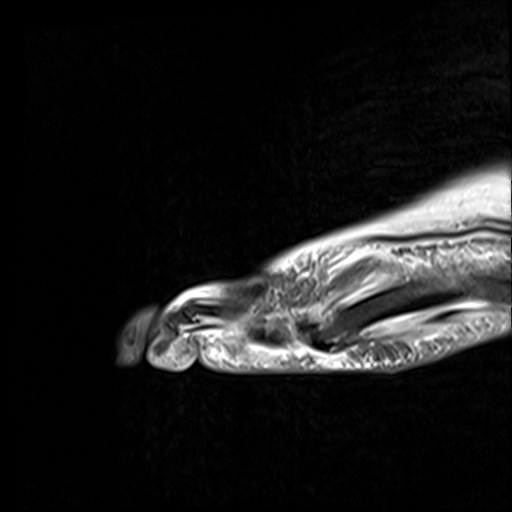
[im 11/32]
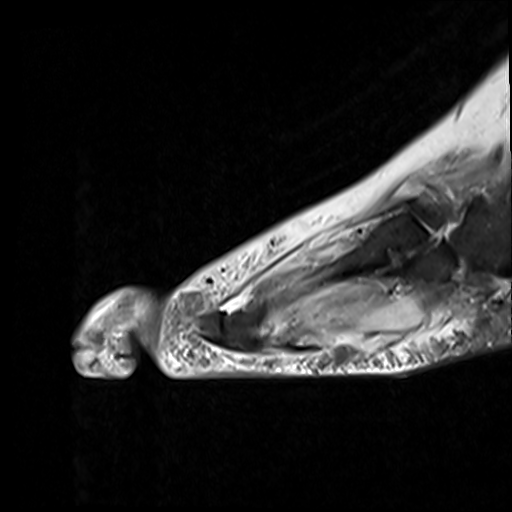
[im 16/32]
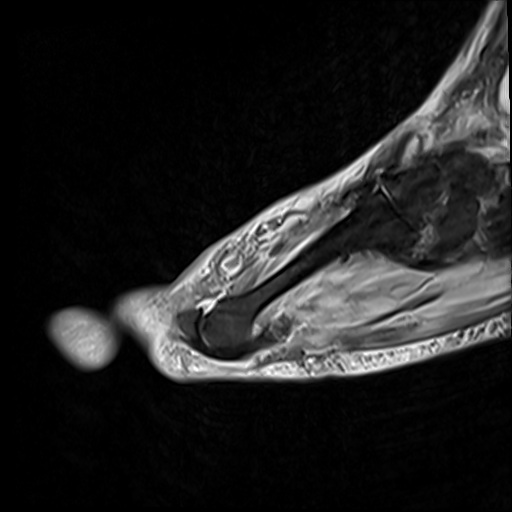
[im 21/32]
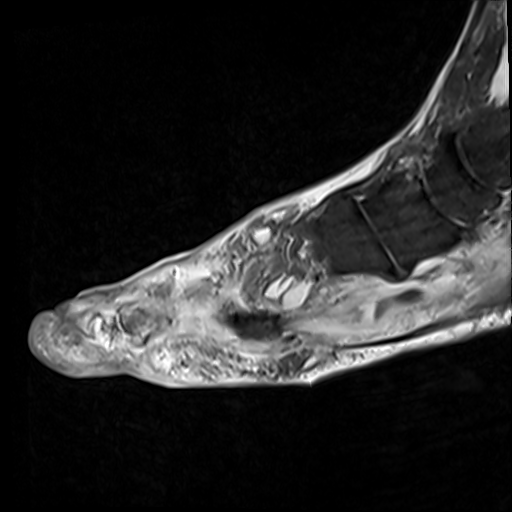
[im 26/32]
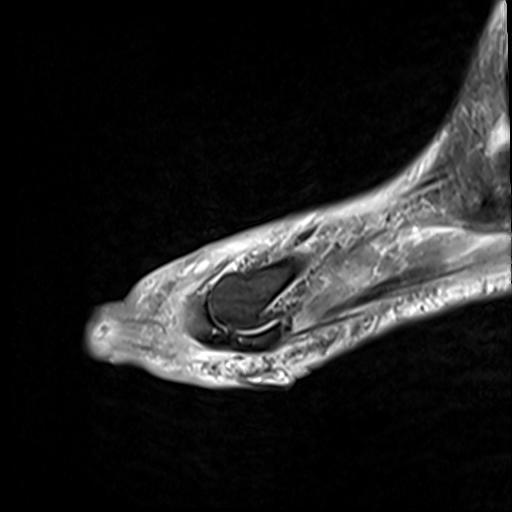
[im 32/32]
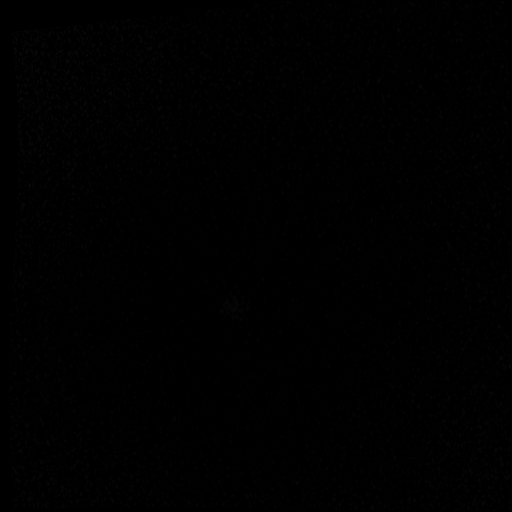

[40 of 40 positions shown; findings below may reference images not displayed]

FINDINGS: Bones/Joint/Cartilage

Abnormal marrow edema with corresponding decreased T1 marrow signal
involving the first proximal and distal phalanges. Similar signal
abnormality involving the second distal phalanx. Pathologic fracture
of the base of the first distal phalanx. No dislocation. Mild hallux
valgus deformity and first MTP joint osteoarthritis. Large first IP
joint effusion.

Ligaments

The first IP joint collateral ligaments are not visualized.

Muscles and Tendons
The flexor pollicis longus tendon is completely torn and retracted
to the mid first metatarsal (series 9, image 23). Associated
tenosynovitis. Increased T2 signal within and atrophy of the
intrinsic muscles of the forefoot, nonspecific, but likely related
to diabetic muscle changes.

Soft tissue
Diffuse soft tissue swelling. Ulceration at the plantar aspect of
the first IP joint communicating with the joint space (series 9,
image 23). 1.8 x 0.9 x 3.5 cm curvilinear gas and fluid collection
along the dorsal and lateral aspect of the great toe, consistent
with abscess (series 6, image 18). 1.8 x 1.1 x 1.1 cm fluid
collection between the first and second metatarsals (series 7, image
15). Additional multi loculated fluid collection in the superficial
dorsal foot overlying the second and third mid metatarsals,
measuring 2.7 x 1.8 x 0.7 cm (series 9, image 13).

Soft tissue ulceration at the tip of the second toe extending to
bone.
IMPRESSION: 1. Ulceration at the plantar aspect of the first IP joint
communicating with the joint space with underlying first IP joint
septic arthritis and osteomyelitis of the first proximal and distal
phalanges. Pathologic fracture of the base of the first distal
phalanx.
2. Ulceration at the tip of the second toe with underlying
osteomyelitis of the second distal phalanx.
3. Complete tear of the flexor pollicis longus tendon with
retraction to the mid first metatarsal. Associated infectious
tenosynovitis.
4. Multiple forefoot abscesses as described above, the largest a
curvilinear abscess involving the dorsal and lateral aspect of the
great toe, measuring up to 3.5 cm.

## 2021-08-08 IMAGING — US US ABDOMEN LIMITED
1 series · 15 of 25 positions shown · non-contrast
Comparison: None.

CLINICAL DATA: Elevated LFTs

EXAM:
ULTRASOUND ABDOMEN LIMITED RIGHT UPPER QUADRANT

[Series 1: us abdomen limited ruq mc & wl · 15 of 46 slices shown]
[im 1/46]
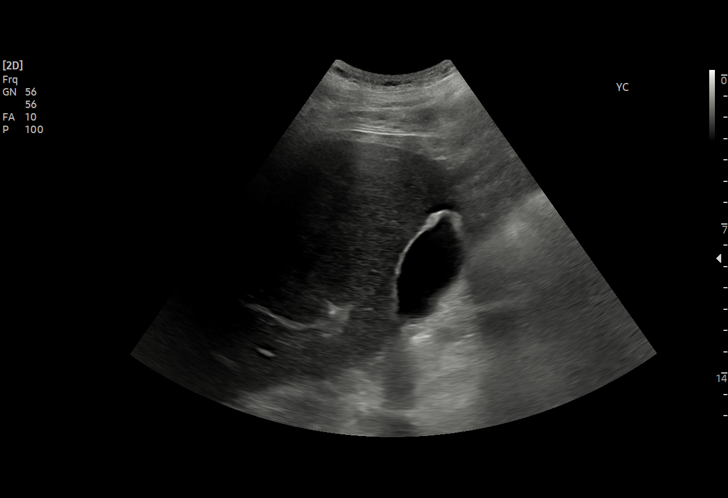
[im 4/46]
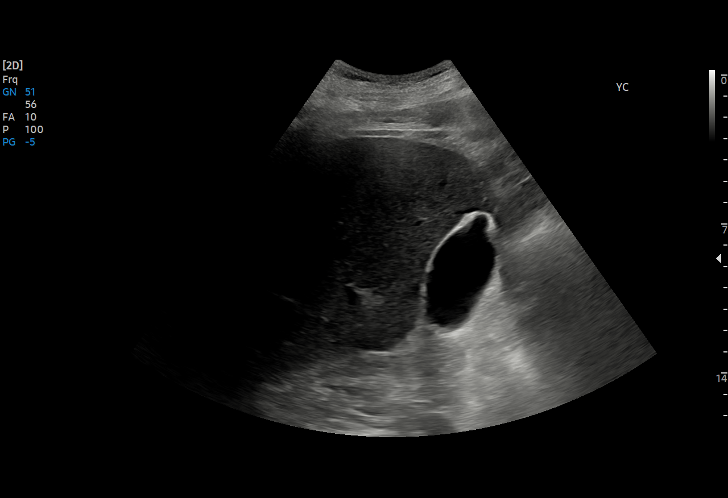
[im 8/46]
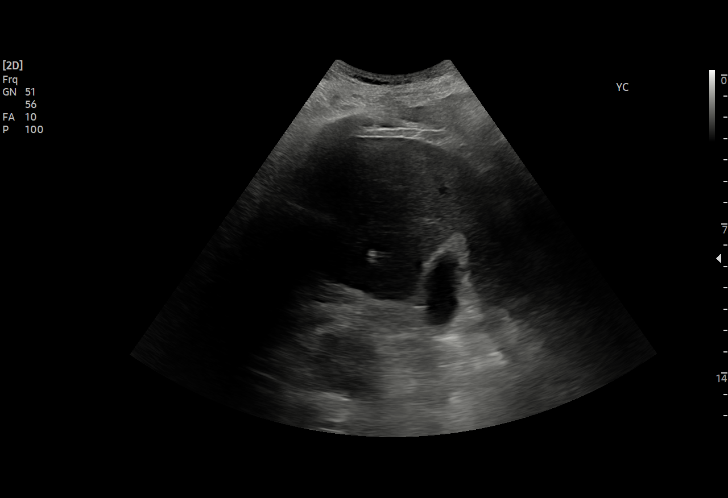
[im 10/46]
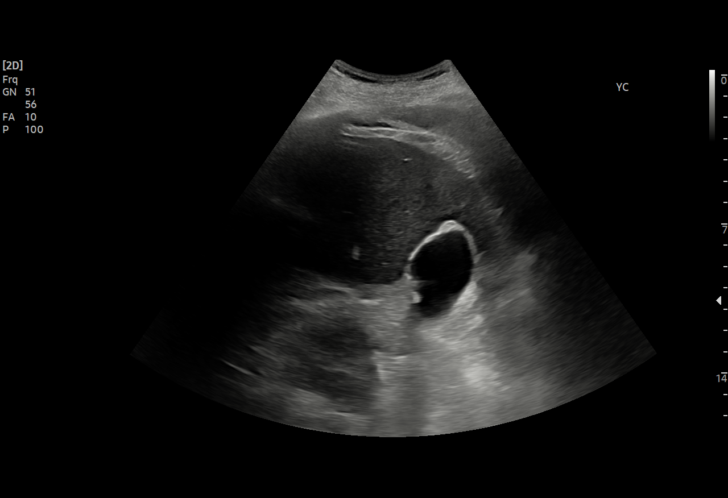
[im 14/46]
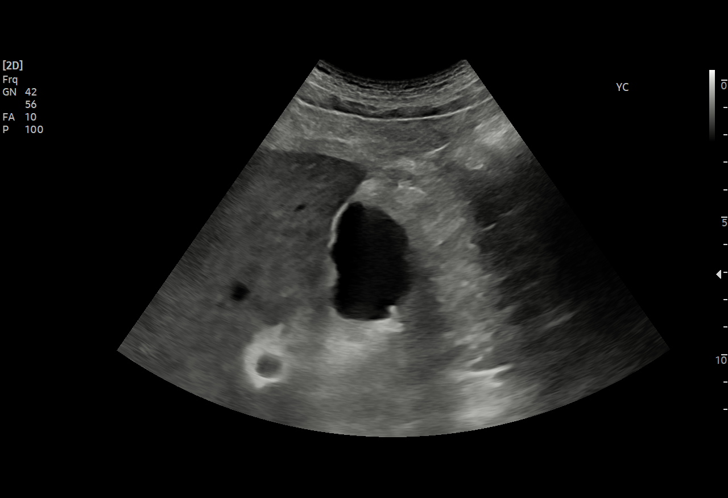
[im 17/46]
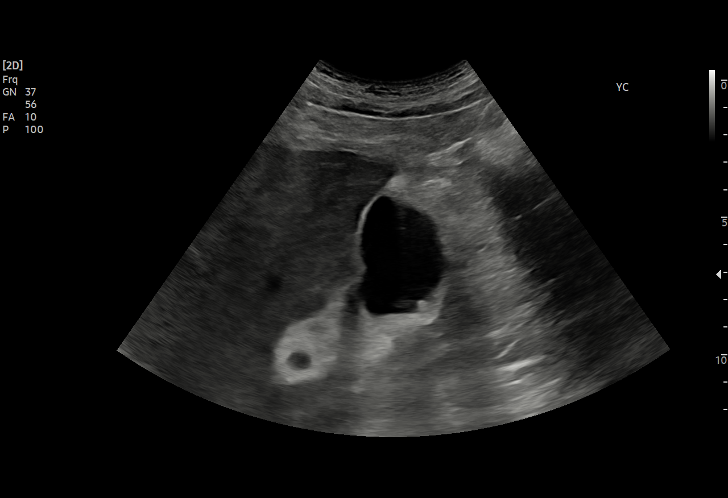
[im 19/46]
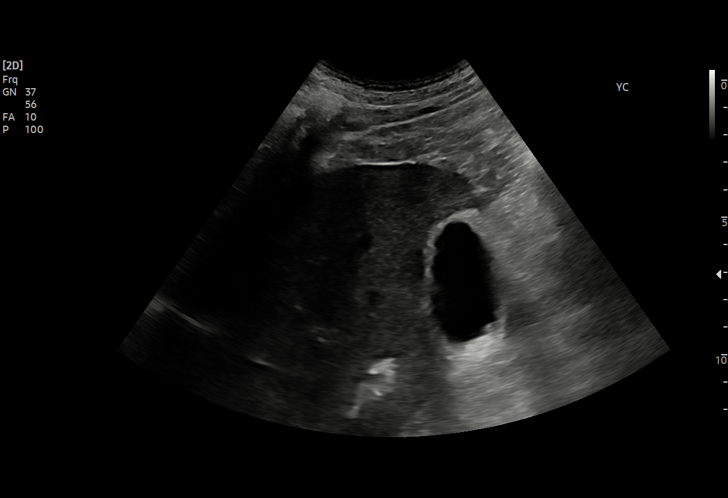
[im 23/46]
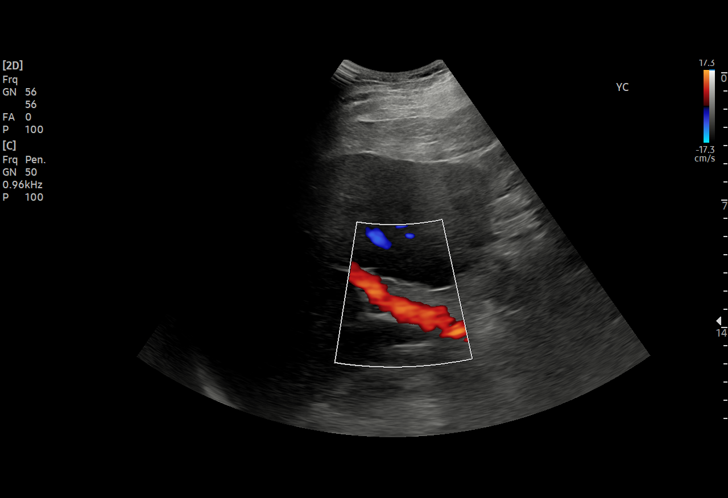
[im 27/46]
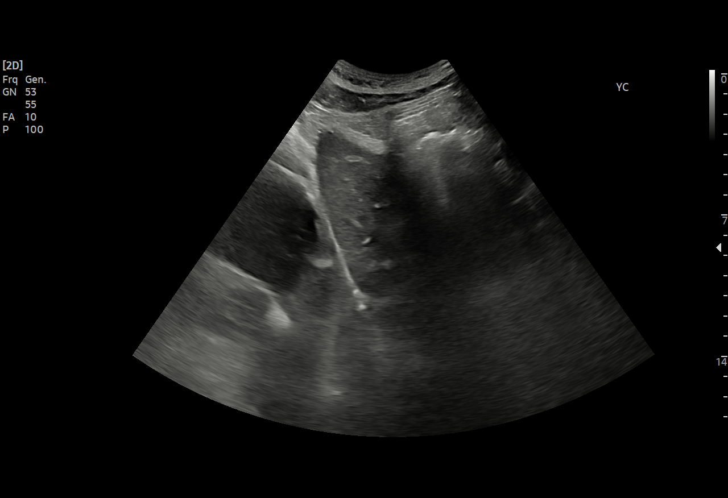
[im 29/46]
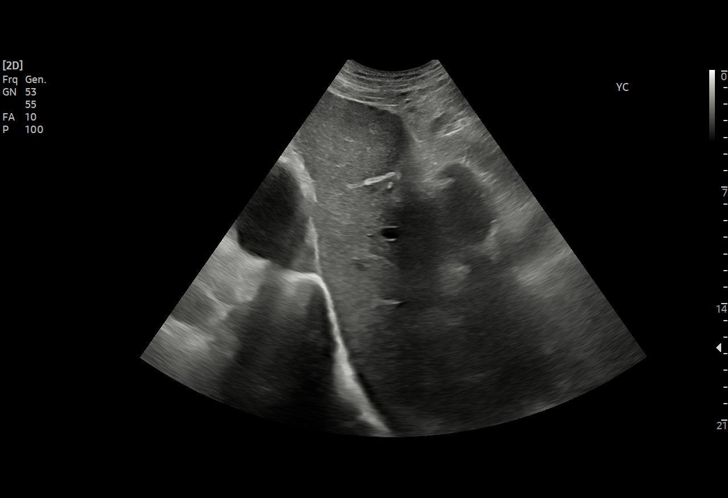
[im 32/46]
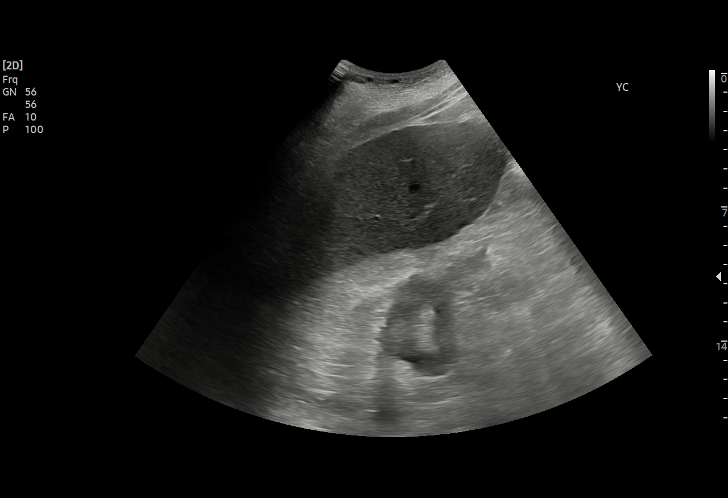
[im 36/46]
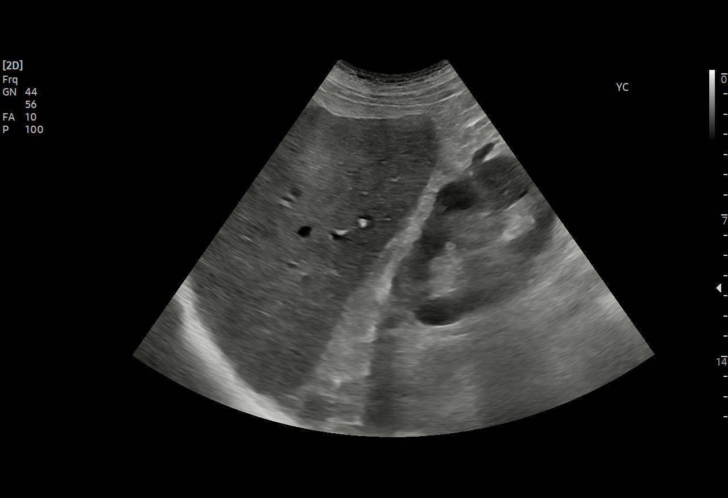
[im 38/46]
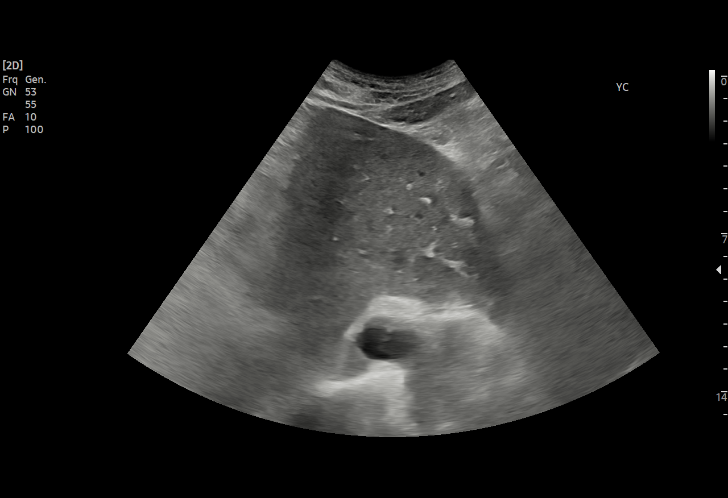
[im 42/46]
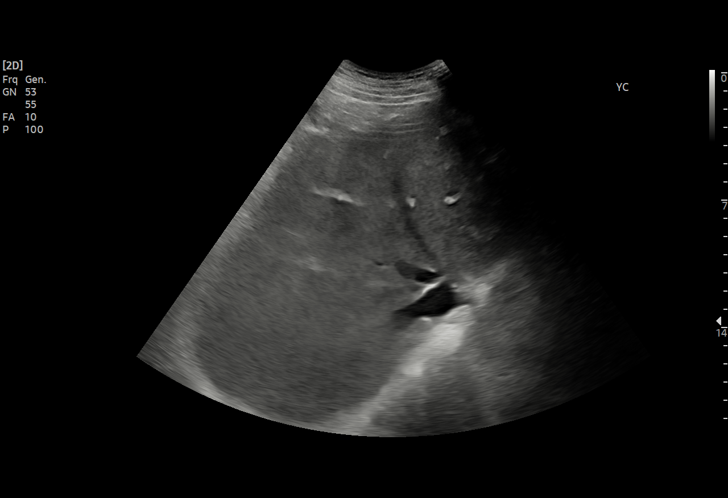
[im 46/46]
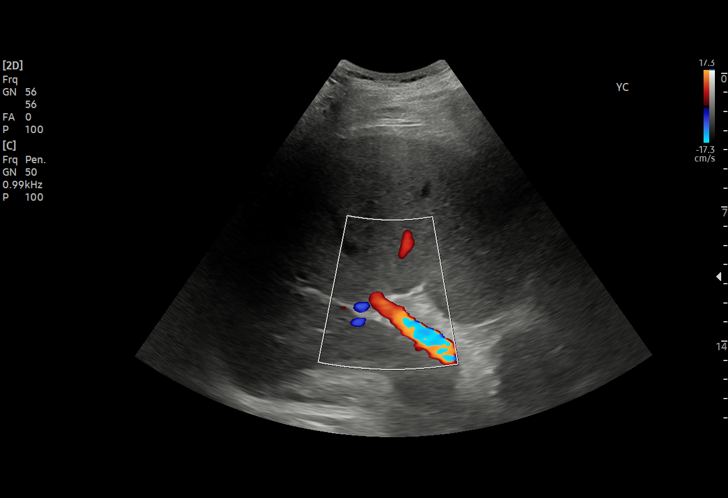

[15 of 25 positions shown; findings below may reference images not displayed]

FINDINGS: Gallbladder:

Gallbladder is well distended with multiple gallstones within.
Negative sonographic Murphy's sign is elicited. No pericholecystic
fluid or wall thickening is noted.

Common bile duct:

Diameter: 3.4 mm.

Liver:

No focal lesion identified. Within normal limits in parenchymal
echogenicity. Portal vein is patent on color Doppler imaging with
normal direction of blood flow towards the liver.

Other: None.
IMPRESSION: Cholelithiasis without complicating factors.

## 2021-08-08 IMAGING — DX DG FOOT COMPLETE 3+V*L*
4 series · 4 of 4 positions shown · non-contrast
Comparison: MRI right toe [DATE].  Right toe x-ray [DATE].

CLINICAL DATA: Ulcer at the base of the first toe.

EXAM:
LEFT FOOT - COMPLETE 3+ VIEW

[foot ap]
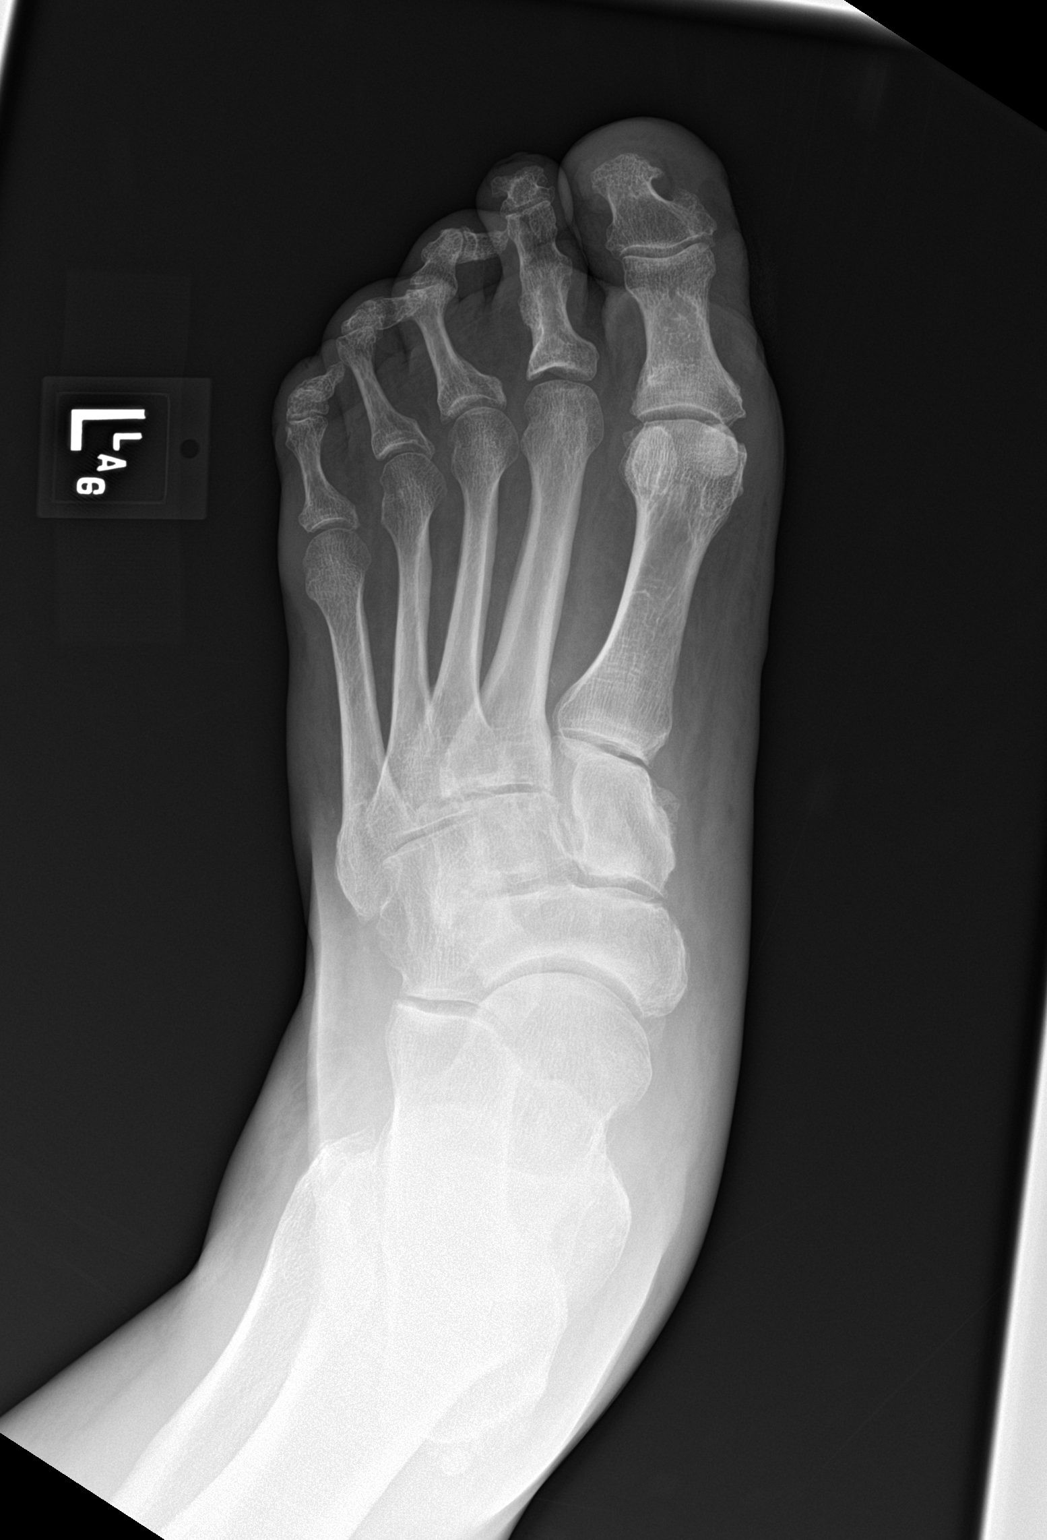

[foot obl (1 of 2)]
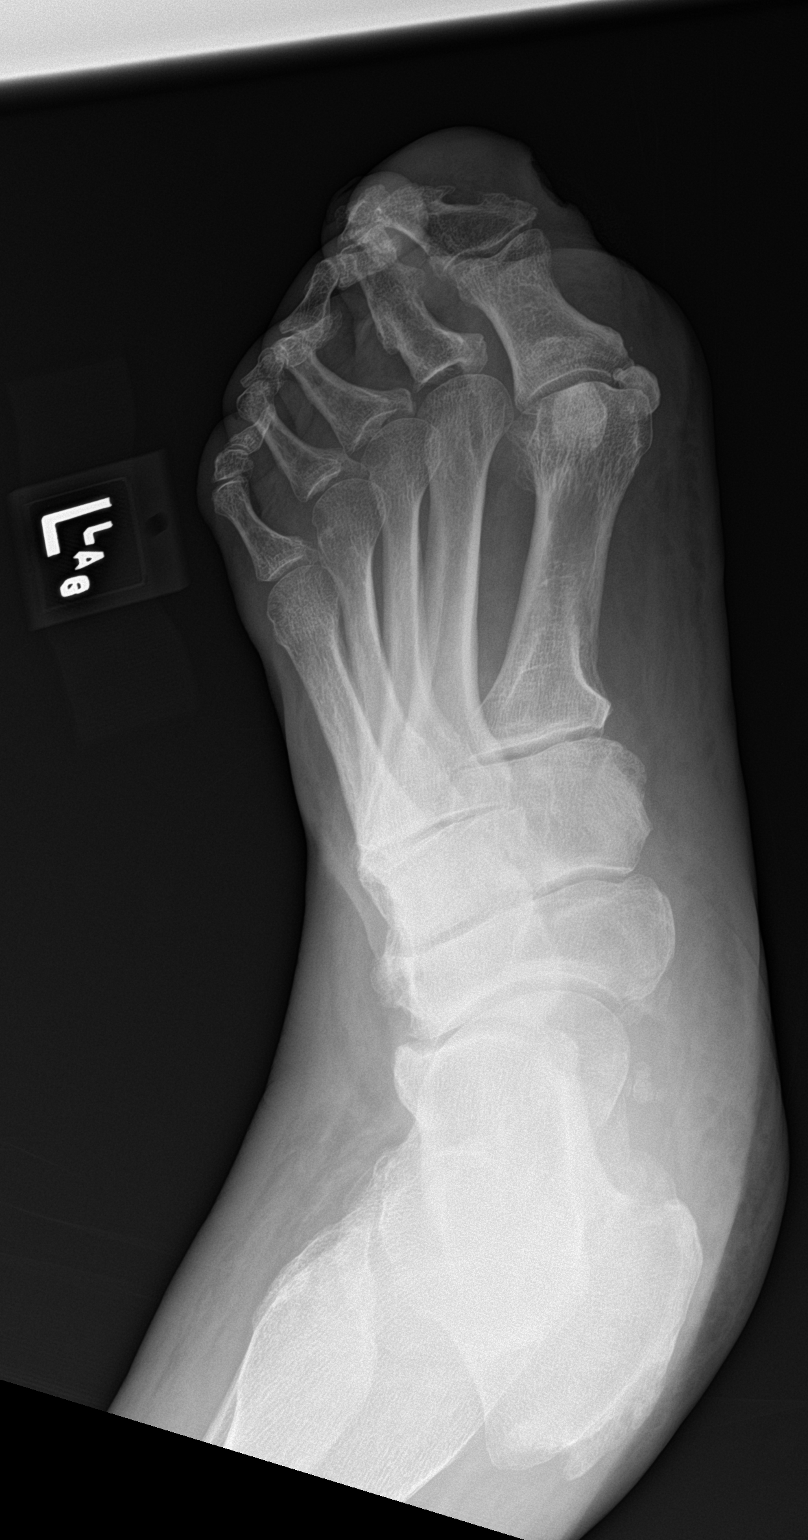

[foot lat]
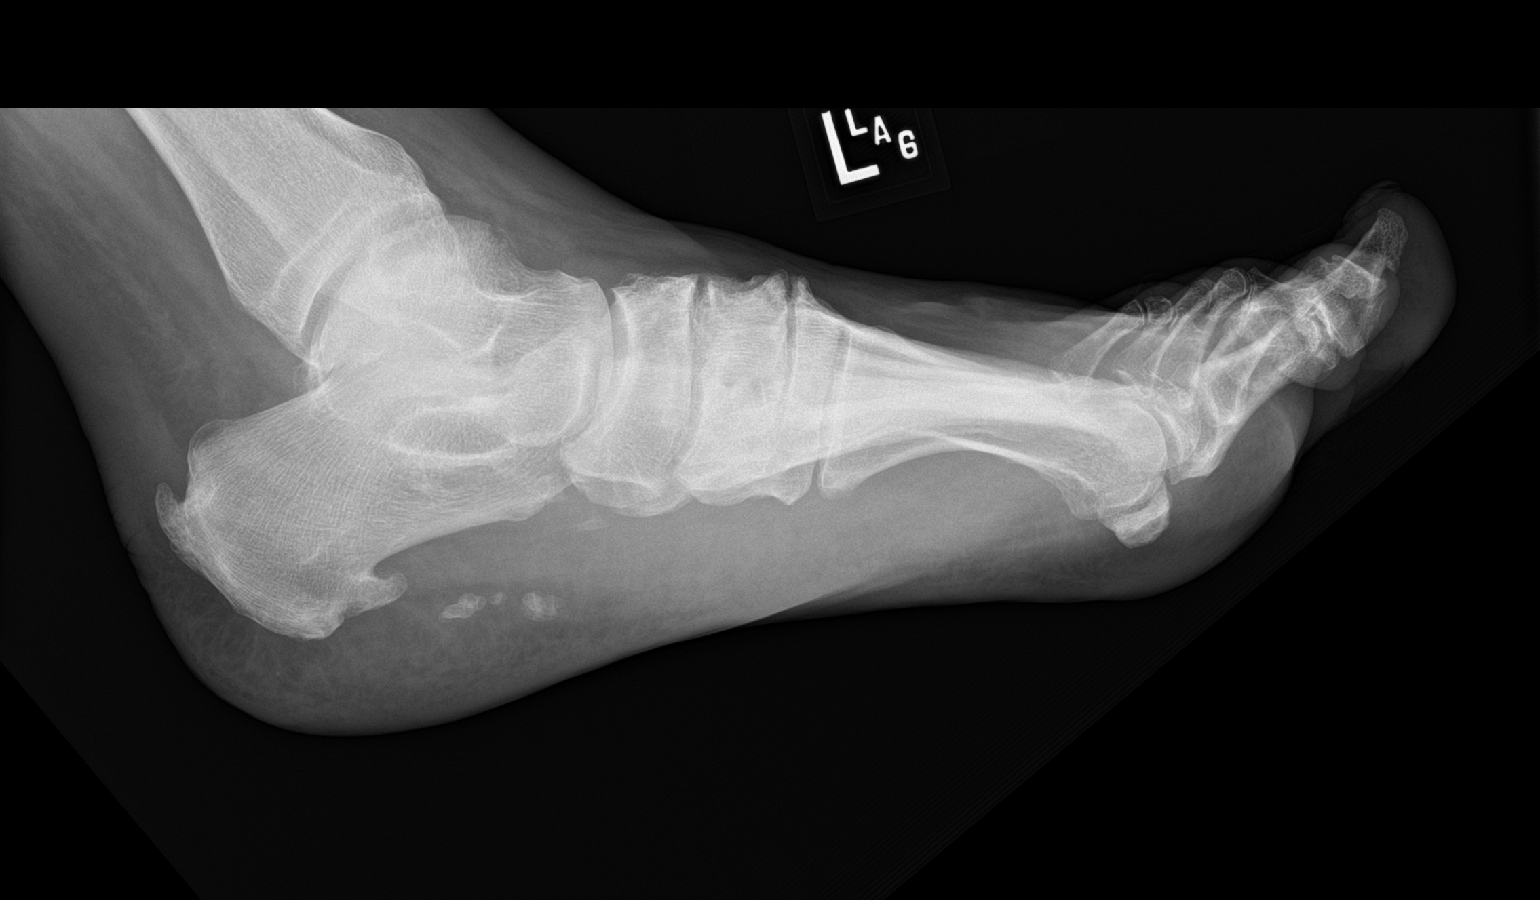

[foot obl (2 of 2)]
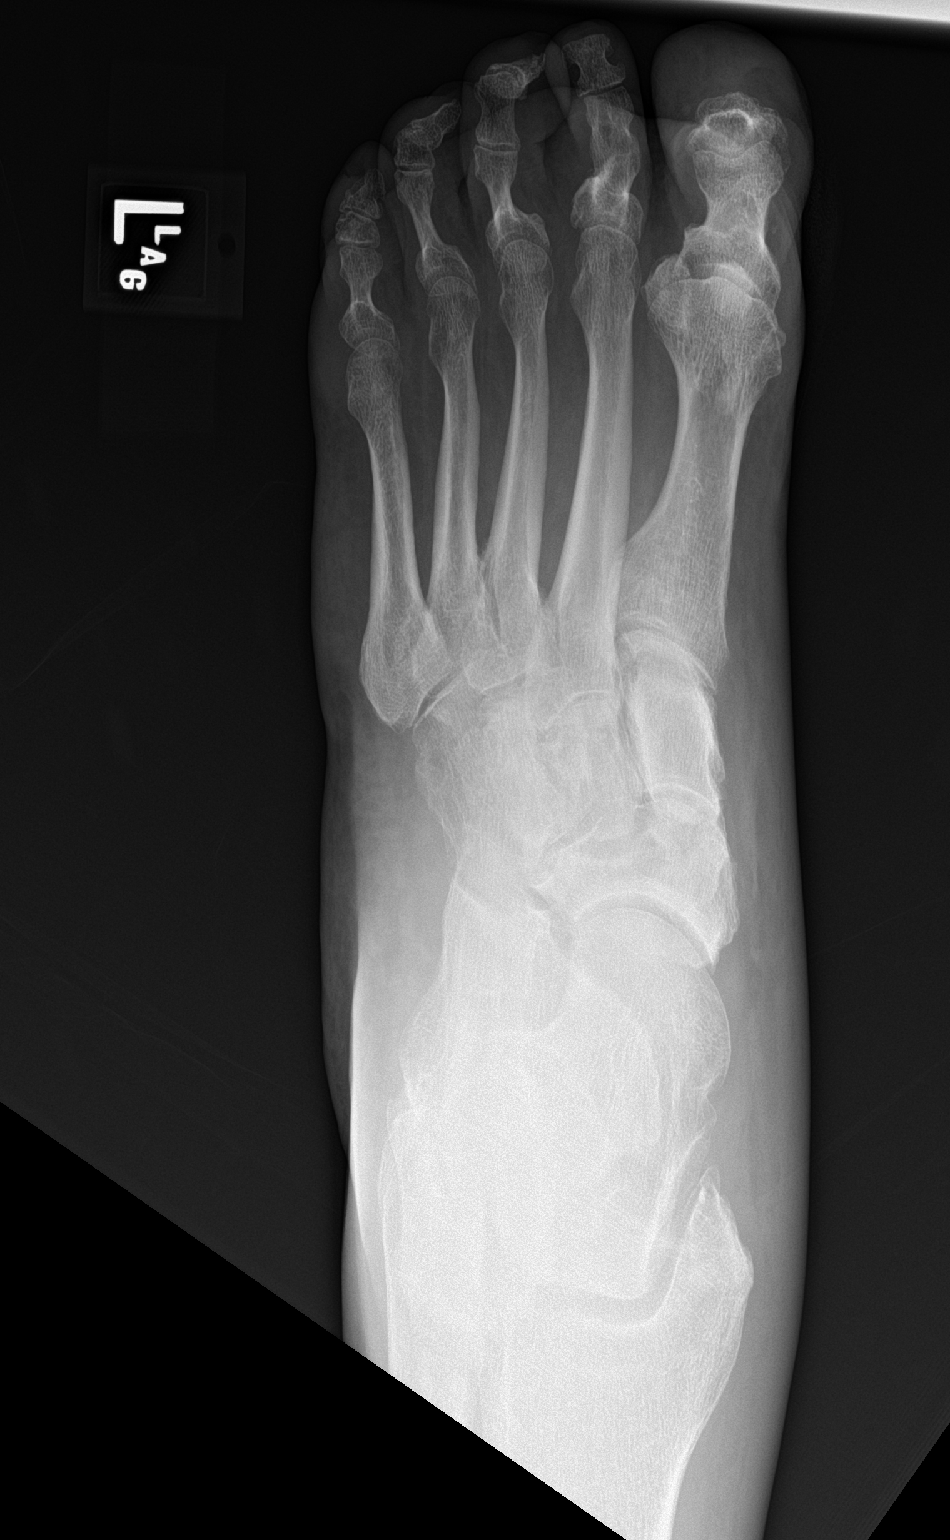

[4 of 4 positions shown; findings below may reference images not displayed]

FINDINGS: There is soft tissue swelling of the first toe. There is no
radiopaque foreign body. There is no underlying cortical erosion.
There is no acute fracture or dislocation.

Bony fusion is identified across the second proximal interphalangeal
joint. Plantar and posterior calcaneal spurs are present. There are
some nonspecific soft tissue calcifications along the plantar
surface of the calcaneus. There is also degenerative spurring of the
dorsal midfoot.
IMPRESSION: 1. Soft tissue swelling of the left first toe.
2. No acute bony abnormality.
3. Degenerative changes of the left foot.

## 2021-08-08 MED ORDER — BUPROPION HCL ER (XL) 300 MG PO TB24
300.0000 mg | ORAL_TABLET | Freq: Every morning | ORAL | Status: DC
  Administered 2021-08-09 – 2021-08-11 (×3): 300 mg via ORAL
  Filled 2021-08-08 (×3): qty 1

## 2021-08-08 MED ORDER — OLMESARTAN MEDOXOMIL-HCTZ 40-25 MG PO TABS: 1.0000 | ORAL_TABLET | Freq: Every morning | ORAL | Status: DC

## 2021-08-08 MED ORDER — ENOXAPARIN SODIUM 40 MG/0.4ML IJ SOSY: 40.0000 mg | PREFILLED_SYRINGE | INTRAMUSCULAR | Status: DC

## 2021-08-08 MED ORDER — OMEGA-3 FATTY ACIDS 1000 MG PO CAPS: 1.0000 g | ORAL_CAPSULE | Freq: Every day | ORAL | Status: DC

## 2021-08-08 MED ORDER — METOPROLOL TARTRATE 50 MG PO TABS
100.0000 mg | ORAL_TABLET | Freq: Two times a day (BID) | ORAL | Status: DC
  Administered 2021-08-08 – 2021-08-21 (×26): 100 mg via ORAL
  Filled 2021-08-08 (×26): qty 2

## 2021-08-08 MED ORDER — PANTOPRAZOLE SODIUM 40 MG PO TBEC
40.0000 mg | DELAYED_RELEASE_TABLET | Freq: Every day | ORAL | Status: DC
  Administered 2021-08-09 – 2021-08-21 (×12): 40 mg via ORAL
  Filled 2021-08-08 (×12): qty 1

## 2021-08-08 MED ORDER — AMLODIPINE BESYLATE 10 MG PO TABS
10.0000 mg | ORAL_TABLET | Freq: Every day | ORAL | Status: DC
  Administered 2021-08-08 – 2021-08-20 (×13): 10 mg via ORAL
  Filled 2021-08-08 (×13): qty 1

## 2021-08-08 MED ORDER — POTASSIUM CHLORIDE CRYS ER 20 MEQ PO TBCR
40.0000 meq | EXTENDED_RELEASE_TABLET | Freq: Every morning | ORAL | Status: DC
  Administered 2021-08-09 – 2021-08-10 (×2): 40 meq via ORAL
  Filled 2021-08-08 (×2): qty 2

## 2021-08-08 MED ORDER — HYDROCHLOROTHIAZIDE 25 MG PO TABS
25.0000 mg | ORAL_TABLET | Freq: Every day | ORAL | Status: DC
  Administered 2021-08-09 – 2021-08-10 (×2): 25 mg via ORAL
  Filled 2021-08-08 (×2): qty 1

## 2021-08-08 MED ORDER — INSULIN ASPART 100 UNIT/ML IJ SOLN
0.0000 [IU] | Freq: Three times a day (TID) | INTRAMUSCULAR | Status: DC
Start: 2021-08-09 — End: 2021-08-21
  Administered 2021-08-09: 3 [IU] via SUBCUTANEOUS
  Administered 2021-08-09: 5 [IU] via SUBCUTANEOUS
  Administered 2021-08-10: 8 [IU] via SUBCUTANEOUS
  Administered 2021-08-10: 11 [IU] via SUBCUTANEOUS
  Administered 2021-08-10 – 2021-08-11 (×2): 3 [IU] via SUBCUTANEOUS
  Administered 2021-08-11: 11 [IU] via SUBCUTANEOUS
  Administered 2021-08-11 – 2021-08-12 (×3): 8 [IU] via SUBCUTANEOUS
  Administered 2021-08-12: 5 [IU] via SUBCUTANEOUS
  Administered 2021-08-13: 3 [IU] via SUBCUTANEOUS
  Administered 2021-08-13: 5 [IU] via SUBCUTANEOUS
  Administered 2021-08-14: 8 [IU] via SUBCUTANEOUS
  Administered 2021-08-14: 3 [IU] via SUBCUTANEOUS
  Administered 2021-08-14: 5 [IU] via SUBCUTANEOUS
  Administered 2021-08-15 – 2021-08-16 (×5): 3 [IU] via SUBCUTANEOUS
  Administered 2021-08-17: 5 [IU] via SUBCUTANEOUS
  Administered 2021-08-17: 2 [IU] via SUBCUTANEOUS
  Administered 2021-08-18 (×3): 3 [IU] via SUBCUTANEOUS
  Administered 2021-08-19 (×3): 2 [IU] via SUBCUTANEOUS
  Administered 2021-08-20: 8 [IU] via SUBCUTANEOUS
  Administered 2021-08-20: 3 [IU] via SUBCUTANEOUS
  Administered 2021-08-20: 2 [IU] via SUBCUTANEOUS

## 2021-08-08 MED ORDER — INSULIN ASPART 100 UNIT/ML IJ SOLN
0.0000 [IU] | Freq: Every day | INTRAMUSCULAR | Status: DC
Start: 2021-08-08 — End: 2021-08-21
  Administered 2021-08-09: 3 [IU] via SUBCUTANEOUS
  Administered 2021-08-12 – 2021-08-13 (×2): 2 [IU] via SUBCUTANEOUS

## 2021-08-08 MED ORDER — OXYCODONE HCL 5 MG PO TABS
5.0000 mg | ORAL_TABLET | ORAL | Status: DC | PRN
  Administered 2021-08-08 – 2021-08-21 (×35): 5 mg via ORAL
  Filled 2021-08-08 (×35): qty 1

## 2021-08-08 MED ORDER — IRBESARTAN 300 MG PO TABS
300.0000 mg | ORAL_TABLET | Freq: Every day | ORAL | Status: DC
  Administered 2021-08-09 – 2021-08-15 (×7): 300 mg via ORAL
  Filled 2021-08-08 (×7): qty 1

## 2021-08-08 MED ORDER — OMEGA-3-ACID ETHYL ESTERS 1 G PO CAPS
1.0000 g | ORAL_CAPSULE | Freq: Every day | ORAL | Status: DC
  Administered 2021-08-09 – 2021-08-21 (×13): 1 g via ORAL
  Filled 2021-08-08 (×12): qty 1

## 2021-08-08 MED ORDER — ASPIRIN 325 MG PO TABS
325.0000 mg | ORAL_TABLET | Freq: Every day | ORAL | Status: DC
  Administered 2021-08-10 – 2021-08-21 (×12): 325 mg via ORAL
  Filled 2021-08-08 (×12): qty 1

## 2021-08-08 MED ORDER — TRAMADOL HCL 50 MG PO TABS
50.0000 mg | ORAL_TABLET | Freq: Three times a day (TID) | ORAL | Status: DC | PRN
  Administered 2021-08-09 – 2021-08-19 (×9): 50 mg via ORAL
  Filled 2021-08-08 (×10): qty 1

## 2021-08-08 MED ORDER — CALCIUM CARBONATE ANTACID 500 MG PO CHEW
400.0000 mg | CHEWABLE_TABLET | Freq: Two times a day (BID) | ORAL | Status: DC
  Administered 2021-08-08 – 2021-08-21 (×26): 400 mg via ORAL
  Filled 2021-08-08 (×26): qty 2

## 2021-08-08 MED ORDER — ALPRAZOLAM 0.5 MG PO TABS: 0.5000 mg | ORAL_TABLET | Freq: Two times a day (BID) | ORAL | Status: DC | PRN

## 2021-08-08 NOTE — Consult Note (Signed)
WOC Nurse Consult Note: Reason for Consult:Patient seen in outpatient wound care center today by Dr. Mikey Bussing. Referred for admission due to purulent drainage, erythema, warmth Wound type:neuropathic, infectious Pressure Injury POA: N/A Measurement:See encounter notes by Dr. Mikey Bussing earlier today.  Wound bed: red, moist (See photos taken in ED this afternoon) Drainage (amount, consistency, odor) small to moderate, foul odor (according to Dr. Neita Garnet note) Periwound:erythema to dorsal aspect of foot. Dressing procedure/placement/frequency: Silver hydrofiber topical wound dressings and off loading has been ordered for use while patient is in the community setting, but I will implement an antimicrobial nonadherent for in house use for frequent assessments and atraumatic dressing changes while systemic antibiotic therapy is initiated. Heels are to be floated and a sacral prophylactic foam dressing placed for PI prevention.  Consider orthopedic or vascular evaluation while in house.  Patient to return to the previous POC and to the oversight of Dr. Mikey Bussing post discharge.  WOC nursing team will not follow, but will remain available to this patient, the nursing and medical teams.  Please re-consult if needed. Thanks, Ladona Mow, MSN, RN, GNP, [facility]  Pager# [phone]

## 2021-08-08 NOTE — ED Provider Notes (Signed)
[facility] Provider Note   CSN: [id number] Arrival date & time: 08/08/21  2979     History Chief Complaint  Patient presents with   Wound Infection    [name] [name] is a 65 y.o. male.  HPI    65 year old male comes in with chief complaint of wound infection. Patient has history of diabetes and has chronic leg wounds/toe ulcers.  Patient is being managed by wound care clinic.  Patient was sent to the ER by the wound care team because of worsening infection.  Patient reports over the last 4 days he has had change in the right great toe, with drainage that is foul-smelling and also new erythema.  No fevers, chills.  Past Medical History:  Diagnosis Date   Anxiety    Bulbous urethral stricture    chronic--- hx multiple dilatation's and urolume stent's   CAD (coronary artery disease) 04/ 2011  (previous cardiologist-  dr h. Katrinka Blazing , lov 06/ 2016)  currently followed by pcp   03-03-2010  CABG x 5, LIMA to LAD, left radial artery to OM1,SVG to R1, seqSVG to PDA and PLA   ED (erectile dysfunction)    Essential hypertension, benign    GERD (gastroesophageal reflux disease)    History of pancreatitis 2005   Mixed hyperlipidemia    OSA on CPAP    S/P CABG x 5 03/03/2010   Type II or unspecified type diabetes mellitus without mention of complication, not stated as uncontrolled    endocrinologist-  dr Sharl Ma    Patient Active Problem List   Diagnosis Date Noted   Toe infection 08/08/2021   CAD (coronary artery disease) of artery bypass graft 04/21/2015   Hyperlipidemia 04/21/2015   Essential hypertension 04/21/2015   Type 2 diabetes mellitus (HCC) 04/21/2015   Ulcer of foot (HCC) 08/22/2014    Past Surgical History:  Procedure Laterality Date   CARDIAC CATHETERIZATION  03-01-2010   dr Verdis Prime   nuclear stress test --positive moderate ishemia:  severe multivessel CAD w/ total occlusion of pLAD, HG obstrucion of pPDA with supplies collaterals around the apex  to the LAD, severe stenosis distal RCA, total occlusion OM1, 75% D1, normal LVF    CORONARY ARTERY BYPASS GRAFT  03-03-2010  dr hendrickson    LIMA to LAD, SVG to RI, left radial artery to OM1, seqSVG  to PDA and PLA   CYSTO W/ DILATATION , LASER INCISION OF URETHRAL STRICTURE  02-25-2005   dr Vernie Ammons  [facility]   CYSTO/  DILATION URETHRAL MEATAL STENOSIS AND DILATION BULBOUS STRICTURE  12-21-2004   dr Vonita Moss  [facility]   CYSTO/ RETROGRADE PYELOGRAM/ DILATATION URETHRAL STRICTURE AND PLACEMENT UROLUME STENT  08-04-2011    dr Vernie Ammons  [facility]   CYSTOSCOPY WITH DIRECT VISION INTERNAL URETHROTOMY  07/15/2008   dr Vernie Ammons  [facility]   and UroLume Stent placement   CYSTOSCOPY WITH URETHRAL DILATATION N/A 04/19/2018   Procedure: CYSTOSCOPY WITH BALLOON URETHRAL DILATATION;  Surgeon: Ihor Gully, MD;  Location: [facility];  Service: Urology;  Laterality: N/A;   ORIF TOE FRACTURE Left 05/22/2013   Procedure: OPEN REDUCTION INTERNAL FIXATION (ORIF) LEFT SECOND TOE PROXIMAL PHALANX FRACTURE/LEFT PERCUTANEOUS PINNING;  Surgeon: Toni Arthurs, MD;  Location: [facility] OR;  Service: Orthopedics;  Laterality: Left;   TONSILLECTOMY  child       Family History  Problem Relation Age of Onset   Cancer Mother        lung   Hypertension Father  CAD Father    CVA Brother     Social History   Tobacco Use   Smoking status: Never   Smokeless tobacco: Never  Vaping Use   Vaping Use: Never used  Substance Use Topics   Alcohol use: Yes    Comment: occasional   Drug use: No    Home Medications Prior to Admission medications   Medication Sig Start Date End Date Taking? Authorizing Provider  ALPRAZolam Prudy Feeler) 0.5 MG tablet Take 0.5 mg by mouth 2 (two) times daily as needed for anxiety.   Yes [provider]  amLODipine (NORVASC) 10 MG tablet Take 10 mg by mouth at bedtime. 06/14/20  Yes [provider]  aspirin 325 MG tablet Take 325 mg by mouth daily.   Yes [provider]   atorvastatin (LIPITOR) 80 MG tablet Take 80 mg by mouth every evening.   Yes [provider]  buPROPion (WELLBUTRIN XL) 300 MG 24 hr tablet Take 300 mg by mouth every morning.    Yes [provider]  fenofibrate 160 MG tablet Take 160 mg by mouth every evening. 07/01/14  Yes [provider]  fish oil-omega-3 fatty acids 1000 MG capsule Take 1 g by mouth daily.    Yes [provider]  HUMALOG MIX 50/50 KWIKPEN (50-50) 100 UNIT/ML Kwikpen Inject 40 Units into the skin 3 (three) times daily before meals. 07/19/20  Yes [provider]  metoprolol tartrate (LOPRESSOR) 100 MG tablet Take 100 mg by mouth 2 (two) times daily.   Yes [provider]  olmesartan-hydrochlorothiazide (BENICAR HCT) 40-25 MG per tablet Take 1 tablet by mouth every morning.    Yes [provider]  omeprazole (PRILOSEC) 20 MG capsule Take 20 mg by mouth every evening.    Yes [provider]  OZEMPIC, 1 MG/DOSE, 2 MG/1.5ML SOPN Inject 1 mg into the skin once a week. Friday 03/12/20  Yes [provider]  potassium chloride SA (K-DUR,KLOR-CON) 20 MEQ tablet Take 40 mEq by mouth every morning.   Yes [provider]  sildenafil (VIAGRA) 100 MG tablet Take 100 mg by mouth daily as needed for erectile dysfunction.   Yes [provider]  NOVOFINE 32G X 6 MM MISC  07/01/14   [provider]    Allergies    Patient has no known allergies.  Review of Systems   Review of Systems  Constitutional:  Positive for activity change.  Skin:  Positive for rash.  Allergic/Immunologic: Positive for immunocompromised state.  Hematological:  Bruises/bleeds easily.  All other systems reviewed and are negative.  Physical Exam Updated Vital Signs BP (!) 147/65 (BP Location: Right Arm)   Pulse 78   Temp 99.1 F (37.3 C) (Oral)   Resp 18   Wt 112.9 kg   SpO2 100%   BMI 32.85 kg/m   Physical Exam Vitals and nursing note reviewed.   Constitutional:      Appearance: He is well-developed.  HENT:     Head: Atraumatic.  Cardiovascular:     Rate and Rhythm: Normal rate.  Pulmonary:     Effort: Pulmonary effort is normal.  Musculoskeletal:     Cervical back: Neck supple.  Skin:    General: Skin is warm.     Findings: Bruising and erythema present.  Neurological:     Mental Status: He is alert and oriented to person, place, and time.         ED Results / Procedures / Treatments   Labs (  all labs ordered are listed, but only abnormal results are displayed) Labs Reviewed  COMPREHENSIVE METABOLIC PANEL - Abnormal; Notable for the following components:      Result Value   Glucose, Bld 153 (*)    BUN 35 (*)    Creatinine, Ser 1.63 (*)    Calcium 7.6 (*)    Albumin 3.2 (*)    AST 63 (*)    ALT 88 (*)    GFR, Estimated 47 (*)    All other components within normal limits  CBC WITH DIFFERENTIAL/PLATELET - Abnormal; Notable for the following components:   WBC 19.2 (*)    Neutro Abs 17.0 (*)    Abs Immature Granulocytes 0.35 (*)    All other components within normal limits  SEDIMENTATION RATE - Abnormal; Notable for the following components:   Sed Rate 37 (*)    All other components within normal limits  C-REACTIVE PROTEIN - Abnormal; Notable for the following components:   CRP 13.9 (*)    All other components within normal limits  CBG MONITORING, ED - Abnormal; Notable for the following components:   Glucose-Capillary 111 (*)    All other components within normal limits  RESP PANEL BY RT-PCR (FLU A&B, COVID) ARPGX2  CULTURE, BLOOD (ROUTINE X 2)  CULTURE, BLOOD (ROUTINE X 2)  LACTIC ACID, PLASMA  HIV ANTIBODY (ROUTINE TESTING W REFLEX)  HEPATITIS PANEL, ACUTE  SEDIMENTATION RATE  C-REACTIVE PROTEIN    EKG None  Radiology MRI Right foot without contrast  Result Date: 08/08/2021 CLINICAL DATA:  Right great toe pain. EXAM: MRI OF THE RIGHT FOREFOOT WITHOUT CONTRAST TECHNIQUE: Multiplanar,  multisequence MR imaging of the right forefoot was performed. No intravenous contrast was administered. COMPARISON:  Right great toe x-rays from same day. Right foot x-rays dated July 13, 2021. FINDINGS: Bones/Joint/Cartilage Abnormal marrow edema with corresponding decreased T1 marrow signal involving the first proximal and distal phalanges. Similar signal abnormality involving the second distal phalanx. Pathologic fracture of the base of the first distal phalanx. No dislocation. Mild hallux valgus deformity and first MTP joint osteoarthritis. Large first IP joint effusion. Ligaments The first IP joint collateral ligaments are not visualized. Muscles and Tendons The flexor pollicis longus tendon is completely torn and retracted to the mid first metatarsal (series 9, image 23). Associated tenosynovitis. Increased T2 signal within and atrophy of the intrinsic muscles of the forefoot, nonspecific, but likely related to diabetic muscle changes. Soft tissue Diffuse soft tissue swelling. Ulceration at the plantar aspect of the first IP joint communicating with the joint space (series 9, image 23). 1.8 x 0.9 x 3.5 cm curvilinear gas and fluid collection along the dorsal and lateral aspect of the great toe, consistent with abscess (series 6, image 18). 1.8 x 1.1 x 1.1 cm fluid collection between the first and second metatarsals (series 7, image 15). Additional multi loculated fluid collection in the superficial dorsal foot overlying the second and third mid metatarsals, measuring 2.7 x 1.8 x 0.7 cm (series 9, image 13). Soft tissue ulceration at the tip of the second toe extending to bone. IMPRESSION: 1. Ulceration at the plantar aspect of the first IP joint communicating with the joint space with underlying first IP joint septic arthritis and osteomyelitis of the first proximal and distal phalanges. Pathologic fracture of the base of the first distal phalanx. 2. Ulceration at the tip of the second toe with underlying  osteomyelitis of the second distal phalanx. 3. Complete tear of the flexor pollicis longus tendon with  retraction to the mid first metatarsal. Associated infectious tenosynovitis. 4. Multiple forefoot abscesses as described above, the largest a curvilinear abscess involving the dorsal and lateral aspect of the great toe, measuring up to 3.5 cm. Electronically Signed   By: Obie Dredge M.D.   On: 08/08/2021 17:37   DG Toe Great Right  Result Date: 08/08/2021 CLINICAL DATA:  First toe pain and underlying infection EXAM: RIGHT GREAT TOE COMPARISON:  07/13/2021 FINDINGS: There is now seen considerable lucency within the base of the first distal phalanx as well as fragmentation. Soft tissue wound is again identified. Soft tissue wound is also noted at the tip of the second distal phalanx with some suspicious findings of bony erosion. These findings are both consistent with osteomyelitis. The lucency seen previously at the medial base of the first proximal phalanx is again seen and stable. Hallux valgus deformity is again noted. IMPRESSION: Increased lucency and fragmentation at the base of the first distal phalanx consistent with osteomyelitis. Overlying soft tissue wound is again seen. A wound is noted at the tip of the second toe as well as some bony erosive changes in the tip of the distal phalanx suspicious for osteomyelitis. Electronically Signed   By: Alcide Clever M.D.   On: 08/08/2021 15:58    Procedures Procedures   Medications Ordered in ED Medications - No data to display  ED Course  I have reviewed the triage vital signs and the nursing notes.  Pertinent labs & imaging results that were available during my care of the patient were reviewed by me and considered in my medical decision making (see chart for details).    MDM Rules/Calculators/A&P                           65 year old male comes in with chief complaint of worsening leg wound and erythema.  He has history of diabetes and  complex wounds.  It appears that he is clinically having cellulitis. Possibly has osteomyelitis based on the wound care note that I have reviewed. I also reviewed patient's previous wound care encounter and at one point he was on Cipro.  5:40 PM Labs do not indicate sepsis clinically.  He does have elevated white count.  Given that there is concerns for osteomyelitis in the setting of patient not being septic, will not start antibiotics in the ED.  Admitting team has been made aware of this decision.  Final Clinical Impression(s) / ED Diagnoses Final diagnoses:  Cellulitis of right lower extremity    Rx / DC Orders ED Discharge Orders     None        Derwood Kaplan, MD 08/08/21 1741

## 2021-08-08 NOTE — ED Triage Notes (Signed)
Pt presents with c/o great right toe infection. Pt was sent from the wound center for IV antibiotics and a CT scan r/t the infection. Pt is diabetic.

## 2021-08-08 NOTE — Progress Notes (Addendum)
ALYX, GEE ([id number]) Visit Report for 08/08/2021 Chief Complaint Document Details Patient Name: Date of Service: [name] [name], [name] [name] 08/08/2021 8:30 A M Medical Record Number: [id number] Patient Account Number: [id number] Date of Birth/Sex: Treating RN: September 06, 1956 (65 y.o. [name] Primary Care Provider: Tally Joe Other Clinician: Referring Provider: Treating Provider/Extender: Andrey Campanile in Treatment: 12 Information Obtained from: Patient Chief Complaint Bilateral toe wounds Electronic Signature(s) Signed: 08/08/2021 12:41:49 PM By: Geralyn Corwin DO Entered By: Geralyn Corwin on 08/08/2021 11:23:56 -------------------------------------------------------------------------------- HPI Details Patient Name: Date of Service: [name] [name] 08/08/2021 8:30 A M Medical Record Number: [id number] Patient Account Number: [id number] Date of Birth/Sex: Treating RN: [birth year] (65 y.o. [name] Primary Care Provider: Tally Joe Other Clinician: Referring Provider: Treating Provider/Extender: Andrey Campanile in Treatment: 12 History of Present Illness HPI Description: Admission 6/27 [name] [name] is a 65 year old male with a past medical history of insulin-dependent type 2 diabetes, CABG in 2011 and essential hypertension that presents to the clinic for bilateral toe wounds. He has wounds to his left great toe that started in May 2022. He also has wounds to his right great toe and second toe that started in September 2021. He noticed they started as blisters and eventually worsened. He is currently using antibiotic ointment and keeping them covered. He reports minimal pain but has decreased sensation to his feet bilaterally. He denies signs of infection. 7/5; patient presents for 1 week follow-up. He did not receive supplies from our wound supplier [facility]. He ordered some silver alginate on [employer] and has  been using this to the wound beds. He has also been aggressively offloading the areas. Overall he reports doing well and would like to hold off on the total contact cast since there has been improvement to the wound beds. He currently denies signs of infection. 7/11; patient presents for 1 week follow-up. He has been using silver alginate to the wound beds but taking his dressing off at night. He would like to continue using his offloading shoe and hold off on the cast for now. He currently denies signs of infection. 7/18; patient presents for 1 week follow-up. He has been using silver alginate to the wound beds. He has no issues or complaints today. Been using his offloading shoe to the right foot. He denies signs of infection. 7/25; patient presents for 1 week follow-up. He continues to use silver alginate to the wound beds. He was mowing his lawn on Saturday And was on his feet most of the day. He has been using his offloading shoe to the left foot. He denies signs of infection.He has not been able to obtain x-rays yet of his feet. 8/29; Patient presents for follow-up. Its been 1 month since the patient was last seen. He states that he has been busy with work and is trying to retire. 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 He has been on his feet more because of this. He was able to obtain his bilateral feet x-rays recently. He currently denies systemic signs of infection. He reports using silver alginate for dressing changes. 9/19; patient presents as an add-on today. He missed his appointment 2 weeks ago. He has been using silver alginate to the wound beds. He states that 5 days ago his right foot wounds have become worse. He reports increased redness to the area and odor. He also reports increased redness to the left leg. He has not been  evaluated for this issue. He feels fine overall. Electronic Signature(s) Signed: 08/08/2021 12:41:49 PM By: Geralyn Corwin DO Entered By: Geralyn Corwin on 08/08/2021 11:25:40 -------------------------------------------------------------------------------- Physical Exam Details Patient Name: Date of Service: [name] [name], [name] [name] 08/08/2021 8:30 A M Medical Record Number: [id number] Patient Account Number: [id number] Date of Birth/Sex: Treating RN: 05-15-1956 (65 y.o. [name] Primary Care Provider: Tally Joe Other Clinician: Referring Provider: Treating Provider/Extender: Andrey Campanile in Treatment: 12 Constitutional respirations regular, non-labored and within target range for patient.. Cardiovascular 2+ dorsalis pedis/posterior tibialis pulses. Psychiatric pleasant and cooperative. Notes Right foot: Increased warmth and erythema to the plantar aspect. T the great toe there is an open wound that communicates with another wound that is new. o There is increased odor and dark brown drainage. T the second toe there is an open wound with nonviable tissue callus and granulation tissue present. o Left foot: Great toe has an open wound with nonviable tissue, granulation tissue and callus present. Left leg has slight erythema to it. Electronic Signature(s) Signed: 08/08/2021 12:41:49 PM By: Geralyn Corwin DO Entered By: Geralyn Corwin on 08/08/2021 11:27:17 -------------------------------------------------------------------------------- Physician Orders Details Patient Name: Date of Service: [name] [name] 08/08/2021 8:30 A M Medical Record Number: [id number] Patient Account Number: [id number] Date of Birth/Sex: Treating RN: 19-Jul-1956 (65 y.o. [name] [name], [name] [name] Primary Care Provider: Tally Joe Other Clinician: Referring Provider: Treating Provider/Extender: Andrey Campanile in Treatment: 12 Verbal / Phone Orders: No Diagnosis Coding ICD-10 Coding Code Description [phone] 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 L97.519  Non-pressure chronic ulcer of other part of right foot with unspecified severity E11.621 Type 2 diabetes mellitus with foot ulcer E11.40 Type 2 diabetes mellitus with diabetic neuropathy, unspecified Follow-up Appointments ppointment in 1 week. - Dr. Mikey Bussing Return A Other: - [employer] for Supplies Bathing/ Shower/ Hygiene May shower and wash wound with soap and water. Edema Control - Lymphedema / SCD / Other Avoid standing for long periods of time. Off-Loading Wedge shoe to: - front offloader on right foot Additional Orders / Instructions Follow Nutritious Diet - -Increase protein, 100-120g daily -Keep blood sugars monitored Wound Treatment Wound #2 - T Great oe Wound Laterality: Left Cleanser: Soap and Water 1 x Per Day/15 Days Discharge Instructions: May shower and wash wound with dial antibacterial soap and water prior to dressing change. Prim Dressing: KerraCel Ag Gelling Fiber Dressing, 2x2 in (silver alginate) 1 x Per Day/15 Days ary Discharge Instructions: Apply silver alginate to wound bed as instructed Secondary Dressing: Woven Gauze Sponges 2x2 in 1 x Per Day/15 Days Discharge Instructions: Apply over primary dressing as directed. Secondary Dressing: Optifoam Non-Adhesive Dressing, 4x4 in 1 x Per Day/15 Days Discharge Instructions: Apply as donut over dressing Secured With: Conforming Stretch Gauze Bandage, Sterile 2x75 (in/in) 1 x Per Day/15 Days Discharge Instructions: Secure with stretch gauze as directed. Secured With: Transpore Surgical Tape, 2x10 (in/yd) 1 x Per Day/15 Days Discharge Instructions: Secure dressing with tape as directed. Wound #3 - T Great oe Wound Laterality: Right Cleanser: Soap and Water 1 x Per Day/15 Days Discharge Instructions: May shower and wash wound with dial antibacterial soap and water prior to dressing change. Prim Dressing: KerraCel Ag Gelling Fiber Dressing, 2x2 in (silver alginate) 1 x Per Day/15 Days ary Discharge  Instructions: Apply silver alginate to wound bed as instructed Secondary Dressing: Woven Gauze Sponges 2x2 in 1 x Per Day/15 Days Discharge Instructions: Apply over  primary dressing as directed. Secondary Dressing: Optifoam Non-Adhesive Dressing, 4x4 in 1 x Per Day/15 Days Discharge Instructions: Apply as donut over dressing Secured With: Conforming Stretch Gauze Bandage, Sterile 2x75 (in/in) 1 x Per Day/15 Days Discharge Instructions: Secure with stretch gauze as directed. Secured With: Transpore Surgical Tape, 2x10 (in/yd) 1 x Per Day/15 Days Discharge Instructions: Secure dressing with tape as directed. Wound #4 - T Second oe Wound Laterality: Right Cleanser: Soap and Water 1 x Per Day/15 Days Discharge Instructions: May shower and wash wound with dial antibacterial soap and water prior to dressing change. Prim Dressing: KerraCel Ag Gelling Fiber Dressing, 2x2 in (silver alginate) 1 x Per Day/15 Days ary Discharge Instructions: Apply silver alginate to wound bed as instructed Secondary Dressing: Woven Gauze Sponges 2x2 in 1 x Per Day/15 Days Discharge Instructions: Apply over primary dressing as directed. Secondary Dressing: Optifoam Non-Adhesive Dressing, 4x4 in 1 x Per Day/15 Days Discharge Instructions: Apply as donut over dressing Secured With: Conforming Stretch Gauze Bandage, Sterile 2x75 (in/in) 1 x Per Day/15 Days Discharge Instructions: Secure with stretch gauze as directed. Secured With: Transpore Surgical Tape, 2x10 (in/yd) 1 x Per Day/15 Days Discharge Instructions: Secure dressing with tape as directed. Patient Medications llergies: No Known Allergies A Notifications Medication Indication Start End 08/08/2021 clindamycin HCl DOSE 1 - oral 300 mg capsule - 1 capsule oral QID for 10 days Notes Dr. Mikey Bussing recommends pt. go to ED ASAP for right great toe infection/possible osteomyelitis/ and IV antibiotics. CT scan and oral antibiotics ordered, but Dr. Mikey Bussing recommends  not waiting for CT scan to be scheduled and going ahead going to ED due to Right Great T presentation today in clinic. T is red, warm, has purulent drainage, and foul odor. oe oe Electronic Signature(s) Signed: 08/08/2021 12:41:49 PM By: Geralyn Corwin DO Previous Signature: 08/08/2021 9:34:53 AM Version By: Geralyn Corwin DO Entered By: Geralyn Corwin on 08/08/2021 11:27:42 -------------------------------------------------------------------------------- Problem List Details Patient Name: Date of Service: [name] [name]. 08/08/2021 8:30 A M Medical Record Number: [id number] Patient Account Number: [id number] Date of Birth/Sex: Treating RN: 20-Jun-1956 (65 y.o. [name] Primary Care Provider: Tally Joe Other Clinician: Referring Provider: Treating Provider/Extender: Andrey Campanile in Treatment: 12 Active Problems ICD-10 Encounter Code Description Active Date MDM Diagnosis L97.529 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 05/16/2021 No Yes L97.519 Non-pressure chronic ulcer of other part of right foot with unspecified severity 05/16/2021 No Yes E11.621 Type 2 diabetes mellitus with foot ulcer 05/17/2021 No Yes E11.40 Type 2 diabetes mellitus with diabetic neuropathy, unspecified 05/17/2021 No Yes Inactive Problems Resolved Problems Electronic Signature(s) Signed: 08/08/2021 12:41:49 PM By: Geralyn Corwin DO Entered By: Geralyn Corwin on 08/08/2021 11:23:32 -------------------------------------------------------------------------------- Progress Note Details Patient Name: Date of Service: [name] [name] 08/08/2021 8:30 A M Medical Record Number: [id number] Patient Account Number: [id number] Date of Birth/Sex: Treating RN: 17-Jul-1956 (65 y.o. [name] Primary Care Provider: Tally Joe Other Clinician: Referring Provider: Treating Provider/Extender: Andrey Campanile  in Treatment: 12 Subjective Chief Complaint Information obtained from Patient Bilateral toe wounds History of Present Illness (HPI) Admission 6/27 [name] [name] is a 65 year old male with a past medical history of insulin-dependent type 2 diabetes, CABG in 2011 and essential hypertension that presents to the clinic for bilateral toe wounds. He has wounds to his left great toe that started in May 2022. He also has wounds to his right great toe and second toe  that started in September 2021. He noticed they started as blisters and eventually worsened. He is currently using antibiotic ointment and keeping them covered. He reports minimal pain but has decreased sensation to his feet bilaterally. He denies signs of infection. 7/5; patient presents for 1 week follow-up. He did not receive supplies from our wound supplier [facility]. He ordered some silver alginate on [employer] and has been using this to the wound beds. He has also been aggressively offloading the areas. Overall he reports doing well and would like to hold off on the total contact cast since there has been improvement to the wound beds. He currently denies signs of infection. 7/11; patient presents for 1 week follow-up. He has been using silver alginate to the wound beds but taking his dressing off at night. He would like to continue using his offloading shoe and hold off on the cast for now. He currently denies signs of infection. 7/18; patient presents for 1 week follow-up. He has been using silver alginate to the wound beds. He has no issues or complaints today. Been using his offloading shoe to the right foot. He denies signs of infection. 7/25; patient presents for 1 week follow-up. He continues to use silver alginate to the wound beds. He was mowing his lawn on Saturday And was on his feet most of the day. He has been using his offloading shoe to the left foot. He denies signs of infection.He has not been able to obtain x-rays yet of his  feet. 8/29; Patient presents for follow-up. Its been 1 month since the patient was last seen. He states that he has been busy with work and is trying to retire. 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 He has been on his feet more because of this. He was able to obtain his bilateral feet x-rays recently. He currently denies systemic signs of infection. He reports using silver alginate for dressing changes. 9/19; patient presents as an add-on today. He missed his appointment 2 weeks ago. He has been using silver alginate to the wound beds. He states that 5 days ago his right foot wounds have become worse. He reports increased redness to the area and odor. He also reports increased redness to the left leg. He has not been evaluated for this issue. He feels fine overall. Patient History Information obtained from Patient. Family History Cancer - Mother, Heart Disease - Father, Hypertension - Father, Stroke - Siblings. Social History Never smoker, Marital Status - Married, Alcohol Use - Rarely - beer, Drug Use - No History, Caffeine Use - Daily. Medical History Eyes Denies history of Cataracts, Glaucoma, Optic Neuritis Hematologic/Lymphatic Denies history of Anemia, Hemophilia, Human Immunodeficiency Virus, Lymphedema, Sickle Cell Disease Respiratory Patient has history of Sleep Apnea - reported ; uses CPAP Denies history of Aspiration, Asthma, Chronic Obstructive Pulmonary Disease (COPD), Pneumothorax, Tuberculosis Cardiovascular Patient has history of Coronary Artery Disease - reported, Hypertension - reported Denies history of Angina, Arrhythmia, Congestive Heart Failure, Deep Vein Thrombosis, Hypotension, Myocardial Infarction, Peripheral Arterial Disease, Peripheral Venous Disease, Phlebitis, Vasculitis Gastrointestinal Denies history of Cirrhosis , Colitis, Crohnoos, Hepatitis A, Hepatitis B, Hepatitis C Endocrine Patient has history of Type II Diabetes - 2009 Denies history  of Type I Diabetes Genitourinary Denies history of End Stage Renal Disease Immunological Denies history of Lupus Erythematosus, Raynaudoos, Scleroderma Integumentary (Skin) Denies history of History of Burn Musculoskeletal Patient has history of Osteoarthritis - knees Denies history of Gout, Rheumatoid Arthritis, Osteomyelitis Neurologic Patient has history  of Neuropathy - minor Denies history of Dementia, Quadriplegia, Paraplegia, Seizure Disorder Oncologic Denies history of Received Chemotherapy, Received Radiation Psychiatric Patient has history of 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 Denies history of Anorexia/bulimia Hospitalization/Surgery History - by pass heart. - urology stent. - ORIF of left 2nd toe. Medical A Surgical History Notes nd Cardiovascular blockages 2011 Endocrine wears monitor in skin that checks CBG constantly Objective Constitutional respirations regular, non-labored and within target range for patient.. Vitals Time Taken: 8:27 AM, Height: 72 in, Weight: 271 lbs, BMI: 36.8, Temperature: 97.9 F, Pulse: 73 bpm, Respiratory Rate: 17 breaths/min, Blood Pressure: 95/62 mmHg, Capillary Blood Glucose: 120 mg/dl. Cardiovascular 2+ dorsalis pedis/posterior tibialis pulses. Psychiatric pleasant and cooperative. General Notes: Right foot: Increased warmth and erythema to the plantar aspect. T the great toe there is an open wound that communicates with another o wound that is new. There is increased odor and dark brown drainage. T the second toe there is an open wound with nonviable tissue callus and granulation o tissue present. Left foot: Great toe has an open wound with nonviable tissue, granulation tissue and callus present. Left leg has slight erythema to it. Integumentary (Hair, Skin) Wound #2 status is Open. Original cause of wound was Gradually Appeared. The date acquired was: 03/20/2021. The wound has been in treatment 12 weeks.  The wound is located on the Left T Great. The wound measures 0.8cm length x 1.2cm width x 0.1cm depth; 0.754cm^2 area and 0.075cm^3 volume. There is Fat oe Layer (Subcutaneous Tissue) exposed. There is no tunneling or undermining noted. There is a medium amount of serosanguineous drainage noted. The wound margin is thickened. There is large (67-100%) red, pink, pale granulation within the wound bed. There is a small (1-33%) amount of necrotic tissue within the wound bed including Adherent Slough. Wound #2 status is Open. Original cause of wound was Gradually Appeared. The date acquired was: 03/20/2021. The wound has been in treatment 12 weeks. The wound is located on the Left T Great. The wound measures 0.8cm length x 1.2cm width x 0.1cm depth; 0.754cm^2 area and 0.075cm^3 volume. There is a oe medium amount of serosanguineous drainage noted. Wound #3 status is Open. Original cause of wound was Gradually Appeared. The date acquired was: 07/21/2020. The wound has been in treatment 12 weeks. The wound is located on the Right T Great. The wound measures 1cm length x 2.1cm width x 1.4cm depth; 1.649cm^2 area and 2.309cm^3 volume. There is Fat oe Layer (Subcutaneous Tissue) exposed. There is no undermining noted, however, there is tunneling at 3:00 with a maximum distance of 1.4cm. There is a medium amount of serosanguineous drainage noted. The wound margin is thickened. There is medium (34-66%) red granulation within the wound bed. There is a medium (34-66%) amount of necrotic tissue within the wound bed including Adherent Slough. Wound #4 status is Open. Original cause of wound was Gradually Appeared. The date acquired was: 05/02/2021. The wound has been in treatment 12 weeks. The wound is located on the Right T Second. The wound measures 0.5cm length x 0.5cm width x 0.3cm depth; 0.196cm^2 area and 0.059cm^3 volume. There oe is Fat Layer (Subcutaneous Tissue) exposed. There is no tunneling or undermining  noted. There is a medium amount of serosanguineous drainage noted. The wound margin is distinct with the outline attached to the wound base. There is large (67-100%) pink, pale granulation within the wound bed. There is no necrotic tissue within the wound bed. Assessment Active Problems ICD-10 Non-pressure  chronic ulcer of other part of left foot with unspecified severity Non-pressure chronic ulcer of other part of right foot with unspecified severity Type 2 diabetes mellitus with foot ulcer Type 2 diabetes mellitus with diabetic neuropathy, unspecified Unfortunately patient has presented sporadically over the last 2 months with no consistent follow-up. His toe wounds have worsened. I did order a CT scan once we had a CMP at last clinic visit. This has not been scheduled yet. At this time due to exam findings of increased warmth and redness to the right foot and increased drainage and odor I recommend that the patient go to the ED T assess his diabetic foot infection. He will likely need IV antibiotics and o imaging. He states he would like to have oral antibiotics to get started in the meantime. I will prescribe clindamycin. Plan Follow-up Appointments: Return Appointment in 1 week. - Dr. Mikey Bussing Other: - [facility] [employer] for Supplies Bathing/ Shower/ Hygiene: May shower and wash wound with soap and water. Edema Control - Lymphedema / SCD / Other: Avoid standing for long periods of time. Off-Loading: Wedge shoe to: - front offloader on right foot Additional Orders / Instructions: Follow Nutritious Diet - -Increase protein, 100-120g daily -Keep blood sugars monitored The following medication(s) was prescribed: clindamycin HCl oral 300 mg capsule 1 1 capsule oral QID for 10 days starting 08/08/2021 General Notes: Dr. Mikey Bussing recommends pt. go to ED ASAP for right great toe infection/possible osteomyelitis/ and IV antibiotics. CT scan and oral antibiotics ordered, but Dr. Mikey Bussing recommends  not waiting for CT scan to be scheduled and going ahead going to ED due to Right Great T presentation oe today in clinic. T is red, warm, has purulent drainage, and foul odor. oe WOUND #2: - T Great oe Wound Laterality: Left Cleanser: Soap and Water 1 x Per Day/15 Days Discharge Instructions: May shower and wash wound with dial antibacterial soap and water prior to dressing change. Prim Dressing: KerraCel Ag Gelling Fiber Dressing, 2x2 in (silver alginate) 1 x Per Day/15 Days ary Discharge Instructions: Apply silver alginate to wound bed as instructed Secondary Dressing: Woven Gauze Sponges 2x2 in 1 x Per Day/15 Days Discharge Instructions: Apply over primary dressing as directed. Secondary Dressing: Optifoam Non-Adhesive Dressing, 4x4 in 1 x Per Day/15 Days Discharge Instructions: Apply as donut over dressing Secured With: Conforming Stretch Gauze Bandage, Sterile 2x75 (in/in) 1 x Per Day/15 Days Discharge Instructions: Secure with stretch gauze as directed. Secured With: Transpore Surgical Tape, 2x10 (in/yd) 1 x Per Day/15 Days Discharge Instructions: Secure dressing with tape as directed. WOUND #3: - T Great Wound Laterality: Right oe Cleanser: Soap and Water 1 x Per Day/15 Days Discharge Instructions: May shower and wash wound with dial antibacterial soap and water prior to dressing change. Prim Dressing: KerraCel Ag Gelling Fiber Dressing, 2x2 in (silver alginate) 1 x Per Day/15 Days ary Discharge Instructions: Apply silver alginate to wound bed as instructed Secondary Dressing: Woven Gauze Sponges 2x2 in 1 x Per Day/15 Days Discharge Instructions: Apply over primary dressing as directed. Secondary Dressing: Optifoam Non-Adhesive Dressing, 4x4 in 1 x Per Day/15 Days Discharge Instructions: Apply as donut over dressing Secured With: Conforming Stretch Gauze Bandage, Sterile 2x75 (in/in) 1 x Per Day/15 Days Discharge Instructions: Secure with stretch gauze as directed. Secured  With: Transpore Surgical Tape, 2x10 (in/yd) 1 x Per Day/15 Days Discharge Instructions: Secure dressing with tape as directed. WOUND #4: - T Second Wound Laterality: Right oe Cleanser: Soap and Water 1 x  Per Day/15 Days Discharge Instructions: May shower and wash wound with dial antibacterial soap and water prior to dressing change. Prim Dressing: KerraCel Ag Gelling Fiber Dressing, 2x2 in (silver alginate) 1 x Per Day/15 Days ary Discharge Instructions: Apply silver alginate to wound bed as instructed Secondary Dressing: Woven Gauze Sponges 2x2 in 1 x Per Day/15 Days Discharge Instructions: Apply over primary dressing as directed. Secondary Dressing: Optifoam Non-Adhesive Dressing, 4x4 in 1 x Per Day/15 Days Discharge Instructions: Apply as donut over dressing Secured With: Conforming Stretch Gauze Bandage, Sterile 2x75 (in/in) 1 x Per Day/15 Days Discharge Instructions: Secure with stretch gauze as directed. Secured With: Transpore Surgical Tape, 2x10 (in/yd) 1 x Per Day/15 Days Discharge Instructions: Secure dressing with tape as directed. 1. Clindamycin 2. Go to the ED for further evaluation and potential admission 3. Follow-up in 1 week Electronic Signature(s) Signed: 08/08/2021 12:41:49 PM By: Geralyn Corwin DO Entered By: Geralyn Corwin on 08/08/2021 11:29:48 -------------------------------------------------------------------------------- HxROS Details Patient Name: Date of Service: [name] [name]. 08/08/2021 8:30 A M Medical Record Number: [id number] Patient Account Number: [id number] Date of Birth/Sex: Treating RN: 01/05/1956 (65 y.o. [name] Primary Care Provider: Tally Joe Other Clinician: Referring Provider: Treating Provider/Extender: Andrey Campanile in Treatment: 12 Information Obtained From Patient Eyes Medical History: Negative for: Cataracts; Glaucoma; Optic Neuritis Hematologic/Lymphatic Medical History: Negative for:  Anemia; Hemophilia; Human Immunodeficiency Virus; Lymphedema; Sickle Cell Disease Respiratory Medical History: Positive for: Sleep Apnea - reported ; uses CPAP Negative for: Aspiration; Asthma; Chronic Obstructive Pulmonary Disease (COPD); Pneumothorax; Tuberculosis Cardiovascular Medical History: Positive for: Coronary Artery Disease - reported; Hypertension - reported Negative for: Angina; Arrhythmia; Congestive Heart Failure; Deep Vein Thrombosis; Hypotension; Myocardial Infarction; Peripheral Arterial Disease; Peripheral Venous Disease; Phlebitis; Vasculitis Past Medical History Notes: blockages 2011 Gastrointestinal Medical History: Negative for: Cirrhosis ; Colitis; Crohns; Hepatitis A; Hepatitis B; Hepatitis C Endocrine Medical History: Positive for: Type II Diabetes - 2009 Negative for: Type I Diabetes Past Medical History Notes: wears monitor in skin that checks CBG constantly Time with diabetes: 2009 Treated with: Insulin Blood sugar tested every day: No Blood sugar testing results: Breakfast: 90-120 Genitourinary Medical History: Negative for: End Stage Renal Disease Immunological Medical History: Negative for: Lupus Erythematosus; Raynauds; Scleroderma Integumentary (Skin) Medical History: Negative for: History of Burn Musculoskeletal Medical History: Positive for: Osteoarthritis - knees Negative for: Gout; Rheumatoid Arthritis; Osteomyelitis Neurologic Medical History: Positive for: Neuropathy - minor Negative for: Dementia; Quadriplegia; Paraplegia; Seizure Disorder Oncologic Medical History: Negative for: Received Chemotherapy; Received Radiation Psychiatric Medical History: 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 Negative for: Anorexia/bulimia Immunizations Pneumococcal Vaccine: Received Pneumococcal Vaccination: Yes Received Pneumococcal Vaccination On or After 60th Birthday: Yes Immunization Notes: up to date on  pneumonia shots per his PCP Implantable Devices None Hospitalization / Surgery History Type of Hospitalization/Surgery by pass heart urology stent ORIF of left 2nd toe Family and Social History Cancer: Yes - Mother; Heart Disease: Yes - Father; Hypertension: Yes - Father; Stroke: Yes - Siblings; Never smoker; Marital Status - Married; Alcohol Use: Rarely - beer; Drug Use: No History; Caffeine Use: Daily; Financial Concerns: No; Food, Clothing or Shelter Needs: No; Support System Lacking: No; Transportation Concerns: No Electronic Signature(s) Signed: 08/08/2021 12:41:49 PM By: Geralyn Corwin DO Signed: 08/08/2021 5:01:42 PM By: Antonieta Iba Entered By: Geralyn Corwin on 08/08/2021 11:25:47 -------------------------------------------------------------------------------- SuperBill Details Patient Name: Date of Service: [name] [name] 08/08/2021 Medical Record Number: [id number] Patient Account Number: [id number] Date of Birth/Sex:  Treating RN: 1956/11/19 (65 y.o. [name] [name], [name] [name] Primary Care Provider: Tally Joe Other Clinician: Referring Provider: Treating Provider/Extender: Andrey Campanile in Treatment: 12 Diagnosis Coding ICD-10 Codes Code Description [phone] 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 L97.519 Non-pressure chronic ulcer of other part of right foot with unspecified severity E11.621 Type 2 diabetes mellitus with foot ulcer E11.40 Type 2 diabetes mellitus with diabetic neuropathy, unspecified Facility Procedures CPT4 Code: [id number] Description: [phone] - WOUND CARE VISIT-LEV 5 EST PT Modifier: Quantity: 1 Physician Procedures : CPT4 Code Description Modifier [id number] - WC PHYS LEVEL 4 - EST PT ICD-10 Diagnosis Description L97.529 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 L97.519 Non-pressure chronic ulcer of other part of right foot  with  unspecified severity E11.621 Type 2 diabetes mellitus with foot ulcer E11.40 Type 2 diabetes mellitus with diabetic neuropathy, unspecified Quantity: 1 Electronic Signature(s) Signed: 08/08/2021 12:41:49 PM By: Geralyn Corwin DO Entered By: Geralyn Corwin on 08/08/2021 11:30:33

## 2021-08-08 NOTE — H&P (Signed)
History and Physical    [name] [name] AVW:[id number] DOB: 08/11/1956 DOA: 08/08/2021  PCP: Tally Joe, MD  Patient coming from: Home  Chief Complaint: toe ulcer  HPI: [name] [name] is a 65 y.o. male with medical history significant of DM2, HTN, HLD. Presenting LLE cellulitis and right great toe pain. He follows with the wound care center. For the last 2 years, he had had non-healing diabetic foot ulcers per his report. Over the last week or so, he has had worsening erythema, pain and odor in his right right toe. He was seen by the wound care clinic today and fresh dressings were applied. It was recommended that he come to the ED for IV abx and imaging. He denies any other aggravating or alleviating factors.   ED Course: XR foot and MRI foot were ordered. He was found to have a white count of 19.2. TRH was called for admission.   Review of Systems:  Denies CP, dyspnea, N/V/D, fevers, chills. Review of systems is otherwise negative for all not mentioned in HPI.   PMHx Past Medical History:  Diagnosis Date   Anxiety    Bulbous urethral stricture    chronic--- hx multiple dilatation's and urolume stent's   CAD (coronary artery disease) 04/ 2011  (previous cardiologist-  dr h. Katrinka Blazing , lov 06/ 2016)  currently followed by pcp   03-03-2010  CABG x 5, LIMA to LAD, left radial artery to OM1,SVG to R1, seqSVG to PDA and PLA   ED (erectile dysfunction)    Essential hypertension, benign    GERD (gastroesophageal reflux disease)    History of pancreatitis 2005   Mixed hyperlipidemia    OSA on CPAP    S/P CABG x 5 03/03/2010   Type II or unspecified type diabetes mellitus without mention of complication, not stated as uncontrolled    endocrinologist-  dr Sharl Ma    PSHx Past Surgical History:  Procedure Laterality Date   CARDIAC CATHETERIZATION  03-01-2010   dr Verdis Prime   nuclear stress test --positive moderate ishemia:  severe multivessel CAD w/ total occlusion of pLAD, HG obstrucion of  pPDA with supplies collaterals around the apex to the LAD, severe stenosis distal RCA, total occlusion OM1, 75% D1, normal LVF    CORONARY ARTERY BYPASS GRAFT  03-03-2010  dr hendrickson    LIMA to LAD, SVG to RI, left radial artery to OM1, seqSVG  to PDA and PLA   CYSTO W/ DILATATION , LASER INCISION OF URETHRAL STRICTURE  02-25-2005   dr Vernie Ammons  [facility]   CYSTO/  DILATION URETHRAL MEATAL STENOSIS AND DILATION BULBOUS STRICTURE  12-21-2004   dr Vonita Moss  [facility]   CYSTO/ RETROGRADE PYELOGRAM/ DILATATION URETHRAL STRICTURE AND PLACEMENT UROLUME STENT  08-04-2011    dr Vernie Ammons  [facility]   CYSTOSCOPY WITH DIRECT VISION INTERNAL URETHROTOMY  07/15/2008   dr Vernie Ammons  [facility]   and UroLume Stent placement   CYSTOSCOPY WITH URETHRAL DILATATION N/A 04/19/2018   Procedure: CYSTOSCOPY WITH BALLOON URETHRAL DILATATION;  Surgeon: Ihor Gully, MD;  Location: [facility];  Service: Urology;  Laterality: N/A;   ORIF TOE FRACTURE Left 05/22/2013   Procedure: OPEN REDUCTION INTERNAL FIXATION (ORIF) LEFT SECOND TOE PROXIMAL PHALANX FRACTURE/LEFT PERCUTANEOUS PINNING;  Surgeon: Toni Arthurs, MD;  Location: [facility] OR;  Service: Orthopedics;  Laterality: Left;   TONSILLECTOMY  child    SocHx  reports that he has never smoked. He has never used smokeless tobacco. He reports current alcohol use. He  reports that he does not use drugs.  No Known Allergies  FamHx Family History  Problem Relation Age of Onset   Cancer Mother        lung   Hypertension Father    CAD Father    CVA Brother     Prior to Admission medications   Medication Sig Start Date End Date Taking? Authorizing Provider  ALPRAZolam Prudy Feeler) 0.5 MG tablet Take 0.5 mg by mouth 2 (two) times daily as needed for anxiety.   Yes [provider]  amLODipine (NORVASC) 10 MG tablet Take 10 mg by mouth at bedtime. 06/14/20  Yes [provider]  aspirin 325 MG tablet Take 325 mg by mouth daily.   Yes [provider]  atorvastatin  (LIPITOR) 80 MG tablet Take 80 mg by mouth every evening.   Yes [provider]  buPROPion (WELLBUTRIN XL) 300 MG 24 hr tablet Take 300 mg by mouth every morning.    Yes [provider]  fenofibrate 160 MG tablet Take 160 mg by mouth every evening. 07/01/14  Yes [provider]  fish oil-omega-3 fatty acids 1000 MG capsule Take 1 g by mouth daily.    Yes [provider]  HUMALOG MIX 50/50 KWIKPEN (50-50) 100 UNIT/ML Kwikpen Inject 40 Units into the skin 3 (three) times daily before meals. 07/19/20  Yes [provider]  metoprolol tartrate (LOPRESSOR) 100 MG tablet Take 100 mg by mouth 2 (two) times daily.   Yes [provider]  olmesartan-hydrochlorothiazide (BENICAR HCT) 40-25 MG per tablet Take 1 tablet by mouth every morning.    Yes [provider]  omeprazole (PRILOSEC) 20 MG capsule Take 20 mg by mouth every evening.    Yes [provider]  OZEMPIC, 1 MG/DOSE, 2 MG/1.5ML SOPN Inject 1 mg into the skin once a week. Friday 03/12/20  Yes [provider]  potassium chloride SA (K-DUR,KLOR-CON) 20 MEQ tablet Take 40 mEq by mouth every morning.   Yes [provider]  sildenafil (VIAGRA) 100 MG tablet Take 100 mg by mouth daily as needed for erectile dysfunction.   Yes [provider]  NOVOFINE 32G X 6 MM MISC  07/01/14   [provider]    Physical Exam: Vitals:   08/08/21 1010 08/08/21 1149 08/08/21 1300 08/08/21 1415  BP: 138/88 (!) 145/87 (!) 153/84 (!) 162/82  Pulse: 74 71 71 74  Resp: 18 18 18 18   Temp: 98.3 F (36.8 C)     TempSrc: Oral     SpO2: 100% 100% 100% 100%  Weight:  112.9 kg      General: 65 y.o. male resting in bed in NAD Eyes: PERRL, normal sclera ENMT: Nares patent w/o discharge, orophaynx clear, dentition normal, ears w/o discharge/lesions/ulcers Neck: Supple, trachea midline Cardiovascular: RRR, +S1, S2, no m/g/r, equal pulses throughout Respiratory: CTABL, no  w/r/r, normal WOB GI: BS+, NDNT, no masses noted, no organomegaly noted MSK: No c/c, LLE erythematous w/ mild edema, b/l great toes bandaging is CDI Skin: No rashes, bruises, ulcerations noted Neuro: A&O x 3, no focal deficits Psyc: Appropriate interaction and affect, calm/cooperative  Labs on Admission: I have personally reviewed following labs and imaging studies  CBC: Recent Labs  Lab 08/08/21 1307  WBC 19.2*  NEUTROABS PENDING  HGB 13.4  HCT 40.3  MCV 87.6  PLT 295   Basic Metabolic Panel: Recent Labs  Lab 08/08/21 1307  NA 139  K 3.9  CL 102  CO2 24  GLUCOSE  153*  BUN 35*  CREATININE 1.63*  CALCIUM 7.6*   GFR: CrCl cannot be calculated (Unknown ideal weight.). Liver Function Tests: Recent Labs  Lab 08/08/21 1307  AST 63*  ALT 88*  ALKPHOS 82  BILITOT 1.1  PROT 7.3  ALBUMIN 3.2*   No results for input(s): LIPASE, AMYLASE in the last 168 hours. No results for input(s): AMMONIA in the last 168 hours. Coagulation Profile: No results for input(s): INR, PROTIME in the last 168 hours. Cardiac Enzymes: No results for input(s): CKTOTAL, CKMB, CKMBINDEX, TROPONINI in the last 168 hours. BNP (last 3 results) No results for input(s): PROBNP in the last 8760 hours. HbA1C: No results for input(s): HGBA1C in the last 72 hours. CBG: Recent Labs  Lab 08/08/21 1010  GLUCAP 111*   Lipid Profile: No results for input(s): CHOL, HDL, LDLCALC, TRIG, CHOLHDL, LDLDIRECT in the last 72 hours. Thyroid Function Tests: No results for input(s): TSH, T4TOTAL, FREET4, T3FREE, THYROIDAB in the last 72 hours. Anemia Panel: No results for input(s): VITAMINB12, FOLATE, FERRITIN, TIBC, IRON, RETICCTPCT in the last 72 hours. Urine analysis:    Component Value Date/Time   COLORURINE YELLOW 03/02/2010 1609   APPEARANCEUR CLOUDY (A) 03/02/2010 1609   LABSPEC 1.006 03/02/2010 1609   PHURINE 8.0 03/02/2010 1609   GLUCOSEU NEGATIVE 03/02/2010 1609   HGBUR SMALL (A) 03/02/2010  1609   BILIRUBINUR NEGATIVE 03/02/2010 1609   KETONESUR NEGATIVE 03/02/2010 1609   PROTEINUR NEGATIVE 03/02/2010 1609   UROBILINOGEN 0.2 03/02/2010 1609   NITRITE NEGATIVE 03/02/2010 1609   LEUKOCYTESUR LARGE (A) 03/02/2010 1609    Radiological Exams on Admission: No results found.  EKG: None obtain in ED  Assessment/Plan Bilateral great toe wounds/infection LLE cellulitis     - admit to inpt, tele     - XR foot, MRI foot pending     - afebrile, BP/HR ok; he does have a WBC of 19k+     - get bld cx     - ID consulted, podiatry consulted; appreciate assistance     - will hold on abx for now, so that we can get good Bx if necessary; if his vitals/clinical picture changes and he needs abx sooner, can start ancef     - pain control  DM2     - A1c, SSI, glucose checks, DM diet  CKD3a     - he is at baseline, watch nephrotoxins  GERD     - PPI  HLD     - hold statin d/t elevated LFT  HTN     - continue home regimen  Elevated LFTs     - check hep panel, RUQ [place]  Hypocalcemia     - corrected Ca2+ 8.2     - Add TUMS 400mg  BID, check PTH, vit d  DVT prophylaxis: lovenox  Code Status: FULL  Family Communication: None at bedside  Consults called: ID, Podiatry  Status is: Inpatient  Remains inpatient appropriate because:Inpatient level of care appropriate due to severity of illness  Dispo: The patient is from: Home              Anticipated d/c is to: Home              Patient currently is not medically stable to d/c.   Difficult to place patient No  Time spent coordinating admission: 70 minutes  Mickala Laton A Exavior Kimmons DO [facility]  If 7PM-7AM, please contact night-coverage [email or web address]  08/08/2021, 2:56 PM

## 2021-08-08 NOTE — Plan of Care (Signed)

## 2021-08-08 NOTE — Consult Note (Signed)
Reason for Consult: Infection right foot, toe ulcer Referring Physician: Dr. Margie Ege, DO  [name] [name] is an 65 y.o. male.  HPI: [name] [name] is a 65 y.o. male with medical history significant of DM2, HTN, HLD. Presenting LLE cellulitis and right great toe pain.  Patient is a longstanding history of wounds to his toes for last couple of years.  I last saw the patient December and since then he is under the care of the wound care center.  He was seen today and given worsening of the wound to his directly to the emergency department for imaging as well as antibiotics.  Infectious diseases already been consulted and antibiotics are being held for intraoperative cultures.  Past Medical History:  Diagnosis Date   Anxiety    Bulbous urethral stricture    chronic--- hx multiple dilatation's and urolume stent's   CAD (coronary artery disease) 04/ 2011  (previous cardiologist-  dr h. Katrinka Blazing , lov 06/ 2016)  currently followed by pcp   03-03-2010  CABG x 5, LIMA to LAD, left radial artery to OM1,SVG to R1, seqSVG to PDA and PLA   ED (erectile dysfunction)    Essential hypertension, benign    GERD (gastroesophageal reflux disease)    History of pancreatitis 2005   Mixed hyperlipidemia    OSA on CPAP    S/P CABG x 5 03/03/2010   Type II or unspecified type diabetes mellitus without mention of complication, not stated as uncontrolled    endocrinologist-  dr Sharl Ma    Past Surgical History:  Procedure Laterality Date   CARDIAC CATHETERIZATION  03-01-2010   dr Verdis Prime   nuclear stress test --positive moderate ishemia:  severe multivessel CAD w/ total occlusion of pLAD, HG obstrucion of pPDA with supplies collaterals around the apex to the LAD, severe stenosis distal RCA, total occlusion OM1, 75% D1, normal LVF    CORONARY ARTERY BYPASS GRAFT  03-03-2010  dr hendrickson    LIMA to LAD, SVG to RI, left radial artery to OM1, seqSVG  to PDA and PLA   CYSTO W/ DILATATION , LASER INCISION OF  URETHRAL STRICTURE  02-25-2005   dr Vernie Ammons  [facility]   CYSTO/  DILATION URETHRAL MEATAL STENOSIS AND DILATION BULBOUS STRICTURE  12-21-2004   dr Vonita Moss  [facility]   CYSTO/ RETROGRADE PYELOGRAM/ DILATATION URETHRAL STRICTURE AND PLACEMENT UROLUME STENT  08-04-2011    dr Vernie Ammons  [facility]   CYSTOSCOPY WITH DIRECT VISION INTERNAL URETHROTOMY  07/15/2008   dr Vernie Ammons  [facility]   and UroLume Stent placement   CYSTOSCOPY WITH URETHRAL DILATATION N/A 04/19/2018   Procedure: CYSTOSCOPY WITH BALLOON URETHRAL DILATATION;  Surgeon: Ihor Gully, MD;  Location: [facility] [place];  Service: Urology;  Laterality: N/A;   ORIF TOE FRACTURE Left 05/22/2013   Procedure: OPEN REDUCTION INTERNAL FIXATION (ORIF) LEFT SECOND TOE PROXIMAL PHALANX FRACTURE/LEFT PERCUTANEOUS PINNING;  Surgeon: Toni Arthurs, MD;  Location: [facility] OR;  Service: Orthopedics;  Laterality: Left;   TONSILLECTOMY  child    Family History  Problem Relation Age of Onset   Cancer Mother        lung   Hypertension Father    CAD Father    CVA Brother     Social History:  reports that he has never smoked. He has never used smokeless tobacco. He reports current alcohol use. He reports that he does not use drugs.  Allergies: No Known Allergies  Medications: I have reviewed the patient's current medications.  Results  for orders placed or performed during the hospital encounter of 08/08/21 (from the past 48 hour(s))  CBG monitoring, ED     Status: Abnormal   Collection Time: 08/08/21 10:10 AM  Result Value Ref Range   Glucose-Capillary 111 (H) 70 - 99 mg/dL    Comment: Glucose reference range applies only to samples taken after fasting for at least 8 hours.  Comprehensive metabolic panel     Status: Abnormal   Collection Time: 08/08/21  1:07 PM  Result Value Ref Range   Sodium 139 135 - 145 mmol/L   Potassium 3.9 3.5 - 5.1 mmol/L   Chloride 102 98 - 111 mmol/L   CO2 24 22 - 32 mmol/L   Glucose, Bld 153 (H) 70 - 99 mg/dL    Comment: Glucose  reference range applies only to samples taken after fasting for at least 8 hours.   BUN 35 (H) 8 - 23 mg/dL   Creatinine, Ser 6.44 (H) 0.61 - 1.24 mg/dL   Calcium 7.6 (L) 8.9 - 10.3 mg/dL   Total Protein 7.3 6.5 - 8.1 g/dL   Albumin 3.2 (L) 3.5 - 5.0 g/dL   AST 63 (H) 15 - 41 U/L   ALT 88 (H) 0 - 44 U/L   Alkaline Phosphatase 82 38 - 126 U/L   Total Bilirubin 1.1 0.3 - 1.2 mg/dL   GFR, Estimated 47 (L) >60 mL/min    Comment: (NOTE) Calculated using the CKD-EPI Creatinine Equation (2021)    Anion gap 13 5 - 15    Comment: Performed at [facility], [address] [place], [place] [address]  CBC with Differential     Status: Abnormal   Collection Time: 08/08/21  1:07 PM  Result Value Ref Range   WBC 19.2 (H) 4.0 - 10.5 K/uL   RBC 4.60 4.22 - 5.81 MIL/uL   Hemoglobin 13.4 13.0 - 17.0 g/dL   HCT 25.9 56.3 - 87.5 %   MCV 87.6 80.0 - 100.0 fL   MCH 29.1 26.0 - 34.0 pg   MCHC 33.3 30.0 - 36.0 g/dL   RDW 64.3 32.9 - 51.8 %   Platelets 295 150 - 400 K/uL   nRBC 0.0 0.0 - 0.2 %   Neutrophils Relative % 89 %   Neutro Abs 17.0 (H) 1.7 - 7.7 K/uL   Lymphocytes Relative 5 %   Lymphs Abs 1.0 0.7 - 4.0 K/uL   Monocytes Relative 4 %   Monocytes Absolute 0.7 0.1 - 1.0 K/uL   Eosinophils Relative 0 %   Eosinophils Absolute 0.0 0.0 - 0.5 K/uL   Basophils Relative 0 %   Basophils Absolute 0.1 0.0 - 0.1 K/uL   WBC Morphology MILD LEFT SHIFT (1-5% METAS, OCC MYELO, OCC BANDS)     Comment: VACUOLATED NEUTROPHILS   Immature Granulocytes 2 %   Abs Immature Granulocytes 0.35 (H) 0.00 - 0.07 K/uL    Comment: Performed at [facility], [address], [place], [place] [address]  Lactic acid     Status: None   Collection Time: 08/08/21  1:07 PM  Result Value Ref Range   Lactic Acid, Venous 1.4 0.5 - 1.9 mmol/L    Comment: Performed at [facility], [address], [place], [place] [address]  Sedimentation rate     Status: Abnormal    Collection Time: 08/08/21  1:07 PM  Result Value Ref Range   Sed Rate 37 (H) 0 - 16 mm/hr    Comment:  Performed at [facility], [address], [place], [place] [address]  C-reactive protein     Status: Abnormal   Collection Time: 08/08/21  1:07 PM  Result Value Ref Range   CRP 13.9 (H) <1.0 mg/dL    Comment: Performed at [facility], [address] [place], [place] [address]  Resp Panel by RT-PCR (Flu A&B, Covid) Nasopharyngeal Swab     Status: None   Collection Time: 08/08/21  3:00 PM   Specimen: Nasopharyngeal Swab; Nasopharyngeal(NP) swabs in vial transport medium  Result Value Ref Range   SARS Coronavirus 2 by RT PCR NEGATIVE NEGATIVE    Comment: (NOTE) SARS-CoV-2 target nucleic acids are NOT DETECTED.  The SARS-CoV-2 RNA is generally detectable in upper respiratory specimens during the acute phase of infection. The lowest concentration of SARS-CoV-2 viral copies this assay can detect is 138 copies/mL. A negative result does not preclude SARS-Cov-2 infection and should not be used as the sole basis for treatment or other patient management decisions. A negative result may occur with  improper specimen collection/handling, submission of specimen other than nasopharyngeal swab, presence of viral mutation(s) within the areas targeted by this assay, and inadequate number of viral copies(<138 copies/mL). A negative result must be combined with clinical observations, patient history, and epidemiological information. The expected result is Negative.  Fact Sheet for Patients:  BloggerCourse.com  Fact Sheet for Healthcare Providers:  SeriousBroker.it  This test is no t yet approved or cleared by the [place] FDA and  has been authorized for detection and/or diagnosis of SARS-CoV-2 by FDA under an Emergency Use Authorization (EUA). This EUA will remain  in effect (meaning this test can be  used) for the duration of the COVID-19 declaration under Section 564(b)(1) of the Act, 21 U.S.C.section 360bbb-3(b)(1), unless the authorization is terminated  or revoked sooner.       Influenza A by PCR NEGATIVE NEGATIVE   Influenza B by PCR NEGATIVE NEGATIVE    Comment: (NOTE) The Xpert Xpress SARS-CoV-2/FLU/RSV plus assay is intended as an aid in the diagnosis of influenza from Nasopharyngeal swab specimens and should not be used as a sole basis for treatment. Nasal washings and aspirates are unacceptable for Xpert Xpress SARS-CoV-2/FLU/RSV testing.  Fact Sheet for Patients: BloggerCourse.com  Fact Sheet for Healthcare Providers: SeriousBroker.it  This test is not yet approved or cleared by the [place] FDA and has been authorized for detection and/or diagnosis of SARS-CoV-2 by FDA under an Emergency Use Authorization (EUA). This EUA will remain in effect (meaning this test can be used) for the duration of the COVID-19 declaration under Section 564(b)(1) of the Act, 21 U.S.C. section 360bbb-3(b)(1), unless the authorization is terminated or revoked.  Performed at [facility], [address], [place], [place] [address]   Creatinine, serum     Status: Abnormal   Collection Time: 08/08/21  6:07 PM  Result Value Ref Range   Creatinine, Ser 1.51 (H) 0.61 - 1.24 mg/dL   GFR, Estimated 52 (L) >60 mL/min    Comment: (NOTE) Calculated using the CKD-EPI Creatinine Equation (2021) Performed at [facility], [address], [place], [place] [address]     MRI Right foot without contrast  Result Date: 08/08/2021 CLINICAL DATA:  Right great toe pain. EXAM: MRI OF THE RIGHT FOREFOOT WITHOUT CONTRAST TECHNIQUE: Multiplanar, multisequence MR imaging of the right forefoot was performed. No intravenous contrast was administered. COMPARISON:  Right great toe x-rays from same day. Right  foot  x-rays dated July 13, 2021. FINDINGS: Bones/Joint/Cartilage Abnormal marrow edema with corresponding decreased T1 marrow signal involving the first proximal and distal phalanges. Similar signal abnormality involving the second distal phalanx. Pathologic fracture of the base of the first distal phalanx. No dislocation. Mild hallux valgus deformity and first MTP joint osteoarthritis. Large first IP joint effusion. Ligaments The first IP joint collateral ligaments are not visualized. Muscles and Tendons The flexor pollicis longus tendon is completely torn and retracted to the mid first metatarsal (series 9, image 23). Associated tenosynovitis. Increased T2 signal within and atrophy of the intrinsic muscles of the forefoot, nonspecific, but likely related to diabetic muscle changes. Soft tissue Diffuse soft tissue swelling. Ulceration at the plantar aspect of the first IP joint communicating with the joint space (series 9, image 23). 1.8 x 0.9 x 3.5 cm curvilinear gas and fluid collection along the dorsal and lateral aspect of the great toe, consistent with abscess (series 6, image 18). 1.8 x 1.1 x 1.1 cm fluid collection between the first and second metatarsals (series 7, image 15). Additional multi loculated fluid collection in the superficial dorsal foot overlying the second and third mid metatarsals, measuring 2.7 x 1.8 x 0.7 cm (series 9, image 13). Soft tissue ulceration at the tip of the second toe extending to bone. IMPRESSION: 1. Ulceration at the plantar aspect of the first IP joint communicating with the joint space with underlying first IP joint septic arthritis and osteomyelitis of the first proximal and distal phalanges. Pathologic fracture of the base of the first distal phalanx. 2. Ulceration at the tip of the second toe with underlying osteomyelitis of the second distal phalanx. 3. Complete tear of the flexor pollicis longus tendon with retraction to the mid first metatarsal. Associated infectious  tenosynovitis. 4. Multiple forefoot abscesses as described above, the largest a curvilinear abscess involving the dorsal and lateral aspect of the great toe, measuring up to 3.5 cm. Electronically Signed   By: Obie Dredge M.D.   On: 08/08/2021 17:37   DG Toe Great Right  Result Date: 08/08/2021 CLINICAL DATA:  First toe pain and underlying infection EXAM: RIGHT GREAT TOE COMPARISON:  07/13/2021 FINDINGS: There is now seen considerable lucency within the base of the first distal phalanx as well as fragmentation. Soft tissue wound is again identified. Soft tissue wound is also noted at the tip of the second distal phalanx with some suspicious findings of bony erosion. These findings are both consistent with osteomyelitis. The lucency seen previously at the medial base of the first proximal phalanx is again seen and stable. Hallux valgus deformity is again noted. IMPRESSION: Increased lucency and fragmentation at the base of the first distal phalanx consistent with osteomyelitis. Overlying soft tissue wound is again seen. A wound is noted at the tip of the second toe as well as some bony erosive changes in the tip of the distal phalanx suspicious for osteomyelitis. Electronically Signed   By: Alcide Clever M.D.   On: 08/08/2021 15:58    Review of Systems Blood pressure (!) 147/65, pulse 78, temperature 99.1 F (37.3 C), temperature source Oral, resp. rate 18, height 6' 0.5" (1.842 m), weight 118.6 kg, SpO2 100 %. Physical Exam General: AAO x3, NAD  Dermatological: On the right side there were 2 full-thickness ulcerations present of the hallux and there is a blister on the dorsal aspect of the hallux.  There does appear to be a fluid collection along the hallux as well going to the first  MPJ.  Also full-thickness ulceration of the distal portion of the right second toe with probing to bone.  Minimal edema but no significant erythema to this area.  There is cellulitis of the left leg.  Wound present to the  left hallux.  No significant erythema itself to the toe and seems to spare the toe and start at that level of the ankle and going just distal to the knee.  There is no fluctuation crepitation left side.  Vascular: Dorsalis Pedis artery and Posterior Tibial artery pedal pulses are 2/4 bilateral with immedate capillary fill time. There is no pain with calf compression, swelling, warmth, erythema.   Neruologic: Sensation decreased  Musculoskeletal: No significant pain on exam  Gait: Unassisted, Nonantalgic.   Assessment/Plan: Osteomyelitis, abscess right hallux/2nd toe  I do the x-rays with the MRI findings with him.  He is increased white blood cell count but currently afebrile.  I discussed with the patient and his wife who was at bedside treatment options for infection including limb salvage versus amputation.  However my recommendation is to proceed with amputation of the right hallux as well as partial amputation of the second toe.  I discussed with him that we do the surgery tomorrow.  There is a chance he may need to have a serial debridement depend on the infection intraoperatively.  I discussed the surgery as well as postoperative course including alternatives risks and complications.  Risks discussed included spread of infection, further amputation, delayed or nonhealing as well as general risks of surgery occluding stroke, heart attack, death.  We will plan on doing surgery tomorrow evening.  N.p.o. after 8 AM tomorrow.  X-ray ordered of the left foot.  Podiatry will continue to follow.   Vivi Barrack 08/08/2021, 7:48 PM

## 2021-08-08 NOTE — Consult Note (Signed)
Date of Admission:  08/08/2021          Reason for Consult: Bilateral diabetic foot ulcers with worsening osteomyelitis of right great toe, left lower extremity cellulitis    Referring Provider: Cherylann Ratel, MD   Assessment:  Osteomyelitis distal phalanx great toe and 2nd toe on R Rule out osteomyelitis left great toe Cellulitis vs venous stasis dermatitis left lower extremity Diabetes Mellitus  CAD Hyperlipidemia HTN  Plan:  Try to hold off on empiric antibiotics BUT if for example the LLE area becomes too concerning for ongoing cellulitis would start NARROW cellulitis specific antibiotics = IV ancef Would like to give antibiotic that are focused and based on cultures of pathogens obtained in OR MRI right foot--read pending MRI left foot ABIs and TBIS Consult to Orthopedics, VVS or Podiatry (has seen Podiatry in the past) ESR, CRP Screen HIV, viral hepatides  Active Problems:   Toe infection   Scheduled Meds: Continuous Infusions: PRN Meds:.  HPI: [name] [name] is a 65 y.o. male  with DM, HTN, Obesity, CAD hyperlipidemia who has been struggling with diabetic foot ulcers involving great toes bilaterally for several years, with the one on the right having first developed 3 years ago. He has been going to wound care clinic here during this time period and before that by wound care and podiatry for a different ulcer on right foot. He has been having local debridements and he said he had believed he was making good progress.   This week however the great toe began to drain malodorous material and he began to feel unwell with systemic symptoms of fever and nausea. During this same time period he developed painful erythema in left shin that he is concerned is cellulitis--and indeed it could be though it is curiously mainly found anteriorly  He has deep ulcer of left toe as well. Recent plain films in August had not shown osteomyelitis of the left big toe though  a fracture  was seen.  Plain films done today show fragmentation and lucency t great toe in distal phalanx c/w osteomyelitis and also erosive changes involving the adjacent proximal phalanx of 2nd toe.  Blood cultures have been sent and MRI performed of right foot  I would prefer to hold off antibiotics to treat his osteomyelitis to give [place] greater chance of recovering a microbe on culture from the OR.  I expect he will need amputation of 1st, 2nd toe on right.  I would also like to MRI his left foot as well  If his LLE erythema does indeed behave like a cellulitis we will be forced to start an antibiotics for this but in this case can use cefazolin since this is a non purulent process.  I spent 82 minutes with the patient including than 50% of the time in face to face counseling of the patient re the nature of osteomyelitis, diabetic foot infections, cellulitis, personally reviewing plain films of right foot today and left foot from 07/13/2021 reviewing CBC BMP and along with review of medical records in preparation for the visit and during the visit and in coordination of his care.      Review of Systems: Review of Systems  Constitutional:  Positive for fever and malaise/fatigue. Negative for chills, diaphoresis and weight loss.  HENT:  Negative for congestion, hearing loss, sore throat and tinnitus.   Eyes:  Negative for blurred vision and double vision.  Respiratory:  Negative for cough, sputum production, shortness of breath  and wheezing.   Cardiovascular:  Negative for chest pain, palpitations and leg swelling.  Gastrointestinal:  Positive for nausea. Negative for abdominal pain, blood in stool, constipation, diarrhea, heartburn, melena and vomiting.  Genitourinary:  Negative for dysuria, flank pain and hematuria.  Musculoskeletal:  Positive for myalgias. Negative for back pain, falls and joint pain.  Skin:  Negative for itching and rash.  Neurological:  Negative for dizziness, sensory  change, focal weakness, loss of consciousness, weakness and headaches.  Endo/Heme/Allergies:  Does not bruise/bleed easily.  Psychiatric/Behavioral:  Negative for depression, memory loss and suicidal ideas. The patient is not nervous/anxious.    Past Medical History:  Diagnosis Date   Anxiety    Bulbous urethral stricture    chronic--- hx multiple dilatation's and urolume stent's   CAD (coronary artery disease) 04/ 2011  (previous cardiologist-  dr h. Tamala Julian , lov 06/ 2016)  currently followed by pcp   03-03-2010  CABG x 5, LIMA to LAD, left radial artery to OM1,SVG to R1, seqSVG to PDA and PLA   ED (erectile dysfunction)    Essential hypertension, benign    GERD (gastroesophageal reflux disease)    History of pancreatitis 2005   Mixed hyperlipidemia    OSA on CPAP    S/P CABG x 5 03/03/2010   Type II or unspecified type diabetes mellitus without mention of complication, not stated as uncontrolled    endocrinologist-  dr Buddy Duty    Social History   Tobacco Use   Smoking status: Never   Smokeless tobacco: Never  Vaping Use   Vaping Use: Never used  Substance Use Topics   Alcohol use: Yes    Comment: occasional   Drug use: No    Family History  Problem Relation Age of Onset   Cancer Mother        lung   Hypertension Father    CAD Father    CVA Brother    No Known Allergies  OBJECTIVE: Blood pressure (!) 162/82, pulse 74, temperature 98.3 F (36.8 C), temperature source Oral, resp. rate 18, weight 112.9 kg, SpO2 100 %.  Physical Exam Constitutional:      Appearance: He is well-developed.  HENT:     Head: Normocephalic and atraumatic.  Eyes:     Conjunctiva/sclera: Conjunctivae normal.  Cardiovascular:     Rate and Rhythm: Normal rate and regular rhythm.  Pulmonary:     Effort: Pulmonary effort is normal. No respiratory distress.     Breath sounds: No wheezing.  Abdominal:     General: There is no distension.     Palpations: Abdomen is soft.  Musculoskeletal:         General: No tenderness. Normal range of motion.     Cervical back: Normal range of motion and neck supple.  Skin:    General: Skin is warm and dry.     Coloration: Skin is not pale.     Findings: Erythema present. No rash.  Neurological:     General: No focal deficit present.     Mental Status: He is alert and oriented to person, place, and time. Mental status is at baseline.  Psychiatric:        Mood and Affect: Mood normal.        Behavior: Behavior normal.        Thought Content: Thought content normal.        Judgment: Judgment normal.    Right toes 08/08/2021:      Left toe 08/08/2021:  Erythema LLE c/w cellulitis vs stasis dermatitis (seems fairly acute for latter) 08/08/2021:       Lab Results Lab Results  Component Value Date   WBC 19.2 (H) 08/08/2021   HGB 13.4 08/08/2021   HCT 40.3 08/08/2021   MCV 87.6 08/08/2021   PLT 295 08/08/2021    Lab Results  Component Value Date   CREATININE 1.63 (H) 08/08/2021   BUN 35 (H) 08/08/2021   NA 139 08/08/2021   K 3.9 08/08/2021   CL 102 08/08/2021   CO2 24 08/08/2021    Lab Results  Component Value Date   ALT 88 (H) 08/08/2021   AST 63 (H) 08/08/2021   ALKPHOS 82 08/08/2021   BILITOT 1.1 08/08/2021     Microbiology: Recent Results (from the past 240 hour(s))  Resp Panel by RT-PCR (Flu A&B, Covid) Nasopharyngeal Swab     Status: None   Collection Time: 08/08/21  3:00 PM   Specimen: Nasopharyngeal Swab; Nasopharyngeal(NP) swabs in vial transport medium  Result Value Ref Range Status   SARS Coronavirus 2 by RT PCR NEGATIVE NEGATIVE Final    Comment: (NOTE) SARS-CoV-2 target nucleic acids are NOT DETECTED.  The SARS-CoV-2 RNA is generally detectable in upper respiratory specimens during the acute phase of infection. The lowest concentration of SARS-CoV-2 viral copies this assay can detect is 138 copies/mL. A negative result does not preclude SARS-Cov-2 infection and should not be used as the sole  basis for treatment or other patient management decisions. A negative result may occur with  improper specimen collection/handling, submission of specimen other than nasopharyngeal swab, presence of viral mutation(s) within the areas targeted by this assay, and inadequate number of viral copies(<138 copies/mL). A negative result must be combined with clinical observations, patient history, and epidemiological information. The expected result is Negative.  Fact Sheet for Patients:  EntrepreneurPulse.com.au  Fact Sheet for Healthcare Providers:  IncredibleEmployment.be  This test is no t yet approved or cleared by the [place] FDA and  has been authorized for detection and/or diagnosis of SARS-CoV-2 by FDA under an Emergency Use Authorization (EUA). This EUA will remain  in effect (meaning this test can be used) for the duration of the COVID-19 declaration under Section 564(b)(1) of the Act, 21 U.S.C.section 360bbb-3(b)(1), unless the authorization is terminated  or revoked sooner.       Influenza A by PCR NEGATIVE NEGATIVE Final   Influenza B by PCR NEGATIVE NEGATIVE Final    Comment: (NOTE) The Xpert Xpress SARS-CoV-2/FLU/RSV plus assay is intended as an aid in the diagnosis of influenza from Nasopharyngeal swab specimens and should not be used as a sole basis for treatment. Nasal washings and aspirates are unacceptable for Xpert Xpress SARS-CoV-2/FLU/RSV testing.  Fact Sheet for Patients: EntrepreneurPulse.com.au  Fact Sheet for Healthcare Providers: IncredibleEmployment.be  This test is not yet approved or cleared by the [place] FDA and has been authorized for detection and/or diagnosis of SARS-CoV-2 by FDA under an Emergency Use Authorization (EUA). This EUA will remain in effect (meaning this test can be used) for the duration of the COVID-19 declaration under Section 564(b)(1) of the Act,  21 U.S.C. section 360bbb-3(b)(1), unless the authorization is terminated or revoked.  Performed at [facility], [place] [address], [place], [place] [address]     Alcide Evener, [place] [facility] [phone] pager  08/08/2021, 4:24 PM

## 2021-08-09 ENCOUNTER — Inpatient Hospital Stay (HOSPITAL_COMMUNITY): Payer: 59

## 2021-08-09 ENCOUNTER — Inpatient Hospital Stay (HOSPITAL_COMMUNITY): Payer: 59 | Admitting: Certified Registered Nurse Anesthetist

## 2021-08-09 ENCOUNTER — Encounter (HOSPITAL_COMMUNITY): Payer: Self-pay | Admitting: Internal Medicine

## 2021-08-09 ENCOUNTER — Encounter (HOSPITAL_COMMUNITY)
Admission: EM | Disposition: A | Discharge status: Home Health | Payer: Self-pay | Source: Ambulatory Visit | Attending: Internal Medicine

## 2021-08-09 DIAGNOSIS — L03115 Cellulitis of right lower limb: Secondary | ICD-10-CM | POA: Diagnosis not present

## 2021-08-09 DIAGNOSIS — L0390 Cellulitis, unspecified: Secondary | ICD-10-CM

## 2021-08-09 DIAGNOSIS — M869 Osteomyelitis, unspecified: Secondary | ICD-10-CM | POA: Diagnosis not present

## 2021-08-09 DIAGNOSIS — L02611 Cutaneous abscess of right foot: Secondary | ICD-10-CM

## 2021-08-09 DIAGNOSIS — L97522 Non-pressure chronic ulcer of other part of left foot with fat layer exposed: Secondary | ICD-10-CM | POA: Diagnosis not present

## 2021-08-09 DIAGNOSIS — R7989 Other specified abnormal findings of blood chemistry: Secondary | ICD-10-CM | POA: Diagnosis not present

## 2021-08-09 DIAGNOSIS — L089 Local infection of the skin and subcutaneous tissue, unspecified: Secondary | ICD-10-CM | POA: Diagnosis not present

## 2021-08-09 DIAGNOSIS — M86171 Other acute osteomyelitis, right ankle and foot: Secondary | ICD-10-CM

## 2021-08-09 DIAGNOSIS — Z6841 Body Mass Index (BMI) 40.0 and over, adult: Secondary | ICD-10-CM

## 2021-08-09 DIAGNOSIS — E0843 Diabetes mellitus due to underlying condition with diabetic autonomic (poly)neuropathy: Secondary | ICD-10-CM

## 2021-08-09 HISTORY — PX: AMPUTATION: SHX166

## 2021-08-09 LAB — BLOOD CULTURE ID PANEL (REFLEXED) - BCID2

## 2021-08-09 LAB — COMPREHENSIVE METABOLIC PANEL
ALT: 64 U/L — ABNORMAL HIGH (ref 0–44)
AST: 29 U/L (ref 15–41)
Albumin: 2.7 g/dL — ABNORMAL LOW (ref 3.5–5.0)
Alkaline Phosphatase: 81 U/L (ref 38–126)
Anion gap: 11 (ref 5–15)
BUN: 27 mg/dL — ABNORMAL HIGH (ref 8–23)
CO2: 25 mmol/L (ref 22–32)
Calcium: 7.5 mg/dL — ABNORMAL LOW (ref 8.9–10.3)
Chloride: 99 mmol/L (ref 98–111)
Creatinine, Ser: 1.21 mg/dL (ref 0.61–1.24)
GFR, Estimated: >60 mL/min (ref >60)
Glucose, Bld: 195 mg/dL — ABNORMAL HIGH (ref 70–99)
Potassium: 3.5 mmol/L (ref 3.5–5.1)
Sodium: 135 mmol/L (ref 135–145)
Total Bilirubin: 1.2 mg/dL (ref 0.3–1.2)
Total Protein: 6.4 g/dL — ABNORMAL LOW (ref 6.5–8.1)

## 2021-08-09 LAB — HEPATITIS PANEL, ACUTE
HCV Ab: NONREACTIVE
HCV Ab: NONREACTIVE
Hep A IgM: NONREACTIVE
Hep A IgM: NONREACTIVE
Hep B C IgM: NONREACTIVE
Hep B C IgM: NONREACTIVE
Hepatitis B Surface Ag: NONREACTIVE
Hepatitis B Surface Ag: NONREACTIVE

## 2021-08-09 LAB — PTH, INTACT AND CALCIUM
Calcium, Total (PTH): 6.9 mg/dL — CL (ref 8.6–10.2)
PTH: 21 pg/mL (ref 15–65)

## 2021-08-09 LAB — CBC
HCT: 36.9 % — ABNORMAL LOW (ref 39.0–52.0)
Hemoglobin: 12.5 g/dL — ABNORMAL LOW (ref 13.0–17.0)
MCH: 28.9 pg (ref 26.0–34.0)
MCHC: 33.9 g/dL (ref 30.0–36.0)
MCV: 85.2 fL (ref 80.0–100.0)
Platelets: 255 10*3/uL (ref 150–400)
RBC: 4.33 MIL/uL (ref 4.22–5.81)
RDW: 14.5 % (ref 11.5–15.5)
WBC: 20.2 10*3/uL — ABNORMAL HIGH (ref 4.0–10.5)
nRBC: 0.1 % (ref 0.0–0.2)

## 2021-08-09 LAB — GLUCOSE, CAPILLARY
Glucose-Capillary: 185 mg/dL — ABNORMAL HIGH (ref 70–99)
Glucose-Capillary: 244 mg/dL — ABNORMAL HIGH (ref 70–99)
Glucose-Capillary: 165 mg/dL — ABNORMAL HIGH (ref 70–99)
Glucose-Capillary: 275 mg/dL — ABNORMAL HIGH (ref 70–99)

## 2021-08-09 LAB — SEDIMENTATION RATE: Sed Rate: 44 mm/hr — ABNORMAL HIGH (ref 0–16)

## 2021-08-09 LAB — HEMOGLOBIN A1C
Hgb A1c MFr Bld: 6.2 % — ABNORMAL HIGH (ref 4.8–5.6)
Mean Plasma Glucose: 131.24 mg/dL

## 2021-08-09 LAB — HIV ANTIBODY (ROUTINE TESTING W REFLEX)
HIV Screen 4th Generation wRfx: NONREACTIVE
HIV Screen 4th Generation wRfx: NONREACTIVE

## 2021-08-09 LAB — VITAMIN D 25 HYDROXY (VIT D DEFICIENCY, FRACTURES): Vit D, 25-Hydroxy: 21.53 ng/mL — ABNORMAL LOW (ref 30–100)

## 2021-08-09 LAB — C-REACTIVE PROTEIN: CRP: 16.9 mg/dL — ABNORMAL HIGH (ref <1.0)

## 2021-08-09 IMAGING — DX DG FOOT 2V*R*
2 series · 2 of 2 positions shown · non-contrast
Comparison: [DATE]

CLINICAL DATA: Post right foot surgery.

EXAM:
RIGHT FOOT - 2 VIEW

[foot ap]
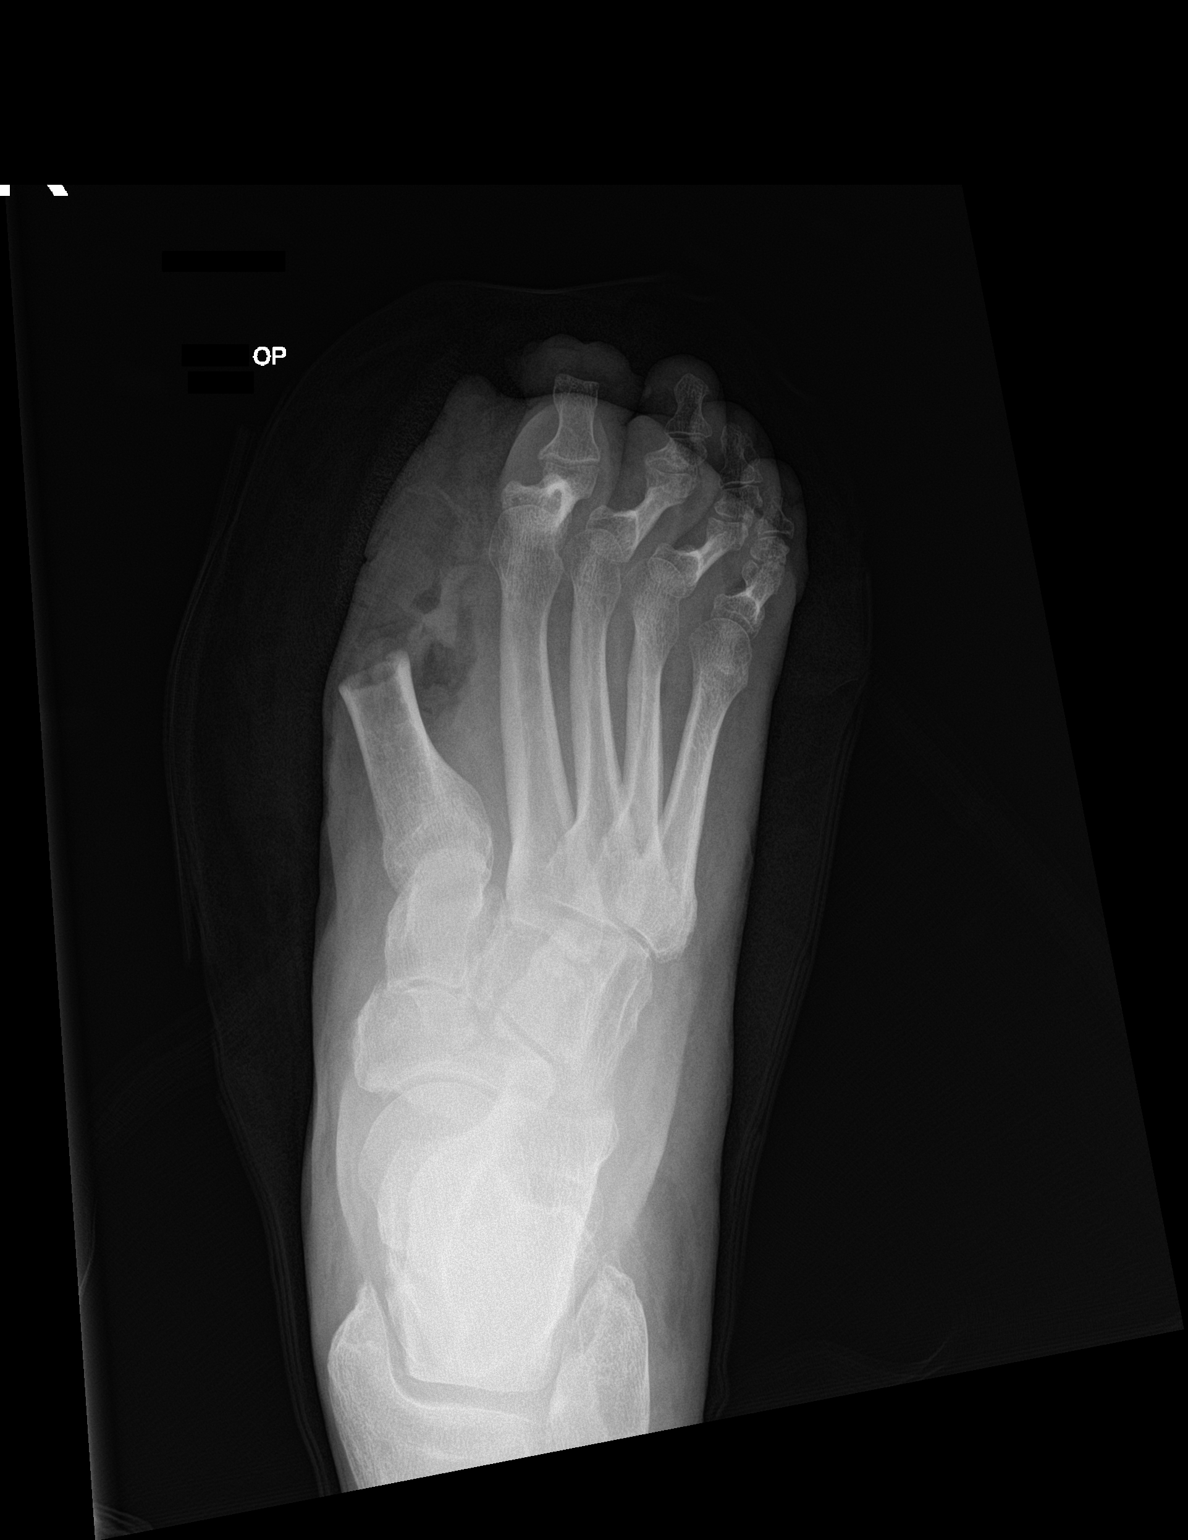

[foot lat]
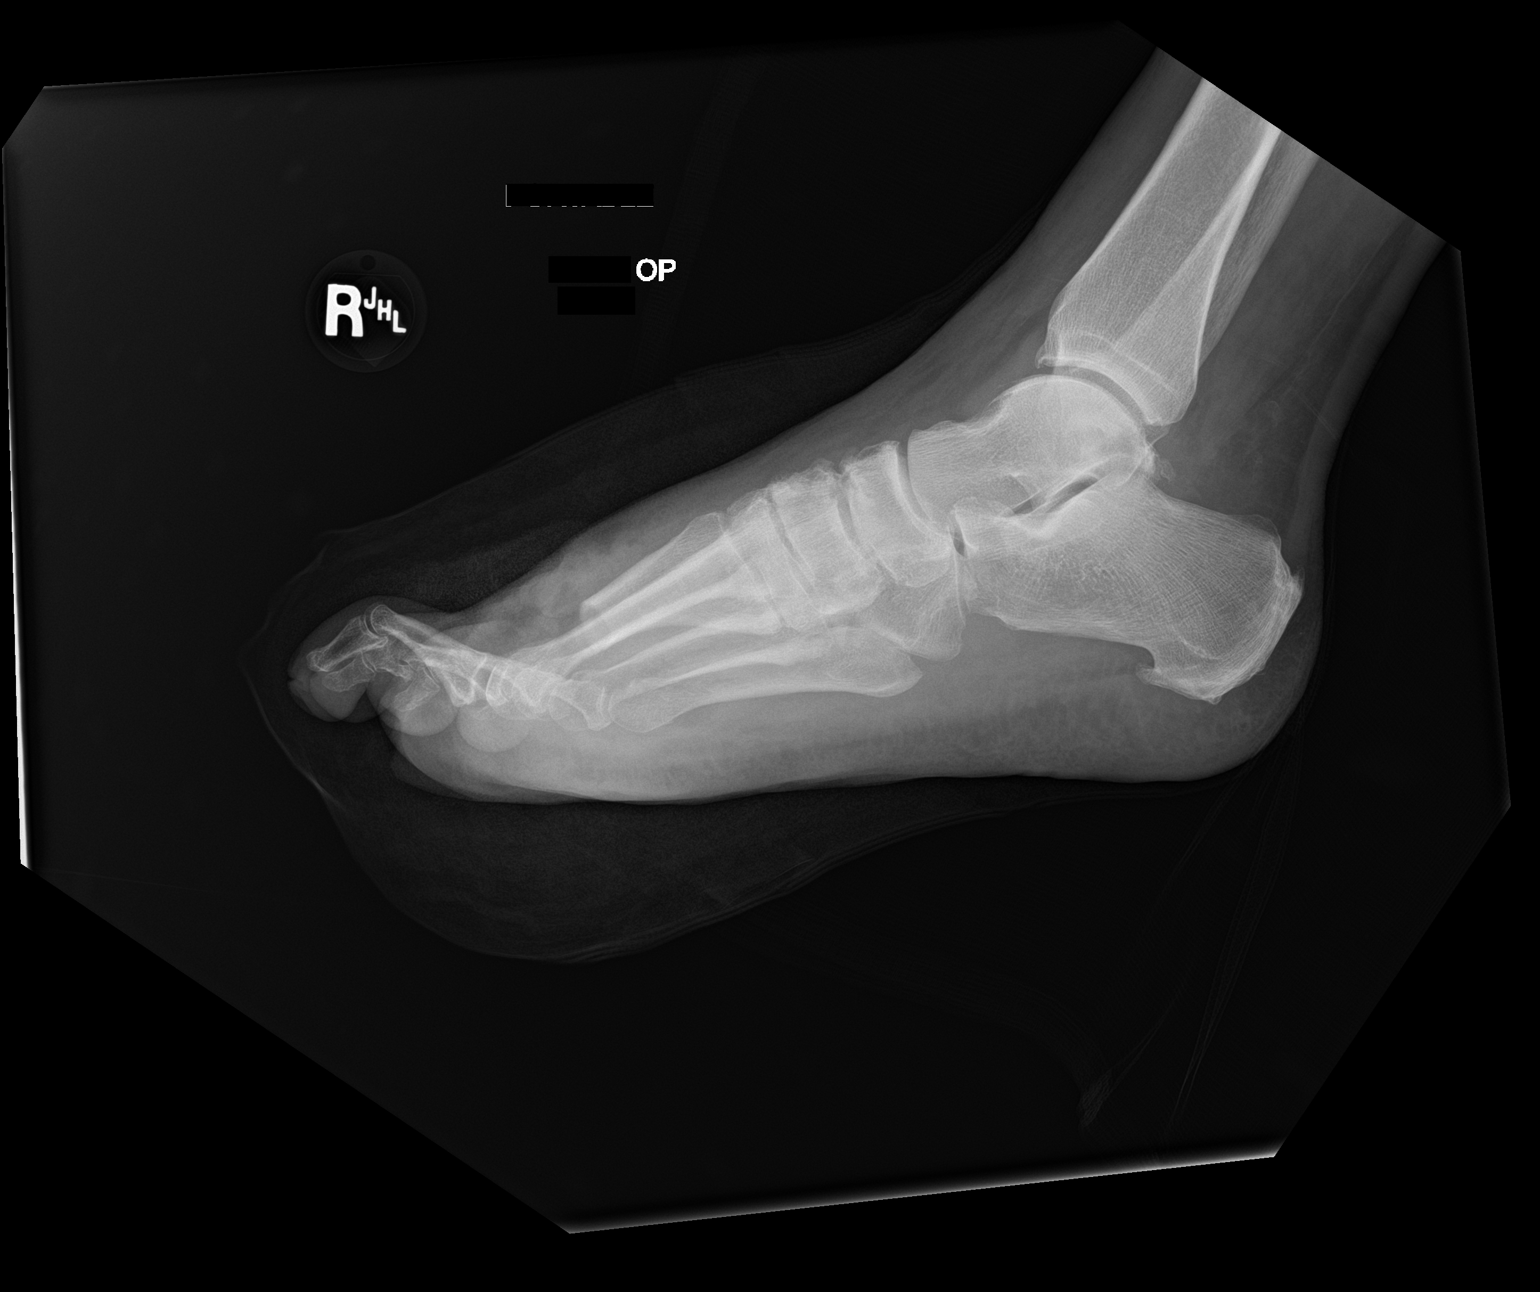

[2 of 2 positions shown; findings below may reference images not displayed]

FINDINGS: Interval amputation of the right first toe at the mid metatarsal
level. Interval amputation of the right second toe at the distal
interphalangeal joint level. Margins of resection appear intact.
Soft tissue gas in the area of resection is consistent with recent
surgery. Degenerative changes in the intertarsal joints. Small
calcaneal spurs.
IMPRESSION: Interval amputation of the right first toe at the mid metatarsal
level and of the right second toe at the distal interphalangeal
joint level.

## 2021-08-09 IMAGING — MR MR FOOT*L* W/O CM
5 series · 40 of 40 positions shown · non-contrast
Comparison: X-ray [DATE]

CLINICAL DATA: Diabetic foot ulcer

EXAM:
MRI OF THE LEFT FOOT WITHOUT CONTRAST
TECHNIQUE: Multiplanar, multisequence MR imaging of the left forefoot was
performed. No intravenous contrast was administered.

[Series 4: T1 · oblique · left · 3.0mm · 0.47mm/px · 9 of 40 slices shown (1 of 2)]
[im 1/40]
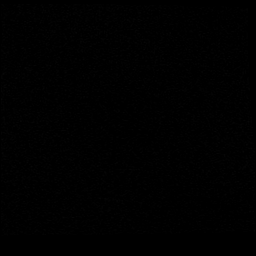
[im 5/40]
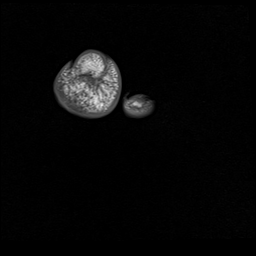
[im 10/40]
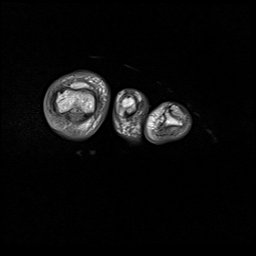
[im 15/40]
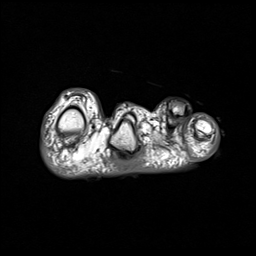
[im 20/40]
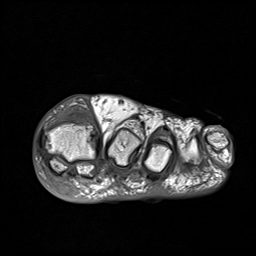
[im 25/40]
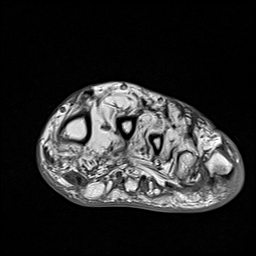
[im 30/40]
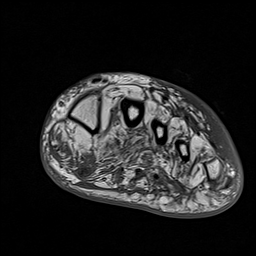
[im 35/40]
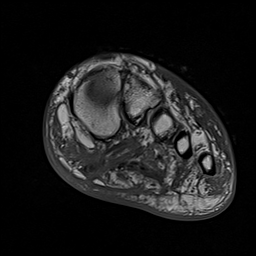
[im 40/40]
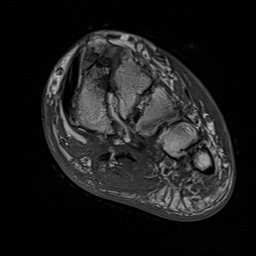

[Series 5: T2 fat-sat · oblique · left · 3.0mm · 0.38mm/px · 9 of 41 slices shown (1 of 2)]
[im 1/41]
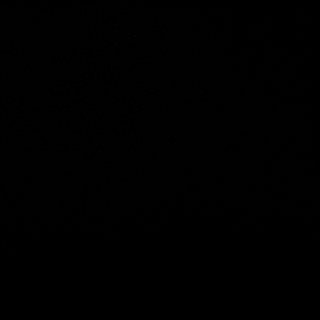
[im 6/41]
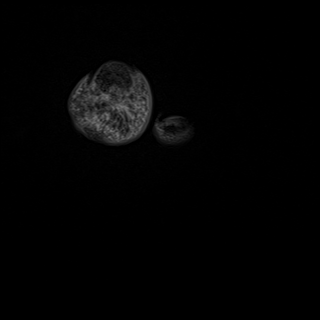
[im 11/41]
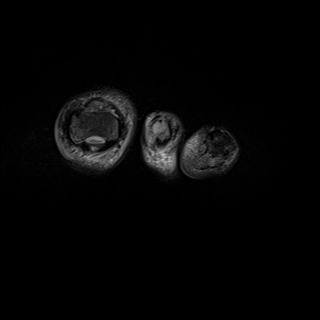
[im 16/41]
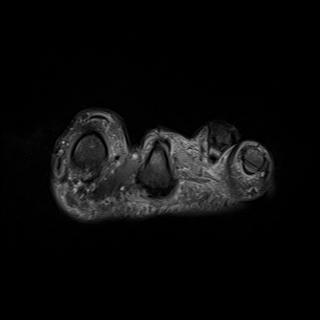
[im 21/41]
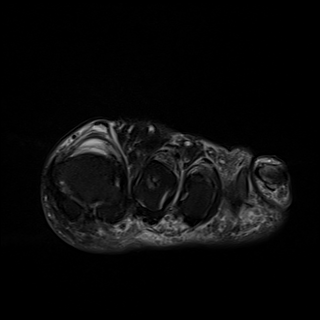
[im 26/41]
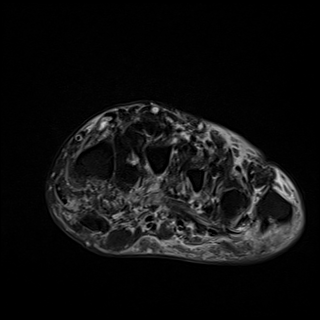
[im 31/41]
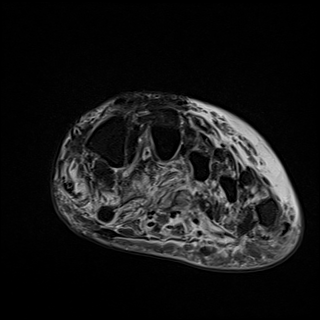
[im 36/41]
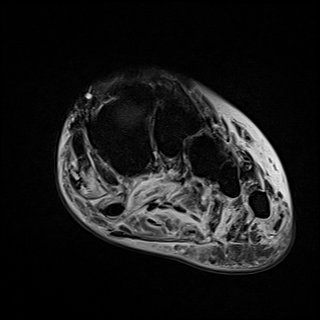
[im 41/41]
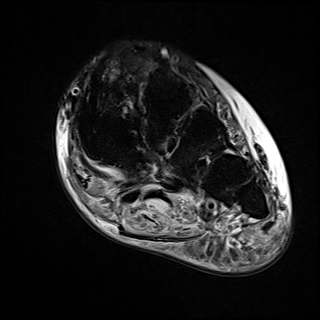

[Series 6: T2 fat-sat · oblique · left · 3.0mm · 0.70mm/px · 7 of 30 slices shown (2 of 2)]
[im 1/30]
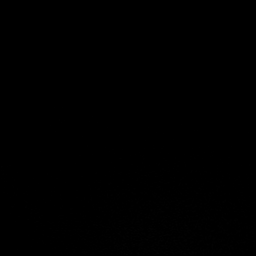
[im 5/30]
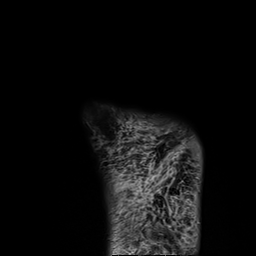
[im 10/30]
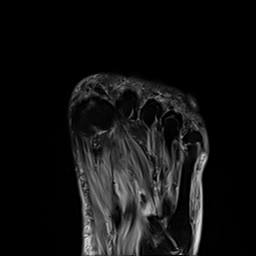
[im 15/30]
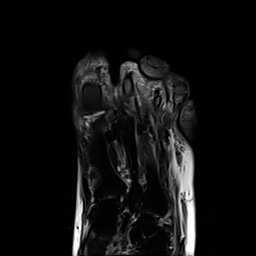
[im 20/30]
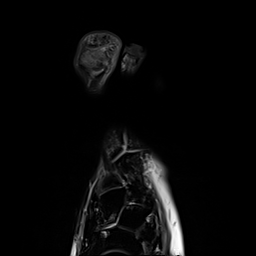
[im 25/30]
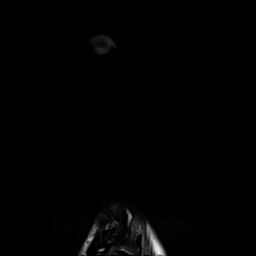
[im 30/30]
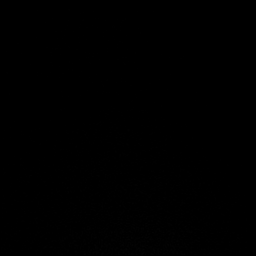

[Series 7: T1 · oblique · left · 3.0mm · 0.70mm/px · 7 of 30 slices shown (2 of 2)]
[im 1/30]
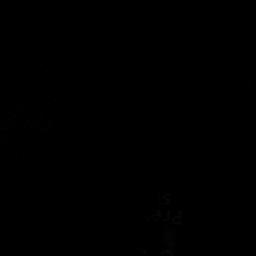
[im 5/30]
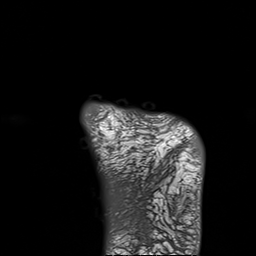
[im 10/30]
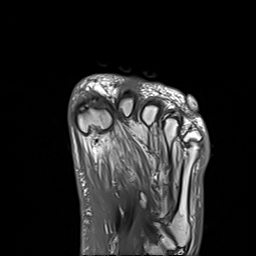
[im 15/30]
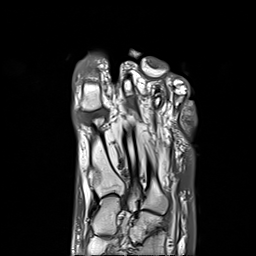
[im 20/30]
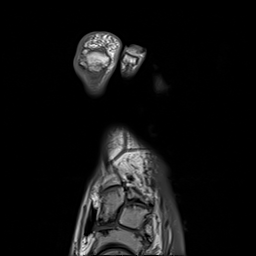
[im 25/30]
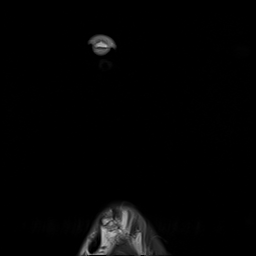
[im 30/30]
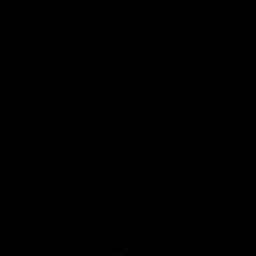

[Series 8: STIR · sagittal · left · 3.0mm · 0.35mm/px · 8 of 34 slices shown]
[im 1/34]
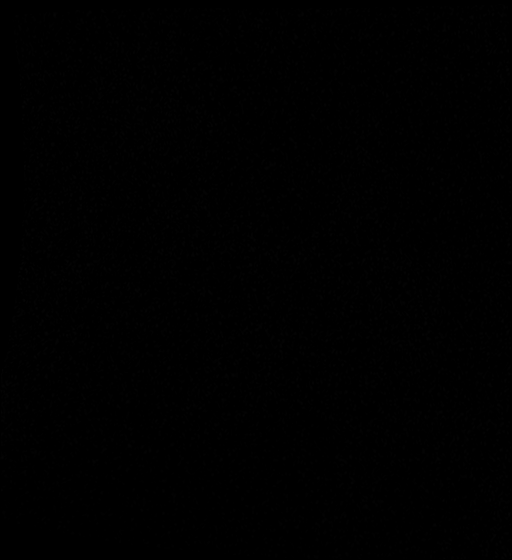
[im 5/34]
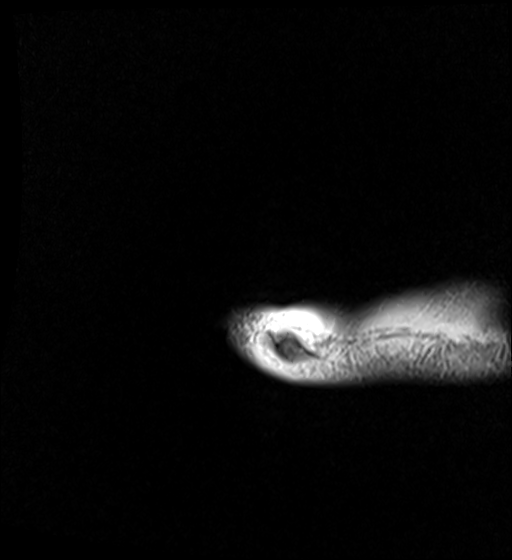
[im 10/34]
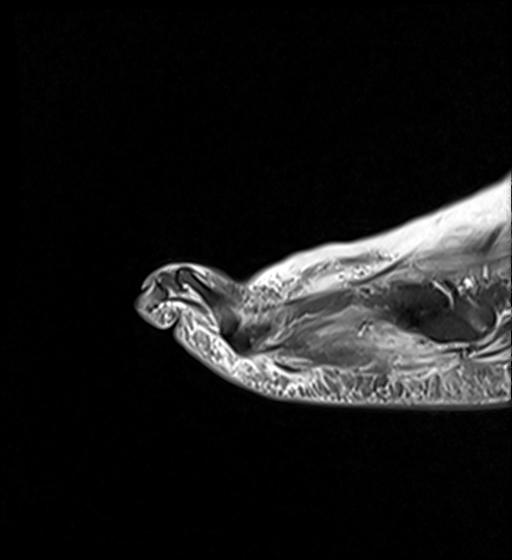
[im 15/34]
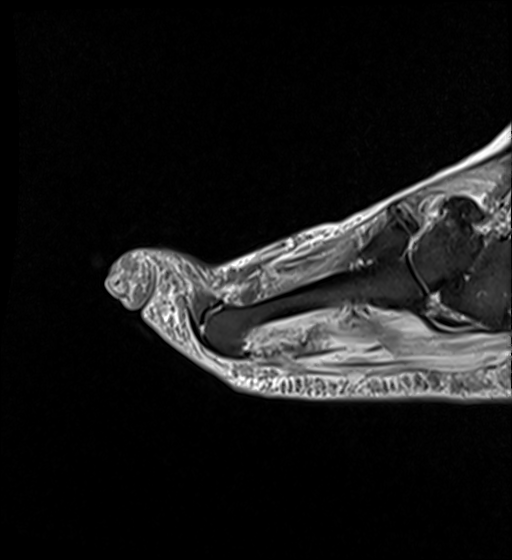
[im 19/34]
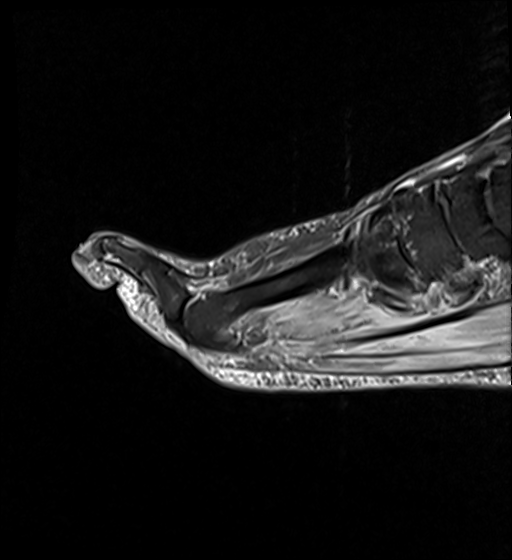
[im 24/34]
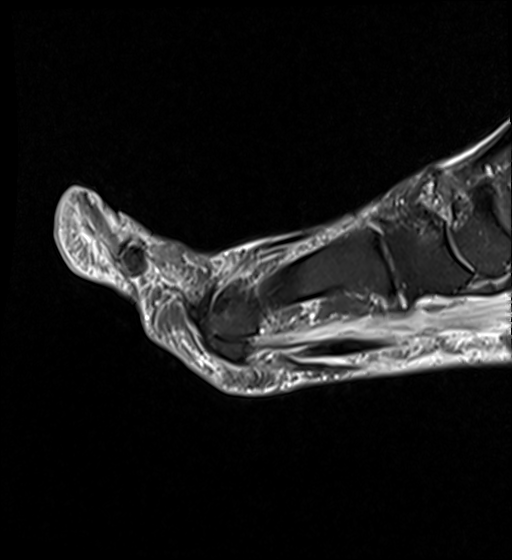
[im 29/34]
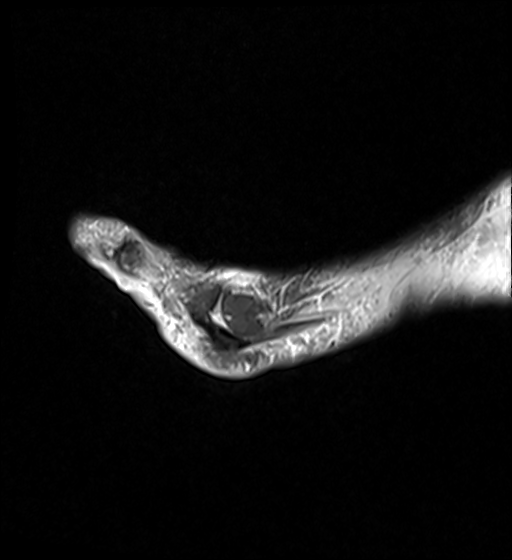
[im 34/34]
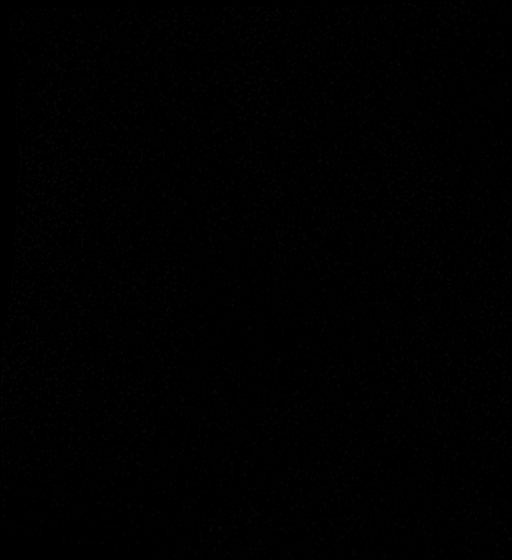

[40 of 40 positions shown; findings below may reference images not displayed]

FINDINGS: Bones/Joint/Cartilage

No acute fracture or dislocation. No bone marrow edema or
periostitis. No focal erosion or marrow replacement. The second toe
PIP joint is fused. Mild degenerative changes throughout the left
forefoot, most notably involving the first MTP joint. Moderate
arthropathy of the midfoot. Small first MTP joint effusion,
nonspecific.

Ligaments

Intact Lisfranc ligament. Collateral ligaments of the forefoot
appear intact.

Muscles and Tendons

Intact flexor and extensor tendons. Denervation changes of the
intrinsic foot musculature.

Soft tissues

Shallow soft tissue ulceration overlies the plantar aspect of the
great toe distal phalanx. Mild associated soft tissue swelling and
skin thickening. No organized fluid collection.
IMPRESSION: 1. No evidence of acute osteomyelitis of the left forefoot.
2. Shallow soft tissue ulceration overlies the plantar aspect of the
great toe distal phalanx. Mild associated soft tissue swelling and
skin thickening. No organized fluid collection.
3. Mild degenerative changes throughout the left forefoot, most
notably involving the first MTP joint. Moderate arthropathy of the
midfoot.

## 2021-08-09 SURGERY — AMPUTATION DIGIT
Anesthesia: Monitor Anesthesia Care | Laterality: Right

## 2021-08-09 MED ORDER — LIDOCAINE HCL 1 % IJ SOLN
INTRAMUSCULAR | Status: DC | PRN
  Administered 2021-08-09: 10 mL

## 2021-08-09 MED ORDER — PROPOFOL 10 MG/ML IV BOLUS
INTRAVENOUS | Status: DC | PRN
Start: 2021-08-09 — End: 2021-08-09
  Administered 2021-08-09 (×3): 20 mg via INTRAVENOUS

## 2021-08-09 MED ORDER — AMISULPRIDE (ANTIEMETIC) 5 MG/2ML IV SOLN: 10.0000 mg | Freq: Once | INTRAVENOUS | Status: DC | PRN

## 2021-08-09 MED ORDER — ENOXAPARIN SODIUM 60 MG/0.6ML IJ SOSY
60.0000 mg | PREFILLED_SYRINGE | INTRAMUSCULAR | Status: DC
  Administered 2021-08-09 – 2021-08-20 (×12): 60 mg via SUBCUTANEOUS
  Filled 2021-08-09 (×12): qty 0.6

## 2021-08-09 MED ORDER — PROPOFOL 500 MG/50ML IV EMUL
INTRAVENOUS | Status: DC | PRN
  Administered 2021-08-09: 125 ug/kg/min via INTRAVENOUS

## 2021-08-09 MED ORDER — VITAMIN D (ERGOCALCIFEROL) 1.25 MG (50000 UNIT) PO CAPS
50000.0000 [IU] | ORAL_CAPSULE | ORAL | Status: DC
  Administered 2021-08-16: 50000 [IU] via ORAL
  Filled 2021-08-09 (×2): qty 1

## 2021-08-09 MED ORDER — ACETAMINOPHEN 500 MG PO TABS
1000.0000 mg | ORAL_TABLET | Freq: Once | ORAL | Status: AC
  Administered 2021-08-09: 1000 mg via ORAL
  Filled 2021-08-09: qty 2

## 2021-08-09 MED ORDER — CEFAZOLIN SODIUM-DEXTROSE 2-3 GM-%(50ML) IV SOLR
INTRAVENOUS | Status: DC | PRN
  Administered 2021-08-09: 2 g via INTRAVENOUS

## 2021-08-09 MED ORDER — LIDOCAINE 2% (20 MG/ML) 5 ML SYRINGE
INTRAMUSCULAR | Status: DC | PRN
  Administered 2021-08-09: 100 mg via INTRAVENOUS

## 2021-08-09 MED ORDER — CEFAZOLIN SODIUM-DEXTROSE 2-4 GM/100ML-% IV SOLN
2.0000 g | Freq: Three times a day (TID) | INTRAVENOUS | Status: DC
  Administered 2021-08-09 – 2021-08-10 (×2): 2 g via INTRAVENOUS
  Filled 2021-08-09 (×3): qty 100

## 2021-08-09 MED ORDER — CELECOXIB 200 MG PO CAPS
200.0000 mg | ORAL_CAPSULE | Freq: Once | ORAL | Status: AC
  Administered 2021-08-09: 200 mg via ORAL
  Filled 2021-08-09: qty 1

## 2021-08-09 MED ORDER — LIDOCAINE HCL (PF) 1 % IJ SOLN
INTRAMUSCULAR | Status: AC
  Filled 2021-08-09: qty 30

## 2021-08-09 MED ORDER — CEFAZOLIN SODIUM-DEXTROSE 1-4 GM/50ML-% IV SOLN: 1.0000 g | Freq: Three times a day (TID) | INTRAVENOUS | Status: DC

## 2021-08-09 MED ORDER — PROMETHAZINE HCL 25 MG/ML IJ SOLN: 6.2500 mg | INTRAMUSCULAR | Status: DC | PRN

## 2021-08-09 MED ORDER — CEFAZOLIN SODIUM-DEXTROSE 2-4 GM/100ML-% IV SOLN
INTRAVENOUS | Status: AC
  Filled 2021-08-09: qty 100

## 2021-08-09 MED ORDER — CHLORHEXIDINE GLUCONATE 0.12 % MT SOLN
15.0000 mL | OROMUCOSAL | Status: AC
  Administered 2021-08-09: 15 mL via OROMUCOSAL

## 2021-08-09 MED ORDER — CHLORHEXIDINE GLUCONATE CLOTH 2 % EX PADS: 6.0000 | MEDICATED_PAD | Freq: Once | CUTANEOUS | Status: DC

## 2021-08-09 MED ORDER — LACTATED RINGERS IV SOLN: INTRAVENOUS | Status: DC

## 2021-08-09 MED ORDER — 0.9 % SODIUM CHLORIDE (POUR BTL) OPTIME
TOPICAL | Status: DC | PRN
  Administered 2021-08-09: 1000 mL

## 2021-08-09 MED ORDER — ONDANSETRON HCL 4 MG/2ML IJ SOLN
INTRAMUSCULAR | Status: DC | PRN
  Administered 2021-08-09: 4 mg via INTRAVENOUS

## 2021-08-09 MED ORDER — CHLORHEXIDINE GLUCONATE CLOTH 2 % EX PADS
6.0000 | MEDICATED_PAD | Freq: Once | CUTANEOUS | Status: DC
  Administered 2021-08-09: 6 via TOPICAL

## 2021-08-09 MED ORDER — BUPIVACAINE HCL (PF) 0.25 % IJ SOLN
INTRAMUSCULAR | Status: DC | PRN
  Administered 2021-08-09: 10 mL

## 2021-08-09 MED ORDER — BUPIVACAINE HCL (PF) 0.25 % IJ SOLN
INTRAMUSCULAR | Status: AC
  Filled 2021-08-09: qty 30

## 2021-08-09 MED ORDER — FENTANYL CITRATE PF 50 MCG/ML IJ SOSY: 25.0000 ug | PREFILLED_SYRINGE | INTRAMUSCULAR | Status: DC | PRN

## 2021-08-09 SURGICAL SUPPLY — 38 items
APL PRP STRL LF DISP 70% ISPRP (MISCELLANEOUS) ×1
BAG COUNTER SPONGE SURGICOUNT (BAG) IMPLANT
BAG SPNG CNTER NS LX DISP (BAG)
BLADE HEX COATED 2.75 (ELECTRODE) ×2 IMPLANT
BLADE OSCILLATING/SAGITTAL (BLADE) ×4
BLADE SW THK.38XMED LNG THN (BLADE) ×1 IMPLANT
BNDG CMPR 9X4 STRL LF SNTH (GAUZE/BANDAGES/DRESSINGS)
BNDG CONFORM 2 STRL LF (GAUZE/BANDAGES/DRESSINGS) ×1 IMPLANT
BNDG ELASTIC 3X5.8 VLCR STR LF (GAUZE/BANDAGES/DRESSINGS) ×2 IMPLANT
BNDG ESMARK 4X9 LF (GAUZE/BANDAGES/DRESSINGS) ×1 IMPLANT
BNDG GAUZE ELAST 4 BULKY (GAUZE/BANDAGES/DRESSINGS) ×2 IMPLANT
CHLORAPREP W/TINT 26 (MISCELLANEOUS) ×2 IMPLANT
ELECT REM PT RETURN 15FT ADLT (MISCELLANEOUS) ×2 IMPLANT
GAUZE PACKING IODOFORM 1/4X15 (PACKING) ×1 IMPLANT
GAUZE SPONGE 4X4 12PLY STRL (GAUZE/BANDAGES/DRESSINGS) ×2 IMPLANT
GLOVE SURG ENC MOIS LTX SZ7.5 (GLOVE) ×4 IMPLANT
GLOVE SURG UNDER POLY LF SZ7.5 (GLOVE) ×2 IMPLANT
GOWN STRL REUS W/ TWL LRG LVL3 (GOWN DISPOSABLE) ×1 IMPLANT
GOWN STRL REUS W/ TWL XL LVL3 (GOWN DISPOSABLE) ×1 IMPLANT
GOWN STRL REUS W/TWL LRG LVL3 (GOWN DISPOSABLE) ×2
GOWN STRL REUS W/TWL XL LVL3 (GOWN DISPOSABLE) ×2
KIT BASIN OR (CUSTOM PROCEDURE TRAY) ×2 IMPLANT
KIT TURNOVER KIT A (KITS) ×2 IMPLANT
NDL HYPO 25X1 1.5 SAFETY (NEEDLE) ×1 IMPLANT
NDL SAFETY ECLIPSE 18X1.5 (NEEDLE) IMPLANT
NEEDLE HYPO 18GX1.5 SHARP (NEEDLE)
NEEDLE HYPO 25X1 1.5 SAFETY (NEEDLE) ×2 IMPLANT
NS IRRIG 1000ML POUR BTL (IV SOLUTION) IMPLANT
PACK ORTHO EXTREMITY (CUSTOM PROCEDURE TRAY) IMPLANT
PENCIL SMOKE EVACUATOR (MISCELLANEOUS) IMPLANT
SPONGE SURGIFOAM ABS GEL 100 (HEMOSTASIS) ×1 IMPLANT
SUT ETHILON 3 0 PS 1 (SUTURE) ×4 IMPLANT
SUT MNCRL AB 3-0 PS2 18 (SUTURE) ×2 IMPLANT
SUT PROLENE 2 0 SH DA (SUTURE) IMPLANT
SUT VIC AB 3-0 SH 27 (SUTURE) ×2
SUT VIC AB 3-0 SH 27X BRD (SUTURE) IMPLANT
SYR 20ML LL LF (SYRINGE) ×2 IMPLANT
UNDERPAD 30X36 HEAVY ABSORB (UNDERPADS AND DIAPERS) ×2 IMPLANT

## 2021-08-09 NOTE — Anesthesia Preprocedure Evaluation (Addendum)
Anesthesia Evaluation  Patient identified by MRN, date of birth, ID band Patient awake    Reviewed: Allergy & Precautions, NPO status , Patient's Chart, lab work & pertinent test results, reviewed documented beta blocker date and time   Airway Mallampati: III  TM Distance: >3 FB Neck ROM: Full    Dental no notable dental hx. (+) Dental Advisory Given   Pulmonary sleep apnea ,    Pulmonary exam normal        Cardiovascular hypertension, Pt. on medications and Pt. on home beta blockers + CAD and + CABG  Normal cardiovascular exam     Neuro/Psych PSYCHIATRIC DISORDERS Anxiety negative neurological ROS     GI/Hepatic negative GI ROS, Neg liver ROS,   Endo/Other  diabetes  Renal/GU negative Renal ROS     Musculoskeletal negative musculoskeletal ROS (+)   Abdominal   Peds  Hematology negative hematology ROS (+)   Anesthesia Other Findings   Reproductive/Obstetrics                            Anesthesia Physical Anesthesia Plan  ASA: 3  Anesthesia Plan: MAC   Post-op Pain Management:    Induction:   PONV Risk Score and Plan: 1 and Ondansetron and Propofol infusion  Airway Management Planned: Natural Airway  Additional Equipment:   Intra-op Plan:   Post-operative Plan:   Informed Consent: I have reviewed the patients History and Physical, chart, labs and discussed the procedure including the risks, benefits and alternatives for the proposed anesthesia with the patient or authorized representative who has indicated his/her understanding and acceptance.     Dental advisory given  Plan Discussed with: Anesthesiologist and CRNA  Anesthesia Plan Comments:        Anesthesia Quick Evaluation

## 2021-08-09 NOTE — Progress Notes (Signed)
Due to my schedule and the OR schedule the patient is scheduled for 6pm tonight. Updated patients wife who is at bedside with the patient. No further questions.

## 2021-08-09 NOTE — Transfer of Care (Signed)
Immediate Anesthesia Transfer of Care Note  Patient: [name] [name]  Procedure(s) Performed: AMPUTATION DIGIT (Right)  Patient Location: PACU  Anesthesia Type:MAC  Level of Consciousness: drowsy and patient cooperative  Airway & Oxygen Therapy: Patient Spontanous Breathing and Patient connected to face mask oxygen  Post-op Assessment: Report given to RN and Post -op Vital signs reviewed and stable  Post vital signs: Reviewed and stable  Last Vitals:  Vitals Value Taken Time  BP    Temp    Pulse 73 08/09/21 1918  Resp 16 08/09/21 1918  SpO2 96 % 08/09/21 1918  Vitals shown include unvalidated device data.  Last Pain:  Vitals:   08/09/21 1625  TempSrc: Oral  PainSc: 0-No pain      Patients Stated Pain Goal: 2 (16/57/90 [id number])  Complications: No notable events documented.

## 2021-08-09 NOTE — Progress Notes (Signed)
PHARMACY - PHYSICIAN COMMUNICATION CRITICAL VALUE ALERT - BLOOD CULTURE IDENTIFICATION (BCID)  [name] [name] is an 65 y.o. male who presented to [facility] on 08/08/2021 with a chief complaint of right toe ulcer and LLE cellulitis.   Assessment:  Patient undergoing I&D and toe amputation today. ID following.   BCID + 1/4 streptococcus species. Not currently on antibiotics. ID note mentions starting cefazolin peri-operatively.   Name of physician (or Provider) Contacted: Dr. Elvera Lennox  Current antibiotics: None  Changes to prescribed antibiotics: Verbal order to initiate cefazolin at 8pm tonight   Results for orders placed or performed during the hospital encounter of 08/08/21  Blood Culture ID Panel (Reflexed) (Collected: 08/08/2021  1:07 PM)  Result Value Ref Range   Enterococcus faecalis NOT DETECTED NOT DETECTED   Enterococcus Faecium NOT DETECTED NOT DETECTED   Listeria monocytogenes NOT DETECTED NOT DETECTED   Staphylococcus species NOT DETECTED NOT DETECTED   Staphylococcus aureus (BCID) NOT DETECTED NOT DETECTED   Staphylococcus epidermidis NOT DETECTED NOT DETECTED   Staphylococcus lugdunensis NOT DETECTED NOT DETECTED   Streptococcus species DETECTED (A) NOT DETECTED   Streptococcus agalactiae NOT DETECTED NOT DETECTED   Streptococcus pneumoniae NOT DETECTED NOT DETECTED   Streptococcus pyogenes NOT DETECTED NOT DETECTED   A.calcoaceticus-baumannii NOT DETECTED NOT DETECTED   Bacteroides fragilis NOT DETECTED NOT DETECTED   Enterobacterales NOT DETECTED NOT DETECTED   Enterobacter cloacae complex NOT DETECTED NOT DETECTED   Escherichia coli NOT DETECTED NOT DETECTED   Klebsiella aerogenes NOT DETECTED NOT DETECTED   Klebsiella oxytoca NOT DETECTED NOT DETECTED   Klebsiella pneumoniae NOT DETECTED NOT DETECTED   Proteus species NOT DETECTED NOT DETECTED   Salmonella species NOT DETECTED NOT DETECTED   Serratia marcescens NOT DETECTED NOT DETECTED   Haemophilus  influenzae NOT DETECTED NOT DETECTED   Neisseria meningitidis NOT DETECTED NOT DETECTED   Pseudomonas aeruginosa NOT DETECTED NOT DETECTED   Stenotrophomonas maltophilia NOT DETECTED NOT DETECTED   Candida albicans NOT DETECTED NOT DETECTED   Candida auris NOT DETECTED NOT DETECTED   Candida glabrata NOT DETECTED NOT DETECTED   Candida krusei NOT DETECTED NOT DETECTED   Candida parapsilosis NOT DETECTED NOT DETECTED   Candida tropicalis NOT DETECTED NOT DETECTED   Cryptococcus neoformans/gattii NOT DETECTED NOT DETECTED    Cindi Carbon, PharmD 08/09/2021  6:04 PM

## 2021-08-09 NOTE — Progress Notes (Signed)
Patient declines the use of nocturnal CPAP tonight stating that he does not feel he is able to tolerate the difference between the feel of his own machine and the hospital supplied machine. Equipment remains at bedside on standby.

## 2021-08-09 NOTE — Brief Op Note (Signed)
08/09/2021  7:06 PM  PATIENT:  [name] [name]  65 y.o. male  PRE-OPERATIVE DIAGNOSIS:  Osteomyelitis  POST-OPERATIVE DIAGNOSIS:  Osteomyelitis  PROCEDURE:  Partial 1st ray amputation right foot; partial 2nd toe amputation, I&D deep abscess right foot.   SURGEON:  Surgeon(s) and Role:    * Donnamae Muilenburg, Bonna Gains, DPM - Primary  PHYSICIAN ASSISTANT:   ASSISTANTS: none   ANESTHESIA:   MAC  EBL:  100 mL   BLOOD ADMINISTERED:none  DRAINS: none   LOCAL MEDICATIONS USED:  OTHER 20 cc lidocaine and marcaine plain  SPECIMEN:  Source of Specimen:  wound culture   DISPOSITION OF SPECIMEN:  PATHOLOGY  COUNTS:  YES  TOURNIQUET:  * Missing tourniquet times found for documented tourniquets in log: [id number] * Total Tourniquet Time Documented: Calf (Right) - 18 minutes Total: Calf (Right) - 18 minutes   DICTATION: .Viviann Spare Dictation  PLAN OF CARE: Admit to inpatient   PATIENT DISPOSITION:  PACU - hemodynamically stable.   Delay start of Pharmacological VTE agent (>24hrs) due to surgical blood loss or risk of bleeding: no  Intraoperative findings: Extensive purulence noted along the 1st digit going into the 1st interspace, dorsal midfoot and plantar midfoot with tracking. Culture obtained. After debridement no further purulence noted. Underwent partial 1st ray amputation and partial 2nd toe amputation right foot. Hemostasis achieved and wound closed with central iodoform packing. Will start ancef and await wound culture. Pending blood work and clinical response may need to return to the OR Thursday vs Friday for repeat debridement.   Bedrest tonight but starting tomorrow will be NWB.

## 2021-08-09 NOTE — Progress Notes (Addendum)
Subjective: No new complaints   Antibiotics:  Anti-infectives (From admission, onward)    None       Medications: Scheduled Meds:  amLODipine  10 mg Oral QHS   aspirin  325 mg Oral Daily   buPROPion  300 mg Oral q morning   calcium carbonate  400 mg of elemental calcium Oral BID   enoxaparin (LOVENOX) injection  60 mg Subcutaneous Q24H   irbesartan  300 mg Oral Daily   And   hydrochlorothiazide  25 mg Oral Daily   insulin aspart  0-15 Units Subcutaneous TID WC   insulin aspart  0-5 Units Subcutaneous QHS   metoprolol tartrate  100 mg Oral BID   omega-3 acid ethyl esters  1 g Oral Daily   pantoprazole  40 mg Oral Daily   potassium chloride SA  40 mEq Oral q morning   Vitamin D (Ergocalciferol)  50,000 Units Oral Q7 days   Continuous Infusions: PRN Meds:.ALPRAZolam, oxyCODONE, traMADol    Objective: Weight change:   Intake/Output Summary (Last 24 hours) at 08/09/2021 1456 Last data filed at 08/09/2021 1143 Gross per 24 hour  Intake 120 ml  Output 2000 ml  Net -1880 ml   Blood pressure (!) 164/83, pulse 76, temperature 99.2 F (37.3 C), temperature source Oral, resp. rate 16, height 6' 0.5" (1.842 m), weight 118.6 kg, SpO2 98 %. Temp:  [98.2 F (36.8 C)-99.2 F (37.3 C)] 99.2 F (37.3 C) (09/20 1431) Pulse Rate:  [73-83] 76 (09/20 1431) Resp:  [16] 16 (09/20 0123) BP: (147-165)/(65-83) 164/83 (09/20 1431) SpO2:  [97 %-100 %] 98 % (09/20 1431) Weight:  [118.6 kg] 118.6 kg (09/19 1738)  Physical Exam: Physical Exam Constitutional:      Appearance: He is well-developed.  HENT:     Head: Normocephalic and atraumatic.  Eyes:     Extraocular Movements: Extraocular movements intact.     Conjunctiva/sclera: Conjunctivae normal.     Pupils: Pupils are equal, round, and reactive to light.  Cardiovascular:     Rate and Rhythm: Normal rate and regular rhythm.  Pulmonary:     Effort: Pulmonary effort is normal. No respiratory distress.     Breath  sounds: No wheezing.  Abdominal:     General: There is no distension.     Palpations: Abdomen is soft.  Musculoskeletal:        General: Normal range of motion.     Cervical back: Normal range of motion and neck supple.  Skin:    General: Skin is warm and dry.     Findings: Erythema present. No rash.  Neurological:     General: No focal deficit present.     Mental Status: He is alert and oriented to person, place, and time.  Psychiatric:        Mood and Affect: Mood normal.        Behavior: Behavior normal.        Thought Content: Thought content normal.        Judgment: Judgment normal.    Left lower extremity 08/08/2021       Left lower extremity 08/09/2021    Toes bandaged    CBC:    BMET Recent Labs    08/08/21 1307 08/08/21 1807 08/09/21 0546  NA 139  --  135  K 3.9  --  3.5  CL 102  --  99  CO2 24  --  25  GLUCOSE 153*  --  195*  BUN 35*  --  27*  CREATININE 1.63* 1.51* 1.21  CALCIUM 7.6* 6.9* 7.5*     Liver Panel  Recent Labs    08/08/21 1307 08/09/21 0546  PROT 7.3 6.4*  ALBUMIN 3.2* 2.7*  AST 63* 29  ALT 88* 64*  ALKPHOS 82 81  BILITOT 1.1 1.2       Sedimentation Rate Recent Labs    08/09/21 0546  ESRSEDRATE 44*   C-Reactive Protein Recent Labs    08/08/21 1307 08/09/21 0546  CRP 13.9* 16.9*    Micro Results: Recent Results (from the past 720 hour(s))  Blood Cultures x 2 sites     Status: None (Preliminary result)   Collection Time: 08/08/21  1:07 PM   Specimen: BLOOD  Result Value Ref Range Status   Specimen Description   Final    BLOOD RIGHT ANTECUBITAL Performed at [facility], [address], [place], [place] [address]    Special Requests   Final    BOTTLES DRAWN AEROBIC AND ANAEROBIC Blood Culture results may not be optimal due to an excessive volume of blood received in culture bottles Performed at [facility], [address] [place], [place] [address]    Culture    Final    NO GROWTH < 24 HOURS Performed at [facility], [address] [place], [place] [address]    Report Status PENDING  Incomplete  Blood Cultures x 2 sites     Status: None (Preliminary result)   Collection Time: 08/08/21  1:07 PM   Specimen: BLOOD  Result Value Ref Range Status   Specimen Description   Final    BLOOD BLOOD RIGHT FOREARM Performed at [facility], [address] [place], [place] [address]    Special Requests   Final    BOTTLES DRAWN AEROBIC AND ANAEROBIC Blood Culture results may not be optimal due to an excessive volume of blood received in culture bottles Performed at [facility], [address], [place], [place] [address]    Culture   Final    NO GROWTH < 24 HOURS Performed at [facility], [address], [place], [place] [address]    Report Status PENDING  Incomplete  Resp Panel by RT-PCR (Flu A&B, Covid) Nasopharyngeal Swab     Status: None   Collection Time: 08/08/21  3:00 PM   Specimen: Nasopharyngeal Swab; Nasopharyngeal(NP) swabs in vial transport medium  Result Value Ref Range Status   SARS Coronavirus 2 by RT PCR NEGATIVE NEGATIVE Final    Comment: (NOTE) SARS-CoV-2 target nucleic acids are NOT DETECTED.  The SARS-CoV-2 RNA is generally detectable in upper respiratory specimens during the acute phase of infection. The lowest concentration of SARS-CoV-2 viral copies this assay can detect is 138 copies/mL. A negative result does not preclude SARS-Cov-2 infection and should not be used as the sole basis for treatment or other patient management decisions. A negative result may occur with  improper specimen collection/handling, submission of specimen other than nasopharyngeal swab, presence of viral mutation(s) within the areas targeted by this assay, and inadequate number of viral copies(<138 copies/mL). A negative result must be combined with clinical observations, patient history, and  epidemiological information. The expected result is Negative.  Fact Sheet for Patients:  BloggerCourse.com  Fact Sheet for Healthcare Providers:  SeriousBroker.it  This test is no t yet approved or cleared by the [place] FDA and  has been authorized for detection and/or diagnosis  of SARS-CoV-2 by FDA under an Emergency Use Authorization (EUA). This EUA will remain  in effect (meaning this test can be used) for the duration of the COVID-19 declaration under Section 564(b)(1) of the Act, 21 U.S.C.section 360bbb-3(b)(1), unless the authorization is terminated  or revoked sooner.       Influenza A by PCR NEGATIVE NEGATIVE Final   Influenza B by PCR NEGATIVE NEGATIVE Final    Comment: (NOTE) The Xpert Xpress SARS-CoV-2/FLU/RSV plus assay is intended as an aid in the diagnosis of influenza from Nasopharyngeal swab specimens and should not be used as a sole basis for treatment. Nasal washings and aspirates are unacceptable for Xpert Xpress SARS-CoV-2/FLU/RSV testing.  Fact Sheet for Patients: BloggerCourse.com  Fact Sheet for Healthcare Providers: SeriousBroker.it  This test is not yet approved or cleared by the [place] FDA and has been authorized for detection and/or diagnosis of SARS-CoV-2 by FDA under an Emergency Use Authorization (EUA). This EUA will remain in effect (meaning this test can be used) for the duration of the COVID-19 declaration under Section 564(b)(1) of the Act, 21 U.S.C. section 360bbb-3(b)(1), unless the authorization is terminated or revoked.  Performed at [facility], [address], [place], [place] [address]     Studies/Results: MRI Right foot without contrast  Result Date: 08/08/2021 CLINICAL DATA:  Right great toe pain. EXAM: MRI OF THE RIGHT FOREFOOT WITHOUT CONTRAST TECHNIQUE: Multiplanar, multisequence MR imaging  of the right forefoot was performed. No intravenous contrast was administered. COMPARISON:  Right great toe x-rays from same day. Right foot x-rays dated July 13, 2021. FINDINGS: Bones/Joint/Cartilage Abnormal marrow edema with corresponding decreased T1 marrow signal involving the first proximal and distal phalanges. Similar signal abnormality involving the second distal phalanx. Pathologic fracture of the base of the first distal phalanx. No dislocation. Mild hallux valgus deformity and first MTP joint osteoarthritis. Large first IP joint effusion. Ligaments The first IP joint collateral ligaments are not visualized. Muscles and Tendons The flexor pollicis longus tendon is completely torn and retracted to the mid first metatarsal (series 9, image 23). Associated tenosynovitis. Increased T2 signal within and atrophy of the intrinsic muscles of the forefoot, nonspecific, but likely related to diabetic muscle changes. Soft tissue Diffuse soft tissue swelling. Ulceration at the plantar aspect of the first IP joint communicating with the joint space (series 9, image 23). 1.8 x 0.9 x 3.5 cm curvilinear gas and fluid collection along the dorsal and lateral aspect of the great toe, consistent with abscess (series 6, image 18). 1.8 x 1.1 x 1.1 cm fluid collection between the first and second metatarsals (series 7, image 15). Additional multi loculated fluid collection in the superficial dorsal foot overlying the second and third mid metatarsals, measuring 2.7 x 1.8 x 0.7 cm (series 9, image 13). Soft tissue ulceration at the tip of the second toe extending to bone. IMPRESSION: 1. Ulceration at the plantar aspect of the first IP joint communicating with the joint space with underlying first IP joint septic arthritis and osteomyelitis of the first proximal and distal phalanges. Pathologic fracture of the base of the first distal phalanx. 2. Ulceration at the tip of the second toe with underlying osteomyelitis of the second  distal phalanx. 3. Complete tear of the flexor pollicis longus tendon with retraction to the mid first metatarsal. Associated infectious tenosynovitis. 4. Multiple forefoot abscesses as described above, the largest a curvilinear abscess involving the dorsal and lateral aspect of the great toe, measuring up to 3.5 cm.  Electronically Signed   By: Obie Dredge M.D.   On: 08/08/2021 17:37   DG Foot Complete Left  Result Date: 08/08/2021 CLINICAL DATA:  Ulcer at the base of the first toe. EXAM: LEFT FOOT - COMPLETE 3+ VIEW COMPARISON:  MRI right toe 08/08/2021.  Right toe x-ray 08/08/2021. FINDINGS: There is soft tissue swelling of the first toe. There is no radiopaque foreign body. There is no underlying cortical erosion. There is no acute fracture or dislocation. Bony fusion is identified across the second proximal interphalangeal joint. Plantar and posterior calcaneal spurs are present. There are some nonspecific soft tissue calcifications along the plantar surface of the calcaneus. There is also degenerative spurring of the dorsal midfoot. IMPRESSION: 1. Soft tissue swelling of the left first toe. 2. No acute bony abnormality. 3. Degenerative changes of the left foot. Electronically Signed   By: Darliss Cheney M.D.   On: 08/08/2021 20:31   DG Toe Great Right  Result Date: 08/08/2021 CLINICAL DATA:  First toe pain and underlying infection EXAM: RIGHT GREAT TOE COMPARISON:  07/13/2021 FINDINGS: There is now seen considerable lucency within the base of the first distal phalanx as well as fragmentation. Soft tissue wound is again identified. Soft tissue wound is also noted at the tip of the second distal phalanx with some suspicious findings of bony erosion. These findings are both consistent with osteomyelitis. The lucency seen previously at the medial base of the first proximal phalanx is again seen and stable. Hallux valgus deformity is again noted. IMPRESSION: Increased lucency and fragmentation at the base  of the first distal phalanx consistent with osteomyelitis. Overlying soft tissue wound is again seen. A wound is noted at the tip of the second toe as well as some bony erosive changes in the tip of the distal phalanx suspicious for osteomyelitis. Electronically Signed   By: Alcide Clever M.D.   On: 08/08/2021 15:58   VAS [place] ABI WITH/WO TBI  Result Date: 08/09/2021  LOWER EXTREMITY DOPPLER STUDY Patient Name:  ZEANDRE ZELINSKI  Date of Exam:   08/09/2021 Medical Rec #: [id number]       Accession #:    [id number] Date of Birth: 01/16/1956       Patient Gender: M Patient Age:   72 years Exam Location:  [facility] Procedure:      VAS [place] ABI WITH/WO TBI Referring Phys: Kage Willmann VAN DAM --------------------------------------------------------------------------------  Indications: Ulceration, and Bilateral DM toe ulcers/infection. High Risk Factors: Hypertension, hyperlipidemia, Diabetes, no history of                    smoking. Other Factors: CAD S/P CABGx5.  Comparison Study: No previous exams Performing Technologist: Hill, Jody RVT, RDMS  Examination Guidelines: A complete evaluation includes at minimum, Doppler waveform signals and systolic blood pressure reading at the level of bilateral brachial, anterior tibial, and posterior tibial arteries, when vessel segments are accessible. Bilateral testing is considered an integral part of a complete examination. Photoelectric Plethysmograph (PPG) waveforms and toe systolic pressure readings are included as required and additional duplex testing as needed. Limited examinations for reoccurring indications may be performed as noted.  ABI Findings: +---------+------------------+-----+---------+--------+ Right    Rt Pressure (mmHg)IndexWaveform Comment  +---------+------------------+-----+---------+--------+ Brachial 163                    triphasic         +---------+------------------+-----+---------+--------+ PTA      200  1.22  triphasic         +---------+------------------+-----+---------+--------+ DP       209               1.27 triphasic         +---------+------------------+-----+---------+--------+ Great Toe186               1.13 Normal            +---------+------------------+-----+---------+--------+ +---------+------------------+-----+---------+-------+ Left     Lt Pressure (mmHg)IndexWaveform Comment +---------+------------------+-----+---------+-------+ Brachial 164                    triphasic        +---------+------------------+-----+---------+-------+ PTA      194               1.18 triphasic        +---------+------------------+-----+---------+-------+ DP       176               1.07 triphasic        +---------+------------------+-----+---------+-------+ Great Toe121               0.74 Normal           +---------+------------------+-----+---------+-------+ +-------+-----------+-----------+------------+------------+ ABI/TBIToday's ABIToday's TBIPrevious ABIPrevious TBI +-------+-----------+-----------+------------+------------+ Right  1.27       1.13                                +-------+-----------+-----------+------------+------------+ Left   1.18       0.74                                +-------+-----------+-----------+------------+------------+  For TBIs - 2nd digit used due to both great toes having open wounds and bandaged.  Summary: Right: Resting right ankle-brachial index is within normal range. No evidence of significant right lower extremity arterial disease. The right toe-brachial index is normal. Left: Resting left ankle-brachial index is within normal range. No evidence of significant left lower extremity arterial disease. The left toe-brachial index is normal.  *See table(s) above for measurements and observations.     Preliminary    US Abdomen Limited RUQ (LIVER/GB)  Result Date: 08/08/2021 CLINICAL DATA:  Elevated LFTs EXAM: ULTRASOUND  ABDOMEN LIMITED RIGHT UPPER QUADRANT COMPARISON:  None. FINDINGS: Gallbladder: Gallbladder is well distended with multiple gallstones within. Negative sonographic Murphy's sign is elicited. No pericholecystic fluid or wall thickening is noted. Common bile duct: Diameter: 3.4 mm. Liver: No focal lesion identified. Within normal limits in parenchymal echogenicity. Portal vein is patent on color Doppler imaging with normal direction of blood flow towards the liver. Other: None. IMPRESSION: Cholelithiasis without complicating factors. Electronically Signed   By: Alcide Clever M.D.   On: 08/08/2021 22:09      Assessment/Plan:  INTERVAL HISTORY: patient to have surgery tonight   Active Problems:   Toe infection    [name] [name] is a 65 y.o. male with diabetes mellitus obesity, coronary artery disease who has been struggling with bilateral diabetic foot ulcers now admitted with acute worsening of drainage from his right great toe also with onset of erythema consistent with cellulitis in his left lower extremity.  Plain films of shown osteomyelitis in the still phalanx of the great toe and second toe on the right.  RI shows the ulceration that is obvious on exam with communication of the joint space and first IP  joint with septic arthritis and osteomyelitis of first proximal and distal phalanges with pathological fracture of the base of the first distal phalanx with ulceration of the tip of the second toe with underlying osteomyelitis the second distal phalanx tear of the flexor pollicis longus and infectious tenosynovitis with multiple forefoot abscess  He also has an ulcer in his left great toe as well and MRI is pending of this area.  #1 Right Diabetic foot infection with multiple foot abscesses and osteomyelitis involving the first and second toe:  Patient going to the operating room tonight for first toe amputation and partial second toe amputation with I&D of abscesses which could be sent for  culture  #2 Left foot ulcer: Plain films do not show osteomyelitis but MRI is pending  #3 Cellulitis of LLE: will start ancef peri-operatively unless his condition worsens.   I spent 387  minutes with the patient including face to face counseling of the patient re his osteomyelitis, his cellulitis, personally reviewing Xrays of feet, MRI of right foot, along with blood cultures, CMP, CBC w differential, review of medical records before and during the visit and in coordination of his care.    LOS: 1 day   Acey Lav 08/09/2021, 2:56 PM

## 2021-08-09 NOTE — Progress Notes (Signed)
Patient seen in pre-op. Scheduled for right 1st toe amputation, partial 2nd toe amputation. We reviewed MRI of the left and right foot today. Reviewed arterial studies. Will plan for surgery as scheduled. He has no further questions/concerns. Consent signed. Will update his wife after surgery who is waiting.   Ovid Curd, DPM

## 2021-08-09 NOTE — Progress Notes (Signed)
ABI has been completed.  Results can be found under chart review under CV PROC. 08/09/2021 10:25 AM Adeline Petitfrere RVT, RDMS

## 2021-08-09 NOTE — Progress Notes (Signed)
PROGRESS NOTE  [name] [name] KDX:[id number] DOB: May 23, 1956 DOA: 08/08/2021 PCP: Tally Joe, MD   LOS: 1 day   Brief Narrative / Interim history: 65 year old male with history of DM2, hypertension, hyperlipidemia comes into the hospital with left lower extremity cellulitis as well as right great toe pain/nonhealing wound.  He has been dealing with nonhealing diabetic foot ulcer over the last couple years and follows in the wound center.  In the past week he has had worsening erythema, pain and foul-smelling discharge in the right toe, was in the wound care center and they told him to come to the hospital for IV antibiotics.  Podiatry as well as infectious disease were consulted upon his admission  Subjective / 24h Interval events: Is doing well this morning, denies any fevers, denies any chills.  No abdominal pain, no nausea or vomiting.  Awaiting surgery  Assessment & Plan: Principal Problem Right foot ulcer-an MRI on admission of the right foot was concerning for septic arthritis and osteomyelitis of the first proximal and distal phalanges, as well as osteomyelitis at the second distal phalanx.  It also showed infectious tenosynovitis around the first metatarsal, and multiple forefoot abscesses up to 3.5 cm -He is being monitored off antibiotics, podiatry consulted and he will be taken to the operating room for debridement/toe amputation later today -ID following as well  Active Problems Left lower extremity cellulitis-hold antibiotics as above.  Initiate postoperatively  Left toe ulcer-MRI ordered on admission and pending  Elevated LFTs-asymptomatic, right upper quadrant ultrasound showed cholelithiasis.  Patient was unaware of this and has never had symptoms related to his gallbladder.  I have recommended he follows up with general surgery as an outpatient -LFTs improving today  Chronic kidney disease stage IIIa-Baseline creatinine 1.5-1.7 over the last 7 years.  Currently better  than baseline at 1.2.  Essential hypertension-continue home medications with amlodipine, HCTZ, metoprolol, irbesartan.  Blood pressure still on the high side, continue to monitor  Hyperlipidemia-hold statin due to elevated LFTs  Hypercalcemia-seems to be vitamin D deficient, add supplements  Type 2 diabetes mellitus-continue sliding scale insulin, NPO today  CBG (last 3)  Recent Labs    08/08/21 1010 08/08/21 2201 08/09/21 0727  GLUCAP 111* 190* 185*    Scheduled Meds:  amLODipine  10 mg Oral QHS   aspirin  325 mg Oral Daily   buPROPion  300 mg Oral q morning   calcium carbonate  400 mg of elemental calcium Oral BID   enoxaparin (LOVENOX) injection  60 mg Subcutaneous Q24H   irbesartan  300 mg Oral Daily   And   hydrochlorothiazide  25 mg Oral Daily   insulin aspart  0-15 Units Subcutaneous TID WC   insulin aspart  0-5 Units Subcutaneous QHS   metoprolol tartrate  100 mg Oral BID   omega-3 acid ethyl esters  1 g Oral Daily   pantoprazole  40 mg Oral Daily   potassium chloride SA  40 mEq Oral q morning   Continuous Infusions: PRN Meds:.ALPRAZolam, oxyCODONE, traMADol  Diet Orders (From admission, onward)     Start     Ordered   08/09/21 1124  Diet NPO time specified  Diet effective now        08/09/21 1123            DVT prophylaxis:      Code Status: Full Code  Family Communication: No family at bedside  Status is: Inpatient  Remains inpatient appropriate because:Inpatient level of care appropriate due  to severity of illness  Dispo: The patient is from: Home              Anticipated d/c is to: Home              Patient currently is not medically stable to d/c.   Difficult to place patient No  Level of care: Telemetry  Consultants:  ID Podiatry   Procedures:  None   Microbiology  Blood cultures 9/19-no growth to date  Antimicrobials: None   Objective: Vitals:   08/08/21 1415 08/08/21 1738 08/08/21 2108 08/09/21 0123  BP: (!) 162/82  (!) 147/65 (!) 165/78 (!) 151/83  Pulse: 74 78 83 73  Resp: 18  16 16   Temp:  99.1 F (37.3 C) 98.5 F (36.9 C) 98.2 F (36.8 C)  TempSrc:  Oral Oral Oral  SpO2: 100% 100% 97% 98%  Weight:  118.6 kg    Height:  6' 0.5" (1.842 m)      Intake/Output Summary (Last 24 hours) at 08/09/2021 1126 Last data filed at 08/09/2021 0804 Gross per 24 hour  Intake 120 ml  Output 1500 ml  Net -1380 ml   Filed Weights   08/08/21 1149 08/08/21 1738  Weight: 112.9 kg 118.6 kg    Examination:  Constitutional: NAD Eyes: no scleral icterus ENMT: Mucous membranes are moist.  Neck: normal, supple Respiratory: clear to auscultation bilaterally, no wheezing, no crackles. Normal respiratory effort. No accessory muscle use.  Cardiovascular: Regular rate and rhythm, no murmurs / rubs / gallops. No LE edema. Abdomen: non distended, no tenderness. Bowel sounds positive.  Musculoskeletal: no clubbing / cyanosis.  Skin: As below Neurologic: Nonfocal        Data Reviewed: I have independently reviewed following labs and imaging studies   CBC: Recent Labs  Lab 08/08/21 1307 08/08/21 1807 08/09/21 0546  WBC 19.2* 18.0* 20.2*  NEUTROABS 17.0*  --   --   HGB 13.4 12.0* 12.5*  HCT 40.3 35.2* 36.9*  MCV 87.6 85.9 85.2  PLT 295 277 255   Basic Metabolic Panel: Recent Labs  Lab 08/08/21 1307 08/08/21 1807 08/09/21 0546  NA 139  --  135  K 3.9  --  3.5  CL 102  --  99  CO2 24  --  25  GLUCOSE 153*  --  195*  BUN 35*  --  27*  CREATININE 1.63* 1.51* 1.21  CALCIUM 7.6* 6.9* 7.5*   Liver Function Tests: Recent Labs  Lab 08/08/21 1307 08/09/21 0546  AST 63* 29  ALT 88* 64*  ALKPHOS 82 81  BILITOT 1.1 1.2  PROT 7.3 6.4*  ALBUMIN 3.2* 2.7*   Coagulation Profile: No results for input(s): INR, PROTIME in the last 168 hours. HbA1C: Recent Labs    08/08/21 1807  HGBA1C 6.2*   CBG: Recent Labs  Lab 08/08/21 1010 08/08/21 2201 08/09/21 0727  GLUCAP 111* 190* 185*     Recent Results (from the past 240 hour(s))  Blood Cultures x 2 sites     Status: None (Preliminary result)   Collection Time: 08/08/21  1:07 PM   Specimen: BLOOD  Result Value Ref Range Status   Specimen Description   Final    BLOOD RIGHT ANTECUBITAL Performed at [facility], [address] [place], [place]    Special Requests   Final    BOTTLES DRAWN AEROBIC AND ANAEROBIC Blood Culture results may not be optimal due to an excessive volume of blood received in culture  bottles Performed at [facility], [address], [place], [place] [address]    Culture   Final    NO GROWTH < 24 HOURS Performed at [facility], [address] [place], [place] [address]    Report Status PENDING  Incomplete  Blood Cultures x 2 sites     Status: None (Preliminary result)   Collection Time: 08/08/21  1:07 PM   Specimen: BLOOD  Result Value Ref Range Status   Specimen Description   Final    BLOOD BLOOD RIGHT FOREARM Performed at [facility], [address], [place], [place] [address]    Special Requests   Final    BOTTLES DRAWN AEROBIC AND ANAEROBIC Blood Culture results may not be optimal due to an excessive volume of blood received in culture bottles Performed at [facility], [address] [place], [place] [address]    Culture   Final    NO GROWTH < 24 HOURS Performed at [facility], [address], [place], [place] [address]    Report Status PENDING  Incomplete  Resp Panel by RT-PCR (Flu A&B, Covid) Nasopharyngeal Swab     Status: None   Collection Time: 08/08/21  3:00 PM   Specimen: Nasopharyngeal Swab; Nasopharyngeal(NP) swabs in vial transport medium  Result Value Ref Range Status   SARS Coronavirus 2 by RT PCR NEGATIVE NEGATIVE Final    Comment: (NOTE) SARS-CoV-2 target nucleic acids are NOT DETECTED.  The SARS-CoV-2 RNA is generally detectable in upper respiratory specimens  during the acute phase of infection. The lowest concentration of SARS-CoV-2 viral copies this assay can detect is 138 copies/mL. A negative result does not preclude SARS-Cov-2 infection and should not be used as the sole basis for treatment or other patient management decisions. A negative result may occur with  improper specimen collection/handling, submission of specimen other than nasopharyngeal swab, presence of viral mutation(s) within the areas targeted by this assay, and inadequate number of viral copies(<138 copies/mL). A negative result must be combined with clinical observations, patient history, and epidemiological information. The expected result is Negative.  Fact Sheet for Patients:  BloggerCourse.com  Fact Sheet for Healthcare Providers:  SeriousBroker.it  This test is no t yet approved or cleared by the [place] FDA and  has been authorized for detection and/or diagnosis of SARS-CoV-2 by FDA under an Emergency Use Authorization (EUA). This EUA will remain  in effect (meaning this test can be used) for the duration of the COVID-19 declaration under Section 564(b)(1) of the Act, 21 U.S.C.section 360bbb-3(b)(1), unless the authorization is terminated  or revoked sooner.       Influenza A by PCR NEGATIVE NEGATIVE Final   Influenza B by PCR NEGATIVE NEGATIVE Final    Comment: (NOTE) The Xpert Xpress SARS-CoV-2/FLU/RSV plus assay is intended as an aid in the diagnosis of influenza from Nasopharyngeal swab specimens and should not be used as a sole basis for treatment. Nasal washings and aspirates are unacceptable for Xpert Xpress SARS-CoV-2/FLU/RSV testing.  Fact Sheet for Patients: BloggerCourse.com  Fact Sheet for Healthcare Providers: SeriousBroker.it  This test is not yet approved or cleared by the [place] FDA and has been authorized for detection  and/or diagnosis of SARS-CoV-2 by FDA under an Emergency Use Authorization (EUA). This EUA will remain in effect (meaning this test can be used) for the duration of the COVID-19 declaration under Section 564(b)(1) of the Act, 21 U.S.C. section 360bbb-3(b)(1), unless the authorization  is terminated or revoked.  Performed at [facility], [address] [place], [place] [address]      Radiology Studies: MRI Right foot without contrast  Result Date: 08/08/2021 CLINICAL DATA:  Right great toe pain. EXAM: MRI OF THE RIGHT FOREFOOT WITHOUT CONTRAST TECHNIQUE: Multiplanar, multisequence MR imaging of the right forefoot was performed. No intravenous contrast was administered. COMPARISON:  Right great toe x-rays from same day. Right foot x-rays dated July 13, 2021. FINDINGS: Bones/Joint/Cartilage Abnormal marrow edema with corresponding decreased T1 marrow signal involving the first proximal and distal phalanges. Similar signal abnormality involving the second distal phalanx. Pathologic fracture of the base of the first distal phalanx. No dislocation. Mild hallux valgus deformity and first MTP joint osteoarthritis. Large first IP joint effusion. Ligaments The first IP joint collateral ligaments are not visualized. Muscles and Tendons The flexor pollicis longus tendon is completely torn and retracted to the mid first metatarsal (series 9, image 23). Associated tenosynovitis. Increased T2 signal within and atrophy of the intrinsic muscles of the forefoot, nonspecific, but likely related to diabetic muscle changes. Soft tissue Diffuse soft tissue swelling. Ulceration at the plantar aspect of the first IP joint communicating with the joint space (series 9, image 23). 1.8 x 0.9 x 3.5 cm curvilinear gas and fluid collection along the dorsal and lateral aspect of the great toe, consistent with abscess (series 6, image 18). 1.8 x 1.1 x 1.1 cm fluid collection between the first and second  metatarsals (series 7, image 15). Additional multi loculated fluid collection in the superficial dorsal foot overlying the second and third mid metatarsals, measuring 2.7 x 1.8 x 0.7 cm (series 9, image 13). Soft tissue ulceration at the tip of the second toe extending to bone. IMPRESSION: 1. Ulceration at the plantar aspect of the first IP joint communicating with the joint space with underlying first IP joint septic arthritis and osteomyelitis of the first proximal and distal phalanges. Pathologic fracture of the base of the first distal phalanx. 2. Ulceration at the tip of the second toe with underlying osteomyelitis of the second distal phalanx. 3. Complete tear of the flexor pollicis longus tendon with retraction to the mid first metatarsal. Associated infectious tenosynovitis. 4. Multiple forefoot abscesses as described above, the largest a curvilinear abscess involving the dorsal and lateral aspect of the great toe, measuring up to 3.5 cm. Electronically Signed   By: Obie Dredge M.D.   On: 08/08/2021 17:37   DG Foot Complete Left  Result Date: 08/08/2021 CLINICAL DATA:  Ulcer at the base of the first toe. EXAM: LEFT FOOT - COMPLETE 3+ VIEW COMPARISON:  MRI right toe 08/08/2021.  Right toe x-ray 08/08/2021. FINDINGS: There is soft tissue swelling of the first toe. There is no radiopaque foreign body. There is no underlying cortical erosion. There is no acute fracture or dislocation. Bony fusion is identified across the second proximal interphalangeal joint. Plantar and posterior calcaneal spurs are present. There are some nonspecific soft tissue calcifications along the plantar surface of the calcaneus. There is also degenerative spurring of the dorsal midfoot. IMPRESSION: 1. Soft tissue swelling of the left first toe. 2. No acute bony abnormality. 3. Degenerative changes of the left foot. Electronically Signed   By: Darliss Cheney M.D.   On: 08/08/2021 20:31   DG Toe Great Right  Result Date:  08/08/2021 CLINICAL DATA:  First toe pain and underlying infection EXAM: RIGHT GREAT TOE COMPARISON:  07/13/2021 FINDINGS: There is now seen considerable lucency within  the base of the first distal phalanx as well as fragmentation. Soft tissue wound is again identified. Soft tissue wound is also noted at the tip of the second distal phalanx with some suspicious findings of bony erosion. These findings are both consistent with osteomyelitis. The lucency seen previously at the medial base of the first proximal phalanx is again seen and stable. Hallux valgus deformity is again noted. IMPRESSION: Increased lucency and fragmentation at the base of the first distal phalanx consistent with osteomyelitis. Overlying soft tissue wound is again seen. A wound is noted at the tip of the second toe as well as some bony erosive changes in the tip of the distal phalanx suspicious for osteomyelitis. Electronically Signed   By: Alcide Clever M.D.   On: 08/08/2021 15:58   VAS [place] ABI WITH/WO TBI  Result Date: 08/09/2021  LOWER EXTREMITY DOPPLER STUDY Patient Name:  [name] [name]  Date of Exam:   08/09/2021 Medical Rec #: [id number]       Accession #:    [id number] Date of Birth: 1956-03-23       Patient Gender: M Patient Age:   39 years Exam Location:  [facility] Procedure:      VAS [place] ABI WITH/WO TBI Referring Phys: CORNELIUS VAN DAM --------------------------------------------------------------------------------  Indications: Ulceration, and Bilateral DM toe ulcers/infection. High Risk Factors: Hypertension, hyperlipidemia, Diabetes, no history of                    smoking. Other Factors: CAD S/P CABGx5.  Comparison Study: No previous exams Performing Technologist: Hill, Jody RVT, RDMS  Examination Guidelines: A complete evaluation includes at minimum, Doppler waveform signals and systolic blood pressure reading at the level of bilateral brachial, anterior tibial, and posterior tibial arteries, when vessel segments are  accessible. Bilateral testing is considered an integral part of a complete examination. Photoelectric Plethysmograph (PPG) waveforms and toe systolic pressure readings are included as required and additional duplex testing as needed. Limited examinations for reoccurring indications may be performed as noted.  ABI Findings: +---------+------------------+-----+---------+--------+ Right    Rt Pressure (mmHg)IndexWaveform Comment  +---------+------------------+-----+---------+--------+ Brachial 163                    triphasic         +---------+------------------+-----+---------+--------+ PTA      200               1.22 triphasic         +---------+------------------+-----+---------+--------+ DP       209               1.27 triphasic         +---------+------------------+-----+---------+--------+ Great Toe186               1.13 Normal            +---------+------------------+-----+---------+--------+ +---------+------------------+-----+---------+-------+ Left     Lt Pressure (mmHg)IndexWaveform Comment +---------+------------------+-----+---------+-------+ Brachial 164                    triphasic        +---------+------------------+-----+---------+-------+ PTA      194               1.18 triphasic        +---------+------------------+-----+---------+-------+ DP       176               1.07 triphasic        +---------+------------------+-----+---------+-------+ Reynolds Bowl  0.74 Normal           +---------+------------------+-----+---------+-------+ +-------+-----------+-----------+------------+------------+ ABI/TBIToday's ABIToday's TBIPrevious ABIPrevious TBI +-------+-----------+-----------+------------+------------+ Right  1.27       1.13                                +-------+-----------+-----------+------------+------------+ Left   1.18       0.74                                 +-------+-----------+-----------+------------+------------+  For TBIs - 2nd digit used due to both great toes having open wounds and bandaged.  Summary: Right: Resting right ankle-brachial index is within normal range. No evidence of significant right lower extremity arterial disease. The right toe-brachial index is normal. Left: Resting left ankle-brachial index is within normal range. No evidence of significant left lower extremity arterial disease. The left toe-brachial index is normal.  *See table(s) above for measurements and observations.     Preliminary    US Abdomen Limited RUQ (LIVER/GB)  Result Date: 08/08/2021 CLINICAL DATA:  Elevated LFTs EXAM: ULTRASOUND ABDOMEN LIMITED RIGHT UPPER QUADRANT COMPARISON:  None. FINDINGS: Gallbladder: Gallbladder is well distended with multiple gallstones within. Negative sonographic Murphy's sign is elicited. No pericholecystic fluid or wall thickening is noted. Common bile duct: Diameter: 3.4 mm. Liver: No focal lesion identified. Within normal limits in parenchymal echogenicity. Portal vein is patent on color Doppler imaging with normal direction of blood flow towards the liver. Other: None. IMPRESSION: Cholelithiasis without complicating factors. Electronically Signed   By: Alcide Clever M.D.   On: 08/08/2021 22:09    Pamella Pert, MD, PhD [facility]  Between 7 am - 7 pm I am available, please contact me via Amion (for emergencies) or Securechat (non urgent messages)  Between 7 pm - 7 am I am not available, please contact night coverage MD/APP via Amion

## 2021-08-09 NOTE — Progress Notes (Signed)
[name] [name] ([id number]) Visit Report for 08/08/2021 Arrival Information Details Patient Name: Date of Service: [name] [name], [name] [name] 08/08/2021 8:30 A M Medical Record Number: [id number] Patient Account Number: [id number] Date of Birth/Sex: Treating RN: [birth year] (65 y.o. Marcheta Grammes Primary Care Nycholas Rayner: Antony Contras Other Clinician: Referring Halei Hanover: Treating Sheyann Sulton/Extender: Olivia Mackie in Treatment: 12 Visit Information History Since Last Visit Added or deleted any medications: No Patient Arrived: [name] [name] Any new allergies or adverse reactions: No Arrival Time: 08:25 Had a fall or experienced change in No Accompanied By: wife activities of daily living that may affect Transfer Assistance: None risk of falls: Patient Identification Verified: Yes Signs or symptoms of abuse/neglect since last visito No Secondary Verification Process Completed: Yes Hospitalized since last visit: No Patient Requires Transmission-Based Precautions: No Implantable device outside of the clinic excluding No Patient Has Alerts: No cellular tissue based products placed in the center since last visit: Has Dressing in Place as Prescribed: Yes Pain Present Now: No Electronic Signature(s) Signed: 08/09/2021 6:09:57 PM By: Rhae Hammock RN Entered By: Rhae Hammock on 08/08/2021 08:26:59 -------------------------------------------------------------------------------- Clinic Level of Care Assessment Details Patient Name: Date of Service: [name] [name], [name] [name] 08/08/2021 8:30 A M Medical Record Number: [id number] Patient Account Number: [id number] Date of Birth/Sex: Treating RN: May 06, 1956 (65 y.o. [name] [name], [name] [name] Primary Care Arieon Corcoran: Antony Contras Other Clinician: Referring Maygan Koeller: Treating Armas Mcbee/Extender: Olivia Mackie in Treatment: 12 Clinic Level of Care Assessment Items TOOL 4 Quantity Score X- 1 0 Use when only an EandM is  performed on FOLLOW-UP visit ASSESSMENTS - Nursing Assessment / Reassessment X- 1 10 Reassessment of Co-morbidities (includes updates in patient status) X- 1 5 Reassessment of Adherence to Treatment Plan ASSESSMENTS - Wound and Skin A ssessment / Reassessment X - Simple Wound Assessment / Reassessment - one wound 1 5 []  - 0 Complex Wound Assessment / Reassessment - multiple wounds X- 1 10 Dermatologic / Skin Assessment (not related to wound area) ASSESSMENTS - Focused Assessment X- 1 5 Circumferential Edema Measurements - multi extremities []  - 0 Nutritional Assessment / Counseling / Intervention []  - 0 Lower Extremity Assessment (monofilament, tuning fork, pulses) []  - 0 Peripheral Arterial Disease Assessment (using hand held doppler) ASSESSMENTS - Ostomy and/or Continence Assessment and Care []  - 0 Incontinence Assessment and Management []  - 0 Ostomy Care Assessment and Management (repouching, etc.) PROCESS - Coordination of Care []  - 0 Simple Patient / Family Education for ongoing care X- 1 20 Complex (extensive) Patient / Family Education for ongoing care []  - 0 Staff obtains Programmer, systems, Records, T Results / Process Orders est []  - 0 Staff telephones HHA, Nursing Homes / Clarify orders / etc []  - 0 Routine Transfer to another Facility (non-emergent condition) []  - 0 Routine Hospital Admission (non-emergent condition) []  - 0 New Admissions / Biomedical engineer / Ordering NPWT Apligraf, etc. , X- 1 20 Emergency Hospital Admission (emergent condition) []  - 0 Simple Discharge Coordination X- 1 15 Complex (extensive) Discharge Coordination PROCESS - Special Needs []  - 0 Pediatric / Minor Patient Management []  - 0 Isolation Patient Management []  - 0 Hearing / Language / Visual special needs []  - 0 Assessment of Community assistance (transportation, D/C planning, etc.) []  - 0 Additional assistance / Altered mentation []  - 0 Support Surface(s) Assessment  (bed, cushion, seat, etc.) INTERVENTIONS - Wound Cleansing / Measurement []  - 0 Simple Wound Cleansing - one wound X- 3 5 Complex Wound Cleansing - multiple wounds X-  1 5 Wound Imaging (photographs - any number of wounds) $RemoveBe'[]'tRRlsFhGD$  - 0 Wound Tracing (instead of photographs) $RemoveBeforeD'[]'EqnRdFIMjtSjmZ$  - 0 Simple Wound Measurement - one wound X- 3 5 Complex Wound Measurement - multiple wounds INTERVENTIONS - Wound Dressings $RemoveBeforeD'[]'RxGcrbaxWglRNm$  - 0 Small Wound Dressing one or multiple wounds X- 3 15 Medium Wound Dressing one or multiple wounds $RemoveBeforeD'[]'wHUUlXCrmaifmF$  - 0 Large Wound Dressing one or multiple wounds $RemoveBeforeD'[]'aCugSAAfpLbClW$  - 0 Application of Medications - topical $RemoveB'[]'pLmoXaQy$  - 0 Application of Medications - injection INTERVENTIONS - Miscellaneous $RemoveBeforeD'[]'nrCTMmlndbbmHZ$  - 0 External ear exam $Remove'[]'CiXFQnu$  - 0 Specimen Collection (cultures, biopsies, blood, body fluids, etc.) $RemoveBefor'[]'JWEsHZMChdre$  - 0 Specimen(s) / Culture(s) sent or taken to Lab for analysis $RemoveBefo'[]'TdkphXhPdxb$  - 0 Patient Transfer (multiple staff / Civil Service fast streamer / Similar devices) $RemoveBeforeDE'[]'ukVtxZKxucDDVqi$  - 0 Simple Staple / Suture removal (25 or less) $Remove'[]'VubhbwR$  - 0 Complex Staple / Suture removal (26 or more) $Remove'[]'biFTMHX$  - 0 Hypo / Hyperglycemic Management (close monitor of Blood Glucose) $RemoveBefore'[]'ooSnyxZmrlSUY$  - 0 Ankle / Brachial Index (ABI) - do not check if billed separately X- 1 5 Vital Signs Has the patient been seen at the hospital within the last three years: Yes Total Score: 175 Level Of Care: New/Established - Level 5 Electronic Signature(s) Signed: 08/09/2021 6:09:57 PM By: Rhae Hammock RN Entered By: Rhae Hammock on 08/08/2021 09:51:25 -------------------------------------------------------------------------------- Encounter Discharge Information Details Patient Name: Date of Service: [name] [name]. 08/08/2021 8:30 A M Medical Record Number: [id number] Patient Account Number: [id number] Date of Birth/Sex: Treating RN: 1956-11-03 (65 y.o. [name] [name], [name] [name] Primary Care Herron Fero: Antony Contras Other Clinician: Referring Geri Hepler: Treating Arya Luttrull/Extender: Olivia Mackie in Treatment: 12 Encounter Discharge Information Items Discharge Condition: Stable Ambulatory Status: Wheelchair Discharge Destination: Emergency Room Telephoned: No Orders Sent: Yes Transportation: Other Accompanied By: wifey Schedule Follow-up Appointment: Yes Clinical Summary of Care: Patient Declined Electronic Signature(s) Signed: 08/09/2021 6:09:57 PM By: Rhae Hammock RN Entered By: Rhae Hammock on 08/08/2021 09:53:53 -------------------------------------------------------------------------------- Lower Extremity Assessment Details Patient Name: Date of Service: [name] [name] 08/08/2021 8:30 A M Medical Record Number: [id number] Patient Account Number: [id number] Date of Birth/Sex: Treating RN: September 16, 1956 (65 y.o. [name] [name], [name] [name] Primary Care Marcell Chavarin: Antony Contras Other Clinician: Referring Romonda Parker: Treating Sandor Arboleda/Extender: Olivia Mackie in Treatment: 12 Edema Assessment Assessed: Shirlyn Goltz: Yes] Patrice Paradise: Yes] Edema: [Left: No] [Right: No] Calf Left: Right: Point of Measurement: 34 cm From Medial Instep 41 cm 42.5 cm Ankle Left: Right: Point of Measurement: 10 cm From Medial Instep 23 cm 23 cm Vascular Assessment Pulses: Dorsalis Pedis Palpable: [Left:Yes] [Right:Yes] Posterior Tibial Palpable: [Left:Yes] [Right:Yes] Electronic Signature(s) Signed: 08/09/2021 6:09:57 PM By: Rhae Hammock RN Entered By: Rhae Hammock on 08/08/2021 08:27:49 -------------------------------------------------------------------------------- Multi Wound Chart Details Patient Name: Date of Service: [name] [name] 08/08/2021 8:30 A M Medical Record Number: [id number] Patient Account Number: [id number] Date of Birth/Sex: Treating RN: 1956-05-18 (65 y.o. Marcheta Grammes Primary Care Anniemae Haberkorn: Antony Contras Other Clinician: Referring Janssen Zee: Treating Anakaren Campion/Extender: Olivia Mackie in Treatment: 12 Vital Signs Height(in): 72 Capillary Blood Glucose(mg/dl): 120 Weight(lbs): 271 Pulse(bpm): 89 Body Mass Index(BMI): 39 Blood Pressure(mmHg): 95/62 Temperature(F): 97.9 Respiratory Rate(breaths/min): 17 Photos: [2:No Photos] Left T Great oe Left T Great oe Right T Great oe Wound Location: Gradually Appeared Gradually Appeared Gradually Appeared Wounding Event: Diabetic Wound/Ulcer of the Lower Diabetic Wound/Ulcer of the Lower Diabetic Wound/Ulcer of the Lower Primary Etiology: Extremity Extremity Extremity Sleep Apnea, Coronary Artery Sleep Apnea, Coronary Artery Sleep Apnea, Coronary  Artery Comorbid History: Disease, Hypertension, Type II Disease, Hypertension, Type II Disease, Hypertension, Type II Diabetes, Osteoarthritis, Neuropathy, Diabetes, Osteoarthritis, Neuropathy, Diabetes, Osteoarthritis, Neuropathy, Confinement Anxiety Confinement Anxiety Confinement Anxiety 03/20/2021 03/20/2021 07/21/2020 Date Acquired: _0 Weeks of Treatment: Open Open Open Wound Status: 0.8x1.2x0.1 0.8x1.2x0.1 1x2.1x1.4 Measurements L x W x D (cm) 0.754 0.754 1.649 A (cm) : rea 0.075 0.075 2.309 Volume (cm) : 50.80% 50.80% -55.60% % Reduction in A rea: 75.50% 75.50% -444.60% % Reduction in Volume: 3 Position 1 (o'clock): 1.4 Maximum Distance 1 (cm): No N/A Yes Tunneling: Grade 2 Grade 2 Grade 2 Classification: Medium Medium Medium Exudate A mount: Serosanguineous Serosanguineous Serosanguineous Exudate Type: red, brown red, brown red, brown Exudate Color: Thickened N/A Thickened Wound Margin: Large (67-100%) N/A Medium (34-66%) Granulation A mount: Red, Pink, Pale N/A Red Granulation Quality: Small (1-33%) N/A Medium (34-66%) Necrotic A mount: Fat Layer (Subcutaneous Tissue): Yes N/A Fat Layer (Subcutaneous Tissue): Yes Exposed Structures: Fascia: No Fascia: No Tendon: No Tendon: No Muscle: No Muscle: No Joint: No Joint:  No Bone: No Bone: No Small (1-33%) N/A None Epithelialization: Wound Number: 4 N/A N/A Photos: N/A N/A Right T Second oe N/A N/A Wound Location: Gradually Appeared N/A N/A Wounding Event: Diabetic Wound/Ulcer of the Lower N/A N/A Primary Etiology: Extremity Sleep Apnea, Coronary Artery N/A N/A Comorbid History: Disease, Hypertension, Type II Diabetes, Osteoarthritis, Neuropathy, Confinement Anxiety 05/02/2021 N/A N/A Date Acquired: 12 N/A N/A Weeks of Treatment: Open N/A N/A Wound Status: 0.5x0.5x0.3 N/A N/A Measurements L x W x D (cm) 0.196 N/A N/A A (cm) : rea 0.059 N/A N/A Volume (cm) : 0.00% N/A N/A % Reduction in A rea: -51.30% N/A N/A % Reduction in Volume: No N/A N/A Tunneling: Grade 2 N/A N/A Classification: Medium N/A N/A Exudate A mount: Serosanguineous N/A N/A Exudate Type: red, brown N/A N/A Exudate Color: Distinct, outline attached N/A N/A Wound Margin: Large (67-100%) N/A N/A Granulation A mount: Pink, Pale N/A N/A Granulation Quality: None Present (0%) N/A N/A Necrotic A mount: Fat Layer (Subcutaneous Tissue): Yes N/A N/A Exposed Structures: Fascia: No Tendon: No Muscle: No Joint: No Bone: No Small (1-33%) N/A N/A Epithelialization: Treatment Notes Wound #2 (Toe Great) Wound Laterality: Left Cleanser Soap and Water Discharge Instruction: May shower and wash wound with dial antibacterial soap and water prior to dressing change. Peri-Wound Care Topical Primary Dressing KerraCel Ag Gelling Fiber Dressing, 2x2 in (silver alginate) Discharge Instruction: Apply silver alginate to wound bed as instructed Secondary Dressing Woven Gauze Sponges 2x2 in Discharge Instruction: Apply over primary dressing as directed. Optifoam Non-Adhesive Dressing, 4x4 in Discharge Instruction: Apply as donut over dressing Secured With Conforming Stretch Gauze Bandage, Sterile 2x75 (in/in) Discharge Instruction: Secure with stretch gauze as  directed. Transpore Surgical Tape, 2x10 (in/yd) Discharge Instruction: Secure dressing with tape as directed. Compression Wrap Compression Stockings Add-Ons Wound #3 (Toe Great) Wound Laterality: Right Cleanser Soap and Water Discharge Instruction: May shower and wash wound with dial antibacterial soap and water prior to dressing change. Peri-Wound Care Topical Primary Dressing KerraCel Ag Gelling Fiber Dressing, 2x2 in (silver alginate) Discharge Instruction: Apply silver alginate to wound bed as instructed Secondary Dressing Woven Gauze Sponges 2x2 in Discharge Instruction: Apply over primary dressing as directed. Optifoam Non-Adhesive Dressing, 4x4 in Discharge Instruction: Apply as donut over dressing Secured With Conforming Stretch Gauze Bandage, Sterile 2x75 (in/in) Discharge Instruction: Secure with stretch gauze as directed. Transpore Surgical Tape, 2x10 (in/yd) Discharge Instruction: Secure dressing with tape as directed. Compression Wrap Compression  Stockings Add-Ons Wound #4 (Toe Second) Wound Laterality: Right Cleanser Soap and Water Discharge Instruction: May shower and wash wound with dial antibacterial soap and water prior to dressing change. Peri-Wound Care Topical Primary Dressing KerraCel Ag Gelling Fiber Dressing, 2x2 in (silver alginate) Discharge Instruction: Apply silver alginate to wound bed as instructed Secondary Dressing Woven Gauze Sponges 2x2 in Discharge Instruction: Apply over primary dressing as directed. Optifoam Non-Adhesive Dressing, 4x4 in Discharge Instruction: Apply as donut over dressing Secured With Conforming Stretch Gauze Bandage, Sterile 2x75 (in/in) Discharge Instruction: Secure with stretch gauze as directed. Transpore Surgical Tape, 2x10 (in/yd) Discharge Instruction: Secure dressing with tape as directed. Compression Wrap Compression Stockings Add-Ons Electronic Signature(s) Signed: 08/08/2021 12:41:49 PM By: Kalman Shan DO Signed: 08/08/2021 5:01:42 PM By: Lorrin Jackson Entered By: Kalman Shan on 08/08/2021 11:23:46 -------------------------------------------------------------------------------- Multi-Disciplinary Care Plan Details Patient Name: Date of Service: [name] [name] 08/08/2021 8:30 A M Medical Record Number: [id number] Patient Account Number: [id number] Date of Birth/Sex: Treating RN: 10/27/1956 (65 y.o. [name] [name], [name] [name] Primary Care Aurthur Wingerter: Antony Contras Other Clinician: Referring Wah Sabic: Treating Kaylean Tupou/Extender: Olivia Mackie in Treatment: 12 Active Inactive Nutrition Nursing Diagnoses: Impaired glucose control: actual or potential Goals: Patient/caregiver verbalizes understanding of need to maintain therapeutic glucose control per primary care physician Date Initiated: 05/16/2021 Target Resolution Date: 08/15/2021 Goal Status: Active Interventions: Provide education on elevated blood sugars and impact on wound healing Provide education on nutrition Notes: 06/13/21: Glucose control ongoing 07/18/21: Glucose control remains ongoing. Wound/Skin Impairment Nursing Diagnoses: Impaired tissue integrity Goals: Patient/caregiver will verbalize understanding of skin care regimen Date Initiated: 05/16/2021 Date Inactivated: 06/13/2021 Target Resolution Date: 06/13/2021 Goal Status: Met Ulcer/skin breakdown will have a volume reduction of 30% by week 4 Date Initiated: 05/16/2021 Date Inactivated: 06/13/2021 Target Resolution Date: 06/13/2021 Goal Status: Met Ulcer/skin breakdown will have a volume reduction of 50% by week 8 Date Initiated: 06/13/2021 Target Resolution Date: 08/08/2021 Goal Status: Active Interventions: Assess patient/caregiver ability to obtain necessary supplies Assess patient/caregiver ability to perform ulcer/skin care regimen upon admission and as needed Assess ulceration(s) every visit Provide education on ulcer and  skin care Treatment Activities: Skin care regimen initiated : 05/16/2021 Topical wound management initiated : 05/16/2021 Notes: 06/13/21: Wounds greater than 30% volume reduction, new goal initiated. 07/18/21: wounds not yet greater than 50% volume reduction, patient not be coming to appointments. Electronic Signature(s) Signed: 08/09/2021 6:09:57 PM By: Rhae Hammock RN Entered By: Rhae Hammock on 08/08/2021 09:12:10 -------------------------------------------------------------------------------- Pain Assessment Details Patient Name: Date of Service: [name], [name] 08/08/2021 8:30 A M Medical Record Number: [id number] Patient Account Number: [id number] Date of Birth/Sex: Treating RN: Aug 31, 1956 (65 y.o. [name] [name], [name] [name] Primary Care Merlean Pizzini: Antony Contras Other Clinician: Referring Kidada Ging: Treating Deverick Pruss/Extender: Olivia Mackie in Treatment: 12 Active Problems Location of Pain Severity and Description of Pain Patient Has Paino Yes Site Locations Pain Location: Generalized Pain, Pain in Ulcers With Dressing Change: Yes Duration of the Pain. Constant / Intermittento Constant Rate the pain. Current Pain Level: 8 Worst Pain Level: 10 Least Pain Level: 0 Tolerable Pain Level: 8 Character of Pain Describe the Pain: Aching Pain Management and Medication Current Pain Management: Medication: No Cold Application: No Rest: No Massage: No Activity: No T.E.N.S.: No Heat Application: No Leg drop or elevation: No Is the Current Pain Management Adequate: Adequate How does your wound impact your activities of daily livingo Sleep: No Bathing: No Appetite: No Relationship With Others: No Bladder Continence: No Emotions:  No Bowel Continence: No Work: No Toileting: No Drive: No Dressing: No Hobbies: No Electronic Signature(s) Signed: 08/09/2021 6:09:57 PM By: Rhae Hammock RN Entered By: Rhae Hammock on 08/08/2021  08:27:39 -------------------------------------------------------------------------------- Patient/Caregiver Education Details Patient Name: Date of Service: [name] [name] 9/19/2022andnbsp8:30 A M Medical Record Number: [id number] Patient Account Number: [id number] Date of Birth/Gender: Treating RN: 15-Jun-1956 (65 y.o. Erie Noe Primary Care Physician: Antony Contras Other Clinician: Referring Physician: Treating Physician/Extender: Olivia Mackie in Treatment: 12 Education Assessment Education Provided To: Patient Education Topics Provided Wound/Skin Impairment: Methods: Explain/Verbal Responses: Reinforcements needed Electronic Signature(s) Signed: 08/09/2021 6:09:57 PM By: Rhae Hammock RN Entered By: Rhae Hammock on 08/08/2021 08:42:02 -------------------------------------------------------------------------------- Wound Assessment Details Patient Name: Date of Service: [name] [name] 08/08/2021 8:30 A M Medical Record Number: [id number] Patient Account Number: [id number] Date of Birth/Sex: Treating RN: 1956-05-30 (65 y.o. [name] [name], [name] [name] Primary Care Leonie Amacher: Antony Contras Other Clinician: Referring Hawke Villalpando: Treating Jaysin Gayler/Extender: Olivia Mackie in Treatment: 12 Wound Status Wound Number: 2 Primary Diabetic Wound/Ulcer of the Lower Extremity Etiology: Wound Location: Left T Great oe Wound Open Wounding Event: Gradually Appeared Status: Date Acquired: 03/20/2021 Comorbid Sleep Apnea, Coronary Artery Disease, Hypertension, Type II Weeks Of Treatment: 12 History: Diabetes, Osteoarthritis, Neuropathy, Confinement Anxiety Clustered Wound: No Wound Measurements Length: (cm) 0.8 Width: (cm) 1.2 Depth: (cm) 0.1 Area: (cm) 0.754 Volume: (cm) 0.075 % Reduction in Area: 50.8% % Reduction in Volume: 75.5% Epithelialization: Small (1-33%) Tunneling: No Undermining: No Wound  Description Classification: Grade 2 Wound Margin: Thickened Exudate Amount: Medium Exudate Type: Serosanguineous Exudate Color: red, brown Foul Odor After Cleansing: No Slough/Fibrino No Wound Bed Granulation Amount: Large (67-100%) Exposed Structure Granulation Quality: Red, Pink, Pale Fascia Exposed: No Necrotic Amount: Small (1-33%) Fat Layer (Subcutaneous Tissue) Exposed: Yes Necrotic Quality: Adherent Slough Tendon Exposed: No Muscle Exposed: No Joint Exposed: No Bone Exposed: No Electronic Signature(s) Signed: 08/09/2021 6:09:57 PM By: Rhae Hammock RN Entered By: Rhae Hammock on 08/08/2021 08:33:10 -------------------------------------------------------------------------------- Wound Assessment Details Patient Name: Date of Service: [name] [name] 08/08/2021 8:30 A M Medical Record Number: [id number] Patient Account Number: [id number] Date of Birth/Sex: Treating RN: [birth year] (65 y.o. [name] [name], [name] [name] Primary Care Madigan Rosensteel: Antony Contras Other Clinician: Referring Asa Baudoin: Treating Jameka Ivie/Extender: Olivia Mackie in Treatment: 12 Wound Status Wound Number: 2 Primary Diabetic Wound/Ulcer of the Lower Extremity Etiology: Wound Location: Left T Great oe Wound Open Wounding Event: Gradually Appeared Status: Date Acquired: 03/20/2021 Comorbid Sleep Apnea, Coronary Artery Disease, Hypertension, Type II Weeks Of Treatment: 12 History: Diabetes, Osteoarthritis, Neuropathy, Confinement Anxiety Clustered Wound: No Photos Wound Measurements Length: (cm) 0.8 Width: (cm) 1.2 Depth: (cm) 0.1 Area: (cm) 0.754 Volume: (cm) 0.075 % Reduction in Area: 50.8% % Reduction in Volume: 75.5% Wound Description Classification: Grade 2 Exudate Amount: Medium Exudate Type: Serosanguineous Exudate Color: red, brown Treatment Notes Wound #2 (Toe Great) Wound Laterality: Left Cleanser Soap and Water Discharge Instruction: May shower  and wash wound with dial antibacterial soap and water prior to dressing change. Peri-Wound Care Topical Primary Dressing KerraCel Ag Gelling Fiber Dressing, 2x2 in (silver alginate) Discharge Instruction: Apply silver alginate to wound bed as instructed Secondary Dressing Woven Gauze Sponges 2x2 in Discharge Instruction: Apply over primary dressing as directed. Optifoam Non-Adhesive Dressing, 4x4 in Discharge Instruction: Apply as donut over dressing Secured With Conforming Stretch Gauze Bandage, Sterile 2x75 (in/in) Discharge Instruction: Secure with stretch gauze as directed. Transpore Surgical Tape, 2x10 (in/yd) Discharge Instruction: Secure  dressing with tape as directed. Compression Wrap Compression Stockings Add-Ons Electronic Signature(s) Signed: 08/09/2021 6:09:57 PM By: Rhae Hammock RN Entered By: Rhae Hammock on 08/08/2021 08:33:47 -------------------------------------------------------------------------------- Wound Assessment Details Patient Name: Date of Service: [name] [name] 08/08/2021 8:30 A M Medical Record Number: [id number] Patient Account Number: [id number] Date of Birth/Sex: Treating RN: 15-Jul-1956 (65 y.o. [name] [name], [name] [name] Primary Care Shaneen Reeser: Antony Contras Other Clinician: Referring Cristabel Bicknell: Treating Miray Mancino/Extender: Olivia Mackie in Treatment: 12 Wound Status Wound Number: 3 Primary Diabetic Wound/Ulcer of the Lower Extremity Etiology: Wound Location: Right T Great oe Wound Open Wounding Event: Gradually Appeared Status: Date Acquired: 07/21/2020 Comorbid Sleep Apnea, Coronary Artery Disease, Hypertension, Type II Weeks Of Treatment: 12 History: Diabetes, Osteoarthritis, Neuropathy, Confinement Anxiety Clustered Wound: No Photos Wound Measurements Length: (cm) 1 Width: (cm) 2.1 Depth: (cm) 1.4 Area: (cm) 1.649 Volume: (cm) 2.309 % Reduction in Area: -55.6% % Reduction in Volume:  -444.6% Epithelialization: None Tunneling: Yes Position (o'clock): 3 Maximum Distance: (cm) 1.4 Undermining: No Wound Description Classification: Grade 2 Wound Margin: Thickened Exudate Amount: Medium Exudate Type: Serosanguineous Exudate Color: red, brown Foul Odor After Cleansing: No Slough/Fibrino No Wound Bed Granulation Amount: Medium (34-66%) Exposed Structure Granulation Quality: Red Fascia Exposed: No Necrotic Amount: Medium (34-66%) Fat Layer (Subcutaneous Tissue) Exposed: Yes Necrotic Quality: Adherent Slough Tendon Exposed: No Muscle Exposed: No Joint Exposed: No Bone Exposed: No Treatment Notes Wound #3 (Toe Great) Wound Laterality: Right Cleanser Soap and Water Discharge Instruction: May shower and wash wound with dial antibacterial soap and water prior to dressing change. Peri-Wound Care Topical Primary Dressing KerraCel Ag Gelling Fiber Dressing, 2x2 in (silver alginate) Discharge Instruction: Apply silver alginate to wound bed as instructed Secondary Dressing Woven Gauze Sponges 2x2 in Discharge Instruction: Apply over primary dressing as directed. Optifoam Non-Adhesive Dressing, 4x4 in Discharge Instruction: Apply as donut over dressing Secured With Conforming Stretch Gauze Bandage, Sterile 2x75 (in/in) Discharge Instruction: Secure with stretch gauze as directed. Transpore Surgical Tape, 2x10 (in/yd) Discharge Instruction: Secure dressing with tape as directed. Compression Wrap Compression Stockings Add-Ons Electronic Signature(s) Signed: 08/09/2021 6:09:57 PM By: Rhae Hammock RN Entered By: Rhae Hammock on 08/08/2021 08:34:21 -------------------------------------------------------------------------------- Wound Assessment Details Patient Name: Date of Service: [name] [name] 08/08/2021 8:30 A M Medical Record Number: [id number] Patient Account Number: [id number] Date of Birth/Sex: Treating RN: [birth year] (65 y.o. [name] [name],  [name] [name] Primary Care Arelly Whittenberg: Antony Contras Other Clinician: Referring Clarissa Laird: Treating Olayinka Gathers/Extender: Olivia Mackie in Treatment: 12 Wound Status Wound Number: 4 Primary Diabetic Wound/Ulcer of the Lower Extremity Etiology: Wound Location: Right T Second oe Wound Open Wounding Event: Gradually Appeared Status: Date Acquired: 05/02/2021 Comorbid Sleep Apnea, Coronary Artery Disease, Hypertension, Type II Weeks Of Treatment: 12 History: Diabetes, Osteoarthritis, Neuropathy, Confinement Anxiety Clustered Wound: No Photos Wound Measurements Length: (cm) 0.5 Width: (cm) 0.5 Depth: (cm) 0.3 Area: (cm) 0.196 Volume: (cm) 0.059 % Reduction in Area: 0% % Reduction in Volume: -51.3% Epithelialization: Small (1-33%) Tunneling: No Undermining: No Wound Description Classification: Grade 2 Wound Margin: Distinct, outline attached Exudate Amount: Medium Exudate Type: Serosanguineous Exudate Color: red, brown Foul Odor After Cleansing: No Slough/Fibrino No Wound Bed Granulation Amount: Large (67-100%) Exposed Structure Granulation Quality: Pink, Pale Fascia Exposed: No Necrotic Amount: None Present (0%) Fat Layer (Subcutaneous Tissue) Exposed: Yes Tendon Exposed: No Muscle Exposed: No Joint Exposed: No Bone Exposed: No Treatment Notes Wound #4 (Toe Second) Wound Laterality: Right Cleanser Soap and Water Discharge Instruction: May shower and wash wound  with dial antibacterial soap and water prior to dressing change. Peri-Wound Care Topical Primary Dressing KerraCel Ag Gelling Fiber Dressing, 2x2 in (silver alginate) Discharge Instruction: Apply silver alginate to wound bed as instructed Secondary Dressing Woven Gauze Sponges 2x2 in Discharge Instruction: Apply over primary dressing as directed. Optifoam Non-Adhesive Dressing, 4x4 in Discharge Instruction: Apply as donut over dressing Secured With Conforming Stretch Gauze Bandage, Sterile  2x75 (in/in) Discharge Instruction: Secure with stretch gauze as directed. Transpore Surgical Tape, 2x10 (in/yd) Discharge Instruction: Secure dressing with tape as directed. Compression Wrap Compression Stockings Add-Ons Electronic Signature(s) Signed: 08/09/2021 6:09:57 PM By: Rhae Hammock RN Entered By: Rhae Hammock on 08/08/2021 08:34:54 -------------------------------------------------------------------------------- Vitals Details Patient Name: Date of Service: [name] [name]. 08/08/2021 8:30 A M Medical Record Number: [id number] Patient Account Number: [id number] Date of Birth/Sex: Treating RN: 31-Mar-1956 (65 y.o. [name] [name], [name] [name] Primary Care Nandan Willems: Antony Contras Other Clinician: Referring Rendy Lazard: Treating Samanthajo Payano/Extender: Olivia Mackie in Treatment: 12 Vital Signs Time Taken: 08:27 Temperature (F): 97.9 Height (in): 72 Pulse (bpm): 73 Weight (lbs): 271 Respiratory Rate (breaths/min): 17 Body Mass Index (BMI): 36.8 Blood Pressure (mmHg): 95/62 Capillary Blood Glucose (mg/dl): 120 Reference Range: 80 - 120 mg / dl Electronic Signature(s) Signed: 08/09/2021 6:09:57 PM By: Rhae Hammock RN Entered By: Rhae Hammock on 08/08/2021 08:27:15

## 2021-08-09 NOTE — Plan of Care (Signed)

## 2021-08-10 ENCOUNTER — Inpatient Hospital Stay (HOSPITAL_COMMUNITY): Payer: 59

## 2021-08-10 ENCOUNTER — Encounter (HOSPITAL_COMMUNITY): Payer: Self-pay | Admitting: Podiatry

## 2021-08-10 DIAGNOSIS — R7881 Bacteremia: Secondary | ICD-10-CM

## 2021-08-10 DIAGNOSIS — R7989 Other specified abnormal findings of blood chemistry: Secondary | ICD-10-CM | POA: Diagnosis not present

## 2021-08-10 DIAGNOSIS — L03115 Cellulitis of right lower limb: Secondary | ICD-10-CM | POA: Diagnosis not present

## 2021-08-10 DIAGNOSIS — B955 Unspecified streptococcus as the cause of diseases classified elsewhere: Secondary | ICD-10-CM

## 2021-08-10 DIAGNOSIS — Z9889 Other specified postprocedural states: Secondary | ICD-10-CM | POA: Diagnosis not present

## 2021-08-10 DIAGNOSIS — L03116 Cellulitis of left lower limb: Secondary | ICD-10-CM

## 2021-08-10 DIAGNOSIS — M869 Osteomyelitis, unspecified: Secondary | ICD-10-CM | POA: Diagnosis not present

## 2021-08-10 DIAGNOSIS — E0843 Diabetes mellitus due to underlying condition with diabetic autonomic (poly)neuropathy: Secondary | ICD-10-CM | POA: Diagnosis not present

## 2021-08-10 LAB — GLUCOSE, CAPILLARY
Glucose-Capillary: 208 mg/dL — ABNORMAL HIGH (ref 70–99)
Glucose-Capillary: 172 mg/dL — ABNORMAL HIGH (ref 70–99)
Glucose-Capillary: 306 mg/dL — ABNORMAL HIGH (ref 70–99)
Glucose-Capillary: 281 mg/dL — ABNORMAL HIGH (ref 70–99)
Glucose-Capillary: 190 mg/dL — ABNORMAL HIGH (ref 70–99)

## 2021-08-10 LAB — BASIC METABOLIC PANEL
Anion gap: 9 (ref 5–15)
BUN: 30 mg/dL — ABNORMAL HIGH (ref 8–23)
CO2: 28 mmol/L (ref 22–32)
Calcium: 8.3 mg/dL — ABNORMAL LOW (ref 8.9–10.3)
Chloride: 97 mmol/L — ABNORMAL LOW (ref 98–111)
Creatinine, Ser: 1.25 mg/dL — ABNORMAL HIGH (ref 0.61–1.24)
GFR, Estimated: >60 mL/min (ref >60)
Glucose, Bld: 191 mg/dL — ABNORMAL HIGH (ref 70–99)
Potassium: 3.7 mmol/L (ref 3.5–5.1)
Sodium: 134 mmol/L — ABNORMAL LOW (ref 135–145)

## 2021-08-10 LAB — CBC
HCT: 35.6 % — ABNORMAL LOW (ref 39.0–52.0)
Hemoglobin: 12.1 g/dL — ABNORMAL LOW (ref 13.0–17.0)
MCH: 29.4 pg (ref 26.0–34.0)
MCHC: 34 g/dL (ref 30.0–36.0)
MCV: 86.6 fL (ref 80.0–100.0)
Platelets: 271 10*3/uL (ref 150–400)
RBC: 4.11 MIL/uL — ABNORMAL LOW (ref 4.22–5.81)
RDW: 14.4 % (ref 11.5–15.5)
WBC: 24.8 10*3/uL — ABNORMAL HIGH (ref 4.0–10.5)
nRBC: 0 % (ref 0.0–0.2)

## 2021-08-10 MED ORDER — POLYETHYLENE GLYCOL 3350 17 G PO PACK: 17.0000 g | PACK | Freq: Every day | ORAL | Status: DC | PRN

## 2021-08-10 MED ORDER — SODIUM CHLORIDE 0.9 % IV SOLN
2.0000 g | INTRAVENOUS | Status: DC
  Administered 2021-08-10 – 2021-08-12 (×3): 2 g via INTRAVENOUS
  Filled 2021-08-10 (×3): qty 20

## 2021-08-10 MED ORDER — INSULIN ASPART 100 UNIT/ML IJ SOLN
3.0000 [IU] | Freq: Three times a day (TID) | INTRAMUSCULAR | Status: DC
  Administered 2021-08-10 – 2021-08-12 (×7): 3 [IU] via SUBCUTANEOUS

## 2021-08-10 MED ORDER — SENNOSIDES-DOCUSATE SODIUM 8.6-50 MG PO TABS
2.0000 | ORAL_TABLET | Freq: Two times a day (BID) | ORAL | Status: DC
  Administered 2021-08-10 – 2021-08-20 (×21): 2 via ORAL
  Filled 2021-08-10 (×21): qty 2

## 2021-08-10 MED ORDER — FUROSEMIDE 10 MG/ML IJ SOLN
20.0000 mg | Freq: Every day | INTRAMUSCULAR | Status: DC
  Administered 2021-08-10 – 2021-08-11 (×2): 20 mg via INTRAVENOUS
  Filled 2021-08-10 (×2): qty 2

## 2021-08-10 NOTE — Progress Notes (Signed)
Subjective: POD # 1 s/p right partial first amputation, partial second toe amputation.  He states he is feeling well this afternoon.  He was currently getting echo when I first came in.  He has no fevers or chills that he reports.  His pain is controlled.  His wife states that the cellulitis in the left foot is looking better as well since yesterday with starting antibiotics.  Objective: AAO x3, NAD DP/PT pulses palpable bilaterally, CRT less than 3 seconds Change the dressing today.  Status post partial partial potation as well as partial second digit amputation.  Sutures are intact but any dehiscence.  Some amount of bloody drainage expressed but there is no purulence.  There is edema present in the foot with slight warmth.  There is no areas of fluctuation crepitation.  There is no malodor.  There is no tenderness palpation on exam. On the left foot ulceration present plantar left hallux with a granular wound base with any probing, undermining or tunneling.  Faint surrounding erythema without any ascending status to the toe.  There is erythema extending from the ankle to the knee although does appear to be somewhat improved today.  There is no open sores on the left leg today. No pain with calf compression, swelling, warmth, erythema  Assessment: POD #1 s/p right partial first ray, partial second amputation  Plan: He was started on Ancef postoperatively however he is bacteremic as well and he has been switched to ceftriaxone.  ID is following as well.  White blood cell count did increase today however hopefully I would expect this to improve tomorrow after he started antibiotics as well as debridement of the infection of the right foot.  I had a discussion with the patient and daughters in the room today in regards to the treatment plan.  Already continue to monitor him clinically as well as his lab work over the next day or so.  Discussed possible repeat debridement if blood work not improving  over the next couple of days.  For now I want him on bedrest given the right foot as well as left knee pain.  Once infection appears to be better controlled we will start weightbearing in the Darco wedge shoe on the right side.  Encouraged elevation.  Podiatry will continue to follow.

## 2021-08-10 NOTE — Progress Notes (Signed)
*  PRELIMINARY RESULTS* Echocardiogram 2D Echocardiogram has been performed.  Stacey Drain 08/10/2021, 4:37 PM

## 2021-08-10 NOTE — Progress Notes (Signed)
Patient continues to decline nocturnal CPAP. He agrees to have family bring his home machine if his stay is expected to be more than another night.

## 2021-08-10 NOTE — Op Note (Signed)
PATIENT:  [name] [name]  65 y.o. male   PRE-OPERATIVE DIAGNOSIS:  Osteomyelitis   POST-OPERATIVE DIAGNOSIS:  Osteomyelitis   PROCEDURE:  Partial 1st ray amputation right foot; partial 2nd toe amputation, I&D deep abscess right foot.    SURGEON:  Surgeon(s) and Role:    * Zanaya Baize, Bonna Gains, DPM - Primary   PHYSICIAN ASSISTANT:    ASSISTANTS: none    ANESTHESIA:   MAC   EBL:  100 mL    BLOOD ADMINISTERED:none   DRAINS: none    LOCAL MEDICATIONS USED:  OTHER 20 cc lidocaine and marcaine plain   SPECIMEN:  Source of Specimen:  wound culture    DISPOSITION OF SPECIMEN:  PATHOLOGY   COUNTS:  YES   TOURNIQUET:  * Missing tourniquet times found for documented tourniquets in log: [id number] * Total Tourniquet Time Documented: Calf (Right) - 18 minutes Total: Calf (Right) - 18 minutes     DICTATION: .Viviann Spare Dictation   PLAN OF CARE: Admit to inpatient    PATIENT DISPOSITION:  PACU - hemodynamically stable.   Delay start of Pharmacological VTE agent (>24hrs) due to surgical blood loss or risk of bleeding: no  Indications for surgery: 65 year old male presented to the hospital from the wound care center for worsening wounds of his right foot as well as the for left leg cellulitis.  He had a chronic ulceration of the left foot.  MRI was concerning and showed osteomyelitis as well as abscess.  After discussion he agrees to proceed with amputation of the first toe as well as partial potation of the second toe.  We discussed the surgery as well as postoperative course.  Alternatives, risks, complications were discussed.  No promises or guarantees were given as the outcome of the procedure and all questions were answered.  Procedure in detail: The patient was both verbally and visually identified by myself, nursing staff, the anesthesia staff preoperatively.  He was then transferred to the operating room via stretcher where the surgery took place.  After an adequate plane of anesthesia  was obtained timeout was performed in 20 cc of lidocaine, Marcaine plain was infiltrated in a regional block fashion.  Tourniquet was applied and sure to pad all bony prominences.  The right lower extremities and scrubbed, prepped, draped in normal sterile fashion.  Timeout was performed.  A racquet shaped incision was planned along the first metatarsal curving along the MPJ of the first.  Incision was made from skin to bone circumferentially around the first MPJ.  There is fairly significant purulence identified from the toe and this was cultured.  The toe was then disarticulated at the level of the MPJ.  There was found to be purulence extending along the lateral aspect the first metatarsal.  There was found to be some darkened discoloration present metatarsal head.  Due to this I elected to proceed with resection of the metatarsal head.  A sagittal saw was utilized to resect the distal portion of the first metatarsal.  Remaining bone appeared to be viable and was healthy in nature, white in color.  At this time there is found to be purulence coming from the first interspace tracking to the plantar aspect of foot as well as the dorsal aspect of foot tracking laterally.  I was able to decompress all the purulence that I could evaluate.  There was also purulence coming along the second MPJ which I debrided.  After decompressed the abscesses I utilized a rongeur to remove any nonviable devitalized tissue.  After debridement there was no further purulence identified.  Attention was directed the second digit.  A fishmouth incision was planned along the second distal phalanx.  Incision was made with a 15 with scalpel from skin to bone around the level of DIPJ.  This kept a plantar flap intact as well.  I then utilized a 15 blade to disarticulate the DIPJ.  Toe was then passed off the table.  Remaining bone appeared to be viable.  To help control bleeding tourniquet was inflated at this time to 250 mmHg.  At this  time no further purulence was identified.  I utilized pulse lavage to irrigate the wounds.  After irrigation I further inspected the wounds and I was not able to identify any further purulence.   During the procedure hemostasis achieved electrocautery, Gelfoam as well as ligation.  Incision was then closed with horizontal mattress as well as simple interrupted sutures for the first partial ray amputation.  The second digit was closed in simple interrupted fashion with nylon.  Bulky sterile dressing was then applied.  Tourniquet was released and there is found to be an immediate capillary fill time to all the remaining digits.  He was awoken from anesthesia and found to tolerate the procedure without any complications.  Ancef was given after the cultures.  Postoperative course: He is remained inpatient.  He was started on Ancef.  Bedrest for now.  Await clinical response as well as monitoring lab work.  Discussed possible return to the operating room if needed.

## 2021-08-10 NOTE — Progress Notes (Signed)
Subjective:  [name] [name] feels better today   Antibiotics:  Anti-infectives (From admission, onward)    Start     Dose/Rate Route Frequency Ordered Stop   08/10/21 1000  cefTRIAXone (ROCEPHIN) 2 g in sodium chloride 0.9 % 100 mL IVPB        2 g 200 mL/hr over 30 Minutes Intravenous Every 24 hours 08/10/21 0905     08/10/21 0200  ceFAZolin (ANCEF) IVPB 1 g/50 mL premix  Status:  Discontinued        1 g 100 mL/hr over 30 Minutes Intravenous Every 8 hours 08/09/21 1912 08/09/21 1913   08/09/21 2000  ceFAZolin (ANCEF) IVPB 2g/100 mL premix  Status:  Discontinued        2 g 200 mL/hr over 30 Minutes Intravenous Every 8 hours 08/09/21 1811 08/10/21 0905   08/09/21 1758  ceFAZolin (ANCEF) 2-4 GM/100ML-% IVPB       Note to Pharmacy: Sharyn Creamer   : cabinet override      08/09/21 1758 08/10/21 0559       Medications: Scheduled Meds:  amLODipine  10 mg Oral QHS   aspirin  325 mg Oral Daily   buPROPion  300 mg Oral q morning   calcium carbonate  400 mg of elemental calcium Oral BID   enoxaparin (LOVENOX) injection  60 mg Subcutaneous Q24H   furosemide  20 mg Intravenous Daily   insulin aspart  0-15 Units Subcutaneous TID WC   insulin aspart  0-5 Units Subcutaneous QHS   insulin aspart  3 Units Subcutaneous TID WC   irbesartan  300 mg Oral Daily   metoprolol tartrate  100 mg Oral BID   omega-3 acid ethyl esters  1 g Oral Daily   pantoprazole  40 mg Oral Daily   potassium chloride SA  40 mEq Oral q morning   senna-docusate  2 tablet Oral BID   Vitamin D (Ergocalciferol)  50,000 Units Oral Q7 days   Continuous Infusions:  cefTRIAXone (ROCEPHIN)  IV 2 g (08/10/21 1033)   PRN Meds:.ALPRAZolam, oxyCODONE, polyethylene glycol, traMADol    Objective: Weight change: 5.654 kg  Intake/Output Summary (Last 24 hours) at 08/10/2021 1241 Last data filed at 08/10/2021 1020 Gross per 24 hour  Intake 1710 ml  Output 2400 ml  Net -690 ml    Blood pressure (!) 153/76, pulse 75,  temperature 98.7 F (37.1 C), temperature source Oral, resp. rate 16, height 6' (1.829 m), weight 118.6 kg, SpO2 99 %. Temp:  [97.8 F (36.6 C)-99.2 F (37.3 C)] 98.7 F (37.1 C) (09/21 0757) Pulse Rate:  [66-80] 75 (09/21 0757) Resp:  [15-20] 16 (09/21 0757) BP: (107-179)/(60-91) 153/76 (09/21 0757) SpO2:  [94 %-100 %] 99 % (09/21 0757) Weight:  [118.6 kg] 118.6 kg (09/20 1625)  Physical Exam: Physical Exam Constitutional:      Appearance: [name] [name] is well-developed.  HENT:     Head: Normocephalic and atraumatic.  Eyes:     Conjunctiva/sclera: Conjunctivae normal.  Cardiovascular:     Rate and Rhythm: Normal rate and regular rhythm.     Heart sounds: No murmur heard.   Friction rub present. No gallop.  Pulmonary:     Effort: Pulmonary effort is normal. No respiratory distress.     Breath sounds: No stridor. No wheezing or rhonchi.  Abdominal:     General: There is no distension.     Palpations: Abdomen is soft.  Musculoskeletal:        General:  Normal range of motion.     Cervical back: Normal range of motion and neck supple.  Skin:    General: Skin is warm and dry.     Findings: Erythema present. No rash.  Neurological:     General: No focal deficit present.     Mental Status: [name] [name] is alert and oriented to person, place, and time.  Psychiatric:        Mood and Affect: Mood normal.        Behavior: Behavior normal.        Thought Content: Thought content normal.        Judgment: Judgment normal.    Left lower extremity 08/08/2021       Left lower extremity 08/09/2021    Left lower extremity today August 10, 2021:      Toes bandaged    CBC:    BMET Recent Labs    08/09/21 0546 08/10/21 0503  NA 135 134*  K 3.5 3.7  CL 99 97*  CO2 25 28  GLUCOSE 195* 191*  BUN 27* 30*  CREATININE 1.21 1.25*  CALCIUM 7.5* 8.3*      Liver Panel  Recent Labs    08/08/21 1307 08/09/21 0546  PROT 7.3 6.4*  ALBUMIN 3.2* 2.7*  AST 63* 29  ALT 88* 64*   ALKPHOS 82 81  BILITOT 1.1 1.2        Sedimentation Rate Recent Labs    08/09/21 0546  ESRSEDRATE 44*    C-Reactive Protein Recent Labs    08/08/21 1307 08/09/21 0546  CRP 13.9* 16.9*     Micro Results: Recent Results (from the past 720 hour(s))  Blood Cultures x 2 sites     Status: Abnormal (Preliminary result)   Collection Time: 08/08/21  1:07 PM   Specimen: BLOOD  Result Value Ref Range Status   Specimen Description   Final    BLOOD RIGHT ANTECUBITAL Performed at [facility], [address], [place], [place] [address]    Special Requests   Final    BOTTLES DRAWN AEROBIC AND ANAEROBIC Blood Culture results may not be optimal due to an excessive volume of blood received in culture bottles Performed at [facility], [address] [place], [place] [address]    Culture  Setup Time   Final    GRAM POSITIVE COCCI ANAEROBIC BOTTLE ONLY Organism ID to follow CRITICAL RESULT CALLED TO, READ BACK BY AND VERIFIED WITH: Janine Limbo PHARMD [id number] 08/09/21 A BROWNING Performed at [facility], [address], [place], [place] [address]    Culture STREPTOCOCCUS INTERMEDIUS (A)  Final   Report Status PENDING  Incomplete  Blood Cultures x 2 sites     Status: None (Preliminary result)   Collection Time: 08/08/21  1:07 PM   Specimen: BLOOD  Result Value Ref Range Status   Specimen Description   Final    BLOOD BLOOD RIGHT FOREARM Performed at [facility], [address], [place], [place] [address]    Special Requests   Final    BOTTLES DRAWN AEROBIC AND ANAEROBIC Blood Culture results may not be optimal due to an excessive volume of blood received in culture bottles Performed at [facility], [address] [place], [place] [address]    Culture  Setup Time   Final    GRAM POSITIVE COCCI ANAEROBIC BOTTLE ONLY CRITICAL VALUE NOTED.  VALUE IS CONSISTENT WITH PREVIOUSLY REPORTED AND CALLED  VALUE. Performed at  [facility], [address] [place], [place] [address]    Culture GRAM POSITIVE COCCI  Final   Report Status PENDING  Incomplete  Blood Culture ID Panel (Reflexed)     Status: Abnormal   Collection Time: 08/08/21  1:07 PM  Result Value Ref Range Status   Enterococcus faecalis NOT DETECTED NOT DETECTED Final   Enterococcus Faecium NOT DETECTED NOT DETECTED Final   Listeria monocytogenes NOT DETECTED NOT DETECTED Final   Staphylococcus species NOT DETECTED NOT DETECTED Final   Staphylococcus aureus (BCID) NOT DETECTED NOT DETECTED Final   Staphylococcus epidermidis NOT DETECTED NOT DETECTED Final   Staphylococcus lugdunensis NOT DETECTED NOT DETECTED Final   Streptococcus species DETECTED (A) NOT DETECTED Final    Comment: Not Enterococcus species, Streptococcus agalactiae, Streptococcus pyogenes, or Streptococcus pneumoniae. CRITICAL RESULT CALLED TO, READ BACK BY AND VERIFIED WITH: Janine Limbo PHARMD [id number] 08/09/21 A BROWNING    Streptococcus agalactiae NOT DETECTED NOT DETECTED Final   Streptococcus pneumoniae NOT DETECTED NOT DETECTED Final   Streptococcus pyogenes NOT DETECTED NOT DETECTED Final   A.calcoaceticus-baumannii NOT DETECTED NOT DETECTED Final   Bacteroides fragilis NOT DETECTED NOT DETECTED Final   Enterobacterales NOT DETECTED NOT DETECTED Final   Enterobacter cloacae complex NOT DETECTED NOT DETECTED Final   Escherichia coli NOT DETECTED NOT DETECTED Final   Klebsiella aerogenes NOT DETECTED NOT DETECTED Final   Klebsiella oxytoca NOT DETECTED NOT DETECTED Final   Klebsiella pneumoniae NOT DETECTED NOT DETECTED Final   Proteus species NOT DETECTED NOT DETECTED Final   Salmonella species NOT DETECTED NOT DETECTED Final   Serratia marcescens NOT DETECTED NOT DETECTED Final   Haemophilus influenzae NOT DETECTED NOT DETECTED Final   Neisseria meningitidis NOT DETECTED NOT DETECTED Final   Pseudomonas aeruginosa NOT DETECTED NOT DETECTED Final    Stenotrophomonas maltophilia NOT DETECTED NOT DETECTED Final   Candida albicans NOT DETECTED NOT DETECTED Final   Candida auris NOT DETECTED NOT DETECTED Final   Candida glabrata NOT DETECTED NOT DETECTED Final   Candida krusei NOT DETECTED NOT DETECTED Final   Candida parapsilosis NOT DETECTED NOT DETECTED Final   Candida tropicalis NOT DETECTED NOT DETECTED Final   Cryptococcus neoformans/gattii NOT DETECTED NOT DETECTED Final    Comment: Performed at [facility], [address], [place], [place] [address]  Resp Panel by RT-PCR (Flu A&B, Covid) Nasopharyngeal Swab     Status: None   Collection Time: 08/08/21  3:00 PM   Specimen: Nasopharyngeal Swab; Nasopharyngeal(NP) swabs in vial transport medium  Result Value Ref Range Status   SARS Coronavirus 2 by RT PCR NEGATIVE NEGATIVE Final    Comment: (NOTE) SARS-CoV-2 target nucleic acids are NOT DETECTED.  The SARS-CoV-2 RNA is generally detectable in upper respiratory specimens during the acute phase of infection. The lowest concentration of SARS-CoV-2 viral copies this assay can detect is 138 copies/mL. A negative result does not preclude SARS-Cov-2 infection and should not be used as the sole basis for treatment or other patient management decisions. A negative result may occur with  improper specimen collection/handling, submission of specimen other than nasopharyngeal swab, presence of viral mutation(s) within the areas targeted by this assay, and inadequate number of viral copies(<138 copies/mL). A negative result must be combined with clinical observations, patient history, and epidemiological information. The expected result is Negative.  Fact Sheet for Patients:  BloggerCourse.com  Fact Sheet for Healthcare Providers:  SeriousBroker.it  This test is no t yet approved or cleared by the  [place] [employer] and  has been authorized for detection and/or diagnosis of  SARS-CoV-2 by FDA under an TEFL teacher (EUA). This EUA will remain  in effect (meaning this test can be used) for the duration of the COVID-19 declaration under Section 564(b)(1) of the Act, 21 U.S.C.section 360bbb-3(b)(1), unless the authorization is terminated  or revoked sooner.       Influenza A by PCR NEGATIVE NEGATIVE Final   Influenza B by PCR NEGATIVE NEGATIVE Final    Comment: (NOTE) The Xpert Xpress SARS-CoV-2/FLU/RSV plus assay is intended as an aid in the diagnosis of influenza from Nasopharyngeal swab specimens and should not be used as a sole basis for treatment. Nasal washings and aspirates are unacceptable for Xpert Xpress SARS-CoV-2/FLU/RSV testing.  Fact Sheet for Patients: BloggerCourse.com  Fact Sheet for Healthcare Providers: SeriousBroker.it  This test is not yet approved or cleared by the [place] FDA and has been authorized for detection and/or diagnosis of SARS-CoV-2 by FDA under an Emergency Use Authorization (EUA). This EUA will remain in effect (meaning this test can be used) for the duration of the COVID-19 declaration under Section 564(b)(1) of the Act, 21 U.S.C. section 360bbb-3(b)(1), unless the authorization is terminated or revoked.  Performed at [facility], [address], [place], [place] [address]   Aerobic/Anaerobic Culture w Gram Stain (surgical/deep wound)     Status: None (Preliminary result)   Collection Time: 08/09/21  6:18 PM   Specimen: Abscess  Result Value Ref Range Status   Specimen Description   Final    ABSCESS RIGHT BIG TOE Performed at [facility], [address], [place], [place] [address]    Special Requests   Final    NONE Performed at [facility], [address] [place], [place] [address]    Gram Stain   Final    RARE WBC PRESENT, PREDOMINANTLY MONONUCLEAR ABUNDANT GRAM POSITIVE  COCCI IN PAIRS ABUNDANT GRAM POSITIVE COCCI IN CLUSTERS    Culture   Final    TOO YOUNG TO READ Performed at [facility], [address], [place], [place] [address]    Report Status PENDING  Incomplete    Studies/Results: MRI Right foot without contrast  Result Date: 08/08/2021 CLINICAL DATA:  Right great toe pain. EXAM: MRI OF THE RIGHT FOREFOOT WITHOUT CONTRAST TECHNIQUE: Multiplanar, multisequence MR imaging of the right forefoot was performed. No intravenous contrast was administered. COMPARISON:  Right great toe x-rays from same day. Right foot x-rays dated July 13, 2021. FINDINGS: Bones/Joint/Cartilage Abnormal marrow edema with corresponding decreased T1 marrow signal involving the first proximal and distal phalanges. Similar signal abnormality involving the second distal phalanx. Pathologic fracture of the base of the first distal phalanx. No dislocation. Mild hallux valgus deformity and first MTP joint osteoarthritis. Large first IP joint effusion. Ligaments The first IP joint collateral ligaments are not visualized. Muscles and Tendons The flexor pollicis longus tendon is completely torn and retracted to the mid first metatarsal (series 9, image 23). Associated tenosynovitis. Increased T2 signal within and atrophy of the intrinsic muscles of the forefoot, nonspecific, but likely related to diabetic muscle changes. Soft tissue Diffuse soft tissue swelling. Ulceration at the plantar aspect of the first IP joint communicating with the joint space (series 9, image 23). 1.8 x 0.9 x 3.5 cm curvilinear gas and fluid collection along the dorsal and lateral aspect of the great toe, consistent with abscess (series 6, image 18). 1.8 x 1.1 x  1.1 cm fluid collection between the first and second metatarsals (series 7, image 15). Additional multi loculated fluid collection in the superficial dorsal foot overlying the second and third mid metatarsals, measuring 2.7 x 1.8 x 0.7 cm (series 9, image  13). Soft tissue ulceration at the tip of the second toe extending to bone. IMPRESSION: 1. Ulceration at the plantar aspect of the first IP joint communicating with the joint space with underlying first IP joint septic arthritis and osteomyelitis of the first proximal and distal phalanges. Pathologic fracture of the base of the first distal phalanx. 2. Ulceration at the tip of the second toe with underlying osteomyelitis of the second distal phalanx. 3. Complete tear of the flexor pollicis longus tendon with retraction to the mid first metatarsal. Associated infectious tenosynovitis. 4. Multiple forefoot abscesses as described above, the largest a curvilinear abscess involving the dorsal and lateral aspect of the great toe, measuring up to 3.5 cm. Electronically Signed   By: Obie Dredge M.D.   On: 08/08/2021 17:37   MR FOOT LEFT WO CONTRAST  Result Date: 08/09/2021 CLINICAL DATA:  Diabetic foot ulcer EXAM: MRI OF THE LEFT FOOT WITHOUT CONTRAST TECHNIQUE: Multiplanar, multisequence MR imaging of the left forefoot was performed. No intravenous contrast was administered. COMPARISON:  X-ray 08/08/2021 FINDINGS: Bones/Joint/Cartilage No acute fracture or dislocation. No bone marrow edema or periostitis. No focal erosion or marrow replacement. The second toe PIP joint is fused. Mild degenerative changes throughout the left forefoot, most notably involving the first MTP joint. Moderate arthropathy of the midfoot. Small first MTP joint effusion, nonspecific. Ligaments Intact Lisfranc ligament. Collateral ligaments of the forefoot appear intact. Muscles and Tendons Intact flexor and extensor tendons. Denervation changes of the intrinsic foot musculature. Soft tissues Shallow soft tissue ulceration overlies the plantar aspect of the great toe distal phalanx. Mild associated soft tissue swelling and skin thickening. No organized fluid collection. IMPRESSION: 1. No evidence of acute osteomyelitis of the left forefoot.  2. Shallow soft tissue ulceration overlies the plantar aspect of the great toe distal phalanx. Mild associated soft tissue swelling and skin thickening. No organized fluid collection. 3. Mild degenerative changes throughout the left forefoot, most notably involving the first MTP joint. Moderate arthropathy of the midfoot. Electronically Signed   By: Duanne Guess D.O.   On: 08/09/2021 17:12   DG Foot 2 Views Right  Result Date: 08/09/2021 CLINICAL DATA:  Post right foot surgery. EXAM: RIGHT FOOT - 2 VIEW COMPARISON:  08/08/2021 FINDINGS: Interval amputation of the right first toe at the mid metatarsal level. Interval amputation of the right second toe at the distal interphalangeal joint level. Margins of resection appear intact. Soft tissue gas in the area of resection is consistent with recent surgery. Degenerative changes in the intertarsal joints. Small calcaneal spurs. IMPRESSION: Interval amputation of the right first toe at the mid metatarsal level and of the right second toe at the distal interphalangeal joint level. Electronically Signed   By: Burman Nieves M.D.   On: 08/09/2021 20:31   DG Foot Complete Left  Result Date: 08/08/2021 CLINICAL DATA:  Ulcer at the base of the first toe. EXAM: LEFT FOOT - COMPLETE 3+ VIEW COMPARISON:  MRI right toe 08/08/2021.  Right toe x-ray 08/08/2021. FINDINGS: There is soft tissue swelling of the first toe. There is no radiopaque foreign body. There is no underlying cortical erosion. There is no acute fracture or dislocation. Bony fusion is identified across the second proximal interphalangeal joint. Plantar and posterior calcaneal  spurs are present. There are some nonspecific soft tissue calcifications along the plantar surface of the calcaneus. There is also degenerative spurring of the dorsal midfoot. IMPRESSION: 1. Soft tissue swelling of the left first toe. 2. No acute bony abnormality. 3. Degenerative changes of the left foot. Electronically Signed   By:  Darliss Cheney M.D.   On: 08/08/2021 20:31   DG Toe Great Right  Result Date: 08/08/2021 CLINICAL DATA:  First toe pain and underlying infection EXAM: RIGHT GREAT TOE COMPARISON:  07/13/2021 FINDINGS: There is now seen considerable lucency within the base of the first distal phalanx as well as fragmentation. Soft tissue wound is again identified. Soft tissue wound is also noted at the tip of the second distal phalanx with some suspicious findings of bony erosion. These findings are both consistent with osteomyelitis. The lucency seen previously at the medial base of the first proximal phalanx is again seen and stable. Hallux valgus deformity is again noted. IMPRESSION: Increased lucency and fragmentation at the base of the first distal phalanx consistent with osteomyelitis. Overlying soft tissue wound is again seen. A wound is noted at the tip of the second toe as well as some bony erosive changes in the tip of the distal phalanx suspicious for osteomyelitis. Electronically Signed   By: Alcide Clever M.D.   On: 08/08/2021 15:58   VAS [place] ABI WITH/WO TBI  Result Date: 08/09/2021  LOWER EXTREMITY DOPPLER STUDY Patient Name:  [name] [name]  Date of Exam:   08/09/2021 Medical Rec #: [id number]       Accession #:    [id number] Date of Birth: [birth year]       Patient Gender: M Patient Age:   74 years Exam Location:  [facility] Procedure:      VAS [place] ABI WITH/WO TBI Referring Phys: Navayah Sok VAN DAM --------------------------------------------------------------------------------  Indications: Ulceration, and Bilateral DM toe ulcers/infection. High Risk Factors: Hypertension, hyperlipidemia, Diabetes, no history of                    smoking. Other Factors: CAD S/P CABGx5.  Comparison Study: No previous exams Performing Technologist: Hill, Jody RVT, RDMS  Examination Guidelines: A complete evaluation includes at minimum, Doppler waveform signals and systolic blood pressure reading at the level of bilateral  brachial, anterior tibial, and posterior tibial arteries, when vessel segments are accessible. Bilateral testing is considered an integral part of a complete examination. Photoelectric Plethysmograph (PPG) waveforms and toe systolic pressure readings are included as required and additional duplex testing as needed. Limited examinations for reoccurring indications may be performed as noted.  ABI Findings: +---------+------------------+-----+---------+--------+ Right    Rt Pressure (mmHg)IndexWaveform Comment  +---------+------------------+-----+---------+--------+ Brachial 163                    triphasic         +---------+------------------+-----+---------+--------+ PTA      200               1.22 triphasic         +---------+------------------+-----+---------+--------+ DP       209               1.27 triphasic         +---------+------------------+-----+---------+--------+ Great Toe186               1.13 Normal            +---------+------------------+-----+---------+--------+ +---------+------------------+-----+---------+-------+ Left     Lt Pressure (mmHg)IndexWaveform Comment +---------+------------------+-----+---------+-------+ Brachial 164  triphasic        +---------+------------------+-----+---------+-------+ PTA      194               1.18 triphasic        +---------+------------------+-----+---------+-------+ DP       176               1.07 triphasic        +---------+------------------+-----+---------+-------+ Great Toe121               0.74 Normal           +---------+------------------+-----+---------+-------+ +-------+-----------+-----------+------------+------------+ ABI/TBIToday's ABIToday's TBIPrevious ABIPrevious TBI +-------+-----------+-----------+------------+------------+ Right  1.27       1.13                                +-------+-----------+-----------+------------+------------+ Left   1.18        0.74                                +-------+-----------+-----------+------------+------------+ For TBIs - 2nd digit used due to both great toes having open wounds and bandaged.  Summary: Right: Resting right ankle-brachial index is within normal range. No evidence of significant right lower extremity arterial disease. The right toe-brachial index is normal. Left: Resting left ankle-brachial index is within normal range. No evidence of significant left lower extremity arterial disease. The left toe-brachial index is normal.  *See table(s) above for measurements and observations.  Electronically signed by Waverly Ferrari MD on 08/09/2021 at 4:04:59 PM.    Final    US Abdomen Limited RUQ (LIVER/GB)  Result Date: 08/08/2021 CLINICAL DATA:  Elevated LFTs EXAM: ULTRASOUND ABDOMEN LIMITED RIGHT UPPER QUADRANT COMPARISON:  None. FINDINGS: Gallbladder: Gallbladder is well distended with multiple gallstones within. Negative sonographic Murphy's sign is elicited. No pericholecystic fluid or wall thickening is noted. Common bile duct: Diameter: 3.4 mm. Liver: No focal lesion identified. Within normal limits in parenchymal echogenicity. Portal vein is patent on color Doppler imaging with normal direction of blood flow towards the liver. Other: None. IMPRESSION: Cholelithiasis without complicating factors. Electronically Signed   By: Alcide Clever M.D.   On: 08/08/2021 22:09      Assessment/Plan:  INTERVAL HISTORY: Patient is now bacteremic with Streptococcus intermedius   Active Problems:   Toe infection   Cellulitis of right lower extremity   Osteomyelitis of great toe of right foot (HCC)   Osteomyelitis of second toe of right foot (HCC)   Diabetes mellitus due to underlying condition with diabetic autonomic neuropathy, without long-term current use of insulin (HCC)   Class 3 severe obesity with serious comorbidity and body mass index (BMI) of 45.0 to 49.9 in adult [facility])    [name] [name] is a 65  y.o. male with diabetes mellitus obesity, coronary artery disease who has been struggling with bilateral diabetic foot ulcers now admitted with acute worsening of drainage from his right great toe also with onset of erythema consistent with cellulitis in his left lower extremity.  Plain films of shown osteomyelitis in the still phalanx of the great toe and second toe on the right.  RI shows the ulceration that is obvious on exam with communication of the joint space and first IP joint with septic arthritis and osteomyelitis of first proximal and distal phalanges with pathological fracture of the base of the first distal phalanx with ulceration of the  tip of the second toe with underlying osteomyelitis the second distal phalanx tear of the flexor pollicis longus and infectious tenosynovitis with multiple forefoot abscess  [name] [name] also has an ulcer in his left great toe as well  Fortunately MRI did not show evidence of osteomyelitis in that foot  #1 right diabetic foot infection with multiple foot abscesses and osteomyelitis involving the first and second toe status post amputation first toe and partial amputation second toe  Cefazolin was started last night which we have changed to ceftriaxone based on his positive blood cultures  We will follow-up operative cultures  #2 left foot ulcer: Fortunately no osteomyelitis in the side on MRI  #3 Streptococcus intermedius bacteremia: Presumably source is his diabetic foot infection on the right side where [name] [name] also has gram-positive cocci seen from culture data.  This is not a typical organism to cause cellulitis.  We have changed him over to ceftriaxone I am ordering an echocardiogram and will repeat blood cultures tomorrow  #4 left lower extremity cellulitis: This is improved after cefazolin was started   I spent with the patient including face to face counseling of the patient his wife regarding the nature of osteomyelitis positive blood cultures  work-up for potential endocarditis, personally reviewing I of his left foot, intraoperative cultures blood cultures CBC BMP along with review of medical records before and during the visit and in coordination of his care.    LOS: 2 days   Acey Lav 08/10/2021, 12:41 PM

## 2021-08-10 NOTE — Anesthesia Postprocedure Evaluation (Signed)
Anesthesia Post Note  Patient: [name] [name]  Procedure(s) Performed: AMPUTATION DIGIT (Right)     Patient location during evaluation: PACU Anesthesia Type: MAC Level of consciousness: awake and alert Pain management: pain level controlled Vital Signs Assessment: post-procedure vital signs reviewed and stable Respiratory status: spontaneous breathing and respiratory function stable Cardiovascular status: stable Postop Assessment: no apparent nausea or vomiting Anesthetic complications: no   No notable events documented.               Zareya Tuckett DANIEL

## 2021-08-10 NOTE — Progress Notes (Signed)
PROGRESS NOTE    [name] [name]  RCV:[id number] DOB: 1956-07-11 DOA: 08/08/2021 PCP: Tally Joe, MD    Chief Complaint  Patient presents with   Wound Infection    Brief Narrative:  65 year old gentleman prior history of hypertension, type 2 diabetes, hyperlipidemia presents to the hospital with left lower extremity cellulitis as well as right great toe nonhealing wound with pain and tenderness.  Patient reports that over the last 1 week prior to admission patient had worsening erythema pain and foul-smelling discharge from the right toe.  He follows up with wound care.  Podiatry and ID were consulted and patient underwent partial first ray amputation of the right foot, partial second toe amputation and debridement of the deep abscess in the right foot by Dr. Loreta Ave on 08/09/2021.  ID on board patient was started on IV Rocephin.    Assessment & Plan:   Active Problems:   Toe infection   Cellulitis of right lower extremity   Osteomyelitis of great toe of right foot (HCC)   Osteomyelitis of second toe of right foot (HCC)   Diabetes mellitus due to underlying condition with diabetic autonomic neuropathy, without long-term current use of insulin (HCC)   Class 3 severe obesity with serious comorbidity and body mass index (BMI) of 45.0 to 49.9 in adult [facility])   Right foot ulcer, MRI on admission of the right foot was concerning for septic arthritis and osteomyelitis of the first proximal and distal phalanges as well as osteomyelitis at the second distal phalanx. It also showed infectious tenosynovitis around the first metatarsal and multiple forefoot abscesses.  Podiatry consulted and he underwent partial first ray amputation, partial second toe amputation and debridement of the deep abscess of the right foot by Dr. Loreta Ave on 08/09/2021. Is currently on IV Rocephin which will be continued.  ID on board and appreciate recommendations. MRI of the left foot does not show any  osteomyelitis. Leukocytosis is worsening this am.  Cultures from the right foot shows abundant gram positive cocci in pairs and clusters.    Left lower extremity cellulitis Continue with IV Rocephin and start the patient on IV Lasix 20 mg daily.   Elevated liver enzymes Same patient currently denies any nausea and vomiting, continue to monitor.  Right upper quadrant ultrasound showed cholelithiasis.  Patient is currently asymptomatic.   Stage IIIa CKD Baseline creatinine around 1.5-1.7 over the last 7 years Patient's current creatinine better than baseline.   Type 2 diabetes mellitus with hyperglycemia and with CKD Continue with sliding scale insulin. CBG (last 3)  Recent Labs    08/09/21 1924 08/09/21 2122 08/10/21 0751  GLUCAP 208* 275* 172*  Last hemoglobin A1c around 6.2 Patient is on Ozempic at home which is on hold.  Will add 3 units TIDAC.    Hyperlipidemia Statin on hold due to elevated liver enzymes.      DVT prophylaxis: (Lovenox) Code Status: (Full code) Family Communication: (None at bedside).  Disposition:   Status is: Inpatient  Remains inpatient appropriate because:Ongoing diagnostic testing needed not appropriate for outpatient work up and IV treatments appropriate due to intensity of illness or inability to take PO  Dispo: The patient is from: Home              Anticipated d/c is to: Home              Patient currently is not medically stable to d/c.   Difficult to place patient No  Consultants:  Podiatry  ID.   Procedures:  Partial 1st ray amputation right foot; partial 2nd toe amputation, I&D deep abscess right foot.  by dr Ardelle Anton on 08/09/21  Antimicrobials:  Antibiotics Given (last 72 hours)     Date/Time Action Medication Dose Rate   08/09/21 2206 New Bag/Given   ceFAZolin (ANCEF) IVPB 2g/100 mL premix 2 g 200 mL/hr   08/10/21 0528 New Bag/Given   ceFAZolin (ANCEF) IVPB 2g/100 mL premix 2 g 200 mL/hr          Subjective: Pain controlled. No chest pain or sob. No nausea, vomiting.  Objective: Vitals:   08/09/21 2000 08/10/21 0000 08/10/21 0403 08/10/21 0757  BP: 138/79 127/60 (!) 141/77 (!) 153/76  Pulse: 71 66 70 75  Resp: 15 17 18 16   Temp: 98 F (36.7 C) 97.8 F (36.6 C) 98.1 F (36.7 C) 98.7 F (37.1 C)  TempSrc: Oral Oral Oral Oral  SpO2: 97% 94% 97% 99%  Weight:      Height:        Intake/Output Summary (Last 24 hours) at 08/10/2021 1013 Last data filed at 08/10/2021 0529 Gross per 24 hour  Intake 1710 ml  Output 2700 ml  Net -990 ml   Filed Weights   08/08/21 1149 08/08/21 1738 08/09/21 1625  Weight: 112.9 kg 118.6 kg 118.6 kg    Examination:  General exam: Appears calm and comfortable  Respiratory system: Clear to auscultation. Respiratory effort normal. Cardiovascular system: S1 & S2 heard, RRR. No JVD,   Gastrointestinal system: Abdomen is nondistended, soft and nontender. Normal bowel sounds heard. Central nervous system: Alert and oriented. No focal neurological deficits. Extremities: RIGHT FOOT BANDAGED. Left leg with erythema and swelling.  Skin: No rashes, lesions or ulcers Psychiatry: Mood & affect appropriate.     Data Reviewed: I have personally reviewed following labs and imaging studies  CBC: Recent Labs  Lab 08/08/21 1307 08/08/21 1807 08/09/21 0546 08/10/21 0503  WBC 19.2* 18.0* 20.2* 24.8*  NEUTROABS 17.0*  --   --   --   HGB 13.4 12.0* 12.5* 12.1*  HCT 40.3 35.2* 36.9* 35.6*  MCV 87.6 85.9 85.2 86.6  PLT 295 277 255 271    Basic Metabolic Panel: Recent Labs  Lab 08/08/21 1307 08/08/21 1807 08/09/21 0546 08/10/21 0503  NA 139  --  135 134*  K 3.9  --  3.5 3.7  CL 102  --  99 97*  CO2 24  --  25 28  GLUCOSE 153*  --  195* 191*  BUN 35*  --  27* 30*  CREATININE 1.63* 1.51* 1.21 1.25*  CALCIUM 7.6* 6.9* 7.5* 8.3*    GFR: Estimated Creatinine Clearance: 80.4 mL/min (A) (by C-G formula based on SCr of 1.25 mg/dL  (H)).  Liver Function Tests: Recent Labs  Lab 08/08/21 1307 08/09/21 0546  AST 63* 29  ALT 88* 64*  ALKPHOS 82 81  BILITOT 1.1 1.2  PROT 7.3 6.4*  ALBUMIN 3.2* 2.7*    CBG: Recent Labs  Lab 08/09/21 1142 08/09/21 1615 08/09/21 1924 08/09/21 2122 08/10/21 0751  GLUCAP 244* 165* 208* 275* 172*     Recent Results (from the past 240 hour(s))  Blood Cultures x 2 sites     Status: None (Preliminary result)   Collection Time: 08/08/21  1:07 PM   Specimen: BLOOD  Result Value Ref Range Status   Specimen Description   Final    BLOOD RIGHT ANTECUBITAL Performed at [facility], [address] [place], [place] [address]    Special Requests   Final    BOTTLES DRAWN AEROBIC AND ANAEROBIC Blood Culture results may not be optimal due to an excessive volume of blood received in culture bottles Performed at [facility], [address] [place], [place] [address]    Culture  Setup Time   Final    GRAM POSITIVE COCCI ANAEROBIC BOTTLE ONLY Organism ID to follow CRITICAL RESULT CALLED TO, READ BACK BY AND VERIFIED WITH: Janine Limbo PHARMD [id number] 08/09/21 A BROWNING Performed at [facility], [address] [place], [place] [address]    Culture GRAM POSITIVE COCCI  Final   Report Status PENDING  Incomplete  Blood Cultures x 2 sites     Status: None (Preliminary result)   Collection Time: 08/08/21  1:07 PM   Specimen: BLOOD  Result Value Ref Range Status   Specimen Description   Final    BLOOD BLOOD RIGHT FOREARM Performed at [facility], [address] [place], [place] [address]    Special Requests   Final    BOTTLES DRAWN AEROBIC AND ANAEROBIC Blood Culture results may not be optimal due to an excessive volume of blood received in culture bottles Performed at [facility], [address], [place], [place] [address]    Culture  Setup Time   Final    GRAM POSITIVE COCCI ANAEROBIC BOTTLE  ONLY CRITICAL VALUE NOTED.  VALUE IS CONSISTENT WITH PREVIOUSLY REPORTED AND CALLED VALUE. Performed at [facility], [address], [place], [place] [address]    Culture GRAM POSITIVE COCCI  Final   Report Status PENDING  Incomplete  Blood Culture ID Panel (Reflexed)     Status: Abnormal   Collection Time: 08/08/21  1:07 PM  Result Value Ref Range Status   Enterococcus faecalis NOT DETECTED NOT DETECTED Final   Enterococcus Faecium NOT DETECTED NOT DETECTED Final   Listeria monocytogenes NOT DETECTED NOT DETECTED Final   Staphylococcus species NOT DETECTED NOT DETECTED Final   Staphylococcus aureus (BCID) NOT DETECTED NOT DETECTED Final   Staphylococcus epidermidis NOT DETECTED NOT DETECTED Final   Staphylococcus lugdunensis NOT DETECTED NOT DETECTED Final   Streptococcus species DETECTED (A) NOT DETECTED Final    Comment: Not Enterococcus species, Streptococcus agalactiae, Streptococcus pyogenes, or Streptococcus pneumoniae. CRITICAL RESULT CALLED TO, READ BACK BY AND VERIFIED WITH: Janine Limbo PHARMD [id number] 08/09/21 A BROWNING    Streptococcus agalactiae NOT DETECTED NOT DETECTED Final   Streptococcus pneumoniae NOT DETECTED NOT DETECTED Final   Streptococcus pyogenes NOT DETECTED NOT DETECTED Final   A.calcoaceticus-baumannii NOT DETECTED NOT DETECTED Final   Bacteroides fragilis NOT DETECTED NOT DETECTED Final   Enterobacterales NOT DETECTED NOT DETECTED Final   Enterobacter cloacae complex NOT DETECTED NOT DETECTED Final   Escherichia coli NOT DETECTED NOT DETECTED Final   Klebsiella aerogenes NOT DETECTED NOT DETECTED Final   Klebsiella oxytoca NOT DETECTED NOT DETECTED Final   Klebsiella pneumoniae NOT DETECTED NOT DETECTED Final   Proteus species NOT DETECTED NOT DETECTED Final   Salmonella species NOT DETECTED NOT DETECTED Final   Serratia marcescens NOT DETECTED NOT DETECTED Final   Haemophilus influenzae NOT DETECTED NOT DETECTED Final   Neisseria meningitidis NOT  DETECTED NOT DETECTED Final   Pseudomonas aeruginosa NOT DETECTED NOT DETECTED Final   Stenotrophomonas maltophilia NOT DETECTED NOT DETECTED Final   Candida albicans NOT DETECTED NOT DETECTED Final   Candida auris NOT DETECTED NOT DETECTED  Final   Candida glabrata NOT DETECTED NOT DETECTED Final   Candida krusei NOT DETECTED NOT DETECTED Final   Candida parapsilosis NOT DETECTED NOT DETECTED Final   Candida tropicalis NOT DETECTED NOT DETECTED Final   Cryptococcus neoformans/gattii NOT DETECTED NOT DETECTED Final    Comment: Performed at [facility], [address], [place], [place] [address]  Resp Panel by RT-PCR (Flu A&B, Covid) Nasopharyngeal Swab     Status: None   Collection Time: 08/08/21  3:00 PM   Specimen: Nasopharyngeal Swab; Nasopharyngeal(NP) swabs in vial transport medium  Result Value Ref Range Status   SARS Coronavirus 2 by RT PCR NEGATIVE NEGATIVE Final    Comment: (NOTE) SARS-CoV-2 target nucleic acids are NOT DETECTED.  The SARS-CoV-2 RNA is generally detectable in upper respiratory specimens during the acute phase of infection. The lowest concentration of SARS-CoV-2 viral copies this assay can detect is 138 copies/mL. A negative result does not preclude SARS-Cov-2 infection and should not be used as the sole basis for treatment or other patient management decisions. A negative result may occur with  improper specimen collection/handling, submission of specimen other than nasopharyngeal swab, presence of viral mutation(s) within the areas targeted by this assay, and inadequate number of viral copies(<138 copies/mL). A negative result must be combined with clinical observations, patient history, and epidemiological information. The expected result is Negative.  Fact Sheet for Patients:  BloggerCourse.com  Fact Sheet for Healthcare Providers:  SeriousBroker.it  This test is no t yet approved or cleared by  the [place] FDA and  has been authorized for detection and/or diagnosis of SARS-CoV-2 by FDA under an Emergency Use Authorization (EUA). This EUA will remain  in effect (meaning this test can be used) for the duration of the COVID-19 declaration under Section 564(b)(1) of the Act, 21 U.S.C.section 360bbb-3(b)(1), unless the authorization is terminated  or revoked sooner.       Influenza A by PCR NEGATIVE NEGATIVE Final   Influenza B by PCR NEGATIVE NEGATIVE Final    Comment: (NOTE) The Xpert Xpress SARS-CoV-2/FLU/RSV plus assay is intended as an aid in the diagnosis of influenza from Nasopharyngeal swab specimens and should not be used as a sole basis for treatment. Nasal washings and aspirates are unacceptable for Xpert Xpress SARS-CoV-2/FLU/RSV testing.  Fact Sheet for Patients: BloggerCourse.com  Fact Sheet for Healthcare Providers: SeriousBroker.it  This test is not yet approved or cleared by the [place] FDA and has been authorized for detection and/or diagnosis of SARS-CoV-2 by FDA under an Emergency Use Authorization (EUA). This EUA will remain in effect (meaning this test can be used) for the duration of the COVID-19 declaration under Section 564(b)(1) of the Act, 21 U.S.C. section 360bbb-3(b)(1), unless the authorization is terminated or revoked.  Performed at [facility], [address] [place], [place] [address]   Aerobic/Anaerobic Culture w Gram Stain (surgical/deep wound)     Status: None (Preliminary result)   Collection Time: 08/09/21  6:18 PM   Specimen: Abscess  Result Value Ref Range Status   Specimen Description   Final    ABSCESS RIGHT BIG TOE Performed at [facility], [address] [place], [place] [address]    Special Requests   Final    NONE Performed at [facility], [address], [place], [place] [address]    Gram Stain    Final    RARE WBC PRESENT, PREDOMINANTLY MONONUCLEAR ABUNDANT GRAM POSITIVE COCCI IN PAIRS ABUNDANT GRAM  POSITIVE COCCI IN CLUSTERS    Culture   Final    TOO YOUNG TO READ Performed at [facility], [address] [place], [place] [address]    Report Status PENDING  Incomplete         Radiology Studies: MRI Right foot without contrast  Result Date: 08/08/2021 CLINICAL DATA:  Right great toe pain. EXAM: MRI OF THE RIGHT FOREFOOT WITHOUT CONTRAST TECHNIQUE: Multiplanar, multisequence MR imaging of the right forefoot was performed. No intravenous contrast was administered. COMPARISON:  Right great toe x-rays from same day. Right foot x-rays dated July 13, 2021. FINDINGS: Bones/Joint/Cartilage Abnormal marrow edema with corresponding decreased T1 marrow signal involving the first proximal and distal phalanges. Similar signal abnormality involving the second distal phalanx. Pathologic fracture of the base of the first distal phalanx. No dislocation. Mild hallux valgus deformity and first MTP joint osteoarthritis. Large first IP joint effusion. Ligaments The first IP joint collateral ligaments are not visualized. Muscles and Tendons The flexor pollicis longus tendon is completely torn and retracted to the mid first metatarsal (series 9, image 23). Associated tenosynovitis. Increased T2 signal within and atrophy of the intrinsic muscles of the forefoot, nonspecific, but likely related to diabetic muscle changes. Soft tissue Diffuse soft tissue swelling. Ulceration at the plantar aspect of the first IP joint communicating with the joint space (series 9, image 23). 1.8 x 0.9 x 3.5 cm curvilinear gas and fluid collection along the dorsal and lateral aspect of the great toe, consistent with abscess (series 6, image 18). 1.8 x 1.1 x 1.1 cm fluid collection between the first and second metatarsals (series 7, image 15). Additional multi loculated fluid collection in the superficial dorsal foot  overlying the second and third mid metatarsals, measuring 2.7 x 1.8 x 0.7 cm (series 9, image 13). Soft tissue ulceration at the tip of the second toe extending to bone. IMPRESSION: 1. Ulceration at the plantar aspect of the first IP joint communicating with the joint space with underlying first IP joint septic arthritis and osteomyelitis of the first proximal and distal phalanges. Pathologic fracture of the base of the first distal phalanx. 2. Ulceration at the tip of the second toe with underlying osteomyelitis of the second distal phalanx. 3. Complete tear of the flexor pollicis longus tendon with retraction to the mid first metatarsal. Associated infectious tenosynovitis. 4. Multiple forefoot abscesses as described above, the largest a curvilinear abscess involving the dorsal and lateral aspect of the great toe, measuring up to 3.5 cm. Electronically Signed   By: Obie Dredge M.D.   On: 08/08/2021 17:37   MR FOOT LEFT WO CONTRAST  Result Date: 08/09/2021 CLINICAL DATA:  Diabetic foot ulcer EXAM: MRI OF THE LEFT FOOT WITHOUT CONTRAST TECHNIQUE: Multiplanar, multisequence MR imaging of the left forefoot was performed. No intravenous contrast was administered. COMPARISON:  X-ray 08/08/2021 FINDINGS: Bones/Joint/Cartilage No acute fracture or dislocation. No bone marrow edema or periostitis. No focal erosion or marrow replacement. The second toe PIP joint is fused. Mild degenerative changes throughout the left forefoot, most notably involving the first MTP joint. Moderate arthropathy of the midfoot. Small first MTP joint effusion, nonspecific. Ligaments Intact Lisfranc ligament. Collateral ligaments of the forefoot appear intact. Muscles and Tendons Intact flexor and extensor tendons. Denervation changes of the intrinsic foot musculature. Soft tissues Shallow soft tissue ulceration overlies the plantar aspect of the great toe distal phalanx. Mild associated soft tissue swelling and skin thickening. No  organized fluid collection. IMPRESSION: 1. No evidence  of acute osteomyelitis of the left forefoot. 2. Shallow soft tissue ulceration overlies the plantar aspect of the great toe distal phalanx. Mild associated soft tissue swelling and skin thickening. No organized fluid collection. 3. Mild degenerative changes throughout the left forefoot, most notably involving the first MTP joint. Moderate arthropathy of the midfoot. Electronically Signed   By: Duanne Guess D.O.   On: 08/09/2021 17:12   DG Foot 2 Views Right  Result Date: 08/09/2021 CLINICAL DATA:  Post right foot surgery. EXAM: RIGHT FOOT - 2 VIEW COMPARISON:  08/08/2021 FINDINGS: Interval amputation of the right first toe at the mid metatarsal level. Interval amputation of the right second toe at the distal interphalangeal joint level. Margins of resection appear intact. Soft tissue gas in the area of resection is consistent with recent surgery. Degenerative changes in the intertarsal joints. Small calcaneal spurs. IMPRESSION: Interval amputation of the right first toe at the mid metatarsal level and of the right second toe at the distal interphalangeal joint level. Electronically Signed   By: Burman Nieves M.D.   On: 08/09/2021 20:31   DG Foot Complete Left  Result Date: 08/08/2021 CLINICAL DATA:  Ulcer at the base of the first toe. EXAM: LEFT FOOT - COMPLETE 3+ VIEW COMPARISON:  MRI right toe 08/08/2021.  Right toe x-ray 08/08/2021. FINDINGS: There is soft tissue swelling of the first toe. There is no radiopaque foreign body. There is no underlying cortical erosion. There is no acute fracture or dislocation. Bony fusion is identified across the second proximal interphalangeal joint. Plantar and posterior calcaneal spurs are present. There are some nonspecific soft tissue calcifications along the plantar surface of the calcaneus. There is also degenerative spurring of the dorsal midfoot. IMPRESSION: 1. Soft tissue swelling of the left first toe.  2. No acute bony abnormality. 3. Degenerative changes of the left foot. Electronically Signed   By: Darliss Cheney M.D.   On: 08/08/2021 20:31   DG Toe Great Right  Result Date: 08/08/2021 CLINICAL DATA:  First toe pain and underlying infection EXAM: RIGHT GREAT TOE COMPARISON:  07/13/2021 FINDINGS: There is now seen considerable lucency within the base of the first distal phalanx as well as fragmentation. Soft tissue wound is again identified. Soft tissue wound is also noted at the tip of the second distal phalanx with some suspicious findings of bony erosion. These findings are both consistent with osteomyelitis. The lucency seen previously at the medial base of the first proximal phalanx is again seen and stable. Hallux valgus deformity is again noted. IMPRESSION: Increased lucency and fragmentation at the base of the first distal phalanx consistent with osteomyelitis. Overlying soft tissue wound is again seen. A wound is noted at the tip of the second toe as well as some bony erosive changes in the tip of the distal phalanx suspicious for osteomyelitis. Electronically Signed   By: Alcide Clever M.D.   On: 08/08/2021 15:58   VAS [place] ABI WITH/WO TBI  Result Date: 08/09/2021  LOWER EXTREMITY DOPPLER STUDY Patient Name:  FOSTER FRERICKS  Date of Exam:   08/09/2021 Medical Rec #: [id number]       Accession #:    [id number] Date of Birth: [birth year]       Patient Gender: M Patient Age:   12 years Exam Location:  [facility] Procedure:      VAS [place] ABI WITH/WO TBI Referring Phys: CORNELIUS VAN DAM --------------------------------------------------------------------------------  Indications: Ulceration, and Bilateral DM toe ulcers/infection. High Risk Factors: Hypertension, hyperlipidemia,  Diabetes, no history of                    smoking. Other Factors: CAD S/P CABGx5.  Comparison Study: No previous exams Performing Technologist: Hill, Jody RVT, RDMS  Examination Guidelines: A complete evaluation includes at  minimum, Doppler waveform signals and systolic blood pressure reading at the level of bilateral brachial, anterior tibial, and posterior tibial arteries, when vessel segments are accessible. Bilateral testing is considered an integral part of a complete examination. Photoelectric Plethysmograph (PPG) waveforms and toe systolic pressure readings are included as required and additional duplex testing as needed. Limited examinations for reoccurring indications may be performed as noted.  ABI Findings: +---------+------------------+-----+---------+--------+ Right    Rt Pressure (mmHg)IndexWaveform Comment  +---------+------------------+-----+---------+--------+ Brachial 163                    triphasic         +---------+------------------+-----+---------+--------+ PTA      200               1.22 triphasic         +---------+------------------+-----+---------+--------+ DP       209               1.27 triphasic         +---------+------------------+-----+---------+--------+ Randie Heinz Toe186               1.13 Normal            +---------+------------------+-----+---------+--------+ +---------+------------------+-----+---------+-------+ Left     Lt Pressure (mmHg)IndexWaveform Comment +---------+------------------+-----+---------+-------+ Brachial 164                    triphasic        +---------+------------------+-----+---------+-------+ PTA      194               1.18 triphasic        +---------+------------------+-----+---------+-------+ DP       176               1.07 triphasic        +---------+------------------+-----+---------+-------+ Great Toe121               0.74 Normal           +---------+------------------+-----+---------+-------+ +-------+-----------+-----------+------------+------------+ ABI/TBIToday's ABIToday's TBIPrevious ABIPrevious TBI +-------+-----------+-----------+------------+------------+ Right  1.27       1.13                                 +-------+-----------+-----------+------------+------------+ Left   1.18       0.74                                +-------+-----------+-----------+------------+------------+ For TBIs - 2nd digit used due to both great toes having open wounds and bandaged.  Summary: Right: Resting right ankle-brachial index is within normal range. No evidence of significant right lower extremity arterial disease. The right toe-brachial index is normal. Left: Resting left ankle-brachial index is within normal range. No evidence of significant left lower extremity arterial disease. The left toe-brachial index is normal.  *See table(s) above for measurements and observations.  Electronically signed by Waverly Ferrari MD on 08/09/2021 at 4:04:59 PM.    Final    US Abdomen Limited RUQ (LIVER/GB)  Result Date: 08/08/2021 CLINICAL DATA:  Elevated LFTs EXAM: ULTRASOUND ABDOMEN LIMITED RIGHT UPPER QUADRANT COMPARISON:  None. FINDINGS:  Gallbladder: Gallbladder is well distended with multiple gallstones within. Negative sonographic Murphy's sign is elicited. No pericholecystic fluid or wall thickening is noted. Common bile duct: Diameter: 3.4 mm. Liver: No focal lesion identified. Within normal limits in parenchymal echogenicity. Portal vein is patent on color Doppler imaging with normal direction of blood flow towards the liver. Other: None. IMPRESSION: Cholelithiasis without complicating factors. Electronically Signed   By: Alcide Clever M.D.   On: 08/08/2021 22:09        Scheduled Meds:  amLODipine  10 mg Oral QHS   aspirin  325 mg Oral Daily   buPROPion  300 mg Oral q morning   calcium carbonate  400 mg of elemental calcium Oral BID   enoxaparin (LOVENOX) injection  60 mg Subcutaneous Q24H   irbesartan  300 mg Oral Daily   And   hydrochlorothiazide  25 mg Oral Daily   insulin aspart  0-15 Units Subcutaneous TID WC   insulin aspart  0-5 Units Subcutaneous QHS   metoprolol tartrate  100 mg Oral BID    omega-3 acid ethyl esters  1 g Oral Daily   pantoprazole  40 mg Oral Daily   potassium chloride SA  40 mEq Oral q morning   senna-docusate  2 tablet Oral BID   Vitamin D (Ergocalciferol)  50,000 Units Oral Q7 days   Continuous Infusions:  cefTRIAXone (ROCEPHIN)  IV       LOS: 2 days        Kathlen Mody, MD [facility]   To contact the attending provider between 7A-7P or the covering provider during after hours 7P-7A, please log into the web site [email or web address] and access using universal [place] password for that web site. If you do not have the password, please call the hospital operator.  08/10/2021, 10:13 AM

## 2021-08-11 ENCOUNTER — Encounter (HOSPITAL_BASED_OUTPATIENT_CLINIC_OR_DEPARTMENT_OTHER): Payer: 59 | Admitting: Internal Medicine

## 2021-08-11 DIAGNOSIS — M869 Osteomyelitis, unspecified: Secondary | ICD-10-CM | POA: Diagnosis not present

## 2021-08-11 DIAGNOSIS — E0843 Diabetes mellitus due to underlying condition with diabetic autonomic (poly)neuropathy: Secondary | ICD-10-CM | POA: Diagnosis not present

## 2021-08-11 DIAGNOSIS — R7881 Bacteremia: Secondary | ICD-10-CM | POA: Diagnosis not present

## 2021-08-11 DIAGNOSIS — L03115 Cellulitis of right lower limb: Secondary | ICD-10-CM | POA: Diagnosis not present

## 2021-08-11 DIAGNOSIS — L03116 Cellulitis of left lower limb: Secondary | ICD-10-CM | POA: Diagnosis not present

## 2021-08-11 LAB — CBC WITH DIFFERENTIAL/PLATELET
Abs Immature Granulocytes: 1.11 10*3/uL — ABNORMAL HIGH (ref 0.00–0.07)
Basophils Absolute: 0.1 10*3/uL (ref 0.0–0.1)
Basophils Relative: 0 %
Eosinophils Absolute: 0.1 10*3/uL (ref 0.0–0.5)
Eosinophils Relative: 0 %
HCT: 35.6 % — ABNORMAL LOW (ref 39.0–52.0)
Hemoglobin: 12.1 g/dL — ABNORMAL LOW (ref 13.0–17.0)
Immature Granulocytes: 4 %
Lymphocytes Relative: 6 %
Lymphs Abs: 1.5 10*3/uL (ref 0.7–4.0)
MCH: 28.8 pg (ref 26.0–34.0)
MCHC: 34 g/dL (ref 30.0–36.0)
MCV: 84.8 fL (ref 80.0–100.0)
Monocytes Absolute: 1.7 10*3/uL — ABNORMAL HIGH (ref 0.1–1.0)
Monocytes Relative: 7 %
Neutro Abs: 20.7 10*3/uL — ABNORMAL HIGH (ref 1.7–7.7)
Neutrophils Relative %: 83 %
Platelets: 245 10*3/uL (ref 150–400)
RBC: 4.2 MIL/uL — ABNORMAL LOW (ref 4.22–5.81)
RDW: 14 % (ref 11.5–15.5)
WBC: 25.2 10*3/uL — ABNORMAL HIGH (ref 4.0–10.5)
nRBC: 0 % (ref 0.0–0.2)

## 2021-08-11 LAB — BASIC METABOLIC PANEL
Anion gap: 11 (ref 5–15)
BUN: 27 mg/dL — ABNORMAL HIGH (ref 8–23)
CO2: 25 mmol/L (ref 22–32)
Calcium: 8.9 mg/dL (ref 8.9–10.3)
Chloride: 90 mmol/L — ABNORMAL LOW (ref 98–111)
Creatinine, Ser: 1.17 mg/dL (ref 0.61–1.24)
GFR, Estimated: >60 mL/min (ref >60)
Glucose, Bld: 347 mg/dL — ABNORMAL HIGH (ref 70–99)
Potassium: 4.5 mmol/L (ref 3.5–5.1)
Sodium: 126 mmol/L — ABNORMAL LOW (ref 135–145)

## 2021-08-11 LAB — GLUCOSE, CAPILLARY
Glucose-Capillary: 197 mg/dL — ABNORMAL HIGH (ref 70–99)
Glucose-Capillary: 320 mg/dL — ABNORMAL HIGH (ref 70–99)
Glucose-Capillary: 279 mg/dL — ABNORMAL HIGH (ref 70–99)
Glucose-Capillary: 191 mg/dL — ABNORMAL HIGH (ref 70–99)

## 2021-08-11 LAB — ECHOCARDIOGRAM COMPLETE
Area-P 1/2: 2.93 cm2
Height: 72 in
S' Lateral: 3.5 cm
Weight: 4183.45 oz

## 2021-08-11 LAB — SODIUM, URINE, RANDOM: Sodium, Ur: 63 mmol/L

## 2021-08-11 LAB — TSH: TSH: 0.886 u[IU]/mL (ref 0.350–4.500)

## 2021-08-11 LAB — OSMOLALITY: Osmolality: 288 mOsm/kg (ref 275–295)

## 2021-08-11 MED ORDER — BUPROPION HCL ER (XL) 300 MG PO TB24
300.0000 mg | ORAL_TABLET | Freq: Every day | ORAL | Status: DC
  Administered 2021-08-12 – 2021-08-21 (×10): 300 mg via ORAL
  Filled 2021-08-11 (×10): qty 1

## 2021-08-11 MED ORDER — INSULIN GLARGINE-YFGN 100 UNIT/ML ~~LOC~~ SOLN
10.0000 [IU] | Freq: Every day | SUBCUTANEOUS | Status: DC
  Administered 2021-08-11 – 2021-08-12 (×2): 10 [IU] via SUBCUTANEOUS
  Filled 2021-08-11 (×2): qty 0.1

## 2021-08-11 NOTE — Progress Notes (Signed)
PROGRESS NOTE    [name]  OZH:[id number] DOB: 01/22/1956 DOA: 08/08/2021 PCP: Tally Joe, MD    Chief Complaint  Patient presents with   Wound Infection    Brief Narrative:  65 year old gentleman prior history of hypertension, type 2 diabetes, hyperlipidemia presents to the hospital with left lower extremity cellulitis as well as right great toe nonhealing wound with pain and tenderness.  Patient reports that over the last 1 week prior to admission patient had worsening erythema pain and foul-smelling discharge from the right toe.  He follows up with wound care.  Podiatry and ID were consulted and patient underwent partial first ray amputation of the right foot, partial second toe amputation and debridement of the deep abscess in the right foot by Dr. Loreta Ave on 08/09/2021.  ID on board patient was started on IV Rocephin.    Assessment & Plan:   Active Problems:   Toe infection   Cellulitis of right lower extremity   Osteomyelitis of great toe of right foot (HCC)   Osteomyelitis of second toe of right foot (HCC)   Diabetes mellitus due to underlying condition with diabetic autonomic neuropathy, without long-term current use of insulin (HCC)   Class 3 severe obesity with serious comorbidity and body mass index (BMI) of 45.0 to 49.9 in adult [facility])   Elevated LFTs   Post-operative state   Cellulitis of left lower extremity   Streptococcal bacteremia   Right foot ulcer, MRI on admission of the right foot was concerning for septic arthritis and osteomyelitis of the first proximal and distal phalanges as well as osteomyelitis at the second distal phalanx. It also showed infectious tenosynovitis around the first metatarsal and multiple forefoot abscesses.  Podiatry consulted and he underwent partial first ray amputation, partial second toe amputation and debridement of the deep abscess of the right foot by Dr. Loreta Ave on 08/09/2021. Is currently on IV Rocephin which will be continued.   ID on board and appreciate recommendations. MRI of the left foot does not show any osteomyelitis. Leukocytosis is worsening this am.  Cultures from the right foot shows abundant gram positive cocci in pairs and clusters, possibly streptococcus agalactiae   Streptococcus intermedius bacteremia: from cultures drawn on 08/08/21 Final sensitivities are pending.  Possibly from diabetic foot infection.  Echocardiogram does not show any evidence of valvular vegetations.  Blood cultures are repeated.    Left lower extremity cellulitis Improving.  Continue with IV Rocephin .    Elevated liver enzymes Same patient currently denies any nausea and vomiting, continue to monitor.  Right upper quadrant ultrasound showed cholelithiasis.  Patient is currently asymptomatic.   Stage IIIa CKD Baseline creatinine around 1.5-1.7 over the last 7 years Patient's current creatinine better than baseline.   Type 2 diabetes mellitus with hyperglycemia and with CKD Continue with sliding scale insulin. CBG (last 3)  Recent Labs    08/10/21 2136 08/11/21 0732 08/11/21 1130  GLUCAP 190* 197* 320*   Last hemoglobin A1c around 6.2 Patient is on Ozempic at home which is on hold.  Started him on semglee 10 units daily, and added novolog 3 units TIDAC.    Hyperlipidemia Statin on hold due to elevated liver enzymes.   Hyponatremia:  Possibly from IV lasix. , which was discontinued.  TSH, cortisol, urine sodium and serum osmo ordered for further eval.   Hypertension:  Suboptimally controlled.  Will add hydralazine prn to the amlodipine, avapro and metoprolol   DVT prophylaxis: (Lovenox) Code Status: (Full code) Family  Communication: (None at bedside).  Disposition:   Status is: Inpatient  Remains inpatient appropriate because:Ongoing diagnostic testing needed not appropriate for outpatient work up and IV treatments appropriate due to intensity of illness or inability to take PO  Dispo: The  patient is from: Home              Anticipated d/c is to: Home              Patient currently is not medically stable to d/c.   Difficult to place patient No       Consultants:  Podiatry  ID.   Procedures:  Partial 1st ray amputation right foot; partial 2nd toe amputation, I&D deep abscess right foot.  by dr Ardelle Anton on 08/09/21  Antimicrobials:  Antibiotics Given (last 72 hours)     Date/Time Action Medication Dose Rate   08/09/21 2206 New Bag/Given   ceFAZolin (ANCEF) IVPB 2g/100 mL premix 2 g 200 mL/hr   08/10/21 0528 New Bag/Given   ceFAZolin (ANCEF) IVPB 2g/100 mL premix 2 g 200 mL/hr   08/10/21 1033 New Bag/Given   cefTRIAXone (ROCEPHIN) 2 g in sodium chloride 0.9 % 100 mL IVPB 2 g 200 mL/hr   08/11/21 0949 New Bag/Given   cefTRIAXone (ROCEPHIN) 2 g in sodium chloride 0.9 % 100 mL IVPB 2 g 200 mL/hr         Subjective: No new complaints today.   Objective: Vitals:   08/10/21 1357 08/10/21 2134 08/11/21 0518 08/11/21 1346  BP: (!) 152/91 (!) 166/78 (!) 144/77 (!) 141/68  Pulse: 76 82 74 68  Resp: 16 18  17   Temp: 97.7 F (36.5 C) 98.6 F (37 C) 98.2 F (36.8 C) 98.1 F (36.7 C)  TempSrc: Oral Oral Oral Oral  SpO2: 99% 93% 93% 97%  Weight:      Height:        Intake/Output Summary (Last 24 hours) at 08/11/2021 1510 Last data filed at 08/11/2021 1347 Gross per 24 hour  Intake 700 ml  Output 3350 ml  Net -2650 ml    Filed Weights   08/08/21 1149 08/08/21 1738 08/09/21 1625  Weight: 112.9 kg 118.6 kg 118.6 kg    Examination:  General exam: Appears calm and comfortable  Respiratory system: Clear to auscultation. Respiratory effort normal. Cardiovascular system: S1 & S2 heard, RRR. No JVD,  Gastrointestinal system: Abdomen is nondistended, soft and nontender.  Normal bowel sounds heard. Central nervous system: Alert and oriented. No focal neurological deficits. Extremities: Right foot is bandaged and left lower extremity cellulitis is improving.   Skin: erythema improving.  Psychiatry: Mood & affect appropriate.      Data Reviewed: I have personally reviewed following labs and imaging studies  CBC: Recent Labs  Lab 08/08/21 1307 08/08/21 1807 08/09/21 0546 08/10/21 0503 08/11/21 0846  WBC 19.2* 18.0* 20.2* 24.8* 25.2*  NEUTROABS 17.0*  --   --   --  20.7*  HGB 13.4 12.0* 12.5* 12.1* 12.1*  HCT 40.3 35.2* 36.9* 35.6* 35.6*  MCV 87.6 85.9 85.2 86.6 84.8  PLT 295 277 255 271 245     Basic Metabolic Panel: Recent Labs  Lab 08/08/21 1307 08/08/21 1807 08/09/21 0546 08/10/21 0503 08/11/21 0846  NA 139  --  135 134* 126*  K 3.9  --  3.5 3.7 4.5  CL 102  --  99 97* 90*  CO2 24  --  25 28 25   GLUCOSE 153*  --  195* 191* 347*  BUN 35*  --  27* 30* 27*  CREATININE 1.63* 1.51* 1.21 1.25* 1.17  CALCIUM 7.6* 6.9* 7.5* 8.3* 8.9     GFR: Estimated Creatinine Clearance: 85.9 mL/min (by C-G formula based on SCr of 1.17 mg/dL).  Liver Function Tests: Recent Labs  Lab 08/08/21 1307 08/09/21 0546  AST 63* 29  ALT 88* 64*  ALKPHOS 82 81  BILITOT 1.1 1.2  PROT 7.3 6.4*  ALBUMIN 3.2* 2.7*     CBG: Recent Labs  Lab 08/10/21 1204 08/10/21 1716 08/10/21 2136 08/11/21 0732 08/11/21 1130  GLUCAP 306* 281* 190* 197* 320*      Recent Results (from the past 240 hour(s))  Blood Cultures x 2 sites     Status: Abnormal (Preliminary result)   Collection Time: 08/08/21  1:07 PM   Specimen: BLOOD  Result Value Ref Range Status   Specimen Description   Final    BLOOD RIGHT ANTECUBITAL Performed at [facility], [address] [place], [place] [address]    Special Requests   Final    BOTTLES DRAWN AEROBIC AND ANAEROBIC Blood Culture results may not be optimal due to an excessive volume of blood received in culture bottles Performed at [facility], [address], [place], [place] [address]    Culture  Setup Time   Final    GRAM POSITIVE COCCI ANAEROBIC BOTTLE  ONLY Organism ID to follow CRITICAL RESULT CALLED TO, READ BACK BY AND VERIFIED WITH: Janine Limbo PHARMD [id number] 08/09/21 A BROWNING Performed at [facility], [address] [place], [place] [address]    Culture STREPTOCOCCUS INTERMEDIUS (A)  Final   Report Status PENDING  Incomplete  Blood Cultures x 2 sites     Status: Abnormal (Preliminary result)   Collection Time: 08/08/21  1:07 PM   Specimen: BLOOD  Result Value Ref Range Status   Specimen Description   Final    BLOOD BLOOD RIGHT FOREARM Performed at [facility], [address] [place], [place] [address]    Special Requests   Final    BOTTLES DRAWN AEROBIC AND ANAEROBIC Blood Culture results may not be optimal due to an excessive volume of blood received in culture bottles Performed at [facility], [address], [place], [place] [address]    Culture  Setup Time   Final    GRAM POSITIVE COCCI ANAEROBIC BOTTLE ONLY CRITICAL VALUE NOTED.  VALUE IS CONSISTENT WITH PREVIOUSLY REPORTED AND CALLED VALUE.    Culture (A)  Final    STREPTOCOCCUS INTERMEDIUS SUSCEPTIBILITIES TO FOLLOW Performed at [facility], [address], [place], [place] [address]    Report Status PENDING  Incomplete  Blood Culture ID Panel (Reflexed)     Status: Abnormal   Collection Time: 08/08/21  1:07 PM  Result Value Ref Range Status   Enterococcus faecalis NOT DETECTED NOT DETECTED Final   Enterococcus Faecium NOT DETECTED NOT DETECTED Final   Listeria monocytogenes NOT DETECTED NOT DETECTED Final   Staphylococcus species NOT DETECTED NOT DETECTED Final   Staphylococcus aureus (BCID) NOT DETECTED NOT DETECTED Final   Staphylococcus epidermidis NOT DETECTED NOT DETECTED Final   Staphylococcus lugdunensis NOT DETECTED NOT DETECTED Final   Streptococcus species DETECTED (A) NOT DETECTED Final    Comment: Not Enterococcus species, Streptococcus agalactiae, Streptococcus pyogenes, or Streptococcus  pneumoniae. CRITICAL RESULT CALLED TO, READ BACK BY AND VERIFIED WITH: Janine Limbo PHARMD [id number] 08/09/21 A BROWNING    Streptococcus agalactiae NOT DETECTED NOT DETECTED Final  Streptococcus pneumoniae NOT DETECTED NOT DETECTED Final   Streptococcus pyogenes NOT DETECTED NOT DETECTED Final   A.calcoaceticus-baumannii NOT DETECTED NOT DETECTED Final   Bacteroides fragilis NOT DETECTED NOT DETECTED Final   Enterobacterales NOT DETECTED NOT DETECTED Final   Enterobacter cloacae complex NOT DETECTED NOT DETECTED Final   Escherichia coli NOT DETECTED NOT DETECTED Final   Klebsiella aerogenes NOT DETECTED NOT DETECTED Final   Klebsiella oxytoca NOT DETECTED NOT DETECTED Final   Klebsiella pneumoniae NOT DETECTED NOT DETECTED Final   Proteus species NOT DETECTED NOT DETECTED Final   Salmonella species NOT DETECTED NOT DETECTED Final   Serratia marcescens NOT DETECTED NOT DETECTED Final   Haemophilus influenzae NOT DETECTED NOT DETECTED Final   Neisseria meningitidis NOT DETECTED NOT DETECTED Final   Pseudomonas aeruginosa NOT DETECTED NOT DETECTED Final   Stenotrophomonas maltophilia NOT DETECTED NOT DETECTED Final   Candida albicans NOT DETECTED NOT DETECTED Final   Candida auris NOT DETECTED NOT DETECTED Final   Candida glabrata NOT DETECTED NOT DETECTED Final   Candida krusei NOT DETECTED NOT DETECTED Final   Candida parapsilosis NOT DETECTED NOT DETECTED Final   Candida tropicalis NOT DETECTED NOT DETECTED Final   Cryptococcus neoformans/gattii NOT DETECTED NOT DETECTED Final    Comment: Performed at [facility], [address], [place], [place] [address]  Resp Panel by RT-PCR (Flu A&B, Covid) Nasopharyngeal Swab     Status: None   Collection Time: 08/08/21  3:00 PM   Specimen: Nasopharyngeal Swab; Nasopharyngeal(NP) swabs in vial transport medium  Result Value Ref Range Status   SARS Coronavirus 2 by RT PCR NEGATIVE NEGATIVE Final    Comment: (NOTE) SARS-CoV-2 target nucleic  acids are NOT DETECTED.  The SARS-CoV-2 RNA is generally detectable in upper respiratory specimens during the acute phase of infection. The lowest concentration of SARS-CoV-2 viral copies this assay can detect is 138 copies/mL. A negative result does not preclude SARS-Cov-2 infection and should not be used as the sole basis for treatment or other patient management decisions. A negative result may occur with  improper specimen collection/handling, submission of specimen other than nasopharyngeal swab, presence of viral mutation(s) within the areas targeted by this assay, and inadequate number of viral copies(<138 copies/mL). A negative result must be combined with clinical observations, patient history, and epidemiological information. The expected result is Negative.  Fact Sheet for Patients:  BloggerCourse.com  Fact Sheet for Healthcare Providers:  SeriousBroker.it  This test is no t yet approved or cleared by the [place] FDA and  has been authorized for detection and/or diagnosis of SARS-CoV-2 by FDA under an Emergency Use Authorization (EUA). This EUA will remain  in effect (meaning this test can be used) for the duration of the COVID-19 declaration under Section 564(b)(1) of the Act, 21 U.S.C.section 360bbb-3(b)(1), unless the authorization is terminated  or revoked sooner.       Influenza A by PCR NEGATIVE NEGATIVE Final   Influenza B by PCR NEGATIVE NEGATIVE Final    Comment: (NOTE) The Xpert Xpress SARS-CoV-2/FLU/RSV plus assay is intended as an aid in the diagnosis of influenza from Nasopharyngeal swab specimens and should not be used as a sole basis for treatment. Nasal washings and aspirates are unacceptable for Xpert Xpress SARS-CoV-2/FLU/RSV testing.  Fact Sheet for Patients: BloggerCourse.com  Fact Sheet for Healthcare Providers: SeriousBroker.it  This  test is not yet approved or cleared by the [place] FDA and has been authorized for detection and/or diagnosis of SARS-CoV-2 by FDA  under an Emergency Use Authorization (EUA). This EUA will remain in effect (meaning this test can be used) for the duration of the COVID-19 declaration under Section 564(b)(1) of the Act, 21 U.S.C. section 360bbb-3(b)(1), unless the authorization is terminated or revoked.  Performed at [facility], [address], [place], [place] [address]   Aerobic/Anaerobic Culture w Gram Stain (surgical/deep wound)     Status: None (Preliminary result)   Collection Time: 08/09/21  6:18 PM   Specimen: Abscess  Result Value Ref Range Status   Specimen Description   Final    ABSCESS RIGHT BIG TOE Performed at [facility], [address] [place], [place] [address]    Special Requests   Final    NONE Performed at [facility], [address] [place], [place] [address]    Gram Stain   Final    RARE WBC PRESENT, PREDOMINANTLY MONONUCLEAR ABUNDANT GRAM POSITIVE COCCI IN PAIRS ABUNDANT GRAM POSITIVE COCCI IN CLUSTERS Performed at [facility], [address] [place], [place] [address]    Culture   Final    FEW STREPTOCOCCUS AGALACTIAE TESTING AGAINST S. AGALACTIAE NOT ROUTINELY PERFORMED DUE TO PREDICTABILITY OF AMP/PEN/VAN SUSCEPTIBILITY. MODERATE STREPTOCOCCUS INTERMEDIUS CULTURE REINCUBATED FOR BETTER GROWTH NO ANAEROBES ISOLATED; CULTURE IN PROGRESS FOR 5 DAYS    Report Status PENDING  Incomplete          Radiology Studies: DG Foot 2 Views Right  Result Date: 08/09/2021 CLINICAL DATA:  Post right foot surgery. EXAM: RIGHT FOOT - 2 VIEW COMPARISON:  08/08/2021 FINDINGS: Interval amputation of the right first toe at the mid metatarsal level. Interval amputation of the right second toe at the distal interphalangeal joint level. Margins of resection appear intact. Soft tissue gas in the  area of resection is consistent with recent surgery. Degenerative changes in the intertarsal joints. Small calcaneal spurs. IMPRESSION: Interval amputation of the right first toe at the mid metatarsal level and of the right second toe at the distal interphalangeal joint level. Electronically Signed   By: Burman Nieves M.D.   On: 08/09/2021 20:31   ECHOCARDIOGRAM COMPLETE  Result Date: 08/11/2021    ECHOCARDIOGRAM REPORT   Patient Name:   [name] [name] Date of Exam: 08/10/2021 Medical Rec #:  [id number]      Height:       72.0 in Accession #:    [id number]     Weight:       261.5 lb Date of Birth:  1956-07-26      BSA:          2.388 m Patient Age:    65 years       BP:           152/91 mmHg Patient Gender: M              HR:           75 bpm. Exam Location:  Inpatient Procedure: 2D Echo, Cardiac Doppler and Color Doppler Indications:    bacteremia  History:        Patient has no prior history of Echocardiogram examinations.                 CAD; Risk Factors:Diabetes, Hypertension and Dyslipidemia.  Sonographer:    Celesta Gentile RCS Referring Phys: [phone] CORNELIUS N VAN DAM IMPRESSIONS  1. Left ventricular ejection fraction, by estimation, is 55 to 60%. The left ventricle has normal function. The left ventricle has no  regional wall motion abnormalities. There is mild left ventricular hypertrophy. Left ventricular diastolic parameters are consistent with Grade I diastolic dysfunction (impaired relaxation).  2. Right ventricular systolic function is normal. The right ventricular size is normal. Tricuspid regurgitation signal is inadequate for assessing PA pressure.  3. The mitral valve is grossly normal. Trivial mitral valve regurgitation. No evidence of mitral stenosis.  4. The aortic valve is tricuspid. Aortic valve regurgitation is not visualized. No aortic stenosis is present.  5. The inferior vena cava is normal in size with greater than 50% respiratory variability, suggesting right atrial pressure of 3 mmHg.  Conclusion(s)/Recommendation(s): No evidence of valvular vegetations on this transthoracic echocardiogram. Consider a transesophageal echocardiogram to exclude infective endocarditis if clinically indicated. FINDINGS  Left Ventricle: Left ventricular ejection fraction, by estimation, is 55 to 60%. The left ventricle has normal function. The left ventricle has no regional wall motion abnormalities. The left ventricular internal cavity size was normal in size. There is  mild left ventricular hypertrophy. Left ventricular diastolic parameters are consistent with Grade I diastolic dysfunction (impaired relaxation). Right Ventricle: The right ventricular size is normal. No increase in right ventricular wall thickness. Right ventricular systolic function is normal. Tricuspid regurgitation signal is inadequate for assessing PA pressure. Left Atrium: Left atrial size was normal in size. Right Atrium: Right atrial size was normal in size. Pericardium: There is no evidence of pericardial effusion. Mitral Valve: The mitral valve is grossly normal. Trivial mitral valve regurgitation. No evidence of mitral valve stenosis. Tricuspid Valve: The tricuspid valve is normal in structure. Tricuspid valve regurgitation is trivial. No evidence of tricuspid stenosis. Aortic Valve: The aortic valve is tricuspid. Aortic valve regurgitation is not visualized. No aortic stenosis is present. Pulmonic Valve: The pulmonic valve was normal in structure. Pulmonic valve regurgitation is trivial. No evidence of pulmonic stenosis. Aorta: The aortic root is normal in size and structure. Venous: The inferior vena cava is normal in size with greater than 50% respiratory variability, suggesting right atrial pressure of 3 mmHg. IAS/Shunts: The interatrial septum appears to be lipomatous. No atrial level shunt detected by color flow Doppler.  LEFT VENTRICLE PLAX 2D LVIDd:         5.40 cm  Diastology LVIDs:         3.50 cm  LV e' medial:    5.00 cm/s LV PW:          1.20 cm  LV E/e' medial:  13.6 LV IVS:        1.30 cm  LV e' lateral:   6.74 cm/s LVOT diam:     1.80 cm  LV E/e' lateral: 10.1 LV SV:         58 LV SV Index:   24 LVOT Area:     2.54 cm  RIGHT VENTRICLE RV S prime:     12.80 cm/s TAPSE (M-mode): 1.5 cm LEFT ATRIUM           Index       RIGHT ATRIUM           Index LA diam:      4.00 cm 1.68 cm/m  RA Area:     16.80 cm LA Vol (A4C): 48.4 ml 20.27 ml/m RA Volume:   50.10 ml  20.98 ml/m  AORTIC VALVE LVOT Vmax:   127.00 cm/s LVOT Vmean:  91.500 cm/s LVOT VTI:    0.227 m  AORTA Ao Root diam: 3.70 cm MITRAL VALVE MV Area (PHT): 2.93 cm  SHUNTS MV Decel Time: 259 msec    Systemic VTI:  0.23 m MV E velocity: 68.20 cm/s  Systemic Diam: 1.80 cm MV A velocity: 72.50 cm/s MV E/A ratio:  0.94 Weston Brass MD Electronically signed by Weston Brass MD Signature Date/Time: 08/11/2021/4:40:14 AM    Final         Scheduled Meds:  amLODipine  10 mg Oral QHS   aspirin  325 mg Oral Daily   [START ON 08/12/2021] buPROPion  300 mg Oral Daily   calcium carbonate  400 mg of elemental calcium Oral BID   enoxaparin (LOVENOX) injection  60 mg Subcutaneous Q24H   insulin aspart  0-15 Units Subcutaneous TID WC   insulin aspart  0-5 Units Subcutaneous QHS   insulin aspart  3 Units Subcutaneous TID WC   irbesartan  300 mg Oral Daily   metoprolol tartrate  100 mg Oral BID   omega-3 acid ethyl esters  1 g Oral Daily   pantoprazole  40 mg Oral Daily   potassium chloride SA  40 mEq Oral q morning   senna-docusate  2 tablet Oral BID   Vitamin D (Ergocalciferol)  50,000 Units Oral Q7 days   Continuous Infusions:  cefTRIAXone (ROCEPHIN)  IV 2 g (08/11/21 0949)     LOS: 3 days        Kathlen Mody, MD [facility]   To contact the attending provider between 7A-7P or the covering provider during after hours 7P-7A, please log into the web site [email or web address] and access using universal [place] password for that web site. If you do not have  the password, please call the hospital operator.  08/11/2021, 3:10 PM

## 2021-08-11 NOTE — Progress Notes (Signed)
Subjective:  No new complaints   Antibiotics:  Anti-infectives (From admission, onward)    Start     Dose/Rate Route Frequency Ordered Stop   08/10/21 1000  cefTRIAXone (ROCEPHIN) 2 g in sodium chloride 0.9 % 100 mL IVPB        2 g 200 mL/hr over 30 Minutes Intravenous Every 24 hours 08/10/21 0905     08/10/21 0200  ceFAZolin (ANCEF) IVPB 1 g/50 mL premix  Status:  Discontinued        1 g 100 mL/hr over 30 Minutes Intravenous Every 8 hours 08/09/21 1912 08/09/21 1913   08/09/21 2000  ceFAZolin (ANCEF) IVPB 2g/100 mL premix  Status:  Discontinued        2 g 200 mL/hr over 30 Minutes Intravenous Every 8 hours 08/09/21 1811 08/10/21 0905   08/09/21 1758  ceFAZolin (ANCEF) 2-4 GM/100ML-% IVPB       Note to Pharmacy: Sharyn Creamer   : cabinet override      08/09/21 1758 08/10/21 0559       Medications: Scheduled Meds:  amLODipine  10 mg Oral QHS   aspirin  325 mg Oral Daily   [START ON 08/12/2021] buPROPion  300 mg Oral Daily   calcium carbonate  400 mg of elemental calcium Oral BID   enoxaparin (LOVENOX) injection  60 mg Subcutaneous Q24H   insulin aspart  0-15 Units Subcutaneous TID WC   insulin aspart  0-5 Units Subcutaneous QHS   insulin aspart  3 Units Subcutaneous TID WC   insulin glargine-yfgn  10 Units Subcutaneous Daily   irbesartan  300 mg Oral Daily   metoprolol tartrate  100 mg Oral BID   omega-3 acid ethyl esters  1 g Oral Daily   pantoprazole  40 mg Oral Daily   senna-docusate  2 tablet Oral BID   Vitamin D (Ergocalciferol)  50,000 Units Oral Q7 days   Continuous Infusions:  cefTRIAXone (ROCEPHIN)  IV 2 g (08/11/21 0949)   PRN Meds:.ALPRAZolam, oxyCODONE, polyethylene glycol, traMADol    Objective: Weight change:   Intake/Output Summary (Last 24 hours) at 08/11/2021 1541 Last data filed at 08/11/2021 1347 Gross per 24 hour  Intake 700 ml  Output 3350 ml  Net -2650 ml    Blood pressure (!) 141/68, pulse 68, temperature 98.1 F (36.7 C),  temperature source Oral, resp. rate 17, height 6' (1.829 m), weight 118.6 kg, SpO2 97 %. Temp:  [98.1 F (36.7 C)-98.6 F (37 C)] 98.1 F (36.7 C) (09/22 1346) Pulse Rate:  [68-82] 68 (09/22 1346) Resp:  [17-18] 17 (09/22 1346) BP: (141-166)/(68-78) 141/68 (09/22 1346) SpO2:  [93 %-97 %] 97 % (09/22 1346)  Physical Exam: Physical Exam Constitutional:      Appearance: He is well-developed.  HENT:     Head: Normocephalic and atraumatic.  Eyes:     Conjunctiva/sclera: Conjunctivae normal.  Cardiovascular:     Rate and Rhythm: Normal rate and regular rhythm.     Heart sounds: No murmur heard.   No friction rub. No gallop.  Pulmonary:     Effort: Pulmonary effort is normal. No respiratory distress.     Breath sounds: Normal breath sounds. No stridor. No wheezing.  Abdominal:     General: There is no distension.     Palpations: Abdomen is soft.  Musculoskeletal:        General: Normal range of motion.     Cervical back: Normal range of motion and neck supple.  Skin:    General: Skin is warm and dry.     Findings: Erythema present. No rash.  Neurological:     General: No focal deficit present.     Mental Status: He is alert and oriented to person, place, and time.  Psychiatric:        Mood and Affect: Mood normal.        Behavior: Behavior normal.        Thought Content: Thought content normal.        Judgment: Judgment normal.    Left lower extremity 08/08/2021       Left lower extremity 08/09/2021    Left lower extremity today August 10, 2021:    LLE 08/11/2021:     Toes bandaged    CBC:    BMET Recent Labs    08/10/21 0503 08/11/21 0846  NA 134* 126*  K 3.7 4.5  CL 97* 90*  CO2 28 25  GLUCOSE 191* 347*  BUN 30* 27*  CREATININE 1.25* 1.17  CALCIUM 8.3* 8.9      Liver Panel  Recent Labs    08/09/21 0546  PROT 6.4*  ALBUMIN 2.7*  AST 29  ALT 64*  ALKPHOS 81  BILITOT 1.2        Sedimentation Rate Recent Labs     08/09/21 0546  ESRSEDRATE 44*    C-Reactive Protein Recent Labs    08/09/21 0546  CRP 16.9*     Micro Results: Recent Results (from the past 720 hour(s))  Blood Cultures x 2 sites     Status: Abnormal (Preliminary result)   Collection Time: 08/08/21  1:07 PM   Specimen: BLOOD  Result Value Ref Range Status   Specimen Description   Final    BLOOD RIGHT ANTECUBITAL Performed at [facility], [address] [place], [place] [address]    Special Requests   Final    BOTTLES DRAWN AEROBIC AND ANAEROBIC Blood Culture results may not be optimal due to an excessive volume of blood received in culture bottles Performed at [facility], [address] [place], [place] [address]    Culture  Setup Time   Final    GRAM POSITIVE COCCI ANAEROBIC BOTTLE ONLY Organism ID to follow CRITICAL RESULT CALLED TO, READ BACK BY AND VERIFIED WITH: Janine Limbo PHARMD [id number] 08/09/21 A BROWNING Performed at [facility], [address] [place], [place] [address]    Culture STREPTOCOCCUS INTERMEDIUS (A)  Final   Report Status PENDING  Incomplete  Blood Cultures x 2 sites     Status: Abnormal (Preliminary result)   Collection Time: 08/08/21  1:07 PM   Specimen: BLOOD  Result Value Ref Range Status   Specimen Description   Final    BLOOD BLOOD RIGHT FOREARM Performed at [facility], [address], [place], [place] [address]    Special Requests   Final    BOTTLES DRAWN AEROBIC AND ANAEROBIC Blood Culture results may not be optimal due to an excessive volume of blood received in culture bottles Performed at [facility], [address] [place], [place] [address]    Culture  Setup Time   Final    GRAM POSITIVE COCCI ANAEROBIC BOTTLE ONLY CRITICAL VALUE NOTED.  VALUE IS CONSISTENT WITH PREVIOUSLY REPORTED AND CALLED VALUE.    Culture (A)  Final    STREPTOCOCCUS INTERMEDIUS SUSCEPTIBILITIES TO FOLLOW Performed at  [facility], [address], [place], [place]  [address]    Report Status PENDING  Incomplete  Blood Culture ID Panel (Reflexed)     Status: Abnormal   Collection Time: 08/08/21  1:07 PM  Result Value Ref Range Status   Enterococcus faecalis NOT DETECTED NOT DETECTED Final   Enterococcus Faecium NOT DETECTED NOT DETECTED Final   Listeria monocytogenes NOT DETECTED NOT DETECTED Final   Staphylococcus species NOT DETECTED NOT DETECTED Final   Staphylococcus aureus (BCID) NOT DETECTED NOT DETECTED Final   Staphylococcus epidermidis NOT DETECTED NOT DETECTED Final   Staphylococcus lugdunensis NOT DETECTED NOT DETECTED Final   Streptococcus species DETECTED (A) NOT DETECTED Final    Comment: Not Enterococcus species, Streptococcus agalactiae, Streptococcus pyogenes, or Streptococcus pneumoniae. CRITICAL RESULT CALLED TO, READ BACK BY AND VERIFIED WITH: Janine Limbo PHARMD [id number] 08/09/21 A BROWNING    Streptococcus agalactiae NOT DETECTED NOT DETECTED Final   Streptococcus pneumoniae NOT DETECTED NOT DETECTED Final   Streptococcus pyogenes NOT DETECTED NOT DETECTED Final   A.calcoaceticus-baumannii NOT DETECTED NOT DETECTED Final   Bacteroides fragilis NOT DETECTED NOT DETECTED Final   Enterobacterales NOT DETECTED NOT DETECTED Final   Enterobacter cloacae complex NOT DETECTED NOT DETECTED Final   Escherichia coli NOT DETECTED NOT DETECTED Final   Klebsiella aerogenes NOT DETECTED NOT DETECTED Final   Klebsiella oxytoca NOT DETECTED NOT DETECTED Final   Klebsiella pneumoniae NOT DETECTED NOT DETECTED Final   Proteus species NOT DETECTED NOT DETECTED Final   Salmonella species NOT DETECTED NOT DETECTED Final   Serratia marcescens NOT DETECTED NOT DETECTED Final   Haemophilus influenzae NOT DETECTED NOT DETECTED Final   Neisseria meningitidis NOT DETECTED NOT DETECTED Final   Pseudomonas aeruginosa NOT DETECTED NOT DETECTED Final   Stenotrophomonas maltophilia NOT DETECTED NOT DETECTED  Final   Candida albicans NOT DETECTED NOT DETECTED Final   Candida auris NOT DETECTED NOT DETECTED Final   Candida glabrata NOT DETECTED NOT DETECTED Final   Candida krusei NOT DETECTED NOT DETECTED Final   Candida parapsilosis NOT DETECTED NOT DETECTED Final   Candida tropicalis NOT DETECTED NOT DETECTED Final   Cryptococcus neoformans/gattii NOT DETECTED NOT DETECTED Final    Comment: Performed at [facility], [address], [place], [place] [address]  Resp Panel by RT-PCR (Flu A&B, Covid) Nasopharyngeal Swab     Status: None   Collection Time: 08/08/21  3:00 PM   Specimen: Nasopharyngeal Swab; Nasopharyngeal(NP) swabs in vial transport medium  Result Value Ref Range Status   SARS Coronavirus 2 by RT PCR NEGATIVE NEGATIVE Final    Comment: (NOTE) SARS-CoV-2 target nucleic acids are NOT DETECTED.  The SARS-CoV-2 RNA is generally detectable in upper respiratory specimens during the acute phase of infection. The lowest concentration of SARS-CoV-2 viral copies this assay can detect is 138 copies/mL. A negative result does not preclude SARS-Cov-2 infection and should not be used as the sole basis for treatment or other patient management decisions. A negative result may occur with  improper specimen collection/handling, submission of specimen other than nasopharyngeal swab, presence of viral mutation(s) within the areas targeted by this assay, and inadequate number of viral copies(<138 copies/mL). A negative result must be combined with clinical observations, patient history, and epidemiological information. The expected result is Negative.  Fact Sheet for Patients:  BloggerCourse.com  Fact Sheet for Healthcare Providers:  SeriousBroker.it  This test is no t yet approved or cleared by the [place] FDA and  has been authorized for detection and/or diagnosis of SARS-CoV-2 by FDA under an  Emergency Use Authorization (EUA).  This EUA will remain  in effect (meaning this test can be used) for the duration of the COVID-19 declaration under Section 564(b)(1) of the Act, 21 U.S.C.section 360bbb-3(b)(1), unless the authorization is terminated  or revoked sooner.       Influenza A by PCR NEGATIVE NEGATIVE Final   Influenza B by PCR NEGATIVE NEGATIVE Final    Comment: (NOTE) The Xpert Xpress SARS-CoV-2/FLU/RSV plus assay is intended as an aid in the diagnosis of influenza from Nasopharyngeal swab specimens and should not be used as a sole basis for treatment. Nasal washings and aspirates are unacceptable for Xpert Xpress SARS-CoV-2/FLU/RSV testing.  Fact Sheet for Patients: BloggerCourse.com  Fact Sheet for Healthcare Providers: SeriousBroker.it  This test is not yet approved or cleared by the [place] FDA and has been authorized for detection and/or diagnosis of SARS-CoV-2 by FDA under an Emergency Use Authorization (EUA). This EUA will remain in effect (meaning this test can be used) for the duration of the COVID-19 declaration under Section 564(b)(1) of the Act, 21 U.S.C. section 360bbb-3(b)(1), unless the authorization is terminated or revoked.  Performed at [facility], [address] [place], [place] [address]   Aerobic/Anaerobic Culture w Gram Stain (surgical/deep wound)     Status: None (Preliminary result)   Collection Time: 08/09/21  6:18 PM   Specimen: Abscess  Result Value Ref Range Status   Specimen Description   Final    ABSCESS RIGHT BIG TOE Performed at [facility], [address], [place], [place] [address]    Special Requests   Final    NONE Performed at [facility], [address], [place], [place] [address]    Gram Stain   Final    RARE WBC PRESENT, PREDOMINANTLY MONONUCLEAR ABUNDANT GRAM POSITIVE COCCI IN PAIRS ABUNDANT GRAM POSITIVE COCCI IN CLUSTERS Performed  at [facility], [address], [place], [place] [address]    Culture   Final    FEW STREPTOCOCCUS AGALACTIAE TESTING AGAINST S. AGALACTIAE NOT ROUTINELY PERFORMED DUE TO PREDICTABILITY OF AMP/PEN/VAN SUSCEPTIBILITY. MODERATE STREPTOCOCCUS INTERMEDIUS CULTURE REINCUBATED FOR BETTER GROWTH NO ANAEROBES ISOLATED; CULTURE IN PROGRESS FOR 5 DAYS    Report Status PENDING  Incomplete    Studies/Results: DG Foot 2 Views Right  Result Date: 08/09/2021 CLINICAL DATA:  Post right foot surgery. EXAM: RIGHT FOOT - 2 VIEW COMPARISON:  08/08/2021 FINDINGS: Interval amputation of the right first toe at the mid metatarsal level. Interval amputation of the right second toe at the distal interphalangeal joint level. Margins of resection appear intact. Soft tissue gas in the area of resection is consistent with recent surgery. Degenerative changes in the intertarsal joints. Small calcaneal spurs. IMPRESSION: Interval amputation of the right first toe at the mid metatarsal level and of the right second toe at the distal interphalangeal joint level. Electronically Signed   By: Burman Nieves M.D.   On: 08/09/2021 20:31   ECHOCARDIOGRAM COMPLETE  Result Date: 08/11/2021    ECHOCARDIOGRAM REPORT   Patient Name:   [name] [name] Date of Exam: 08/10/2021 Medical Rec #:  [id number]      Height:       72.0 in Accession #:    [id number]     Weight:       261.5 lb Date of Birth:  25-Aug-1956      BSA:          2.388 m Patient Age:    76  years       BP:           152/91 mmHg Patient Gender: M              HR:           75 bpm. Exam Location:  Inpatient Procedure: 2D Echo, Cardiac Doppler and Color Doppler Indications:    bacteremia  History:        Patient has no prior history of Echocardiogram examinations.                 CAD; Risk Factors:Diabetes, Hypertension and Dyslipidemia.  Sonographer:    Celesta Gentile RCS Referring Phys: [phone] Willim Turnage N [place] IMPRESSIONS  1. Left ventricular ejection fraction, by  estimation, is 55 to 60%. The left ventricle has normal function. The left ventricle has no regional wall motion abnormalities. There is mild left ventricular hypertrophy. Left ventricular diastolic parameters are consistent with Grade I diastolic dysfunction (impaired relaxation).  2. Right ventricular systolic function is normal. The right ventricular size is normal. Tricuspid regurgitation signal is inadequate for assessing PA pressure.  3. The mitral valve is grossly normal. Trivial mitral valve regurgitation. No evidence of mitral stenosis.  4. The aortic valve is tricuspid. Aortic valve regurgitation is not visualized. No aortic stenosis is present.  5. The inferior vena cava is normal in size with greater than 50% respiratory variability, suggesting right atrial pressure of 3 mmHg. Conclusion(s)/Recommendation(s): No evidence of valvular vegetations on this transthoracic echocardiogram. Consider a transesophageal echocardiogram to exclude infective endocarditis if clinically indicated. FINDINGS  Left Ventricle: Left ventricular ejection fraction, by estimation, is 55 to 60%. The left ventricle has normal function. The left ventricle has no regional wall motion abnormalities. The left ventricular internal cavity size was normal in size. There is  mild left ventricular hypertrophy. Left ventricular diastolic parameters are consistent with Grade I diastolic dysfunction (impaired relaxation). Right Ventricle: The right ventricular size is normal. No increase in right ventricular wall thickness. Right ventricular systolic function is normal. Tricuspid regurgitation signal is inadequate for assessing PA pressure. Left Atrium: Left atrial size was normal in size. Right Atrium: Right atrial size was normal in size. Pericardium: There is no evidence of pericardial effusion. Mitral Valve: The mitral valve is grossly normal. Trivial mitral valve regurgitation. No evidence of mitral valve stenosis. Tricuspid Valve: The  tricuspid valve is normal in structure. Tricuspid valve regurgitation is trivial. No evidence of tricuspid stenosis. Aortic Valve: The aortic valve is tricuspid. Aortic valve regurgitation is not visualized. No aortic stenosis is present. Pulmonic Valve: The pulmonic valve was normal in structure. Pulmonic valve regurgitation is trivial. No evidence of pulmonic stenosis. Aorta: The aortic root is normal in size and structure. Venous: The inferior vena cava is normal in size with greater than 50% respiratory variability, suggesting right atrial pressure of 3 mmHg. IAS/Shunts: The interatrial septum appears to be lipomatous. No atrial level shunt detected by color flow Doppler.  LEFT VENTRICLE PLAX 2D LVIDd:         5.40 cm  Diastology LVIDs:         3.50 cm  LV e' medial:    5.00 cm/s LV PW:         1.20 cm  LV E/e' medial:  13.6 LV IVS:        1.30 cm  LV e' lateral:   6.74 cm/s LVOT diam:     1.80 cm  LV E/e' lateral: 10.1 LV SV:  58 LV SV Index:   24 LVOT Area:     2.54 cm  RIGHT VENTRICLE RV S prime:     12.80 cm/s TAPSE (M-mode): 1.5 cm LEFT ATRIUM           Index       RIGHT ATRIUM           Index LA diam:      4.00 cm 1.68 cm/m  RA Area:     16.80 cm LA Vol (A4C): 48.4 ml 20.27 ml/m RA Volume:   50.10 ml  20.98 ml/m  AORTIC VALVE LVOT Vmax:   127.00 cm/s LVOT Vmean:  91.500 cm/s LVOT VTI:    0.227 m  AORTA Ao Root diam: 3.70 cm MITRAL VALVE MV Area (PHT): 2.93 cm    SHUNTS MV Decel Time: 259 msec    Systemic VTI:  0.23 m MV E velocity: 68.20 cm/s  Systemic Diam: 1.80 cm MV A velocity: 72.50 cm/s MV E/A ratio:  0.94 Weston Brass MD Electronically signed by Weston Brass MD Signature Date/Time: 08/11/2021/4:40:14 AM    Final       Assessment/Plan:  INTERVAL HISTORY: TTE without vegetations   Active Problems:   Toe infection   Cellulitis of right lower extremity   Osteomyelitis of great toe of right foot (HCC)   Osteomyelitis of second toe of right foot (HCC)   Diabetes mellitus due  to underlying condition with diabetic autonomic neuropathy, without long-term current use of insulin (HCC)   Class 3 severe obesity with serious comorbidity and body mass index (BMI) of 45.0 to 49.9 in adult [facility])   Elevated LFTs   Post-operative state   Cellulitis of left lower extremity   Streptococcal bacteremia    [name] [name] is a 65 y.o. male with diabetes mellitus obesity, coronary artery disease who has been struggling with bilateral diabetic foot ulcers now admitted with acute worsening of drainage from his right great toe also with onset of erythema consistent with cellulitis in his left lower extremity.  Plain films of shown osteomyelitis in the still phalanx of the great toe and second toe on the right.  RI shows the ulceration that is obvious on exam with communication of the joint space and first IP joint with septic arthritis and osteomyelitis of first proximal and distal phalanges with pathological fracture of the base of the first distal phalanx with ulceration of the tip of the second toe with underlying osteomyelitis the second distal phalanx tear of the flexor pollicis longus and infectious tenosynovitis with multiple forefoot abscess  He also has an ulcer in his left great toe as well  Fortunately MRI did not show evidence of osteomyelitis in that foot  #1 Right diabetic foot infection sp 1st toe and partial 2nd toe amputation  Cultures have yielded streptococcus intermedius (which is growing in blood cultures) and Group B streptococus  He is on ceftriaxone  We need sensis on the Strep intermedius  #2 lLeft foot ulcer: no osteo  #3 Streptococcus intermedius bacteremia: source is his foot though this organism CAN also cause endocarditis  TTE negative but will consider TEE if he is still here next week  We will likely treat him with 4 weeks of systemic antibiotics regardless  Repeat blood cultures taken today.  #4  Left lower extremity cellulitis:  resolving       LOS: 3 days   [name] [name] 08/11/2021, 3:41 PM

## 2021-08-11 NOTE — Plan of Care (Signed)
  Problem: Health Behavior/Discharge Planning: Goal: Ability to manage health-related needs will improve Outcome: Progressing   Problem: Clinical Measurements: Goal: Diagnostic test results will improve Outcome: Progressing   

## 2021-08-11 NOTE — Plan of Care (Signed)

## 2021-08-11 NOTE — Progress Notes (Signed)
Inpatient Diabetes Program Recommendations  AACE/ADA: New Consensus Statement on Inpatient Glycemic Control (2015)  Target Ranges:  Prepandial:   less than 140 mg/dL      Peak postprandial:   less than 180 mg/dL (1-2 hours)      Critically ill patients:  140 - 180 mg/dL   Lab Results  Component Value Date   GLUCAP 197 (H) 08/11/2021   HGBA1C 6.2 (H) 08/08/2021    Review of Glycemic Control  Diabetes history: DM2 Outpatient Diabetes medications: Humalog 50/50 40 units TID, Ozempic 1 mg weekly Current orders for Inpatient glycemic control: Novolog 0-15 units TID with meals and 0-5 HS + 3 units TID  HgbA1C - 6.2% - may not be accurate with low H/H CBG this am - 197 mg/dL On 9/70 - CBGs from 263-785 mg/dL  Inpatient Diabetes Program Recommendations:    Consider adding Semglee 10 units BID Increase Novolog to 5 units TID if eating > 50% meal  Follow glucose trends.  Thank you. Ailene Ards, RD, LDN, CDE Inpatient Diabetes Coordinator [phone]

## 2021-08-12 ENCOUNTER — Inpatient Hospital Stay: Payer: Self-pay

## 2021-08-12 DIAGNOSIS — L03116 Cellulitis of left lower limb: Secondary | ICD-10-CM | POA: Diagnosis not present

## 2021-08-12 DIAGNOSIS — R7881 Bacteremia: Secondary | ICD-10-CM | POA: Diagnosis not present

## 2021-08-12 DIAGNOSIS — M869 Osteomyelitis, unspecified: Secondary | ICD-10-CM | POA: Diagnosis not present

## 2021-08-12 LAB — CULTURE, BLOOD (ROUTINE X 2)

## 2021-08-12 LAB — CBC WITH DIFFERENTIAL/PLATELET
Abs Immature Granulocytes: 1.69 10*3/uL — ABNORMAL HIGH (ref 0.00–0.07)
Basophils Absolute: 0.1 10*3/uL (ref 0.0–0.1)
Basophils Relative: 1 %
Eosinophils Absolute: 0.2 10*3/uL (ref 0.0–0.5)
Eosinophils Relative: 1 %
HCT: 35.5 % — ABNORMAL LOW (ref 39.0–52.0)
Hemoglobin: 12.3 g/dL — ABNORMAL LOW (ref 13.0–17.0)
Immature Granulocytes: 6 %
Lymphocytes Relative: 8 %
Lymphs Abs: 2.3 10*3/uL (ref 0.7–4.0)
MCH: 29.1 pg (ref 26.0–34.0)
MCHC: 34.6 g/dL (ref 30.0–36.0)
MCV: 84.1 fL (ref 80.0–100.0)
Monocytes Absolute: 2.7 10*3/uL — ABNORMAL HIGH (ref 0.1–1.0)
Monocytes Relative: 9 %
Neutro Abs: 22.1 10*3/uL — ABNORMAL HIGH (ref 1.7–7.7)
Neutrophils Relative %: 75 %
Platelets: 287 10*3/uL (ref 150–400)
RBC: 4.22 MIL/uL (ref 4.22–5.81)
RDW: 13.8 % (ref 11.5–15.5)
WBC: 29.1 10*3/uL — ABNORMAL HIGH (ref 4.0–10.5)
nRBC: 0 % (ref 0.0–0.2)

## 2021-08-12 LAB — GLUCOSE, CAPILLARY
Glucose-Capillary: 209 mg/dL — ABNORMAL HIGH (ref 70–99)
Glucose-Capillary: 260 mg/dL — ABNORMAL HIGH (ref 70–99)
Glucose-Capillary: 258 mg/dL — ABNORMAL HIGH (ref 70–99)
Glucose-Capillary: 219 mg/dL — ABNORMAL HIGH (ref 70–99)

## 2021-08-12 LAB — BASIC METABOLIC PANEL
Anion gap: 9 (ref 5–15)
BUN: 32 mg/dL — ABNORMAL HIGH (ref 8–23)
CO2: 24 mmol/L (ref 22–32)
Calcium: 9.5 mg/dL (ref 8.9–10.3)
Chloride: 94 mmol/L — ABNORMAL LOW (ref 98–111)
Creatinine, Ser: 1.26 mg/dL — ABNORMAL HIGH (ref 0.61–1.24)
GFR, Estimated: >60 mL/min (ref >60)
Glucose, Bld: 221 mg/dL — ABNORMAL HIGH (ref 70–99)
Potassium: 5 mmol/L (ref 3.5–5.1)
Sodium: 127 mmol/L — ABNORMAL LOW (ref 135–145)

## 2021-08-12 LAB — CORTISOL: Cortisol, Plasma: 7.1 ug/dL

## 2021-08-12 MED ORDER — CHLORHEXIDINE GLUCONATE CLOTH 2 % EX PADS
6.0000 | MEDICATED_PAD | Freq: Once | CUTANEOUS | Status: AC
  Administered 2021-08-12: 6 via TOPICAL

## 2021-08-12 MED ORDER — CHLORHEXIDINE GLUCONATE CLOTH 2 % EX PADS
6.0000 | MEDICATED_PAD | Freq: Once | CUTANEOUS | Status: AC
  Administered 2021-08-13: 6 via TOPICAL

## 2021-08-12 MED ORDER — INSULIN GLARGINE-YFGN 100 UNIT/ML ~~LOC~~ SOLN
10.0000 [IU] | Freq: Two times a day (BID) | SUBCUTANEOUS | Status: DC
  Administered 2021-08-12 – 2021-08-13 (×3): 10 [IU] via SUBCUTANEOUS
  Filled 2021-08-12 (×4): qty 0.1

## 2021-08-12 MED ORDER — METRONIDAZOLE 500 MG PO TABS
500.0000 mg | ORAL_TABLET | Freq: Two times a day (BID) | ORAL | Status: DC
  Administered 2021-08-12 – 2021-08-21 (×19): 500 mg via ORAL
  Filled 2021-08-12 (×21): qty 1

## 2021-08-12 MED ORDER — PENICILLIN G POTASSIUM 20000000 UNITS IJ SOLR
9.0000 10*6.[IU] | Freq: Two times a day (BID) | INTRAVENOUS | Status: AC
  Administered 2021-08-13 – 2021-08-18 (×11): 9 10*6.[IU] via INTRAVENOUS
  Filled 2021-08-12 (×12): qty 9

## 2021-08-12 MED ORDER — INSULIN ASPART 100 UNIT/ML IJ SOLN
5.0000 [IU] | Freq: Three times a day (TID) | INTRAMUSCULAR | Status: DC
  Administered 2021-08-12 – 2021-08-13 (×2): 5 [IU] via SUBCUTANEOUS

## 2021-08-12 NOTE — Progress Notes (Signed)
PROGRESS NOTE    [name] [name]  QMG:[id number] DOB: [birth year] DOA: 08/08/2021 PCP: Tally Joe, MD    Chief Complaint  Patient presents with   Wound Infection    Brief Narrative:  65 year old gentleman prior history of hypertension, type 2 diabetes, hyperlipidemia presents to the hospital with left lower extremity cellulitis as well as right great toe nonhealing wound with pain and tenderness.  Patient reports that over the last 1 week prior to admission patient had worsening erythema pain and foul-smelling discharge from the right toe.  He follows up with wound care.  Podiatry and ID were consulted and patient underwent partial first ray amputation of the right foot, partial second toe amputation and debridement of the deep abscess in the right foot by Dr. Loreta Ave on 08/09/2021.  ID on board, appreciate input.   Assessment & Plan:   Active Problems:   Toe infection   Cellulitis of right lower extremity   Osteomyelitis of great toe of right foot (HCC)   Osteomyelitis of second toe of right foot (HCC)   Diabetes mellitus due to underlying condition with diabetic autonomic neuropathy, without long-term current use of insulin (HCC)   Class 3 severe obesity with serious comorbidity and body mass index (BMI) of 45.0 to 49.9 in adult [facility])   Elevated LFTs   Post-operative state   Cellulitis of left lower extremity   Streptococcal bacteremia   Right foot ulcer, MRI on admission of the right foot was concerning for septic arthritis and osteomyelitis of the first proximal and distal phalanges as well as osteomyelitis at the second distal phalanx. It also showed infectious tenosynovitis around the first metatarsal and multiple forefoot abscesses.  Podiatry consulted and he underwent partial first ray amputation, partial second toe amputation and debridement of the deep abscess of the right foot by Dr. Loreta Ave on 08/09/2021.  ID on board and appreciate recommendations. He was started on IV  rocephin, transitioned to IV penicillin and flagyl.  MRI of the left foot does not show any osteomyelitis. Leukocytosis continues to worsen.  Cultures from the right foot shows abundant gram positive cocci in pairs and clusters, possibly streptococcus agalactiae   Streptococcus intermedius bacteremia: from cultures drawn on 08/08/21 Possibly from diabetic foot infection.  Echocardiogram does not show any evidence of valvular vegetations.  Blood cultures are repeated.  Negative so far PICC line to be placed.  ID recommended 4 weeks of IV penicillin and flagyl.    Left lower extremity cellulitis Improving.     Elevated liver enzymes Same patient currently denies any nausea and vomiting, continue to monitor.  Right upper quadrant ultrasound showed cholelithiasis.  Patient is currently asymptomatic.   Stage IIIa CKD Baseline creatinine around 1.5-1.7 over the last 7 years Patient's current creatinine better than baseline.   Type 2 diabetes mellitus with hyperglycemia and with CKD Continue with sliding scale insulin. CBG (last 3)  Recent Labs    08/11/21 2107 08/12/21 0741 08/12/21 1212  GLUCAP 191* 209* 260*   Last hemoglobin A1c around 6.2 Patient is on Ozempic at home which is on hold.  Increase in Semglee 10 units BID ,increase novolog 5 units TIDAC.   Hyperlipidemia Statin on hold due to elevated liver enzymes.   Hyponatremia:  Possibly from IV lasix. Vs Pseudohyponatremia from hyperglycemia.  TSH, cortisol, urine sodium and serum osmo ordered for further eval.  Sodium has improved to 127.  Serum osmo is 288. Cortisol is 7.1, urine sodium is 63, TSH is wnl.  Hypertension:  Well controlled.  Will add hydralazine prn to the amlodipine, avapro and metoprolol   DVT prophylaxis: (Lovenox) Code Status: (Full code) Family Communication: (None at bedside).  Disposition:   Status is: Inpatient  Remains inpatient appropriate because:Ongoing diagnostic testing  needed not appropriate for outpatient work up and IV treatments appropriate due to intensity of illness or inability to take PO  Dispo: The patient is from: Home              Anticipated d/c is to: Home              Patient currently is not medically stable to d/c.   Difficult to place patient No       Consultants:  Podiatry  ID.   Procedures:  Partial 1st ray amputation right foot; partial 2nd toe amputation, I&D deep abscess right foot.  by dr Ardelle Anton on 08/09/21  Antimicrobials:  Antibiotics Given (last 72 hours)     Date/Time Action Medication Dose Rate   08/09/21 2206 New Bag/Given   ceFAZolin (ANCEF) IVPB 2g/100 mL premix 2 g 200 mL/hr   08/10/21 0528 New Bag/Given   ceFAZolin (ANCEF) IVPB 2g/100 mL premix 2 g 200 mL/hr   08/10/21 1033 New Bag/Given   cefTRIAXone (ROCEPHIN) 2 g in sodium chloride 0.9 % 100 mL IVPB 2 g 200 mL/hr   08/11/21 0949 New Bag/Given   cefTRIAXone (ROCEPHIN) 2 g in sodium chloride 0.9 % 100 mL IVPB 2 g 200 mL/hr   08/12/21 0837 New Bag/Given   cefTRIAXone (ROCEPHIN) 2 g in sodium chloride 0.9 % 100 mL IVPB 2 g 200 mL/hr   08/12/21 1444 Given   metroNIDAZOLE (FLAGYL) tablet 500 mg 500 mg          Subjective: No new complaints.   Objective: Vitals:   08/11/21 1346 08/11/21 1936 08/12/21 0618 08/12/21 1408  BP: (!) 141/68 (!) 149/84 (!) 143/84 128/80  Pulse: 68 80 70 67  Resp: 17 18 17 17   Temp: 98.1 F (36.7 C) 98 F (36.7 C) 97.9 F (36.6 C) (!) 97.5 F (36.4 C)  TempSrc: Oral Oral Oral Oral  SpO2: 97% 95% 99% 96%  Weight:      Height:        Intake/Output Summary (Last 24 hours) at 08/12/2021 1608 Last data filed at 08/12/2021 1342 Gross per 24 hour  Intake 700 ml  Output 2200 ml  Net -1500 ml    Filed Weights   08/08/21 1149 08/08/21 1738 08/09/21 1625  Weight: 112.9 kg 118.6 kg 118.6 kg    Examination:  General exam: Appears calm and comfortable  Respiratory system: Clear to auscultation. Respiratory effort  normal. Cardiovascular system: S1 & S2 heard, RRR. No JVD,  Gastrointestinal system: Abdomen is nondistended, soft and nont ender. Normal bowel sounds heard. Central nervous system: Alert and oriented. No focal neurological deficits. Extremities: left leg cellulitis improving. Right foot bandaged.  Skin: No rashes, lesions or ulcers Psychiatry: Mood & affect appropriate.       Data Reviewed: I have personally reviewed following labs and imaging studies  CBC: Recent Labs  Lab 08/08/21 1307 08/08/21 1807 08/09/21 0546 08/10/21 0503 08/11/21 0846 08/12/21 0948  WBC 19.2* 18.0* 20.2* 24.8* 25.2* 29.1*  NEUTROABS 17.0*  --   --   --  20.7* 22.1*  HGB 13.4 12.0* 12.5* 12.1* 12.1* 12.3*  HCT 40.3 35.2* 36.9* 35.6* 35.6* 35.5*  MCV 87.6 85.9 85.2 86.6 84.8 84.1  PLT 295 277 255 271 245  287     Basic Metabolic Panel: Recent Labs  Lab 08/08/21 1307 08/08/21 1807 08/09/21 0546 08/10/21 0503 08/11/21 0846 08/12/21 0459  NA 139  --  135 134* 126* 127*  K 3.9  --  3.5 3.7 4.5 5.0  CL 102  --  99 97* 90* 94*  CO2 24  --  GLUCOSE 153*  --  195* 191* 347* 221*  BUN 35*  --  27* 30* 27* 32*  CREATININE 1.63* 1.51* 1.21 1.25* 1.17 1.26*  CALCIUM 7.6* 6.9* 7.5* 8.3* 8.9 9.5     GFR: Estimated Creatinine Clearance: 79.8 mL/min (A) (by C-G formula based on SCr of 1.26 mg/dL (H)).  Liver Function Tests: Recent Labs  Lab 08/08/21 1307 08/09/21 0546  AST 63* 29  ALT 88* 64*  ALKPHOS 82 81  BILITOT 1.1 1.2  PROT 7.3 6.4*  ALBUMIN 3.2* 2.7*     CBG: Recent Labs  Lab 08/11/21 1130 08/11/21 1606 08/11/21 2107 08/12/21 0741 08/12/21 1212  GLUCAP 320* 279* 191* 209* 260*      Recent Results (from the past 240 hour(s))  Blood Cultures x 2 sites     Status: Abnormal (Preliminary result)   Collection Time: 08/08/21  1:07 PM   Specimen: BLOOD  Result Value Ref Range Status   Specimen Description BLOOD RIGHT ANTECUBITAL  Final   Special Requests    Final    BOTTLES DRAWN AEROBIC AND ANAEROBIC Blood Culture results may not be optimal due to an excessive volume of blood received in culture bottles   Culture  Setup Time   Final    GRAM POSITIVE COCCI IN BOTH AEROBIC AND ANAEROBIC BOTTLES Organism ID to follow CRITICAL RESULT CALLED TO, READ BACK BY AND VERIFIED WITH: Janine Limbo PHARMD [id number] 08/09/21 A BROWNING    Culture STREPTOCOCCUS INTERMEDIUS (A)  Final   Report Status PENDING  Incomplete   Organism ID, Bacteria STREPTOCOCCUS INTERMEDIUS  Final      Susceptibility   Streptococcus intermedius - MIC*    PENICILLIN <=0.06 SENSITIVE Sensitive     CEFTRIAXONE <=0.12 SENSITIVE Sensitive     ERYTHROMYCIN >=8 RESISTANT Resistant     LEVOFLOXACIN <=0.25 SENSITIVE Sensitive     VANCOMYCIN Value in next row Sensitive      0.25 SENSITIVEPerformed at [facility], [address] [place], [place] [address]    * STREPTOCOCCUS INTERMEDIUS  Blood Cultures x 2 sites     Status: Abnormal   Collection Time: 08/08/21  1:07 PM   Specimen: BLOOD  Result Value Ref Range Status   Specimen Description   Final    BLOOD BLOOD RIGHT FOREARM Performed at [facility], [address] [place], [place] [address]    Special Requests   Final    BOTTLES DRAWN AEROBIC AND ANAEROBIC Blood Culture results may not be optimal due to an excessive volume of blood received in culture bottles Performed at [facility], [address] [place], [place] [address]    Culture  Setup Time   Final    GRAM POSITIVE COCCI ANAEROBIC BOTTLE ONLY CRITICAL VALUE NOTED.  VALUE IS CONSISTENT WITH PREVIOUSLY REPORTED AND CALLED VALUE.    Culture (A)  Final    STREPTOCOCCUS INTERMEDIUS SUSCEPTIBILITIES PERFORMED ON PREVIOUS CULTURE WITHIN THE LAST 5 DAYS. Performed at [facility], [address], [place], [place] [address]    Report Status 08/12/2021 FINAL  Final  Blood Culture ID Panel (  Reflexed)     Status: Abnormal    Collection Time: 08/08/21  1:07 PM  Result Value Ref Range Status   Enterococcus faecalis NOT DETECTED NOT DETECTED Final   Enterococcus Faecium NOT DETECTED NOT DETECTED Final   Listeria monocytogenes NOT DETECTED NOT DETECTED Final   Staphylococcus species NOT DETECTED NOT DETECTED Final   Staphylococcus aureus (BCID) NOT DETECTED NOT DETECTED Final   Staphylococcus epidermidis NOT DETECTED NOT DETECTED Final   Staphylococcus lugdunensis NOT DETECTED NOT DETECTED Final   Streptococcus species DETECTED (A) NOT DETECTED Final    Comment: Not Enterococcus species, Streptococcus agalactiae, Streptococcus pyogenes, or Streptococcus pneumoniae. CRITICAL RESULT CALLED TO, READ BACK BY AND VERIFIED WITH: Janine Limbo PHARMD 1914 08/09/21 A BROWNING    Streptococcus agalactiae NOT DETECTED NOT DETECTED Final   Streptococcus pneumoniae NOT DETECTED NOT DETECTED Final   Streptococcus pyogenes NOT DETECTED NOT DETECTED Final   A.calcoaceticus-baumannii NOT DETECTED NOT DETECTED Final   Bacteroides fragilis NOT DETECTED NOT DETECTED Final   Enterobacterales NOT DETECTED NOT DETECTED Final   Enterobacter cloacae complex NOT DETECTED NOT DETECTED Final   Escherichia coli NOT DETECTED NOT DETECTED Final   Klebsiella aerogenes NOT DETECTED NOT DETECTED Final   Klebsiella oxytoca NOT DETECTED NOT DETECTED Final   Klebsiella pneumoniae NOT DETECTED NOT DETECTED Final   Proteus species NOT DETECTED NOT DETECTED Final   Salmonella species NOT DETECTED NOT DETECTED Final   Serratia marcescens NOT DETECTED NOT DETECTED Final   Haemophilus influenzae NOT DETECTED NOT DETECTED Final   Neisseria meningitidis NOT DETECTED NOT DETECTED Final   Pseudomonas aeruginosa NOT DETECTED NOT DETECTED Final   Stenotrophomonas maltophilia NOT DETECTED NOT DETECTED Final   Candida albicans NOT DETECTED NOT DETECTED Final   Candida auris NOT DETECTED NOT DETECTED Final   Candida glabrata NOT DETECTED NOT DETECTED Final    Candida krusei NOT DETECTED NOT DETECTED Final   Candida parapsilosis NOT DETECTED NOT DETECTED Final   Candida tropicalis NOT DETECTED NOT DETECTED Final   Cryptococcus neoformans/gattii NOT DETECTED NOT DETECTED Final    Comment: Performed at [facility], [address] [place], [place] [address]  Resp Panel by RT-PCR (Flu A&B, Covid) Nasopharyngeal Swab     Status: None   Collection Time: 08/08/21  3:00 PM   Specimen: Nasopharyngeal Swab; Nasopharyngeal(NP) swabs in vial transport medium  Result Value Ref Range Status   SARS Coronavirus 2 by RT PCR NEGATIVE NEGATIVE Final    Comment: (NOTE) SARS-CoV-2 target nucleic acids are NOT DETECTED.  The SARS-CoV-2 RNA is generally detectable in upper respiratory specimens during the acute phase of infection. The lowest concentration of SARS-CoV-2 viral copies this assay can detect is 138 copies/mL. A negative result does not preclude SARS-Cov-2 infection and should not be used as the sole basis for treatment or other patient management decisions. A negative result may occur with  improper specimen collection/handling, submission of specimen other than nasopharyngeal swab, presence of viral mutation(s) within the areas targeted by this assay, and inadequate number of viral copies(<138 copies/mL). A negative result must be combined with clinical observations, patient history, and epidemiological information. The expected result is Negative.  Fact Sheet for Patients:  BloggerCourse.com  Fact Sheet for Healthcare Providers:  SeriousBroker.it  This test is no t yet approved or cleared by the [place] FDA and  has been authorized for detection and/or diagnosis of SARS-CoV-2 by FDA under an Emergency Use Authorization (EUA). This EUA will remain  in effect (meaning this  test can be used) for the duration of the COVID-19 declaration under Section 564(b)(1) of the Act,  21 U.S.C.section 360bbb-3(b)(1), unless the authorization is terminated  or revoked sooner.       Influenza A by PCR NEGATIVE NEGATIVE Final   Influenza B by PCR NEGATIVE NEGATIVE Final    Comment: (NOTE) The Xpert Xpress SARS-CoV-2/FLU/RSV plus assay is intended as an aid in the diagnosis of influenza from Nasopharyngeal swab specimens and should not be used as a sole basis for treatment. Nasal washings and aspirates are unacceptable for Xpert Xpress SARS-CoV-2/FLU/RSV testing.  Fact Sheet for Patients: BloggerCourse.com  Fact Sheet for Healthcare Providers: SeriousBroker.it  This test is not yet approved or cleared by the [place] FDA and has been authorized for detection and/or diagnosis of SARS-CoV-2 by FDA under an Emergency Use Authorization (EUA). This EUA will remain in effect (meaning this test can be used) for the duration of the COVID-19 declaration under Section 564(b)(1) of the Act, 21 U.S.C. section 360bbb-3(b)(1), unless the authorization is terminated or revoked.  Performed at [facility], [address] [place], [place] [address]   Aerobic/Anaerobic Culture w Gram Stain (surgical/deep wound)     Status: None (Preliminary result)   Collection Time: 08/09/21  6:18 PM   Specimen: Abscess  Result Value Ref Range Status   Specimen Description   Final    ABSCESS RIGHT BIG TOE Performed at [facility], [address] [place], [place] [address]    Special Requests   Final    NONE Performed at [facility], [address], [place], [place] [address]    Gram Stain   Final    RARE WBC PRESENT, PREDOMINANTLY MONONUCLEAR ABUNDANT GRAM POSITIVE COCCI IN PAIRS ABUNDANT GRAM POSITIVE COCCI IN CLUSTERS    Culture   Final    FEW STREPTOCOCCUS AGALACTIAE TESTING AGAINST S. AGALACTIAE NOT ROUTINELY PERFORMED DUE TO PREDICTABILITY OF AMP/PEN/VAN  SUSCEPTIBILITY. MODERATE STREPTOCOCCUS INTERMEDIUS SUSCEPTIBILITIES TO FOLLOW FEW PREVOTELLA BIVIA BETA LACTAMASE POSITIVE Performed at [facility], [address], [place], [place] [address]    Report Status PENDING  Incomplete          Radiology Studies: ECHOCARDIOGRAM COMPLETE  Result Date: 08/11/2021    ECHOCARDIOGRAM REPORT   Patient Name:   [name] [name] Date of Exam: 08/10/2021 Medical Rec #:  [id number]      Height:       72.0 in Accession #:    [id number]     Weight:       261.5 lb Date of Birth:  02-Aug-1956      BSA:          2.388 m Patient Age:    65 years       BP:           152/91 mmHg Patient Gender: M              HR:           75 bpm. Exam Location:  Inpatient Procedure: 2D Echo, Cardiac Doppler and Color Doppler Indications:    bacteremia  History:        Patient has no prior history of Echocardiogram examinations.                 CAD; Risk Factors:Diabetes, Hypertension and Dyslipidemia.  Sonographer:    Celesta Gentile RCS Referring Phys: [phone] CORNELIUS N VAN DAM IMPRESSIONS  1. Left ventricular ejection fraction, by estimation, is  55 to 60%. The left ventricle has normal function. The left ventricle has no regional wall motion abnormalities. There is mild left ventricular hypertrophy. Left ventricular diastolic parameters are consistent with Grade I diastolic dysfunction (impaired relaxation).  2. Right ventricular systolic function is normal. The right ventricular size is normal. Tricuspid regurgitation signal is inadequate for assessing PA pressure.  3. The mitral valve is grossly normal. Trivial mitral valve regurgitation. No evidence of mitral stenosis.  4. The aortic valve is tricuspid. Aortic valve regurgitation is not visualized. No aortic stenosis is present.  5. The inferior vena cava is normal in size with greater than 50% respiratory variability, suggesting right atrial pressure of 3 mmHg. Conclusion(s)/Recommendation(s): No evidence of valvular vegetations on  this transthoracic echocardiogram. Consider a transesophageal echocardiogram to exclude infective endocarditis if clinically indicated. FINDINGS  Left Ventricle: Left ventricular ejection fraction, by estimation, is 55 to 60%. The left ventricle has normal function. The left ventricle has no regional wall motion abnormalities. The left ventricular internal cavity size was normal in size. There is  mild left ventricular hypertrophy. Left ventricular diastolic parameters are consistent with Grade I diastolic dysfunction (impaired relaxation). Right Ventricle: The right ventricular size is normal. No increase in right ventricular wall thickness. Right ventricular systolic function is normal. Tricuspid regurgitation signal is inadequate for assessing PA pressure. Left Atrium: Left atrial size was normal in size. Right Atrium: Right atrial size was normal in size. Pericardium: There is no evidence of pericardial effusion. Mitral Valve: The mitral valve is grossly normal. Trivial mitral valve regurgitation. No evidence of mitral valve stenosis. Tricuspid Valve: The tricuspid valve is normal in structure. Tricuspid valve regurgitation is trivial. No evidence of tricuspid stenosis. Aortic Valve: The aortic valve is tricuspid. Aortic valve regurgitation is not visualized. No aortic stenosis is present. Pulmonic Valve: The pulmonic valve was normal in structure. Pulmonic valve regurgitation is trivial. No evidence of pulmonic stenosis. Aorta: The aortic root is normal in size and structure. Venous: The inferior vena cava is normal in size with greater than 50% respiratory variability, suggesting right atrial pressure of 3 mmHg. IAS/Shunts: The interatrial septum appears to be lipomatous. No atrial level shunt detected by color flow Doppler.  LEFT VENTRICLE PLAX 2D LVIDd:         5.40 cm  Diastology LVIDs:         3.50 cm  LV e' medial:    5.00 cm/s LV PW:         1.20 cm  LV E/e' medial:  13.6 LV IVS:        1.30 cm  LV e'  lateral:   6.74 cm/s LVOT diam:     1.80 cm  LV E/e' lateral: 10.1 LV SV:         58 LV SV Index:   24 LVOT Area:     2.54 cm  RIGHT VENTRICLE RV S prime:     12.80 cm/s TAPSE (M-mode): 1.5 cm LEFT ATRIUM           Index       RIGHT ATRIUM           Index LA diam:      4.00 cm 1.68 cm/m  RA Area:     16.80 cm LA Vol (A4C): 48.4 ml 20.27 ml/m RA Volume:   50.10 ml  20.98 ml/m  AORTIC VALVE LVOT Vmax:   127.00 cm/s LVOT Vmean:  91.500 cm/s LVOT VTI:    0.227 m  AORTA Ao  Root diam: 3.70 cm MITRAL VALVE MV Area (PHT): 2.93 cm    SHUNTS MV Decel Time: 259 msec    Systemic VTI:  0.23 m MV E velocity: 68.20 cm/s  Systemic Diam: 1.80 cm MV A velocity: 72.50 cm/s MV E/A ratio:  0.94 Weston Brass MD Electronically signed by Weston Brass MD Signature Date/Time: 08/11/2021/4:40:14 AM    Final         Scheduled Meds:  amLODipine  10 mg Oral QHS   aspirin  325 mg Oral Daily   buPROPion  300 mg Oral Daily   calcium carbonate  400 mg of elemental calcium Oral BID   enoxaparin (LOVENOX) injection  60 mg Subcutaneous Q24H   insulin aspart  0-15 Units Subcutaneous TID WC   insulin aspart  0-5 Units Subcutaneous QHS   insulin aspart  5 Units Subcutaneous TID WC   insulin glargine-yfgn  10 Units Subcutaneous BID   irbesartan  300 mg Oral Daily   metoprolol tartrate  100 mg Oral BID   metroNIDAZOLE  500 mg Oral Q12H   omega-3 acid ethyl esters  1 g Oral Daily   pantoprazole  40 mg Oral Daily   senna-docusate  2 tablet Oral BID   Vitamin D (Ergocalciferol)  50,000 Units Oral Q7 days   Continuous Infusions:  [START ON 08/13/2021] penicillin g continuous IV infusion       LOS: 4 days        Kathlen Mody, MD [facility]   To contact the attending provider between 7A-7P or the covering provider during after hours 7P-7A, please log into the web site [email or web address] and access using universal [place] password for that web site. If you do not have the password, please call the hospital  operator.  08/12/2021, 4:08 PM

## 2021-08-12 NOTE — Progress Notes (Signed)
Arrived at patient bedside to place PICC.  Explained that we were there for that purpose, he stated "Not tonight"  Patient's nurse at bedside and aware.  Patient for surgery in the morning.  Has adequate access and is aware that we will plan to place tomorrow at some point after surgery.  Patient denies any questions related to PICC insertion.

## 2021-08-12 NOTE — Progress Notes (Signed)
Subjective: POD # 3 s/p right partial first amputation, partial second toe amputation.  He has no new concerns.  Will need PICC line.  No fevers or chills.   Objective: AAO x3, NAD DP/PT pulses palpable bilaterally, CRT less than 3 seconds Changed the dressing today.  Status post partial partial potation as well as partial second digit amputation.  Sutures are intact.  Upon expression of the wound only bloody drainage was expressed.  There may been a faint amount of purulence distally but minimally.  There is mild edema to the foot with mild erythema directly on the incision.  No ascending cellulitis.  There is no malodor. Ulceration the plantar aspect left hallux with granular base without any probing.  Mild surrounding erythema.  The erythema up to the leg has improved.  No open sores to the leg. No pain with calf compression, swelling, warmth, erythema       Assessment: POD #3 s/p right partial first ray, partial second amputation  Plan: White blood cell count increased today as well.  At this point I discussed the return to the operating room for debridement, washout of the wound.  We discussed the surgery as well as postoperative course.  His wife and family members were at bedside and as well as on speaker phone.  Everyone is in agreement to go and proceed with return to the operating room tomorrow morning.  He is scheduled for 730.  Plantar right foot incision and drainage, debridement of the wound.  Alternatives risks and complications were discussed with the patient/family.  N.p.o. after midnight.  Ovid Curd, DPM

## 2021-08-12 NOTE — Progress Notes (Signed)
Subjective:  No new complaints  Antibiotics:  Anti-infectives (From admission, onward)    Start     Dose/Rate Route Frequency Ordered Stop   08/10/21 1000  cefTRIAXone (ROCEPHIN) 2 g in sodium chloride 0.9 % 100 mL IVPB        2 g 200 mL/hr over 30 Minutes Intravenous Every 24 hours 08/10/21 0905     08/10/21 0200  ceFAZolin (ANCEF) IVPB 1 g/50 mL premix  Status:  Discontinued        1 g 100 mL/hr over 30 Minutes Intravenous Every 8 hours 08/09/21 1912 08/09/21 1913   08/09/21 2000  ceFAZolin (ANCEF) IVPB 2g/100 mL premix  Status:  Discontinued        2 g 200 mL/hr over 30 Minutes Intravenous Every 8 hours 08/09/21 1811 08/10/21 0905   08/09/21 1758  ceFAZolin (ANCEF) 2-4 GM/100ML-% IVPB       Note to Pharmacy: Georgena Spurling   : cabinet override      08/09/21 1758 08/10/21 0559       Medications: Scheduled Meds:  amLODipine  10 mg Oral QHS   aspirin  325 mg Oral Daily   buPROPion  300 mg Oral Daily   calcium carbonate  400 mg of elemental calcium Oral BID   enoxaparin (LOVENOX) injection  60 mg Subcutaneous Q24H   insulin aspart  0-15 Units Subcutaneous TID WC   insulin aspart  0-5 Units Subcutaneous QHS   insulin aspart  3 Units Subcutaneous TID WC   insulin glargine-yfgn  10 Units Subcutaneous Daily   irbesartan  300 mg Oral Daily   metoprolol tartrate  100 mg Oral BID   omega-3 acid ethyl esters  1 g Oral Daily   pantoprazole  40 mg Oral Daily   senna-docusate  2 tablet Oral BID   Vitamin D (Ergocalciferol)  50,000 Units Oral Q7 days   Continuous Infusions:  cefTRIAXone (ROCEPHIN)  IV 2 g (08/12/21 0837)   PRN Meds:.ALPRAZolam, oxyCODONE, polyethylene glycol, traMADol    Objective: Weight change:   Intake/Output Summary (Last 24 hours) at 08/12/2021 1353 Last data filed at 08/12/2021 1342 Gross per 24 hour  Intake 700 ml  Output 2200 ml  Net -1500 ml    Blood pressure (!) 143/84, pulse 70, temperature 97.9 F (36.6 C), temperature source  Oral, resp. rate 17, height 6' (1.829 m), weight 118.6 kg, SpO2 99 %. Temp:  [97.9 F (36.6 C)-98 F (36.7 C)] 97.9 F (36.6 C) (09/23 0618) Pulse Rate:  [70-80] 70 (09/23 0618) Resp:  [17-18] 17 (09/23 0618) BP: (143-149)/(84) 143/84 (09/23 0618) SpO2:  [95 %-99 %] 99 % (09/23 0618)  Physical Exam: Physical Exam Constitutional:      Appearance: He is well-developed.  HENT:     Head: Normocephalic and atraumatic.  Eyes:     Conjunctiva/sclera: Conjunctivae normal.  Cardiovascular:     Rate and Rhythm: Normal rate and regular rhythm.  Pulmonary:     Effort: Pulmonary effort is normal. No respiratory distress.     Breath sounds: No stridor. No wheezing.  Abdominal:     General: There is no distension.     Palpations: Abdomen is soft.  Musculoskeletal:        General: Normal range of motion.     Cervical back: Normal range of motion and neck supple.  Skin:    General: Skin is warm and dry.     Findings: Erythema present. No rash.  Neurological:  General: No focal deficit present.     Mental Status: He is alert and oriented to person, place, and time.  Psychiatric:        Mood and Affect: Mood normal.        Behavior: Behavior normal.        Thought Content: Thought content normal.        Judgment: Judgment normal.    Left lower extremity 08/08/2021       Left lower extremity 08/09/2021    Left lower extremity today August 10, 2021:    LLE 08/11/2021:     Toes bandaged    CBC:    BMET Recent Labs    08/11/21 0846 08/12/21 0459  NA 126* 127*  K 4.5 5.0  CL 90* 94*  CO2 25 24  GLUCOSE 347* 221*  BUN 27* 32*  CREATININE 1.17 1.26*  CALCIUM 8.9 9.5      Liver Panel  No results for input(s): PROT, ALBUMIN, AST, ALT, ALKPHOS, BILITOT, BILIDIR, IBILI in the last 72 hours.     Sedimentation Rate No results for input(s): ESRSEDRATE in the last 72 hours. C-Reactive Protein No results for input(s): CRP in the last 72 hours.  Micro  Results: Recent Results (from the past 720 hour(s))  Blood Cultures x 2 sites     Status: Abnormal (Preliminary result)   Collection Time: 08/08/21  1:07 PM   Specimen: BLOOD  Result Value Ref Range Status   Specimen Description BLOOD RIGHT ANTECUBITAL  Final   Special Requests   Final    BOTTLES DRAWN AEROBIC AND ANAEROBIC Blood Culture results may not be optimal due to an excessive volume of blood received in culture bottles   Culture  Setup Time   Final    GRAM POSITIVE COCCI IN BOTH AEROBIC AND ANAEROBIC BOTTLES Organism ID to follow CRITICAL RESULT CALLED TO, READ BACK BY AND VERIFIED WITH: Shelda Jakes PHARMD [id number] 08/09/21 A BROWNING    Culture STREPTOCOCCUS INTERMEDIUS (A)  Final   Report Status PENDING  Incomplete   Organism ID, Bacteria STREPTOCOCCUS INTERMEDIUS  Final      Susceptibility   Streptococcus intermedius - MIC*    PENICILLIN <=0.06 SENSITIVE Sensitive     CEFTRIAXONE <=0.12 SENSITIVE Sensitive     ERYTHROMYCIN >=8 RESISTANT Resistant     LEVOFLOXACIN <=0.25 SENSITIVE Sensitive     VANCOMYCIN Value in next row Sensitive      0.25 SENSITIVEPerformed at [facility] [address], [place], [place] [address]    * STREPTOCOCCUS INTERMEDIUS  Blood Cultures x 2 sites     Status: Abnormal   Collection Time: 08/08/21  1:07 PM   Specimen: BLOOD  Result Value Ref Range Status   Specimen Description   Final    BLOOD BLOOD RIGHT FOREARM Performed at [facility] [address], [place], [place] [address]    Special Requests   Final    BOTTLES DRAWN AEROBIC AND ANAEROBIC Blood Culture results may not be optimal due to an excessive volume of blood received in culture bottles Performed at [facility] [address] [place], [place] [address]    Culture  Setup Time   Final    GRAM POSITIVE COCCI ANAEROBIC BOTTLE ONLY CRITICAL VALUE NOTED.  VALUE IS CONSISTENT WITH PREVIOUSLY REPORTED AND CALLED VALUE.    Culture (A)  Final     STREPTOCOCCUS INTERMEDIUS SUSCEPTIBILITIES PERFORMED ON PREVIOUS CULTURE WITHIN THE LAST 5 DAYS. Performed at [facility], [place] [address] [place], [place] [address]    Report Status 08/12/2021 FINAL  Final  Blood Culture ID Panel (Reflexed)     Status: Abnormal   Collection Time: 08/08/21  1:07 PM  Result Value Ref Range Status   Enterococcus faecalis NOT DETECTED NOT DETECTED Final   Enterococcus Faecium NOT DETECTED NOT DETECTED Final   Listeria monocytogenes NOT DETECTED NOT DETECTED Final   Staphylococcus species NOT DETECTED NOT DETECTED Final   Staphylococcus aureus (BCID) NOT DETECTED NOT DETECTED Final   Staphylococcus epidermidis NOT DETECTED NOT DETECTED Final   Staphylococcus lugdunensis NOT DETECTED NOT DETECTED Final   Streptococcus species DETECTED (A) NOT DETECTED Final    Comment: Not Enterococcus species, Streptococcus agalactiae, Streptococcus pyogenes, or Streptococcus pneumoniae. CRITICAL RESULT CALLED TO, READ BACK BY AND VERIFIED WITH: Shelda Jakes [id number] 08/09/21 A BROWNING    Streptococcus agalactiae NOT DETECTED NOT DETECTED Final   Streptococcus pneumoniae NOT DETECTED NOT DETECTED Final   Streptococcus pyogenes NOT DETECTED NOT DETECTED Final   A.calcoaceticus-baumannii NOT DETECTED NOT DETECTED Final   Bacteroides fragilis NOT DETECTED NOT DETECTED Final   Enterobacterales NOT DETECTED NOT DETECTED Final   Enterobacter cloacae complex NOT DETECTED NOT DETECTED Final   Escherichia coli NOT DETECTED NOT DETECTED Final   Klebsiella aerogenes NOT DETECTED NOT DETECTED Final   Klebsiella oxytoca NOT DETECTED NOT DETECTED Final   Klebsiella pneumoniae NOT DETECTED NOT DETECTED Final   Proteus species NOT DETECTED NOT DETECTED Final   Salmonella species NOT DETECTED NOT DETECTED Final   Serratia marcescens NOT DETECTED NOT DETECTED Final   Haemophilus influenzae NOT DETECTED NOT DETECTED Final   Neisseria meningitidis NOT DETECTED NOT DETECTED Final    Pseudomonas aeruginosa NOT DETECTED NOT DETECTED Final   Stenotrophomonas maltophilia NOT DETECTED NOT DETECTED Final   Candida albicans NOT DETECTED NOT DETECTED Final   Candida auris NOT DETECTED NOT DETECTED Final   Candida glabrata NOT DETECTED NOT DETECTED Final   Candida krusei NOT DETECTED NOT DETECTED Final   Candida parapsilosis NOT DETECTED NOT DETECTED Final   Candida tropicalis NOT DETECTED NOT DETECTED Final   Cryptococcus neoformans/gattii NOT DETECTED NOT DETECTED Final    Comment: Performed at [facility], [address], [place], [place] [address]  Resp Panel by RT-PCR (Flu A&B, Covid) Nasopharyngeal Swab     Status: None   Collection Time: 08/08/21  3:00 PM   Specimen: Nasopharyngeal Swab; Nasopharyngeal(NP) swabs in vial transport medium  Result Value Ref Range Status   SARS Coronavirus 2 by RT PCR NEGATIVE NEGATIVE Final    Comment: (NOTE) SARS-CoV-2 target nucleic acids are NOT DETECTED.  The SARS-CoV-2 RNA is generally detectable in upper respiratory specimens during the acute phase of infection. The lowest concentration of SARS-CoV-2 viral copies this assay can detect is 138 copies/mL. A negative result does not preclude SARS-Cov-2 infection and should not be used as the sole basis for treatment or other patient management decisions. A negative result may occur with  improper specimen collection/handling, submission of specimen other than nasopharyngeal swab, presence of viral mutation(s) within the areas targeted by this assay, and inadequate number of viral copies(<138 copies/mL). A negative result must be combined with clinical observations, patient history, and epidemiological information. The expected result is Negative.  Fact Sheet for Patients:  EntrepreneurPulse.com.au  Fact Sheet for Healthcare Providers:  IncredibleEmployment.be  This test is no t yet approved or cleared by the [place] and   has been authorized  for detection and/or diagnosis of SARS-CoV-2 by FDA under an Emergency Use Authorization (EUA). This EUA will remain  in effect (meaning this test can be used) for the duration of the COVID-19 declaration under Section 564(b)(1) of the Act, 21 U.S.C.section 360bbb-3(b)(1), unless the authorization is terminated  or revoked sooner.       Influenza A by PCR NEGATIVE NEGATIVE Final   Influenza B by PCR NEGATIVE NEGATIVE Final    Comment: (NOTE) The Xpert Xpress SARS-CoV-2/FLU/RSV plus assay is intended as an aid in the diagnosis of influenza from Nasopharyngeal swab specimens and should not be used as a sole basis for treatment. Nasal washings and aspirates are unacceptable for Xpert Xpress SARS-CoV-2/FLU/RSV testing.  Fact Sheet for Patients: EntrepreneurPulse.com.au  Fact Sheet for Healthcare Providers: IncredibleEmployment.be  This test is not yet approved or cleared by the [place] FDA and has been authorized for detection and/or diagnosis of SARS-CoV-2 by FDA under an Emergency Use Authorization (EUA). This EUA will remain in effect (meaning this test can be used) for the duration of the COVID-19 declaration under Section 564(b)(1) of the Act, 21 U.S.C. section 360bbb-3(b)(1), unless the authorization is terminated or revoked.  Performed at [facility], [place] [address], [place], [place] [address]   Aerobic/Anaerobic Culture w Gram Stain (surgical/deep wound)     Status: None (Preliminary result)   Collection Time: 08/09/21  6:18 PM   Specimen: Abscess  Result Value Ref Range Status   Specimen Description   Final    ABSCESS RIGHT BIG TOE Performed at [facility] [address] [place], [place] [address]    Special Requests   Final    NONE Performed at [facility], [place] [address] [place], [place] [address]    Gram Stain   Final    RARE WBC  PRESENT, PREDOMINANTLY MONONUCLEAR ABUNDANT GRAM POSITIVE COCCI IN PAIRS ABUNDANT GRAM POSITIVE COCCI IN CLUSTERS    Culture   Final    FEW STREPTOCOCCUS AGALACTIAE TESTING AGAINST S. AGALACTIAE NOT ROUTINELY PERFORMED DUE TO PREDICTABILITY OF AMP/PEN/VAN SUSCEPTIBILITY. MODERATE STREPTOCOCCUS INTERMEDIUS SUSCEPTIBILITIES TO FOLLOW FEW PREVOTELLA BIVIA BETA LACTAMASE POSITIVE Performed at [facility], [place] [address] [place], [place] [address]    Report Status PENDING  Incomplete    Studies/Results: ECHOCARDIOGRAM COMPLETE  Result Date: 08/11/2021    ECHOCARDIOGRAM REPORT   Patient Name:   JESS TONEY Date of Exam: 08/10/2021 Medical Rec #:  [id number]      Height:       72.0 in Accession #:    [id number]     Weight:       261.5 lb Date of Birth:  December 24, 1955      BSA:          2.388 m Patient Age:    65 years       BP:           152/91 mmHg Patient Gender: M              HR:           75 bpm. Exam Location:  Inpatient Procedure: 2D Echo, Cardiac Doppler and Color Doppler Indications:    bacteremia  History:        Patient has no prior history of Echocardiogram examinations.                 CAD; Risk Factors:Diabetes, Hypertension and Dyslipidemia.  Sonographer:    Alvino Chapel RCS Referring Phys:  [place]  1. Left ventricular ejection fraction, by estimation, is 55 to 60%. The left ventricle has normal function. The left ventricle has no regional wall motion abnormalities. There is mild left ventricular hypertrophy. Left ventricular diastolic parameters are consistent with Grade I diastolic dysfunction (impaired relaxation).  2. Right ventricular systolic function is normal. The right ventricular size is normal. Tricuspid regurgitation signal is inadequate for assessing PA pressure.  3. The mitral valve is grossly normal. Trivial mitral valve regurgitation. No evidence of mitral stenosis.  4. The aortic valve is tricuspid. Aortic valve regurgitation is not  visualized. No aortic stenosis is present.  5. The inferior vena cava is normal in size with greater than 50% respiratory variability, suggesting right atrial pressure of 3 mmHg. Conclusion(s)/Recommendation(s): No evidence of valvular vegetations on this transthoracic echocardiogram. Consider a transesophageal echocardiogram to exclude infective endocarditis if clinically indicated. FINDINGS  Left Ventricle: Left ventricular ejection fraction, by estimation, is 55 to 60%. The left ventricle has normal function. The left ventricle has no regional wall motion abnormalities. The left ventricular internal cavity size was normal in size. There is  mild left ventricular hypertrophy. Left ventricular diastolic parameters are consistent with Grade I diastolic dysfunction (impaired relaxation). Right Ventricle: The right ventricular size is normal. No increase in right ventricular wall thickness. Right ventricular systolic function is normal. Tricuspid regurgitation signal is inadequate for assessing PA pressure. Left Atrium: Left atrial size was normal in size. Right Atrium: Right atrial size was normal in size. Pericardium: There is no evidence of pericardial effusion. Mitral Valve: The mitral valve is grossly normal. Trivial mitral valve regurgitation. No evidence of mitral valve stenosis. Tricuspid Valve: The tricuspid valve is normal in structure. Tricuspid valve regurgitation is trivial. No evidence of tricuspid stenosis. Aortic Valve: The aortic valve is tricuspid. Aortic valve regurgitation is not visualized. No aortic stenosis is present. Pulmonic Valve: The pulmonic valve was normal in structure. Pulmonic valve regurgitation is trivial. No evidence of pulmonic stenosis. Aorta: The aortic root is normal in size and structure. Venous: The inferior vena cava is normal in size with greater than 50% respiratory variability, suggesting right atrial pressure of 3 mmHg. IAS/Shunts: The interatrial septum appears to be  lipomatous. No atrial level shunt detected by color flow Doppler.  LEFT VENTRICLE PLAX 2D LVIDd:         5.40 cm  Diastology LVIDs:         3.50 cm  LV e' medial:    5.00 cm/s LV PW:         1.20 cm  LV E/e' medial:  13.6 LV IVS:        1.30 cm  LV e' lateral:   6.74 cm/s LVOT diam:     1.80 cm  LV E/e' lateral: 10.1 LV SV:         58 LV SV Index:   24 LVOT Area:     2.54 cm  RIGHT VENTRICLE RV S prime:     12.80 cm/s TAPSE (M-mode): 1.5 cm LEFT ATRIUM           Index       RIGHT ATRIUM           Index LA diam:      4.00 cm 1.68 cm/m  RA Area:     16.80 cm LA Vol (A4C): 48.4 ml 20.27 ml/m RA Volume:   50.10 ml  20.98 ml/m  AORTIC VALVE LVOT Vmax:   127.00 cm/s  LVOT Vmean:  91.500 cm/s LVOT VTI:    0.227 m  AORTA Ao Root diam: 3.70 cm MITRAL VALVE MV Area (PHT): 2.93 cm    SHUNTS MV Decel Time: 259 msec    Systemic VTI:  0.23 m MV E velocity: 68.20 cm/s  Systemic Diam: 1.80 cm MV A velocity: 72.50 cm/s MV E/A ratio:  0.94 Cherlynn Kaiser MD Electronically signed by Cherlynn Kaiser MD Signature Date/Time: 08/11/2021/4:40:14 AM    Final       Assessment/Plan:  INTERVAL HISTORY: Septic coccus intermedius is sensitive to penicillin   Active Problems:   Toe infection   Cellulitis of right lower extremity   Osteomyelitis of great toe of right foot (HCC)   Osteomyelitis of second toe of right foot (HCC)   Diabetes mellitus due to underlying condition with diabetic autonomic neuropathy, without long-term current use of insulin (HCC)   Class 3 severe obesity with serious comorbidity and body mass index (BMI) of 45.0 to 49.9 in adult [facility])   Elevated LFTs   Post-operative state   Cellulitis of left lower extremity   Streptococcal bacteremia    DANIAL HLAVAC is a 65 y.o. male with diabetes mellitus obesity, coronary artery disease who has been struggling with bilateral diabetic foot ulcers now admitted with acute worsening of drainage from his right great toe also with onset of erythema consistent  with cellulitis in his left lower extremity.  Plain films of shown osteomyelitis in the still phalanx of the great toe and second toe on the right.  RI shows the ulceration that is obvious on exam with communication of the joint space and first IP joint with septic arthritis and osteomyelitis of first proximal and distal phalanges with pathological fracture of the base of the first distal phalanx with ulceration of the tip of the second toe with underlying osteomyelitis the second distal phalanx tear of the flexor pollicis longus and infectious tenosynovitis with multiple forefoot abscess  He also has an ulcer in his left great toe as well  Fortunately MRI did not show evidence of osteomyelitis in that foot  #1  Right great toe and second toe osteomyelitis status post amputation of first toe and partial amputation of second toe with Streptococcus intermedius and group B strep isolated on culture from the OR Beta lactamase + prevotella along with Streptococcus intermedius in the blood as well see below  We will switch to PCN tomorrow and add flagyl  Dr. Earleen Newport is monitoring him closely and may end up taking him back to the operating room for further debridement.  We will plan on a 4-week course IV penicillin  and flagyl po BID  #2  To coccus intermedius bacteremia:  TTE personally reviewed and no evidence of endocarditis.  We will plan on giving him 4 weeks of systemic antibiotics which be some sufficient to treat endocarditis if he had that.  Repeat blood cultures taken yesterday  OK to place PICC line at this point  #4  Left lower extremity cellulitis: resolving   Diagnosis: Osteomyelitis and bacteremia  Culture Result: Streptococcus intermedius group B streptococcus and Prevotella  No Known Allergies  OPAT Orders Discharge antibiotics:  Penicillin via continuous infusion  Oral metronidazole 500 twice daily  Duration:  4 weeks  End Date:  09/08/2021  [facility] Care Per  Protocol:   Labs  weekly while on IV antibiotics: _x_ CBC with differential _x_ BMP w GFR/CMP _x_ CRP __x ESR   x__ Please pull PIC at completion of  IV antibiotics __ Please leave PIC in place until doctor has seen patient or been notified  Fax weekly labs to [phone]  Clinic Follow Up Appt:   Marygrace Drought has an appointment on 09/02/2021 at 845 AM with Dr. Tommy Medal  The [facility] is located in the [facility] at  [place] in [place].  [address], which is located to the left of the elevators.  Phone: [phone]  Fax: [phone]  [email or web address]  He arrive 15 to 30 minutes prior to the appointment.  Dr Gale Journey available for questions this weekend and will take over the service on Monday.   LOS: 4 days   Alcide Evener 08/12/2021, 1:53 PM

## 2021-08-12 NOTE — Progress Notes (Signed)
Subjective: POD # 2 s/p right partial first amputation, partial second toe amputation.  He states he is feeling well and not having significant pain.  No fevers or chills.  No new concerns.  Objective: AAO x3, NAD DP/PT pulses palpable bilaterally, CRT less than 3 seconds Changed the dressing today.  Status post partial partial potation as well as partial second digit amputation.  Sutures are intact today.  Small amount of bloody drainage was expressed but there is no purulence.  There is mild edema.  There is no significant cellulitis but there is slight warmth.  There is no fluctuance or crepitation.  There is no malodor. Ulceration the plantar aspect left hallux with granular base without any probing.  Mild surrounding erythema.  The erythema up to the leg has improved.  No open sores to the leg. No pain with calf compression, swelling, warmth, erythema  Assessment: POD #2 s/p right partial first ray, partial second amputation  Plan: Dressing was changed today.  The wound on the right foot appears to be doing well and there is no purulence identified.  We will keep a close eye on the foot as well as blood work.  If no improvement in WBC discussed possibly returning to surgery for further debridement, washout of the wound.  We will continue monitor clinical improvement.  Continue elevation for now.  I have him on bedrest as he has left knee pain and not able to move the left knee much and also with his recent right foot surgery I do want him to fall.  Once he continues to improve we will likely transition to weightbearing to heel in [place] wedge shoe and have physical therapy work with him.  Continue IV antibiotics per infectious disease.  Podiatry will continue to follow  Ovid Curd, DPM

## 2021-08-12 NOTE — Progress Notes (Signed)
Pt said he does not wear cpap and refused it for tonight. No distress noted .

## 2021-08-12 NOTE — Progress Notes (Signed)
Obtained consent for PICC line placement with family at bedside.

## 2021-08-12 NOTE — Progress Notes (Signed)
Patient refused to wear CPAP last eveing.

## 2021-08-12 NOTE — TOC Initial Note (Signed)
Transition of Care [facility]) - Initial/Assessment Note    Patient Details  Name: [name] [name] MRN: [id number] Date of Birth: 1956-04-11  Transition of Care [facility]) CM/SW Contact:    Emari Demmer, Meriam Sprague, RN Phone Number: 08/12/2021, 2:09 PM  Clinical Narrative:                 TOC contacted by liaison from [employer] that she had heard from ID that pt would need to go home on IV abx. [facility] to follow along for orders and timing of DC. [facility] to provide home health RN. Teaching to be done with pt wife today. Pt will need PICC line and OPAT orders for dc. TOC will continue to follow.  Expected Discharge Plan: Home w Home Health Services Barriers to Discharge: Continued Medical Work up   Expected Discharge Plan and Services Expected Discharge Plan: Home w Home Health Services   Discharge Planning Services: CM Consult   Living arrangements for the past 2 months: Single Family Home                    HH Arranged: RN, IV Antibiotics HH Agency: [employer] ([facility]) Date HH Agency Contacted: 08/11/21   Representative spoke with at [facility] Agency: Pam  Prior Living Arrangements/Services Living arrangements for the past 2 months: Single Family Home Lives with:: Spouse          Need for Family Participation in Patient Care: Yes (Comment) Care giver support system in place?: Yes (comment)   Criminal Activity/Legal Involvement Pertinent to Current Situation/Hospitalization: No - Comment as needed  Activities of Daily Living Home Assistive Devices/Equipment: Cane (specify quad or straight), Eyeglasses, CBG Meter (single point cane) ADL Screening (condition at time of admission) Patient's cognitive ability adequate to safely complete daily activities?: Yes Is the patient deaf or have difficulty hearing?: No Does the patient have difficulty seeing, even when wearing glasses/contacts?: No Does the patient have difficulty concentrating, remembering, or making decisions?: No Patient able to  express need for assistance with ADLs?: Yes Does the patient have difficulty dressing or bathing?: Yes Independently performs ADLs?: No (secondary to left knee weakness and pain) Communication: Independent Dressing (OT): Needs assistance Is this a change from baseline?: Change from baseline, expected to last >3 days Grooming: Independent Feeding: Independent Bathing: Needs assistance Is this a change from baseline?: Change from baseline, expected to last >3 days Toileting: Dependent Is this a change from baseline?: Change from baseline, expected to last >3days In/Out Bed: Dependent Is this a change from baseline?: Change from baseline, expected to last >3 days Walks in Home: Dependent Is this a change from baseline?: Change from baseline, expected to last >3 days Does the patient have difficulty walking or climbing stairs?: Yes (secondary to left knee issues) Weakness of Legs: Left Weakness of Arms/Hands: None  Permission Sought/Granted                  Emotional Assessment              Admission diagnosis:  Toe infection [L08.9] Patient Active Problem List   Diagnosis Date Noted   Elevated LFTs    Post-operative state    Cellulitis of left lower extremity    Streptococcal bacteremia    Cellulitis of right lower extremity    Osteomyelitis of great toe of right foot (HCC)    Osteomyelitis of second toe of right foot (HCC)    Diabetes mellitus due to underlying condition with diabetic autonomic neuropathy,  without long-term current use of insulin (HCC)    Class 3 severe obesity with serious comorbidity and body mass index (BMI) of 45.0 to 49.9 in adult [facility])    Toe infection 08/08/2021   CAD (coronary artery disease) of artery bypass graft 04/21/2015   Hyperlipidemia 04/21/2015   Essential hypertension 04/21/2015   Type 2 diabetes mellitus (HCC) 04/21/2015   Ulcer of foot (HCC) 08/22/2014   PCP:  Tally Joe, MD Pharmacy:   [facility] - [place], [place] -  [address] [place] [address] Phone: [phone] Fax: [phone]     Social Determinants of Health (SDOH) Interventions    Readmission Risk Interventions No flowsheet data found.

## 2021-08-12 NOTE — Progress Notes (Signed)
PHARMACY CONSULT NOTE FOR:  OUTPATIENT  PARENTERAL ANTIBIOTIC THERAPY (OPAT)  Indication: streptococcus intermedius bacteremia and toe infection Regimen: penicillin 18 million units IV daily as a continuous infusion + metronidazole 500mg  po BID End date: 09/08/21  IV antibiotic discharge orders are pended. To discharging provider:  please sign these orders via discharge navigator,  Select New Orders & click on the button choice - Manage This Unsigned Work.     Thank you for allowing pharmacy to be a part of this patient's care.  09/10/21 08/12/2021, 2:03 PM

## 2021-08-12 NOTE — Progress Notes (Signed)
Inpatient Diabetes Program Recommendations  AACE/ADA: New Consensus Statement on Inpatient Glycemic Control (2015)  Target Ranges:  Prepandial:   less than 140 mg/dL      Peak postprandial:   less than 180 mg/dL (1-2 hours)      Critically ill patients:  140 - 180 mg/dL   Lab Results  Component Value Date   GLUCAP 209 (H) 08/12/2021   HGBA1C 6.2 (H) 08/08/2021    Review of Glycemic Control Results for [name] [name], [name] [name] (MRN [id number]) as of 08/12/2021 10:48  Ref. Range 08/11/2021 07:32 08/11/2021 11:30 08/11/2021 16:06 08/11/2021 21:07 08/12/2021 07:41  Glucose-Capillary Latest Ref Range: 70 - 99 mg/dL 947 (H) 125 (H) 271 (H) 191 (H) 209 (H)   Diabetes history: DM2 Outpatient Diabetes medications: Humalog 50/50 40 units TID, Ozempic 1 mg weekly Current orders for Inpatient glycemic control: Semglee 10 units Daily, Novolog 0-15 units TID with meals + HS + 3 units TID  HgbA1C - 6.2% - may not be accurate with low H/H CBG this am -  209 mg/dL   Inpatient Diabetes Program Recommendations:    Consider adding Semglee 10 units BID Increase Novolog to 5 units TID if eating > 50% meal  Follow glucose trends.  Thank,  Christena Deem RN, MSN, BC-ADM Inpatient Diabetes Coordinator Team Pager [phone] (8a-5p)

## 2021-08-13 ENCOUNTER — Inpatient Hospital Stay (HOSPITAL_COMMUNITY): Payer: 59 | Admitting: Registered Nurse

## 2021-08-13 ENCOUNTER — Encounter (HOSPITAL_COMMUNITY)
Admission: EM | Disposition: A | Discharge status: Home Health | Payer: Self-pay | Source: Ambulatory Visit | Attending: Internal Medicine

## 2021-08-13 DIAGNOSIS — L03116 Cellulitis of left lower limb: Secondary | ICD-10-CM | POA: Diagnosis not present

## 2021-08-13 DIAGNOSIS — R7881 Bacteremia: Secondary | ICD-10-CM | POA: Diagnosis not present

## 2021-08-13 DIAGNOSIS — L02611 Cutaneous abscess of right foot: Secondary | ICD-10-CM

## 2021-08-13 DIAGNOSIS — M869 Osteomyelitis, unspecified: Secondary | ICD-10-CM | POA: Diagnosis not present

## 2021-08-13 HISTORY — PX: INCISION AND DRAINAGE: SHX5863

## 2021-08-13 LAB — CULTURE, BLOOD (ROUTINE X 2)

## 2021-08-13 LAB — GLUCOSE, CAPILLARY
Glucose-Capillary: 236 mg/dL — ABNORMAL HIGH (ref 70–99)
Glucose-Capillary: 237 mg/dL — ABNORMAL HIGH (ref 70–99)
Glucose-Capillary: 239 mg/dL — ABNORMAL HIGH (ref 70–99)
Glucose-Capillary: 194 mg/dL — ABNORMAL HIGH (ref 70–99)
Glucose-Capillary: 243 mg/dL — ABNORMAL HIGH (ref 70–99)

## 2021-08-13 SURGERY — INCISION AND DRAINAGE
Anesthesia: Monitor Anesthesia Care | Site: Foot | Laterality: Right

## 2021-08-13 MED ORDER — MIDAZOLAM HCL 2 MG/2ML IJ SOLN
INTRAMUSCULAR | Status: AC
  Filled 2021-08-13: qty 2

## 2021-08-13 MED ORDER — VANCOMYCIN HCL 1000 MG IV SOLR
INTRAVENOUS | Status: AC
  Filled 2021-08-13: qty 20

## 2021-08-13 MED ORDER — ONDANSETRON HCL 4 MG/2ML IJ SOLN
INTRAMUSCULAR | Status: DC | PRN
  Administered 2021-08-13: 4 mg via INTRAVENOUS

## 2021-08-13 MED ORDER — LIDOCAINE HCL 2 % IJ SOLN
INTRAMUSCULAR | Status: AC
  Filled 2021-08-13: qty 20

## 2021-08-13 MED ORDER — SODIUM CHLORIDE 0.9% FLUSH
10.0000 mL | Freq: Two times a day (BID) | INTRAVENOUS | Status: DC
  Administered 2021-08-13 – 2021-08-21 (×12): 10 mL

## 2021-08-13 MED ORDER — ONDANSETRON HCL 4 MG/2ML IJ SOLN
INTRAMUSCULAR | Status: AC
  Filled 2021-08-13: qty 2

## 2021-08-13 MED ORDER — PROPOFOL 10 MG/ML IV BOLUS
INTRAVENOUS | Status: AC
  Filled 2021-08-13: qty 20

## 2021-08-13 MED ORDER — BUPIVACAINE HCL (PF) 0.5 % IJ SOLN
INTRAMUSCULAR | Status: AC
  Filled 2021-08-13: qty 30

## 2021-08-13 MED ORDER — ALBUMIN HUMAN 5 % IV SOLN: INTRAVENOUS | Status: DC | PRN

## 2021-08-13 MED ORDER — SODIUM CHLORIDE 0.9 % IR SOLN
Status: DC | PRN
  Administered 2021-08-13: 3000 mL

## 2021-08-13 MED ORDER — SODIUM CHLORIDE 0.9% FLUSH: 10.0000 mL | INTRAVENOUS | Status: DC | PRN

## 2021-08-13 MED ORDER — FENTANYL CITRATE PF 50 MCG/ML IJ SOSY: 25.0000 ug | PREFILLED_SYRINGE | INTRAMUSCULAR | Status: DC | PRN

## 2021-08-13 MED ORDER — FENTANYL CITRATE (PF) 100 MCG/2ML IJ SOLN
INTRAMUSCULAR | Status: DC | PRN
  Administered 2021-08-13: 100 ug via INTRAVENOUS

## 2021-08-13 MED ORDER — 0.9 % SODIUM CHLORIDE (POUR BTL) OPTIME
TOPICAL | Status: DC | PRN
  Administered 2021-08-13: 1000 mL

## 2021-08-13 MED ORDER — VANCOMYCIN HCL 1000 MG IV SOLR
INTRAVENOUS | Status: DC | PRN
  Administered 2021-08-13: 1000 mg via TOPICAL

## 2021-08-13 MED ORDER — PROPOFOL 500 MG/50ML IV EMUL
INTRAVENOUS | Status: DC | PRN
  Administered 2021-08-13: 100 ug/kg/min via INTRAVENOUS

## 2021-08-13 MED ORDER — PHENYLEPHRINE HCL (PRESSORS) 10 MG/ML IV SOLN
INTRAVENOUS | Status: AC
  Filled 2021-08-13: qty 2

## 2021-08-13 MED ORDER — FENTANYL CITRATE (PF) 250 MCG/5ML IJ SOLN
INTRAMUSCULAR | Status: AC
  Filled 2021-08-13: qty 5

## 2021-08-13 MED ORDER — LIDOCAINE-EPINEPHRINE 1 %-1:100000 IJ SOLN
INTRAMUSCULAR | Status: AC
  Filled 2021-08-13: qty 1

## 2021-08-13 MED ORDER — MIDAZOLAM HCL 5 MG/5ML IJ SOLN
INTRAMUSCULAR | Status: DC | PRN
  Administered 2021-08-13 (×2): 1 mg via INTRAVENOUS

## 2021-08-13 MED ORDER — BUPIVACAINE HCL (PF) 0.5 % IJ SOLN
INTRAMUSCULAR | Status: DC | PRN
  Administered 2021-08-13: 5 mL

## 2021-08-13 MED ORDER — LIDOCAINE HCL 2 % IJ SOLN
INTRAMUSCULAR | Status: DC | PRN
  Administered 2021-08-13: 5 mL

## 2021-08-13 MED ORDER — CHLORHEXIDINE GLUCONATE CLOTH 2 % EX PADS
6.0000 | MEDICATED_PAD | Freq: Every day | CUTANEOUS | Status: DC
  Administered 2021-08-14 – 2021-08-21 (×8): 6 via TOPICAL

## 2021-08-13 MED ORDER — PHENYLEPHRINE HCL-NACL 20-0.9 MG/250ML-% IV SOLN
INTRAVENOUS | Status: DC | PRN
  Administered 2021-08-13: 25 ug/min via INTRAVENOUS

## 2021-08-13 MED ORDER — LACTATED RINGERS IV SOLN: INTRAVENOUS | Status: DC | PRN

## 2021-08-13 SURGICAL SUPPLY — 59 items
BAG COUNTER SPONGE SURGICOUNT (BAG) IMPLANT
BAG SPNG CNTER NS LX DISP (BAG)
BLADE HEX COATED 2.75 (ELECTRODE) ×1 IMPLANT
BLADE OSCILLATING/SAGITTAL (BLADE)
BLADE SURG 15 STRL LF DISP TIS (BLADE) ×2 IMPLANT
BLADE SURG 15 STRL SS (BLADE) ×4
BLADE SW THK.38XMED LNG THN (BLADE) ×1 IMPLANT
BNDG CMPR 9X4 STRL LF SNTH (GAUZE/BANDAGES/DRESSINGS) ×1
BNDG CONFORM 2 STRL LF (GAUZE/BANDAGES/DRESSINGS) ×2 IMPLANT
BNDG ELASTIC 3X5.8 VLCR STR LF (GAUZE/BANDAGES/DRESSINGS) ×2 IMPLANT
BNDG ELASTIC 6X5.8 VLCR STR LF (GAUZE/BANDAGES/DRESSINGS) ×2 IMPLANT
BNDG ESMARK 4X9 LF (GAUZE/BANDAGES/DRESSINGS) ×2 IMPLANT
BNDG GAUZE ELAST 4 BULKY (GAUZE/BANDAGES/DRESSINGS) ×2 IMPLANT
BUR EGG ELITE 4.0 (BURR) ×1 IMPLANT
COVER BACK TABLE 60X90IN (DRAPES) ×2 IMPLANT
CUFF TOURN SGL QUICK 18X4 (TOURNIQUET CUFF) ×1 IMPLANT
DRAIN CHANNEL 15F RND FF 3/16 (WOUND CARE) ×1 IMPLANT
DRAPE EXTREMITY T 121X128X90 (DISPOSABLE) ×2 IMPLANT
DRAPE IMP U-DRAPE 54X76 (DRAPES) ×2 IMPLANT
DRAPE OEC MINIVIEW 54X84 (DRAPES) ×1 IMPLANT
DRSG EMULSION OIL 3X3 NADH (GAUZE/BANDAGES/DRESSINGS) ×2 IMPLANT
DRSG PAD ABDOMINAL 8X10 ST (GAUZE/BANDAGES/DRESSINGS) ×1 IMPLANT
DURAPREP 26ML APPLICATOR (WOUND CARE) IMPLANT
ELECT REM PT RETURN 15FT ADLT (MISCELLANEOUS) ×2 IMPLANT
EVACUATOR SILICONE 100CC (DRAIN) ×1 IMPLANT
GAUZE 4X4 16PLY ~~LOC~~+RFID DBL (SPONGE) IMPLANT
GAUZE SPONGE 4X4 12PLY STRL (GAUZE/BANDAGES/DRESSINGS) ×2 IMPLANT
GAUZE XEROFORM 5X9 LF (GAUZE/BANDAGES/DRESSINGS) ×1 IMPLANT
GLOVE SURG ENC MOIS LTX SZ7.5 (GLOVE) ×4 IMPLANT
GLOVE SURG NEOP MICRO LF SZ7.5 (GLOVE) ×2 IMPLANT
GLOVE SURG UNDER POLY LF SZ7.5 (GLOVE) ×1 IMPLANT
GOWN STRL REUS W/ TWL LRG LVL3 (GOWN DISPOSABLE) ×1 IMPLANT
GOWN STRL REUS W/ TWL XL LVL3 (GOWN DISPOSABLE) ×1 IMPLANT
GOWN STRL REUS W/TWL LRG LVL3 (GOWN DISPOSABLE) ×2
GOWN STRL REUS W/TWL XL LVL3 (GOWN DISPOSABLE) ×2
KIT BASIN OR (CUSTOM PROCEDURE TRAY) ×2 IMPLANT
NDL HYPO 25X1 1.5 SAFETY (NEEDLE) ×2 IMPLANT
NDL SAFETY ECLIPSE 18X1.5 (NEEDLE) IMPLANT
NEEDLE HYPO 18GX1.5 SHARP (NEEDLE)
NEEDLE HYPO 25X1 1.5 SAFETY (NEEDLE) ×4 IMPLANT
NS IRRIG 1000ML POUR BTL (IV SOLUTION) IMPLANT
PADDING CAST ABS 4INX4YD NS (CAST SUPPLIES) ×1
PADDING CAST ABS COTTON 4X4 ST (CAST SUPPLIES) ×1 IMPLANT
PENCIL SMOKE EVACUATOR (MISCELLANEOUS) ×2 IMPLANT
STOCKINETTE 6  STRL (DRAPES) ×2
STOCKINETTE 6 STRL (DRAPES) ×1 IMPLANT
STRIP SUTURE WOUND CLOSURE 1/2 (MISCELLANEOUS) IMPLANT
SUT ETHILON 2 0 PS N (SUTURE) ×1 IMPLANT
SUT ETHILON 3 0 PS 1 (SUTURE) ×2 IMPLANT
SUT MNCRL AB 3-0 PS2 18 (SUTURE) ×2 IMPLANT
SUT MNCRL AB 4-0 PS2 18 (SUTURE) IMPLANT
SUT MON AB 5-0 PS2 18 (SUTURE) IMPLANT
SUT PROLENE 2 0 SH DA (SUTURE) ×1 IMPLANT
SUT PROLENE 3 0 PS 2 (SUTURE) ×2 IMPLANT
SYR 10ML LL (SYRINGE) IMPLANT
SYR BULB EAR ULCER 3OZ GRN STR (SYRINGE) ×2 IMPLANT
SYR CONTROL 10ML LL (SYRINGE) ×4 IMPLANT
TUBING CONNECTING 10 (TUBING) ×2 IMPLANT
UNDERPAD 30X36 HEAVY ABSORB (UNDERPADS AND DIAPERS) ×2 IMPLANT

## 2021-08-13 NOTE — Anesthesia Postprocedure Evaluation (Signed)
Anesthesia Post Note  Patient: [name] [name]  Procedure(s) Performed: INCISION AND DRAINAGE RIGHT FOOT WOUND (Right: Foot)     Patient location during evaluation: PACU Anesthesia Type: MAC Level of consciousness: awake and alert Pain management: pain level controlled Vital Signs Assessment: post-procedure vital signs reviewed and stable Respiratory status: spontaneous breathing, nonlabored ventilation, respiratory function stable and patient connected to nasal cannula oxygen Cardiovascular status: blood pressure returned to baseline and stable Postop Assessment: no apparent nausea or vomiting Anesthetic complications: no   No notable events documented.  Last Vitals:  Vitals:   08/13/21 0950 08/13/21 1000  BP: 104/65 116/63  Pulse: 61 63  Resp: 12 11  Temp:  (!) 36.3 C  SpO2: 94% 93%    Last Pain:  Vitals:   08/13/21 1000  TempSrc:   PainSc: 0-No pain                 Blakleigh Straw L Magdalene Tardiff

## 2021-08-13 NOTE — Brief Op Note (Signed)
08/13/2021  9:19 AM  PATIENT:  [name] [name]  65 y.o. male  PRE-OPERATIVE DIAGNOSIS:  Abscess  POST-OPERATIVE DIAGNOSIS:  Abscess  PROCEDURE:  Procedure(s): INCISION AND DRAINAGE RIGHT FOOT WOUND (Right)  SURGEON:  Surgeon(s) and Role:    Trula Slade, DPM - Primary  PHYSICIAN ASSISTANT:   ASSISTANTS: none   ANESTHESIA:   MAC  EBL:  200 mL   BLOOD ADMINISTERED:none  DRAINS: none   LOCAL MEDICATIONS USED:  OTHER 10 cc lidocaine and marcaine plain  SPECIMEN:  No Specimen  DISPOSITION OF SPECIMEN:  N/A  COUNTS:  YES  TOURNIQUET:  * Missing tourniquet times found for documented tourniquets in log: [id number] *  DICTATION: .Dragon Dictation  PLAN OF CARE: Admit to inpatient   PATIENT DISPOSITION:  PACU - hemodynamically stable.   Delay start of Pharmacological VTE agent (>24hrs) due to surgical blood loss or risk of bleeding: no  Intraoperative findings: Purulence still noted tracking dorsal and plantar. Debrided infected, nonviable tissue. Secondary incision made dorsal foot. At the conclusion no further purulence noted. Irrigated with 3L saline, applied vancomycin powder, closed incision, JP drain placed. Continue antibiotics per ID. NWB. Will hopefully get him up with PT tomorrow.

## 2021-08-13 NOTE — TOC Progression Note (Signed)
To OR for I and D of right great toe/foot accompanied by two NT.

## 2021-08-13 NOTE — Anesthesia Preprocedure Evaluation (Signed)
Anesthesia Evaluation  Patient identified by MRN, date of birth, ID band Patient awake    Reviewed: Allergy & Precautions, NPO status , Patient's Chart, lab work & pertinent test results, reviewed documented beta blocker date and time   Airway Mallampati: I  TM Distance: >3 FB Neck ROM: Full    Dental  (+) Chipped, Dental Advisory Given,    Pulmonary sleep apnea and Continuous Positive Airway Pressure Ventilation ,    Pulmonary exam normal breath sounds clear to auscultation       Cardiovascular hypertension, Pt. on home beta blockers and Pt. on medications + CAD and + CABG (CABGx5 2011)  Normal cardiovascular exam Rhythm:Regular Rate:Normal  TTE 2022 1. Left ventricular ejection fraction, by estimation, is 55 to 60%. The  left ventricle has normal function. The left ventricle has no regional  wall motion abnormalities. There is mild left ventricular hypertrophy.  Left ventricular diastolic parameters  are consistent with Grade I diastolic dysfunction (impaired relaxation).  2. Right ventricular systolic function is normal. The right ventricular  size is normal. Tricuspid regurgitation signal is inadequate for assessing  PA pressure.  3. The mitral valve is grossly normal. Trivial mitral valve  regurgitation. No evidence of mitral stenosis.  4. The aortic valve is tricuspid. Aortic valve regurgitation is not  visualized. No aortic stenosis is present.  5. The inferior vena cava is normal in size with greater than 50%  respiratory variability, suggesting right atrial pressure of 3 mmHg.   Neuro/Psych PSYCHIATRIC DISORDERS Anxiety negative neurological ROS     GI/Hepatic Neg liver ROS, GERD  ,  Endo/Other  diabetes, Poorly Controlled, Type 2, Insulin Dependent  Renal/GU Renal InsufficiencyRenal diseaseLab Results      Component                Value               Date                      CREATININE               1.26  (H)            08/12/2021                BUN                      32 (H)              08/12/2021                NA                       127 (L)             08/12/2021                K                        5.0                 08/12/2021                CL                       94 (L)              08/12/2021  CO2                      24                  08/12/2021             negative genitourinary   Musculoskeletal negative musculoskeletal ROS (+)   Abdominal   Peds  Hematology negative hematology ROS (+)   Anesthesia Other Findings Right big toe nonhealing diabetic ulcer  Reproductive/Obstetrics                             Anesthesia Physical Anesthesia Plan  ASA: 3  Anesthesia Plan: MAC   Post-op Pain Management:    Induction: Intravenous  PONV Risk Score and Plan: 1 and Propofol infusion, Treatment may vary due to age or medical condition and Ondansetron  Airway Management Planned: Natural Airway  Additional Equipment:   Intra-op Plan:   Post-operative Plan:   Informed Consent: I have reviewed the patients History and Physical, chart, labs and discussed the procedure including the risks, benefits and alternatives for the proposed anesthesia with the patient or authorized representative who has indicated his/her understanding and acceptance.     Dental advisory given  Plan Discussed with: CRNA  Anesthesia Plan Comments:         Anesthesia Quick Evaluation

## 2021-08-13 NOTE — H&P (View-Only) (Signed)
Patient seen and evaluated in preop.  He is doing well this morning no significant pain.  He has not had any fevers or chills overnight he reports.  No recent blood work to review from this morning.  However given increased white blood cell count yesterday recommended to return to the operating room for debridement of the wound and hopeful closure.  His wife is at bedside.  We did discuss the surgery as well as postoperative course.  Consent was signed.  No further questions.  N.p.o. confirmed.  We will proceed as scheduled.

## 2021-08-13 NOTE — Progress Notes (Signed)
PROGRESS NOTE    [name] [name]  NWG:[id number] DOB: September 01, 1956 DOA: 08/08/2021 PCP: Tally Joe, MD    Chief Complaint  Patient presents with   Wound Infection    Brief Narrative:  65 year old gentleman prior history of hypertension, type 2 diabetes, hyperlipidemia presents to the hospital with left lower extremity cellulitis as well as right great toe nonhealing wound with pain and tenderness.  Patient reports that over the last 1 week prior to admission patient had worsening erythema pain and foul-smelling discharge from the right toe.  He follows up with wound care.  Podiatry and ID were consulted and patient underwent partial first ray amputation of the right foot, partial second toe amputation and debridement of the deep abscess in the right foot by Dr. Loreta Ave on 08/09/2021.  ID on board, appreciate input.   Assessment & Plan:   Active Problems:   Toe infection   Cellulitis of right lower extremity   Osteomyelitis of great toe of right foot (HCC)   Osteomyelitis of second toe of right foot (HCC)   Diabetes mellitus due to underlying condition with diabetic autonomic neuropathy, without long-term current use of insulin (HCC)   Class 3 severe obesity with serious comorbidity and body mass index (BMI) of 45.0 to 49.9 in adult [facility]   Elevated LFTs   Post-operative state   Cellulitis of left lower extremity   Streptococcal bacteremia   Right foot ulcer, MRI on admission of the right foot was concerning for septic arthritis and osteomyelitis of the first proximal and distal phalanges as well as osteomyelitis at the second distal phalanx. It also showed infectious tenosynovitis around the first metatarsal and multiple forefoot abscesses.  Podiatry consulted and he underwent partial first ray amputation, partial second toe amputation and debridement of the deep abscess of the right foot by Dr. Loreta Ave on 08/09/2021.  ID on board and appreciate recommendations. He was started on IV  rocephin, transitioned to IV penicillin and flagyl.  MRI of the left foot does not show any osteomyelitis. Leukocytosis continues to worsen.  Cultures from the right foot shows abundant gram positive cocci in pairs and clusters, possibly streptococcus agalactiae. In view of worsening leukocytosis, pt was taken to further debridement of the foot today by Dr Ardelle Anton.    Streptococcus intermedius bacteremia: from cultures drawn on 08/08/21 Possibly from diabetic foot infection.  Echocardiogram does not show any evidence of valvular vegetations.  Blood cultures are repeated.  Negative so far,  PICC line to be placed.  ID recommended 4 weeks of IV penicillin and flagyl.    Left lower extremity cellulitis Improving.     Elevated liver enzymes Same patient currently denies any nausea and vomiting, continue to monitor.  Right upper quadrant ultrasound showed cholelithiasis.  Patient is currently asymptomatic.   Stage IIIa CKD Baseline creatinine around 1.5-1.7 over the last 7 years Patient's current creatinine better than baseline.   Type 2 diabetes mellitus with hyperglycemia and with CKD Continue with sliding scale insulin. CBG (last 3)  Recent Labs    08/13/21 0646 08/13/21 0910 08/13/21 1033  GLUCAP 236* 237* 239*   Last hemoglobin A1c around 6.2 Patient is on Ozempic at home which is on hold.  Increase in Semglee 10 units BID ,increase novolog 5 units TIDAC.   Hyperlipidemia Statin on hold due to elevated liver enzymes.   Hyponatremia:  Possibly from IV lasix. Vs Pseudohyponatremia from hyperglycemia.  TSH, cortisol, urine sodium and serum osmo ordered for further eval.  Sodium has  improved to 127.  Serum osmo is 288. Cortisol is 7.1, urine sodium is 63, TSH is wnl.   Hypertension:  Well controlled.  Will add hydralazine prn to the amlodipine, avapro and metoprolol   DVT prophylaxis: (Lovenox) Code Status: (Full code) Family Communication: (None at bedside).   Disposition:   Status is: Inpatient  Remains inpatient appropriate because:Ongoing diagnostic testing needed not appropriate for outpatient work up and IV treatments appropriate due to intensity of illness or inability to take PO  Dispo: The patient is from: Home              Anticipated d/c is to: Home              Patient currently is not medically stable to d/c.   Difficult to place patient No       Consultants:  Podiatry  ID.   Procedures:  Partial 1st ray amputation right foot; partial 2nd toe amputation, I&D deep abscess right foot.  by dr Ardelle Anton on 08/09/21  Antimicrobials:  Antibiotics Given (last 72 hours)     Date/Time Action Medication Dose Rate   08/11/21 0949 New Bag/Given   cefTRIAXone (ROCEPHIN) 2 g in sodium chloride 0.9 % 100 mL IVPB 2 g 200 mL/hr   08/12/21 0837 New Bag/Given   cefTRIAXone (ROCEPHIN) 2 g in sodium chloride 0.9 % 100 mL IVPB 2 g 200 mL/hr   08/12/21 1444 Given   metroNIDAZOLE (FLAGYL) tablet 500 mg 500 mg    08/12/21 2151 Given   metroNIDAZOLE (FLAGYL) tablet 500 mg 500 mg    08/13/21 0841 Given   vancomycin (VANCOCIN) powder 1,000 mg    08/13/21 1256 Given  [Patient at surgery, too drowsy to swallow pills when back on unit]   metroNIDAZOLE (FLAGYL) tablet 500 mg 500 mg    08/13/21 1309 New Bag/Given   penicillin G potassium 9 Million Units in dextrose 5 % 500 mL continuous infusion 9 Million Units 41.7 mL/hr         Subjective: Sleepy today.   Objective: Vitals:   08/13/21 0940 08/13/21 0950 08/13/21 1000 08/13/21 1248  BP: 118/69 104/65 116/63 140/71  Pulse: (!) 58 61 63 75  Resp: 12 12 11 18   Temp:   (!) 97.3 F (36.3 C) 98 F (36.7 C)  TempSrc:    Oral  SpO2: 98% 94% 93% 100%  Weight:      Height:        Intake/Output Summary (Last 24 hours) at 08/13/2021 1548 Last data filed at 08/13/2021 1309 Gross per 24 hour  Intake 1450 ml  Output 2710 ml  Net -1260 ml    Filed Weights   08/08/21 1149 08/08/21 1738  08/09/21 1625  Weight: 112.9 kg 118.6 kg 118.6 kg    Examination:  General exam: Appears calm and comfortable  Respiratory system: Clear to auscultation. Respiratory effort normal. Cardiovascular system: S1 & S2 heard, RRR. No JVD, . No pedal edema. Gastrointestinal system: Abdomen is nondistended, soft and nontender.Normal bowel sounds heard. Central nervous system: Alert and oriented. No focal neurological deficits. Extremities: right foot bandaged.  Skin: No rashes, lesions or ulcers Psychiatry:  Mood & affect appropriate.        Data Reviewed: I have personally reviewed following labs and imaging studies  CBC: Recent Labs  Lab 08/08/21 1307 08/08/21 1807 08/09/21 0546 08/10/21 0503 08/11/21 0846 08/12/21 0948  WBC 19.2* 18.0* 20.2* 24.8* 25.2* 29.1*  NEUTROABS 17.0*  --   --   --  20.7* 22.1*  HGB 13.4 12.0* 12.5* 12.1* 12.1* 12.3*  HCT 40.3 35.2* 36.9* 35.6* 35.6* 35.5*  MCV 87.6 85.9 85.2 86.6 84.8 84.1  PLT 295 277 255 271 245 287     Basic Metabolic Panel: Recent Labs  Lab 08/08/21 1307 08/08/21 1807 08/09/21 0546 08/10/21 0503 08/11/21 0846 08/12/21 0459  NA 139  --  135 134* 126* 127*  K 3.9  --  3.5 3.7 4.5 5.0  CL 102  --  99 97* 90* 94*  CO2 24  --  25 28 25 24   GLUCOSE 153*  --  195* 191* 347* 221*  BUN 35*  --  27* 30* 27* 32*  CREATININE 1.63* 1.51* 1.21 1.25* 1.17 1.26*  CALCIUM 7.6* 6.9* 7.5* 8.3* 8.9 9.5     GFR: Estimated Creatinine Clearance: 79.8 mL/min (A) (by C-G formula based on SCr of 1.26 mg/dL (H)).  Liver Function Tests: Recent Labs  Lab 08/08/21 1307 08/09/21 0546  AST 63* 29  ALT 88* 64*  ALKPHOS 82 81  BILITOT 1.1 1.2  PROT 7.3 6.4*  ALBUMIN 3.2* 2.7*     CBG: Recent Labs  Lab 08/12/21 1726 08/12/21 2007 08/13/21 0646 08/13/21 0910 08/13/21 1033  GLUCAP 258* 219* 236* 237* 239*      Recent Results (from the past 240 hour(s))  Blood Cultures x 2 sites     Status: Abnormal (Preliminary result)    Collection Time: 08/08/21  1:07 PM   Specimen: BLOOD  Result Value Ref Range Status   Specimen Description BLOOD RIGHT ANTECUBITAL  Final   Special Requests   Final    BOTTLES DRAWN AEROBIC AND ANAEROBIC Blood Culture results may not be optimal due to an excessive volume of blood received in culture bottles   Culture  Setup Time   Final    GRAM POSITIVE COCCI IN BOTH AEROBIC AND ANAEROBIC BOTTLES Organism ID to follow CRITICAL RESULT CALLED TO, READ BACK BY AND VERIFIED WITH: 08/10/21 PHARMD Janine Limbo 08/09/21 A BROWNING    Culture STREPTOCOCCUS INTERMEDIUS (A)  Final   Report Status PENDING  Incomplete   Organism ID, Bacteria STREPTOCOCCUS INTERMEDIUS  Final      Susceptibility   Streptococcus intermedius - MIC*    PENICILLIN <=0.06 SENSITIVE Sensitive     CEFTRIAXONE <=0.12 SENSITIVE Sensitive     ERYTHROMYCIN >=8 RESISTANT Resistant     LEVOFLOXACIN <=0.25 SENSITIVE Sensitive     VANCOMYCIN Value in next row Sensitive      0.25 SENSITIVEPerformed at [facility], [address], [place], [place]    * STREPTOCOCCUS INTERMEDIUS  Blood Cultures x 2 sites     Status: Abnormal   Collection Time: 08/08/21  1:07 PM   Specimen: BLOOD  Result Value Ref Range Status   Specimen Description   Final    BLOOD BLOOD RIGHT FOREARM Performed at [facility], [address] [place], [place]    Special Requests   Final    BOTTLES DRAWN AEROBIC AND ANAEROBIC Blood Culture results may not be optimal due to an excessive volume of blood received in culture bottles Performed at [facility], [address] [place], [place]    Culture  Setup Time   Final    GRAM POSITIVE COCCI ANAEROBIC BOTTLE ONLY CRITICAL VALUE NOTED.  VALUE IS CONSISTENT WITH PREVIOUSLY REPORTED AND CALLED VALUE.    Culture (A)  Final    STREPTOCOCCUS INTERMEDIUS SUSCEPTIBILITIES PERFORMED ON PREVIOUS  CULTURE WITHIN THE LAST 5 DAYS. Performed at [facility], [address], [place], [place] [address]    Report Status 08/12/2021 FINAL  Final  Blood Culture ID Panel (Reflexed)     Status: Abnormal   Collection Time: 08/08/21  1:07 PM  Result Value Ref Range Status   Enterococcus faecalis NOT DETECTED NOT DETECTED Final   Enterococcus Faecium NOT DETECTED NOT DETECTED Final   Listeria monocytogenes NOT DETECTED NOT DETECTED Final   Staphylococcus species NOT DETECTED NOT DETECTED Final   Staphylococcus aureus (BCID) NOT DETECTED NOT DETECTED Final   Staphylococcus epidermidis NOT DETECTED NOT DETECTED Final   Staphylococcus lugdunensis NOT DETECTED NOT DETECTED Final   Streptococcus species DETECTED (A) NOT DETECTED Final    Comment: Not Enterococcus species, Streptococcus agalactiae, Streptococcus pyogenes, or Streptococcus pneumoniae. CRITICAL RESULT CALLED TO, READ BACK BY AND VERIFIED WITH: Janine Limbo PHARMD [id number] 08/09/21 A BROWNING    Streptococcus agalactiae NOT DETECTED NOT DETECTED Final   Streptococcus pneumoniae NOT DETECTED NOT DETECTED Final   Streptococcus pyogenes NOT DETECTED NOT DETECTED Final   A.calcoaceticus-baumannii NOT DETECTED NOT DETECTED Final   Bacteroides fragilis NOT DETECTED NOT DETECTED Final   Enterobacterales NOT DETECTED NOT DETECTED Final   Enterobacter cloacae complex NOT DETECTED NOT DETECTED Final   Escherichia coli NOT DETECTED NOT DETECTED Final   Klebsiella aerogenes NOT DETECTED NOT DETECTED Final   Klebsiella oxytoca NOT DETECTED NOT DETECTED Final   Klebsiella pneumoniae NOT DETECTED NOT DETECTED Final   Proteus species NOT DETECTED NOT DETECTED Final   Salmonella species NOT DETECTED NOT DETECTED Final   Serratia marcescens NOT DETECTED NOT DETECTED Final   Haemophilus influenzae NOT DETECTED NOT DETECTED Final   Neisseria meningitidis NOT DETECTED NOT DETECTED Final   Pseudomonas aeruginosa NOT DETECTED NOT DETECTED Final   Stenotrophomonas maltophilia NOT DETECTED NOT DETECTED Final    Candida albicans NOT DETECTED NOT DETECTED Final   Candida auris NOT DETECTED NOT DETECTED Final   Candida glabrata NOT DETECTED NOT DETECTED Final   Candida krusei NOT DETECTED NOT DETECTED Final   Candida parapsilosis NOT DETECTED NOT DETECTED Final   Candida tropicalis NOT DETECTED NOT DETECTED Final   Cryptococcus neoformans/gattii NOT DETECTED NOT DETECTED Final    Comment: Performed at [facility], [address] [place], [place] [address]  Resp Panel by RT-PCR (Flu A&B, Covid) Nasopharyngeal Swab     Status: None   Collection Time: 08/08/21  3:00 PM   Specimen: Nasopharyngeal Swab; Nasopharyngeal(NP) swabs in vial transport medium  Result Value Ref Range Status   SARS Coronavirus 2 by RT PCR NEGATIVE NEGATIVE Final    Comment: (NOTE) SARS-CoV-2 target nucleic acids are NOT DETECTED.  The SARS-CoV-2 RNA is generally detectable in upper respiratory specimens during the acute phase of infection. The lowest concentration of SARS-CoV-2 viral copies this assay can detect is 138 copies/mL. A negative result does not preclude SARS-Cov-2 infection and should not be used as the sole basis for treatment or other patient management decisions. A negative result may occur with  improper specimen collection/handling, submission of specimen other than nasopharyngeal swab, presence of viral mutation(s) within the areas targeted by this assay, and inadequate number of viral copies(<138 copies/mL). A negative result must be combined with clinical observations, patient history, and epidemiological information. The expected result is Negative.  Fact Sheet for Patients:  BloggerCourse.com  Fact Sheet for Healthcare Providers:  SeriousBroker.it  This test is no t yet approved or cleared  by the [place] and  has been authorized for detection and/or diagnosis of SARS-CoV-2 by FDA under an Emergency Use Authorization (EUA). This EUA  will remain  in effect (meaning this test can be used) for the duration of the COVID-19 declaration under Section 564(b)(1) of the Act, 21 U.S.C.section 360bbb-3(b)(1), unless the authorization is terminated  or revoked sooner.       Influenza A by PCR NEGATIVE NEGATIVE Final   Influenza B by PCR NEGATIVE NEGATIVE Final    Comment: (NOTE) The Xpert Xpress SARS-CoV-2/FLU/RSV plus assay is intended as an aid in the diagnosis of influenza from Nasopharyngeal swab specimens and should not be used as a sole basis for treatment. Nasal washings and aspirates are unacceptable for Xpert Xpress SARS-CoV-2/FLU/RSV testing.  Fact Sheet for Patients: BloggerCourse.com  Fact Sheet for Healthcare Providers: SeriousBroker.it  This test is not yet approved or cleared by the [place] FDA and has been authorized for detection and/or diagnosis of SARS-CoV-2 by FDA under an Emergency Use Authorization (EUA). This EUA will remain in effect (meaning this test can be used) for the duration of the COVID-19 declaration under Section 564(b)(1) of the Act, 21 U.S.C. section 360bbb-3(b)(1), unless the authorization is terminated or revoked.  Performed at [facility], [address] [place], [place] [address]   Aerobic/Anaerobic Culture w Gram Stain (surgical/deep wound)     Status: None (Preliminary result)   Collection Time: 08/09/21  6:18 PM   Specimen: Abscess  Result Value Ref Range Status   Specimen Description   Final    ABSCESS RIGHT BIG TOE Performed at [facility], [address] [place], [place] [address]    Special Requests   Final    NONE Performed at [facility], [address] [place], [place] [address]    Gram Stain   Final    RARE WBC PRESENT, PREDOMINANTLY MONONUCLEAR ABUNDANT GRAM POSITIVE COCCI IN PAIRS ABUNDANT GRAM POSITIVE COCCI IN CLUSTERS    Culture    Final    FEW STREPTOCOCCUS AGALACTIAE TESTING AGAINST S. AGALACTIAE NOT ROUTINELY PERFORMED DUE TO PREDICTABILITY OF AMP/PEN/VAN SUSCEPTIBILITY. MODERATE STREPTOCOCCUS INTERMEDIUS SUSCEPTIBILITIES TO FOLLOW FEW PREVOTELLA BIVIA BETA LACTAMASE POSITIVE Performed at [facility], [address] [place], [place] [address]    Report Status PENDING  Incomplete  Culture, blood (Routine X 2) w Reflex to ID Panel     Status: None (Preliminary result)   Collection Time: 08/11/21 10:37 AM   Specimen: BLOOD  Result Value Ref Range Status   Specimen Description   Final    BLOOD BLOOD RIGHT FOREARM Performed at [facility], [address], [place], [place] [address]    Special Requests   Final    BOTTLES DRAWN AEROBIC AND ANAEROBIC Blood Culture adequate volume Performed at [facility], [address], [place], [place] [address]    Culture   Final    NO GROWTH 2 DAYS Performed at [facility], [address] [place], [place] [address]    Report Status PENDING  Incomplete  Culture, blood (Routine X 2) w Reflex to ID Panel     Status: None (Preliminary result)   Collection Time: 08/11/21 10:41 AM   Specimen: BLOOD  Result Value Ref Range Status   Specimen Description   Final    BLOOD BLOOD LEFT FOREARM Performed at [facility], [address] [place], [place] [address]    Special  Requests   Final    BOTTLES DRAWN AEROBIC AND ANAEROBIC Blood Culture results may not be optimal due to an excessive volume of blood received in culture bottles Performed at [facility], [address], [place], [place] [address]    Culture   Final    NO GROWTH 2 DAYS Performed at [facility], [address], [place], [place] [address]    Report Status PENDING  Incomplete          Radiology Studies: [place] EKG SITE RITE  Result Date: 08/12/2021 If Site Rite image not attached, placement could not be  confirmed due to current cardiac rhythm.       Scheduled Meds:  amLODipine  10 mg Oral QHS   aspirin  325 mg Oral Daily   buPROPion  300 mg Oral Daily   calcium carbonate  400 mg of elemental calcium Oral BID   enoxaparin (LOVENOX) injection  60 mg Subcutaneous Q24H   insulin aspart  0-15 Units Subcutaneous TID WC   insulin aspart  0-5 Units Subcutaneous QHS   insulin aspart  5 Units Subcutaneous TID WC   insulin glargine-yfgn  10 Units Subcutaneous BID   irbesartan  300 mg Oral Daily   metoprolol tartrate  100 mg Oral BID   metroNIDAZOLE  500 mg Oral Q12H   omega-3 acid ethyl esters  1 g Oral Daily   pantoprazole  40 mg Oral Daily   senna-docusate  2 tablet Oral BID   Vitamin D (Ergocalciferol)  50,000 Units Oral Q7 days   Continuous Infusions:  penicillin g continuous IV infusion 9 Million Units (08/13/21 1309)     LOS: 5 days        Kathlen Mody, MD [facility]   To contact the attending provider between 7A-7P or the covering provider during after hours 7P-7A, please log into the web site [email or web address] and access using universal [place] password for that web site. If you do not have the password, please call the hospital operator.  08/13/2021, 3:48 PM

## 2021-08-13 NOTE — Anesthesia Procedure Notes (Signed)
Procedure Name: MAC Date/Time: 08/13/2021 7:38 AM Performed by: Lissa Morales, CRNA Pre-anesthesia Checklist: Patient identified, Emergency Drugs available, Suction available and Patient being monitored Patient Re-evaluated:Patient Re-evaluated prior to induction Oxygen Delivery Method: Simple face mask Placement Confirmation: positive ETCO2

## 2021-08-13 NOTE — Progress Notes (Signed)
Peripherally Inserted Central Catheter Placement  The IV Nurse has discussed with the patient and/or persons authorized to consent for the patient, the purpose of this procedure and the potential benefits and risks involved with this procedure.  The benefits include less needle sticks, lab draws from the catheter, and the patient may be discharged home with the catheter. Risks include, but not limited to, infection, bleeding, blood clot (thrombus formation), and puncture of an artery; nerve damage and irregular heartbeat and possibility to perform a PICC exchange if needed/ordered by physician.  Alternatives to this procedure were also discussed.  Bard Power PICC patient education guide, fact sheet on infection prevention and patient information card has been provided to patient /or left at bedside.    PICC Placement Documentation  PICC Single Lumen 08/13/21 Right Brachial 43 cm 0 cm (Active)  Indication for Insertion or Continuance of Line Home intravenous therapies (PICC only) 08/13/21 1729  Exposed Catheter (cm) 0 cm 08/13/21 1729  Site Assessment Clean;Dry;Intact 08/13/21 1729  Line Status Flushed;Saline locked;Blood return noted 08/13/21 1729  Dressing Type Transparent 08/13/21 1729  Dressing Status Clean;Dry;Intact 08/13/21 1729  Antimicrobial disc in place? Yes 08/13/21 1729  Safety Lock Not Applicable 08/13/21 1729  Line Care Connections checked and tightened 08/13/21 1729  Line Adjustment (NICU/IV Team Only) No 08/13/21 1729  Dressing Intervention New dressing 08/13/21 1729  Dressing Change Due 08/20/21 08/13/21 1729       Elliot Dally 08/13/2021, 5:30 PM

## 2021-08-13 NOTE — Plan of Care (Signed)

## 2021-08-13 NOTE — Progress Notes (Signed)
Pt very drowsy with PICC insertion.  Wife called from waiting room to bedside.  Reviewed PICC and use and answered all questions to satisfaction.

## 2021-08-13 NOTE — Transfer of Care (Signed)
Immediate Anesthesia Transfer of Care Note  Patient: Marygrace Drought  Procedure(s) Performed: INCISION AND DRAINAGE RIGHT FOOT WOUND (Right: Foot)  Patient Location: PACU  Anesthesia Type:MAC  Level of Consciousness: sedated and patient cooperative  Airway & Oxygen Therapy: Patient Spontanous Breathing and Patient connected to face mask oxygen  Post-op Assessment: Report given to RN and Post -op Vital signs reviewed and stable  Post vital signs: stable  Last Vitals:  Vitals Value Taken Time  BP 96/60 08/13/21 0908  Temp 36.8 C 08/13/21 0908  Pulse 59 08/13/21 0913  Resp 11 08/13/21 0913  SpO2 100 % 08/13/21 0913  Vitals shown include unvalidated device data.  Last Pain:  Vitals:   08/13/21 0908  TempSrc:   PainSc: Asleep      Patients Stated Pain Goal: 3 (25/18/98 4210)  Complications: No notable events documented.

## 2021-08-13 NOTE — Progress Notes (Signed)
Patient seen and evaluated in preop.  He is doing well this morning no significant pain.  He has not had any fevers or chills overnight he reports.  No recent blood work to review from this morning.  However given increased white blood cell count yesterday recommended to return to the operating room for debridement of the wound and hopeful closure.  His wife is at bedside.  We did discuss the surgery as well as postoperative course.  Consent was signed.  No further questions.  N.p.o. confirmed.  We will proceed as scheduled. 

## 2021-08-13 NOTE — Progress Notes (Signed)
Patient refusing CPAP at this time

## 2021-08-13 NOTE — Interval H&P Note (Signed)
Anesthesia H&P Update: History and Physical Exam reviewed; patient is OK for planned anesthetic and procedure. ? ?

## 2021-08-14 ENCOUNTER — Encounter (HOSPITAL_COMMUNITY): Payer: Self-pay | Admitting: Podiatry

## 2021-08-14 ENCOUNTER — Inpatient Hospital Stay (HOSPITAL_COMMUNITY): Payer: 59

## 2021-08-14 DIAGNOSIS — R7881 Bacteremia: Secondary | ICD-10-CM | POA: Diagnosis not present

## 2021-08-14 DIAGNOSIS — M869 Osteomyelitis, unspecified: Secondary | ICD-10-CM | POA: Diagnosis not present

## 2021-08-14 DIAGNOSIS — L03116 Cellulitis of left lower limb: Secondary | ICD-10-CM | POA: Diagnosis not present

## 2021-08-14 LAB — CBC WITH DIFFERENTIAL/PLATELET
Abs Immature Granulocytes: 2.02 10*3/uL — ABNORMAL HIGH (ref 0.00–0.07)
Basophils Absolute: 0.1 10*3/uL (ref 0.0–0.1)
Basophils Relative: 0 %
Eosinophils Absolute: 0.2 10*3/uL (ref 0.0–0.5)
Eosinophils Relative: 1 %
HCT: 32.5 % — ABNORMAL LOW (ref 39.0–52.0)
Hemoglobin: 11.1 g/dL — ABNORMAL LOW (ref 13.0–17.0)
Immature Granulocytes: 7 %
Lymphocytes Relative: 7 %
Lymphs Abs: 2.1 10*3/uL (ref 0.7–4.0)
MCH: 29.1 pg (ref 26.0–34.0)
MCHC: 34.2 g/dL (ref 30.0–36.0)
MCV: 85.3 fL (ref 80.0–100.0)
Monocytes Absolute: 1.7 10*3/uL — ABNORMAL HIGH (ref 0.1–1.0)
Monocytes Relative: 6 %
Neutro Abs: 22.7 10*3/uL — ABNORMAL HIGH (ref 1.7–7.7)
Neutrophils Relative %: 79 %
Platelets: 336 10*3/uL (ref 150–400)
RBC: 3.81 MIL/uL — ABNORMAL LOW (ref 4.22–5.81)
RDW: 13.5 % (ref 11.5–15.5)
WBC: 28.7 10*3/uL — ABNORMAL HIGH (ref 4.0–10.5)
nRBC: 0 % (ref 0.0–0.2)

## 2021-08-14 LAB — COMPREHENSIVE METABOLIC PANEL
ALT: 45 U/L — ABNORMAL HIGH (ref 0–44)
AST: 34 U/L (ref 15–41)
Albumin: 2.2 g/dL — ABNORMAL LOW (ref 3.5–5.0)
Alkaline Phosphatase: 178 U/L — ABNORMAL HIGH (ref 38–126)
Anion gap: 8 (ref 5–15)
BUN: 39 mg/dL — ABNORMAL HIGH (ref 8–23)
CO2: 25 mmol/L (ref 22–32)
Calcium: 9.6 mg/dL (ref 8.9–10.3)
Chloride: 90 mmol/L — ABNORMAL LOW (ref 98–111)
Creatinine, Ser: 1.53 mg/dL — ABNORMAL HIGH (ref 0.61–1.24)
GFR, Estimated: 51 mL/min — ABNORMAL LOW (ref >60)
Glucose, Bld: 347 mg/dL — ABNORMAL HIGH (ref 70–99)
Potassium: 5.3 mmol/L — ABNORMAL HIGH (ref 3.5–5.1)
Sodium: 123 mmol/L — ABNORMAL LOW (ref 135–145)
Total Bilirubin: 1.2 mg/dL (ref 0.3–1.2)
Total Protein: 7 g/dL (ref 6.5–8.1)

## 2021-08-14 LAB — GLUCOSE, CAPILLARY
Glucose-Capillary: 232 mg/dL — ABNORMAL HIGH (ref 70–99)
Glucose-Capillary: 284 mg/dL — ABNORMAL HIGH (ref 70–99)
Glucose-Capillary: 193 mg/dL — ABNORMAL HIGH (ref 70–99)
Glucose-Capillary: 171 mg/dL — ABNORMAL HIGH (ref 70–99)

## 2021-08-14 IMAGING — US US RENAL
1 series · 15 of 25 positions shown · non-contrast
Comparison: [DATE]

CLINICAL DATA: AK I

EXAM:
RENAL / URINARY TRACT ULTRASOUND COMPLETE

[Series 1: us renal mc & wl · 15 of 47 slices shown]
[im 1/47]
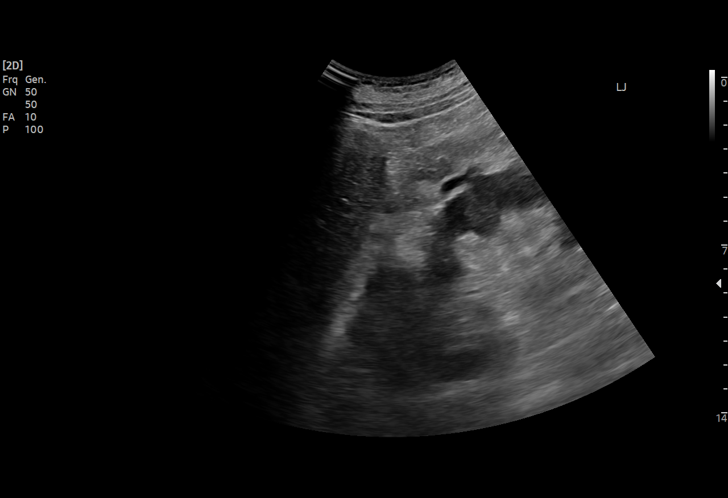
[im 4/47]
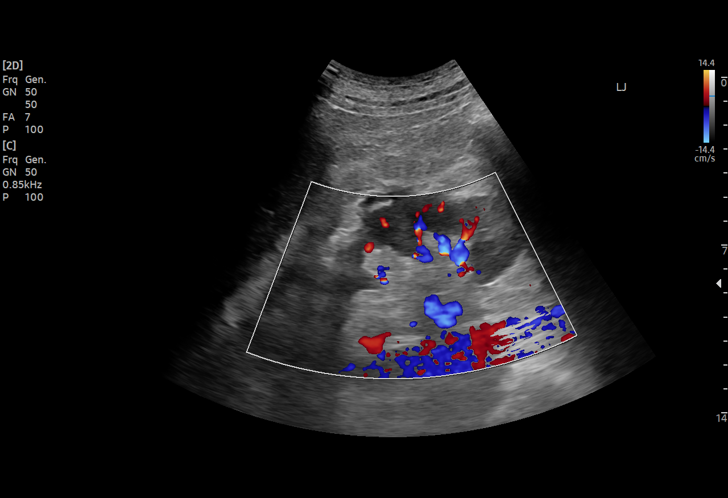
[im 8/47]
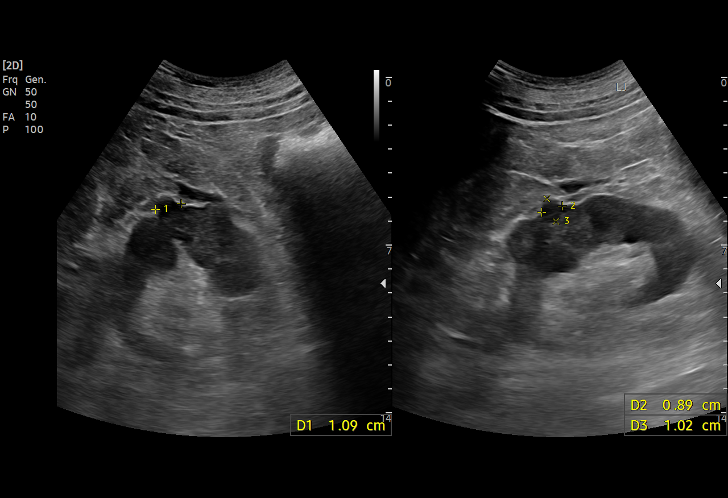
[im 10/47]
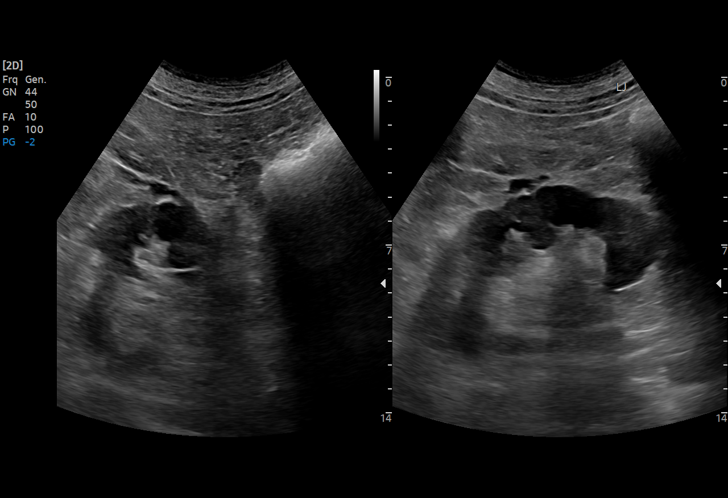
[im 14/47]
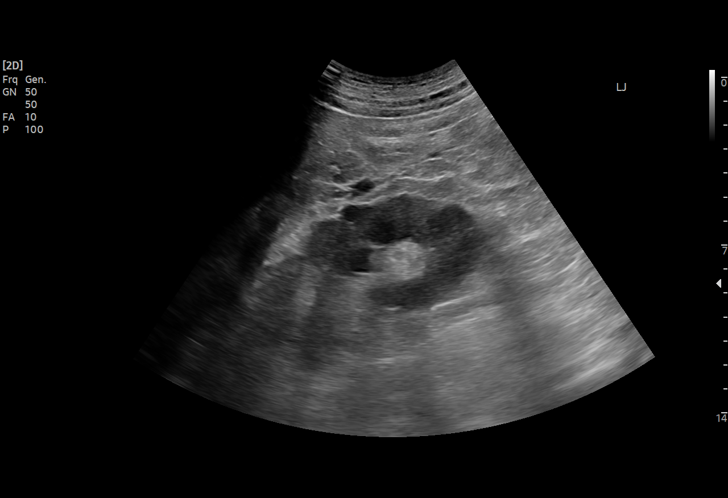
[im 18/47]
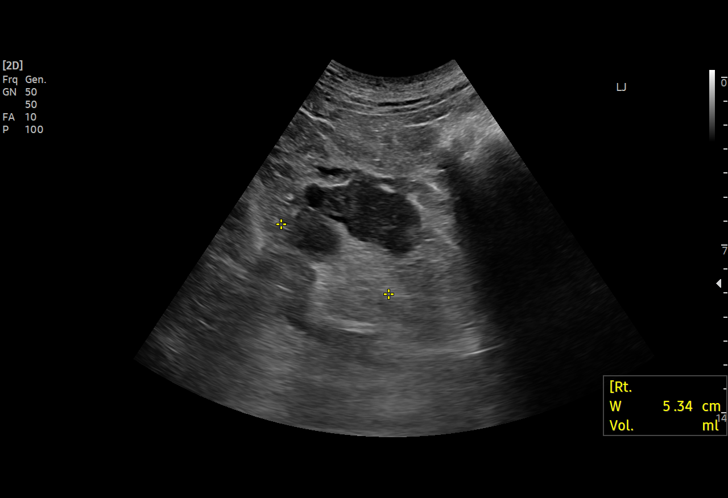
[im 20/47]
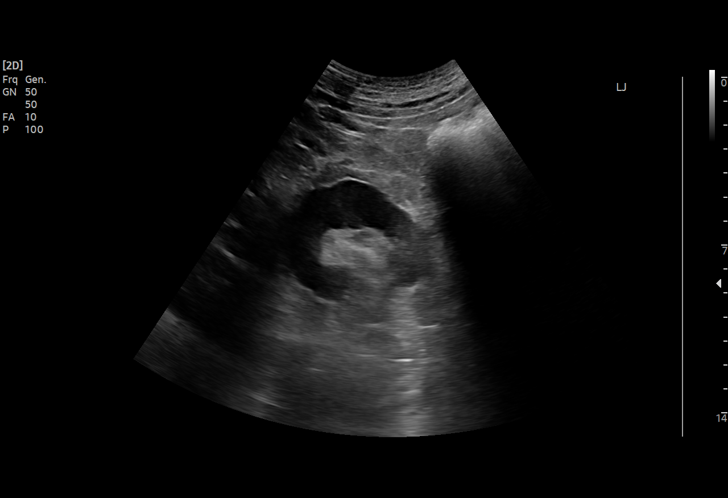
[im 24/47]
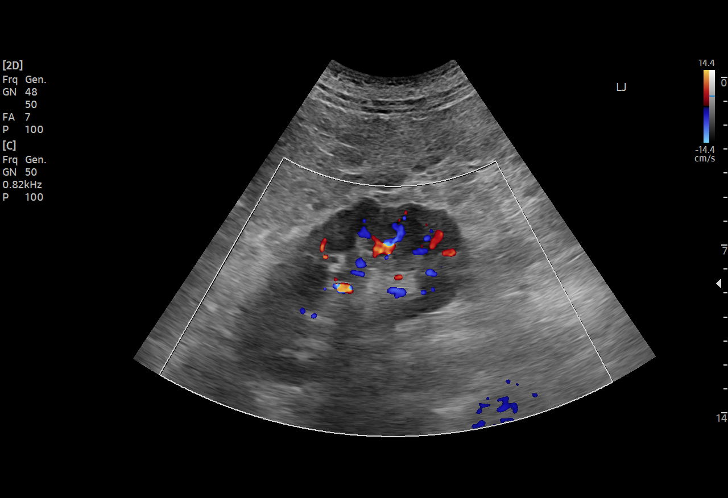
[im 27/47]
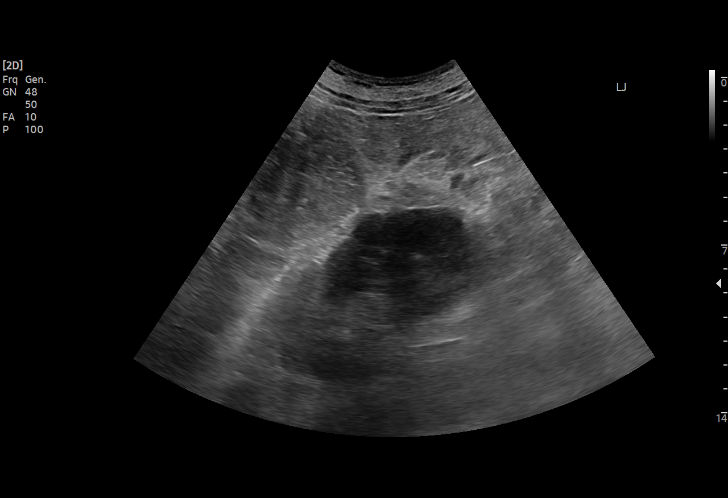
[im 29/47]
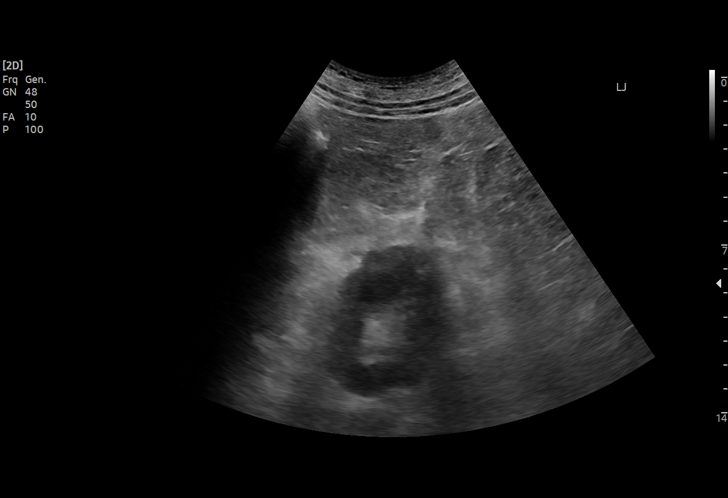
[im 33/47]
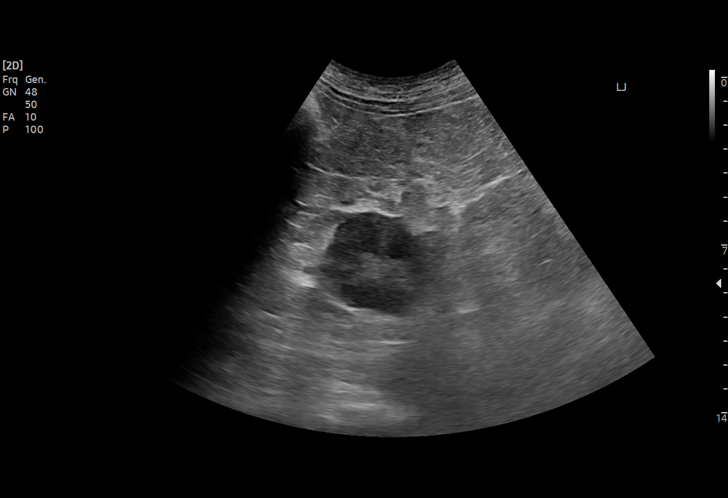
[im 37/47]
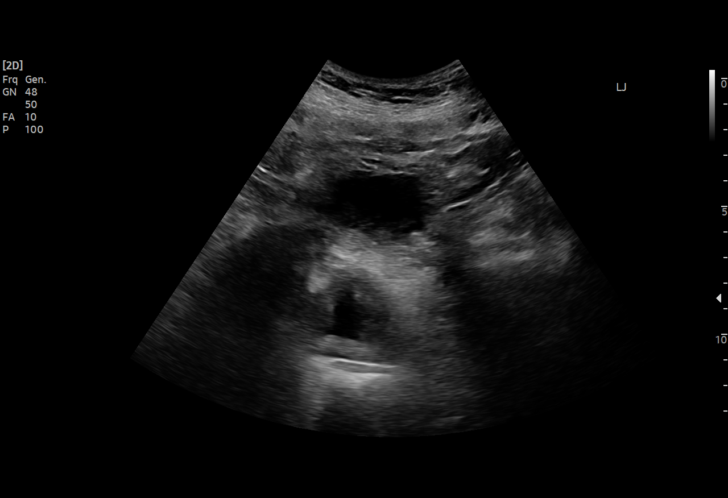
[im 39/47]
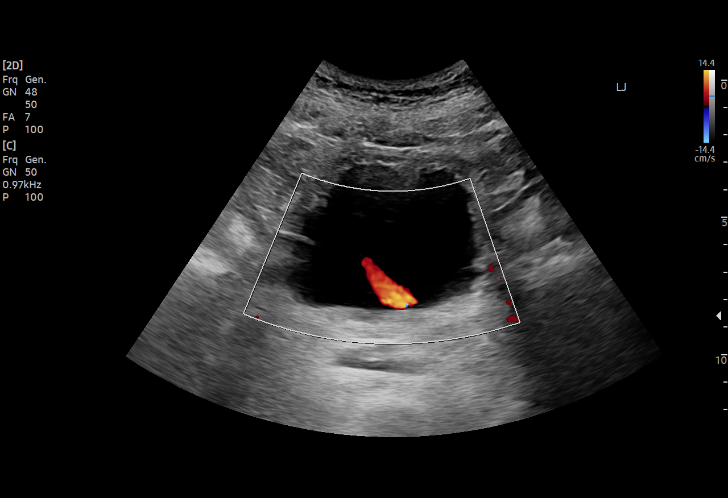
[im 43/47]
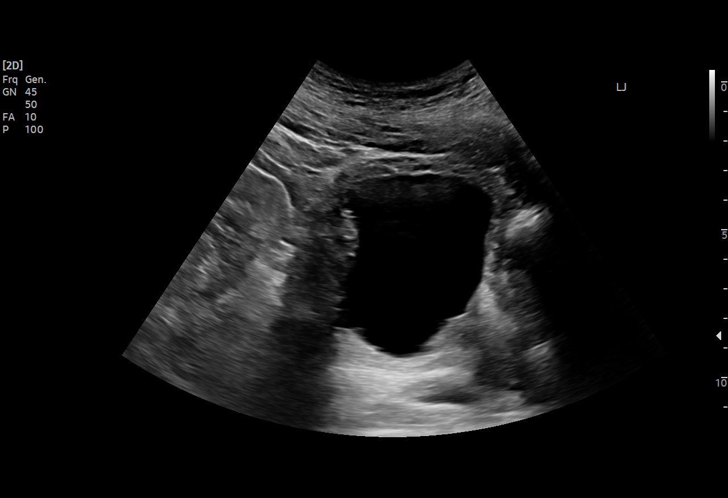
[im 47/47]
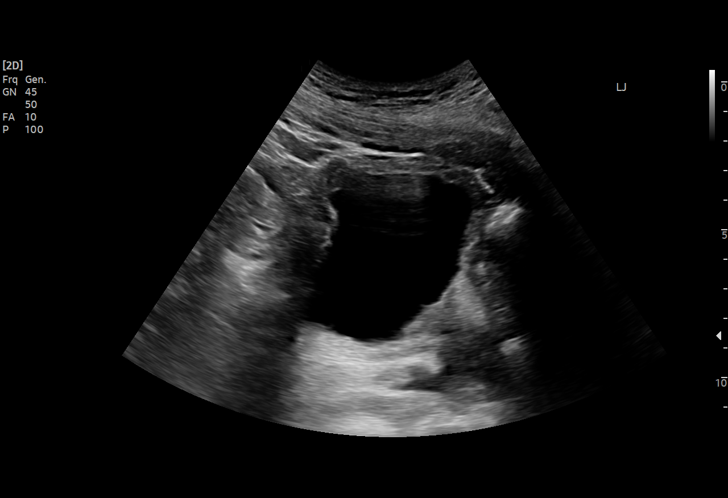

[15 of 25 positions shown; findings below may reference images not displayed]

FINDINGS: Right Kidney:

Renal measurements: 11.0 x 5.8 x 5.3 cm = volume: 178 mL.
Echogenicity within normal limits. No hydronephrosis. Kidney is
lobular in appearance. There is a possible exophytic hypoechoic mass
which measures 10 x 9 x 11 mm. There is an additional hypoechoic
mass with suggestion of posterior acoustic enhancement in the lower
pole which measures 15 x 24 x 18 mm.

Left Kidney:

Renal measurements: 10.9 x 5.5 x 4.8 cm = volume: 151 mL.
Echogenicity within normal limits. No mass or hydronephrosis
visualized. Kidney is lobular in appearance.

Bladder:

Appears normal for degree of bladder distention.

Other:

Small amount of free fluid adjacent to the RIGHT kidney.
IMPRESSION: 1. No hydronephrosis.
2. Indeterminate RIGHT renal masses, likely complicated cysts.
Recommend follow-up evaluation with contrast-enhanced renal cyst
protocol CT or MRI when clinically appropriate.
3. Small volume free fluid adjacent to the RIGHT kidney,
nonspecific.

## 2021-08-14 MED ORDER — SODIUM CHLORIDE 0.9 % IV SOLN: INTRAVENOUS | Status: DC

## 2021-08-14 MED ORDER — INSULIN GLARGINE-YFGN 100 UNIT/ML ~~LOC~~ SOLN
15.0000 [IU] | Freq: Two times a day (BID) | SUBCUTANEOUS | Status: DC
  Administered 2021-08-14 – 2021-08-20 (×12): 15 [IU] via SUBCUTANEOUS
  Filled 2021-08-14 (×17): qty 0.15

## 2021-08-14 MED ORDER — SODIUM ZIRCONIUM CYCLOSILICATE 10 G PO PACK: 10.0000 g | PACK | Freq: Two times a day (BID) | ORAL | Status: DC

## 2021-08-14 MED ORDER — INSULIN ASPART 100 UNIT/ML IJ SOLN
8.0000 [IU] | Freq: Three times a day (TID) | INTRAMUSCULAR | Status: DC
  Administered 2021-08-14 – 2021-08-20 (×19): 8 [IU] via SUBCUTANEOUS

## 2021-08-14 MED ORDER — SODIUM ZIRCONIUM CYCLOSILICATE 10 G PO PACK
10.0000 g | PACK | Freq: Two times a day (BID) | ORAL | Status: AC
  Administered 2021-08-14 (×2): 10 g via ORAL
  Filled 2021-08-14 (×2): qty 1

## 2021-08-14 NOTE — Evaluation (Signed)
Physical Therapy Evaluation Patient Details Name: [name] [name] MRN: [id number] DOB: [birth year] Today's Date: 08/14/2021  History of Present Illness  Pt s/p R great toe amputation 08/09/21 and R foot I&D 08/13/21.  Pt with hx of CAD, CABG, DM and "bad L knee"  - pt states he can not rely on L knee to support him.  Clinical Impression    Pt admitted as above and presenting with functional mobility limitations 2* NWB R LE, limited strength L LE, and premorbid deconditioning.  Pt hopes to progress to dc home with assist of spouse but will likely do so at [facility] level.  Pt states he has utilized wc in home previously and will have spouse look into arranging for a ramp.      Recommendations for follow up therapy are one component of a multi-disciplinary discharge planning process, led by the attending physician.  Recommendations may be updated based on patient status, additional functional criteria and insurance authorization.  Follow Up Recommendations Home health PT    Equipment Recommendations  Rolling walker with 5" wheels;Wheelchair (measurements PT);Wheelchair cushion (measurements PT)    Recommendations for Other Services OT consult     Precautions / Restrictions Precautions Precautions: Fall Required Braces or Orthoses: Other Brace Other Brace: Darko shoe for R foot in orders but not in room on eval date Restrictions Weight Bearing Restrictions: Yes RLE Weight Bearing: Non weight bearing      Mobility  Bed Mobility Overal bed mobility: Needs Assistance Bed Mobility: Supine to Sit     Supine to sit: Min guard     General bed mobility comments: Increased time with use of bed rail and pt self assisting L LE with hands    Transfers Overall transfer level: Needs assistance Equipment used:  (drop arm recliner) Transfers: Lateral/Scoot Transfers          Lateral/Scoot Transfers: Min assist;+2 safety/equipment General transfer comment: Standing NT - pt is NWB on R foot,  Darko shoe not present, limited support from weak L knee.  Lateral scoot bed to drop arm recliner with very min assist and second person to block chair to prevent movement.  Ambulation/Gait                Stairs            Wheelchair Mobility    Modified Rankin (Stroke Patients Only)       Balance Overall balance assessment: Needs assistance Sitting-balance support: No upper extremity supported;Feet supported Sitting balance-Leahy Scale: Good                                       Pertinent Vitals/Pain Pain Assessment: 0-10 Pain Score: 4  Pain Location: R foot Pain Descriptors / Indicators: Aching Pain Intervention(s): Limited activity within patient's tolerance;Monitored during session;Premedicated before session    Home Living Family/patient expects to be discharged to:: Private residence Living Arrangements: Spouse/significant other Available Help at Discharge: Family;Available 24 hours/day Type of Home: House Home Access: Stairs to enter   [employer] of Steps: 2 Home Layout: One level Home Equipment: Walker - 4 wheels;Cane - single point      Prior Function Level of Independence: Independent with assistive device(s)         Comments: using cane     Hand Dominance        Extremity/Trunk Assessment   Upper Extremity Assessment Upper Extremity Assessment: Overall [facility]  for tasks assessed    Lower Extremity Assessment Lower Extremity Assessment: RLE deficits/detail;LLE deficits/detail RLE Deficits / Details: NWB with post op dressings and drain in place R foot/ankle LLE Deficits / Details: L knee ext 3+/5 pain limited    Cervical / Trunk Assessment Cervical / Trunk Assessment: Normal  Communication   Communication: No difficulties  Cognition Arousal/Alertness: Awake/alert Behavior During Therapy: WFL for tasks assessed/performed Overall Cognitive Status: Within Functional Limits for tasks assessed                                         General Comments      Exercises     Assessment/Plan    PT Assessment Patient needs continued PT services  PT Problem List Decreased strength;Decreased activity tolerance;Decreased balance;Decreased range of motion;Decreased mobility;Decreased knowledge of use of DME;Pain       PT Treatment Interventions DME instruction;Functional mobility training;Gait training;Therapeutic activities;Therapeutic exercise;Balance training;Patient/family education;Wheelchair mobility training    PT Goals (Current goals can be found in the Care Plan section)  Acute Rehab PT Goals Patient Stated Goal: HOME with spouse PT Goal Formulation: With patient Time For Goal Achievement: 08/28/21 Potential to Achieve Goals: Good    Frequency Min 3X/week   Barriers to discharge Inaccessible home environment 2 steps into house, pt states wife can arrange for ramp prior to return home?    Co-evaluation               AM-PAC PT "6 Clicks" Mobility  Outcome Measure Help needed turning from your back to your side while in a flat bed without using bedrails?: None Help needed moving from lying on your back to sitting on the side of a flat bed without using bedrails?: A Little Help needed moving to and from a bed to a chair (including a wheelchair)?: A Little Help needed standing up from a chair using your arms (e.g., wheelchair or bedside chair)?: A Lot Help needed to walk in hospital room?: Total Help needed climbing 3-5 steps with a railing? : Total 6 Click Score: 14    End of Session Equipment Utilized During Treatment: Gait belt Activity Tolerance: Patient limited by fatigue Patient left: in chair;with call bell/phone within reach;with chair alarm set Nurse Communication: Mobility status PT Visit Diagnosis: Muscle weakness (generalized) (M62.81);Difficulty in walking, not elsewhere classified (R26.2);Pain Pain - Right/Left: Right Pain - part of body:  Ankle and joints of foot    Time: 1335-1402 PT Time Calculation (min) (ACUTE ONLY): 27 min   Charges:   PT Evaluation $PT Eval Low Complexity: 1 Low PT Treatments $Therapeutic Activity: 8-22 mins        Mauro Kaufmann PT Acute Rehabilitation Services Pager [phone] Office [phone]   [facility] 08/14/2021, 5:39 PM

## 2021-08-14 NOTE — Progress Notes (Signed)
Patient does not want to wear CPAP while here at the hospital 

## 2021-08-14 NOTE — Progress Notes (Signed)
PROGRESS NOTE    [name] [name]  QHU:[id number] DOB: January 24, 1956 DOA: 08/08/2021 PCP: Tally Joe, MD    Chief Complaint  Patient presents with   Wound Infection    Brief Narrative:  65 year old gentleman prior history of hypertension, type 2 diabetes, hyperlipidemia presents to the hospital with left lower extremity cellulitis as well as right great toe nonhealing wound with pain and tenderness.  Patient reports that over the last 1 week prior to admission patient had worsening erythema pain and foul-smelling discharge from the right toe.  He follows up with wound care.  Podiatry and ID were consulted and patient underwent partial first ray amputation of the right foot, partial second toe amputation and debridement of the deep abscess in the right foot by Dr. Loreta Ave on 08/09/2021.  ID on board, appreciate input.   Assessment & Plan:   Active Problems:   Toe infection   Cellulitis of right lower extremity   Osteomyelitis of great toe of right foot (HCC)   Osteomyelitis of second toe of right foot (HCC)   Diabetes mellitus due to underlying condition with diabetic autonomic neuropathy, without long-term current use of insulin (HCC)   Class 3 severe obesity with serious comorbidity and body mass index (BMI) of 45.0 to 49.9 in adult [facility])   Elevated LFTs   Post-operative state   Cellulitis of left lower extremity   Streptococcal bacteremia   Right foot ulcer, MRI on admission of the right foot was concerning for septic arthritis and osteomyelitis of the first proximal and distal phalanges as well as osteomyelitis at the second distal phalanx. It also showed infectious tenosynovitis around the first metatarsal and multiple forefoot abscesses.  Podiatry consulted and he underwent partial first ray amputation, partial second toe amputation and debridement of the deep abscess of the right foot by Dr. Loreta Ave on 08/09/2021.  ID on board and appreciate recommendations. He was started on IV  rocephin, transitioned to IV penicillin and flagyl.  MRI of the left foot does not show any osteomyelitis. Leukocytosis continues to worsen.  Cultures from the right foot shows abundant gram positive cocci in pairs and clusters, possibly streptococcus agalactiae. In view of worsening leukocytosis, He underwent repeat debridement of the right foot.  Pt remains afebrile but wbc is slowly improving.  Therapy evaluations ordered.    Streptococcus intermedius bacteremia: from cultures drawn on 08/08/21 Possibly from diabetic foot infection.  Echocardiogram does not show any evidence of valvular vegetations.  Blood cultures are repeated. Negative so far,  PICC line to be placed.  ID recommended 4 weeks of IV penicillin and flagyl.    Left lower extremity cellulitis Improved.     Elevated liver enzymes Same patient currently denies any nausea and vomiting, continue to monitor.  Right upper quadrant ultrasound showed cholelithiasis.  Patient is currently asymptomatic.   Stage IIIa CKD Baseline creatinine around 1.5-1.7 over the last 7 years Creatinine of 1.53.     Type 2 diabetes mellitus with hyperglycemia and with CKD Continue with sliding scale insulin. CBG (last 3)  Recent Labs    08/13/21 2058 08/14/21 0749 08/14/21 1144  GLUCAP 243* 232* 284*   Last hemoglobin A1c around 6.2 Patient is on Ozempic at home which is on hold.  Increase in Semglee 15 units BID, increase novolog 8 units TIDAC.   Hyperlipidemia Statin on hold due to elevated liver enzymes.   Hyponatremia:  Continues to worsen.  Possibly from IV lasix. Vs Pseudohyponatremia from hyperglycemia.  TSH, cortisol, urine sodium  and serum osmo ordered for further eval.  Serum osmo is 288. Cortisol is 7.1, urine sodium is 63, TSH is wnl.   Hypertension:  Well controlled.  Will add hydralazine prn to the amlodipine, avapro and metoprolol   Hyperkalemia:  Lokelma ordered.  Recheck labs in am.       DVT  prophylaxis: (Lovenox) Code Status: (Full code) Family Communication: (None at bedside).  Disposition:   Status is: Inpatient  Remains inpatient appropriate because:Ongoing diagnostic testing needed not appropriate for outpatient work up and IV treatments appropriate due to intensity of illness or inability to take PO  Dispo: The patient is from: Home              Anticipated d/c is to: Home              Patient currently is not medically stable to d/c.   Difficult to place patient No       Consultants:  Podiatry  ID.   Procedures:  Partial 1st ray amputation right foot; partial 2nd toe amputation, I&D deep abscess right foot.  by dr Ardelle Anton on 08/09/21  Incision and drainage of the right foot wound on 08/13/21.  Antimicrobials:  Antibiotics Given (last 72 hours)     Date/Time Action Medication Dose Rate   08/12/21 0837 New Bag/Given   cefTRIAXone (ROCEPHIN) 2 g in sodium chloride 0.9 % 100 mL IVPB 2 g 200 mL/hr   08/12/21 1444 Given   metroNIDAZOLE (FLAGYL) tablet 500 mg 500 mg    08/12/21 2151 Given   metroNIDAZOLE (FLAGYL) tablet 500 mg 500 mg    08/13/21 0841 Given   vancomycin (VANCOCIN) powder 1,000 mg    08/13/21 1256 Given  [Patient at surgery, too drowsy to swallow pills when back on unit]   metroNIDAZOLE (FLAGYL) tablet 500 mg 500 mg    08/13/21 1309 New Bag/Given   penicillin G potassium 9 Million Units in dextrose 5 % 500 mL continuous infusion 9 Million Units 41.7 mL/hr   08/13/21 2135 Given   metroNIDAZOLE (FLAGYL) tablet 500 mg 500 mg    08/14/21 0011 New Bag/Given   penicillin G potassium 9 Million Units in dextrose 5 % 500 mL continuous infusion 9 Million Units 41.7 mL/hr   08/14/21 1011 Given   metroNIDAZOLE (FLAGYL) tablet 500 mg 500 mg    08/14/21 1150 New Bag/Given   penicillin G potassium 9 Million Units in dextrose 5 % 500 mL continuous infusion 9 Million Units 41.7 mL/hr         Subjective: No new complaints.   Objective: Vitals:    08/13/21 1248 08/13/21 2053 08/14/21 0543 08/14/21 1432  BP: 140/71 (!) 144/72 136/71 111/75  Pulse: 75 81 72 60  Resp: Temp: 98 F (36.7 C) 98 F (36.7 C) 98.8 F (37.1 C)   TempSrc: Oral Oral Oral   SpO2: 100% 93% 95% 98%  Weight:      Height:        Intake/Output Summary (Last 24 hours) at 08/14/2021 1537 Last data filed at 08/14/2021 1303 Gross per 24 hour  Intake 2013.61 ml  Output 2800 ml  Net -786.39 ml    Filed Weights   08/08/21 1149 08/08/21 1738 08/09/21 1625  Weight: 112.9 kg 118.6 kg 118.6 kg    Examination:  General exam: Appears calm and comfortable  Respiratory system: Clear to auscultation. Respiratory effort normal. Cardiovascular system: S1 & S2 heard, RRR. No JVD,  No pedal edema. Gastrointestinal  system: Abdomen is nondistended, soft and nontender.  Normal bowel sounds heard. Central nervous system: Alert and oriented. No focal neurological deficits. Extremities: right foot bandaged.  Skin: No rashes, lesions or ulcers Psychiatry: Mood & affect appropriate.       Data Reviewed: I have personally reviewed following labs and imaging studies  CBC: Recent Labs  Lab 08/08/21 1307 08/08/21 1807 08/09/21 0546 08/10/21 0503 08/11/21 0846 08/12/21 0948 08/14/21 1303  WBC 19.2*   < > 20.2* 24.8* 25.2* 29.1* 28.7*  NEUTROABS 17.0*  --   --   --  20.7* 22.1* 22.7*  HGB 13.4   < > 12.5* 12.1* 12.1* 12.3* 11.1*  HCT 40.3   < > 36.9* 35.6* 35.6* 35.5* 32.5*  MCV 87.6   < > 85.2 86.6 84.8 84.1 85.3  PLT 295   < > 255 271 245 287 336   < > = values in this interval not displayed.     Basic Metabolic Panel: Recent Labs  Lab 08/09/21 0546 08/10/21 0503 08/11/21 0846 08/12/21 0459 08/14/21 1303  NA 135 134* 126* 127* 123*  K 3.5 3.7 4.5 5.0 5.3*  CL 99 97* 90* 94* 90*  CO2 25 28 25 24 25   GLUCOSE 195* 191* 347* 221* 347*  BUN 27* 30* 27* 32* 39*  CREATININE 1.21 1.25* 1.17 1.26* 1.53*  CALCIUM 7.5* 8.3* 8.9 9.5 9.6      GFR: Estimated Creatinine Clearance: 65.7 mL/min (A) (by C-G formula based on SCr of 1.53 mg/dL (H)).  Liver Function Tests: Recent Labs  Lab 08/08/21 1307 08/09/21 0546 08/14/21 1303  AST 63* 29 34  ALT 88* 64* 45*  ALKPHOS 82 81 178*  BILITOT 1.1 1.2 1.2  PROT 7.3 6.4* 7.0  ALBUMIN 3.2* 2.7* 2.2*     CBG: Recent Labs  Lab 08/13/21 1033 08/13/21 1808 08/13/21 2058 08/14/21 0749 08/14/21 1144  GLUCAP 239* 194* 243* 232* 284*      Recent Results (from the past 240 hour(s))  Blood Cultures x 2 sites     Status: Abnormal   Collection Time: 08/08/21  1:07 PM   Specimen: BLOOD  Result Value Ref Range Status   Specimen Description   Final    BLOOD RIGHT ANTECUBITAL Performed at [facility], [address], [place], [place]    Special Requests   Final    BOTTLES DRAWN AEROBIC AND ANAEROBIC Blood Culture results may not be optimal due to an excessive volume of blood received in culture bottles Performed at [facility], [address], [place], [place]    Culture  Setup Time   Final    GRAM POSITIVE COCCI IN BOTH AEROBIC AND ANAEROBIC BOTTLES CRITICAL RESULT CALLED TO, READ BACK BY AND VERIFIED WITH: [id number] PHARMD Janine Limbo 08/09/21 A BROWNING Performed at [facility], [address] [place], [place]    Culture STREPTOCOCCUS INTERMEDIUS (A)  Final   Report Status 08/13/2021 FINAL  Final   Organism ID, Bacteria STREPTOCOCCUS INTERMEDIUS  Final      Susceptibility   Streptococcus intermedius - MIC*    PENICILLIN <=0.06 SENSITIVE Sensitive     CEFTRIAXONE <=0.12 SENSITIVE Sensitive     ERYTHROMYCIN >=8 RESISTANT Resistant     LEVOFLOXACIN <=0.25 SENSITIVE Sensitive     VANCOMYCIN 0.25 SENSITIVE Sensitive     * STREPTOCOCCUS INTERMEDIUS  Blood Cultures x 2 sites     Status: Abnormal   Collection Time: 08/08/21  1:07  PM   Specimen: BLOOD  Result Value Ref Range Status   Specimen  Description   Final    BLOOD BLOOD RIGHT FOREARM Performed at [facility], [address], [place], [place] [address]    Special Requests   Final    BOTTLES DRAWN AEROBIC AND ANAEROBIC Blood Culture results may not be optimal due to an excessive volume of blood received in culture bottles Performed at [facility], [address] [place], [place] [address]    Culture  Setup Time   Final    GRAM POSITIVE COCCI ANAEROBIC BOTTLE ONLY CRITICAL VALUE NOTED.  VALUE IS CONSISTENT WITH PREVIOUSLY REPORTED AND CALLED VALUE.    Culture (A)  Final    STREPTOCOCCUS INTERMEDIUS SUSCEPTIBILITIES PERFORMED ON PREVIOUS CULTURE WITHIN THE LAST 5 DAYS. Performed at [facility], [address] [place], [place] [address]    Report Status 08/12/2021 FINAL  Final  Blood Culture ID Panel (Reflexed)     Status: Abnormal   Collection Time: 08/08/21  1:07 PM  Result Value Ref Range Status   Enterococcus faecalis NOT DETECTED NOT DETECTED Final   Enterococcus Faecium NOT DETECTED NOT DETECTED Final   Listeria monocytogenes NOT DETECTED NOT DETECTED Final   Staphylococcus species NOT DETECTED NOT DETECTED Final   Staphylococcus aureus (BCID) NOT DETECTED NOT DETECTED Final   Staphylococcus epidermidis NOT DETECTED NOT DETECTED Final   Staphylococcus lugdunensis NOT DETECTED NOT DETECTED Final   Streptococcus species DETECTED (A) NOT DETECTED Final    Comment: Not Enterococcus species, Streptococcus agalactiae, Streptococcus pyogenes, or Streptococcus pneumoniae. CRITICAL RESULT CALLED TO, READ BACK BY AND VERIFIED WITH: Janine Limbo PHARMD [id number] 08/09/21 A BROWNING    Streptococcus agalactiae NOT DETECTED NOT DETECTED Final   Streptococcus pneumoniae NOT DETECTED NOT DETECTED Final   Streptococcus pyogenes NOT DETECTED NOT DETECTED Final   A.calcoaceticus-baumannii NOT DETECTED NOT DETECTED Final   Bacteroides fragilis NOT DETECTED NOT DETECTED Final    Enterobacterales NOT DETECTED NOT DETECTED Final   Enterobacter cloacae complex NOT DETECTED NOT DETECTED Final   Escherichia coli NOT DETECTED NOT DETECTED Final   Klebsiella aerogenes NOT DETECTED NOT DETECTED Final   Klebsiella oxytoca NOT DETECTED NOT DETECTED Final   Klebsiella pneumoniae NOT DETECTED NOT DETECTED Final   Proteus species NOT DETECTED NOT DETECTED Final   Salmonella species NOT DETECTED NOT DETECTED Final   Serratia marcescens NOT DETECTED NOT DETECTED Final   Haemophilus influenzae NOT DETECTED NOT DETECTED Final   Neisseria meningitidis NOT DETECTED NOT DETECTED Final   Pseudomonas aeruginosa NOT DETECTED NOT DETECTED Final   Stenotrophomonas maltophilia NOT DETECTED NOT DETECTED Final   Candida albicans NOT DETECTED NOT DETECTED Final   Candida auris NOT DETECTED NOT DETECTED Final   Candida glabrata NOT DETECTED NOT DETECTED Final   Candida krusei NOT DETECTED NOT DETECTED Final   Candida parapsilosis NOT DETECTED NOT DETECTED Final   Candida tropicalis NOT DETECTED NOT DETECTED Final   Cryptococcus neoformans/gattii NOT DETECTED NOT DETECTED Final    Comment: Performed at [facility], [address] [place], [place] [address]  Resp Panel by RT-PCR (Flu A&B, Covid) Nasopharyngeal Swab     Status: None   Collection Time: 08/08/21  3:00 PM   Specimen: Nasopharyngeal Swab; Nasopharyngeal(NP) swabs in vial transport medium  Result Value Ref Range Status   SARS Coronavirus 2 by RT PCR NEGATIVE NEGATIVE Final    Comment: (NOTE) SARS-CoV-2 target nucleic acids are NOT DETECTED.  The SARS-CoV-2 RNA is generally detectable in upper respiratory specimens during the acute phase of infection. The lowest concentration of SARS-CoV-2 viral copies this assay can detect is 138 copies/mL. A negative result does not preclude SARS-Cov-2 infection and should not be used as the sole basis for treatment or other patient management decisions. A negative result may occur  with  improper specimen collection/handling, submission of specimen other than nasopharyngeal swab, presence of viral mutation(s) within the areas targeted by this assay, and inadequate number of viral copies(<138 copies/mL). A negative result must be combined with clinical observations, patient history, and epidemiological information. The expected result is Negative.  Fact Sheet for Patients:  BloggerCourse.com  Fact Sheet for Healthcare Providers:  SeriousBroker.it  This test is no t yet approved or cleared by the [place] FDA and  has been authorized for detection and/or diagnosis of SARS-CoV-2 by FDA under an Emergency Use Authorization (EUA). This EUA will remain  in effect (meaning this test can be used) for the duration of the COVID-19 declaration under Section 564(b)(1) of the Act, 21 U.S.C.section 360bbb-3(b)(1), unless the authorization is terminated  or revoked sooner.       Influenza A by PCR NEGATIVE NEGATIVE Final   Influenza B by PCR NEGATIVE NEGATIVE Final    Comment: (NOTE) The Xpert Xpress SARS-CoV-2/FLU/RSV plus assay is intended as an aid in the diagnosis of influenza from Nasopharyngeal swab specimens and should not be used as a sole basis for treatment. Nasal washings and aspirates are unacceptable for Xpert Xpress SARS-CoV-2/FLU/RSV testing.  Fact Sheet for Patients: BloggerCourse.com  Fact Sheet for Healthcare Providers: SeriousBroker.it  This test is not yet approved or cleared by the [place] FDA and has been authorized for detection and/or diagnosis of SARS-CoV-2 by FDA under an Emergency Use Authorization (EUA). This EUA will remain in effect (meaning this test can be used) for the duration of the COVID-19 declaration under Section 564(b)(1) of the Act, 21 U.S.C. section 360bbb-3(b)(1), unless the authorization is terminated  or revoked.  Performed at [facility], [address] [place], [place] [address]   Aerobic/Anaerobic Culture w Gram Stain (surgical/deep wound)     Status: None (Preliminary result)   Collection Time: 08/09/21  6:18 PM   Specimen: Abscess  Result Value Ref Range Status   Specimen Description   Final    ABSCESS RIGHT BIG TOE Performed at [facility], [address], [place], [place] [address]    Special Requests   Final    NONE Performed at [facility], [address], [place], [place] [address]    Gram Stain   Final    RARE WBC PRESENT, PREDOMINANTLY MONONUCLEAR ABUNDANT GRAM POSITIVE COCCI IN PAIRS ABUNDANT GRAM POSITIVE COCCI IN CLUSTERS    Culture   Final    FEW STREPTOCOCCUS AGALACTIAE TESTING AGAINST S. AGALACTIAE NOT ROUTINELY PERFORMED DUE TO PREDICTABILITY OF AMP/PEN/VAN SUSCEPTIBILITY. MODERATE STREPTOCOCCUS INTERMEDIUS FEW PREVOTELLA BIVIA BETA LACTAMASE POSITIVE Performed at [facility], [address] [place], [place] [address]    Report Status PENDING  Incomplete   Organism ID, Bacteria STREPTOCOCCUS INTERMEDIUS  Final      Susceptibility   Streptococcus intermedius - MIC*    PENICILLIN <=0.06 SENSITIVE Sensitive     CEFTRIAXONE <=0.12 SENSITIVE Sensitive     ERYTHROMYCIN >=8 RESISTANT Resistant     LEVOFLOXACIN <=0.25 SENSITIVE Sensitive     VANCOMYCIN 0.25 SENSITIVE Sensitive     * MODERATE STREPTOCOCCUS INTERMEDIUS  Culture, blood (Routine X 2) w Reflex to ID Panel     Status: None (Preliminary result)   Collection Time: 08/11/21 10:37 AM   Specimen: BLOOD  Result Value Ref Range Status   Specimen Description   Final    BLOOD BLOOD RIGHT FOREARM Performed at [facility], [address], [place], [place] [address]    Special Requests   Final    BOTTLES DRAWN AEROBIC AND ANAEROBIC Blood Culture adequate volume Performed at [facility], [address] [place], [place] [address]    Culture   Final    NO GROWTH 3 DAYS Performed at [facility], [address], [place], [place] [address]    Report Status PENDING  Incomplete  Culture, blood (Routine X 2) w Reflex to ID Panel     Status: None (Preliminary result)   Collection Time: 08/11/21 10:41 AM   Specimen: BLOOD  Result Value Ref Range Status   Specimen Description   Final    BLOOD BLOOD LEFT FOREARM Performed at [facility], [address], [place], [place] [address]    Special Requests   Final    BOTTLES DRAWN AEROBIC AND ANAEROBIC Blood Culture results may not be optimal due to an excessive volume of blood received in culture bottles Performed at [facility], [address] [place], [place] [address]    Culture   Final    NO GROWTH 3 DAYS Performed at [facility], [address] [place], [place] [address]    Report Status PENDING  Incomplete          Radiology Studies: [place] EKG SITE RITE  Result Date: 08/12/2021 If Site Rite image not attached, placement could not be confirmed due to current cardiac rhythm.       Scheduled Meds:  amLODipine  10 mg Oral QHS   aspirin  325 mg Oral Daily   buPROPion  300 mg Oral Daily   calcium carbonate  400 mg of elemental calcium Oral BID   Chlorhexidine Gluconate Cloth  6 each Topical Daily   enoxaparin (LOVENOX) injection  60 mg Subcutaneous Q24H   insulin aspart  0-15 Units Subcutaneous TID WC   insulin aspart  0-5 Units Subcutaneous QHS   insulin aspart  8 Units Subcutaneous TID WC   insulin glargine-yfgn  15 Units Subcutaneous BID   irbesartan  300 mg Oral Daily   metoprolol tartrate  100 mg Oral BID   metroNIDAZOLE  500 mg Oral Q12H   omega-3 acid ethyl esters  1 g Oral Daily   pantoprazole  40 mg Oral Daily   senna-docusate  2 tablet Oral BID   sodium chloride flush  10-40 mL Intracatheter Q12H   sodium zirconium cyclosilicate  10 g Oral BID   Vitamin  D (Ergocalciferol)  50,000 Units Oral Q7 days   Continuous Infusions:  sodium chloride     penicillin g continuous IV infusion 9 Million Units (08/14/21 1150)     LOS: 6 days        Kathlen Mody, MD [facility]   To contact the attending provider between 7A-7P or the covering provider during after hours 7P-7A, please log into the web site [email or web address] and access using universal [place] password for that web site. If you do not have the password, please call the hospital operator.  08/14/2021, 3:37 PM

## 2021-08-14 NOTE — TOC Transition Note (Signed)
Transition of Care [facility]) - CM/SW Discharge Note   Patient Details  Name: MANISH RUGGIERO MRN: [id number] Date of Birth: [birth year]  Transition of Care [facility]) CM/SW Contact:  Darrick Greenlaw, Vinnie Langton, LCSW Phone Number: 08/14/2021, 1:43 PM   Clinical Narrative:     Home Health Nursing Services for administration of IV Antibiotics has been arranged through Pam with [facility] (# [phone]).  [facility] will provide HHRN.  LCSW placed request to Attending Physician, Dr. Kathlen Mody to sign OPAT orders.  Patient is not medically stable and ready for discharge today.    Barriers to Discharge: Continued Medical Work up   Patient Goals and CMS Choice   N/A  Discharge Placement   N/A  Discharge Plan and Services   Discharge Planning Services: CM Consult              HH Arranged: RN, IV Antibiotics HH Agency: [facility] ([facility]) Date HH Agency Contacted: 08/11/21   Representative spoke with at [facility] Agency: [facility]  Social Determinants of Health (SDOH) Interventions   N/A  Readmission Risk Interventions No flowsheet data found.  Danford Bad, BSW, MSW, [employer]  Licensed Visual merchandiser  [employer]  Mailing Address-[address], [place], [place] [address] Physical Address-[address] [place], [place] [address] Toll Free Main # [phone] Fax # [phone] Cell # [phone]  [email or web address]

## 2021-08-14 NOTE — Progress Notes (Signed)
Subjective: POD # 1 s/p return to the operating room for incision and drainage, debridement, washout of right foot infection.  He is doing well today not having significant pain.  Denies any fevers or chills.  Had a PICC line placed today.  No new concerns.  Objective: AAO x3, NAD-wife is at bedside DP/PT pulses palpable bilaterally, CRT less than 3 seconds Changed the dressing today.  There is mild edema to the foot but no overt cellulitis noted.  Upon inspection of the wound there is no purulence identified only a small amount of bloody drainage.  Drain intact with only bloody drainage.  There is no pain.  There is no fluctuance crepitation but there is no malodor. Ulceration the plantar aspect left hallux with granular base without any probing.  Erythema is almost completely resolved.  Erythema of the leg is also much improved.  There is no open lesions on the left leg.  Decreased pain of the left leg.  The erythema up to the leg has improved.   No pain with calf compression, swelling, warmth, erythema  Assessment: POD #1 s/p return to the OR for I&D, debridement right foot  Plan: No blood work to review today.  He is afebrile.  Dressing was changed today.  Overall foot is looking better today status post debridement, I&D from yesterday.  No purulence.  Dressing was reapplied today.  Encouraged elevation.  Darco wedge shoe ordered.  Out of bed to chair.  Weightbearing in Darco wedge shoe for short distances, transfers.  Continue IV antibiotics for now and continue to follow cultures and clinical response.  Recommend checking a CBC.  Ovid Curd, DPM

## 2021-08-15 ENCOUNTER — Inpatient Hospital Stay (HOSPITAL_COMMUNITY): Payer: 59

## 2021-08-15 DIAGNOSIS — M869 Osteomyelitis, unspecified: Secondary | ICD-10-CM | POA: Diagnosis not present

## 2021-08-15 DIAGNOSIS — L03116 Cellulitis of left lower limb: Secondary | ICD-10-CM | POA: Diagnosis not present

## 2021-08-15 DIAGNOSIS — R7881 Bacteremia: Secondary | ICD-10-CM | POA: Diagnosis not present

## 2021-08-15 LAB — C-REACTIVE PROTEIN: CRP: 18.4 mg/dL — ABNORMAL HIGH (ref <1.0)

## 2021-08-15 LAB — CBC WITH DIFFERENTIAL/PLATELET
Band Neutrophils: 0 %
Basophils Relative: 1 %
Blasts: NONE SEEN %
Eosinophils Relative: 1 %
HCT: 31.5 % — ABNORMAL LOW (ref 39.0–52.0)
Hemoglobin: 10.7 g/dL — ABNORMAL LOW (ref 13.0–17.0)
Lymphocytes Relative: 9 %
MCH: 29.2 pg (ref 26.0–34.0)
MCHC: 34 g/dL (ref 30.0–36.0)
MCV: 85.8 fL (ref 80.0–100.0)
Metamyelocytes Relative: NONE SEEN %
Monocytes Relative: 6 %
Myelocytes: NONE SEEN %
Neutrophils Relative %: 83 %
Platelets: 344 10*3/uL (ref 150–400)
Promyelocytes Relative: NONE SEEN %
RBC Morphology: NORMAL
RBC: 3.67 MIL/uL — ABNORMAL LOW (ref 4.22–5.81)
RDW: 13.6 % (ref 11.5–15.5)
WBC Morphology: NORMAL
WBC: 29.8 10*3/uL — ABNORMAL HIGH (ref 4.0–10.5)
nRBC: 0 % (ref 0.0–0.2)
nRBC: NONE SEEN /100 WBC

## 2021-08-15 LAB — BASIC METABOLIC PANEL
Anion gap: 10 (ref 5–15)
BUN: 39 mg/dL — ABNORMAL HIGH (ref 8–23)
CO2: 24 mmol/L (ref 22–32)
Calcium: 9 mg/dL (ref 8.9–10.3)
Chloride: 88 mmol/L — ABNORMAL LOW (ref 98–111)
Creatinine, Ser: 1.24 mg/dL (ref 0.61–1.24)
GFR, Estimated: >60 mL/min (ref >60)
Glucose, Bld: 180 mg/dL — ABNORMAL HIGH (ref 70–99)
Potassium: 4.9 mmol/L (ref 3.5–5.1)
Sodium: 122 mmol/L — ABNORMAL LOW (ref 135–145)

## 2021-08-15 LAB — AEROBIC/ANAEROBIC CULTURE W GRAM STAIN (SURGICAL/DEEP WOUND)

## 2021-08-15 LAB — GLUCOSE, CAPILLARY
Glucose-Capillary: 193 mg/dL — ABNORMAL HIGH (ref 70–99)
Glucose-Capillary: 187 mg/dL — ABNORMAL HIGH (ref 70–99)
Glucose-Capillary: 170 mg/dL — ABNORMAL HIGH (ref 70–99)
Glucose-Capillary: 123 mg/dL — ABNORMAL HIGH (ref 70–99)

## 2021-08-15 LAB — PROCALCITONIN: Procalcitonin: 0.83 ng/mL

## 2021-08-15 LAB — OSMOLALITY, URINE: Osmolality, Ur: 510 mOsm/kg (ref 300–900)

## 2021-08-15 IMAGING — CT CT ABD-PELV W/ CM
3 of 6 series · 15 of 46 positions shown, 17 images · IV contrast (APPLIED)
Comparison: None.

CLINICAL DATA: Sepsis Intrabdominal abscess sepsis, concerned for
intraabd abscess. Elevated white blood cell count.

EXAM:
CT ABDOMEN AND PELVIS WITH CONTRAST
TECHNIQUE: Multidetector CT imaging of the abdomen and pelvis was performed
using the standard protocol following bolus administration of
intravenous contrast.
CONTRAST:  80mL OMNIPAQUE IOHEXOL 350 MG/ML SOLN

[Series 2: axial st · axial · 0.95mm/px · z∈[-572,-132]mm · 10 of 108 slices shown, 12 images]
[im 10/108  soft-tissue]
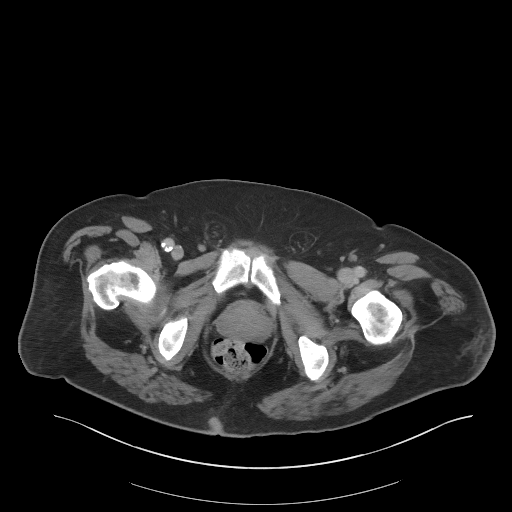
[im 10/108  bone]
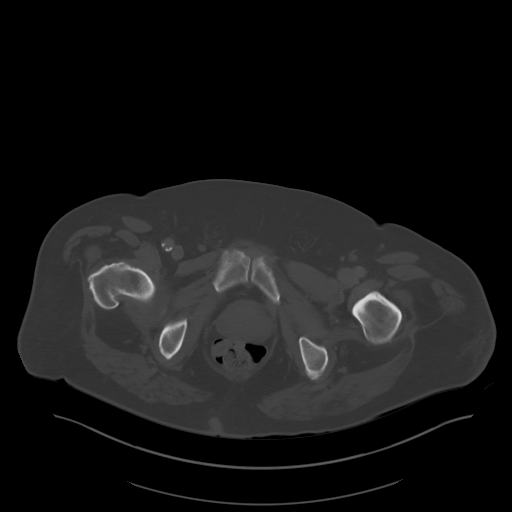
[im 20/108  soft-tissue]
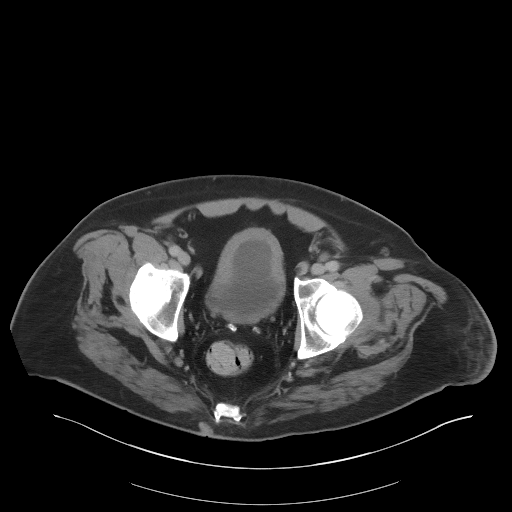
[im 30/108  soft-tissue]
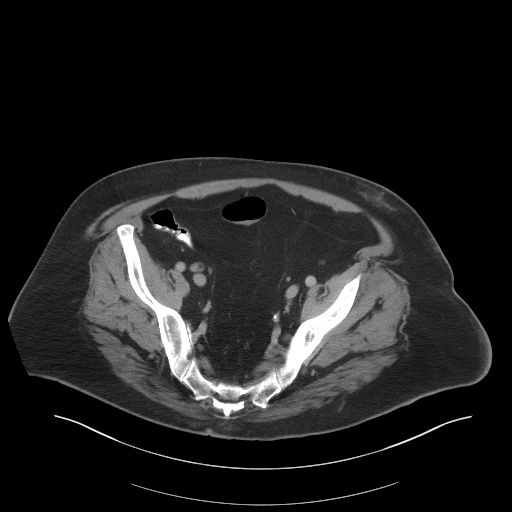
[im 39/108  soft-tissue]
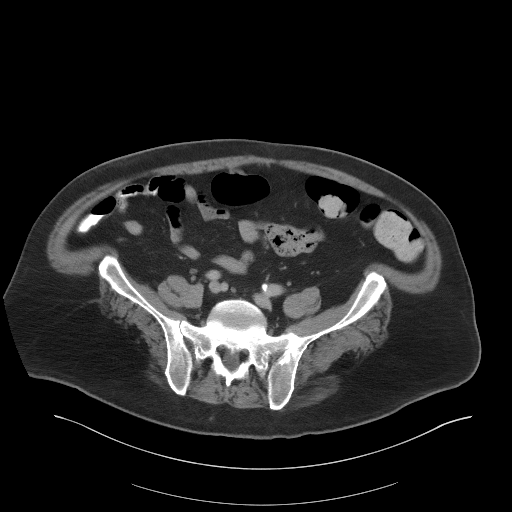
[im 49/108  soft-tissue]
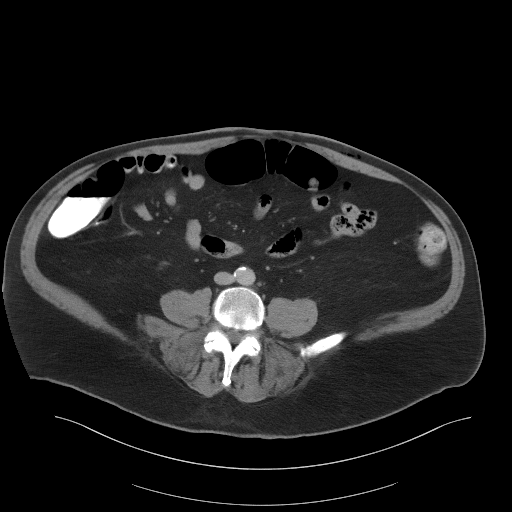
[im 59/108  soft-tissue]
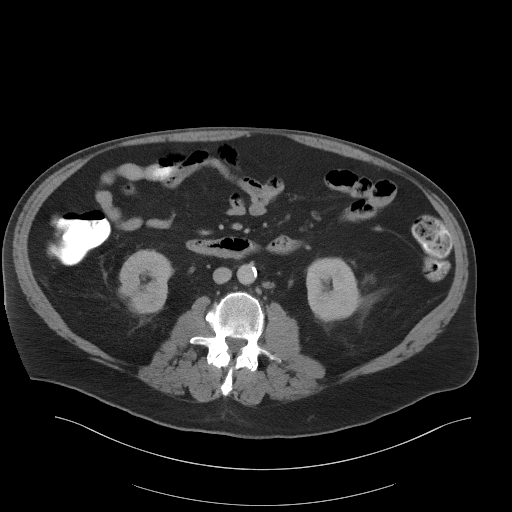
[im 69/108  soft-tissue]
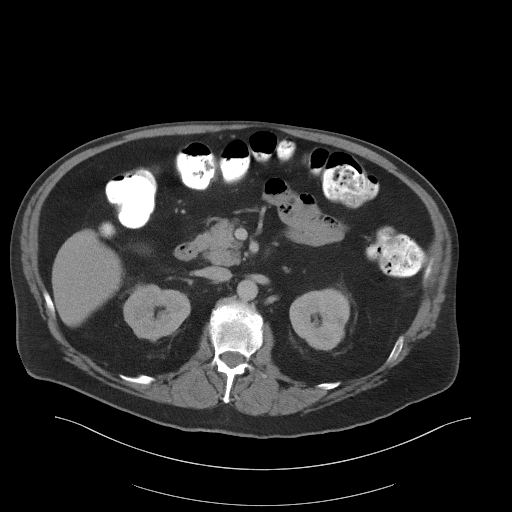
[im 78/108  soft-tissue]
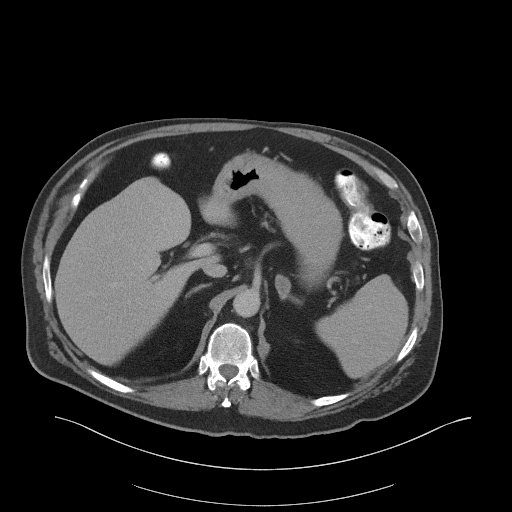
[im 88/108  soft-tissue]
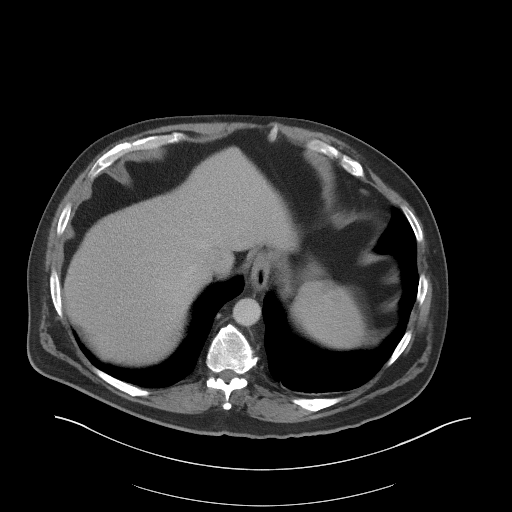
[im 88/108  bone]
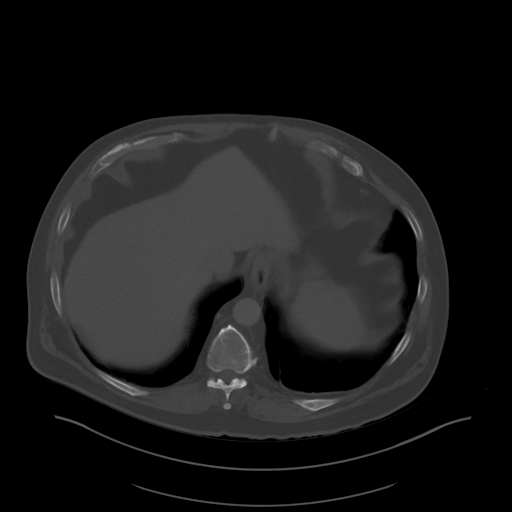
[im 98/108  soft-tissue]
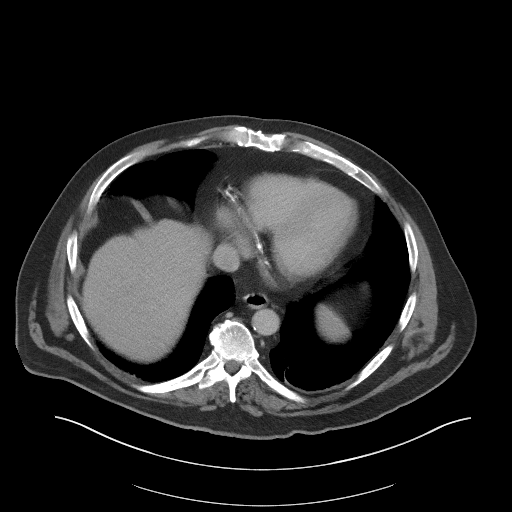

[Series 4: lung bases · axial · 0.95mm/px · z∈[-254,-236]mm · 2 of 95 slices shown]
[im 9/95  bone]
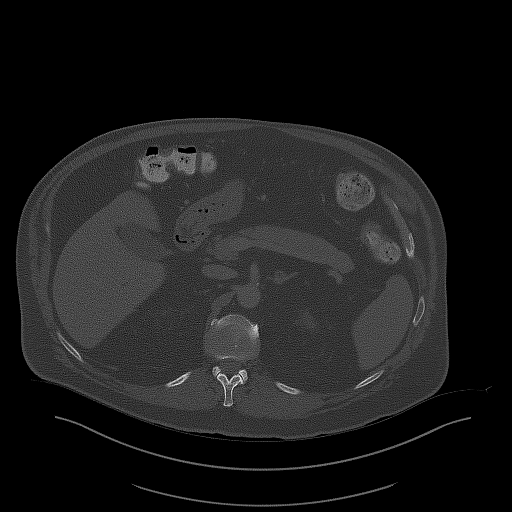
[im 18/95  bone]
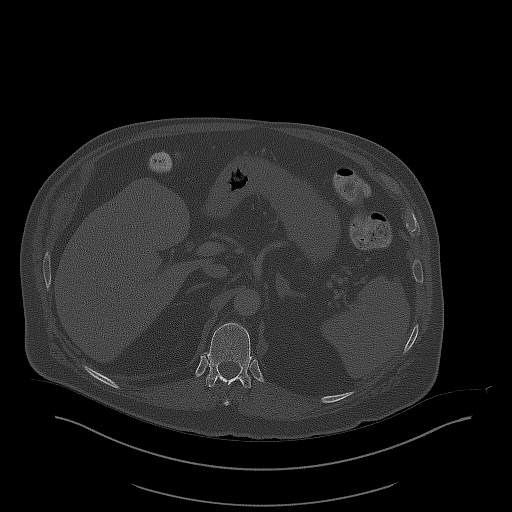

[Series 5: coronal st · coronal · 0.96mm/px · 3 of 104 slices shown]
[im 35/104  soft-tissue]
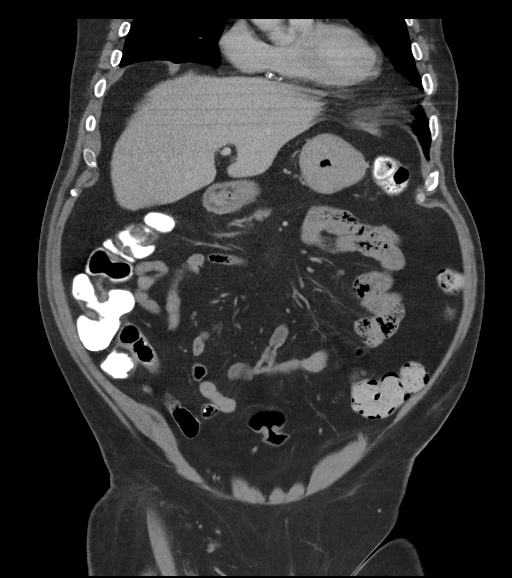
[im 46/104  soft-tissue]
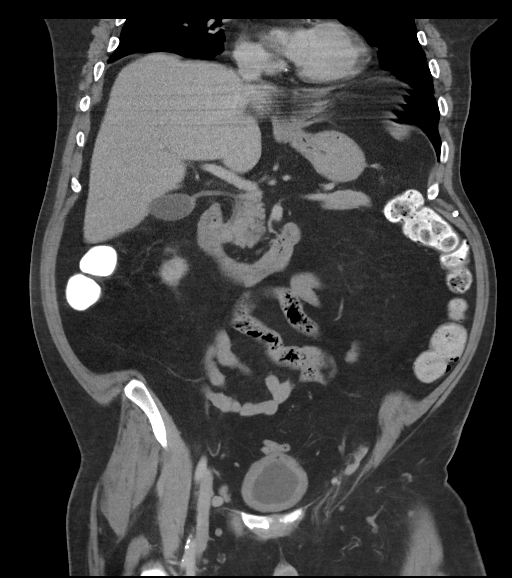
[im 58/104  soft-tissue]
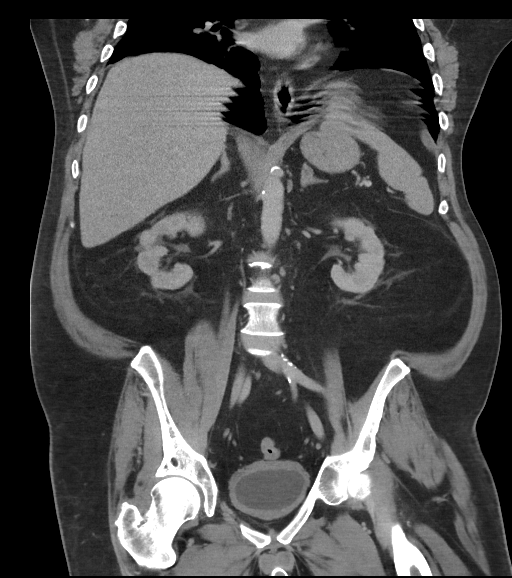

[15 of 46 positions shown; findings below may reference images not displayed]

FINDINGS: Lower chest: No acute abnormality. Coronary artery calcifications
four-vessel.

Hepatobiliary: No focal liver abnormality. Multiple subcentimeter
calcified gallstones noted within the gallbladder lumen. No
gallbladder wall thickening or pericholecystic fluid. No biliary
dilatation.

Pancreas: No focal lesion. Normal pancreatic contour. No surrounding
inflammatory changes. No main pancreatic ductal dilatation.

Spleen: Normal in size without focal abnormality.

Adrenals/Urinary Tract:

No nodularity of the right adrenal gland. There is a 1.9 cm left
adrenal gland nodule with a density of 28 Hounsfield units.

Nonspecific bilateral perinephric stranding. Bilateral kidneys
enhance symmetrically. Subcentimeter hypodensities are too small to
characterize.

No hydronephrosis. No hydroureter.

Irregular circumferential urinary bladder wall thickening.

Stomach/Bowel: PO contrast reaches the sigmoid colon. Stomach is
under distended and within normal limits. No evidence of bowel wall
thickening or dilatation. Appendix appears normal.

Vascular/Lymphatic: No abdominal aorta or iliac aneurysm.
Mild-to-moderate atherosclerotic plaque of the aorta and its
branches. No abdominal, pelvic, or inguinal lymphadenopathy.

Reproductive: Status post hysterectomy. No adnexal masses.

Other: No intraperitoneal free fluid. No intraperitoneal free gas.
No organized fluid collection.

Musculoskeletal:

No abdominal wall hernia or abnormality. Subcutaneus soft tissue
edema and emphysema along the bilateral lower anterior abdominal
wall likely related to medication injection.

Densely sclerotic lesion within the L2 and L5 vertebral bodies
likely represent bone islands. No suspicious lytic or blastic
osseous lesions. No acute displaced fracture. Multilevel
degenerative changes of the spine.
IMPRESSION: 1. Irregular circumferential urinary bladder wall thickening.
Finding may represent underlying infection or malignancy. Correlate
with urinalysis. Consider direct visualization.
2. Subcutaneus soft tissue edema and emphysema along the bilateral
lower anterior abdominal wall likely related to medication
injection. Correlate with clinical history/physical exam.
3. Cholelithiasis with no CT findings of acute cholecystitis or
choledocholithiasis.
4. Indeterminate 1.9 cm left adrenal gland nodule. Recommend CT
adrenal gland protocol in 1 year for further evaluation and
evaluation of stability.
5. Densely sclerotic lesion within the L2 and L5 vertebral bodies
likely represent bone islands. Recommend attention on follow-up.
6.  Aortic Atherosclerosis ([8J]-[8J]).

## 2021-08-15 MED ORDER — VITAMIN D (ERGOCALCIFEROL) 1.25 MG (50000 UNIT) PO CAPS: 50000.0000 [IU] | ORAL_CAPSULE | ORAL | 0 refills | Status: DC

## 2021-08-15 MED ORDER — SENNOSIDES-DOCUSATE SODIUM 8.6-50 MG PO TABS: 2.0000 | ORAL_TABLET | Freq: Two times a day (BID) | ORAL | Status: DC

## 2021-08-15 MED ORDER — POLYETHYLENE GLYCOL 3350 17 G PO PACK
17.0000 g | PACK | Freq: Every day | ORAL | 0 refills | Status: DC | PRN
Start: 2021-08-15 — End: 2022-07-26

## 2021-08-15 MED ORDER — IOHEXOL 9 MG/ML PO SOLN
ORAL | Status: AC
  Administered 2021-08-15: 500 mL via ORAL
  Filled 2021-08-15: qty 1000

## 2021-08-15 MED ORDER — IOHEXOL 350 MG/ML SOLN
80.0000 mL | Freq: Once | INTRAVENOUS | Status: AC | PRN
  Administered 2021-08-15: 80 mL via INTRAVENOUS

## 2021-08-15 MED ORDER — IOHEXOL 9 MG/ML PO SOLN
500.0000 mL | ORAL | Status: AC
  Administered 2021-08-15: 500 mL via ORAL

## 2021-08-15 NOTE — Progress Notes (Signed)
Physical Therapy Treatment Patient Details Name: [name] [name] MRN: [id number] DOB: 12-14-1955 Today's Date: 08/15/2021   History of Present Illness Pt s/p R great toe amputation 08/09/21 and R foot I&D 08/13/21.  Pt with hx of CAD, CABG, DM and "bad L knee"  - pt states he can not rely on L knee to support him.    PT Comments    Patient making some progress with mobility but limited by weight bearing restrictions on Rt foot and pain/weakness. He was unable to rise to stand on Lt LE with Max +2 assist and use of RW due to inability to maintain NWB on Rt LE. Lateral scoots performed with mod assist to move to recliner, cues needed for safety to prevent pt scoot too far forward and assist to keep Rt LE elevated off ground. He will benefit from continued skilled PT interventions to progress mobility as able.    Recommendations for follow up therapy are one component of a multi-disciplinary discharge planning process, led by the attending physician.  Recommendations may be updated based on patient status, additional functional criteria and insurance authorization.  Follow Up Recommendations  Home health PT     Equipment Recommendations  Rolling walker with 5" wheels;Wheelchair (measurements PT);Wheelchair cushion (measurements PT) (elevated leg rests)    Recommendations for Other Services OT consult     Precautions / Restrictions Precautions Precautions: Fall Required Braces or Orthoses: Other Brace Other Brace: Darko shoe for R foot in orders but not in room at this time. Restrictions Weight Bearing Restrictions: Yes RLE Weight Bearing: Non weight bearing     Mobility  Bed Mobility Overal bed mobility: Needs Assistance Bed Mobility: Supine to Sit     Supine to sit: Min guard;HOB elevated     General bed mobility comments: pt using bed rail and requires increased time.    Transfers Overall transfer level: Needs assistance Equipment used: Rolling walker (2  wheeled);None Transfers: Sit to/from Stand;Lateral/Scoot Transfers Sit to Stand: From elevated surface;+2 physical assistance;Max assist        Lateral/Scoot Transfers: Mod assist General transfer comment: attempted sit<>stand but pt unable to rise fully and maintain NWB on Rt LE. pt abelt o use bil UE's and Lt LE to complete multiple lateral scoots to move EOB to Recliner. Cues needed for safety as pt with tendency to scoot anteriorly at the same time and move too far to edge.  Ambulation/Gait                 Stairs             Wheelchair Mobility    Modified Rankin (Stroke Patients Only)       Balance Overall balance assessment: Needs assistance Sitting-balance support: Bilateral upper extremity supported Sitting balance-Leahy Scale: Good                                      Cognition Arousal/Alertness: Awake/alert Behavior During Therapy: WFL for tasks assessed/performed Overall Cognitive Status: Within Functional Limits for tasks assessed                                        Exercises      General Comments        Pertinent Vitals/Pain Pain Assessment: 0-10 Pain Score: 4  Pain Location: R foot  Pain Descriptors / Indicators: Aching Pain Intervention(s): Limited activity within patient's tolerance;Monitored during session;Repositioned    Home Living                      Prior Function            PT Goals (current goals can now be found in the care plan section) Acute Rehab PT Goals Patient Stated Goal: HOME with spouse PT Goal Formulation: With patient Time For Goal Achievement: 08/28/21 Potential to Achieve Goals: Good Progress towards PT goals: Progressing toward goals    Frequency    Min 3X/week      PT Plan Current plan remains appropriate    Co-evaluation   Reason for Co-Treatment: To address functional/ADL transfers;For patient/therapist safety PT goals addressed during session:  Mobility/safety with mobility;Balance OT goals addressed during session: ADL's and self-care      AM-PAC PT "6 Clicks" Mobility   Outcome Measure  Help needed turning from your back to your side while in a flat bed without using bedrails?: A Little Help needed moving from lying on your back to sitting on the side of a flat bed without using bedrails?: A Little Help needed moving to and from a bed to a chair (including a wheelchair)?: A Lot Help needed standing up from a chair using your arms (e.g., wheelchair or bedside chair)?: Total Help needed to walk in hospital room?: Total Help needed climbing 3-5 steps with a railing? : Total 6 Click Score: 11    End of Session Equipment Utilized During Treatment: Gait belt Activity Tolerance: Patient tolerated treatment well Patient left: in chair;with call bell/phone within reach;with chair alarm set Nurse Communication: Mobility status PT Visit Diagnosis: Muscle weakness (generalized) (M62.81);Difficulty in walking, not elsewhere classified (R26.2);Pain Pain - Right/Left: Right Pain - part of body: Ankle and joints of foot     Time: 1200-1229 PT Time Calculation (min) (ACUTE ONLY): 29 min  Charges:  $Therapeutic Activity: 8-22 mins                     Wynn Maudlin, DPT Acute Rehabilitation Services Office [phone] Pager [phone]    Anitra Lauth 08/15/2021, 4:03 PM

## 2021-08-15 NOTE — Progress Notes (Signed)
[facility]  Date of Admission:  08/08/2021       Lines: Rue picc  Abx: 9/23-c penicillin 9/23-c metronidazole  9/21-23 ceftriaxone 9/20-21 cefazolin  A/p Strep intermedius bacteremia Bilateral dm feet ulcer; right phalanx/2nd toe osteo  S/p amputation 1st & 2nd toe; wound cx strep intermedius, prevotella, gbs Tte negative; strep intermedius bacteremia cleared  Patient has planned 4 weeks iv pcn and po flagyl per Dr Algis Liming  -------- 9/26 assessment Wbc rising and stayed high. No other evidence sepsis No diarrhea to suggest cdiff Given hx strep intermedius bacteremia (a bug that like to cause abscess), will obtain ct abd/pelv to rule this out  If all negative, will see if can chalk up to inflammatory/necrotic process in the foot He didn't really received any prolonged abx prior to surgery/culture (1 day cefazolin)  -continue penicillin/flagyl -abd pelv ct -repeat crp today and in 1-2 more days -discussed with primary team    I spent more than 35 minute reviewing data/chart, and coordinating care and >50% direct face to face time providing counseling/discussing diagnostics/treatment plan with patient  Active Problems:   Toe infection   Cellulitis of right lower extremity   Osteomyelitis of great toe of right foot (HCC)   Osteomyelitis of second toe of right foot (HCC)   Diabetes mellitus due to underlying condition with diabetic autonomic neuropathy, without long-term current use of insulin (HCC)   Class 3 severe obesity with serious comorbidity and body mass index (BMI) of 45.0 to 49.9 in adult [facility])   Elevated LFTs   Post-operative state   Cellulitis of left lower extremity   Streptococcal bacteremia   No Known Allergies  Scheduled Meds:  amLODipine  10 mg Oral QHS   aspirin  325 mg Oral Daily   buPROPion  300 mg Oral Daily   calcium carbonate  400 mg of elemental calcium Oral BID   Chlorhexidine Gluconate Cloth  6  each Topical Daily   enoxaparin (LOVENOX) injection  60 mg Subcutaneous Q24H   insulin aspart  0-15 Units Subcutaneous TID WC   insulin aspart  0-5 Units Subcutaneous QHS   insulin aspart  8 Units Subcutaneous TID WC   insulin glargine-yfgn  15 Units Subcutaneous BID   iohexol  500 mL Oral Q1H   irbesartan  300 mg Oral Daily   metoprolol tartrate  100 mg Oral BID   metroNIDAZOLE  500 mg Oral Q12H   omega-3 acid ethyl esters  1 g Oral Daily   pantoprazole  40 mg Oral Daily   senna-docusate  2 tablet Oral BID   sodium chloride flush  10-40 mL Intracatheter Q12H   Vitamin D (Ergocalciferol)  50,000 Units Oral Q7 days   Continuous Infusions:  penicillin g continuous IV infusion 9 Million Units (08/15/21 0935)   PRN Meds:.ALPRAZolam, oxyCODONE, polyethylene glycol, sodium chloride flush, traMADol   SUBJECTIVE: Felt well Foot in bulky dressing  Drain remains in place No n/v/diarrhea No rash Eating well No abd pain No headache  Wbc remains very high but no sepsis otherwise  Review of Systems: ROS All other ROS was negative, except mentioned above     OBJECTIVE: Vitals:   08/14/21 0543 08/14/21 1432 08/14/21 2041 08/15/21 0432  BP: 136/71 111/75 120/70 128/70  Pulse: 72 60 79 70  Resp: 16 14 16 14   Temp: 98.8 F (37.1 C)  98.1 F (36.7 C) (!) 97.4 F (36.3 C)  TempSrc: Oral  Oral  Oral  SpO2: 95% 98% 97% 97%  Weight:      Height:       Body mass index is 35.46 kg/m.  Physical Exam General/constitutional: no distress, pleasant HEENT: Normocephalic, PER, Conj Clear, EOMI, Oropharynx clear Neck supple CV: rrr no mrg Lungs: clear to auscultation, normal respiratory effort Abd: Soft, Nontender Ext: no edema Skin: No Rash Neuro: nonfocal MSK: right foot bulky dressin that is c/d/I; jp drain with serosanguinous output  Central line presence: rue picc site no erythema/discharge   Lab Results Lab Results  Component Value Date   WBC 29.8 (H) 08/15/2021   HGB  10.7 (L) 08/15/2021   HCT 31.5 (L) 08/15/2021   MCV 85.8 08/15/2021   PLT 344 08/15/2021    Lab Results  Component Value Date   CREATININE 1.24 08/15/2021   BUN 39 (H) 08/15/2021   NA 122 (L) 08/15/2021   K 4.9 08/15/2021   CL 88 (L) 08/15/2021   CO2 24 08/15/2021    Lab Results  Component Value Date   ALT 45 (H) 08/14/2021   AST 34 08/14/2021   ALKPHOS 178 (H) 08/14/2021   BILITOT 1.2 08/14/2021      Microbiology: Recent Results (from the past 240 hour(s))  Blood Cultures x 2 sites     Status: Abnormal   Collection Time: 08/08/21  1:07 PM   Specimen: BLOOD  Result Value Ref Range Status   Specimen Description   Final    BLOOD RIGHT ANTECUBITAL Performed at [facility], [address] [place], [place] [address]    Special Requests   Final    BOTTLES DRAWN AEROBIC AND ANAEROBIC Blood Culture results may not be optimal due to an excessive volume of blood received in culture bottles Performed at [facility], [address], [place], [place] [address]    Culture  Setup Time   Final    GRAM POSITIVE COCCI IN BOTH AEROBIC AND ANAEROBIC BOTTLES CRITICAL RESULT CALLED TO, READ BACK BY AND VERIFIED WITH: Janine Limbo PHARMD [id number] 08/09/21 A BROWNING Performed at [facility], [address] [place], [place] [address]    Culture STREPTOCOCCUS INTERMEDIUS (A)  Final   Report Status 08/13/2021 FINAL  Final   Organism ID, Bacteria STREPTOCOCCUS INTERMEDIUS  Final      Susceptibility   Streptococcus intermedius - MIC*    PENICILLIN <=0.06 SENSITIVE Sensitive     CEFTRIAXONE <=0.12 SENSITIVE Sensitive     ERYTHROMYCIN >=8 RESISTANT Resistant     LEVOFLOXACIN <=0.25 SENSITIVE Sensitive     VANCOMYCIN 0.25 SENSITIVE Sensitive     * STREPTOCOCCUS INTERMEDIUS  Blood Cultures x 2 sites     Status: Abnormal   Collection Time: 08/08/21  1:07 PM   Specimen: BLOOD  Result Value Ref Range Status   Specimen Description   Final    BLOOD BLOOD  RIGHT FOREARM Performed at [facility], [address] [place], [place] [address]    Special Requests   Final    BOTTLES DRAWN AEROBIC AND ANAEROBIC Blood Culture results may not be optimal due to an excessive volume of blood received in culture bottles Performed at [facility], [address], [place], [place] [address]    Culture  Setup Time   Final    GRAM POSITIVE COCCI ANAEROBIC BOTTLE ONLY CRITICAL VALUE NOTED.  VALUE IS CONSISTENT WITH PREVIOUSLY REPORTED AND CALLED VALUE.    Culture (A)  Final    STREPTOCOCCUS  INTERMEDIUS SUSCEPTIBILITIES PERFORMED ON PREVIOUS CULTURE WITHIN THE LAST 5 DAYS. Performed at [facility], [address], [place], [place] [address]    Report Status 08/12/2021 FINAL  Final  Blood Culture ID Panel (Reflexed)     Status: Abnormal   Collection Time: 08/08/21  1:07 PM  Result Value Ref Range Status   Enterococcus faecalis NOT DETECTED NOT DETECTED Final   Enterococcus Faecium NOT DETECTED NOT DETECTED Final   Listeria monocytogenes NOT DETECTED NOT DETECTED Final   Staphylococcus species NOT DETECTED NOT DETECTED Final   Staphylococcus aureus (BCID) NOT DETECTED NOT DETECTED Final   Staphylococcus epidermidis NOT DETECTED NOT DETECTED Final   Staphylococcus lugdunensis NOT DETECTED NOT DETECTED Final   Streptococcus species DETECTED (A) NOT DETECTED Final    Comment: Not Enterococcus species, Streptococcus agalactiae, Streptococcus pyogenes, or Streptococcus pneumoniae. CRITICAL RESULT CALLED TO, READ BACK BY AND VERIFIED WITH: Janine Limbo PHARMD [id number] 08/09/21 A BROWNING    Streptococcus agalactiae NOT DETECTED NOT DETECTED Final   Streptococcus pneumoniae NOT DETECTED NOT DETECTED Final   Streptococcus pyogenes NOT DETECTED NOT DETECTED Final   A.calcoaceticus-baumannii NOT DETECTED NOT DETECTED Final   Bacteroides fragilis NOT DETECTED NOT DETECTED Final   Enterobacterales NOT DETECTED NOT DETECTED  Final   Enterobacter cloacae complex NOT DETECTED NOT DETECTED Final   Escherichia coli NOT DETECTED NOT DETECTED Final   Klebsiella aerogenes NOT DETECTED NOT DETECTED Final   Klebsiella oxytoca NOT DETECTED NOT DETECTED Final   Klebsiella pneumoniae NOT DETECTED NOT DETECTED Final   Proteus species NOT DETECTED NOT DETECTED Final   Salmonella species NOT DETECTED NOT DETECTED Final   Serratia marcescens NOT DETECTED NOT DETECTED Final   Haemophilus influenzae NOT DETECTED NOT DETECTED Final   Neisseria meningitidis NOT DETECTED NOT DETECTED Final   Pseudomonas aeruginosa NOT DETECTED NOT DETECTED Final   Stenotrophomonas maltophilia NOT DETECTED NOT DETECTED Final   Candida albicans NOT DETECTED NOT DETECTED Final   Candida auris NOT DETECTED NOT DETECTED Final   Candida glabrata NOT DETECTED NOT DETECTED Final   Candida krusei NOT DETECTED NOT DETECTED Final   Candida parapsilosis NOT DETECTED NOT DETECTED Final   Candida tropicalis NOT DETECTED NOT DETECTED Final   Cryptococcus neoformans/gattii NOT DETECTED NOT DETECTED Final    Comment: Performed at [facility], [address] [place], [place] [address]  Resp Panel by RT-PCR (Flu A&B, Covid) Nasopharyngeal Swab     Status: None   Collection Time: 08/08/21  3:00 PM   Specimen: Nasopharyngeal Swab; Nasopharyngeal(NP) swabs in vial transport medium  Result Value Ref Range Status   SARS Coronavirus 2 by RT PCR NEGATIVE NEGATIVE Final    Comment: (NOTE) SARS-CoV-2 target nucleic acids are NOT DETECTED.  The SARS-CoV-2 RNA is generally detectable in upper respiratory specimens during the acute phase of infection. The lowest concentration of SARS-CoV-2 viral copies this assay can detect is 138 copies/mL. A negative result does not preclude SARS-Cov-2 infection and should not be used as the sole basis for treatment or other patient management decisions. A negative result may occur with  improper specimen collection/handling,  submission of specimen other than nasopharyngeal swab, presence of viral mutation(s) within the areas targeted by this assay, and inadequate number of viral copies(<138 copies/mL). A negative result must be combined with clinical observations, patient history, and epidemiological information. The expected result is Negative.  Fact Sheet for Patients:  BloggerCourse.com  Fact Sheet for Healthcare Providers:  SeriousBroker.it  This test is no  t yet approved or cleared by the [place] and  has been authorized for detection and/or diagnosis of SARS-CoV-2 by FDA under an Emergency Use Authorization (EUA). This EUA will remain  in effect (meaning this test can be used) for the duration of the COVID-19 declaration under Section 564(b)(1) of the Act, 21 U.S.C.section 360bbb-3(b)(1), unless the authorization is terminated  or revoked sooner.       Influenza A by PCR NEGATIVE NEGATIVE Final   Influenza B by PCR NEGATIVE NEGATIVE Final    Comment: (NOTE) The Xpert Xpress SARS-CoV-2/FLU/RSV plus assay is intended as an aid in the diagnosis of influenza from Nasopharyngeal swab specimens and should not be used as a sole basis for treatment. Nasal washings and aspirates are unacceptable for Xpert Xpress SARS-CoV-2/FLU/RSV testing.  Fact Sheet for Patients: BloggerCourse.com  Fact Sheet for Healthcare Providers: SeriousBroker.it  This test is not yet approved or cleared by the [place] FDA and has been authorized for detection and/or diagnosis of SARS-CoV-2 by FDA under an Emergency Use Authorization (EUA). This EUA will remain in effect (meaning this test can be used) for the duration of the COVID-19 declaration under Section 564(b)(1) of the Act, 21 U.S.C. section 360bbb-3(b)(1), unless the authorization is terminated or revoked.  Performed at [facility], [address], [place], [place] [address]   Aerobic/Anaerobic Culture w Gram Stain (surgical/deep wound)     Status: None   Collection Time: 08/09/21  6:18 PM   Specimen: Abscess  Result Value Ref Range Status   Specimen Description   Final    ABSCESS RIGHT BIG TOE Performed at [facility], [address] [place], [place] [address]    Special Requests   Final    NONE Performed at [facility], [address] [place], [place] [address]    Gram Stain   Final    RARE WBC PRESENT, PREDOMINANTLY MONONUCLEAR ABUNDANT GRAM POSITIVE COCCI IN PAIRS ABUNDANT GRAM POSITIVE COCCI IN CLUSTERS    Culture   Final    FEW STREPTOCOCCUS AGALACTIAE TESTING AGAINST S. AGALACTIAE NOT ROUTINELY PERFORMED DUE TO PREDICTABILITY OF AMP/PEN/VAN SUSCEPTIBILITY. MODERATE STREPTOCOCCUS INTERMEDIUS FEW PREVOTELLA BIVIA BETA LACTAMASE POSITIVE Performed at [facility], [address] [place], [place] [address]    Report Status 08/15/2021 FINAL  Final   Organism ID, Bacteria STREPTOCOCCUS INTERMEDIUS  Final      Susceptibility   Streptococcus intermedius - MIC*    PENICILLIN <=0.06 SENSITIVE Sensitive     CEFTRIAXONE <=0.12 SENSITIVE Sensitive     ERYTHROMYCIN >=8 RESISTANT Resistant     LEVOFLOXACIN <=0.25 SENSITIVE Sensitive     VANCOMYCIN 0.25 SENSITIVE Sensitive     * MODERATE STREPTOCOCCUS INTERMEDIUS  Culture, blood (Routine X 2) w Reflex to ID Panel     Status: None (Preliminary result)   Collection Time: 08/11/21 10:37 AM   Specimen: BLOOD  Result Value Ref Range Status   Specimen Description   Final    BLOOD BLOOD RIGHT FOREARM Performed at [facility], [address] [place], [place] [address]    Special Requests   Final    BOTTLES DRAWN AEROBIC AND ANAEROBIC Blood Culture adequate volume Performed at [facility], [address], [place], [place] [address]    Culture   Final    NO GROWTH  4 DAYS Performed at [facility], [address], [place], [place] [address]    Report Status PENDING  Incomplete  Culture, blood (Routine X 2) w Reflex to ID Panel     Status: None (Preliminary result)   Collection Time: 08/11/21 10:41 AM   Specimen: BLOOD  Result Value Ref Range Status   Specimen Description   Final    BLOOD BLOOD LEFT FOREARM Performed at [facility], [address], [place], [place] [address]    Special Requests   Final    BOTTLES DRAWN AEROBIC AND ANAEROBIC Blood Culture results may not be optimal due to an excessive volume of blood received in culture bottles Performed at [facility], [address] [place], [place] [address]    Culture   Final    NO GROWTH 4 DAYS Performed at [facility], [address] [place], [place] [address]    Report Status PENDING  Incomplete     Serology:   Imaging: If present, new imagings (plain films, ct scans, and mri) have been personally visualized and interpreted; radiology reports have been reviewed. Decision making incorporated into the Impression / Recommendations.   Raymondo Band, MD [facility] [phone] pager    08/15/2021, 12:56 PM

## 2021-08-15 NOTE — Treatment Plan (Signed)
Came to see patient and change dressings to evaluate feet in setting of severe leukocytosis, he was unavailable in the CT scanner for CT AP.  Per nurse incision looks good and had minimal bloody drainage and did not appear to be infected.  I will see him in the morning and change his dressing.  Sharl Ma, DPM 08/15/2021

## 2021-08-15 NOTE — Progress Notes (Signed)
PROGRESS NOTE    [name] [name]  WEX:[id number] DOB: October 30, 1956 DOA: 08/08/2021 PCP: Tally Joe, MD    Chief Complaint  Patient presents with   Wound Infection    Brief Narrative:  65 year old gentleman prior history of hypertension, type 2 diabetes, hyperlipidemia presents to the hospital with left lower extremity cellulitis as well as right great toe nonhealing wound with pain and tenderness.  Patient reports that over the last 1 week prior to admission patient had worsening erythema pain and foul-smelling discharge from the right toe.  He follows up with wound care.  Podiatry and ID were consulted and patient underwent partial first ray amputation of the right foot, partial second toe amputation and debridement of the deep abscess in the right foot by Dr. Loreta Ave on 08/09/2021.  ID on board, appreciate input.   Assessment & Plan:   Active Problems:   Toe infection   Cellulitis of right lower extremity   Osteomyelitis of great toe of right foot (HCC)   Osteomyelitis of second toe of right foot (HCC)   Diabetes mellitus due to underlying condition with diabetic autonomic neuropathy, without long-term current use of insulin (HCC)   Class 3 severe obesity with serious comorbidity and body mass index (BMI) of 45.0 to 49.9 in adult [facility])   Elevated LFTs   Post-operative state   Cellulitis of left lower extremity   Streptococcal bacteremia   Right foot ulcer, MRI on admission of the right foot was concerning for septic arthritis and osteomyelitis of the first proximal and distal phalanges as well as osteomyelitis at the second distal phalanx. It also showed infectious tenosynovitis around the first metatarsal and multiple forefoot abscesses.  Podiatry consulted and he underwent partial first ray amputation, partial second toe amputation and debridement of the deep abscess of the right foot by Dr. Loreta Ave on 08/09/2021. In view of worsening leukocytosis, He underwent repeat debridement of  the right foot ON 08/13/21.  ID on board and appreciate recommendations. He was started on IV rocephin, transitioned to IV penicillin and flagyl.  MRI of the left foot does not show any osteomyelitis. Cultures from the right foot show streptococcus intermedius sensitive to penicillin.  Therapy evaluations ordered and recommended home health PT/OT.    Streptococcus intermedius bacteremia: from cultures drawn on 08/08/21 Possibly from diabetic foot infection.  Echocardiogram does not show any evidence of valvular vegetations.  Blood cultures are repeated have been Negative so far,  PICC line to be placed.  ID recommended 4 weeks of IV penicillin and flagyl.  Pt is afebrile ,but has persistent leukocytosis, ID recommended getting CT abd pelvis for evaluation of abd abscess.    Left lower extremity cellulitis Improved.    Elevated liver enzymes patient currently denies any nausea and vomiting, continue to monitor.  Right upper quadrant ultrasound showed cholelithiasis.  Patient is currently asymptomatic.   Stage IIIa CKD Baseline creatinine around 1.5-1.7 Creatinine of 1.53.     Type 2 diabetes mellitus with hyperglycemia and with CKD Continue with sliding scale insulin. CBG (last 3)  Recent Labs    08/14/21 2044 08/15/21 0741 08/15/21 1134  GLUCAP 171* 193* 187*   Last hemoglobin A1c around 6.2 Patient is on Ozempic at home which is on hold.  Increase in Semglee 15 units BID, increase novolog 8 units TIDAC. Cbg's are better today.    Hyperlipidemia Statin on hold due to elevated liver enzymes.   Hyponatremia:  Continues to worsen.  Possibly from IV lasix. Vs Pseudohyponatremia from  hyperglycemia. Vs SIADH TSH, cortisol, urine sodium and serum osmo ordered for further eval.  Serum osmo is 288. Cortisol is 7.1, urine sodium is 63, TSH is wnl.  Sodium is 122 today, will request nephrology input.    Hypertension:  Better controlled.  Resume amlodipine and metoprolol.     Hyperkalemia:  Resolved.  Avapro discontinued.      DVT prophylaxis: (Lovenox) Code Status: (Full code) Family Communication: (None at bedside).  Disposition:   Status is: Inpatient  Remains inpatient appropriate because:Ongoing diagnostic testing needed not appropriate for outpatient work up and IV treatments appropriate due to intensity of illness or inability to take PO  Dispo: The patient is from: Home              Anticipated d/c is to: Home              Patient currently is not medically stable to d/c.   Difficult to place patient No       Consultants:  Podiatry  ID.   Procedures:  Partial 1st ray amputation right foot; partial 2nd toe amputation, I&D deep abscess right foot.  by dr Ardelle Anton on 08/09/21  Incision and drainage of the right foot wound on 08/13/21.  Antimicrobials:  Antibiotics Given (last 72 hours)     Date/Time Action Medication Dose Rate   08/12/21 1444 Given   metroNIDAZOLE (FLAGYL) tablet 500 mg 500 mg    08/12/21 2151 Given   metroNIDAZOLE (FLAGYL) tablet 500 mg 500 mg    08/13/21 0841 Given   vancomycin (VANCOCIN) powder 1,000 mg    08/13/21 1256 Given  [Patient at surgery, too drowsy to swallow pills when back on unit]   metroNIDAZOLE (FLAGYL) tablet 500 mg 500 mg    08/13/21 1309 New Bag/Given   penicillin G potassium 9 Million Units in dextrose 5 % 500 mL continuous infusion 9 Million Units 41.7 mL/hr   08/13/21 2135 Given   metroNIDAZOLE (FLAGYL) tablet 500 mg 500 mg    08/14/21 0011 New Bag/Given   penicillin G potassium 9 Million Units in dextrose 5 % 500 mL continuous infusion 9 Million Units 41.7 mL/hr   08/14/21 1011 Given   metroNIDAZOLE (FLAGYL) tablet 500 mg 500 mg    08/14/21 1150 New Bag/Given   penicillin G potassium 9 Million Units in dextrose 5 % 500 mL continuous infusion 9 Million Units 41.7 mL/hr   08/14/21 2137 Given   metroNIDAZOLE (FLAGYL) tablet 500 mg 500 mg    08/14/21 2332 New Bag/Given   penicillin G  potassium 9 Million Units in dextrose 5 % 500 mL continuous infusion 9 Million Units 41.7 mL/hr   08/15/21 0931 Given   metroNIDAZOLE (FLAGYL) tablet 500 mg 500 mg    08/15/21 0935 New Bag/Given   penicillin G potassium 9 Million Units in dextrose 5 % 500 mL continuous infusion 9 Million Units 41.7 mL/hr         Subjective: No chest pain or sob, no nausea, vomiting or abd pain.   Objective: Vitals:   08/14/21 1432 08/14/21 2041 08/15/21 0432 08/15/21 1331  BP: 111/75 120/70 128/70 132/74  Pulse: 60 79 70 62  Resp: 14 16 14 16   Temp:  98.1 F (36.7 C) (!) 97.4 F (36.3 C)   TempSrc:  Oral Oral   SpO2: 98% 97% 97% 100%  Weight:      Height:        Intake/Output Summary (Last 24 hours) at 08/15/2021 1352 Last data filed at  08/15/2021 0902 Gross per 24 hour  Intake 504.29 ml  Output 1350 ml  Net -845.71 ml    Filed Weights   08/08/21 1149 08/08/21 1738 08/09/21 1625  Weight: 112.9 kg 118.6 kg 118.6 kg    Examination:  General exam: Appears calm and comfortable  Respiratory system: Clear to auscultation. Respiratory effort normal. Cardiovascular system: S1 & S2 heard, RRR. No JVD, murmurs,pedal edema on the left improving.  Gastrointestinal system: Abdomen is nondistended, soft and nontender. Normal bowel sounds heard. Central nervous system: Alert and oriented. No focal neurological deficits. Extremities: right foot bandaged.  Skin: cellulitis of the left leg has improved.  Psychiatry: Mood & affect appropriate.      Data Reviewed: I have personally reviewed following labs and imaging studies  CBC: Recent Labs  Lab 08/10/21 0503 08/11/21 0846 08/12/21 0948 08/14/21 1303 08/15/21 0321  WBC 24.8* 25.2* 29.1* 28.7* 29.8*  NEUTROABS  --  20.7* 22.1* 22.7*  --   HGB 12.1* 12.1* 12.3* 11.1* 10.7*  HCT 35.6* 35.6* 35.5* 32.5* 31.5*  MCV 86.6 84.8 84.1 85.3 85.8  PLT 271 245 287 336 344     Basic Metabolic Panel: Recent Labs  Lab 08/10/21 0503  08/11/21 0846 08/12/21 0459 08/14/21 1303 08/15/21 0321  NA 134* 126* 127* 123* 122*  K 3.7 4.5 5.0 5.3* 4.9  CL 97* 90* 94* 90* 88*  CO2 28 25 24 25 24   GLUCOSE 191* 347* 221* 347* 180*  BUN 30* 27* 32* 39* 39*  CREATININE 1.25* 1.17 1.26* 1.53* 1.24  CALCIUM 8.3* 8.9 9.5 9.6 9.0     GFR: Estimated Creatinine Clearance: 81.1 mL/min (by C-G formula based on SCr of 1.24 mg/dL).  Liver Function Tests: Recent Labs  Lab 08/09/21 0546 08/14/21 1303  AST 29 34  ALT 64* 45*  ALKPHOS 81 178*  BILITOT 1.2 1.2  PROT 6.4* 7.0  ALBUMIN 2.7* 2.2*     CBG: Recent Labs  Lab 08/14/21 1144 08/14/21 1831 08/14/21 2044 08/15/21 0741 08/15/21 1134  GLUCAP 284* 193* 171* 193* 187*      Recent Results (from the past 240 hour(s))  Blood Cultures x 2 sites     Status: Abnormal   Collection Time: 08/08/21  1:07 PM   Specimen: BLOOD  Result Value Ref Range Status   Specimen Description   Final    BLOOD RIGHT ANTECUBITAL Performed at [facility], [address], [place], [place]    Special Requests   Final    BOTTLES DRAWN AEROBIC AND ANAEROBIC Blood Culture results may not be optimal due to an excessive volume of blood received in culture bottles Performed at [facility], [address], [place], [place]    Culture  Setup Time   Final    GRAM POSITIVE COCCI IN BOTH AEROBIC AND ANAEROBIC BOTTLES CRITICAL RESULT CALLED TO, READ BACK BY AND VERIFIED WITH: [id number] PHARMD Janine Limbo 08/09/21 A BROWNING Performed at [facility], [address] [place], [place]    Culture STREPTOCOCCUS INTERMEDIUS (A)  Final   Report Status 08/13/2021 FINAL  Final   Organism ID, Bacteria STREPTOCOCCUS INTERMEDIUS  Final      Susceptibility   Streptococcus intermedius - MIC*    PENICILLIN <=0.06 SENSITIVE Sensitive     CEFTRIAXONE <=0.12 SENSITIVE Sensitive     ERYTHROMYCIN >=8 RESISTANT Resistant     LEVOFLOXACIN <=0.25  SENSITIVE Sensitive     VANCOMYCIN 0.25 SENSITIVE Sensitive     *  STREPTOCOCCUS INTERMEDIUS  Blood Cultures x 2 sites     Status: Abnormal   Collection Time: 08/08/21  1:07 PM   Specimen: BLOOD  Result Value Ref Range Status   Specimen Description   Final    BLOOD BLOOD RIGHT FOREARM Performed at [facility], [address], [place], [place] [address]    Special Requests   Final    BOTTLES DRAWN AEROBIC AND ANAEROBIC Blood Culture results may not be optimal due to an excessive volume of blood received in culture bottles Performed at [facility], [address] [place], [place] [address]    Culture  Setup Time   Final    GRAM POSITIVE COCCI ANAEROBIC BOTTLE ONLY CRITICAL VALUE NOTED.  VALUE IS CONSISTENT WITH PREVIOUSLY REPORTED AND CALLED VALUE.    Culture (A)  Final    STREPTOCOCCUS INTERMEDIUS SUSCEPTIBILITIES PERFORMED ON PREVIOUS CULTURE WITHIN THE LAST 5 DAYS. Performed at [facility], [address], [place], [place] [address]    Report Status 08/12/2021 FINAL  Final  Blood Culture ID Panel (Reflexed)     Status: Abnormal   Collection Time: 08/08/21  1:07 PM  Result Value Ref Range Status   Enterococcus faecalis NOT DETECTED NOT DETECTED Final   Enterococcus Faecium NOT DETECTED NOT DETECTED Final   Listeria monocytogenes NOT DETECTED NOT DETECTED Final   Staphylococcus species NOT DETECTED NOT DETECTED Final   Staphylococcus aureus (BCID) NOT DETECTED NOT DETECTED Final   Staphylococcus epidermidis NOT DETECTED NOT DETECTED Final   Staphylococcus lugdunensis NOT DETECTED NOT DETECTED Final   Streptococcus species DETECTED (A) NOT DETECTED Final    Comment: Not Enterococcus species, Streptococcus agalactiae, Streptococcus pyogenes, or Streptococcus pneumoniae. CRITICAL RESULT CALLED TO, READ BACK BY AND VERIFIED WITH: Janine Limbo PHARMD [id number] 08/09/21 A BROWNING    Streptococcus agalactiae NOT DETECTED NOT DETECTED Final    Streptococcus pneumoniae NOT DETECTED NOT DETECTED Final   Streptococcus pyogenes NOT DETECTED NOT DETECTED Final   A.calcoaceticus-baumannii NOT DETECTED NOT DETECTED Final   Bacteroides fragilis NOT DETECTED NOT DETECTED Final   Enterobacterales NOT DETECTED NOT DETECTED Final   Enterobacter cloacae complex NOT DETECTED NOT DETECTED Final   Escherichia coli NOT DETECTED NOT DETECTED Final   Klebsiella aerogenes NOT DETECTED NOT DETECTED Final   Klebsiella oxytoca NOT DETECTED NOT DETECTED Final   Klebsiella pneumoniae NOT DETECTED NOT DETECTED Final   Proteus species NOT DETECTED NOT DETECTED Final   Salmonella species NOT DETECTED NOT DETECTED Final   Serratia marcescens NOT DETECTED NOT DETECTED Final   Haemophilus influenzae NOT DETECTED NOT DETECTED Final   Neisseria meningitidis NOT DETECTED NOT DETECTED Final   Pseudomonas aeruginosa NOT DETECTED NOT DETECTED Final   Stenotrophomonas maltophilia NOT DETECTED NOT DETECTED Final   Candida albicans NOT DETECTED NOT DETECTED Final   Candida auris NOT DETECTED NOT DETECTED Final   Candida glabrata NOT DETECTED NOT DETECTED Final   Candida krusei NOT DETECTED NOT DETECTED Final   Candida parapsilosis NOT DETECTED NOT DETECTED Final   Candida tropicalis NOT DETECTED NOT DETECTED Final   Cryptococcus neoformans/gattii NOT DETECTED NOT DETECTED Final    Comment: Performed at [facility], [address] [place], [place] [address]  Resp Panel by RT-PCR (Flu A&B, Covid) Nasopharyngeal Swab     Status: None   Collection Time: 08/08/21  3:00 PM   Specimen: Nasopharyngeal Swab; Nasopharyngeal(NP) swabs in vial transport medium  Result Value Ref Range Status   SARS Coronavirus  2 by RT PCR NEGATIVE NEGATIVE Final    Comment: (NOTE) SARS-CoV-2 target nucleic acids are NOT DETECTED.  The SARS-CoV-2 RNA is generally detectable in upper respiratory specimens during the acute phase of infection. The lowest concentration of SARS-CoV-2  viral copies this assay can detect is 138 copies/mL. A negative result does not preclude SARS-Cov-2 infection and should not be used as the sole basis for treatment or other patient management decisions. A negative result may occur with  improper specimen collection/handling, submission of specimen other than nasopharyngeal swab, presence of viral mutation(s) within the areas targeted by this assay, and inadequate number of viral copies(<138 copies/mL). A negative result must be combined with clinical observations, patient history, and epidemiological information. The expected result is Negative.  Fact Sheet for Patients:  BloggerCourse.com  Fact Sheet for Healthcare Providers:  SeriousBroker.it  This test is no t yet approved or cleared by the [place] FDA and  has been authorized for detection and/or diagnosis of SARS-CoV-2 by FDA under an Emergency Use Authorization (EUA). This EUA will remain  in effect (meaning this test can be used) for the duration of the COVID-19 declaration under Section 564(b)(1) of the Act, 21 U.S.C.section 360bbb-3(b)(1), unless the authorization is terminated  or revoked sooner.       Influenza A by PCR NEGATIVE NEGATIVE Final   Influenza B by PCR NEGATIVE NEGATIVE Final    Comment: (NOTE) The Xpert Xpress SARS-CoV-2/FLU/RSV plus assay is intended as an aid in the diagnosis of influenza from Nasopharyngeal swab specimens and should not be used as a sole basis for treatment. Nasal washings and aspirates are unacceptable for Xpert Xpress SARS-CoV-2/FLU/RSV testing.  Fact Sheet for Patients: BloggerCourse.com  Fact Sheet for Healthcare Providers: SeriousBroker.it  This test is not yet approved or cleared by the [place] FDA and has been authorized for detection and/or diagnosis of SARS-CoV-2 by FDA under an Emergency Use Authorization  (EUA). This EUA will remain in effect (meaning this test can be used) for the duration of the COVID-19 declaration under Section 564(b)(1) of the Act, 21 U.S.C. section 360bbb-3(b)(1), unless the authorization is terminated or revoked.  Performed at [facility], [address], [place], [place] [address]   Aerobic/Anaerobic Culture w Gram Stain (surgical/deep wound)     Status: None   Collection Time: 08/09/21  6:18 PM   Specimen: Abscess  Result Value Ref Range Status   Specimen Description   Final    ABSCESS RIGHT BIG TOE Performed at [facility], [address], [place], [place] [address]    Special Requests   Final    NONE Performed at [facility], [address], [place], [place] [address]    Gram Stain   Final    RARE WBC PRESENT, PREDOMINANTLY MONONUCLEAR ABUNDANT GRAM POSITIVE COCCI IN PAIRS ABUNDANT GRAM POSITIVE COCCI IN CLUSTERS    Culture   Final    FEW STREPTOCOCCUS AGALACTIAE TESTING AGAINST S. AGALACTIAE NOT ROUTINELY PERFORMED DUE TO PREDICTABILITY OF AMP/PEN/VAN SUSCEPTIBILITY. MODERATE STREPTOCOCCUS INTERMEDIUS FEW PREVOTELLA BIVIA BETA LACTAMASE POSITIVE Performed at [facility], [address], [place], [place] [address]    Report Status 08/15/2021 FINAL  Final   Organism ID, Bacteria STREPTOCOCCUS INTERMEDIUS  Final      Susceptibility   Streptococcus intermedius - MIC*    PENICILLIN <=0.06 SENSITIVE Sensitive     CEFTRIAXONE <=0.12 SENSITIVE Sensitive     ERYTHROMYCIN >=8 RESISTANT Resistant     LEVOFLOXACIN <=  0.25 SENSITIVE Sensitive     VANCOMYCIN 0.25 SENSITIVE Sensitive     * MODERATE STREPTOCOCCUS INTERMEDIUS  Culture, blood (Routine X 2) w Reflex to ID Panel     Status: None (Preliminary result)   Collection Time: 08/11/21 10:37 AM   Specimen: BLOOD  Result Value Ref Range Status   Specimen Description   Final    BLOOD BLOOD RIGHT FOREARM Performed at [facility], [address], [place], [place] [address]    Special Requests   Final    BOTTLES DRAWN AEROBIC AND ANAEROBIC Blood Culture adequate volume Performed at [facility], [address] [place], [place] [address]    Culture   Final    NO GROWTH 4 DAYS Performed at [facility], [address], [place], [place] [address]    Report Status PENDING  Incomplete  Culture, blood (Routine X 2) w Reflex to ID Panel     Status: None (Preliminary result)   Collection Time: 08/11/21 10:41 AM   Specimen: BLOOD  Result Value Ref Range Status   Specimen Description   Final    BLOOD BLOOD LEFT FOREARM Performed at [facility], [address] [place], [place] [address]    Special Requests   Final    BOTTLES DRAWN AEROBIC AND ANAEROBIC Blood Culture results may not be optimal due to an excessive volume of blood received in culture bottles Performed at [facility], [address], [place], [place] [address]    Culture   Final    NO GROWTH 4 DAYS Performed at [facility], [address], [place], [place] [address]    Report Status PENDING  Incomplete          Radiology Studies: US RENAL  Result Date: 08/14/2021 CLINICAL DATA:  AK I EXAM: RENAL / URINARY TRACT ULTRASOUND COMPLETE COMPARISON:  August 08, 2021 FINDINGS: Right Kidney: Renal measurements: 11.0 x 5.8 x 5.3 cm = volume: 178 mL. Echogenicity within normal limits. No hydronephrosis. Kidney is lobular in appearance. There is a possible exophytic hypoechoic mass which measures 10 x 9 x 11 mm. There is an additional hypoechoic mass with suggestion of posterior acoustic enhancement in the lower pole which measures 15 x 24 x 18 mm. Left Kidney: Renal measurements: 10.9 x 5.5 x 4.8 cm = volume: 151 mL. Echogenicity within normal limits. No mass or hydronephrosis visualized. Kidney is lobular in appearance. Bladder: Appears normal for degree of bladder distention.  Other: Small amount of free fluid adjacent to the RIGHT kidney. IMPRESSION: 1. No hydronephrosis. 2. Indeterminate RIGHT renal masses, likely complicated cysts. Recommend follow-up evaluation with contrast-enhanced renal cyst protocol CT or MRI when clinically appropriate. 3. Small volume free fluid adjacent to the RIGHT kidney, nonspecific. Electronically Signed   By: Meda Klinefelter M.D.   On: 08/14/2021 17:01        Scheduled Meds:  amLODipine  10 mg Oral QHS   aspirin  325 mg Oral Daily   buPROPion  300 mg Oral Daily   calcium carbonate  400 mg of elemental calcium Oral BID   Chlorhexidine Gluconate Cloth  6 each Topical Daily   enoxaparin (LOVENOX) injection  60 mg Subcutaneous Q24H   insulin aspart  0-15 Units Subcutaneous TID WC   insulin aspart  0-5 Units Subcutaneous QHS   insulin aspart  8 Units Subcutaneous TID WC   insulin glargine-yfgn  15 Units Subcutaneous BID   irbesartan  300 mg Oral Daily   metoprolol tartrate  100 mg Oral BID   metroNIDAZOLE  500 mg Oral Q12H   omega-3 acid ethyl esters  1 g Oral Daily   pantoprazole  40 mg Oral Daily   senna-docusate  2 tablet Oral BID   sodium chloride flush  10-40 mL Intracatheter Q12H   Vitamin D (Ergocalciferol)  50,000 Units Oral Q7 days   Continuous Infusions:  penicillin g continuous IV infusion 9 Million Units (08/15/21 0935)     LOS: 7 days        Kathlen Mody, MD [facility]   To contact the attending provider between 7A-7P or the covering provider during after hours 7P-7A, please log into the web site [email or web address] and access using universal [place] password for that web site. If you do not have the password, please call the hospital operator.  08/15/2021, 1:52 PM

## 2021-08-15 NOTE — Evaluation (Signed)
Occupational Therapy Evaluation Patient Details Name: [name] [name] MRN: [id number] DOB: [birth year] Today's Date: 08/15/2021   History of Present Illness Pt s/p R great toe amputation 08/09/21 and R foot I&D 08/13/21.  Pt with hx of CAD, CABG, DM and "bad L knee"  - pt states he can not rely on L knee to support him.   Clinical Impression   Patient is a 65 year old male who was admitted for above. Patient was living at home with wife at cane level prior. Currently, patient requires increased assistance to maintain NWB status with participation in ADLs. Patient was noted to have decreased activity tolerance, decreased endurance, decreased ability to maintain NWB status during ADLs, decreased knowledge of AE/AD impacting patients ability to engage in ADLs. Patient would continue to benefit from skilled OT services at this time while admitted and after d/c to address noted deficits in order to improve overall safety and independence in ADLs.        Recommendations for follow up therapy are one component of a multi-disciplinary discharge planning process, led by the attending physician.  Recommendations may be updated based on patient status, additional functional criteria and insurance authorization.   Follow Up Recommendations  Home health OT    Equipment Recommendations  Wheelchair (measurements OT);Other (comment) (drop arm commode seat)    Recommendations for Other Services       Precautions / Restrictions Precautions Precautions: Fall Required Braces or Orthoses: Other Brace Other Brace: Darko shoe for R foot in orders but not in room at this time. Restrictions Weight Bearing Restrictions: Yes RLE Weight Bearing: Non weight bearing      Mobility Bed Mobility Overal bed mobility: Needs Assistance Bed Mobility: Supine to Sit     Supine to sit: Min guard     General bed mobility comments: with increased time and bed rail use.    Transfers Overall transfer level: Needs  assistance   Transfers: Lateral/Scoot Transfers          Lateral/Scoot Transfers: Mod assist General transfer comment: patient unable to clear bottom to get into standing withbed raised to increased height and RW. patient lateral scoot to recliner with arm lowered. patient required assistance to keep RLE off the floor to maintain NWB status    Balance                                           ADL either performed or assessed with clinical judgement   ADL Overall ADL's : Needs assistance/impaired Eating/Feeding: Sitting;Set up   Grooming: Sitting;Wash/dry face;Set up   Upper Body Bathing: Sitting;Set up   Lower Body Bathing: Sitting/lateral leans;Moderate assistance   Upper Body Dressing : Set up;Sitting   Lower Body Dressing: Sitting/lateral leans;Moderate assistance Lower Body Dressing Details (indicate cue type and reason): patient was noted to be able to reach feet long sitting in bed with pain in L knee with attempts to don sock and shoe Toilet Transfer: +2 for physical assistance;Moderate assistance Toilet Transfer Details (indicate cue type and reason): patient required mod A x2 for scoot transfer from edge of bed to recliner in room with continued education during session on how to particiapte in scoot transfer after patietn attempted to scoot to recliner when not close enough. patient required physical assistance to keep RLE off the floor during movements. Toileting- Clothing Manipulation and Hygiene: Maximal assistance;Sitting/lateral lean  Vision Patient Visual Report: No change from baseline       Perception     Praxis      Pertinent Vitals/Pain Pain Assessment: 0-10 Pain Score: 4  Pain Location: R foot Pain Descriptors / Indicators: Aching Pain Intervention(s): Limited activity within patient's tolerance;Monitored during session     Hand Dominance Right   Extremity/Trunk Assessment Upper Extremity Assessment Upper  Extremity Assessment: Overall WFL for tasks assessed   Lower Extremity Assessment Lower Extremity Assessment: Defer to PT evaluation   Cervical / Trunk Assessment Cervical / Trunk Assessment: Normal   Communication Communication Communication: No difficulties   Cognition Arousal/Alertness: Awake/alert Behavior During Therapy: WFL for tasks assessed/performed Overall Cognitive Status: Within Functional Limits for tasks assessed                                     General Comments       Exercises     Shoulder Instructions      Home Living Family/patient expects to be discharged to:: Private residence Living Arrangements: Spouse/significant other Available Help at Discharge: Family;Available 24 hours/day Type of Home: House Home Access: Stairs to enter [employer] of Steps: 2   Home Layout: One level               Home Equipment: Walker - 4 wheels;Cane - single point          Prior Functioning/Environment Level of Independence: Independent with assistive device(s)        Comments: using cane        OT Problem List: Decreased strength;Impaired balance (sitting and/or standing);Decreased activity tolerance;Decreased safety awareness;Decreased knowledge of precautions      OT Treatment/Interventions: Self-care/ADL training;DME and/or AE instruction;Therapeutic activities;Balance training;Patient/family education    OT Goals(Current goals can be found in the care plan section) Acute Rehab OT Goals Patient Stated Goal: HOME with spouse OT Goal Formulation: With patient Time For Goal Achievement: 08/29/21 Potential to Achieve Goals: Good  OT Frequency: Min 2X/week   Barriers to D/C:    patients ability to participate in ADLs while maintaining NWB status       Co-evaluation PT/OT/SLP Co-Evaluation/Treatment: Yes Reason for Co-Treatment: To address functional/ADL transfers PT goals addressed during session: Mobility/safety with  mobility OT goals addressed during session: ADL's and self-care      AM-PAC OT "6 Clicks" Daily Activity     Outcome Measure Help from another person eating meals?: A Little Help from another person taking care of personal grooming?: A Little Help from another person toileting, which includes using toliet, bedpan, or urinal?: A Lot Help from another person bathing (including washing, rinsing, drying)?: A Lot Help from another person to put on and taking off regular upper body clothing?: A Little Help from another person to put on and taking off regular lower body clothing?: A Lot 6 Click Score: 15   End of Session Equipment Utilized During Treatment: Gait belt Nurse Communication: Other (comment) (nurse cleared patient to participate)  Activity Tolerance: Patient tolerated treatment well Patient left: in chair;with call bell/phone within reach  OT Visit Diagnosis: Muscle weakness (generalized) (M62.81);Pain Pain - Right/Left: Right Pain - part of body: Ankle and joints of foot                Time: 9892-1194 OT Time Calculation (min): 28 min Charges:  OT General Charges $OT Visit: 1 Visit OT Evaluation $OT Eval Moderate Complexity:  1 Mod  Sharyn Blitz OTR/L, [place] Acute Rehabilitation Department Office# [phone] Pager# [phone]   Chalmers Guest Yoshiaki Kreuser 08/15/2021, 2:12 PM

## 2021-08-15 NOTE — Progress Notes (Signed)
Pt declined nocturnal cpap.  Pt was advised RT is available all night should he change his mind.

## 2021-08-15 NOTE — Consult Note (Signed)
Reason for Consult: Hyponatremia, acute kidney injury Referring Physician: Kathlen Mody MD [facility])  HPI: 65 year old man with a history of hypertension, type 2 diabetes mellitus, dyslipidemia, coronary artery disease status post 5 vessel CABG, GERD, urethral strictures status post dilation/stent and chronic kidney disease stage IIIa at baseline (creatinine 1.2-1.5) who was admitted to the hospital with left lower extremity cellulitis along with right hallux pain.  He has had problems with nonhealing diabetic foot ulcers-consequent evaluation revealed osteomyelitis for which he had partial first ray amputation of the right foot, second toe amputation and deep right foot incision and drainage on 9/20 and then again incision and drainage of right foot wound on 9/24.  He had 20 mg IV furosemide given on 9/21 and 9/22 and hydrochlorothiazide 25 mg on 9/20 and 9/21 along with irbesartan 300 mg started on 9/20.  He got and a single 200 mg dose of celecoxib on 9/20.  His sodium level dropped from 135 on 9/20 to 126 on 9/22 and concern is raised because it has continued to trend down and is now 122.  He remains on Wellbutrin XL 300 mg daily and is not on any nonsteroidal anti-inflammatory drugs or hydrochlorothiazide at this time.  Past Medical History:  Diagnosis Date   Anxiety    Bulbous urethral stricture    chronic--- hx multiple dilatation's and urolume stent's   CAD (coronary artery disease) 04/ 2011  (previous cardiologist-  dr h. Katrinka Blazing , lov 06/ 2016)  currently followed by pcp   03-03-2010  CABG x 5, LIMA to LAD, left radial artery to OM1,SVG to R1, seqSVG to PDA and PLA   ED (erectile dysfunction)    Essential hypertension, benign    GERD (gastroesophageal reflux disease)    History of pancreatitis 2005   Mixed hyperlipidemia    OSA on CPAP    S/P CABG x 5 03/03/2010   Type II or unspecified type diabetes mellitus without mention of complication, not stated as uncontrolled    endocrinologist-   dr Sharl Ma    Past Surgical History:  Procedure Laterality Date   AMPUTATION Right 08/09/2021   Procedure: AMPUTATION DIGIT;  Surgeon: Vivi Barrack, DPM;  Location: [facility];  Service: Podiatry;  Laterality: Right;   CARDIAC CATHETERIZATION  03-01-2010   dr Verdis Prime   nuclear stress test --positive moderate ishemia:  severe multivessel CAD w/ total occlusion of pLAD, HG obstrucion of pPDA with supplies collaterals around the apex to the LAD, severe stenosis distal RCA, total occlusion OM1, 75% D1, normal LVF    CORONARY ARTERY BYPASS GRAFT  03-03-2010  dr hendrickson    LIMA to LAD, SVG to RI, left radial artery to OM1, seqSVG  to PDA and PLA   CYSTO W/ DILATATION , LASER INCISION OF URETHRAL STRICTURE  02-25-2005   dr Vernie Ammons  [facility]   CYSTO/  DILATION URETHRAL MEATAL STENOSIS AND DILATION BULBOUS STRICTURE  12-21-2004   dr Vonita Moss  [facility]   CYSTO/ RETROGRADE PYELOGRAM/ DILATATION URETHRAL STRICTURE AND PLACEMENT UROLUME STENT  08-04-2011    dr Vernie Ammons  [facility]   CYSTOSCOPY WITH DIRECT VISION INTERNAL URETHROTOMY  07/15/2008   dr Vernie Ammons  [facility]   and UroLume Stent placement   CYSTOSCOPY WITH URETHRAL DILATATION N/A 04/19/2018   Procedure: CYSTOSCOPY WITH BALLOON URETHRAL DILATATION;  Surgeon: Ihor Gully, MD;  Location: [facility] [place];  Service: Urology;  Laterality: N/A;   INCISION AND DRAINAGE Right 08/13/2021   Procedure: INCISION AND DRAINAGE RIGHT FOOT WOUND;  Surgeon:  Vivi Barrack, DPM;  Location: [facility];  Service: Podiatry;  Laterality: Right;   ORIF TOE FRACTURE Left 05/22/2013   Procedure: OPEN REDUCTION INTERNAL FIXATION (ORIF) LEFT SECOND TOE PROXIMAL PHALANX FRACTURE/LEFT PERCUTANEOUS PINNING;  Surgeon: Toni Arthurs, MD;  Location: [facility];  Service: Orthopedics;  Laterality: Left;   TONSILLECTOMY  child    Family History  Problem Relation Age of Onset   Cancer Mother        lung   Hypertension Father    CAD Father    CVA Brother     Social History:  reports  that he has never smoked. He has never used smokeless tobacco. He reports current alcohol use. He reports that he does not use drugs.  Allergies: No Known Allergies  Medications: I have reviewed the patient's current medications. Scheduled:  amLODipine  10 mg Oral QHS   aspirin  325 mg Oral Daily   buPROPion  300 mg Oral Daily   calcium carbonate  400 mg of elemental calcium Oral BID   Chlorhexidine Gluconate Cloth  6 each Topical Daily   enoxaparin (LOVENOX) injection  60 mg Subcutaneous Q24H   insulin aspart  0-15 Units Subcutaneous TID WC   insulin aspart  0-5 Units Subcutaneous QHS   insulin aspart  8 Units Subcutaneous TID WC   insulin glargine-yfgn  15 Units Subcutaneous BID   irbesartan  300 mg Oral Daily   metoprolol tartrate  100 mg Oral BID   metroNIDAZOLE  500 mg Oral Q12H   omega-3 acid ethyl esters  1 g Oral Daily   pantoprazole  40 mg Oral Daily   senna-docusate  2 tablet Oral BID   sodium chloride flush  10-40 mL Intracatheter Q12H   Vitamin D (Ergocalciferol)  50,000 Units Oral Q7 days    BMP Latest Ref Rng & Units 08/15/2021 08/14/2021 08/12/2021  Glucose 70 - 99 mg/dL 751(W) 258(N) 277(O)  BUN 8 - 23 mg/dL 24(M) 35(T) 61(W)  Creatinine 0.61 - 1.24 mg/dL 4.31 5.40(G) 8.67(Y)  Sodium 135 - 145 mmol/L 122(L) 123(L) 127(L)  Potassium 3.5 - 5.1 mmol/L 4.9 5.3(H) 5.0  Chloride 98 - 111 mmol/L 88(L) 90(L) 94(L)  CO2 22 - 32 mmol/L 24 25 24   Calcium 8.9 - 10.3 mg/dL 9.0 9.6 9.5   CBC Latest Ref Rng & Units 08/15/2021 08/14/2021 08/12/2021  WBC 4.0 - 10.5 K/uL 29.8(H) 28.7(H) 29.1(H)  Hemoglobin 13.0 - 17.0 g/dL 10.7(L) 11.1(L) 12.3(L)  Hematocrit 39.0 - 52.0 % 31.5(L) 32.5(L) 35.5(L)  Platelets 150 - 400 K/uL 344 336 287    08/14/2021 RENAL  Result Date: 08/14/2021 CLINICAL DATA:  AK I EXAM: RENAL / URINARY TRACT ULTRASOUND COMPLETE COMPARISON:  August 08, 2021 FINDINGS: Right Kidney: Renal measurements: 11.0 x 5.8 x 5.3 cm = volume: 178 mL. Echogenicity within normal  limits. No hydronephrosis. Kidney is lobular in appearance. There is a possible exophytic hypoechoic mass which measures 10 x 9 x 11 mm. There is an additional hypoechoic mass with suggestion of posterior acoustic enhancement in the lower pole which measures 15 x 24 x 18 mm. Left Kidney: Renal measurements: 10.9 x 5.5 x 4.8 cm = volume: 151 mL. Echogenicity within normal limits. No mass or hydronephrosis visualized. Kidney is lobular in appearance. Bladder: Appears normal for degree of bladder distention. Other: Small amount of free fluid adjacent to the RIGHT kidney. IMPRESSION: 1. No hydronephrosis. 2. Indeterminate RIGHT renal masses, likely complicated cysts. Recommend follow-up evaluation with contrast-enhanced renal cyst protocol CT or  MRI when clinically appropriate. 3. Small volume free fluid adjacent to the RIGHT kidney, nonspecific. Electronically Signed   By: Meda Klinefelter M.D.   On: 08/14/2021 17:01    Review of Systems  Constitutional:  Positive for chills and fatigue. Negative for fever.  HENT:  Negative for nosebleeds, sinus pressure and sinus pain.   Eyes:  Negative for pain, redness and visual disturbance.  Respiratory:  Negative for cough, shortness of breath and wheezing.   Cardiovascular:  Positive for leg swelling. Negative for chest pain.       Mild leg swelling  Gastrointestinal:  Positive for nausea. Negative for diarrhea and vomiting.       Transient nausea postoperatively  Endocrine: Negative.   Genitourinary: Negative.   Musculoskeletal:  Negative for myalgias, neck pain and neck stiffness.  Skin:  Positive for wound.       Right foot  Neurological:  Negative for dizziness, speech difficulty and headaches.  Blood pressure 132/74, pulse 62, temperature (!) 97.4 F (36.3 C), temperature source Oral, resp. rate 16, height 6' (1.829 m), weight 118.6 kg, SpO2 100 %. Physical Exam Vitals and nursing note reviewed.  Constitutional:      Appearance: He is obese.      Comments: Appears comfortable sitting up in recliner  HENT:     Head: Normocephalic and atraumatic.     Right Ear: External ear normal.     Left Ear: External ear normal.     Nose: Nose normal.     Mouth/Throat:     Mouth: Mucous membranes are moist.     Pharynx: Oropharynx is clear.  Eyes:     General: No scleral icterus.    Extraocular Movements: Extraocular movements intact.     Conjunctiva/sclera: Conjunctivae normal.     Pupils: Pupils are equal, round, and reactive to light.  Cardiovascular:     Rate and Rhythm: Normal rate and regular rhythm.     Pulses: Normal pulses.     Heart sounds: Normal heart sounds.  Pulmonary:     Effort: Pulmonary effort is normal.     Breath sounds: Normal breath sounds. No rales.  Abdominal:     General: Abdomen is flat.     Palpations: Abdomen is soft.     Tenderness: There is no abdominal tenderness.  Musculoskeletal:     Cervical back: Normal range of motion and neck supple.     Right lower leg: Edema present.     Comments: Trace edema around ankles/lower leg, right leg in Ace wrap with wound drain (sanguinous)  Skin:    General: Skin is warm and dry.  Neurological:     Mental Status: He is alert and oriented to person, place, and time. Mental status is at baseline.  Psychiatric:        Mood and Affect: Mood normal.    Assessment/Plan: 1.  Hyponatremia: True hyponatremia with hypo-osmolality and inappropriately elevated urine osmolality reflective of inappropriate ADH activation.  Whereas I suspect that this was exacerbated by thiazide use, continued worsening after discontinuation of this medication raises concern for drug-induced SIADH associated with Wellbutrin.  I will transiently discontinue his irbesartan as this is likely interfering with correction of hyponatremia.  He may also have a mild element of non-osmotically mediated ADH release from right foot pain and postoperative nausea.  Discussed fluid restriction <1.2 L a day. 2.   Chronic kidney disease: With intermittent acute kidney injury that appears to be hemodynamically mediated based on episodes of relative  hypotension and timing of surgery.  Do not restart celecoxib or use alternative nonsteroidal anti-inflammatory drugs.  Avoid iodinated intravenous contrast unless needed for lifesaving procedure. 3.  Left lower extremity cellulitis/abscess/osteomyelitis: Status post partial first ray amputation along with second toe amputation and drainage of abscess by podiatry.  He remains on antibiotic therapy with penicillin and Flagyl with slight worsening of leukocytosis noted this morning. 4.  Anemia: Likely related to chronic nonhealing foot ulcers and recent surgery, continue to monitor with overt losses from wound drain.  Suhan Paci K. 08/15/2021, 2:06 PM

## 2021-08-15 NOTE — Progress Notes (Signed)
Orthopedic Tech Progress Note Patient Details:  [name] [name] Feb 17, 1956 [id number]  Ortho Devices Type of Ortho Device: Darco shoe Ortho Device/Splint Location: right Ortho Device/Splint Interventions: Application   Post Interventions Patient Tolerated: Well Instructions Provided: Care of device  [name] 08/15/2021, 12:46 PM

## 2021-08-16 DIAGNOSIS — L03116 Cellulitis of left lower limb: Secondary | ICD-10-CM | POA: Diagnosis not present

## 2021-08-16 DIAGNOSIS — M869 Osteomyelitis, unspecified: Secondary | ICD-10-CM | POA: Diagnosis not present

## 2021-08-16 DIAGNOSIS — R7881 Bacteremia: Secondary | ICD-10-CM | POA: Diagnosis not present

## 2021-08-16 LAB — URINALYSIS, ROUTINE W REFLEX MICROSCOPIC
Bilirubin Urine: NEGATIVE
Glucose, UA: NEGATIVE mg/dL
Hgb urine dipstick: NEGATIVE
Ketones, ur: NEGATIVE mg/dL
Leukocytes,Ua: NEGATIVE
Nitrite: NEGATIVE
Protein, ur: NEGATIVE mg/dL
Specific Gravity, Urine: 1.015 (ref 1.005–1.030)
pH: 6 (ref 5.0–8.0)

## 2021-08-16 LAB — CBC WITH DIFFERENTIAL/PLATELET
Abs Immature Granulocytes: 2.29 10*3/uL — ABNORMAL HIGH (ref 0.00–0.07)
Basophils Absolute: 0.2 10*3/uL — ABNORMAL HIGH (ref 0.0–0.1)
Basophils Relative: 1 %
Eosinophils Absolute: 0.2 10*3/uL (ref 0.0–0.5)
Eosinophils Relative: 1 %
HCT: 31.9 % — ABNORMAL LOW (ref 39.0–52.0)
Hemoglobin: 10.8 g/dL — ABNORMAL LOW (ref 13.0–17.0)
Immature Granulocytes: 8 %
Lymphocytes Relative: 8 %
Lymphs Abs: 2.4 10*3/uL (ref 0.7–4.0)
MCH: 28.8 pg (ref 26.0–34.0)
MCHC: 33.9 g/dL (ref 30.0–36.0)
MCV: 85.1 fL (ref 80.0–100.0)
Monocytes Absolute: 2.1 10*3/uL — ABNORMAL HIGH (ref 0.1–1.0)
Monocytes Relative: 7 %
Neutro Abs: 22.1 10*3/uL — ABNORMAL HIGH (ref 1.7–7.7)
Neutrophils Relative %: 75 %
Platelets: 445 10*3/uL — ABNORMAL HIGH (ref 150–400)
RBC: 3.75 MIL/uL — ABNORMAL LOW (ref 4.22–5.81)
RDW: 13.5 % (ref 11.5–15.5)
WBC: 29.2 10*3/uL — ABNORMAL HIGH (ref 4.0–10.5)
nRBC: 0 % (ref 0.0–0.2)

## 2021-08-16 LAB — RENAL FUNCTION PANEL
Albumin: 2.1 g/dL — ABNORMAL LOW (ref 3.5–5.0)
Anion gap: 9 (ref 5–15)
BUN: 35 mg/dL — ABNORMAL HIGH (ref 8–23)
CO2: 24 mmol/L (ref 22–32)
Calcium: 9.6 mg/dL (ref 8.9–10.3)
Chloride: 91 mmol/L — ABNORMAL LOW (ref 98–111)
Creatinine, Ser: 0.94 mg/dL (ref 0.61–1.24)
GFR, Estimated: >60 mL/min (ref >60)
Glucose, Bld: 160 mg/dL — ABNORMAL HIGH (ref 70–99)
Phosphorus: 4.7 mg/dL — ABNORMAL HIGH (ref 2.5–4.6)
Potassium: 4.9 mmol/L (ref 3.5–5.1)
Sodium: 124 mmol/L — ABNORMAL LOW (ref 135–145)

## 2021-08-16 LAB — GLUCOSE, CAPILLARY
Glucose-Capillary: 164 mg/dL — ABNORMAL HIGH (ref 70–99)
Glucose-Capillary: 198 mg/dL — ABNORMAL HIGH (ref 70–99)
Glucose-Capillary: 114 mg/dL — ABNORMAL HIGH (ref 70–99)
Glucose-Capillary: 84 mg/dL (ref 70–99)

## 2021-08-16 LAB — BASIC METABOLIC PANEL
Anion gap: 9 (ref 5–15)
BUN: 36 mg/dL — ABNORMAL HIGH (ref 8–23)
CO2: 24 mmol/L (ref 22–32)
Calcium: 9.8 mg/dL (ref 8.9–10.3)
Chloride: 91 mmol/L — ABNORMAL LOW (ref 98–111)
Creatinine, Ser: 0.99 mg/dL (ref 0.61–1.24)
GFR, Estimated: >60 mL/min (ref >60)
Glucose, Bld: 158 mg/dL — ABNORMAL HIGH (ref 70–99)
Potassium: 5 mmol/L (ref 3.5–5.1)
Sodium: 124 mmol/L — ABNORMAL LOW (ref 135–145)

## 2021-08-16 LAB — CULTURE, BLOOD (ROUTINE X 2)
Culture: NO GROWTH
Culture: NO GROWTH
Special Requests: ADEQUATE

## 2021-08-16 LAB — PROCALCITONIN: Procalcitonin: 0.2 ng/mL

## 2021-08-16 LAB — C-REACTIVE PROTEIN: CRP: 14.8 mg/dL — ABNORMAL HIGH (ref <1.0)

## 2021-08-16 MED ORDER — MORPHINE SULFATE (PF) 2 MG/ML IV SOLN
1.0000 mg | INTRAVENOUS | Status: DC | PRN
  Administered 2021-08-16 – 2021-08-19 (×4): 1 mg via INTRAVENOUS
  Filled 2021-08-16 (×5): qty 1

## 2021-08-16 MED ORDER — FUROSEMIDE 20 MG PO TABS
20.0000 mg | ORAL_TABLET | Freq: Every day | ORAL | Status: DC
  Administered 2021-08-16: 20 mg via ORAL
  Filled 2021-08-16: qty 1

## 2021-08-16 NOTE — Op Note (Signed)
PATIENT:  [name] [name]  65 y.o. male   PRE-OPERATIVE DIAGNOSIS:  Abscess   POST-OPERATIVE DIAGNOSIS:  Abscess   PROCEDURE:  Procedure(s): INCISION AND DRAINAGE RIGHT FOOT WOUND (Right)   SURGEON:  Surgeon(s) and Role:    Trula Slade, DPM - Primary   PHYSICIAN ASSISTANT:    ASSISTANTS: none    ANESTHESIA:   MAC   EBL:  200 mL    BLOOD ADMINISTERED:none   DRAINS: none    LOCAL MEDICATIONS USED:  OTHER 10 cc lidocaine and marcaine plain   SPECIMEN:  No Specimen   DISPOSITION OF SPECIMEN:  N/A   COUNTS:  YES   TOURNIQUET:  * Missing tourniquet times found for documented tourniquets in log: [id number] *   DICTATION: .Dragon Dictation   PLAN OF CARE: Admit to inpatient    PATIENT DISPOSITION:  PACU - hemodynamically stable.   Delay start of Pharmacological VTE agent (>24hrs) due to surgical blood loss or risk of bleeding: no   Indications for surgery: 65 year old male was admitted for worsening wounds, infection of his right big toe.  He had an MRI performed which revealed osteomyelitis as well as multiple abscesses.  He originally went to the operating for partial first ray amputation, I&D of the abscesses.  At the inclusion of the procedure no further purulence.  The wound was loosely closed and packed with Iodosorb packing.  He did well however white blood cell count increased.  Due to this recommended return to the operating room for further debridement, washout of the wound, I&D.  Patient and his wife are in agreement to this.  All alternatives risks and complications were discussed.  No promises or guarantees given.  Unfortunately I have discussed with the patient that should infection continue he may end up needing a transmetatarsal amputation.  He is aware that this is a possibility but we will try to save the rest of the foot.  Procedure in detail: The patient was both verbally and visually identified in the preoperative area.  He was then transferred to the  operating room via stretcher and placed on the operating table in the supine position.  After adequate plane of anesthesia was obtained a pneumatic calf tourniquet was placed on the right lower extremity.  Of note this was not inflated during the procedure.  The right lower extremities and scrubbed, prepped, draped in the normal sterile fashion.  Timeout was performed.  Attention was directed along the previous surgery which the sutures were removed.  Upon removal there was purulence noted in the dorsal portion of the incision.  On the partial first amputation site I utilized to puncture remove any nonviable devitalized tissue.  Hemostat was utilized to probe proximally and there is no further purulence noted from this area.  A secondary incision was made along the second third metatarsal dorsally.  Small mount of purulence was noted to this area.  I can utilize a rongeur removing nonviable tissue.  Hemostat was utilized to make sure no proximal tracking.  It did not appear to be growing underneath tendons or muscle as I did look at this area and this Does appear to be superficial.  At this time upon further inspection no further purulence found.  I copiously irrigated the wound with 3 L of saline.  Hemostasis was achieved.  On the dorsal incision line.  Loosely closed this so it would allow for any drainage.  At this time the partial first amputation site was closed and a  JP drain was placed.  A bulky sterile dressing was then applied.  He was woke from anesthesia and found to tolerate the procedure well and complications.  Transferred to PACU vital signs stable and vascular status intact.  Patient remained inpatient IV antibiotics with close monitoring.

## 2021-08-16 NOTE — Progress Notes (Signed)
PROGRESS NOTE    [name] [name]  ZOX:[id number] DOB: 01/18/1956 DOA: 08/08/2021 PCP: Tally Joe, MD    Chief Complaint  Patient presents with   Wound Infection    Brief Narrative:  65 year old gentleman prior history of hypertension, type 2 diabetes, hyperlipidemia presents to the hospital with left lower extremity cellulitis as well as right great toe nonhealing wound with pain and tenderness.  Patient reports that over the last 1 week prior to admission patient had worsening erythema pain and foul-smelling discharge from the right toe.  He follows up with wound care.  Podiatry and ID were consulted and patient underwent partial first ray amputation of the right foot, partial second toe amputation and debridement of the deep abscess in the right foot by Dr. Loreta Ave on 08/09/2021 and repeat debridement. ID on board, recommended 4 weeks IV Penicillin and oral flagyl.  Pt would be stable for discharge when his sodium improves.   Assessment & Plan:   Active Problems:   Toe infection   Cellulitis of right lower extremity   Osteomyelitis of great toe of right foot (HCC)   Osteomyelitis of second toe of right foot (HCC)   Diabetes mellitus due to underlying condition with diabetic autonomic neuropathy, without long-term current use of insulin (HCC)   Class 3 severe obesity with serious comorbidity and body mass index (BMI) of 45.0 to 49.9 in adult [facility])   Elevated LFTs   Post-operative state   Cellulitis of left lower extremity   Streptococcal bacteremia   Right foot ulcer, MRI on admission of the right foot was concerning for septic arthritis and osteomyelitis of the first proximal and distal phalanges as well as osteomyelitis at the second distal phalanx. It also showed infectious tenosynovitis around the first metatarsal and multiple forefoot abscesses.  Podiatry consulted and he underwent partial first ray amputation, partial second toe amputation and debridement of the deep abscess  of the right foot by Dr. Loreta Ave on 08/09/2021. In view of worsening leukocytosis, He underwent repeat debridement of the right foot ON 08/13/21.  ID on board and appreciate recommendations. He was started on IV rocephin, transitioned to IV penicillin and flagyl to complete 4 weeks.  MRI of the left foot does not show any osteomyelitis. Cultures from the right foot show streptococcus intermedius sensitive to penicillin.  Therapy evaluations ordered and recommended home health PT/OT.    Streptococcus intermedius bacteremia: from cultures drawn on 08/08/21 Possibly from diabetic foot infection.  Echocardiogram does not show any evidence of valvular vegetations.  Blood cultures are repeated have been Negative so far,  PICC line to be placed.  ID recommended 4 weeks of IV penicillin and flagyl.  Pt is afebrile ,but has persistent leukocytosis, in view of persistent leukocytosis, he underwent a CT abd and pelvis , doe snot show any abscess.  He was found to have Irregular circumferential urinary bladder wall thickening probably chronic.  Pt reports that he has h/o urinary retention, self catheterizes every once in a while also reports having a ureteral stent placed in more than 15 years ago Urine cytology ordered.  UA and urine cultures are pending.   Left lower extremity cellulitis Improved.    Elevated liver enzymes patient currently denies any nausea and vomiting, continue to monitor.  Right upper quadrant ultrasound showed cholelithiasis.  Patient is currently asymptomatic.   Stage IIIa CKD Baseline creatinine around 1.5-1.7 Creatinine of 1.53.     Type 2 diabetes mellitus with hyperglycemia and with CKD Continue with sliding  scale insulin. CBG (last 3)  Recent Labs    08/15/21 2103 08/16/21 0825 08/16/21 1336  GLUCAP 123* 164* 198*   Last hemoglobin A1c around 6.2 Patient is on Ozempic at home which is on hold.  Increase in Semglee 15 units BID, increase novolog 8 units  TIDAC. Cbg's are better today.    Hyperlipidemia Statin on hold due to elevated liver enzymes.   Hyponatremia:  Secondary to SIADH.  TSH, cortisol, urine sodium and serum osmo ordered for further eval.  Serum osmo is 288. Cortisol is 7.1, urine sodium is 63, TSH is wnl.  NEPHROLOGY consulted and he was started on lasix and fluid restriction.    Hypertension:  Better controlled.  Resume amlodipine and metoprolol.    Hyperkalemia:  Resolved.  Avapro discontinued.      DVT prophylaxis: (Lovenox) Code Status: (Full code) Family Communication: (None at bedside).  Disposition:   Status is: Inpatient  Remains inpatient appropriate because:Ongoing diagnostic testing needed not appropriate for outpatient work up and IV treatments appropriate due to intensity of illness or inability to take PO  Dispo: The patient is from: Home              Anticipated d/c is to: Home              Patient currently is not medically stable to d/c.   Difficult to place patient No       Consultants:  Podiatry  ID.   Procedures:  Partial 1st ray amputation right foot; partial 2nd toe amputation, I&D deep abscess right foot.  by dr Ardelle Anton on 08/09/21  Incision and drainage of the right foot wound on 08/13/21.  Antimicrobials:  Antibiotics Given (last 72 hours)     Date/Time Action Medication Dose Rate   08/13/21 2135 Given   metroNIDAZOLE (FLAGYL) tablet 500 mg 500 mg    08/14/21 0011 New Bag/Given   penicillin G potassium 9 Million Units in dextrose 5 % 500 mL continuous infusion 9 Million Units 41.7 mL/hr   08/14/21 1011 Given   metroNIDAZOLE (FLAGYL) tablet 500 mg 500 mg    08/14/21 1150 New Bag/Given   penicillin G potassium 9 Million Units in dextrose 5 % 500 mL continuous infusion 9 Million Units 41.7 mL/hr   08/14/21 2137 Given   metroNIDAZOLE (FLAGYL) tablet 500 mg 500 mg    08/14/21 2332 New Bag/Given   penicillin G potassium 9 Million Units in dextrose 5 % 500 mL  continuous infusion 9 Million Units 41.7 mL/hr   08/15/21 0931 Given   metroNIDAZOLE (FLAGYL) tablet 500 mg 500 mg    08/15/21 0935 New Bag/Given   penicillin G potassium 9 Million Units in dextrose 5 % 500 mL continuous infusion 9 Million Units 41.7 mL/hr   08/15/21 2148 Given   metroNIDAZOLE (FLAGYL) tablet 500 mg 500 mg    08/15/21 2151 New Bag/Given   penicillin G potassium 9 Million Units in dextrose 5 % 500 mL continuous infusion 9 Million Units 41.7 mL/hr   08/16/21 0934 Given   metroNIDAZOLE (FLAGYL) tablet 500 mg 500 mg    08/16/21 1108 New Bag/Given   penicillin G potassium 9 Million Units in dextrose 5 % 500 mL continuous infusion 9 Million Units 41.7 mL/hr         Subjective: No new complaints.   Objective: Vitals:   08/15/21 1331 08/15/21 2106 08/16/21 0557 08/16/21 1338  BP: 132/74 138/71 124/69 119/67  Pulse: 62 77 69 (!) 54  Resp: 16 18  18 16  Temp: (!) 97.4 F (36.3 C) (!) 97.4 F (36.3 C) 98 F (36.7 C)   TempSrc: Oral Oral Oral   SpO2: 100% 97% 96% 99%  Weight:      Height:        Intake/Output Summary (Last 24 hours) at 08/16/2021 1448 Last data filed at 08/16/2021 1339 Gross per 24 hour  Intake 240 ml  Output 2300 ml  Net -2060 ml    Filed Weights   08/08/21 1149 08/08/21 1738 08/09/21 1625  Weight: 112.9 kg 118.6 kg 118.6 kg    Examination:  General exam: Appears calm and comfortable  Respiratory system: Clear to auscultation. Respiratory effort normal. Cardiovascular system: S1 & S2 heard, RRR. No JVD,  No pedal edema. Gastrointestinal system: Abdomen is nondistended, soft and non tender. Normal bowel sounds heard. Central nervous system: Alert and oriented. No focal neurological deficits. Extremities: trace pedal edema.  Skin: right foot ulcer bandaged with drain.  Psychiatry: Mood & affect appropriate.      Data Reviewed: I have personally reviewed following labs and imaging studies  CBC: Recent Labs  Lab 08/11/21 0846  08/12/21 0948 08/14/21 1303 08/15/21 0321 08/16/21 0538  WBC 25.2* 29.1* 28.7* 29.8* 29.2*  NEUTROABS 20.7* 22.1* 22.7*  --  22.1*  HGB 12.1* 12.3* 11.1* 10.7* 10.8*  HCT 35.6* 35.5* 32.5* 31.5* 31.9*  MCV 84.8 84.1 85.3 85.8 85.1  PLT 245 287 336 344 445*     Basic Metabolic Panel: Recent Labs  Lab 08/12/21 0459 08/14/21 1303 08/15/21 0321 08/16/21 0538 08/16/21 0548  NA 127* 123* 122* 124* 124*  K 5.0 5.3* 4.9 5.0 4.9  CL 94* 90* 88* 91* 91*  CO2 24 25 24 24 24   GLUCOSE 221* 347* 180* 158* 160*  BUN 32* 39* 39* 36* 35*  CREATININE 1.26* 1.53* 1.24 0.99 0.94  CALCIUM 9.5 9.6 9.0 9.8 9.6  PHOS  --   --   --   --  4.7*     GFR: Estimated Creatinine Clearance: 106.9 mL/min (by C-G formula based on SCr of 0.94 mg/dL).  Liver Function Tests: Recent Labs  Lab 08/14/21 1303 08/16/21 0548  AST 34  --   ALT 45*  --   ALKPHOS 178*  --   BILITOT 1.2  --   PROT 7.0  --   ALBUMIN 2.2* 2.1*     CBG: Recent Labs  Lab 08/15/21 1134 08/15/21 1625 08/15/21 2103 08/16/21 0825 08/16/21 1336  GLUCAP 187* 170* 123* 164* 198*      Recent Results (from the past 240 hour(s))  Blood Cultures x 2 sites     Status: Abnormal   Collection Time: 08/08/21  1:07 PM   Specimen: BLOOD  Result Value Ref Range Status   Specimen Description   Final    BLOOD RIGHT ANTECUBITAL Performed at [facility], [address], [place], [place]    Special Requests   Final    BOTTLES DRAWN AEROBIC AND ANAEROBIC Blood Culture results may not be optimal due to an excessive volume of blood received in culture bottles Performed at [facility], [address] [place], [place]    Culture  Setup Time   Final    GRAM POSITIVE COCCI IN BOTH AEROBIC AND ANAEROBIC BOTTLES CRITICAL RESULT CALLED TO, READ BACK BY AND VERIFIED WITH: [id number] PHARMD Janine Limbo 08/09/21 A BROWNING Performed at [facility], [address], [place],  [place]  Culture STREPTOCOCCUS INTERMEDIUS (A)  Final   Report Status 08/13/2021 FINAL  Final   Organism ID, Bacteria STREPTOCOCCUS INTERMEDIUS  Final      Susceptibility   Streptococcus intermedius - MIC*    PENICILLIN <=0.06 SENSITIVE Sensitive     CEFTRIAXONE <=0.12 SENSITIVE Sensitive     ERYTHROMYCIN >=8 RESISTANT Resistant     LEVOFLOXACIN <=0.25 SENSITIVE Sensitive     VANCOMYCIN 0.25 SENSITIVE Sensitive     * STREPTOCOCCUS INTERMEDIUS  Blood Cultures x 2 sites     Status: Abnormal   Collection Time: 08/08/21  1:07 PM   Specimen: BLOOD  Result Value Ref Range Status   Specimen Description   Final    BLOOD BLOOD RIGHT FOREARM Performed at [facility], [address], [place], [place] [address]    Special Requests   Final    BOTTLES DRAWN AEROBIC AND ANAEROBIC Blood Culture results may not be optimal due to an excessive volume of blood received in culture bottles Performed at [facility], [address], [place], [place] [address]    Culture  Setup Time   Final    GRAM POSITIVE COCCI ANAEROBIC BOTTLE ONLY CRITICAL VALUE NOTED.  VALUE IS CONSISTENT WITH PREVIOUSLY REPORTED AND CALLED VALUE.    Culture (A)  Final    STREPTOCOCCUS INTERMEDIUS SUSCEPTIBILITIES PERFORMED ON PREVIOUS CULTURE WITHIN THE LAST 5 DAYS. Performed at [facility], [address] [place], [place] [address]    Report Status 08/12/2021 FINAL  Final  Blood Culture ID Panel (Reflexed)     Status: Abnormal   Collection Time: 08/08/21  1:07 PM  Result Value Ref Range Status   Enterococcus faecalis NOT DETECTED NOT DETECTED Final   Enterococcus Faecium NOT DETECTED NOT DETECTED Final   Listeria monocytogenes NOT DETECTED NOT DETECTED Final   Staphylococcus species NOT DETECTED NOT DETECTED Final   Staphylococcus aureus (BCID) NOT DETECTED NOT DETECTED Final   Staphylococcus epidermidis NOT DETECTED NOT DETECTED Final   Staphylococcus lugdunensis NOT  DETECTED NOT DETECTED Final   Streptococcus species DETECTED (A) NOT DETECTED Final    Comment: Not Enterococcus species, Streptococcus agalactiae, Streptococcus pyogenes, or Streptococcus pneumoniae. CRITICAL RESULT CALLED TO, READ BACK BY AND VERIFIED WITH: Janine Limbo PHARMD [id number] 08/09/21 A BROWNING    Streptococcus agalactiae NOT DETECTED NOT DETECTED Final   Streptococcus pneumoniae NOT DETECTED NOT DETECTED Final   Streptococcus pyogenes NOT DETECTED NOT DETECTED Final   A.calcoaceticus-baumannii NOT DETECTED NOT DETECTED Final   Bacteroides fragilis NOT DETECTED NOT DETECTED Final   Enterobacterales NOT DETECTED NOT DETECTED Final   Enterobacter cloacae complex NOT DETECTED NOT DETECTED Final   Escherichia coli NOT DETECTED NOT DETECTED Final   Klebsiella aerogenes NOT DETECTED NOT DETECTED Final   Klebsiella oxytoca NOT DETECTED NOT DETECTED Final   Klebsiella pneumoniae NOT DETECTED NOT DETECTED Final   Proteus species NOT DETECTED NOT DETECTED Final   Salmonella species NOT DETECTED NOT DETECTED Final   Serratia marcescens NOT DETECTED NOT DETECTED Final   Haemophilus influenzae NOT DETECTED NOT DETECTED Final   Neisseria meningitidis NOT DETECTED NOT DETECTED Final   Pseudomonas aeruginosa NOT DETECTED NOT DETECTED Final   Stenotrophomonas maltophilia NOT DETECTED NOT DETECTED Final   Candida albicans NOT DETECTED NOT DETECTED Final   Candida auris NOT DETECTED NOT DETECTED Final   Candida glabrata NOT DETECTED NOT DETECTED Final   Candida krusei NOT DETECTED NOT DETECTED Final   Candida parapsilosis NOT DETECTED NOT DETECTED Final  Candida tropicalis NOT DETECTED NOT DETECTED Final   Cryptococcus neoformans/gattii NOT DETECTED NOT DETECTED Final    Comment: Performed at [facility], [address], [place], [place] [address]  Resp Panel by RT-PCR (Flu A&B, Covid) Nasopharyngeal Swab     Status: None   Collection Time: 08/08/21  3:00 PM   Specimen: Nasopharyngeal  Swab; Nasopharyngeal(NP) swabs in vial transport medium  Result Value Ref Range Status   SARS Coronavirus 2 by RT PCR NEGATIVE NEGATIVE Final    Comment: (NOTE) SARS-CoV-2 target nucleic acids are NOT DETECTED.  The SARS-CoV-2 RNA is generally detectable in upper respiratory specimens during the acute phase of infection. The lowest concentration of SARS-CoV-2 viral copies this assay can detect is 138 copies/mL. A negative result does not preclude SARS-Cov-2 infection and should not be used as the sole basis for treatment or other patient management decisions. A negative result may occur with  improper specimen collection/handling, submission of specimen other than nasopharyngeal swab, presence of viral mutation(s) within the areas targeted by this assay, and inadequate number of viral copies(<138 copies/mL). A negative result must be combined with clinical observations, patient history, and epidemiological information. The expected result is Negative.  Fact Sheet for Patients:  BloggerCourse.com  Fact Sheet for Healthcare Providers:  SeriousBroker.it  This test is no t yet approved or cleared by the [place] FDA and  has been authorized for detection and/or diagnosis of SARS-CoV-2 by FDA under an Emergency Use Authorization (EUA). This EUA will remain  in effect (meaning this test can be used) for the duration of the COVID-19 declaration under Section 564(b)(1) of the Act, 21 U.S.C.section 360bbb-3(b)(1), unless the authorization is terminated  or revoked sooner.       Influenza A by PCR NEGATIVE NEGATIVE Final   Influenza B by PCR NEGATIVE NEGATIVE Final    Comment: (NOTE) The Xpert Xpress SARS-CoV-2/FLU/RSV plus assay is intended as an aid in the diagnosis of influenza from Nasopharyngeal swab specimens and should not be used as a sole basis for treatment. Nasal washings and aspirates are unacceptable for Xpert Xpress  SARS-CoV-2/FLU/RSV testing.  Fact Sheet for Patients: BloggerCourse.com  Fact Sheet for Healthcare Providers: SeriousBroker.it  This test is not yet approved or cleared by the [place] FDA and has been authorized for detection and/or diagnosis of SARS-CoV-2 by FDA under an Emergency Use Authorization (EUA). This EUA will remain in effect (meaning this test can be used) for the duration of the COVID-19 declaration under Section 564(b)(1) of the Act, 21 U.S.C. section 360bbb-3(b)(1), unless the authorization is terminated or revoked.  Performed at [facility], [address], [place], [place] [address]   Aerobic/Anaerobic Culture w Gram Stain (surgical/deep wound)     Status: None   Collection Time: 08/09/21  6:18 PM   Specimen: Abscess  Result Value Ref Range Status   Specimen Description   Final    ABSCESS RIGHT BIG TOE Performed at [facility], [address], [place], [place] [address]    Special Requests   Final    NONE Performed at [facility], [address] [place], [place] [address]    Gram Stain   Final    RARE WBC PRESENT, PREDOMINANTLY MONONUCLEAR ABUNDANT GRAM POSITIVE COCCI IN PAIRS ABUNDANT GRAM POSITIVE COCCI IN CLUSTERS    Culture   Final    FEW STREPTOCOCCUS AGALACTIAE TESTING AGAINST S. AGALACTIAE NOT ROUTINELY PERFORMED DUE TO PREDICTABILITY OF AMP/PEN/VAN SUSCEPTIBILITY. MODERATE STREPTOCOCCUS  INTERMEDIUS FEW PREVOTELLA BIVIA BETA LACTAMASE POSITIVE Performed at [facility], [address] [place], [place] [address]    Report Status 08/15/2021 FINAL  Final   Organism ID, Bacteria STREPTOCOCCUS INTERMEDIUS  Final      Susceptibility   Streptococcus intermedius - MIC*    PENICILLIN <=0.06 SENSITIVE Sensitive     CEFTRIAXONE <=0.12 SENSITIVE Sensitive     ERYTHROMYCIN >=8 RESISTANT Resistant     LEVOFLOXACIN <=0.25 SENSITIVE  Sensitive     VANCOMYCIN 0.25 SENSITIVE Sensitive     * MODERATE STREPTOCOCCUS INTERMEDIUS  Culture, blood (Routine X 2) w Reflex to ID Panel     Status: None   Collection Time: 08/11/21 10:37 AM   Specimen: BLOOD  Result Value Ref Range Status   Specimen Description   Final    BLOOD BLOOD RIGHT FOREARM Performed at [facility], [address] [place], [place] [address]    Special Requests   Final    BOTTLES DRAWN AEROBIC AND ANAEROBIC Blood Culture adequate volume Performed at [facility], [address], [place], [place] [address]    Culture   Final    NO GROWTH 5 DAYS Performed at [facility], [address], [place], [place] [address]    Report Status 08/16/2021 FINAL  Final  Culture, blood (Routine X 2) w Reflex to ID Panel     Status: None   Collection Time: 08/11/21 10:41 AM   Specimen: BLOOD  Result Value Ref Range Status   Specimen Description   Final    BLOOD BLOOD LEFT FOREARM Performed at [facility], [address] [place], [place] [address]    Special Requests   Final    BOTTLES DRAWN AEROBIC AND ANAEROBIC Blood Culture results may not be optimal due to an excessive volume of blood received in culture bottles Performed at [facility], [address] [place], [place] [address]    Culture   Final    NO GROWTH 5 DAYS Performed at [facility], [address] [place], [place] [address]    Report Status 08/16/2021 FINAL  Final          Radiology Studies: CT ABDOMEN PELVIS W CONTRAST  Result Date: 08/15/2021 CLINICAL DATA:  Sepsis Intrabdominal abscess sepsis, concerned for intraabd abscess. Elevated white blood cell count. EXAM: CT ABDOMEN AND PELVIS WITH CONTRAST TECHNIQUE: Multidetector CT imaging of the abdomen and pelvis was performed using the standard protocol following bolus administration of intravenous contrast. CONTRAST:  80mL OMNIPAQUE IOHEXOL 350 MG/ML  SOLN COMPARISON:  None. FINDINGS: Lower chest: No acute abnormality. Coronary artery calcifications four-vessel. Hepatobiliary: No focal liver abnormality. Multiple subcentimeter calcified gallstones noted within the gallbladder lumen. No gallbladder wall thickening or pericholecystic fluid. No biliary dilatation. Pancreas: No focal lesion. Normal pancreatic contour. No surrounding inflammatory changes. No main pancreatic ductal dilatation. Spleen: Normal in size without focal abnormality. Adrenals/Urinary Tract: No nodularity of the right adrenal gland. There is a 1.9 cm left adrenal gland nodule with a density of 28 Hounsfield units. Nonspecific bilateral perinephric stranding. Bilateral kidneys enhance symmetrically. Subcentimeter hypodensities are too small to characterize. No hydronephrosis. No hydroureter. Irregular circumferential urinary bladder wall thickening. Stomach/Bowel: PO contrast reaches the sigmoid colon. Stomach is under distended and within normal limits. No evidence of bowel wall thickening or dilatation. Appendix appears normal. Vascular/Lymphatic: No abdominal aorta or iliac aneurysm. Mild-to-moderate atherosclerotic plaque of the aorta and its branches. No abdominal, pelvic,  or inguinal lymphadenopathy. Reproductive: Status post hysterectomy. No adnexal masses. Other: No intraperitoneal free fluid. No intraperitoneal free gas. No organized fluid collection. Musculoskeletal: No abdominal wall hernia or abnormality. Subcutaneus soft tissue edema and emphysema along the bilateral lower anterior abdominal wall likely related to medication injection. Densely sclerotic lesion within the L2 and L5 vertebral bodies likely represent bone islands. No suspicious lytic or blastic osseous lesions. No acute displaced fracture. Multilevel degenerative changes of the spine. IMPRESSION: 1. Irregular circumferential urinary bladder wall thickening. Finding may represent underlying infection or malignancy.  Correlate with urinalysis. Consider direct visualization. 2. Subcutaneus soft tissue edema and emphysema along the bilateral lower anterior abdominal wall likely related to medication injection. Correlate with clinical history/physical exam. 3. Cholelithiasis with no CT findings of acute cholecystitis or choledocholithiasis. 4. Indeterminate 1.9 cm left adrenal gland nodule. Recommend CT adrenal gland protocol in 1 year for further evaluation and evaluation of stability. 5. Densely sclerotic lesion within the L2 and L5 vertebral bodies likely represent bone islands. Recommend attention on follow-up. 6.  Aortic Atherosclerosis (ICD10-I70.0). Electronically Signed   By: Tish Frederickson M.D.   On: 08/15/2021 20:56   US RENAL  Result Date: 08/14/2021 CLINICAL DATA:  AK I EXAM: RENAL / URINARY TRACT ULTRASOUND COMPLETE COMPARISON:  August 08, 2021 FINDINGS: Right Kidney: Renal measurements: 11.0 x 5.8 x 5.3 cm = volume: 178 mL. Echogenicity within normal limits. No hydronephrosis. Kidney is lobular in appearance. There is a possible exophytic hypoechoic mass which measures 10 x 9 x 11 mm. There is an additional hypoechoic mass with suggestion of posterior acoustic enhancement in the lower pole which measures 15 x 24 x 18 mm. Left Kidney: Renal measurements: 10.9 x 5.5 x 4.8 cm = volume: 151 mL. Echogenicity within normal limits. No mass or hydronephrosis visualized. Kidney is lobular in appearance. Bladder: Appears normal for degree of bladder distention. Other: Small amount of free fluid adjacent to the RIGHT kidney. IMPRESSION: 1. No hydronephrosis. 2. Indeterminate RIGHT renal masses, likely complicated cysts. Recommend follow-up evaluation with contrast-enhanced renal cyst protocol CT or MRI when clinically appropriate. 3. Small volume free fluid adjacent to the RIGHT kidney, nonspecific. Electronically Signed   By: Meda Klinefelter M.D.   On: 08/14/2021 17:01        Scheduled Meds:  amLODipine  10 mg  Oral QHS   aspirin  325 mg Oral Daily   buPROPion  300 mg Oral Daily   calcium carbonate  400 mg of elemental calcium Oral BID   Chlorhexidine Gluconate Cloth  6 each Topical Daily   enoxaparin (LOVENOX) injection  60 mg Subcutaneous Q24H   furosemide  20 mg Oral Daily   insulin aspart  0-15 Units Subcutaneous TID WC   insulin aspart  0-5 Units Subcutaneous QHS   insulin aspart  8 Units Subcutaneous TID WC   insulin glargine-yfgn  15 Units Subcutaneous BID   metoprolol tartrate  100 mg Oral BID   metroNIDAZOLE  500 mg Oral Q12H   omega-3 acid ethyl esters  1 g Oral Daily   pantoprazole  40 mg Oral Daily   senna-docusate  2 tablet Oral BID   sodium chloride flush  10-40 mL Intracatheter Q12H   Vitamin D (Ergocalciferol)  50,000 Units Oral Q7 days   Continuous Infusions:  penicillin g continuous IV infusion 9 Million Units (08/16/21 1108)     LOS: 8 days        Kathlen Mody, MD [facility]   To contact the  attending provider between 7A-7P or the covering provider during after hours 7P-7A, please log into the web site [email or web address] and access using universal Dane password for that web site. If you do not have the password, please call the hospital operator.  08/16/2021, 2:48 PM

## 2021-08-16 NOTE — Progress Notes (Signed)
Subjective: POD # 3 s/p return to the operating room for incision and drainage, debridement, washout of right foot infection.  Denies pain in his feet denies any fevers or chills.  Had CT done of his abdomen last night  Objective: AAO x3, NAD-wife is at bedside DP/PT pulses palpable bilaterally, CRT less than 3 seconds Changed the dressing today.  T small amount of bloody drainage in the dorsal incision, no purulence.  Strip the JP drain which has been 25 cc of blood.  Mild erythema periincisional, no cellulitis.  Nothing fluctuant to suggest residual abscess.       A/P POD 3 from repeat debridement and washout  -Continue daily stripping of JP drain -Daily dressing changes with dry sterile gauze dressing we will continue to monitor -Overall his foot appears quite benign at this point and I do not think this would explain his 29,000 white count -will discuss with Dr. Ardelle Anton -If all other sources of infection are ruled out should be able to discharge with PICC line for bacteremia -Please call with questions or concerns  Sharl Ma, DPM 08/16/2021

## 2021-08-16 NOTE — Progress Notes (Signed)
[facility] for Infectious Disease  Date of Admission:  08/08/2021       Lines: Rue picc  Abx: 9/23-c penicillin 9/23-c metronidazole  9/21-23 ceftriaxone 9/20-21 cefazolin  A/p Strep intermedius bacteremia Bilateral dm feet ulcer; right phalanx/2nd toe osteo  S/p amputation 1st & 2nd toe; wound cx strep intermedius, prevotella, gbs Tte negative; strep intermedius bacteremia cleared  Patient has planned 4 weeks iv pcn and po flagyl per Dr Drucilla Schmidt  -------- 9/27 assessment High wbc 29 stable No clinical sepsis Abd pelv ct with contrast no abscess Right surgical foot appears without worsening cellulitic change or developing underlying abscess  Cx prior to abx no gram negative.  Will keep with penicillin/metronidazole abx. Can follow up in clinic   -continue penicillin/flagyl -id follow up and opat arranged -ok to discharge from id standpoint when ready with primary team  I spent more than 35 minute reviewing data/chart, and coordinating care and >50% direct face to face time providing counseling/discussing diagnostics/treatment plan with patient   OPAT Orders Discharge antibiotics:   Penicillin via continuous infusion   Oral metronidazole 500 twice daily   Duration:   4 weeks   End Date:   09/08/2021   [facility] Care Per Protocol:     Labs  weekly while on IV antibiotics: _x_ CBC with differential _x_ BMP w GFR/CMP _x_ CRP __x ESR     x__ Please pull PIC at completion of IV antibiotics __ Please leave PIC in place until doctor has seen patient or been notified   Fax weekly labs to [phone]   Clinic Follow Up Appt:     Marygrace Drought has an appointment on 09/02/2021 at 845 AM with Dr. Tommy Medal   The [facility] for Infectious Disease is located in the [facility] at   [place] in [place].   [address], which is located to the left of the elevators.   Phone: [phone]   Fax: [phone]   [email or web address]   He arrive 15 to 30 minutes prior to the appointment.    Active Problems:   Toe infection   Cellulitis of right lower extremity   Osteomyelitis of great toe of right foot (HCC)   Osteomyelitis of second toe of right foot (HCC)   Diabetes mellitus due to underlying condition with diabetic autonomic neuropathy, without long-term current use of insulin (HCC)   Class 3 severe obesity with serious comorbidity and body mass index (BMI) of 45.0 to 49.9 in adult [facility])   Elevated LFTs   Post-operative state   Cellulitis of left lower extremity   Streptococcal bacteremia   No Known Allergies  Scheduled Meds:  amLODipine  10 mg Oral QHS   aspirin  325 mg Oral Daily   buPROPion  300 mg Oral Daily   calcium carbonate  400 mg of elemental calcium Oral BID   Chlorhexidine Gluconate Cloth  6 each Topical Daily   enoxaparin (LOVENOX) injection  60 mg Subcutaneous Q24H   furosemide  20 mg Oral Daily   insulin aspart  0-15 Units Subcutaneous TID WC   insulin aspart  0-5 Units Subcutaneous QHS   insulin aspart  8 Units Subcutaneous TID WC   insulin glargine-yfgn  15 Units Subcutaneous BID   metoprolol tartrate  100 mg Oral BID   metroNIDAZOLE  500 mg Oral Q12H   omega-3 acid ethyl esters  1 g Oral Daily   pantoprazole  40 mg Oral Daily   senna-docusate  2 tablet Oral BID   sodium chloride flush  10-40 mL Intracatheter Q12H   Vitamin D (Ergocalciferol)  50,000 Units Oral Q7 days   Continuous Infusions:  penicillin g continuous IV infusion 9 Million Units (08/16/21 1108)   PRN Meds:.ALPRAZolam, oxyCODONE, polyethylene glycol, sodium chloride flush, traMADol   SUBJECTIVE: Continues to do well No n/v/diarrhea No rash  Right foot doing well  Review of Systems: ROS All other ROS was negative, except mentioned above     OBJECTIVE: Vitals:   08/15/21 1331 08/15/21 2106 08/16/21 0557 08/16/21 1338  BP: 132/74 138/71 124/69 119/67   Pulse: 62 77 69 (!) 54  Resp: _0 Temp: (!) 97.4 F (36.3 C) (!) 97.4 F (36.3 C) 98 F (36.7 C)   TempSrc: Oral Oral Oral   SpO2: 100% 97% 96% 99%  Weight:      Height:       Body mass index is 35.46 kg/m.  Physical Exam General/constitutional: no distress, pleasant HEENT: Normocephalic, PER, Conj Clear, EOMI, Oropharynx clear Neck supple CV: rrr no mrg Lungs: clear to auscultation, normal respiratory effort Abd: Soft, Nontender Ext: no edema Skin: No Rash Neuro: nonfocal MSK: reviewed photos today from right foot; no swelling/erythema/purulence; incision intact       Lab Results Lab Results  Component Value Date   WBC 29.2 (H) 08/16/2021   HGB 10.8 (L) 08/16/2021   HCT 31.9 (L) 08/16/2021   MCV 85.1 08/16/2021   PLT 445 (H) 08/16/2021    Lab Results  Component Value Date   CREATININE 0.94 08/16/2021   BUN 35 (H) 08/16/2021   NA 124 (L) 08/16/2021   K 4.9 08/16/2021   CL 91 (L) 08/16/2021   CO2 24 08/16/2021    Lab Results  Component Value Date   ALT 45 (H) 08/14/2021   AST 34 08/14/2021   ALKPHOS 178 (H) 08/14/2021   BILITOT 1.2 08/14/2021      Microbiology: Recent Results (from the past 240 hour(s))  Blood Cultures x 2 sites     Status: Abnormal   Collection Time: 08/08/21  1:07 PM   Specimen: BLOOD  Result Value Ref Range Status   Specimen Description   Final    BLOOD RIGHT ANTECUBITAL Performed at [facility], [place] [address] [place], [place] [address]    Special Requests   Final    BOTTLES DRAWN AEROBIC AND ANAEROBIC Blood Culture results may not be optimal due to an excessive volume of blood received in culture bottles Performed at [facility] [address] [place], [place] [address]    Culture  Setup Time   Final    GRAM POSITIVE COCCI IN BOTH AEROBIC AND ANAEROBIC BOTTLES CRITICAL RESULT CALLED TO, READ BACK BY AND VERIFIED WITH: Shelda Jakes [id number] 08/09/21 A BROWNING Performed  at [facility], [place] [address] [place], [place] [address]    Culture STREPTOCOCCUS INTERMEDIUS (A)  Final   Report Status 08/13/2021 FINAL  Final   Organism ID, Bacteria STREPTOCOCCUS INTERMEDIUS  Final      Susceptibility   Streptococcus intermedius - MIC*    PENICILLIN <=0.06 SENSITIVE Sensitive     CEFTRIAXONE <=0.12 SENSITIVE Sensitive     ERYTHROMYCIN >=8 RESISTANT Resistant     LEVOFLOXACIN <=0.25 SENSITIVE Sensitive     VANCOMYCIN 0.25 SENSITIVE Sensitive     * STREPTOCOCCUS INTERMEDIUS  Blood Cultures x 2 sites  Status: Abnormal   Collection Time: 08/08/21  1:07 PM   Specimen: BLOOD  Result Value Ref Range Status   Specimen Description   Final    BLOOD BLOOD RIGHT FOREARM Performed at [facility] [address] [place], [place] [address]    Special Requests   Final    BOTTLES DRAWN AEROBIC AND ANAEROBIC Blood Culture results may not be optimal due to an excessive volume of blood received in culture bottles Performed at [facility] [address] [place], [place] [address]    Culture  Setup Time   Final    GRAM POSITIVE COCCI ANAEROBIC BOTTLE ONLY CRITICAL VALUE NOTED.  VALUE IS CONSISTENT WITH PREVIOUSLY REPORTED AND CALLED VALUE.    Culture (A)  Final    STREPTOCOCCUS INTERMEDIUS SUSCEPTIBILITIES PERFORMED ON PREVIOUS CULTURE WITHIN THE LAST 5 DAYS. Performed at [facility], [place] [address] [place], [place] [address]    Report Status 08/12/2021 FINAL  Final  Blood Culture ID Panel (Reflexed)     Status: Abnormal   Collection Time: 08/08/21  1:07 PM  Result Value Ref Range Status   Enterococcus faecalis NOT DETECTED NOT DETECTED Final   Enterococcus Faecium NOT DETECTED NOT DETECTED Final   Listeria monocytogenes NOT DETECTED NOT DETECTED Final   Staphylococcus species NOT DETECTED NOT DETECTED Final   Staphylococcus aureus (BCID) NOT DETECTED NOT DETECTED Final   Staphylococcus epidermidis NOT DETECTED  NOT DETECTED Final   Staphylococcus lugdunensis NOT DETECTED NOT DETECTED Final   Streptococcus species DETECTED (A) NOT DETECTED Final    Comment: Not Enterococcus species, Streptococcus agalactiae, Streptococcus pyogenes, or Streptococcus pneumoniae. CRITICAL RESULT CALLED TO, READ BACK BY AND VERIFIED WITH: Shelda Jakes [id number] 08/09/21 A BROWNING    Streptococcus agalactiae NOT DETECTED NOT DETECTED Final   Streptococcus pneumoniae NOT DETECTED NOT DETECTED Final   Streptococcus pyogenes NOT DETECTED NOT DETECTED Final   A.calcoaceticus-baumannii NOT DETECTED NOT DETECTED Final   Bacteroides fragilis NOT DETECTED NOT DETECTED Final   Enterobacterales NOT DETECTED NOT DETECTED Final   Enterobacter cloacae complex NOT DETECTED NOT DETECTED Final   Escherichia coli NOT DETECTED NOT DETECTED Final   Klebsiella aerogenes NOT DETECTED NOT DETECTED Final   Klebsiella oxytoca NOT DETECTED NOT DETECTED Final   Klebsiella pneumoniae NOT DETECTED NOT DETECTED Final   Proteus species NOT DETECTED NOT DETECTED Final   Salmonella species NOT DETECTED NOT DETECTED Final   Serratia marcescens NOT DETECTED NOT DETECTED Final   Haemophilus influenzae NOT DETECTED NOT DETECTED Final   Neisseria meningitidis NOT DETECTED NOT DETECTED Final   Pseudomonas aeruginosa NOT DETECTED NOT DETECTED Final   Stenotrophomonas maltophilia NOT DETECTED NOT DETECTED Final   Candida albicans NOT DETECTED NOT DETECTED Final   Candida auris NOT DETECTED NOT DETECTED Final   Candida glabrata NOT DETECTED NOT DETECTED Final   Candida krusei NOT DETECTED NOT DETECTED Final   Candida parapsilosis NOT DETECTED NOT DETECTED Final   Candida tropicalis NOT DETECTED NOT DETECTED Final   Cryptococcus neoformans/gattii NOT DETECTED NOT DETECTED Final    Comment: Performed at [facility], [address], [place],  [address]  Resp Panel by RT-PCR (Flu A&B, Covid) Nasopharyngeal Swab     Status: None   Collection  Time: 08/08/21  3:00 PM   Specimen: Nasopharyngeal Swab; Nasopharyngeal(NP) swabs in vial transport medium  Result Value Ref Range Status   SARS Coronavirus 2 by RT PCR NEGATIVE NEGATIVE Final    Comment: (  NOTE) SARS-CoV-2 target nucleic acids are NOT DETECTED.  The SARS-CoV-2 RNA is generally detectable in upper respiratory specimens during the acute phase of infection. The lowest concentration of SARS-CoV-2 viral copies this assay can detect is 138 copies/mL. A negative result does not preclude SARS-Cov-2 infection and should not be used as the sole basis for treatment or other patient management decisions. A negative result may occur with  improper specimen collection/handling, submission of specimen other than nasopharyngeal swab, presence of viral mutation(s) within the areas targeted by this assay, and inadequate number of viral copies(<138 copies/mL). A negative result must be combined with clinical observations, patient history, and epidemiological information. The expected result is Negative.  Fact Sheet for Patients:  EntrepreneurPulse.com.au  Fact Sheet for Healthcare Providers:  IncredibleEmployment.be  This test is no t yet approved or cleared by the [place] FDA and  has been authorized for detection and/or diagnosis of SARS-CoV-2 by FDA under an Emergency Use Authorization (EUA). This EUA will remain  in effect (meaning this test can be used) for the duration of the COVID-19 declaration under Section 564(b)(1) of the Act, 21 U.S.C.section 360bbb-3(b)(1), unless the authorization is terminated  or revoked sooner.       Influenza A by PCR NEGATIVE NEGATIVE Final   Influenza B by PCR NEGATIVE NEGATIVE Final    Comment: (NOTE) The Xpert Xpress SARS-CoV-2/FLU/RSV plus assay is intended as an aid in the diagnosis of influenza from Nasopharyngeal swab specimens and should not be used as a sole basis for treatment. Nasal washings  and aspirates are unacceptable for Xpert Xpress SARS-CoV-2/FLU/RSV testing.  Fact Sheet for Patients: EntrepreneurPulse.com.au  Fact Sheet for Healthcare Providers: IncredibleEmployment.be  This test is not yet approved or cleared by the [place] FDA and has been authorized for detection and/or diagnosis of SARS-CoV-2 by FDA under an Emergency Use Authorization (EUA). This EUA will remain in effect (meaning this test can be used) for the duration of the COVID-19 declaration under Section 564(b)(1) of the Act, 21 U.S.C. section 360bbb-3(b)(1), unless the authorization is terminated or revoked.  Performed at [facility], [place] [address] [place], [place] [address]   Aerobic/Anaerobic Culture w Gram Stain (surgical/deep wound)     Status: None   Collection Time: 08/09/21  6:18 PM   Specimen: Abscess  Result Value Ref Range Status   Specimen Description   Final    ABSCESS RIGHT BIG TOE Performed at [facility] [address] [place], [place] [address]    Special Requests   Final    NONE Performed at [facility], [place] [address], [place], [place] [address]    Gram Stain   Final    RARE WBC PRESENT, PREDOMINANTLY MONONUCLEAR ABUNDANT GRAM POSITIVE COCCI IN PAIRS ABUNDANT GRAM POSITIVE COCCI IN CLUSTERS    Culture   Final    FEW STREPTOCOCCUS AGALACTIAE TESTING AGAINST S. AGALACTIAE NOT ROUTINELY PERFORMED DUE TO PREDICTABILITY OF AMP/PEN/VAN SUSCEPTIBILITY. MODERATE STREPTOCOCCUS INTERMEDIUS FEW PREVOTELLA BIVIA BETA LACTAMASE POSITIVE Performed at [facility], [place] [address], [place],  [address]    Report Status 08/15/2021 FINAL  Final   Organism ID, Bacteria STREPTOCOCCUS INTERMEDIUS  Final      Susceptibility   Streptococcus intermedius - MIC*    PENICILLIN <=0.06 SENSITIVE Sensitive     CEFTRIAXONE <=0.12 SENSITIVE Sensitive     ERYTHROMYCIN >=8 RESISTANT  Resistant     LEVOFLOXACIN <=0.25 SENSITIVE Sensitive     VANCOMYCIN 0.25 SENSITIVE Sensitive     *  MODERATE STREPTOCOCCUS INTERMEDIUS  Culture, blood (Routine X 2) w Reflex to ID Panel     Status: None   Collection Time: 08/11/21 10:37 AM   Specimen: BLOOD  Result Value Ref Range Status   Specimen Description   Final    BLOOD BLOOD RIGHT FOREARM Performed at [facility] [address] [place], [place] [address]    Special Requests   Final    BOTTLES DRAWN AEROBIC AND ANAEROBIC Blood Culture adequate volume Performed at [facility] [address], [place], [place] [address]    Culture   Final    NO GROWTH 5 DAYS Performed at [facility], [place] [address] [place], [place] [address]    Report Status 08/16/2021 FINAL  Final  Culture, blood (Routine X 2) w Reflex to ID Panel     Status: None   Collection Time: 08/11/21 10:41 AM   Specimen: BLOOD  Result Value Ref Range Status   Specimen Description   Final    BLOOD BLOOD LEFT FOREARM Performed at [facility] [address] [place], [place] [address]    Special Requests   Final    BOTTLES DRAWN AEROBIC AND ANAEROBIC Blood Culture results may not be optimal due to an excessive volume of blood received in culture bottles Performed at [facility] [facility] [address], [place], [place] [address]    Culture   Final    NO GROWTH 5 DAYS Performed at [facility], [place] [address], [place],  [address]    Report Status 08/16/2021 FINAL  Final     Serology:   Imaging: If present, new imagings (plain films, ct scans, and mri) have been personally visualized and interpreted; radiology reports have been reviewed. Decision making incorporated into the Impression / Recommendations.  9/26 abd pelv ct 1. Irregular circumferential urinary bladder wall thickening. Finding may represent underlying infection or malignancy. Correlate with  urinalysis. Consider direct visualization. 2. Subcutaneus soft tissue edema and emphysema along the bilateral lower anterior abdominal wall likely related to medication injection. Correlate with clinical history/physical exam. 3. Cholelithiasis with no CT findings of acute cholecystitis or choledocholithiasis. 4. Indeterminate 1.9 cm left adrenal gland nodule. Recommend CT adrenal gland protocol in 1 year for further evaluation and evaluation of stability. 5. Densely sclerotic lesion within the L2 and L5 vertebral bodies likely represent bone islands. Recommend attention on follow-up. 6.  Aortic Atherosclerosis  Jabier Mutton, [facility] for Infectious [place] [phone] pager    08/16/2021, 4:03 PM

## 2021-08-16 NOTE — Progress Notes (Signed)
Patient continues to decline nocturnal CPAP and has yet to have a family member to bring his machine from home. Order changed to prn at this time. He is aware that he may call for RT assistance if at any time he should change his mind or have his home equipment available.

## 2021-08-16 NOTE — Progress Notes (Signed)
Patient ID: [name] [name], male   DOB: 1956-08-03, 65 y.o.   MRN: [id number] [facility] Progress Note   Assessment/ Plan:   1.  Hyponatremia: True hyponatremia with hypo-osmolality and inappropriately elevated urine osmolality reflective of inappropriate ADH activation.  Continue current fluid restriction and begin furosemide 20 mg daily with continued lab monitoring based on clinical exam with edema. 2.  Chronic kidney disease: With intermittent acute kidney injury that appears to be hemodynamically mediated based on episodes of relative hypotension and timing of surgery.  Avoid NSAIDs and iodinated intravenous contrast. 3.  Left lower extremity cellulitis/abscess/osteomyelitis: Status post partial first ray amputation along with second toe amputation and drainage of abscess by podiatry.  Wound management per podiatry-images reviewed from earlier today. 4.  Anemia: Likely related to chronic nonhealing foot ulcers and recent surgery, continue to monitor with overt losses from wound drain.   Subjective:   Reports to be feeling better, denies any chest pain or shortness of breath   Objective:   BP 119/67 (BP Location: Left Arm)   Pulse (!) 54   Temp 98 F (36.7 C) (Oral)   Resp 16   Ht 6' (1.829 m)   Wt 118.6 kg   SpO2 99%   BMI 35.46 kg/m   Intake/Output Summary (Last 24 hours) at 08/16/2021 1423 Last data filed at 08/16/2021 1339 Gross per 24 hour  Intake 240 ml  Output 2300 ml  Net -2060 ml   Weight change:   Physical Exam: Gen: Comfortably resting in bed, watching television CVS: Pulse regular bradycardia, S1 and S2 normal Resp: Clear to auscultation bilaterally, no rales/rhonchi Abd: Soft, obese, nontender Ext: Trace lower extremity edema bilaterally.  Right ankle/foot dressing with drain in situ  Imaging: CT ABDOMEN PELVIS W CONTRAST  Result Date: 08/15/2021 CLINICAL DATA:  Sepsis Intrabdominal abscess sepsis, concerned for intraabd abscess. Elevated white  blood cell count. EXAM: CT ABDOMEN AND PELVIS WITH CONTRAST TECHNIQUE: Multidetector CT imaging of the abdomen and pelvis was performed using the standard protocol following bolus administration of intravenous contrast. CONTRAST:  24mL OMNIPAQUE IOHEXOL 350 MG/ML SOLN COMPARISON:  None. FINDINGS: Lower chest: No acute abnormality. Coronary artery calcifications four-vessel. Hepatobiliary: No focal liver abnormality. Multiple subcentimeter calcified gallstones noted within the gallbladder lumen. No gallbladder wall thickening or pericholecystic fluid. No biliary dilatation. Pancreas: No focal lesion. Normal pancreatic contour. No surrounding inflammatory changes. No main pancreatic ductal dilatation. Spleen: Normal in size without focal abnormality. Adrenals/Urinary Tract: No nodularity of the right adrenal gland. There is a 1.9 cm left adrenal gland nodule with a density of 28 Hounsfield units. Nonspecific bilateral perinephric stranding. Bilateral kidneys enhance symmetrically. Subcentimeter hypodensities are too small to characterize. No hydronephrosis. No hydroureter. Irregular circumferential urinary bladder wall thickening. Stomach/Bowel: PO contrast reaches the sigmoid colon. Stomach is under distended and within normal limits. No evidence of bowel wall thickening or dilatation. Appendix appears normal. Vascular/Lymphatic: No abdominal aorta or iliac aneurysm. Mild-to-moderate atherosclerotic plaque of the aorta and its branches. No abdominal, pelvic, or inguinal lymphadenopathy. Reproductive: Status post hysterectomy. No adnexal masses. Other: No intraperitoneal free fluid. No intraperitoneal free gas. No organized fluid collection. Musculoskeletal: No abdominal wall hernia or abnormality. Subcutaneus soft tissue edema and emphysema along the bilateral lower anterior abdominal wall likely related to medication injection. Densely sclerotic lesion within the L2 and L5 vertebral bodies likely represent bone  islands. No suspicious lytic or blastic osseous lesions. No acute displaced fracture. Multilevel degenerative changes of the spine. IMPRESSION: 1. Irregular  circumferential urinary bladder wall thickening. Finding may represent underlying infection or malignancy. Correlate with urinalysis. Consider direct visualization. 2. Subcutaneus soft tissue edema and emphysema along the bilateral lower anterior abdominal wall likely related to medication injection. Correlate with clinical history/physical exam. 3. Cholelithiasis with no CT findings of acute cholecystitis or choledocholithiasis. 4. Indeterminate 1.9 cm left adrenal gland nodule. Recommend CT adrenal gland protocol in 1 year for further evaluation and evaluation of stability. 5. Densely sclerotic lesion within the L2 and L5 vertebral bodies likely represent bone islands. Recommend attention on follow-up. 6.  Aortic Atherosclerosis (ICD10-I70.0). Electronically Signed   By: Tish Frederickson M.D.   On: 08/15/2021 20:56   US RENAL  Result Date: 08/14/2021 CLINICAL DATA:  AK I EXAM: RENAL / URINARY TRACT ULTRASOUND COMPLETE COMPARISON:  August 08, 2021 FINDINGS: Right Kidney: Renal measurements: 11.0 x 5.8 x 5.3 cm = volume: 178 mL. Echogenicity within normal limits. No hydronephrosis. Kidney is lobular in appearance. There is a possible exophytic hypoechoic mass which measures 10 x 9 x 11 mm. There is an additional hypoechoic mass with suggestion of posterior acoustic enhancement in the lower pole which measures 15 x 24 x 18 mm. Left Kidney: Renal measurements: 10.9 x 5.5 x 4.8 cm = volume: 151 mL. Echogenicity within normal limits. No mass or hydronephrosis visualized. Kidney is lobular in appearance. Bladder: Appears normal for degree of bladder distention. Other: Small amount of free fluid adjacent to the RIGHT kidney. IMPRESSION: 1. No hydronephrosis. 2. Indeterminate RIGHT renal masses, likely complicated cysts. Recommend follow-up evaluation with  contrast-enhanced renal cyst protocol CT or MRI when clinically appropriate. 3. Small volume free fluid adjacent to the RIGHT kidney, nonspecific. Electronically Signed   By: Meda Klinefelter M.D.   On: 08/14/2021 17:01    Labs: BMET Recent Labs  Lab 08/10/21 0503 08/11/21 0846 08/12/21 0459 08/14/21 1303 08/15/21 0321 08/16/21 0538 08/16/21 0548  NA 134* 126* 127* 123* 122* 124* 124*  K 3.7 4.5 5.0 5.3* 4.9 5.0 4.9  CL 97* 90* 94* 90* 88* 91* 91*  CO2 28 25 24 25 24 24 24   GLUCOSE 191* 347* 221* 347* 180* 158* 160*  BUN 30* 27* 32* 39* 39* 36* 35*  CREATININE 1.25* 1.17 1.26* 1.53* 1.24 0.99 0.94  CALCIUM 8.3* 8.9 9.5 9.6 9.0 9.8 9.6  PHOS  --   --   --   --   --   --  4.7*   CBC Recent Labs  Lab 08/11/21 0846 08/12/21 0948 08/14/21 1303 08/15/21 0321 08/16/21 0538  WBC 25.2* 29.1* 28.7* 29.8* 29.2*  NEUTROABS 20.7* 22.1* 22.7*  --  22.1*  HGB 12.1* 12.3* 11.1* 10.7* 10.8*  HCT 35.6* 35.5* 32.5* 31.5* 31.9*  MCV 84.8 84.1 85.3 85.8 85.1  PLT 245 287 336 344 445*    Medications:     amLODipine  10 mg Oral QHS   aspirin  325 mg Oral Daily   buPROPion  300 mg Oral Daily   calcium carbonate  400 mg of elemental calcium Oral BID   Chlorhexidine Gluconate Cloth  6 each Topical Daily   enoxaparin (LOVENOX) injection  60 mg Subcutaneous Q24H   insulin aspart  0-15 Units Subcutaneous TID WC   insulin aspart  0-5 Units Subcutaneous QHS   insulin aspart  8 Units Subcutaneous TID WC   insulin glargine-yfgn  15 Units Subcutaneous BID   metoprolol tartrate  100 mg Oral BID   metroNIDAZOLE  500 mg Oral Q12H   omega-3  acid ethyl esters  1 g Oral Daily   pantoprazole  40 mg Oral Daily   senna-docusate  2 tablet Oral BID   sodium chloride flush  10-40 mL Intracatheter Q12H   Vitamin D (Ergocalciferol)  50,000 Units Oral Q7 days   Zetta Bills, MD 08/16/2021, 2:23 PM

## 2021-08-17 ENCOUNTER — Inpatient Hospital Stay (HOSPITAL_COMMUNITY): Payer: 59

## 2021-08-17 DIAGNOSIS — N179 Acute kidney failure, unspecified: Secondary | ICD-10-CM

## 2021-08-17 DIAGNOSIS — L03116 Cellulitis of left lower limb: Secondary | ICD-10-CM | POA: Diagnosis not present

## 2021-08-17 DIAGNOSIS — R7881 Bacteremia: Secondary | ICD-10-CM | POA: Diagnosis not present

## 2021-08-17 DIAGNOSIS — M7989 Other specified soft tissue disorders: Secondary | ICD-10-CM

## 2021-08-17 DIAGNOSIS — L03115 Cellulitis of right lower limb: Secondary | ICD-10-CM | POA: Diagnosis not present

## 2021-08-17 DIAGNOSIS — M79605 Pain in left leg: Secondary | ICD-10-CM

## 2021-08-17 LAB — CBC WITH DIFFERENTIAL/PLATELET
Abs Immature Granulocytes: 2.23 10*3/uL — ABNORMAL HIGH (ref 0.00–0.07)
Basophils Absolute: 0.1 10*3/uL (ref 0.0–0.1)
Basophils Relative: 0 %
Eosinophils Absolute: 0.2 10*3/uL (ref 0.0–0.5)
Eosinophils Relative: 1 %
HCT: 30.7 % — ABNORMAL LOW (ref 39.0–52.0)
Hemoglobin: 10.5 g/dL — ABNORMAL LOW (ref 13.0–17.0)
Immature Granulocytes: 8 %
Lymphocytes Relative: 11 %
Lymphs Abs: 3 10*3/uL (ref 0.7–4.0)
MCH: 28.9 pg (ref 26.0–34.0)
MCHC: 34.2 g/dL (ref 30.0–36.0)
MCV: 84.6 fL (ref 80.0–100.0)
Monocytes Absolute: 2.1 10*3/uL — ABNORMAL HIGH (ref 0.1–1.0)
Monocytes Relative: 8 %
Neutro Abs: 18.9 10*3/uL — ABNORMAL HIGH (ref 1.7–7.7)
Neutrophils Relative %: 72 %
Platelets: 465 10*3/uL — ABNORMAL HIGH (ref 150–400)
RBC: 3.63 MIL/uL — ABNORMAL LOW (ref 4.22–5.81)
RDW: 13.6 % (ref 11.5–15.5)
WBC: 26.5 10*3/uL — ABNORMAL HIGH (ref 4.0–10.5)
nRBC: 0 % (ref 0.0–0.2)

## 2021-08-17 LAB — HEPATIC FUNCTION PANEL
ALT: 29 U/L (ref 0–44)
AST: 27 U/L (ref 15–41)
Albumin: 2.1 g/dL — ABNORMAL LOW (ref 3.5–5.0)
Alkaline Phosphatase: 158 U/L — ABNORMAL HIGH (ref 38–126)
Bilirubin, Direct: 0.2 mg/dL (ref 0.0–0.2)
Indirect Bilirubin: 0.6 mg/dL (ref 0.3–0.9)
Total Bilirubin: 0.8 mg/dL (ref 0.3–1.2)
Total Protein: 6.8 g/dL (ref 6.5–8.1)

## 2021-08-17 LAB — BASIC METABOLIC PANEL
Anion gap: 8 (ref 5–15)
BUN: 38 mg/dL — ABNORMAL HIGH (ref 8–23)
CO2: 23 mmol/L (ref 22–32)
Calcium: 9.4 mg/dL (ref 8.9–10.3)
Chloride: 89 mmol/L — ABNORMAL LOW (ref 98–111)
Creatinine, Ser: 1.4 mg/dL — ABNORMAL HIGH (ref 0.61–1.24)
GFR, Estimated: 56 mL/min — ABNORMAL LOW (ref >60)
Glucose, Bld: 127 mg/dL — ABNORMAL HIGH (ref 70–99)
Potassium: 4.9 mmol/L (ref 3.5–5.1)
Sodium: 120 mmol/L — ABNORMAL LOW (ref 135–145)

## 2021-08-17 LAB — GLUCOSE, CAPILLARY
Glucose-Capillary: 134 mg/dL — ABNORMAL HIGH (ref 70–99)
Glucose-Capillary: 216 mg/dL — ABNORMAL HIGH (ref 70–99)
Glucose-Capillary: 96 mg/dL (ref 70–99)
Glucose-Capillary: 185 mg/dL — ABNORMAL HIGH (ref 70–99)

## 2021-08-17 LAB — SODIUM: Sodium: 124 mmol/L — ABNORMAL LOW (ref 135–145)

## 2021-08-17 LAB — URINE CULTURE: Culture: NO GROWTH

## 2021-08-17 LAB — PROCALCITONIN: Procalcitonin: 0.14 ng/mL

## 2021-08-17 LAB — C-REACTIVE PROTEIN: CRP: 11.4 mg/dL — ABNORMAL HIGH (ref <1.0)

## 2021-08-17 MED ORDER — TOLVAPTAN 15 MG PO TABS
15.0000 mg | ORAL_TABLET | Freq: Once | ORAL | Status: AC
  Administered 2021-08-17: 15 mg via ORAL
  Filled 2021-08-17: qty 1

## 2021-08-17 NOTE — Progress Notes (Signed)
Occupational Therapy Treatment Patient Details Name: [name] [name] MRN: [id number] DOB: 1956/01/16 Today's Date: 08/17/2021   History of present illness Pt s/p R great toe amputation 08/09/21 and R foot I&D 08/13/21.  Pt with hx of CAD, CABG, DM and "bad L knee"  - pt states he can not rely on L knee to support him.   OT comments  Patient was educated on using AE for LB Dressing/ bathing tasks to reduce need for bending. Patients son was present and reported having ordered total hip kit on phone during session. Patient was mod A to use AE for LB dressing with reacher and sock aid for L foot. Patient's discharge plan remains appropriate at this time. OT will continue to follow acutely.     Recommendations for follow up therapy are one component of a multi-disciplinary discharge planning process, led by the attending physician.  Recommendations may be updated based on patient status, additional functional criteria and insurance authorization.    Follow Up Recommendations  Home health OT    Equipment Recommendations  Wheelchair (measurements OT);Other (comment)    Recommendations for Other Services      Precautions / Restrictions Precautions Precautions: Fall Required Braces or Orthoses: Other Brace Other Brace: Darko shoe for R foot in orders but not in room at this time. Restrictions Weight Bearing Restrictions: Yes RLE Weight Bearing: Non weight bearing       Mobility Bed Mobility Overal bed mobility: Needs Assistance Bed Mobility: Supine to Sit     Supine to sit: Min guard;HOB elevated     General bed mobility comments: pt using bed rail and requires increased time.    Transfers                      Balance                                           ADL either performed or assessed with clinical judgement   ADL                       Lower Body Dressing: Sitting/lateral leans Lower Body Dressing Details (indicate cue type and  reason): patient was mod A for lateral leaning to don theraband (simulating pants) on edge of bed with patient using reacher to get over bilateral feet to knees. patient was educated on weight shifting with increased assist to maintain sitting up in "3 in 1 commode dimensions". patient was educated on using sock aid to don sock on LLE with mod A to don sock with patient having difficulty lifting LLE off the floor.                     Vision Patient Visual Report: No change from baseline     Perception     Praxis      Cognition Arousal/Alertness: Awake/alert Behavior During Therapy: WFL for tasks assessed/performed Overall Cognitive Status: Within Functional Limits for tasks assessed                                          Exercises     Shoulder Instructions       General Comments      Pertinent Vitals/ Pain  Pain Assessment: Faces Faces Pain Scale: Hurts a little bit Pain Location: R foot Pain Descriptors / Indicators: Aching Pain Intervention(s): Monitored during session;Limited activity within patient's tolerance  Home Living                                          Prior Functioning/Environment              Frequency  Min 2X/week        Progress Toward Goals  OT Goals(current goals can now be found in the care plan section)  Progress towards OT goals: Progressing toward goals  Acute Rehab OT Goals Patient Stated Goal: HOME with spouse  Plan Discharge plan remains appropriate    Co-evaluation                 AM-PAC OT "6 Clicks" Daily Activity     Outcome Measure   Help from another person eating meals?: A Little Help from another person taking care of personal grooming?: A Little Help from another person toileting, which includes using toliet, bedpan, or urinal?: A Lot Help from another person bathing (including washing, rinsing, drying)?: A Lot Help from another person to put on and taking  off regular upper body clothing?: A Little Help from another person to put on and taking off regular lower body clothing?: A Lot 6 Click Score: 15    End of Session Equipment Utilized During Treatment: Other (comment) (total hip kit)  OT Visit Diagnosis: Muscle weakness (generalized) (M62.81);Pain Pain - Right/Left: Right Pain - part of body: Ankle and joints of foot   Activity Tolerance Patient tolerated treatment well   Patient Left in bed;with call bell/phone within reach;with family/visitor present;with bed alarm set   Nurse Communication          Time: 0206-0230 OT Time Calculation (min): 24 min  Charges: OT General Charges $OT Visit: 1 Visit OT Treatments $Self Care/Home Management : 23-37 mins  [name] OTR/L, [place] [facility] Office# [phone] Pager# [phone]   [name] [name] 08/17/2021, 2:43 PM

## 2021-08-17 NOTE — Progress Notes (Signed)
PROGRESS NOTE    [name] [name]  NWG:[id number] DOB: [birth year] DOA: 08/08/2021 PCP: Tally Joe, MD    Chief Complaint  Patient presents with   Wound Infection    Brief Narrative:  65 year old gentleman prior history of hypertension, type 2 diabetes, hyperlipidemia presents to the hospital with left lower extremity cellulitis as well as right great toe nonhealing wound with pain and tenderness.  Patient reports that over the last 1 week prior to admission patient had worsening erythema pain and foul-smelling discharge from the right toe.  He follows up with wound care.  Podiatry and ID were consulted and patient underwent partial first ray amputation of the right foot, partial second toe amputation and debridement of the deep abscess in the right foot by Dr. Loreta Ave on 08/09/2021 and repeat debridement. ID on board, recommended 4 weeks IV Penicillin and oral flagyl.  Pt would be stable for discharge when his sodium improves.    Assessment & Plan:   Active Problems:   Toe infection   Cellulitis of right lower extremity   Osteomyelitis of great toe of right foot (HCC)   Osteomyelitis of second toe of right foot (HCC)   Diabetes mellitus due to underlying condition with diabetic autonomic neuropathy, without long-term current use of insulin (HCC)   Class 3 severe obesity with serious comorbidity and body mass index (BMI) of 45.0 to 49.9 in adult [facility])   Elevated LFTs   Post-operative state   Cellulitis of left lower extremity   Streptococcal bacteremia  #1 right foot ulcer/wound concerning for osteomyelitis -MRI on admission of the right foot concerning for septic arthritis and osteomyelitis of the first proximal and distal phalanges as well as osteomyelitis of the second distal phalanx.  MRI also showing infectious tenosynovitis around the first metatarsal and multiple forefoot abscesses. -Patient seen in consultation by ID and podiatry. -Patient status post partial first ray  amputation, partial second toe amputation and debridement of deep abscess of the right foot per Dr. Ardelle Anton 08/09/2021. -Due to worsening leukocytosis patient underwent repeat debridement of the right foot on 08/13/2021. -MRI left foot negative for osteomyelitis. -Drawn on from right foot showed Streptococcus intermedius sensitive to penicillin. -ID following and patient initially started on IV Rocephin, subsequently transition to IV penicillin and Flagyl to complete a 4-week course of antibiotic treatment per ID recommendations. -Patient seen by PT OT who are recommending home health therapies.  2.  Streptococcus intermedius bacteremia: -Noted on blood cultures from 08/08/2021 -Concern likely etiology diabetic foot infection. -2D echo done negative for any valvular vegetations. -Repeat blood cultures with no growth to date. -PICC line placed. -ID recommending 4 weeks of IV penicillin and Flagyl. -Currently afebrile.  Due to persistent leukocytosis patient underwent CT abdomen and pelvis with no abscess noted. -Patient noted to have an irregular circumferentially urinary bladder wall thickening probably chronic. -Patient denied any urinary retention, self catheterizes every once in a while and reports having a repeat urethral stent placed over 15 years ago. -Urine cytology ordered. -We will likely need outpatient follow-up with urology.  3.  Left lower extremity cellulitis -Improved.  4.  Elevated liver enzymes -Right upper quadrant ultrasound negative for cholecystitis however did show cholelithiasis. -Asymptomatic. -Outpatient follow-up.  5.  Hyponatremia -Sodium levels trending down currently at 120 this morning. -Patient being followed by nephrology who feel patient has a true hyponatremia with hyperosmolality and inappropriate elevated urine osmolality reflective of SIADH. -Patient was on Lasix 20 mg daily that have been discontinued and patient  given a dose of Samsca x1. -Per  nephrology.  6.  Chronic kidney disease stage IIIa -Stable. -Currently at baseline. -Baseline 1.5-1.7 -Nephrology following.  7.  Type 2 diabetes mellitus with hyperglycemia with chronic kidney disease -Hemoglobin A1c 6.2 08/08/2021. -CBG 134 this morning -SSI.  8.  Hyperlipidemia -Continue to hold statin.  9.  Hypertension -Continue metoprolol, Norvasc.  10.  Hyperkalemia -Resolved. -Avapro discontinued. -   DVT prophylaxis: Lovenox Code Status: Full Family Communication: Updated patient, wife, son at bedside. Disposition:   Status is: Inpatient  Remains inpatient appropriate because:Inpatient level of care appropriate due to severity of illness  Dispo: The patient is from: Home              Anticipated d/c is to: Home              Patient currently is not medically stable to d/c.   Difficult to place patient No       Consultants:  Nephrology: Dr. Allena Katz 08/15/2021 ID: Dr. Daiva Eves 08/08/2021 Podiatry: Dr. Ardelle Anton 08/08/2021    Procedures:  CT abdomen and pelvis 08/15/2021 Plain films of the right foot 08/09/2021 Plain films of the left foot 08/08/2021 MRI right foot 08/08/2021 MRI left foot 08/09/2021 Renal ultrasound 08/14/2021 Abdominal ultrasound 08/08/2021 2D echo 08/10/2021 Lower extremity Dopplers 08/17/2021 ABI 08/09/2021 Incision and drainage right foot wound per podiatry: Dr. Loreta Ave 08/13/2021 Partial first ray amputation right foot, partial second toe amputation, I&D deep abscess right foot per podiatry, Dr. Loreta Ave 08/09/2021 Right upper extremity PICC line placement   Antimicrobials:  IV Ancef 08/09/2021>>>> 08/10/2021 IV Rocephin 08/10/2021>>>>> 08/12/2021 IV penicillin 08/13/2021>>>>>> 09/09/2021 Oral Flagyl 08/12/2021>>>>> 09/09/2021    Subjective: Sitting up in bed.  No chest pain.  No shortness of breath.  No abdominal pain.  Still with some pain in the left lower extremity.  States work with physical therapy early on today initially was dizzy  which subsequently improved and resolved.  Wife and son at bedside.  Objective: Vitals:   08/16/21 0557 08/16/21 1338 08/16/21 2153 08/17/21 0421  BP: 124/69 119/67 135/72 127/70  Pulse: 69 (!) 54 71 62  Resp: 18 16 16 18   Temp: 98 F (36.7 C)  (!) 97.5 F (36.4 C) 98.3 F (36.8 C)  TempSrc: Oral  Oral Oral  SpO2: 96% 99% 97% 99%  Weight:      Height:        Intake/Output Summary (Last 24 hours) at 08/17/2021 1243 Last data filed at 08/17/2021 0424 Gross per 24 hour  Intake 120 ml  Output 1200 ml  Net -1080 ml   Filed Weights   08/08/21 1149 08/08/21 1738 08/09/21 1625  Weight: 112.9 kg 118.6 kg 118.6 kg    Examination:  General exam: Appears calm and comfortable  Respiratory system: Clear to auscultation. Respiratory effort normal. Cardiovascular system: S1 & S2 heard, RRR. No JVD, murmurs, rubs, gallops or clicks. No pedal edema. Gastrointestinal system: Abdomen is nondistended, soft and nontender. No organomegaly or masses felt. Normal bowel sounds heard. Central nervous system: Alert and oriented. No focal neurological deficits. Extremities: Right foot wrapped.  No lower extremity edema.  Skin: No rashes, lesions or ulcers Psychiatry: Judgement and insight appear normal. Mood & affect appropriate.     Data Reviewed: I have personally reviewed following labs and imaging studies  CBC: Recent Labs  Lab 08/11/21 0846 08/12/21 0948 08/14/21 1303 08/15/21 0321 08/16/21 0538 08/17/21 0409  WBC 25.2* 29.1* 28.7* 29.8* 29.2* 26.5*  NEUTROABS 20.7* 22.1* 22.7*  --  22.1* 18.9*  HGB 12.1* 12.3* 11.1* 10.7* 10.8* 10.5*  HCT 35.6* 35.5* 32.5* 31.5* 31.9* 30.7*  MCV 84.8 84.1 85.3 85.8 85.1 84.6  PLT 245 287 336 344 445* 465*    Basic Metabolic Panel: Recent Labs  Lab 08/14/21 1303 08/15/21 0321 08/16/21 0538 08/16/21 0548 08/17/21 0409  NA 123* 122* 124* 124* 120*  K 5.3* 4.9 5.0 4.9 4.9  CL 90* 88* 91* 91* 89*  CO2 25 24 24 24 23   GLUCOSE 347* 180* 158*  160* 127*  BUN 39* 39* 36* 35* 38*  CREATININE 1.53* 1.24 0.99 0.94 1.40*  CALCIUM 9.6 9.0 9.8 9.6 9.4  PHOS  --   --   --  4.7*  --     GFR: Estimated Creatinine Clearance: 71.8 mL/min (A) (by C-G formula based on SCr of 1.4 mg/dL (H)).  Liver Function Tests: Recent Labs  Lab 08/14/21 1303 08/16/21 0548 08/17/21 1100  AST 34  --  27  ALT 45*  --  29  ALKPHOS 178*  --  158*  BILITOT 1.2  --  0.8  PROT 7.0  --  6.8  ALBUMIN 2.2* 2.1* 2.1*    CBG: Recent Labs  Lab 08/16/21 1336 08/16/21 1802 08/16/21 2151 08/17/21 0754 08/17/21 1135  GLUCAP 198* 114* 84 134* 216*     Recent Results (from the past 240 hour(s))  Blood Cultures x 2 sites     Status: Abnormal   Collection Time: 08/08/21  1:07 PM   Specimen: BLOOD  Result Value Ref Range Status   Specimen Description   Final    BLOOD RIGHT ANTECUBITAL Performed at [facility], [address], [place], [place]    Special Requests   Final    BOTTLES DRAWN AEROBIC AND ANAEROBIC Blood Culture results may not be optimal due to an excessive volume of blood received in culture bottles Performed at [facility], [address], [place], [place]    Culture  Setup Time   Final    GRAM POSITIVE COCCI IN BOTH AEROBIC AND ANAEROBIC BOTTLES CRITICAL RESULT CALLED TO, READ BACK BY AND VERIFIED WITH: [id number] PHARMD Janine Limbo 08/09/21 A BROWNING Performed at [facility], [address], [place], [place]    Culture STREPTOCOCCUS INTERMEDIUS (A)  Final   Report Status 08/13/2021 FINAL  Final   Organism ID, Bacteria STREPTOCOCCUS INTERMEDIUS  Final      Susceptibility   Streptococcus intermedius - MIC*    PENICILLIN <=0.06 SENSITIVE Sensitive     CEFTRIAXONE <=0.12 SENSITIVE Sensitive     ERYTHROMYCIN >=8 RESISTANT Resistant     LEVOFLOXACIN <=0.25 SENSITIVE Sensitive     VANCOMYCIN 0.25 SENSITIVE Sensitive     * STREPTOCOCCUS INTERMEDIUS  Blood Cultures x 2  sites     Status: Abnormal   Collection Time: 08/08/21  1:07 PM   Specimen: BLOOD  Result Value Ref Range Status   Specimen Description   Final    BLOOD BLOOD RIGHT FOREARM Performed at [facility], [address], [place], [place]    Special Requests   Final    BOTTLES DRAWN AEROBIC AND ANAEROBIC Blood Culture results may not be optimal due to an excessive volume of blood received in culture bottles Performed at [facility], [address] [place], [place]    Culture  Setup Time   Final    GRAM POSITIVE COCCI ANAEROBIC BOTTLE ONLY CRITICAL  VALUE NOTED.  VALUE IS CONSISTENT WITH PREVIOUSLY REPORTED AND CALLED VALUE.    Culture (A)  Final    STREPTOCOCCUS INTERMEDIUS SUSCEPTIBILITIES PERFORMED ON PREVIOUS CULTURE WITHIN THE LAST 5 DAYS. Performed at [facility], [address] [place], [place] [address]    Report Status 08/12/2021 FINAL  Final  Blood Culture ID Panel (Reflexed)     Status: Abnormal   Collection Time: 08/08/21  1:07 PM  Result Value Ref Range Status   Enterococcus faecalis NOT DETECTED NOT DETECTED Final   Enterococcus Faecium NOT DETECTED NOT DETECTED Final   Listeria monocytogenes NOT DETECTED NOT DETECTED Final   Staphylococcus species NOT DETECTED NOT DETECTED Final   Staphylococcus aureus (BCID) NOT DETECTED NOT DETECTED Final   Staphylococcus epidermidis NOT DETECTED NOT DETECTED Final   Staphylococcus lugdunensis NOT DETECTED NOT DETECTED Final   Streptococcus species DETECTED (A) NOT DETECTED Final    Comment: Not Enterococcus species, Streptococcus agalactiae, Streptococcus pyogenes, or Streptococcus pneumoniae. CRITICAL RESULT CALLED TO, READ BACK BY AND VERIFIED WITH: Janine Limbo PHARMD [id number] 08/09/21 A BROWNING    Streptococcus agalactiae NOT DETECTED NOT DETECTED Final   Streptococcus pneumoniae NOT DETECTED NOT DETECTED Final   Streptococcus pyogenes NOT DETECTED NOT DETECTED Final    A.calcoaceticus-baumannii NOT DETECTED NOT DETECTED Final   Bacteroides fragilis NOT DETECTED NOT DETECTED Final   Enterobacterales NOT DETECTED NOT DETECTED Final   Enterobacter cloacae complex NOT DETECTED NOT DETECTED Final   Escherichia coli NOT DETECTED NOT DETECTED Final   Klebsiella aerogenes NOT DETECTED NOT DETECTED Final   Klebsiella oxytoca NOT DETECTED NOT DETECTED Final   Klebsiella pneumoniae NOT DETECTED NOT DETECTED Final   Proteus species NOT DETECTED NOT DETECTED Final   Salmonella species NOT DETECTED NOT DETECTED Final   Serratia marcescens NOT DETECTED NOT DETECTED Final   Haemophilus influenzae NOT DETECTED NOT DETECTED Final   Neisseria meningitidis NOT DETECTED NOT DETECTED Final   Pseudomonas aeruginosa NOT DETECTED NOT DETECTED Final   Stenotrophomonas maltophilia NOT DETECTED NOT DETECTED Final   Candida albicans NOT DETECTED NOT DETECTED Final   Candida auris NOT DETECTED NOT DETECTED Final   Candida glabrata NOT DETECTED NOT DETECTED Final   Candida krusei NOT DETECTED NOT DETECTED Final   Candida parapsilosis NOT DETECTED NOT DETECTED Final   Candida tropicalis NOT DETECTED NOT DETECTED Final   Cryptococcus neoformans/gattii NOT DETECTED NOT DETECTED Final    Comment: Performed at [facility], [address], [place], [place] [address]  Resp Panel by RT-PCR (Flu A&B, Covid) Nasopharyngeal Swab     Status: None   Collection Time: 08/08/21  3:00 PM   Specimen: Nasopharyngeal Swab; Nasopharyngeal(NP) swabs in vial transport medium  Result Value Ref Range Status   SARS Coronavirus 2 by RT PCR NEGATIVE NEGATIVE Final    Comment: (NOTE) SARS-CoV-2 target nucleic acids are NOT DETECTED.  The SARS-CoV-2 RNA is generally detectable in upper respiratory specimens during the acute phase of infection. The lowest concentration of SARS-CoV-2 viral copies this assay can detect is 138 copies/mL. A negative result does not preclude SARS-Cov-2 infection and  should not be used as the sole basis for treatment or other patient management decisions. A negative result may occur with  improper specimen collection/handling, submission of specimen other than nasopharyngeal swab, presence of viral mutation(s) within the areas targeted by this assay, and inadequate number of viral copies(<138 copies/mL). A negative result must be combined with clinical observations, patient history, and epidemiological information. The expected  result is Negative.  Fact Sheet for Patients:  BloggerCourse.com  Fact Sheet for Healthcare Providers:  SeriousBroker.it  This test is no t yet approved or cleared by the [place] FDA and  has been authorized for detection and/or diagnosis of SARS-CoV-2 by FDA under an Emergency Use Authorization (EUA). This EUA will remain  in effect (meaning this test can be used) for the duration of the COVID-19 declaration under Section 564(b)(1) of the Act, 21 U.S.C.section 360bbb-3(b)(1), unless the authorization is terminated  or revoked sooner.       Influenza A by PCR NEGATIVE NEGATIVE Final   Influenza B by PCR NEGATIVE NEGATIVE Final    Comment: (NOTE) The Xpert Xpress SARS-CoV-2/FLU/RSV plus assay is intended as an aid in the diagnosis of influenza from Nasopharyngeal swab specimens and should not be used as a sole basis for treatment. Nasal washings and aspirates are unacceptable for Xpert Xpress SARS-CoV-2/FLU/RSV testing.  Fact Sheet for Patients: BloggerCourse.com  Fact Sheet for Healthcare Providers: SeriousBroker.it  This test is not yet approved or cleared by the [place] FDA and has been authorized for detection and/or diagnosis of SARS-CoV-2 by FDA under an Emergency Use Authorization (EUA). This EUA will remain in effect (meaning this test can be used) for the duration of the COVID-19 declaration  under Section 564(b)(1) of the Act, 21 U.S.C. section 360bbb-3(b)(1), unless the authorization is terminated or revoked.  Performed at [facility], [address], [place], [place] [address]   Aerobic/Anaerobic Culture w Gram Stain (surgical/deep wound)     Status: None   Collection Time: 08/09/21  6:18 PM   Specimen: Abscess  Result Value Ref Range Status   Specimen Description   Final    ABSCESS RIGHT BIG TOE Performed at [facility], [address], [place], [place] [address]    Special Requests   Final    NONE Performed at [facility], [address], [place], [place] [address]    Gram Stain   Final    RARE WBC PRESENT, PREDOMINANTLY MONONUCLEAR ABUNDANT GRAM POSITIVE COCCI IN PAIRS ABUNDANT GRAM POSITIVE COCCI IN CLUSTERS    Culture   Final    FEW STREPTOCOCCUS AGALACTIAE TESTING AGAINST S. AGALACTIAE NOT ROUTINELY PERFORMED DUE TO PREDICTABILITY OF AMP/PEN/VAN SUSCEPTIBILITY. MODERATE STREPTOCOCCUS INTERMEDIUS FEW PREVOTELLA BIVIA BETA LACTAMASE POSITIVE Performed at [facility], [address] [place], [place] [address]    Report Status 08/15/2021 FINAL  Final   Organism ID, Bacteria STREPTOCOCCUS INTERMEDIUS  Final      Susceptibility   Streptococcus intermedius - MIC*    PENICILLIN <=0.06 SENSITIVE Sensitive     CEFTRIAXONE <=0.12 SENSITIVE Sensitive     ERYTHROMYCIN >=8 RESISTANT Resistant     LEVOFLOXACIN <=0.25 SENSITIVE Sensitive     VANCOMYCIN 0.25 SENSITIVE Sensitive     * MODERATE STREPTOCOCCUS INTERMEDIUS  Culture, blood (Routine X 2) w Reflex to ID Panel     Status: None   Collection Time: 08/11/21 10:37 AM   Specimen: BLOOD  Result Value Ref Range Status   Specimen Description   Final    BLOOD BLOOD RIGHT FOREARM Performed at [facility], [address], [place], [place] [address]    Special Requests   Final    BOTTLES DRAWN AEROBIC AND ANAEROBIC Blood Culture  adequate volume Performed at [facility], [address] [place], [place] [address]    Culture   Final    NO GROWTH  5 DAYS Performed at [facility], [address], [place], [place] [address]    Report Status 08/16/2021 FINAL  Final  Culture, blood (Routine X 2) w Reflex to ID Panel     Status: None   Collection Time: 08/11/21 10:41 AM   Specimen: BLOOD  Result Value Ref Range Status   Specimen Description   Final    BLOOD BLOOD LEFT FOREARM Performed at [facility], [address], [place], [place] [address]    Special Requests   Final    BOTTLES DRAWN AEROBIC AND ANAEROBIC Blood Culture results may not be optimal due to an excessive volume of blood received in culture bottles Performed at [facility], [address], [place], [place] [address]    Culture   Final    NO GROWTH 5 DAYS Performed at [facility], [address], [place], [place] [address]    Report Status 08/16/2021 FINAL  Final         Radiology Studies: CT ABDOMEN PELVIS W CONTRAST  Result Date: 08/15/2021 CLINICAL DATA:  Sepsis Intrabdominal abscess sepsis, concerned for intraabd abscess. Elevated white blood cell count. EXAM: CT ABDOMEN AND PELVIS WITH CONTRAST TECHNIQUE: Multidetector CT imaging of the abdomen and pelvis was performed using the standard protocol following bolus administration of intravenous contrast. CONTRAST:  20mL OMNIPAQUE IOHEXOL 350 MG/ML SOLN COMPARISON:  None. FINDINGS: Lower chest: No acute abnormality. Coronary artery calcifications four-vessel. Hepatobiliary: No focal liver abnormality. Multiple subcentimeter calcified gallstones noted within the gallbladder lumen. No gallbladder wall thickening or pericholecystic fluid. No biliary dilatation. Pancreas: No focal lesion. Normal pancreatic contour. No surrounding inflammatory changes. No main pancreatic ductal dilatation. Spleen: Normal in size without focal  abnormality. Adrenals/Urinary Tract: No nodularity of the right adrenal gland. There is a 1.9 cm left adrenal gland nodule with a density of 28 Hounsfield units. Nonspecific bilateral perinephric stranding. Bilateral kidneys enhance symmetrically. Subcentimeter hypodensities are too small to characterize. No hydronephrosis. No hydroureter. Irregular circumferential urinary bladder wall thickening. Stomach/Bowel: PO contrast reaches the sigmoid colon. Stomach is under distended and within normal limits. No evidence of bowel wall thickening or dilatation. Appendix appears normal. Vascular/Lymphatic: No abdominal aorta or iliac aneurysm. Mild-to-moderate atherosclerotic plaque of the aorta and its branches. No abdominal, pelvic, or inguinal lymphadenopathy. Reproductive: Status post hysterectomy. No adnexal masses. Other: No intraperitoneal free fluid. No intraperitoneal free gas. No organized fluid collection. Musculoskeletal: No abdominal wall hernia or abnormality. Subcutaneus soft tissue edema and emphysema along the bilateral lower anterior abdominal wall likely related to medication injection. Densely sclerotic lesion within the L2 and L5 vertebral bodies likely represent bone islands. No suspicious lytic or blastic osseous lesions. No acute displaced fracture. Multilevel degenerative changes of the spine. IMPRESSION: 1. Irregular circumferential urinary bladder wall thickening. Finding may represent underlying infection or malignancy. Correlate with urinalysis. Consider direct visualization. 2. Subcutaneus soft tissue edema and emphysema along the bilateral lower anterior abdominal wall likely related to medication injection. Correlate with clinical history/physical exam. 3. Cholelithiasis with no CT findings of acute cholecystitis or choledocholithiasis. 4. Indeterminate 1.9 cm left adrenal gland nodule. Recommend CT adrenal gland protocol in 1 year for further evaluation and evaluation of stability. 5. Densely  sclerotic lesion within the L2 and L5 vertebral bodies likely represent bone islands. Recommend attention on follow-up. 6.  Aortic Atherosclerosis (ICD10-I70.0). Electronically Signed   By: Tish Frederickson M.D.   On: 08/15/2021 20:56   VAS [place] LOWER EXTREMITY VENOUS (DVT)  Result Date: 08/17/2021  Lower Venous DVT Study Patient Name:  [name] [name]  Date of Exam:   08/17/2021 Medical Rec #: [id number]       Accession #:    [id number] Date of Birth: September 16, 1956       Patient Gender: M Patient Age:   25 years Exam Location:  [facility] Procedure:      VAS [place] LOWER EXTREMITY VENOUS (DVT) Referring Phys: Kathlen Mody --------------------------------------------------------------------------------  Indications: Pain, and Swelling.  Comparison Study: No previous exams Performing Technologist: Jody Hill RVT, RDMS  Examination Guidelines: A complete evaluation includes B-mode imaging, spectral Doppler, color Doppler, and power Doppler as needed of all accessible portions of each vessel. Bilateral testing is considered an integral part of a complete examination. Limited examinations for reoccurring indications may be performed as noted. The reflux portion of the exam is performed with the patient in reverse Trendelenburg.  +-----+---------------+---------+-----------+----------+--------------+ RIGHTCompressibilityPhasicitySpontaneityPropertiesThrombus Aging +-----+---------------+---------+-----------+----------+--------------+ CFV  Full           Yes      Yes                                 +-----+---------------+---------+-----------+----------+--------------+   +---------+---------------+---------+-----------+----------+--------------+ LEFT     CompressibilityPhasicitySpontaneityPropertiesThrombus Aging +---------+---------------+---------+-----------+----------+--------------+ CFV      Full           Yes      Yes                                  +---------+---------------+---------+-----------+----------+--------------+ SFJ      Full                                                        +---------+---------------+---------+-----------+----------+--------------+ FV Prox  Full           Yes      Yes                                 +---------+---------------+---------+-----------+----------+--------------+ FV Mid   Full           Yes      Yes                                 +---------+---------------+---------+-----------+----------+--------------+ FV DistalFull           Yes      Yes                                 +---------+---------------+---------+-----------+----------+--------------+ PFV      Full                                                        +---------+---------------+---------+-----------+----------+--------------+ POP      Full           Yes      Yes                                 +---------+---------------+---------+-----------+----------+--------------+  PTV      Full                                                        +---------+---------------+---------+-----------+----------+--------------+ PERO     Full                                                        +---------+---------------+---------+-----------+----------+--------------+     Summary: RIGHT: - No evidence of common femoral vein obstruction.  LEFT: - There is no evidence of deep vein thrombosis in the lower extremity. - There is no evidence of superficial venous thrombosis.  - Two large cystic structure is found in the popliteal fossa. Subcutaneous edema in area of calf.  *See table(s) above for measurements and observations.    Preliminary         Scheduled Meds:  amLODipine  10 mg Oral QHS   aspirin  325 mg Oral Daily   buPROPion  300 mg Oral Daily   calcium carbonate  400 mg of elemental calcium Oral BID   Chlorhexidine Gluconate Cloth  6 each Topical Daily   enoxaparin (LOVENOX) injection  60 mg  Subcutaneous Q24H   insulin aspart  0-15 Units Subcutaneous TID WC   insulin aspart  0-5 Units Subcutaneous QHS   insulin aspart  8 Units Subcutaneous TID WC   insulin glargine-yfgn  15 Units Subcutaneous BID   metoprolol tartrate  100 mg Oral BID   metroNIDAZOLE  500 mg Oral Q12H   omega-3 acid ethyl esters  1 g Oral Daily   pantoprazole  40 mg Oral Daily   senna-docusate  2 tablet Oral BID   sodium chloride flush  10-40 mL Intracatheter Q12H   Vitamin D (Ergocalciferol)  50,000 Units Oral Q7 days   Continuous Infusions:  penicillin g continuous IV infusion 9 Million Units (08/17/21 0933)     LOS: 9 days    Time spent: 40 minutes    Ramiro Harvest, MD [facility]   To contact the attending provider between 7A-7P or the covering provider during after hours 7P-7A, please log into the web site [email or web address] and access using universal [place] password for that web site. If you do not have the password, please call the hospital operator.  08/17/2021, 12:43 PM

## 2021-08-17 NOTE — Progress Notes (Signed)
Patient ID: [name] [name], male   DOB: [birth year], 65 y.o.   MRN: [id number] [name] [name] Progress Note   Assessment/ Plan:   1.  Hyponatremia: True hyponatremia with hypo-osmolality and inappropriately elevated urine osmolality reflective of inappropriate ADH activation.  Paradoxical drop of sodium level and abrupt rise of creatinine on furosemide 20mg - will stop fluid restriction and give samsca. 2.  Chronic kidney disease: With intermittent acute kidney injury that appears to be hemodynamically mediated based on episodes of relative hypotension and timing of surgery.  Avoid NSAIDs and iodinated intravenous contrast. 3.  Left lower extremity cellulitis/abscess/osteomyelitis: Status post partial first ray amputation along with second toe amputation and drainage of abscess by podiatry.  Wound management per podiatry and ongoing PT.  4.  Anemia: Likely related to chronic nonhealing foot ulcers and recent surgery, continue to monitor with overt losses from wound drain.   Subjective:   Reports that he continues to feel better, denies any chest pain or shortness of breath. Had sharp pain behind the left leg overnight-- doppler negative for DVT.    Objective:   BP 127/70 (BP Location: Left Arm)   Pulse 62   Temp 98.3 F (36.8 C) (Oral)   Resp 18   Ht 6' (1.829 m)   Wt 118.6 kg   SpO2 99%   BMI 35.46 kg/m   Intake/Output Summary (Last 24 hours) at 08/17/2021 1316 Last data filed at 08/17/2021 0424 Gross per 24 hour  Intake 120 ml  Output 1200 ml  Net -1080 ml   Weight change:   Physical Exam: Gen: Comfortably resting in bed, wife and son at bedside CVS: Pulse regular rhythm and normal rate, S1 and S2 normal Resp: Clear to auscultation bilaterally, no rales/rhonchi Abd: Soft, obese, nontender Ext: Trace lower extremity edema bilaterally.  Right ankle/foot dressing with drain in situ  Imaging: CT ABDOMEN PELVIS W CONTRAST  Result Date: 08/15/2021 CLINICAL DATA:  Sepsis  Intrabdominal abscess sepsis, concerned for intraabd abscess. Elevated white blood cell count. EXAM: CT ABDOMEN AND PELVIS WITH CONTRAST TECHNIQUE: Multidetector CT imaging of the abdomen and pelvis was performed using the standard protocol following bolus administration of intravenous contrast. CONTRAST:  89mL OMNIPAQUE IOHEXOL 350 MG/ML SOLN COMPARISON:  None. FINDINGS: Lower chest: No acute abnormality. Coronary artery calcifications four-vessel. Hepatobiliary: No focal liver abnormality. Multiple subcentimeter calcified gallstones noted within the gallbladder lumen. No gallbladder wall thickening or pericholecystic fluid. No biliary dilatation. Pancreas: No focal lesion. Normal pancreatic contour. No surrounding inflammatory changes. No main pancreatic ductal dilatation. Spleen: Normal in size without focal abnormality. Adrenals/Urinary Tract: No nodularity of the right adrenal gland. There is a 1.9 cm left adrenal gland nodule with a density of 28 Hounsfield units. Nonspecific bilateral perinephric stranding. Bilateral kidneys enhance symmetrically. Subcentimeter hypodensities are too small to characterize. No hydronephrosis. No hydroureter. Irregular circumferential urinary bladder wall thickening. Stomach/Bowel: PO contrast reaches the sigmoid colon. Stomach is under distended and within normal limits. No evidence of bowel wall thickening or dilatation. Appendix appears normal. Vascular/Lymphatic: No abdominal aorta or iliac aneurysm. Mild-to-moderate atherosclerotic plaque of the aorta and its branches. No abdominal, pelvic, or inguinal lymphadenopathy. Reproductive: Status post hysterectomy. No adnexal masses. Other: No intraperitoneal free fluid. No intraperitoneal free gas. No organized fluid collection. Musculoskeletal: No abdominal wall hernia or abnormality. Subcutaneus soft tissue edema and emphysema along the bilateral lower anterior abdominal wall likely related to medication injection. Densely  sclerotic lesion within the L2 and L5 vertebral bodies likely represent bone islands.  No suspicious lytic or blastic osseous lesions. No acute displaced fracture. Multilevel degenerative changes of the spine. IMPRESSION: 1. Irregular circumferential urinary bladder wall thickening. Finding may represent underlying infection or malignancy. Correlate with urinalysis. Consider direct visualization. 2. Subcutaneus soft tissue edema and emphysema along the bilateral lower anterior abdominal wall likely related to medication injection. Correlate with clinical history/physical exam. 3. Cholelithiasis with no CT findings of acute cholecystitis or choledocholithiasis. 4. Indeterminate 1.9 cm left adrenal gland nodule. Recommend CT adrenal gland protocol in 1 year for further evaluation and evaluation of stability. 5. Densely sclerotic lesion within the L2 and L5 vertebral bodies likely represent bone islands. Recommend attention on follow-up. 6.  Aortic Atherosclerosis (ICD10-I70.0). Electronically Signed   By: [name] [name] M.D.   On: 08/15/2021 20:56   VAS [place] LOWER EXTREMITY VENOUS (DVT)  Result Date: 08/17/2021  Lower Venous DVT Study Patient Name:  [name] [name]  Date of Exam:   08/17/2021 Medical Rec #: [id number]       Accession #:    [id number] Date of Birth: 1956-01-22       Patient Gender: M Patient Age:   55 years Exam Location:  [facility] Procedure:      VAS [place] LOWER EXTREMITY VENOUS (DVT) Referring Phys: Kathlen Mody --------------------------------------------------------------------------------  Indications: Pain, and Swelling.  Comparison Study: No previous exams Performing Technologist: [name] [name] RVT, RDMS  Examination Guidelines: A complete evaluation includes B-mode imaging, spectral Doppler, color Doppler, and power Doppler as needed of all accessible portions of each vessel. Bilateral testing is considered an integral part of a complete examination. Limited examinations for reoccurring  indications may be performed as noted. The reflux portion of the exam is performed with the patient in reverse Trendelenburg.  +-----+---------------+---------+-----------+----------+--------------+ RIGHTCompressibilityPhasicitySpontaneityPropertiesThrombus Aging +-----+---------------+---------+-----------+----------+--------------+ CFV  Full           Yes      Yes                                 +-----+---------------+---------+-----------+----------+--------------+   +---------+---------------+---------+-----------+----------+--------------+ LEFT     CompressibilityPhasicitySpontaneityPropertiesThrombus Aging +---------+---------------+---------+-----------+----------+--------------+ CFV      Full           Yes      Yes                                 +---------+---------------+---------+-----------+----------+--------------+ SFJ      Full                                                        +---------+---------------+---------+-----------+----------+--------------+ FV Prox  Full           Yes      Yes                                 +---------+---------------+---------+-----------+----------+--------------+ FV Mid   Full           Yes      Yes                                 +---------+---------------+---------+-----------+----------+--------------+ FV DistalFull  Yes      Yes                                 +---------+---------------+---------+-----------+----------+--------------+ PFV      Full                                                        +---------+---------------+---------+-----------+----------+--------------+ POP      Full           Yes      Yes                                 +---------+---------------+---------+-----------+----------+--------------+ PTV      Full                                                        +---------+---------------+---------+-----------+----------+--------------+ PERO     Full                                                         +---------+---------------+---------+-----------+----------+--------------+     Summary: RIGHT: - No evidence of common femoral vein obstruction.  LEFT: - There is no evidence of deep vein thrombosis in the lower extremity. - There is no evidence of superficial venous thrombosis.  - Two large cystic structure is found in the popliteal fossa. Subcutaneous edema in area of calf.  *See table(s) above for measurements and observations.    Preliminary     Labs: BMET Recent Labs  Lab 08/11/21 0846 08/12/21 0459 08/14/21 1303 08/15/21 0321 08/16/21 0538 08/16/21 0548 08/17/21 0409  NA 126* 127* 123* 122* 124* 124* 120*  K 4.5 5.0 5.3* 4.9 5.0 4.9 4.9  CL 90* 94* 90* 88* 91* 91* 89*  CO2 25 24 25 24 24 24 23   GLUCOSE 347* 221* 347* 180* 158* 160* 127*  BUN 27* 32* 39* 39* 36* 35* 38*  CREATININE 1.17 1.26* 1.53* 1.24 0.99 0.94 1.40*  CALCIUM 8.9 9.5 9.6 9.0 9.8 9.6 9.4  PHOS  --   --   --   --   --  4.7*  --    CBC Recent Labs  Lab 08/12/21 0948 08/14/21 1303 08/15/21 0321 08/16/21 0538 08/17/21 0409  WBC 29.1* 28.7* 29.8* 29.2* 26.5*  NEUTROABS 22.1* 22.7*  --  22.1* 18.9*  HGB 12.3* 11.1* 10.7* 10.8* 10.5*  HCT 35.5* 32.5* 31.5* 31.9* 30.7*  MCV 84.1 85.3 85.8 85.1 84.6  PLT 287 336 344 445* 465*    Medications:     amLODipine  10 mg Oral QHS   aspirin  325 mg Oral Daily   buPROPion  300 mg Oral Daily   calcium carbonate  400 mg of elemental calcium Oral BID   Chlorhexidine Gluconate Cloth  6 each Topical Daily   enoxaparin (LOVENOX) injection  60 mg Subcutaneous Q24H   insulin aspart  0-15  Units Subcutaneous TID WC   insulin aspart  0-5 Units Subcutaneous QHS   insulin aspart  8 Units Subcutaneous TID WC   insulin glargine-yfgn  15 Units Subcutaneous BID   metoprolol tartrate  100 mg Oral BID   metroNIDAZOLE  500 mg Oral Q12H   omega-3 acid ethyl esters  1 g Oral Daily   pantoprazole  40 mg Oral Daily    senna-docusate  2 tablet Oral BID   sodium chloride flush  10-40 mL Intracatheter Q12H   Vitamin D (Ergocalciferol)  50,000 Units Oral Q7 days   Zetta Bills, MD 08/17/2021, 1:16 PM

## 2021-08-17 NOTE — Progress Notes (Signed)
LLE venous duplex has been completed.   Results can be found under chart review under CV PROC. 08/17/2021 10:56 AM Analucia Hush RVT, RDMS

## 2021-08-17 NOTE — Progress Notes (Signed)
Physical Therapy Treatment Patient Details Name: [name] [name] MRN: [id number] DOB: [birth year] Today's Date: 08/17/2021   History of Present Illness Pt s/p R great toe amputation 08/09/21 and R foot I&D 08/13/21.  Pt with hx of CAD, CABG, DM and "bad L knee"  - pt states he can not rely on L knee to support him.    PT Comments    Pt is progressing toward acute PT goals with performance of w/c propulsion ~128ft this session.  Pt still with some difficulty maintain NWB on Rt foot with lateral scooting, darko shoe donned today. Verbal and visual cues needed for safety and proper lateral weight shifitng of hips as pt has a tendency to scoot anteriorly at the same time and move too far to th edge of bed/chair surface. Multimodal cues provided for w/c management and propulsion (obstacle negotation, turns, and backward navigation, verbal and visual cues for locking of wheels). PT educated pt and his family on importance of continued mobility and strengthening of B UE/LEs to maximize safety and independence with transfers. Pt will benefit from continued skilled PT to increase their independence and maximize safety with mobility.    Recommendations for follow up therapy are one component of a multi-disciplinary discharge planning process, led by the attending physician.  Recommendations may be updated based on patient status, additional functional criteria and insurance authorization.  Follow Up Recommendations  Home health PT     Equipment Recommendations  Rolling walker with 5" wheels;Wheelchair (measurements PT);Wheelchair cushion (measurements PT) (elevated leg rests)    Recommendations for Other Services OT consult     Precautions / Restrictions Precautions Precautions: Fall Required Braces or Orthoses: Other Brace Other Brace: Darko shoe for R foot Restrictions Weight Bearing Restrictions: Yes RLE Weight Bearing: Non weight bearing     Mobility  Bed Mobility Overal bed mobility: Needs  Assistance Bed Mobility: Supine to Sit;Sit to Supine     Supine to sit: HOB elevated;Supervision Sit to supine: Min guard;HOB elevated   General bed mobility comments: pt using bed rail and UEs to to assist with bringing trunk to upright and progress LEs to EOB and requires increased time. Pt and family edcuated on donning and doffing Darko shoe for Rt foot. MIN guard provided in addition to pt with use of UEs for progression of LEs back onto bed.    Transfers Overall transfer level: Needs assistance Equipment used: None Transfers: Lateral/Scoot Transfers          Lateral/Scoot Transfers: Mod assist General transfer comment: pt able to use bil UE's and Lt LE to complete multiple lateral scoots to move EOB to w/c. Pt with some difficulty maintain NWB on Rt foot with lateral scooting, darko shoe donned. Verbal and visual cues needed for safety and proper lateral weight shifitng of hips as pt with tendency to scoot anteriorly at the same time and move too far to edge.  Ambulation/Gait                 Psychologist, counselling mobility: Yes Wheelchair propulsion: Both upper extremities Wheelchair parts: Needs assistance Distance: 131ft Wheelchair Assistance Details (indicate cue type and reason): Pt requiring assist for adjustment of elevating leg rests and removing arm rests for performance of transfers. Cues provided for w/c management and propulsion (obstacle negotation, turns, and backward navigation, verbal and visual cues for locking of wheels). Pt with good carryover within session following  cuing.  Modified Rankin (Stroke Patients Only)       Balance Overall balance assessment: Needs assistance Sitting-balance support: Bilateral upper extremity supported Sitting balance-Leahy Scale: Good                                      Cognition Arousal/Alertness: Awake/alert Behavior During Therapy: WFL  for tasks assessed/performed Overall Cognitive Status: Within Functional Limits for tasks assessed                                        Exercises General Exercises - Lower Extremity Quad Sets: AROM;Both;10 reps;Supine Long Arc Quad: AAROM;Both;10 reps;Seated Hip Flexion/Marching: AAROM;Both;10 reps;Seated    General Comments General comments (skin integrity, edema, etc.): Pt's family inquiring about bracing for Lt knee support, pt currently has compression sleeve. Therapist informing pt and family on options of KI for promtion of knee extension in standing if he progresses to stand pivot transfers as pt and family report that pt's Lt knee buckles and is weak. PT educating pt and family on continued training of lateral scooting transfers while adhering to NWB precautions this session as safest option for fall prevention/to maximze pt safety with current impairments in LE strength.      Pertinent Vitals/Pain Pain Assessment: Faces Faces Pain Scale: Hurts a little bit Pain Location: R foot Pain Descriptors / Indicators: Aching Pain Intervention(s): Limited activity within patient's tolerance;Monitored during session;Repositioned    Home Living                      Prior Function            PT Goals (current goals can now be found in the care plan section) Acute Rehab PT Goals Patient Stated Goal: HOME with spouse PT Goal Formulation: With patient Time For Goal Achievement: 08/28/21 Potential to Achieve Goals: Good Progress towards PT goals: Progressing toward goals    Frequency    Min 3X/week      PT Plan Current plan remains appropriate    Co-evaluation              AM-PAC PT "6 Clicks" Mobility   Outcome Measure  Help needed turning from your back to your side while in a flat bed without using bedrails?: A Little Help needed moving from lying on your back to sitting on the side of a flat bed without using bedrails?: A Little Help  needed moving to and from a bed to a chair (including a wheelchair)?: A Lot Help needed standing up from a chair using your arms (e.g., wheelchair or bedside chair)?: Total Help needed to walk in hospital room?: Total Help needed climbing 3-5 steps with a railing? : Total 6 Click Score: 11    End of Session Equipment Utilized During Treatment: Gait belt Activity Tolerance: Patient tolerated treatment well Patient left: in bed;with bed alarm set;with call bell/phone within reach;with family/visitor present Nurse Communication: Mobility status PT Visit Diagnosis: Muscle weakness (generalized) (M62.81);Difficulty in walking, not elsewhere classified (R26.2);Pain Pain - Right/Left: Right Pain - part of body: Ankle and joints of foot     Time: 2706-2376 PT Time Calculation (min) (ACUTE ONLY): 57 min  Charges:  $Therapeutic Exercise: 8-22 mins $Therapeutic Activity: 23-37 mins $Wheel Chair Management: 8-22 mins  Lyman Speller., PT, DPT  Acute Rehabilitation Services  Office [phone]   08/17/2021, 6:59 PM

## 2021-08-17 NOTE — Progress Notes (Signed)
Subjective: POD # 4 s/p return to the operating room for incision and drainage, debridement, washout of right foot infection.  Right foot feeling okay he has pain in the left leg in the mid calf  Objective: AAO x3, NAD-wife is at bedside DP/PT pulses palpable bilaterally, CRT less than 3 seconds Changed the dressing today.  Small amount of bloody drainage from the dorsal incision.  I removed 2 sutures and packed it with Aquacel.  Begin daily packing of this.  JP drain removed.  His left leg appears to be pain along the calf muscle.      A/P POD 3 from repeat debridement and washout  -JP drain removed -Daily dressing changes with packing to dorsal incision -His left calf pain appears to be muscular versus possible nerve pain.  No cellulitis or evidence of abscess.  DVT ultrasound was ordered and I think this is reasonable to check -WBC downtrending today, hopeful to see 1 more downtrend tomorrow and then can go home  Sharl Ma, [facility] 08/17/2021

## 2021-08-18 DIAGNOSIS — N179 Acute kidney failure, unspecified: Secondary | ICD-10-CM | POA: Diagnosis not present

## 2021-08-18 DIAGNOSIS — L03115 Cellulitis of right lower limb: Secondary | ICD-10-CM | POA: Diagnosis not present

## 2021-08-18 DIAGNOSIS — R7881 Bacteremia: Secondary | ICD-10-CM | POA: Diagnosis not present

## 2021-08-18 DIAGNOSIS — L03116 Cellulitis of left lower limb: Secondary | ICD-10-CM | POA: Diagnosis not present

## 2021-08-18 LAB — CBC WITH DIFFERENTIAL/PLATELET
Abs Immature Granulocytes: 1.77 10*3/uL — ABNORMAL HIGH (ref 0.00–0.07)
Basophils Absolute: 0.1 10*3/uL (ref 0.0–0.1)
Basophils Relative: 0 %
Eosinophils Absolute: 0.2 10*3/uL (ref 0.0–0.5)
Eosinophils Relative: 1 %
HCT: 30.5 % — ABNORMAL LOW (ref 39.0–52.0)
Hemoglobin: 10.2 g/dL — ABNORMAL LOW (ref 13.0–17.0)
Immature Granulocytes: 8 %
Lymphocytes Relative: 11 %
Lymphs Abs: 2.4 10*3/uL (ref 0.7–4.0)
MCH: 28.3 pg (ref 26.0–34.0)
MCHC: 33.4 g/dL (ref 30.0–36.0)
MCV: 84.5 fL (ref 80.0–100.0)
Monocytes Absolute: 1.7 10*3/uL — ABNORMAL HIGH (ref 0.1–1.0)
Monocytes Relative: 8 %
Neutro Abs: 16.1 10*3/uL — ABNORMAL HIGH (ref 1.7–7.7)
Neutrophils Relative %: 72 %
Platelets: 479 10*3/uL — ABNORMAL HIGH (ref 150–400)
RBC: 3.61 MIL/uL — ABNORMAL LOW (ref 4.22–5.81)
RDW: 13.7 % (ref 11.5–15.5)
WBC: 22.2 10*3/uL — ABNORMAL HIGH (ref 4.0–10.5)
nRBC: 0 % (ref 0.0–0.2)

## 2021-08-18 LAB — BASIC METABOLIC PANEL
Anion gap: 10 (ref 5–15)
BUN: 46 mg/dL — ABNORMAL HIGH (ref 8–23)
CO2: 23 mmol/L (ref 22–32)
Calcium: 9.3 mg/dL (ref 8.9–10.3)
Chloride: 91 mmol/L — ABNORMAL LOW (ref 98–111)
Creatinine, Ser: 1.41 mg/dL — ABNORMAL HIGH (ref 0.61–1.24)
GFR, Estimated: 56 mL/min — ABNORMAL LOW (ref >60)
Glucose, Bld: 149 mg/dL — ABNORMAL HIGH (ref 70–99)
Potassium: 5.2 mmol/L — ABNORMAL HIGH (ref 3.5–5.1)
Sodium: 124 mmol/L — ABNORMAL LOW (ref 135–145)

## 2021-08-18 LAB — GLUCOSE, CAPILLARY
Glucose-Capillary: 175 mg/dL — ABNORMAL HIGH (ref 70–99)
Glucose-Capillary: 151 mg/dL — ABNORMAL HIGH (ref 70–99)
Glucose-Capillary: 154 mg/dL — ABNORMAL HIGH (ref 70–99)
Glucose-Capillary: 167 mg/dL — ABNORMAL HIGH (ref 70–99)

## 2021-08-18 LAB — SODIUM: Sodium: 124 mmol/L — ABNORMAL LOW (ref 135–145)

## 2021-08-18 LAB — C-REACTIVE PROTEIN: CRP: 9.6 mg/dL — ABNORMAL HIGH (ref <1.0)

## 2021-08-18 LAB — CYTOLOGY - NON PAP

## 2021-08-18 MED ORDER — SODIUM CHLORIDE 0.9 % IV SOLN
2.0000 g | INTRAVENOUS | Status: DC
  Administered 2021-08-18 – 2021-08-21 (×4): 2 g via INTRAVENOUS
  Filled 2021-08-18 (×4): qty 20

## 2021-08-18 MED ORDER — SODIUM CHLORIDE 0.9 % IV SOLN: Freq: Two times a day (BID) | INTRAVENOUS | Status: DC

## 2021-08-18 MED ORDER — TOLVAPTAN 15 MG PO TABS
15.0000 mg | ORAL_TABLET | Freq: Once | ORAL | Status: AC
  Administered 2021-08-18: 15 mg via ORAL
  Filled 2021-08-18: qty 1

## 2021-08-18 NOTE — Progress Notes (Signed)
Pharmacy note - antibiotics  Patient is on penicillin 18 million units daily as a continuous infusion which also contains 30 meq of potassium daily.  His potassium has been near the upper limit of normal (5.2 today).  We investigated changing to penicillin sodium, but this salt form is in short supply.  I discussed options with Dr. Renold Don and we are changing his antibiotics to ceftriaxone 2g IV q24h to avoid the potassium load.  [facility] orders have been updated and Jeri Modena from home care has also been notified.  Celedonio Miyamoto, PharmD, BCPS-AQ ID Clinical Pharmacist Phone [phone]

## 2021-08-18 NOTE — Progress Notes (Signed)
Spoke with patient by phone he is doing well his WBC is downtrending.  We will see him tomorrow if still in house but does not need to wait for me for discharge.  Continue every other day dressing changes at home with packing to dorsal incision.  Sharl Ma, DPM 08/18/2021

## 2021-08-18 NOTE — Progress Notes (Signed)
Patient ID: [name] [name], male   DOB: May 28, 1956, 65 y.o.   MRN: [id number] [facility] Progress Note   Assessment/ Plan:   1.  Hyponatremia: True hyponatremia with hypo-osmolality and inappropriately elevated urine osmolality reflective of inappropriate ADH activation.  He had a surprising drop of sodium level with attempted furosemide use 48 hours ago and was placed on tolvaptan yesterday with corresponding improvement of sodium from 120-124 this morning.  We will repeat a dose of tolvaptan. 2.  Chronic kidney disease: With intermittent acute kidney injury that appears to be hemodynamically mediated based on episodes of relative hypotension and timing of surgery-renal function essentially unchanged overnight and I agree with modifying carrier fluid/penicillin type to help limit water and potassium load respectively.  Avoid NSAIDs and iodinated intravenous contrast. 3.  Left lower extremity cellulitis/abscess/osteomyelitis: Status post partial first ray amputation along with second toe amputation and drainage of abscess by podiatry.  Plans noted for outpatient follow-up with podiatry. 4.  Anemia: Likely related to chronic nonhealing foot ulcers and recent surgery, continue to monitor with overt losses from wound drain.   Subjective:   Denies any acute events overnight, he does not have any chest pain or shortness of breath.   Objective:   BP 118/73 (BP Location: Left Arm)   Pulse 81   Temp 97.6 F (36.4 C) (Oral)   Resp 16   Ht 6' (1.829 m)   Wt 118.6 kg   SpO2 97%   BMI 35.46 kg/m   Intake/Output Summary (Last 24 hours) at 08/18/2021 1144 Last data filed at 08/18/2021 0803 Gross per 24 hour  Intake --  Output 1900 ml  Net -1900 ml   Weight change:   Physical Exam: Gen: Comfortably resting in bed, NT at bedside CVS: Pulse regular rhythm and normal rate, S1 and S2 normal Resp: Clear to auscultation bilaterally, no rales/rhonchi Abd: Soft, obese, nontender Ext:  Trace lower extremity edema bilaterally.  Right ankle/foot dressing intact-second toe with intact sutures appearing dusky in color.  Imaging: VAS [place] LOWER EXTREMITY VENOUS (DVT)  Result Date: 08/17/2021  Lower Venous DVT Study Patient Name:  [name] [name]  Date of Exam:   08/17/2021 Medical Rec #: [id number]       Accession #:    [id number] Date of Birth: [birth year]       Patient Gender: M Patient Age:   72 years Exam Location:  [facility] Procedure:      VAS [place] LOWER EXTREMITY VENOUS (DVT) Referring Phys: Kathlen Mody --------------------------------------------------------------------------------  Indications: Pain, and Swelling.  Comparison Study: No previous exams Performing Technologist: Jody Hill RVT, RDMS  Examination Guidelines: A complete evaluation includes B-mode imaging, spectral Doppler, color Doppler, and power Doppler as needed of all accessible portions of each vessel. Bilateral testing is considered an integral part of a complete examination. Limited examinations for reoccurring indications may be performed as noted. The reflux portion of the exam is performed with the patient in reverse Trendelenburg.  +-----+---------------+---------+-----------+----------+--------------+ RIGHTCompressibilityPhasicitySpontaneityPropertiesThrombus Aging +-----+---------------+---------+-----------+----------+--------------+ CFV  Full           Yes      Yes                                 +-----+---------------+---------+-----------+----------+--------------+   +---------+---------------+---------+-----------+----------+--------------+ LEFT     CompressibilityPhasicitySpontaneityPropertiesThrombus Aging +---------+---------------+---------+-----------+----------+--------------+ CFV      Full           Yes  Yes                                 +---------+---------------+---------+-----------+----------+--------------+ SFJ      Full                                                         +---------+---------------+---------+-----------+----------+--------------+ FV Prox  Full           Yes      Yes                                 +---------+---------------+---------+-----------+----------+--------------+ FV Mid   Full           Yes      Yes                                 +---------+---------------+---------+-----------+----------+--------------+ FV DistalFull           Yes      Yes                                 +---------+---------------+---------+-----------+----------+--------------+ PFV      Full                                                        +---------+---------------+---------+-----------+----------+--------------+ POP      Full           Yes      Yes                                 +---------+---------------+---------+-----------+----------+--------------+ PTV      Full                                                        +---------+---------------+---------+-----------+----------+--------------+ PERO     Full                                                        +---------+---------------+---------+-----------+----------+--------------+     Summary: RIGHT: - No evidence of common femoral vein obstruction.  LEFT: - There is no evidence of deep vein thrombosis in the lower extremity. - There is no evidence of superficial venous thrombosis.  - Two large cystic structure is found in the popliteal fossa. Subcutaneous edema in area of calf.  *See table(s) above for measurements and observations. Electronically signed by Heath Lark on 08/17/2021 at 5:26:59 PM.    Final     Labs: BMET Recent Labs  Lab 08/12/21 2952 08/14/21 1303 08/15/21 0321 08/16/21 8413  08/16/21 0548 08/17/21 0409 08/17/21 1650 08/18/21 0110 08/18/21 0445  NA 127* 123* 122* 124* 124* 120* 124* 124* 124*  K 5.0 5.3* 4.9 5.0 4.9 4.9  --   --  5.2*  CL 94* 90* 88* 91* 91* 89*  --   --  91*  CO2 24 25 24 24 24 23   --   --  23   GLUCOSE 221* 347* 180* 158* 160* 127*  --   --  149*  BUN 32* 39* 39* 36* 35* 38*  --   --  46*  CREATININE 1.26* 1.53* 1.24 0.99 0.94 1.40*  --   --  1.41*  CALCIUM 9.5 9.6 9.0 9.8 9.6 9.4  --   --  9.3  PHOS  --   --   --   --  4.7*  --   --   --   --    CBC Recent Labs  Lab 08/14/21 1303 08/15/21 0321 08/16/21 0538 08/17/21 0409 08/18/21 0445  WBC 28.7* 29.8* 29.2* 26.5* 22.2*  NEUTROABS 22.7*  --  22.1* 18.9* 16.1*  HGB 11.1* 10.7* 10.8* 10.5* 10.2*  HCT 32.5* 31.5* 31.9* 30.7* 30.5*  MCV 85.3 85.8 85.1 84.6 84.5  PLT 336 344 445* 465* 479*    Medications:     amLODipine  10 mg Oral QHS   aspirin  325 mg Oral Daily   buPROPion  300 mg Oral Daily   calcium carbonate  400 mg of elemental calcium Oral BID   Chlorhexidine Gluconate Cloth  6 each Topical Daily   enoxaparin (LOVENOX) injection  60 mg Subcutaneous Q24H   insulin aspart  0-15 Units Subcutaneous TID WC   insulin aspart  0-5 Units Subcutaneous QHS   insulin aspart  8 Units Subcutaneous TID WC   insulin glargine-yfgn  15 Units Subcutaneous BID   metoprolol tartrate  100 mg Oral BID   metroNIDAZOLE  500 mg Oral Q12H   omega-3 acid ethyl esters  1 g Oral Daily   pantoprazole  40 mg Oral Daily   senna-docusate  2 tablet Oral BID   sodium chloride flush  10-40 mL Intracatheter Q12H   Vitamin D (Ergocalciferol)  50,000 Units Oral Q7 days   08/20/21, MD 08/18/2021, 11:44 AM

## 2021-08-18 NOTE — Progress Notes (Signed)
PHARMACY CONSULT NOTE FOR:  OUTPATIENT  PARENTERAL ANTIBIOTIC THERAPY (OPAT)  Indication: streptococcal bacteremia Regimen: ceftriaxone 2g IV q24h End date: 09/08/2021  IV antibiotic discharge orders are pended. To discharging provider:  please sign these orders via discharge navigator,  Select New Orders & click on the button choice - Manage This Unsigned Work.     Thank you for allowing pharmacy to be a part of this patient's care.  [name] [name] 08/18/2021, 12:59 PM

## 2021-08-18 NOTE — Progress Notes (Signed)
PROGRESS NOTE    [name] [name]  VWU:[id number] DOB: 1956-09-18 DOA: 08/08/2021 PCP: Tally Joe, MD    Chief Complaint  Patient presents with   Wound Infection    Brief Narrative:  65 year old gentleman prior history of hypertension, type 2 diabetes, hyperlipidemia presents to the hospital with left lower extremity cellulitis as well as right great toe nonhealing wound with pain and tenderness.  Patient reports that over the last 1 week prior to admission patient had worsening erythema pain and foul-smelling discharge from the right toe.  He follows up with wound care.  Podiatry and ID were consulted and patient underwent partial first ray amputation of the right foot, partial second toe amputation and debridement of the deep abscess in the right foot by Dr. Loreta Ave on 08/09/2021 and repeat debridement. ID on board, recommended 4 weeks IV Penicillin and oral flagyl.  Pt would be stable for discharge when his sodium improves.    Assessment & Plan:   Active Problems:   Toe infection   Cellulitis of right lower extremity   Osteomyelitis of great toe of right foot (HCC)   Osteomyelitis of second toe of right foot (HCC)   Diabetes mellitus due to underlying condition with diabetic autonomic neuropathy, without long-term current use of insulin (HCC)   Class 3 severe obesity with serious comorbidity and body mass index (BMI) of 45.0 to 49.9 in adult [facility])   Elevated LFTs   Post-operative state   Cellulitis of left lower extremity   Streptococcal bacteremia   AKI (acute kidney injury) (HCC)  #1 right foot ulcer/wound concerning for osteomyelitis -MRI on admission of the right foot concerning for septic arthritis and osteomyelitis of the first proximal and distal phalanges as well as osteomyelitis of the second distal phalanx.  MRI also showing infectious tenosynovitis around the first metatarsal and multiple forefoot abscesses. -Patient seen in consultation by ID and podiatry. -Patient  status post partial first ray amputation, partial second toe amputation and debridement of deep abscess of the right foot per Dr. Ardelle Anton 08/09/2021. -Due to worsening leukocytosis patient underwent repeat debridement of the right foot on 08/13/2021. -MRI left foot negative for osteomyelitis. -Drawn on from right foot showed Streptococcus intermedius sensitive to penicillin. -ID following and patient initially started on IV Rocephin, subsequently transition to IV penicillin and Flagyl to complete a 4-week course of antibiotic treatment per ID recommendations. -Patient seen by PT/ OT who are recommending home health therapies.  2.  Streptococcus intermedius bacteremia: -Noted on blood cultures from 08/08/2021 -Concern likely etiology diabetic foot infection. -2D echo done negative for any valvular vegetations. -Repeat blood cultures with no growth to date. -PICC line placed. -ID recommending 4 weeks of IV penicillin and Flagyl. -Currently afebrile.  Due to persistent leukocytosis patient underwent CT abdomen and pelvis with no abscess noted. -Patient noted to have an irregular circumferentially urinary bladder wall thickening probably chronic. -Patient denied any urinary retention, self catheterizes every once in a while and reports having a repeat urethral stent placed over 15 years ago. -Urine cytology ordered. -We will likely need outpatient follow-up with urology.  3.  Left lower extremity cellulitis -Improved. -On empiric IV antibiotics.  4.  Elevated liver enzymes -Right upper quadrant ultrasound negative for cholecystitis however did show cholelithiasis. -Asymptomatic. -Outpatient follow-up.  5.  Hyponatremia -Sodium levels trending back up currently at 124 from 120 08/17/2021 after a dose of Samsca. -Patient being followed by nephrology who feel patient has a true hyponatremia with hyperosmolality and inappropriate elevated  urine osmolality reflective of SIADH. -Patient was on  Lasix 20 mg daily with last dose 08/16/2021 which was subsequently discontinued due to worsening hyponatremia.   -Another dose of Samsca ordered per nephrology today.   -Repeat labs in the a.m. -Per nephrology.  6.  Chronic kidney disease stage IIIa -Stable. -Currently at baseline. -Baseline 1.5-1.7 -Creatinine at 1.41. -Nephrology following.  7.  Type 2 diabetes mellitus with hyperglycemia with chronic kidney disease -Hemoglobin A1c 6.2 08/08/2021. -CBG 175. -SSI.  8.  Hyperlipidemia -Statin.   9.  Hypertension -Controlled on current regimen of metoprolol, Norvasc.   10.  Hyperkalemia -Potassium of 5.2 today.   -Avapro discontinued.   -Change diet to a renal diet. -Repeat labs in the morning.   DVT prophylaxis: Lovenox Code Status: Full Family Communication: Updated patient.  No family at bedside. Disposition:   Status is: Inpatient  Remains inpatient appropriate because:Inpatient level of care appropriate due to severity of illness  Dispo: The patient is from: Home              Anticipated d/c is to: Home              Patient currently is not medically stable to d/c.   Difficult to place patient No       Consultants:  Nephrology: Dr. Allena Katz 08/15/2021 ID: Dr. Daiva Eves 08/08/2021 Podiatry: Dr. Ardelle Anton 08/08/2021    Procedures:  CT abdomen and pelvis 08/15/2021 Plain films of the right foot 08/09/2021 Plain films of the left foot 08/08/2021 MRI right foot 08/08/2021 MRI left foot 08/09/2021 Renal ultrasound 08/14/2021 Abdominal ultrasound 08/08/2021 2D echo 08/10/2021 Lower extremity Dopplers 08/17/2021 ABI 08/09/2021 Incision and drainage right foot wound per podiatry: Dr. Loreta Ave 08/13/2021 Partial first ray amputation right foot, partial second toe amputation, I&D deep abscess right foot per podiatry, Dr. Loreta Ave 08/09/2021 Right upper extremity PICC line placement   Antimicrobials:  IV Ancef 08/09/2021>>>> 08/10/2021 IV Rocephin 08/10/2021>>>>> 08/12/2021 IV  penicillin 08/13/2021>>>>>> 09/09/2021 Oral Flagyl 08/12/2021>>>>> 09/09/2021    Subjective: Patient sitting up in bed.  No chest pain.  No shortness of breath.  No abdominal pain.  Overall feeling better.    Objective: Vitals:   08/16/21 2153 08/17/21 0421 08/17/21 1343 08/17/21 2055  BP: 135/72 127/70 114/70 118/73  Pulse: 71 62 62 81  Resp: 16 18 18 16   Temp: (!) 97.5 F (36.4 C) 98.3 F (36.8 C) 97.8 F (36.6 C) 97.6 F (36.4 C)  TempSrc: Oral Oral Oral Oral  SpO2: 97% 99% 100% 97%  Weight:      Height:        Intake/Output Summary (Last 24 hours) at 08/18/2021 1337 Last data filed at 08/18/2021 1327 Gross per 24 hour  Intake --  Output 2200 ml  Net -2200 ml    Filed Weights   08/08/21 1149 08/08/21 1738 08/09/21 1625  Weight: 112.9 kg 118.6 kg 118.6 kg    Examination:  General exam: NAD Respiratory system: CTA B.  No wheezes, no crackles, no rhonchi.  Normal respiratory effort.  Cardiovascular system: RRR no murmurs rubs or gallops.  No JVD.  No lower extremity edema.  Gastrointestinal system: Abdomen is soft, nontender, nondistended, positive bowel sounds.  No rebound.  No guarding.   Central nervous system: Alert and oriented. No focal neurological deficits. Extremities: Right foot wrapped.  No lower extremity edema.  Skin: No rashes, lesions or ulcers Psychiatry: Judgement and insight appear normal. Mood & affect appropriate.     Data Reviewed: I have  personally reviewed following labs and imaging studies  CBC: Recent Labs  Lab 08/12/21 0948 08/14/21 1303 08/15/21 0321 08/16/21 0538 08/17/21 0409 08/18/21 0445  WBC 29.1* 28.7* 29.8* 29.2* 26.5* 22.2*  NEUTROABS 22.1* 22.7*  --  22.1* 18.9* 16.1*  HGB 12.3* 11.1* 10.7* 10.8* 10.5* 10.2*  HCT 35.5* 32.5* 31.5* 31.9* 30.7* 30.5*  MCV 84.1 85.3 85.8 85.1 84.6 84.5  PLT 287 336 344 445* 465* 479*     Basic Metabolic Panel: Recent Labs  Lab 08/15/21 0321 08/16/21 0538 08/16/21 0548  08/17/21 0409 08/17/21 1650 08/18/21 0110 08/18/21 0445  NA 122* 124* 124* 120* 124* 124* 124*  K 4.9 5.0 4.9 4.9  --   --  5.2*  CL 88* 91* 91* 89*  --   --  91*  CO2 24 24 24 23   --   --  23  GLUCOSE 180* 158* 160* 127*  --   --  149*  BUN 39* 36* 35* 38*  --   --  46*  CREATININE 1.24 0.99 0.94 1.40*  --   --  1.41*  CALCIUM 9.0 9.8 9.6 9.4  --   --  9.3  PHOS  --   --  4.7*  --   --   --   --      GFR: Estimated Creatinine Clearance: 71.3 mL/min (A) (by C-G formula based on SCr of 1.41 mg/dL (H)).  Liver Function Tests: Recent Labs  Lab 08/14/21 1303 08/16/21 0548 08/17/21 1100  AST 34  --  27  ALT 45*  --  29  ALKPHOS 178*  --  158*  BILITOT 1.2  --  0.8  PROT 7.0  --  6.8  ALBUMIN 2.2* 2.1* 2.1*     CBG: Recent Labs  Lab 08/17/21 1135 08/17/21 1705 08/17/21 2052 08/18/21 0744 08/18/21 1142  GLUCAP 216* 96 185* 175* 151*      Recent Results (from the past 240 hour(s))  Resp Panel by RT-PCR (Flu A&B, Covid) Nasopharyngeal Swab     Status: None   Collection Time: 08/08/21  3:00 PM   Specimen: Nasopharyngeal Swab; Nasopharyngeal(NP) swabs in vial transport medium  Result Value Ref Range Status   SARS Coronavirus 2 by RT PCR NEGATIVE NEGATIVE Final    Comment: (NOTE) SARS-CoV-2 target nucleic acids are NOT DETECTED.  The SARS-CoV-2 RNA is generally detectable in upper respiratory specimens during the acute phase of infection. The lowest concentration of SARS-CoV-2 viral copies this assay can detect is 138 copies/mL. A negative result does not preclude SARS-Cov-2 infection and should not be used as the sole basis for treatment or other patient management decisions. A negative result may occur with  improper specimen collection/handling, submission of specimen other than nasopharyngeal swab, presence of viral mutation(s) within the areas targeted by this assay, and inadequate number of viral copies(<138 copies/mL). A negative result must be combined  with clinical observations, patient history, and epidemiological information. The expected result is Negative.  Fact Sheet for Patients:  08/10/21  Fact Sheet for Healthcare Providers:  BloggerCourse.com  This test is no t yet approved or cleared by the [place] FDA and  has been authorized for detection and/or diagnosis of SARS-CoV-2 by FDA under an Emergency Use Authorization (EUA). This EUA will remain  in effect (meaning this test can be used) for the duration of the COVID-19 declaration under Section 564(b)(1) of the Act, 21 U.S.C.section 360bbb-3(b)(1), unless the authorization is terminated  or revoked sooner.  Influenza A by PCR NEGATIVE NEGATIVE Final   Influenza B by PCR NEGATIVE NEGATIVE Final    Comment: (NOTE) The Xpert Xpress SARS-CoV-2/FLU/RSV plus assay is intended as an aid in the diagnosis of influenza from Nasopharyngeal swab specimens and should not be used as a sole basis for treatment. Nasal washings and aspirates are unacceptable for Xpert Xpress SARS-CoV-2/FLU/RSV testing.  Fact Sheet for Patients: BloggerCourse.com  Fact Sheet for Healthcare Providers: SeriousBroker.it  This test is not yet approved or cleared by the [place] FDA and has been authorized for detection and/or diagnosis of SARS-CoV-2 by FDA under an Emergency Use Authorization (EUA). This EUA will remain in effect (meaning this test can be used) for the duration of the COVID-19 declaration under Section 564(b)(1) of the Act, 21 U.S.C. section 360bbb-3(b)(1), unless the authorization is terminated or revoked.  Performed at [facility], [address], [place], [place] [address]   Aerobic/Anaerobic Culture w Gram Stain (surgical/deep wound)     Status: None   Collection Time: 08/09/21  6:18 PM   Specimen: Abscess  Result Value Ref Range Status    Specimen Description   Final    ABSCESS RIGHT BIG TOE Performed at [facility], [address], [place], [place] [address]    Special Requests   Final    NONE Performed at [facility], [address] [place], [place] [address]    Gram Stain   Final    RARE WBC PRESENT, PREDOMINANTLY MONONUCLEAR ABUNDANT GRAM POSITIVE COCCI IN PAIRS ABUNDANT GRAM POSITIVE COCCI IN CLUSTERS    Culture   Final    FEW STREPTOCOCCUS AGALACTIAE TESTING AGAINST S. AGALACTIAE NOT ROUTINELY PERFORMED DUE TO PREDICTABILITY OF AMP/PEN/VAN SUSCEPTIBILITY. MODERATE STREPTOCOCCUS INTERMEDIUS FEW PREVOTELLA BIVIA BETA LACTAMASE POSITIVE Performed at [facility], [address] [place], [place] [address]    Report Status 08/15/2021 FINAL  Final   Organism ID, Bacteria STREPTOCOCCUS INTERMEDIUS  Final      Susceptibility   Streptococcus intermedius - MIC*    PENICILLIN <=0.06 SENSITIVE Sensitive     CEFTRIAXONE <=0.12 SENSITIVE Sensitive     ERYTHROMYCIN >=8 RESISTANT Resistant     LEVOFLOXACIN <=0.25 SENSITIVE Sensitive     VANCOMYCIN 0.25 SENSITIVE Sensitive     * MODERATE STREPTOCOCCUS INTERMEDIUS  Culture, blood (Routine X 2) w Reflex to ID Panel     Status: None   Collection Time: 08/11/21 10:37 AM   Specimen: BLOOD  Result Value Ref Range Status   Specimen Description   Final    BLOOD BLOOD RIGHT FOREARM Performed at [facility], [address], [place], [place] [address]    Special Requests   Final    BOTTLES DRAWN AEROBIC AND ANAEROBIC Blood Culture adequate volume Performed at [facility], [address] [place], [place] [address]    Culture   Final    NO GROWTH 5 DAYS Performed at [facility], [address] [place], [place] [address]    Report Status 08/16/2021 FINAL  Final  Culture, blood (Routine X 2) w Reflex to ID Panel     Status: None   Collection Time: 08/11/21 10:41 AM   Specimen:  BLOOD  Result Value Ref Range Status   Specimen Description   Final    BLOOD BLOOD LEFT FOREARM Performed at [facility], [address], [place], [place] [address]    Special Requests   Final  BOTTLES DRAWN AEROBIC AND ANAEROBIC Blood Culture results may not be optimal due to an excessive volume of blood received in culture bottles Performed at [facility], [address] [place], [place] [address]    Culture   Final    NO GROWTH 5 DAYS Performed at [facility], [address], [place], [place] [address]    Report Status 08/16/2021 FINAL  Final  Urine Culture     Status: None   Collection Time: 08/16/21  1:47 PM   Specimen: Urine, Random  Result Value Ref Range Status   Specimen Description   Final    URINE, RANDOM Performed at [facility], [address] [place], [place] [address]    Special Requests   Final    NONE Performed at [facility], [address], [place], [place] [address]    Culture   Final    NO GROWTH Performed at [facility], [address] [place], [place] [address]    Report Status 08/17/2021 FINAL  Final          Radiology Studies: VAS [place] LOWER EXTREMITY VENOUS (DVT)  Result Date: 08/17/2021  Lower Venous DVT Study Patient Name:  TRANDON LONGTIN  Date of Exam:   08/17/2021 Medical Rec #: [id number]       Accession #:    [id number] Date of Birth: 24-Apr-1956       Patient Gender: M Patient Age:   53 years Exam Location:  [facility] Procedure:      VAS [place] LOWER EXTREMITY VENOUS (DVT) Referring Phys: Kathlen Mody --------------------------------------------------------------------------------  Indications: Pain, and Swelling.  Comparison Study: No previous exams Performing Technologist: Jody Hill RVT, RDMS  Examination Guidelines: A complete evaluation includes B-mode imaging, spectral Doppler, color Doppler, and power Doppler as needed of all accessible  portions of each vessel. Bilateral testing is considered an integral part of a complete examination. Limited examinations for reoccurring indications may be performed as noted. The reflux portion of the exam is performed with the patient in reverse Trendelenburg.  +-----+---------------+---------+-----------+----------+--------------+ RIGHTCompressibilityPhasicitySpontaneityPropertiesThrombus Aging +-----+---------------+---------+-----------+----------+--------------+ CFV  Full           Yes      Yes                                 +-----+---------------+---------+-----------+----------+--------------+   +---------+---------------+---------+-----------+----------+--------------+ LEFT     CompressibilityPhasicitySpontaneityPropertiesThrombus Aging +---------+---------------+---------+-----------+----------+--------------+ CFV      Full           Yes      Yes                                 +---------+---------------+---------+-----------+----------+--------------+ SFJ      Full                                                        +---------+---------------+---------+-----------+----------+--------------+ FV Prox  Full           Yes      Yes                                 +---------+---------------+---------+-----------+----------+--------------+  FV Mid   Full           Yes      Yes                                 +---------+---------------+---------+-----------+----------+--------------+ FV DistalFull           Yes      Yes                                 +---------+---------------+---------+-----------+----------+--------------+ PFV      Full                                                        +---------+---------------+---------+-----------+----------+--------------+ POP      Full           Yes      Yes                                 +---------+---------------+---------+-----------+----------+--------------+ PTV      Full                                                         +---------+---------------+---------+-----------+----------+--------------+ PERO     Full                                                        +---------+---------------+---------+-----------+----------+--------------+     Summary: RIGHT: - No evidence of common femoral vein obstruction.  LEFT: - There is no evidence of deep vein thrombosis in the lower extremity. - There is no evidence of superficial venous thrombosis.  - Two large cystic structure is found in the popliteal fossa. Subcutaneous edema in area of calf.  *See table(s) above for measurements and observations. Electronically signed by Heath Lark on 08/17/2021 at 5:26:59 PM.    Final         Scheduled Meds:  amLODipine  10 mg Oral QHS   aspirin  325 mg Oral Daily   buPROPion  300 mg Oral Daily   calcium carbonate  400 mg of elemental calcium Oral BID   Chlorhexidine Gluconate Cloth  6 each Topical Daily   enoxaparin (LOVENOX) injection  60 mg Subcutaneous Q24H   insulin aspart  0-15 Units Subcutaneous TID WC   insulin aspart  0-5 Units Subcutaneous QHS   insulin aspart  8 Units Subcutaneous TID WC   insulin glargine-yfgn  15 Units Subcutaneous BID   metoprolol tartrate  100 mg Oral BID   metroNIDAZOLE  500 mg Oral Q12H   omega-3 acid ethyl esters  1 g Oral Daily   pantoprazole  40 mg Oral Daily   senna-docusate  2 tablet Oral BID   sodium chloride flush  10-40 mL Intracatheter Q12H   Vitamin D (Ergocalciferol)  50,000 Units Oral Q7 days  Continuous Infusions:  cefTRIAXone (ROCEPHIN)  IV     penicillin g continuous IV infusion 9 Million Units (08/18/21 0959)     LOS: 10 days    Time spent: 40 minutes    Ramiro Harvest, MD [facility]   To contact the attending provider between 7A-7P or the covering provider during after hours 7P-7A, please log into the web site [email or web address] and access using universal [place] password for that web site. If you do  not have the password, please call the hospital operator.  08/18/2021, 1:37 PM

## 2021-08-19 ENCOUNTER — Telehealth: Payer: Self-pay | Admitting: Podiatry

## 2021-08-19 DIAGNOSIS — R7881 Bacteremia: Secondary | ICD-10-CM | POA: Diagnosis not present

## 2021-08-19 DIAGNOSIS — L03115 Cellulitis of right lower limb: Secondary | ICD-10-CM | POA: Diagnosis not present

## 2021-08-19 DIAGNOSIS — L03116 Cellulitis of left lower limb: Secondary | ICD-10-CM | POA: Diagnosis not present

## 2021-08-19 DIAGNOSIS — N179 Acute kidney failure, unspecified: Secondary | ICD-10-CM | POA: Diagnosis not present

## 2021-08-19 LAB — CBC WITH DIFFERENTIAL/PLATELET
Abs Immature Granulocytes: 1.31 10*3/uL — ABNORMAL HIGH (ref 0.00–0.07)
Basophils Absolute: 0.1 10*3/uL (ref 0.0–0.1)
Basophils Relative: 1 %
Eosinophils Absolute: 0.3 10*3/uL (ref 0.0–0.5)
Eosinophils Relative: 1 %
HCT: 31.2 % — ABNORMAL LOW (ref 39.0–52.0)
Hemoglobin: 10.4 g/dL — ABNORMAL LOW (ref 13.0–17.0)
Immature Granulocytes: 6 %
Lymphocytes Relative: 10 %
Lymphs Abs: 2.5 10*3/uL (ref 0.7–4.0)
MCH: 28.3 pg (ref 26.0–34.0)
MCHC: 33.3 g/dL (ref 30.0–36.0)
MCV: 84.8 fL (ref 80.0–100.0)
Monocytes Absolute: 1.7 10*3/uL — ABNORMAL HIGH (ref 0.1–1.0)
Monocytes Relative: 7 %
Neutro Abs: 18.2 10*3/uL — ABNORMAL HIGH (ref 1.7–7.7)
Neutrophils Relative %: 75 %
Platelets: 546 10*3/uL — ABNORMAL HIGH (ref 150–400)
RBC: 3.68 MIL/uL — ABNORMAL LOW (ref 4.22–5.81)
RDW: 13.7 % (ref 11.5–15.5)
WBC: 24 10*3/uL — ABNORMAL HIGH (ref 4.0–10.5)
nRBC: 0 % (ref 0.0–0.2)

## 2021-08-19 LAB — SODIUM: Sodium: 125 mmol/L — ABNORMAL LOW (ref 135–145)

## 2021-08-19 LAB — GLUCOSE, CAPILLARY
Glucose-Capillary: 134 mg/dL — ABNORMAL HIGH (ref 70–99)
Glucose-Capillary: 149 mg/dL — ABNORMAL HIGH (ref 70–99)
Glucose-Capillary: 125 mg/dL — ABNORMAL HIGH (ref 70–99)
Glucose-Capillary: 125 mg/dL — ABNORMAL HIGH (ref 70–99)
Glucose-Capillary: 160 mg/dL — ABNORMAL HIGH (ref 70–99)

## 2021-08-19 LAB — RENAL FUNCTION PANEL
Albumin: 2.2 g/dL — ABNORMAL LOW (ref 3.5–5.0)
Anion gap: 9 (ref 5–15)
BUN: 46 mg/dL — ABNORMAL HIGH (ref 8–23)
CO2: 21 mmol/L — ABNORMAL LOW (ref 22–32)
Calcium: 9.3 mg/dL (ref 8.9–10.3)
Chloride: 92 mmol/L — ABNORMAL LOW (ref 98–111)
Creatinine, Ser: 1.46 mg/dL — ABNORMAL HIGH (ref 0.61–1.24)
GFR, Estimated: 54 mL/min — ABNORMAL LOW (ref >60)
Glucose, Bld: 121 mg/dL — ABNORMAL HIGH (ref 70–99)
Phosphorus: 5.1 mg/dL — ABNORMAL HIGH (ref 2.5–4.6)
Potassium: 4.4 mmol/L (ref 3.5–5.1)
Sodium: 122 mmol/L — ABNORMAL LOW (ref 135–145)

## 2021-08-19 LAB — OSMOLALITY, URINE: Osmolality, Ur: 363 mOsm/kg (ref 300–900)

## 2021-08-19 LAB — SODIUM, URINE, RANDOM: Sodium, Ur: 20 mmol/L

## 2021-08-19 LAB — OSMOLALITY: Osmolality: 273 mOsm/kg — ABNORMAL LOW (ref 275–295)

## 2021-08-19 MED ORDER — TOLVAPTAN 15 MG PO TABS
15.0000 mg | ORAL_TABLET | Freq: Once | ORAL | Status: AC
  Administered 2021-08-19: 15 mg via ORAL
  Filled 2021-08-19: qty 1

## 2021-08-19 NOTE — Progress Notes (Signed)
Patient ID: [name] [name], male   DOB: Jan 18, 1956, 65 y.o.   MRN: [id number] [facility] Progress Note   Assessment/ Plan:   1.  Hyponatremia: True hyponatremia with hypo-osmolality and inappropriately elevated urine osmolality reflective of inappropriate ADH activation.  Surprising to see that he had an additional drop of sodium while on tolvaptan-I will recheck urine osmolality, serum osmolality and send off for serum protein electrophoresis and free light chains.  Repeat tolvaptan dose today. 2.  Chronic kidney disease: With intermittent acute kidney injury that appears to be hemodynamically mediated based on episodes of relative hypotension and timing of surgery-renal function essentially unchanged overnight and possibly may be his new baseline. 3.  Left lower extremity cellulitis/abscess/osteomyelitis: Status post partial first ray amputation along with second toe amputation and drainage of abscess by podiatry.  Plans noted for outpatient follow-up with podiatry. 4.  Anemia: Likely related to chronic nonhealing foot ulcers and recent surgery, continue to monitor with overt losses from wound drain.   Subjective:   He reports to be feeling well without any emergent complaints.   Objective:   BP 130/72   Pulse 64   Temp 97.9 F (36.6 C) (Oral)   Resp 18   Ht 6' (1.829 m)   Wt 118.6 kg   SpO2 99%   BMI 35.46 kg/m   Intake/Output Summary (Last 24 hours) at 08/19/2021 1148 Last data filed at 08/19/2021 0944 Gross per 24 hour  Intake 240 ml  Output 1900 ml  Net -1660 ml   Weight change:   Physical Exam: Gen: Comfortably resting in bed, reading book CVS: Pulse regular rhythm and normal rate, S1 and S2 normal Resp: Clear to auscultation bilaterally, no rales/rhonchi Abd: Soft, obese, nontender Ext: Trace lower extremity edema bilaterally.  Right ankle/foot dressing intact-second toe with intact sutures appearing dusky in color.  Imaging: No results  found.  Labs: BMET Recent Labs  Lab 08/14/21 1303 08/15/21 0321 08/16/21 0538 08/16/21 0548 08/17/21 0409 08/17/21 1650 08/18/21 0110 08/18/21 0445 08/19/21 0600  NA 123* 122* 124* 124* 120* 124* 124* 124* 122*  K 5.3* 4.9 5.0 4.9 4.9  --   --  5.2* 4.4  CL 90* 88* 91* 91* 89*  --   --  91* 92*  CO2 25 24 24 24 23   --   --  23 21*  GLUCOSE 347* 180* 158* 160* 127*  --   --  149* 121*  BUN 39* 39* 36* 35* 38*  --   --  46* 46*  CREATININE 1.53* 1.24 0.99 0.94 1.40*  --   --  1.41* 1.46*  CALCIUM 9.6 9.0 9.8 9.6 9.4  --   --  9.3 9.3  PHOS  --   --   --  4.7*  --   --   --   --  5.1*   CBC Recent Labs  Lab 08/16/21 0538 08/17/21 0409 08/18/21 0445 08/19/21 0600  WBC 29.2* 26.5* 22.2* 24.0*  NEUTROABS 22.1* 18.9* 16.1* 18.2*  HGB 10.8* 10.5* 10.2* 10.4*  HCT 31.9* 30.7* 30.5* 31.2*  MCV 85.1 84.6 84.5 84.8  PLT 445* 465* 479* 546*    Medications:     amLODipine  10 mg Oral QHS   aspirin  325 mg Oral Daily   buPROPion  300 mg Oral Daily   calcium carbonate  400 mg of elemental calcium Oral BID   Chlorhexidine Gluconate Cloth  6 each Topical Daily   enoxaparin (LOVENOX) injection  60 mg  Subcutaneous Q24H   insulin aspart  0-15 Units Subcutaneous TID WC   insulin aspart  0-5 Units Subcutaneous QHS   insulin aspart  8 Units Subcutaneous TID WC   insulin glargine-yfgn  15 Units Subcutaneous BID   metoprolol tartrate  100 mg Oral BID   metroNIDAZOLE  500 mg Oral Q12H   omega-3 acid ethyl esters  1 g Oral Daily   pantoprazole  40 mg Oral Daily   senna-docusate  2 tablet Oral BID   sodium chloride flush  10-40 mL Intracatheter Q12H   Vitamin D (Ergocalciferol)  50,000 Units Oral Q7 days   Zetta Bills, MD 08/19/2021, 11:48 AM

## 2021-08-19 NOTE — Telephone Encounter (Signed)
I called his wife and will be meeting them there this afternoon

## 2021-08-19 NOTE — Progress Notes (Addendum)
Subjective: POD # 6 s/p return to the operating room for incision and drainage, debridement, washout of right foot infection.  He is feeling fairly well his wife is here at bedside  Objective: AAO x3, NAD-wife is at bedside DP/PT pulses palpable bilaterally, CRT less than 3 seconds Changed the dressing today.  Again found a small amount of bloody drainage from the dorsal incision.  Left leg pain improved.  No cellulitis today.  No purulence.  Left hallux ulcer healing well   WBC 24, up from 22 yesterday, also has thrombocytosis and monocytosis as well    A/P POD 3 from repeat debridement and washout  -Daily dressing changes with packing to dorsal incision.  Switch to iodoform packing.  Continues through discharge d -Foot continues to look improved day by day and quite benign.  Think his leukocytosis is secondary to inflammatory response from hospitalization, stress and multiple surgeries, especially with thrombocytosis and monocytosis as well.  Do not think this is indicative of further infection or abscess -Okay to discharge when medically stable -Has been switched to ceftriaxone for home -I placed a transition of care order, his family is concerned about his mobility with his knee pain and get around the house, he will need home PT and if other day dressing changes  Sharl Ma, DPM 08/19/2021

## 2021-08-19 NOTE — Progress Notes (Signed)
PROGRESS NOTE    [name] [name]  YWV:[id number] DOB: [birth year] DOA: 08/08/2021 PCP: Tally Joe, MD    Chief Complaint  Patient presents with   Wound Infection    Brief Narrative:  65 year old gentleman prior history of hypertension, type 2 diabetes, hyperlipidemia presents to the hospital with left lower extremity cellulitis as well as right great toe nonhealing wound with pain and tenderness.  Patient reports that over the last 1 week prior to admission patient had worsening erythema pain and foul-smelling discharge from the right toe.  He follows up with wound care.  Podiatry and ID were consulted and patient underwent partial first ray amputation of the right foot, partial second toe amputation and debridement of the deep abscess in the right foot by Dr. Loreta Ave on 08/09/2021 and repeat debridement. ID on board, recommended 4 weeks IV Penicillin and oral flagyl.  Pt would be stable for discharge when his sodium improves.    Assessment & Plan:   Active Problems:   Toe infection   Cellulitis of right lower extremity   Osteomyelitis of great toe of right foot (HCC)   Osteomyelitis of second toe of right foot (HCC)   Diabetes mellitus due to underlying condition with diabetic autonomic neuropathy, without long-term current use of insulin (HCC)   Class 3 severe obesity with serious comorbidity and body mass index (BMI) of 45.0 to 49.9 in adult [facility])   Elevated LFTs   Post-operative state   Cellulitis of left lower extremity   Streptococcal bacteremia   AKI (acute kidney injury) (HCC)  #1 right foot ulcer/wound concerning for osteomyelitis -MRI on admission of the right foot concerning for septic arthritis and osteomyelitis of the first proximal and distal phalanges as well as osteomyelitis of the second distal phalanx.  MRI also showing infectious tenosynovitis around the first metatarsal and multiple forefoot abscesses. -Patient seen in consultation by ID and podiatry. -Patient  status post partial first ray amputation, partial second toe amputation and debridement of deep abscess of the right foot per Dr. Ardelle Anton 08/09/2021. -Due to worsening leukocytosis patient underwent repeat debridement of the right foot on 08/13/2021. -MRI left foot negative for osteomyelitis. -Drawn on from right foot showed Streptococcus intermedius sensitive to penicillin. -ID following and patient initially started on IV Rocephin, subsequently transition to IV penicillin and Flagyl to complete a 4-week course of antibiotic treatment per ID recommendations. -Patient placed back on IV Rocephin and IV penicillin discontinued per ID and patient is to continue Flagyl to complete a 4-week course of antibiotic treatment -Patient seen by PT/ OT who are recommending home health therapies.  2.  Streptococcus intermedius bacteremia: -Noted on blood cultures from 08/08/2021 -Concern likely etiology diabetic foot infection. -2D echo done negative for any valvular vegetations. -Repeat blood cultures with no growth to date. -PICC line placed. -ID recommending 4 weeks of IV penicillin and Flagyl. -IV penicillin has been changed to IV Rocephin per ID.  Continue Flagyl for total of 4 weeks. -Currently afebrile.  Due to persistent leukocytosis patient underwent CT abdomen and pelvis with no abscess noted. -Patient noted to have an irregular circumferentially urinary bladder wall thickening probably chronic. -Patient denied any urinary retention, self catheterizes every once in a while and reports having a repeat urethral stent placed over 15 years ago. -Urine cytology ordered. -We will likely need outpatient follow-up with urology.  3.  Left lower extremity cellulitis -Improved. -On empiric IV antibiotics.  4.  Elevated liver enzymes -Right upper quadrant ultrasound negative for cholecystitis  however did show cholelithiasis. -Asymptomatic. -Outpatient follow-up.  5.  Hyponatremia -Sodium levels  fluctuating currently at 122 from 124 from 120 08/17/2021 after a dose of Samsca x 2. -Patient being followed by nephrology who feel patient has a true hyponatremia with hyperosmolality and inappropriate elevated urine osmolality reflective of SIADH. -Patient was on Lasix 20 mg daily with last dose 08/16/2021 which was subsequently discontinued due to worsening hyponatremia.   -Another dose of Samsca ordered per nephrology today.   -Repeat urine osmolality, serum osmolality, SPEP for free light chains has been ordered per nephrology. -Repeat labs in the a.m. -Per nephrology.  6.  Chronic kidney disease stage IIIa -Stable. -Currently at baseline. -Baseline 1.5-1.7 -Creatinine at 1.46. -Nephrology following.  7.  Type 2 diabetes mellitus with hyperglycemia with chronic kidney disease -Hemoglobin A1c 6.2 08/08/2021. -CBG 134 this morning.   -SSI.   8.  Hyperlipidemia -Lovaza.  9.  Hypertension -Continue Norvasc, metoprolol.   10.  Hyperkalemia -Potassium at 4.4 today.   -Avapro discontinued.   -Continue renal diet.     DVT prophylaxis: Lovenox Code Status: Full Family Communication: Updated patient.  No family at bedside. Disposition:   Status is: Inpatient  Remains inpatient appropriate because:Inpatient level of care appropriate due to severity of illness  Dispo: The patient is from: Home              Anticipated d/c is to: Home once hyponatremia corrected and resolved              Patient currently is not medically stable to d/c.   Difficult to place patient No       Consultants:  Nephrology: Dr. Allena Katz 08/15/2021 ID: Dr. Daiva Eves 08/08/2021 Podiatry: Dr. Ardelle Anton 08/08/2021    Procedures:  CT abdomen and pelvis 08/15/2021 Plain films of the right foot 08/09/2021 Plain films of the left foot 08/08/2021 MRI right foot 08/08/2021 MRI left foot 08/09/2021 Renal ultrasound 08/14/2021 Abdominal ultrasound 08/08/2021 2D echo 08/10/2021 Lower extremity Dopplers  08/17/2021 ABI 08/09/2021 Incision and drainage right foot wound per podiatry: Dr. Loreta Ave 08/13/2021 Partial first ray amputation right foot, partial second toe amputation, I&D deep abscess right foot per podiatry, Dr. Loreta Ave 08/09/2021 Right upper extremity PICC line placement   Antimicrobials:  IV Ancef 08/09/2021>>>> 08/10/2021 IV Rocephin 08/10/2021>>>>> 08/12/2021 IV penicillin 08/13/2021>>>>>> 08/18/2021 Oral Flagyl 08/12/2021>>>>> 09/09/2021 IV Rocephin 08/18/2021>>>> 09/09/2021    Subjective: Laying in bed.  No complaints.  No chest pain.  No shortness of breath.  No abdominal pain.  No lightheadedness or dizziness.   Objective: Vitals:   08/18/21 1341 08/18/21 2002 08/19/21 0446 08/19/21 1239  BP: 112/73 128/71 130/72 122/74  Pulse: 64 79 64 64  Resp: Temp: 98.1 F (36.7 C) 98.2 F (36.8 C) 97.9 F (36.6 C) 98.3 F (36.8 C)  TempSrc: Oral Oral Oral Oral  SpO2: 100% 100% 99% 99%  Weight:      Height:        Intake/Output Summary (Last 24 hours) at 08/19/2021 1325 Last data filed at 08/19/2021 1300 Gross per 24 hour  Intake 480 ml  Output 2400 ml  Net -1920 ml    Filed Weights   08/08/21 1149 08/08/21 1738 08/09/21 1625  Weight: 112.9 kg 118.6 kg 118.6 kg    Examination:  General exam: NAD Respiratory system: Lungs clear to auscultation bilaterally anterior lung fields.  No wheezes, no crackles, no rhonchi.  Normal respiratory effort.   Cardiovascular system: Regular rate rhythm no murmurs  rubs or gallops.  No JVD.  No lower extremity edema.  Gastrointestinal system: Abdomen is soft, nontender, nondistended, positive bowel sounds.  No rebound.  No guarding.  Central nervous system: Alert and oriented. No focal neurological deficits. Extremities: Right foot wrapped.  No lower extremity edema.  Skin: No rashes, lesions or ulcers Psychiatry: Judgement and insight appear normal. Mood & affect appropriate.     Data Reviewed: I have personally reviewed  following labs and imaging studies  CBC: Recent Labs  Lab 08/14/21 1303 08/15/21 0321 08/16/21 0538 08/17/21 0409 08/18/21 0445 08/19/21 0600  WBC 28.7* 29.8* 29.2* 26.5* 22.2* 24.0*  NEUTROABS 22.7*  --  22.1* 18.9* 16.1* 18.2*  HGB 11.1* 10.7* 10.8* 10.5* 10.2* 10.4*  HCT 32.5* 31.5* 31.9* 30.7* 30.5* 31.2*  MCV 85.3 85.8 85.1 84.6 84.5 84.8  PLT 336 344 445* 465* 479* 546*     Basic Metabolic Panel: Recent Labs  Lab 08/16/21 0538 08/16/21 0548 08/17/21 0409 08/17/21 1650 08/18/21 0110 08/18/21 0445 08/19/21 0600  NA 124* 124* 120* 124* 124* 124* 122*  K 5.0 4.9 4.9  --   --  5.2* 4.4  CL 91* 91* 89*  --   --  91* 92*  CO2 24 24 23   --   --  23 21*  GLUCOSE 158* 160* 127*  --   --  149* 121*  BUN 36* 35* 38*  --   --  46* 46*  CREATININE 0.99 0.94 1.40*  --   --  1.41* 1.46*  CALCIUM 9.8 9.6 9.4  --   --  9.3 9.3  PHOS  --  4.7*  --   --   --   --  5.1*     GFR: Estimated Creatinine Clearance: 68.9 mL/min (A) (by C-G formula based on SCr of 1.46 mg/dL (H)).  Liver Function Tests: Recent Labs  Lab 08/14/21 1303 08/16/21 0548 08/17/21 1100 08/19/21 0600  AST 34  --  27  --   ALT 45*  --  29  --   ALKPHOS 178*  --  158*  --   BILITOT 1.2  --  0.8  --   PROT 7.0  --  6.8  --   ALBUMIN 2.2* 2.1* 2.1* 2.2*     CBG: Recent Labs  Lab 08/18/21 1142 08/18/21 1659 08/18/21 2003 08/19/21 0740 08/19/21 1146  GLUCAP 151* 154* 167* 134* 149*      Recent Results (from the past 240 hour(s))  Aerobic/Anaerobic Culture w Gram Stain (surgical/deep wound)     Status: None   Collection Time: 08/09/21  6:18 PM   Specimen: Abscess  Result Value Ref Range Status   Specimen Description   Final    ABSCESS RIGHT BIG TOE Performed at [facility], [address], [place], [place]    Special Requests   Final    NONE Performed at [facility], [address] [place], [place]    Gram Stain   Final     RARE WBC PRESENT, PREDOMINANTLY MONONUCLEAR ABUNDANT GRAM POSITIVE COCCI IN PAIRS ABUNDANT GRAM POSITIVE COCCI IN CLUSTERS    Culture   Final    FEW STREPTOCOCCUS AGALACTIAE TESTING AGAINST S. AGALACTIAE NOT ROUTINELY PERFORMED DUE TO PREDICTABILITY OF AMP/PEN/VAN SUSCEPTIBILITY. MODERATE STREPTOCOCCUS INTERMEDIUS FEW PREVOTELLA BIVIA BETA LACTAMASE POSITIVE Performed at [facility], [address], [place], [place]    Report Status 08/15/2021 FINAL  Final   Organism ID, Bacteria STREPTOCOCCUS  INTERMEDIUS  Final      Susceptibility   Streptococcus intermedius - MIC*    PENICILLIN <=0.06 SENSITIVE Sensitive     CEFTRIAXONE <=0.12 SENSITIVE Sensitive     ERYTHROMYCIN >=8 RESISTANT Resistant     LEVOFLOXACIN <=0.25 SENSITIVE Sensitive     VANCOMYCIN 0.25 SENSITIVE Sensitive     * MODERATE STREPTOCOCCUS INTERMEDIUS  Culture, blood (Routine X 2) w Reflex to ID Panel     Status: None   Collection Time: 08/11/21 10:37 AM   Specimen: BLOOD  Result Value Ref Range Status   Specimen Description   Final    BLOOD BLOOD RIGHT FOREARM Performed at [facility], [address] [place], [place] [address]    Special Requests   Final    BOTTLES DRAWN AEROBIC AND ANAEROBIC Blood Culture adequate volume Performed at [facility], [address], [place], [place] [address]    Culture   Final    NO GROWTH 5 DAYS Performed at [facility], [address] [place], [place] [address]    Report Status 08/16/2021 FINAL  Final  Culture, blood (Routine X 2) w Reflex to ID Panel     Status: None   Collection Time: 08/11/21 10:41 AM   Specimen: BLOOD  Result Value Ref Range Status   Specimen Description   Final    BLOOD BLOOD LEFT FOREARM Performed at [facility], [address] [place], [place] [address]    Special Requests   Final    BOTTLES DRAWN AEROBIC AND ANAEROBIC Blood Culture results may not be optimal due to  an excessive volume of blood received in culture bottles Performed at [facility], [address] [place], [place] [address]    Culture   Final    NO GROWTH 5 DAYS Performed at [facility], [address], [place], [place] [address]    Report Status 08/16/2021 FINAL  Final  Urine Culture     Status: None   Collection Time: 08/16/21  1:47 PM   Specimen: Urine, Random  Result Value Ref Range Status   Specimen Description   Final    URINE, RANDOM Performed at [facility], [address] [place], [place] [address]    Special Requests   Final    NONE Performed at [facility], [address] [place], [place] [address]    Culture   Final    NO GROWTH Performed at [facility], [address], [place], [place] [address]    Report Status 08/17/2021 FINAL  Final          Radiology Studies: No results found.      Scheduled Meds:  amLODipine  10 mg Oral QHS   aspirin  325 mg Oral Daily   buPROPion  300 mg Oral Daily   calcium carbonate  400 mg of elemental calcium Oral BID   Chlorhexidine Gluconate Cloth  6 each Topical Daily   enoxaparin (LOVENOX) injection  60 mg Subcutaneous Q24H   insulin aspart  0-15 Units Subcutaneous TID WC   insulin aspart  0-5 Units Subcutaneous QHS   insulin aspart  8 Units Subcutaneous TID WC   insulin glargine-yfgn  15 Units Subcutaneous BID   metoprolol tartrate  100 mg Oral BID   metroNIDAZOLE  500 mg Oral Q12H   omega-3 acid ethyl esters  1 g Oral Daily   pantoprazole  40 mg Oral Daily  senna-docusate  2 tablet Oral BID   sodium chloride flush  10-40 mL Intracatheter Q12H   Vitamin D (Ergocalciferol)  50,000 Units Oral Q7 days   Continuous Infusions:  cefTRIAXone (ROCEPHIN)  IV 2 g (08/18/21 2251)     LOS: 11 days    Time spent: 35 minutes    Ramiro Harvest, MD [facility]   To contact the attending provider between 7A-7P or the covering  provider during after hours 7P-7A, please log into the web site [email or web address] and access using universal [place] password for that web site. If you do not have the password, please call the hospital operator.  08/19/2021, 1:25 PM

## 2021-08-19 NOTE — Telephone Encounter (Signed)
Patients wife called stating one of the MD's are suppose to come to the hospital to see her husband. Patients wife would like someone call her because she wants to be there. Wife's phone number [phone][

## 2021-08-20 DIAGNOSIS — N179 Acute kidney failure, unspecified: Secondary | ICD-10-CM | POA: Diagnosis not present

## 2021-08-20 DIAGNOSIS — L03116 Cellulitis of left lower limb: Secondary | ICD-10-CM | POA: Diagnosis not present

## 2021-08-20 DIAGNOSIS — L03115 Cellulitis of right lower limb: Secondary | ICD-10-CM | POA: Diagnosis not present

## 2021-08-20 DIAGNOSIS — R7881 Bacteremia: Secondary | ICD-10-CM | POA: Diagnosis not present

## 2021-08-20 LAB — CBC WITH DIFFERENTIAL/PLATELET
Abs Immature Granulocytes: 1.09 10*3/uL — ABNORMAL HIGH (ref 0.00–0.07)
Basophils Absolute: 0.1 10*3/uL (ref 0.0–0.1)
Basophils Relative: 1 %
Eosinophils Absolute: 0.2 10*3/uL (ref 0.0–0.5)
Eosinophils Relative: 1 %
HCT: 30.3 % — ABNORMAL LOW (ref 39.0–52.0)
Hemoglobin: 10.2 g/dL — ABNORMAL LOW (ref 13.0–17.0)
Immature Granulocytes: 6 %
Lymphocytes Relative: 12 %
Lymphs Abs: 2.3 10*3/uL (ref 0.7–4.0)
MCH: 28.7 pg (ref 26.0–34.0)
MCHC: 33.7 g/dL (ref 30.0–36.0)
MCV: 85.4 fL (ref 80.0–100.0)
Monocytes Absolute: 1.4 10*3/uL — ABNORMAL HIGH (ref 0.1–1.0)
Monocytes Relative: 7 %
Neutro Abs: 14.7 10*3/uL — ABNORMAL HIGH (ref 1.7–7.7)
Neutrophils Relative %: 73 %
Platelets: 560 10*3/uL — ABNORMAL HIGH (ref 150–400)
RBC: 3.55 MIL/uL — ABNORMAL LOW (ref 4.22–5.81)
RDW: 13.7 % (ref 11.5–15.5)
WBC: 19.8 10*3/uL — ABNORMAL HIGH (ref 4.0–10.5)
nRBC: 0 % (ref 0.0–0.2)

## 2021-08-20 LAB — GLUCOSE, CAPILLARY
Glucose-Capillary: 121 mg/dL — ABNORMAL HIGH (ref 70–99)
Glucose-Capillary: 251 mg/dL — ABNORMAL HIGH (ref 70–99)
Glucose-Capillary: 169 mg/dL — ABNORMAL HIGH (ref 70–99)
Glucose-Capillary: 115 mg/dL — ABNORMAL HIGH (ref 70–99)

## 2021-08-20 LAB — COMPREHENSIVE METABOLIC PANEL
ALT: 27 U/L (ref 0–44)
AST: 24 U/L (ref 15–41)
Albumin: 2.3 g/dL — ABNORMAL LOW (ref 3.5–5.0)
Alkaline Phosphatase: 164 U/L — ABNORMAL HIGH (ref 38–126)
Anion gap: 9 (ref 5–15)
BUN: 39 mg/dL — ABNORMAL HIGH (ref 8–23)
CO2: 20 mmol/L — ABNORMAL LOW (ref 22–32)
Calcium: 9.4 mg/dL (ref 8.9–10.3)
Chloride: 95 mmol/L — ABNORMAL LOW (ref 98–111)
Creatinine, Ser: 1.53 mg/dL — ABNORMAL HIGH (ref 0.61–1.24)
GFR, Estimated: 51 mL/min — ABNORMAL LOW (ref >60)
Glucose, Bld: 113 mg/dL — ABNORMAL HIGH (ref 70–99)
Potassium: 4.1 mmol/L (ref 3.5–5.1)
Sodium: 124 mmol/L — ABNORMAL LOW (ref 135–145)
Total Bilirubin: 0.7 mg/dL (ref 0.3–1.2)
Total Protein: 7.5 g/dL (ref 6.5–8.1)

## 2021-08-20 MED ORDER — TOLVAPTAN 15 MG PO TABS
15.0000 mg | ORAL_TABLET | Freq: Once | ORAL | Status: AC
  Administered 2021-08-20: 15 mg via ORAL
  Filled 2021-08-20: qty 1

## 2021-08-20 MED ORDER — STERILE WATER FOR INJECTION IJ SOLN
INTRAMUSCULAR | Status: AC
  Filled 2021-08-20: qty 10

## 2021-08-20 NOTE — Progress Notes (Signed)
Physical Therapy Treatment Patient Details Name: [name] [name] MRN: [id number] DOB: April 30, 1956 Today's Date: 08/20/2021   History of Present Illness Pt s/p R great toe amputation 08/09/21 and R foot I&D 08/13/21.  Pt with hx of CAD, CABG, DM and "bad L knee"  - pt states he can not rely on L knee to support him.    PT Comments    Patient progressing well with WC mobility squat pivot transfers. He required min guard and cues for sequencing 2 small squat pivot/scoot transfers to move bed>WC and assist for armrest positioning. Pt able to self propel with bil UE use and navigate hallway, doorways, and turns without assist this session. EOS completed additional squat pivot to move to recliner with assist for set up. Pt maintained NWB on Rt LE throughout. Acute PT will continue to progress pt as able.    Recommendations for follow up therapy are one component of a multi-disciplinary discharge planning process, led by the attending physician.  Recommendations may be updated based on patient status, additional functional criteria and insurance authorization.  Follow Up Recommendations  Home health PT     Equipment Recommendations  Rolling walker with 5" wheels;Wheelchair (measurements PT);Wheelchair cushion (measurements PT) (elevated leg rests)    Recommendations for Other Services OT consult     Precautions / Restrictions Precautions Precautions: Fall Required Braces or Orthoses: Other Brace Other Brace: Darko shoe for R foot Restrictions Weight Bearing Restrictions: Yes RLE Weight Bearing: Non weight bearing     Mobility  Bed Mobility Overal bed mobility: Needs Assistance Bed Mobility: Supine to Sit;Sit to Supine     Supine to sit: HOB elevated;Supervision Sit to supine: HOB elevated;Supervision   General bed mobility comments: pt using bed rail and UEs to to assist with bringing trunk to upright and progress LEs to EOB. extra time needed.    Transfers Overall transfer  level: Needs assistance Equipment used: None Transfers: Squat Pivot Transfers     Squat pivot transfers: Min guard     General transfer comment: cues and assist for WC set up to position for safe squat pivot transfer. min guard for safety with pt using bil UE and Lt LE to raise buttocks off EOB into mini-squat and scoot/pivot halfway to [facility]. pt completed 2nd pivot and ended in char. Assist provided for [facility] management or removable armrest and leg rests. pt transfered with 2 small pivots from WC>recliner at EOS.  Ambulation/Gait                 Psychologist, counselling mobility: Yes Wheelchair propulsion: Both upper extremities Wheelchair parts: Psychologist, sport and exercise Details (indicate cue type and reason): assist for adjustment of elevating leg rests, removing arm rests, for performance of transfers. pt demonstrated good carryover for safe wc propulsion with bil UE's (obstacle negotiation, turns, and backward navigation. Pt required min assist to position WC close to recliner for trasnfer at EOS.  Modified Rankin (Stroke Patients Only)       Balance Overall balance assessment: Needs assistance Sitting-balance support: Bilateral upper extremity supported Sitting balance-Leahy Scale: Good                                      Cognition Arousal/Alertness: Awake/alert Behavior During Therapy: WFL for tasks assessed/performed Overall Cognitive Status: Within Functional Limits for tasks assessed  Exercises General Exercises - Lower Extremity Quad Sets: AROM;Both;10 reps;Supine Long Arc Quad: AAROM;Both;10 reps;Seated Hip Flexion/Marching: AAROM;Both;10 reps;Seated    General Comments        Pertinent Vitals/Pain Pain Assessment: Faces Faces Pain Scale: Hurts a little bit Pain Location: R foot Pain Descriptors / Indicators:  Aching Pain Intervention(s): Monitored during session;Limited activity within patient's tolerance;Repositioned    Home Living                      Prior Function            PT Goals (current goals can now be found in the care plan section) Acute Rehab PT Goals Patient Stated Goal: HOME with spouse PT Goal Formulation: With patient Time For Goal Achievement: 08/28/21 Potential to Achieve Goals: Good Progress towards PT goals: Progressing toward goals    Frequency    Min 3X/week      PT Plan Current plan remains appropriate    Co-evaluation              AM-PAC PT "6 Clicks" Mobility   Outcome Measure  Help needed turning from your back to your side while in a flat bed without using bedrails?: A Little Help needed moving from lying on your back to sitting on the side of a flat bed without using bedrails?: A Little Help needed moving to and from a bed to a chair (including a wheelchair)?: A Lot Help needed standing up from a chair using your arms (e.g., wheelchair or bedside chair)?: Total Help needed to walk in hospital room?: Total Help needed climbing 3-5 steps with a railing? : Total 6 Click Score: 11    End of Session Equipment Utilized During Treatment: Gait belt Activity Tolerance: Patient tolerated treatment well Patient left: in bed;with bed alarm set;with call bell/phone within reach;with family/visitor present Nurse Communication: Mobility status PT Visit Diagnosis: Muscle weakness (generalized) (M62.81);Difficulty in walking, not elsewhere classified (R26.2);Pain Pain - Right/Left: Right Pain - part of body: Ankle and joints of foot     Time: 1224-8250 PT Time Calculation (min) (ACUTE ONLY): 23 min  Charges:  $Therapeutic Activity: 8-22 mins $Wheel Chair Management: 8-22 mins                     Wynn Maudlin, DPT Acute Rehabilitation Services Office [phone] Pager [phone]    Anitra Lauth 08/20/2021, 1:33 PM

## 2021-08-20 NOTE — Plan of Care (Signed)

## 2021-08-20 NOTE — Progress Notes (Signed)
PROGRESS NOTE    [name] [name]  TIW:[id number] DOB: [birth year] DOA: 08/08/2021 PCP: Tally Joe, MD    Chief Complaint  Patient presents with   Wound Infection    Brief Narrative:  65 year old gentleman prior history of hypertension, type 2 diabetes, hyperlipidemia presents to the hospital with left lower extremity cellulitis as well as right great toe nonhealing wound with pain and tenderness.  Patient reports that over the last 1 week prior to admission patient had worsening erythema pain and foul-smelling discharge from the right toe.  He follows up with wound care.  Podiatry and ID were consulted and patient underwent partial first ray amputation of the right foot, partial second toe amputation and debridement of the deep abscess in the right foot by Dr. Loreta Ave on 08/09/2021 and repeat debridement. ID on board, recommended 4 weeks IV Penicillin and oral flagyl.  Pt would be stable for discharge when his sodium improves.    Assessment & Plan:   Active Problems:   Toe infection   Cellulitis of right lower extremity   Osteomyelitis of great toe of right foot (HCC)   Osteomyelitis of second toe of right foot (HCC)   Diabetes mellitus due to underlying condition with diabetic autonomic neuropathy, without long-term current use of insulin (HCC)   Class 3 severe obesity with serious comorbidity and body mass index (BMI) of 45.0 to 49.9 in adult [facility])   Elevated LFTs   Post-operative state   Cellulitis of left lower extremity   Streptococcal bacteremia   AKI (acute kidney injury) (HCC)  #1 right foot ulcer/wound concerning for osteomyelitis -MRI on admission of the right foot concerning for septic arthritis and osteomyelitis of the first proximal and distal phalanges as well as osteomyelitis of the second distal phalanx.  MRI also showing infectious tenosynovitis around the first metatarsal and multiple forefoot abscesses. -Patient seen in consultation by ID and podiatry. -Patient  status post partial first ray amputation, partial second toe amputation and debridement of deep abscess of the right foot per Dr. Ardelle Anton 08/09/2021. -Due to worsening leukocytosis patient underwent repeat debridement of the right foot on 08/13/2021. -MRI left foot negative for osteomyelitis. -Drawn on from right foot showed Streptococcus intermedius sensitive to penicillin. -ID following and patient initially started on IV Rocephin, subsequently transition to IV penicillin and Flagyl to complete a 4-week course of antibiotic treatment per ID recommendations. -Patient placed back on IV Rocephin and IV penicillin discontinued per ID and patient is to continue Flagyl to complete a 4-week course of antibiotic treatment -Patient seen by PT/ OT who are recommending home health therapies.  2.  Streptococcus intermedius bacteremia: -Noted on blood cultures from 08/08/2021 -Concern likely etiology diabetic foot infection. -2D echo done negative for any valvular vegetations. -Repeat blood cultures with no growth to date. -PICC line placed. -ID recommending initially 4 weeks of IV penicillin and Flagyl. -IV penicillin has been changed to IV Rocephin per ID.  Continue Flagyl for total of 4 weeks. -Currently afebrile.  Due to persistent leukocytosis patient underwent CT abdomen and pelvis with no abscess noted. -Patient noted to have an irregular circumferentially urinary bladder wall thickening probably chronic. -Patient denied any urinary retention, self catheterizes every once in a while and reports having a repeat urethral stent placed over 15 years ago. -Urine cytology ordered. -We will likely need outpatient follow-up with urology.  3.  Left lower extremity cellulitis -Improved. -IV antibiotics.  4.  Elevated liver enzymes -Right upper quadrant ultrasound negative for cholecystitis however  did show cholelithiasis. -Asymptomatic. -Outpatient follow-up.  5.  Hyponatremia -Sodium levels  fluctuating currently at 124 from 125 from 122 from 124 from 120 08/17/2021 after Samsca x 3. -Patient being followed by nephrology who feel patient has a true hyponatremia with hyperosmolality and inappropriate elevated urine osmolality reflective of SIADH. -Patient was on Lasix 20 mg daily with last dose 08/16/2021 which was subsequently discontinued due to worsening hyponatremia.   -Another dose of Samsca ordered per nephrology today.   -Repeat urine osmolality at 363, urine sodium at 20,, serum osmolality at 273, SPEP for free light chains has been ordered per nephrology. -Repeat labs in the a.m. -Per nephrology.  6.  Chronic kidney disease stage IIIa -Stable. -Currently at baseline. -Baseline 1.5-1.7 -Creatinine at 1.53 today. -Nephrology following.  7.  Type 2 diabetes mellitus with hyperglycemia with chronic kidney disease -Hemoglobin A1c 6.2 08/08/2021. -CBG 121 this morning.   -SSI.   8.  Hyperlipidemia -Lovaza.  9.  Hypertension -Continue metoprolol, Norvasc.   10.  Hyperkalemia -Potassium at 4.1. -Avapro discontinued.   -Continue renal diet.     DVT prophylaxis: Lovenox Code Status: Full Family Communication: Updated patient and wife at bedside. Disposition:   Status is: Inpatient  Remains inpatient appropriate because:Inpatient level of care appropriate due to severity of illness  Dispo: The patient is from: Home              Anticipated d/c is to: Home once hyponatremia corrected and resolved              Patient currently is not medically stable to d/c.   Difficult to place patient No       Consultants:  Nephrology: Dr. Allena Katz 08/15/2021 ID: Dr. Daiva Eves 08/08/2021 Podiatry: Dr. Ardelle Anton 08/08/2021    Procedures:  CT abdomen and pelvis 08/15/2021 Plain films of the right foot 08/09/2021 Plain films of the left foot 08/08/2021 MRI right foot 08/08/2021 MRI left foot 08/09/2021 Renal ultrasound 08/14/2021 Abdominal ultrasound 08/08/2021 2D echo  08/10/2021 Lower extremity Dopplers 08/17/2021 ABI 08/09/2021 Incision and drainage right foot wound per podiatry: Dr. Loreta Ave 08/13/2021 Partial first ray amputation right foot, partial second toe amputation, I&D deep abscess right foot per podiatry, Dr. Loreta Ave 08/09/2021 Right upper extremity PICC line placement   Antimicrobials:  IV Ancef 08/09/2021>>>> 08/10/2021 IV Rocephin 08/10/2021>>>>> 08/12/2021 IV penicillin 08/13/2021>>>>>> 08/18/2021 Oral Flagyl 08/12/2021>>>>> 09/09/2021 IV Rocephin 08/18/2021>>>> 09/09/2021    Subjective: Sitting up in chair.  Family at bedside.  Denies any chest pain.  No shortness of breath.  No abdominal pain.    Objective: Vitals:   08/19/21 0446 08/19/21 1239 08/19/21 2108 08/20/21 0624  BP: 130/72 122/74 130/77 123/79  Pulse: 64 64 72 70  Resp: 18 20 18 18   Temp: 97.9 F (36.6 C) 98.3 F (36.8 C) 98.1 F (36.7 C) 97.7 F (36.5 C)  TempSrc: Oral Oral Oral Oral  SpO2: 99% 99% 98% 97%  Weight:      Height:        Intake/Output Summary (Last 24 hours) at 08/20/2021 1304 Last data filed at 08/20/2021 0500 Gross per 24 hour  Intake 110 ml  Output 850 ml  Net -740 ml    Filed Weights   08/08/21 1149 08/08/21 1738 08/09/21 1625  Weight: 112.9 kg 118.6 kg 118.6 kg    Examination:  General exam: : NAD Respiratory system: CTA B.  No wheezes, no rhonchi.  Speaking in full sentences.  Normal respiratory effort. Cardiovascular system: Regular rate and rhythm no  murmurs rubs or gallops.  No JVD.  No lower extremity edema.  Gastrointestinal system: Abdomen soft, nontender, nondistended, positive bowel sounds.  No rebound.  No guarding. Central nervous system: Alert and oriented. No focal neurological deficits. Extremities: Right foot wrapped in bandage.  Skin: No rashes, lesions or ulcers Psychiatry: Judgement and insight appear normal. Mood & affect appropriate.   Data Reviewed: I have personally reviewed following labs and imaging  studies  CBC: Recent Labs  Lab 08/16/21 0538 08/17/21 0409 08/18/21 0445 08/19/21 0600 08/20/21 0448  WBC 29.2* 26.5* 22.2* 24.0* 19.8*  NEUTROABS 22.1* 18.9* 16.1* 18.2* 14.7*  HGB 10.8* 10.5* 10.2* 10.4* 10.2*  HCT 31.9* 30.7* 30.5* 31.2* 30.3*  MCV 85.1 84.6 84.5 84.8 85.4  PLT 445* 465* 479* 546* 560*     Basic Metabolic Panel: Recent Labs  Lab 08/16/21 0548 08/17/21 0409 08/17/21 1650 08/18/21 0110 08/18/21 0445 08/19/21 0600 08/19/21 2135 08/20/21 0448  NA 124* 120*   < > 124* 124* 122* 125* 124*  K 4.9 4.9  --   --  5.2* 4.4  --  4.1  CL 91* 89*  --   --  91* 92*  --  95*  CO2 24 23  --   --  23 21*  --  20*  GLUCOSE 160* 127*  --   --  149* 121*  --  113*  BUN 35* 38*  --   --  46* 46*  --  39*  CREATININE 0.94 1.40*  --   --  1.41* 1.46*  --  1.53*  CALCIUM 9.6 9.4  --   --  9.3 9.3  --  9.4  PHOS 4.7*  --   --   --   --  5.1*  --   --    < > = values in this interval not displayed.     GFR: Estimated Creatinine Clearance: 65.7 mL/min (A) (by C-G formula based on SCr of 1.53 mg/dL (H)).  Liver Function Tests: Recent Labs  Lab 08/14/21 1303 08/16/21 0548 08/17/21 1100 08/19/21 0600 08/20/21 0448  AST 34  --  27  --  24  ALT 45*  --  29  --  27  ALKPHOS 178*  --  158*  --  164*  BILITOT 1.2  --  0.8  --  0.7  PROT 7.0  --  6.8  --  7.5  ALBUMIN 2.2* 2.1* 2.1* 2.2* 2.3*     CBG: Recent Labs  Lab 08/19/21 1633 08/19/21 2032 08/19/21 2110 08/20/21 0730 08/20/21 1159  GLUCAP 125* 160* 125* 121* 251*      Recent Results (from the past 240 hour(s))  Culture, blood (Routine X 2) w Reflex to ID Panel     Status: None   Collection Time: 08/11/21 10:37 AM   Specimen: BLOOD  Result Value Ref Range Status   Specimen Description   Final    BLOOD BLOOD RIGHT FOREARM Performed at [facility], [address] [place], [place] [address]    Special Requests   Final    BOTTLES DRAWN AEROBIC AND ANAEROBIC Blood Culture  adequate volume Performed at [facility], [address] [place], [place] [address]    Culture   Final    NO GROWTH 5 DAYS Performed at [facility], [address], [place], [place] [address]    Report Status 08/16/2021 FINAL  Final  Culture, blood (Routine X 2) w Reflex to ID Panel  Status: None   Collection Time: 08/11/21 10:41 AM   Specimen: BLOOD  Result Value Ref Range Status   Specimen Description   Final    BLOOD BLOOD LEFT FOREARM Performed at [facility], [address] [place], [place] [address]    Special Requests   Final    BOTTLES DRAWN AEROBIC AND ANAEROBIC Blood Culture results may not be optimal due to an excessive volume of blood received in culture bottles Performed at [facility], [address], [place], [place] [address]    Culture   Final    NO GROWTH 5 DAYS Performed at [facility], [address], [place], [place] [address]    Report Status 08/16/2021 FINAL  Final  Urine Culture     Status: None   Collection Time: 08/16/21  1:47 PM   Specimen: Urine, Random  Result Value Ref Range Status   Specimen Description   Final    URINE, RANDOM Performed at [facility], [address], [place], [place] [address]    Special Requests   Final    NONE Performed at [facility], [address], [place], [place] [address]    Culture   Final    NO GROWTH Performed at [facility], [address] [place], [place] [address]    Report Status 08/17/2021 FINAL  Final          Radiology Studies: No results found.      Scheduled Meds:  amLODipine  10 mg Oral QHS   aspirin  325 mg Oral Daily   buPROPion  300 mg Oral Daily   calcium carbonate  400 mg of elemental calcium Oral BID   Chlorhexidine Gluconate Cloth  6 each Topical Daily   enoxaparin (LOVENOX) injection  60 mg Subcutaneous Q24H   insulin aspart  0-15 Units Subcutaneous  TID WC   insulin aspart  0-5 Units Subcutaneous QHS   insulin aspart  8 Units Subcutaneous TID WC   insulin glargine-yfgn  15 Units Subcutaneous BID   metoprolol tartrate  100 mg Oral BID   metroNIDAZOLE  500 mg Oral Q12H   omega-3 acid ethyl esters  1 g Oral Daily   pantoprazole  40 mg Oral Daily   senna-docusate  2 tablet Oral BID   sodium chloride flush  10-40 mL Intracatheter Q12H   Vitamin D (Ergocalciferol)  50,000 Units Oral Q7 days   Continuous Infusions:  cefTRIAXone (ROCEPHIN)  IV 2 g (08/19/21 2144)     LOS: 12 days    Time spent: 35 minutes    Ramiro Harvest, MD [facility]   To contact the attending provider between 7A-7P or the covering provider during after hours 7P-7A, please log into the web site [email or web address] and access using universal [place] password for that web site. If you do not have the password, please call the hospital operator.  08/20/2021, 1:04 PM

## 2021-08-21 DIAGNOSIS — E871 Hypo-osmolality and hyponatremia: Secondary | ICD-10-CM

## 2021-08-21 DIAGNOSIS — L03115 Cellulitis of right lower limb: Secondary | ICD-10-CM | POA: Diagnosis not present

## 2021-08-21 DIAGNOSIS — E0843 Diabetes mellitus due to underlying condition with diabetic autonomic (poly)neuropathy: Secondary | ICD-10-CM | POA: Diagnosis not present

## 2021-08-21 DIAGNOSIS — R7881 Bacteremia: Secondary | ICD-10-CM | POA: Diagnosis not present

## 2021-08-21 DIAGNOSIS — N179 Acute kidney failure, unspecified: Secondary | ICD-10-CM | POA: Diagnosis not present

## 2021-08-21 LAB — GLUCOSE, CAPILLARY: Glucose-Capillary: 144 mg/dL — ABNORMAL HIGH (ref 70–99)

## 2021-08-21 LAB — RENAL FUNCTION PANEL
Albumin: 2.4 g/dL — ABNORMAL LOW (ref 3.5–5.0)
Anion gap: 10 (ref 5–15)
BUN: 35 mg/dL — ABNORMAL HIGH (ref 8–23)
CO2: 19 mmol/L — ABNORMAL LOW (ref 22–32)
Calcium: 9.8 mg/dL (ref 8.9–10.3)
Chloride: 98 mmol/L (ref 98–111)
Creatinine, Ser: 1.41 mg/dL — ABNORMAL HIGH (ref 0.61–1.24)
GFR, Estimated: 56 mL/min — ABNORMAL LOW (ref >60)
Glucose, Bld: 153 mg/dL — ABNORMAL HIGH (ref 70–99)
Phosphorus: 4.4 mg/dL (ref 2.5–4.6)
Potassium: 3.8 mmol/L (ref 3.5–5.1)
Sodium: 127 mmol/L — ABNORMAL LOW (ref 135–145)

## 2021-08-21 LAB — CBC WITH DIFFERENTIAL/PLATELET
Abs Immature Granulocytes: 0.75 10*3/uL — ABNORMAL HIGH (ref 0.00–0.07)
Basophils Absolute: 0.1 10*3/uL (ref 0.0–0.1)
Basophils Relative: 1 %
Eosinophils Absolute: 0.2 10*3/uL (ref 0.0–0.5)
Eosinophils Relative: 1 %
HCT: 29.7 % — ABNORMAL LOW (ref 39.0–52.0)
Hemoglobin: 9.9 g/dL — ABNORMAL LOW (ref 13.0–17.0)
Immature Granulocytes: 5 %
Lymphocytes Relative: 14 %
Lymphs Abs: 2.3 10*3/uL (ref 0.7–4.0)
MCH: 28.4 pg (ref 26.0–34.0)
MCHC: 33.3 g/dL (ref 30.0–36.0)
MCV: 85.1 fL (ref 80.0–100.0)
Monocytes Absolute: 1.2 10*3/uL — ABNORMAL HIGH (ref 0.1–1.0)
Monocytes Relative: 7 %
Neutro Abs: 12.2 10*3/uL — ABNORMAL HIGH (ref 1.7–7.7)
Neutrophils Relative %: 72 %
Platelets: 504 10*3/uL — ABNORMAL HIGH (ref 150–400)
RBC: 3.49 MIL/uL — ABNORMAL LOW (ref 4.22–5.81)
RDW: 13.8 % (ref 11.5–15.5)
WBC: 16.6 10*3/uL — ABNORMAL HIGH (ref 4.0–10.5)
nRBC: 0 % (ref 0.0–0.2)

## 2021-08-21 MED ORDER — OXYCODONE HCL 5 MG PO TABS: 5.0000 mg | ORAL_TABLET | ORAL | 0 refills | Status: DC | PRN

## 2021-08-21 MED ORDER — CEFTRIAXONE IV (FOR PTA / DISCHARGE USE ONLY): 2.0000 g | INTRAVENOUS | 0 refills | Status: AC

## 2021-08-21 MED ORDER — METRONIDAZOLE 500 MG PO TABS: 500.0000 mg | ORAL_TABLET | Freq: Two times a day (BID) | ORAL | 0 refills | Status: AC

## 2021-08-21 NOTE — Progress Notes (Signed)
Patient ID: [name] [name], male   DOB: 01/13/1956, 65 y.o.   MRN: [id number] [facility] Progress Note   Assessment/ Plan:   1.  Hyponatremia: True hyponatremia with hypo-osmolality and inappropriately elevated urine osmolality reflective of inappropriate ADH activation.  Sodium level improving status post tolvaptan and now with fluid restriction.  He feels comfortable enough to go home and follow-up with labs next week at his primary care providers office.  I will remain available for any concerns arising from his follow-up labs [phone]) 2.  Chronic kidney disease: With intermittent acute kidney injury that appears to be hemodynamically mediated based on episodes of relative hypotension and timing of surgery-renal function essentially unchanged overnight and possibly may be his new baseline. 3.  Left lower extremity cellulitis/abscess/osteomyelitis: Status post partial first ray amputation along with second toe amputation and drainage of abscess by podiatry.  Plans noted for outpatient follow-up with podiatry. 4.  Anemia: Likely related to chronic nonhealing foot ulcers and recent surgery, continue to monitor with overt losses from wound drain.   Subjective:   Denies any chest pain or shortness of breath and reports that he realized yesterday that he was supposed to be on fluid restriction-he was drinking at least 100 ounces of fluid a day.   Objective:   BP 140/72 (BP Location: Left Arm)   Pulse 71   Temp 98.2 F (36.8 C) (Oral)   Resp 18   Ht 6' (1.829 m)   Wt 118.6 kg   SpO2 100%   BMI 35.46 kg/m   Intake/Output Summary (Last 24 hours) at 08/21/2021 1318 Last data filed at 08/21/2021 1035 Gross per 24 hour  Intake 590 ml  Output 1400 ml  Net -810 ml   Weight change:   Physical Exam: Gen: Comfortably resting in bed, watching television CVS: Pulse regular rhythm and normal rate, S1 and S2 normal Resp: Clear to auscultation bilaterally, no rales/rhonchi Abd:  Soft, obese, nontender Ext: Trace lower extremity edema bilaterally.  Right ankle/foot dressing intact-second toe with intact sutures appearing dusky in color.  Imaging: No results found.  Labs: BMET Recent Labs  Lab 08/16/21 0538 08/16/21 0548 08/17/21 0409 08/17/21 1650 08/18/21 0110 08/18/21 0445 08/19/21 0600 08/19/21 2135 08/20/21 0448 08/21/21 0950  NA 124* 124* 120* 124* 124* 124* 122* 125* 124* 127*  K 5.0 4.9 4.9  --   --  5.2* 4.4  --  4.1 3.8  CL 91* 91* 89*  --   --  91* 92*  --  95* 98  CO2 24 24 23   --   --  23 21*  --  20* 19*  GLUCOSE 158* 160* 127*  --   --  149* 121*  --  113* 153*  BUN 36* 35* 38*  --   --  46* 46*  --  39* 35*  CREATININE 0.99 0.94 1.40*  --   --  1.41* 1.46*  --  1.53* 1.41*  CALCIUM 9.8 9.6 9.4  --   --  9.3 9.3  --  9.4 9.8  PHOS  --  4.7*  --   --   --   --  5.1*  --   --  4.4   CBC Recent Labs  Lab 08/18/21 0445 08/19/21 0600 08/20/21 0448 08/21/21 0530  WBC 22.2* 24.0* 19.8* 16.6*  NEUTROABS 16.1* 18.2* 14.7* 12.2*  HGB 10.2* 10.4* 10.2* 9.9*  HCT 30.5* 31.2* 30.3* 29.7*  MCV 84.5 84.8 85.4 85.1  PLT 479* 546* 560*  504*    Medications:     amLODipine  10 mg Oral QHS   aspirin  325 mg Oral Daily   buPROPion  300 mg Oral Daily   calcium carbonate  400 mg of elemental calcium Oral BID   Chlorhexidine Gluconate Cloth  6 each Topical Daily   enoxaparin (LOVENOX) injection  60 mg Subcutaneous Q24H   insulin aspart  0-15 Units Subcutaneous TID WC   insulin aspart  0-5 Units Subcutaneous QHS   insulin aspart  8 Units Subcutaneous TID WC   insulin glargine-yfgn  15 Units Subcutaneous BID   metoprolol tartrate  100 mg Oral BID   metroNIDAZOLE  500 mg Oral Q12H   omega-3 acid ethyl esters  1 g Oral Daily   pantoprazole  40 mg Oral Daily   senna-docusate  2 tablet Oral BID   sodium chloride flush  10-40 mL Intracatheter Q12H   Vitamin D (Ergocalciferol)  50,000 Units Oral Q7 days   Zetta Bills, MD 08/21/2021, 1:18 PM

## 2021-08-21 NOTE — Progress Notes (Signed)
Subjective: POD # 8 s/p return to the operating room for incision and drainage, debridement, washout of right foot infection.  He is feeling fairly well his wife is here at bedside  Objective: AAO x3, NAD-wife is at bedside DP/PT pulses palpable bilaterally, CRT less than 3 seconds Nursing change dressing today no new issues.  We will continue regular changes   CBC normalizing now down to 16.6    A/P POD 8 from repeat debridement and washout  -Doing well and stable for discharge both medically and from a podiatry standpoint.  He will follow-up Dr. Loreta Ave on Thursday.  Continue every other day changes at home with packing to the dorsal incision. -Going home on Rocephin and Flagyl, will follow up with ID -Please call if questions -WBAT to bilateral LE in surgical shoe  Sharl Ma, DPM 08/21/2021

## 2021-08-21 NOTE — Discharge Summary (Signed)
Physician Discharge Summary  DEMPSY [name] EUM:[id number] DOB: [birth year] DOA: 08/08/2021  PCP: Antony Contras, MD  Admit date: 08/08/2021 Discharge date: 08/21/2021  Time spent: 55 minutes  Recommendations for Outpatient Follow-up:  Follow-up with Dr. Jacqualyn Posey, podiatry as scheduled on 08/25/2021 at 3:45 PM. Follow-up with Dr. Tommy Medal, ID on 09/02/2021 at 8:45 AM. Follow-up with Antony Contras, MD on Wednesday, 08/24/2021 for lab work for BMET to follow-up on sodium levels.  May follow-up with PCP again in 1 to 2 weeks for hospital follow-up.   Discharge Diagnoses:  Active Problems:   Toe infection   Cellulitis of right lower extremity   Osteomyelitis of great toe of right foot (HCC)   Osteomyelitis of second toe of right foot (HCC)   Diabetes mellitus due to underlying condition with diabetic autonomic neuropathy, without long-term current use of insulin (HCC)   Class 3 severe obesity with serious comorbidity and body mass index (BMI) of 45.0 to 49.9 in adult [facility])   Elevated LFTs   Post-operative state   Cellulitis of left lower extremity   Streptococcal bacteremia   AKI (acute kidney injury) ([place])   Hyponatremia   Discharge Condition: Stable and improved  Diet recommendation: Heart healthy.  1200 cc/day fluid restriction.  Filed Weights   08/08/21 1149 08/08/21 1738 08/09/21 1625  Weight: 112.9 kg 118.6 kg 118.6 kg    History of present illness:  HPI per Dr. Marylyn Ishihara : [name] [name] is a 65 y.o. male with medical history significant of DM2, HTN, HLD. Presenting LLE cellulitis and right great toe pain. He follows with the wound care center. For the last 2 years, he had had non-healing diabetic foot ulcers per his report. Over the last week or so, he has had worsening erythema, pain and odor in his right right toe. He was seen by the wound care clinic today and fresh dressings were applied. It was recommended that he come to the ED for IV abx and imaging. He denies any other  aggravating or alleviating factors.    ED Course: XR foot and MRI foot were ordered. He was found to have a white count of 19.2. TRH was called for admission.    Hospital Course:  1 right foot ulcer/wound concerning for osteomyelitis -MRI on admission of the right foot concerning for septic arthritis and osteomyelitis of the first proximal and distal phalanges as well as osteomyelitis of the second distal phalanx.  MRI also showing infectious tenosynovitis around the first metatarsal and multiple forefoot abscesses. -Patient seen in consultation by ID and podiatry. -Patient status post partial first ray amputation, partial second toe amputation and debridement of deep abscess of the right foot per Dr. Jacqualyn Posey 08/09/2021. -Due to worsening leukocytosis patient underwent repeat debridement of the right foot on 08/13/2021. -MRI left foot negative for osteomyelitis. -Cultures drawn from right foot showed Streptococcus intermedius sensitive to penicillin. -ID following and patient initially started on IV Rocephin, subsequently transition to IV penicillin and Flagyl to complete a 4-week course of antibiotic treatment per ID recommendations. -Patient placed back on IV Rocephin and IV penicillin discontinued per ID and patient is to continue Flagyl to complete a 4-week course of antibiotic treatment -Patient seen by PT/ OT who are recommending home health therapies. -Outpatient follow-up with podiatry, ID.  2.  Streptococcus intermedius bacteremia: -Noted on blood cultures from 08/08/2021 -Concern likely etiology diabetic foot infection. -2D echo done negative for any valvular vegetations. -Repeat blood cultures with no growth to date. -PICC line placed. -  ID recommending initially 4 weeks of IV penicillin and Flagyl. -IV penicillin changed to IV Rocephin per ID.  -Patient also maintained on Flagyl for total of 4 weeks of treatment. -Patient maintained afebrile.  Due to persistent leukocytosis patient  underwent CT abdomen and pelvis with no abscess noted. -Patient noted to have an irregular circumferentially urinary bladder wall thickening probably chronic. -Patient denied any urinary retention, self catheterizes every once in a while and reports having a repeat urethral stent placed over 15 years ago. -Urine cytology ordered. -We will need outpatient follow-up with ID.   3.  Left lower extremity cellulitis -Improved. -Patient maintained on IV antibiotics.  4.  Elevated liver enzymes -Right upper quadrant ultrasound negative for cholecystitis however did show cholelithiasis. -Asymptomatic. -Outpatient follow-up.  5.  Hyponatremia -Sodium levels fluctuating during the hospitalization going as low as 120 on 08/17/2021.   -Patient being followed by nephrology who feel patient has a true hyponatremia with hyperosmolality and inappropriate elevated urine osmolality reflective of SIADH. -Patient was on Lasix 20 mg daily with last dose 08/16/2021 which was subsequently discontinued due to worsening hyponatremia.   -Patient received multiple doses of Samsca approximately 4-5 during this hospitalization.   -Patient initially was post to be fluid restricted however on day of discharge in discussion patient stated he was not sure about the fluid restriction and was probably drinking more.   -Repeat urine osmolality at 363, urine sodium at 20,, serum osmolality at 273, SPEP for free light chains has been ordered per nephrology. -Sodium levels improved such that by day of discharge sodium level was at 127.  Another dose of Samsca was ordered per nephrology on day of discharge. -Patient will need repeat labs done at PCPs office on Wednesday, 08/24/2021. -Outpatient follow-up with PCP.  6.  Chronic kidney disease stage IIIa -Stable. -Remained at baseline during the hospitalizat. -Baseline 1.5-1.7 -On day of discharge creatinine was 1.41.   -Patient was followed by nephrology during the  hospitalization.    7.  Type 2 diabetes mellitus with hyperglycemia with chronic kidney disease -Hemoglobin A1c 6.2 08/08/2021. -Patient maintained on sliding scale insulin during the hospitalization.   8.  Hyperlipidemia -Maintained on home regimen Lovaza.  9.  Hypertension -Was maintained on home regimen of metoprolol and Norvasc during the hospitalization.  10.  Hyperkalemia -Potassium at 4.1. -Avapro discontinued.   -Patient maintained on a renal diet.    Procedures: CT abdomen and pelvis 08/15/2021 Plain films of the right foot 08/09/2021 Plain films of the left foot 08/08/2021 MRI right foot 08/08/2021 MRI left foot 08/09/2021 Renal ultrasound 08/14/2021 Abdominal ultrasound 08/08/2021 2D echo 08/10/2021 Lower extremity Dopplers 08/17/2021 ABI 08/09/2021 Incision and drainage right foot wound per podiatry: Dr. Earleen Newport 08/13/2021 Partial first ray amputation right foot, partial second toe amputation, I&D deep abscess right foot per podiatry, Dr. Earleen Newport 08/09/2021 Right upper extremity PICC line placement  Consultations: Nephrology: Dr. Posey Pronto 08/15/2021 ID: Dr. Tommy Medal 08/08/2021 Podiatry: Dr. Jacqualyn Posey 08/08/2021  Discharge Exam: Vitals:   08/20/21 2156 08/21/21 0536  BP: 128/73 140/72  Pulse: 83 71  Resp: 18 18  Temp: 98.9 F (37.2 C) 98.2 F (36.8 C)  SpO2: 100% 100%    General: NAD Cardiovascular: RRR no murmurs rubs or gallops.  No JVD.  No lower extremity edema. Respiratory: Clear to auscultation bilaterally.  No wheezes, no crackles, no rhonchi.  Discharge Instructions   Discharge Instructions     [facility] pharmacist to adjust dose for Vancomycin, Aminoglycosides and other anti-infective  therapies as requested by physician.   Complete by: As directed    [facility] to provide Cath Flo 7m   Complete by: As directed    Administer for PICC line occlusion and as ordered by physician for other access device issues.   Anaphylaxis Kit:  Provided to treat any anaphylactic reaction to the medication being provided to the patient if First Dose or when requested by physician   Complete by: As directed    Epinephrine 152mml vial / amp: Administer 0.61m57m0.61ml65mubcutaneously once for moderate to severe anaphylaxis, nurse to call physician and pharmacy when reaction occurs and call 911 if needed for immediate care   Diphenhydramine 50mg78mIV vial: Administer 25-50mg 561mM PRN for first dose reaction, rash, itching, mild reaction, nurse to call physician and pharmacy when reaction occurs   Sodium Chloride 0.9% NS 500ml I68mdminister if needed for hypovolemic blood pressure drop or as ordered by physician after call to physician with anaphylactic reaction   Change dressing on IV access line weekly and PRN   Complete by: As directed    Diet - low sodium heart healthy   Complete by: As directed    1200cc fluid restriction / day   Discharge wound care:   Complete by: As directed    As above   Flush IV access with Sodium Chloride 0.9% and Heparin 10 units/ml or 100 units/ml   Complete by: As directed    Home infusion instructions - [facility]   Complete by: As directed    Instructions: Flush IV access with Sodium Chloride 0.9% and Heparin 10units/ml or 100units/ml   Change dressing on IV access line: Weekly and PRN   Instructions Cath Flo 2mg: Ad52mister for PICC Line occlusion and as ordered by physician for other access device   [facility] pharmacist to adjust dose for: Vancomycin, Aminoglycosides and other anti-infective therapies as requested by physician   Increase activity slowly   Complete by: As directed    Method of administration may be changed at the discretion of home infusion pharmacist based upon assessment of the patient and/or caregiver's ability to self-administer the medication ordered   Complete by: As directed       Allergies as of 08/21/2021   No Known Allergies      Medication List      STOP taking these medications    sildenafil 100 MG tablet Commonly known as: VIAGRA       TAKE these medications    ALPRAZolam 0.5 MG tablet Commonly known as: XANAX Take 0.5 mg by mouth 2 (two) times daily as needed for anxiety.   amLODipine 10 MG tablet Commonly known as: NORVASC Take 10 mg by mouth at bedtime.   aspirin 325 MG tablet Take 325 mg by mouth daily.   atorvastatin 80 MG tablet Commonly known as: LIPITOR Take 80 mg by mouth every evening.   buPROPion 300 MG 24 hr tablet Commonly known as: WELLBUTRIN XL Take 300 mg by mouth every morning.   cefTRIAXone  IVPB Commonly known as: ROCEPHIN Inject 2 g into the vein daily for 21 days. Indication:  streptococcal bacteremia First Dose: No Last Day of Therapy:  09/08/21 Labs - Once weekly:  CBC/D and BMP, Labs - Every other week:  ESR and CRP Method of administration: IV Push Method of administration may be changed at the discretion of home infusion pharmacist based upon assessment of the patient and/or caregiver's ability to self-administer the medication ordered.  fenofibrate 160 MG tablet Take 160 mg by mouth every evening.   fish oil-omega-3 fatty acids 1000 MG capsule Take 1 g by mouth daily.   HumaLOG Mix 50/50 KwikPen (50-50) 100 UNIT/ML Kwikpen Generic drug: Insulin Lispro Prot & Lispro Inject 40 Units into the skin 3 (three) times daily before meals.   metoprolol tartrate 100 MG tablet Commonly known as: LOPRESSOR Take 100 mg by mouth 2 (two) times daily.   metroNIDAZOLE 500 MG tablet Commonly known as: FLAGYL Take 1 tablet (500 mg total) by mouth every 12 (twelve) hours for 19 days.   NovoFine 32G X 6 MM Misc Generic drug: Insulin Pen Needle   olmesartan-hydrochlorothiazide 40-25 MG tablet Commonly known as: BENICAR HCT Take 1 tablet by mouth every morning.   omeprazole 20 MG capsule Commonly known as: PRILOSEC Take 20 mg by mouth every evening.   oxyCODONE 5 MG immediate  release tablet Commonly known as: Oxy IR/ROXICODONE Take 1 tablet (5 mg total) by mouth every 4 (four) hours as needed for moderate pain.   Ozempic (1 MG/DOSE) 2 MG/1.5ML Sopn Generic drug: Semaglutide (1 MG/DOSE) Inject 1 mg into the skin once a week. Friday   polyethylene glycol 17 g packet Commonly known as: MIRALAX / GLYCOLAX Take 17 g by mouth daily as needed for moderate constipation.   potassium chloride SA 20 MEQ tablet Commonly known as: KLOR-CON Take 40 mEq by mouth every morning.   senna-docusate 8.6-50 MG tablet Commonly known as: Senokot-S Take 2 tablets by mouth 2 (two) times daily.   Vitamin D (Ergocalciferol) 1.25 MG (50000 UNIT) Caps capsule Commonly known as: DRISDOL Take 1 capsule (50,000 Units total) by mouth every 7 (seven) days.               Discharge Care Instructions  (From admission, onward)           Start     Ordered   08/21/21 0000  Change dressing on IV access line weekly and PRN  (Home infusion instructions - [facility] )        08/21/21 1350   08/21/21 0000  Discharge wound care:       Comments: As above   08/21/21 1350           No Known Allergies  Follow-up Information     Antony Contras, MD Follow up on 08/24/2021.   Specialty: Family Medicine Why: Needs labwork of BMET to follow sodium levels. Contact information: [place], [place] [address] [phone]         Antony Contras, MD. Schedule an appointment as soon as possible for a visit in 1 week(s).   Specialty: Family Medicine Why: f/u in 1-2 weeks. Contact information: [place], [address] [place] [address] [phone]         Trula Slade, DPM. Schedule an appointment as soon as possible for a visit on 08/25/2021.   Specialty: Podiatry Why: Follow-up at 3:45 PM Contact information: [place] [address] [phone]         Tommy Medal, Lavell Islam, MD Follow up on  09/02/2021.   Specialty: Infectious Diseases Why: Follow-up at 8:45 AM. Contact information: [address] [place] [address] [phone]                  The results of significant diagnostics from this hospitalization (including imaging, microbiology, ancillary and laboratory) are listed below for reference.  Significant Diagnostic Studies: CT ABDOMEN PELVIS W CONTRAST  Result Date: 08/15/2021 CLINICAL DATA:  Sepsis Intrabdominal abscess sepsis, concerned for intraabd abscess. Elevated white blood cell count. EXAM: CT ABDOMEN AND PELVIS WITH CONTRAST TECHNIQUE: Multidetector CT imaging of the abdomen and pelvis was performed using the standard protocol following bolus administration of intravenous contrast. CONTRAST:  43m OMNIPAQUE IOHEXOL 350 MG/ML SOLN COMPARISON:  None. FINDINGS: Lower chest: No acute abnormality. Coronary artery calcifications four-vessel. Hepatobiliary: No focal liver abnormality. Multiple subcentimeter calcified gallstones noted within the gallbladder lumen. No gallbladder wall thickening or pericholecystic fluid. No biliary dilatation. Pancreas: No focal lesion. Normal pancreatic contour. No surrounding inflammatory changes. No main pancreatic ductal dilatation. Spleen: Normal in size without focal abnormality. Adrenals/Urinary Tract: No nodularity of the right adrenal gland. There is a 1.9 cm left adrenal gland nodule with a density of 28 Hounsfield units. Nonspecific bilateral perinephric stranding. Bilateral kidneys enhance symmetrically. Subcentimeter hypodensities are too small to characterize. No hydronephrosis. No hydroureter. Irregular circumferential urinary bladder wall thickening. Stomach/Bowel: PO contrast reaches the sigmoid colon. Stomach is under distended and within normal limits. No evidence of bowel wall thickening or dilatation. Appendix appears normal. Vascular/Lymphatic: No abdominal aorta or iliac aneurysm. Mild-to-moderate  atherosclerotic plaque of the aorta and its branches. No abdominal, pelvic, or inguinal lymphadenopathy. Reproductive: Status post hysterectomy. No adnexal masses. Other: No intraperitoneal free fluid. No intraperitoneal free gas. No organized fluid collection. Musculoskeletal: No abdominal wall hernia or abnormality. Subcutaneus soft tissue edema and emphysema along the bilateral lower anterior abdominal wall likely related to medication injection. Densely sclerotic lesion within the L2 and L5 vertebral bodies likely represent bone islands. No suspicious lytic or blastic osseous lesions. No acute displaced fracture. Multilevel degenerative changes of the spine. IMPRESSION: 1. Irregular circumferential urinary bladder wall thickening. Finding may represent underlying infection or malignancy. Correlate with urinalysis. Consider direct visualization. 2. Subcutaneus soft tissue edema and emphysema along the bilateral lower anterior abdominal wall likely related to medication injection. Correlate with clinical history/physical exam. 3. Cholelithiasis with no CT findings of acute cholecystitis or choledocholithiasis. 4. Indeterminate 1.9 cm left adrenal gland nodule. Recommend CT adrenal gland protocol in 1 year for further evaluation and evaluation of stability. 5. Densely sclerotic lesion within the L2 and L5 vertebral bodies likely represent bone islands. Recommend attention on follow-up. 6.  Aortic Atherosclerosis (ICD10-I70.0). Electronically Signed   By: MIven FinnM.D.   On: 08/15/2021 20:56   [place]NAL  Result Date: 08/14/2021 CLINICAL DATA:  AK I EXAM: RENAL / URINARY TRACT ULTRASOUND COMPLETE COMPARISON:  August 08, 2021 FINDINGS: Right Kidney: Renal measurements: 11.0 x 5.8 x 5.3 cm = volume: 178 mL. Echogenicity within normal limits. No hydronephrosis. Kidney is lobular in appearance. There is a possible exophytic hypoechoic mass which measures 10 x 9 x 11 mm. There is an additional hypoechoic mass  with suggestion of posterior acoustic enhancement in the lower pole which measures 15 x 24 x 18 mm. Left Kidney: Renal measurements: 10.9 x 5.5 x 4.8 cm = volume: 151 mL. Echogenicity within normal limits. No mass or hydronephrosis visualized. Kidney is lobular in appearance. Bladder: Appears normal for degree of bladder distention. Other: Small amount of free fluid adjacent to the RIGHT kidney. IMPRESSION: 1. No hydronephrosis. 2. Indeterminate RIGHT renal masses, likely complicated cysts. Recommend follow-up evaluation with contrast-enhanced renal cyst protocol CT or MRI when clinically appropriate. 3. Small volume free fluid adjacent to the RIGHT kidney, nonspecific. Electronically Signed   By: SValentino SaxonM.D.  On: 08/14/2021 17:01   MRI Right foot without contrast  Result Date: 08/08/2021 CLINICAL DATA:  Right great toe pain. EXAM: MRI OF THE RIGHT FOREFOOT WITHOUT CONTRAST TECHNIQUE: Multiplanar, multisequence MR imaging of the right forefoot was performed. No intravenous contrast was administered. COMPARISON:  Right great toe x-rays from same day. Right foot x-rays dated July 13, 2021. FINDINGS: Bones/Joint/Cartilage Abnormal marrow edema with corresponding decreased T1 marrow signal involving the first proximal and distal phalanges. Similar signal abnormality involving the second distal phalanx. Pathologic fracture of the base of the first distal phalanx. No dislocation. Mild hallux valgus deformity and first MTP joint osteoarthritis. Large first IP joint effusion. Ligaments The first IP joint collateral ligaments are not visualized. Muscles and Tendons The flexor pollicis longus tendon is completely torn and retracted to the mid first metatarsal (series 9, image 23). Associated tenosynovitis. Increased T2 signal within and atrophy of the intrinsic muscles of the forefoot, nonspecific, but likely related to diabetic muscle changes. Soft tissue Diffuse soft tissue swelling. Ulceration at the  plantar aspect of the first IP joint communicating with the joint space (series 9, image 23). 1.8 x 0.9 x 3.5 cm curvilinear gas and fluid collection along the dorsal and lateral aspect of the great toe, consistent with abscess (series 6, image 18). 1.8 x 1.1 x 1.1 cm fluid collection between the first and second metatarsals (series 7, image 15). Additional multi loculated fluid collection in the superficial dorsal foot overlying the second and third mid metatarsals, measuring 2.7 x 1.8 x 0.7 cm (series 9, image 13). Soft tissue ulceration at the tip of the second toe extending to bone. IMPRESSION: 1. Ulceration at the plantar aspect of the first IP joint communicating with the joint space with underlying first IP joint septic arthritis and osteomyelitis of the first proximal and distal phalanges. Pathologic fracture of the base of the first distal phalanx. 2. Ulceration at the tip of the second toe with underlying osteomyelitis of the second distal phalanx. 3. Complete tear of the flexor pollicis longus tendon with retraction to the mid first metatarsal. Associated infectious tenosynovitis. 4. Multiple forefoot abscesses as described above, the largest a curvilinear abscess involving the dorsal and lateral aspect of the great toe, measuring up to 3.5 cm. Electronically Signed   By: Titus Dubin M.D.   On: 08/08/2021 17:37   MR FOOT LEFT WO CONTRAST  Result Date: 08/09/2021 CLINICAL DATA:  Diabetic foot ulcer EXAM: MRI OF THE LEFT FOOT WITHOUT CONTRAST TECHNIQUE: Multiplanar, multisequence MR imaging of the left forefoot was performed. No intravenous contrast was administered. COMPARISON:  X-ray 08/08/2021 FINDINGS: Bones/Joint/Cartilage No acute fracture or dislocation. No bone marrow edema or periostitis. No focal erosion or marrow replacement. The second toe PIP joint is fused. Mild degenerative changes throughout the left forefoot, most notably involving the first MTP joint. Moderate arthropathy of the  midfoot. Small first MTP joint effusion, nonspecific. Ligaments Intact Lisfranc ligament. Collateral ligaments of the forefoot appear intact. Muscles and Tendons Intact flexor and extensor tendons. Denervation changes of the intrinsic foot musculature. Soft tissues Shallow soft tissue ulceration overlies the plantar aspect of the great toe distal phalanx. Mild associated soft tissue swelling and skin thickening. No organized fluid collection. IMPRESSION: 1. No evidence of acute osteomyelitis of the left forefoot. 2. Shallow soft tissue ulceration overlies the plantar aspect of the great toe distal phalanx. Mild associated soft tissue swelling and skin thickening. No organized fluid collection. 3. Mild degenerative changes throughout the left forefoot, most notably  involving the first MTP joint. Moderate arthropathy of the midfoot. Electronically Signed   By: Davina Poke D.O.   On: 08/09/2021 17:12   DG Foot 2 Views Right  Result Date: 08/09/2021 CLINICAL DATA:  Post right foot surgery. EXAM: RIGHT FOOT - 2 VIEW COMPARISON:  08/08/2021 FINDINGS: Interval amputation of the right first toe at the mid metatarsal level. Interval amputation of the right second toe at the distal interphalangeal joint level. Margins of resection appear intact. Soft tissue gas in the area of resection is consistent with recent surgery. Degenerative changes in the intertarsal joints. Small calcaneal spurs. IMPRESSION: Interval amputation of the right first toe at the mid metatarsal level and of the right second toe at the distal interphalangeal joint level. Electronically Signed   By: Lucienne Capers M.D.   On: 08/09/2021 20:31   DG Foot Complete Left  Result Date: 08/08/2021 CLINICAL DATA:  Ulcer at the base of the first toe. EXAM: LEFT FOOT - COMPLETE 3+ VIEW COMPARISON:  MRI right toe 08/08/2021.  Right toe x-ray 08/08/2021. FINDINGS: There is soft tissue swelling of the first toe. There is no radiopaque foreign body. There  is no underlying cortical erosion. There is no acute fracture or dislocation. Bony fusion is identified across the second proximal interphalangeal joint. Plantar and posterior calcaneal spurs are present. There are some nonspecific soft tissue calcifications along the plantar surface of the calcaneus. There is also degenerative spurring of the dorsal midfoot. IMPRESSION: 1. Soft tissue swelling of the left first toe. 2. No acute bony abnormality. 3. Degenerative changes of the left foot. Electronically Signed   By: Ronney Asters M.D.   On: 08/08/2021 20:31   DG Toe Great Right  Result Date: 08/08/2021 CLINICAL DATA:  First toe pain and underlying infection EXAM: RIGHT GREAT TOE COMPARISON:  07/13/2021 FINDINGS: There is now seen considerable lucency within the base of the first distal phalanx as well as fragmentation. Soft tissue wound is again identified. Soft tissue wound is also noted at the tip of the second distal phalanx with some suspicious findings of bony erosion. These findings are both consistent with osteomyelitis. The lucency seen previously at the medial base of the first proximal phalanx is again seen and stable. Hallux valgus deformity is again noted. IMPRESSION: Increased lucency and fragmentation at the base of the first distal phalanx consistent with osteomyelitis. Overlying soft tissue wound is again seen. A wound is noted at the tip of the second toe as well as some bony erosive changes in the tip of the distal phalanx suspicious for osteomyelitis. Electronically Signed   By: Inez Catalina M.D.   On: 08/08/2021 15:58   VAS [place] ABI WITH/WO TBI  Result Date: 08/09/2021  LOWER EXTREMITY DOPPLER STUDY Patient Name:  NAJIB COLMENARES  Date of Exam:   08/09/2021 Medical Rec #: [id number]       Accession #:    [id number] Date of Birth: 12/22/1955       Patient Gender: M Patient Age:   32 years Exam Location:  [facility] Procedure:      VAS [place] ABI WITH/WO TBI Referring Phys: CORNELIUS VAN DAM  --------------------------------------------------------------------------------  Indications: Ulceration, and Bilateral DM toe ulcers/infection. High Risk Factors: Hypertension, hyperlipidemia, Diabetes, no history of                    smoking. Other Factors: CAD S/P CABGx5.  Comparison Study: No previous exams Performing Technologist: Rogelia Rohrer RVT, RDMS  Examination  Guidelines: A complete evaluation includes at minimum, Doppler waveform signals and systolic blood pressure reading at the level of bilateral brachial, anterior tibial, and posterior tibial arteries, when vessel segments are accessible. Bilateral testing is considered an integral part of a complete examination. Photoelectric Plethysmograph (PPG) waveforms and toe systolic pressure readings are included as required and additional duplex testing as needed. Limited examinations for reoccurring indications may be performed as noted.  ABI Findings: +---------+------------------+-----+---------+--------+ Right    Rt Pressure (mmHg)IndexWaveform Comment  +---------+------------------+-----+---------+--------+ Brachial 163                    triphasic         +---------+------------------+-----+---------+--------+ PTA      200               1.22 triphasic         +---------+------------------+-----+---------+--------+ DP       209               1.27 triphasic         +---------+------------------+-----+---------+--------+ Doristine Devoid Toe186               1.13 Normal            +---------+------------------+-----+---------+--------+ +---------+------------------+-----+---------+-------+ Left     Lt Pressure (mmHg)IndexWaveform Comment +---------+------------------+-----+---------+-------+ Brachial 164                    triphasic        +---------+------------------+-----+---------+-------+ PTA      194               1.18 triphasic        +---------+------------------+-----+---------+-------+ DP       176                1.07 triphasic        +---------+------------------+-----+---------+-------+ Great Toe121               0.74 Normal           +---------+------------------+-----+---------+-------+ +-------+-----------+-----------+------------+------------+ ABI/TBIToday's ABIToday's TBIPrevious ABIPrevious TBI +-------+-----------+-----------+------------+------------+ Right  1.27       1.13                                +-------+-----------+-----------+------------+------------+ Left   1.18       0.74                                +-------+-----------+-----------+------------+------------+ For TBIs - 2nd digit used due to both great toes having open wounds and bandaged.  Summary: Right: Resting right ankle-brachial index is within normal range. No evidence of significant right lower extremity arterial disease. The right toe-brachial index is normal. Left: Resting left ankle-brachial index is within normal range. No evidence of significant left lower extremity arterial disease. The left toe-brachial index is normal.  *See table(s) above for measurements and observations.  Electronically signed by Deitra Mayo MD on 08/09/2021 at 4:04:59 PM.    Final    ECHOCARDIOGRAM COMPLETE  Result Date: 08/11/2021    ECHOCARDIOGRAM REPORT   Patient Name:   DAEMIEN FRONCZAK Date of Exam: 08/10/2021 Medical Rec #:  [id number]      Height:       72.0 in Accession #:    [id number]     Weight:       261.5 lb Date of Birth:  Nov 14, 1956  BSA:          2.388 m Patient Age:    36 years       BP:           152/91 mmHg Patient Gender: M              HR:           75 bpm. Exam Location:  Inpatient Procedure: 2D Echo, Cardiac Doppler and Color Doppler Indications:    bacteremia  History:        Patient has no prior history of Echocardiogram examinations.                 CAD; Risk Factors:Diabetes, Hypertension and Dyslipidemia.  Sonographer:    Alvino Chapel RCS Referring Phys: [id number] [place]   1. Left ventricular ejection fraction, by estimation, is 55 to 60%. The left ventricle has normal function. The left ventricle has no regional wall motion abnormalities. There is mild left ventricular hypertrophy. Left ventricular diastolic parameters are consistent with Grade I diastolic dysfunction (impaired relaxation).  2. Right ventricular systolic function is normal. The right ventricular size is normal. Tricuspid regurgitation signal is inadequate for assessing PA pressure.  3. The mitral valve is grossly normal. Trivial mitral valve regurgitation. No evidence of mitral stenosis.  4. The aortic valve is tricuspid. Aortic valve regurgitation is not visualized. No aortic stenosis is present.  5. The inferior vena cava is normal in size with greater than 50% respiratory variability, suggesting right atrial pressure of 3 mmHg. Conclusion(s)/Recommendation(s): No evidence of valvular vegetations on this transthoracic echocardiogram. Consider a transesophageal echocardiogram to exclude infective endocarditis if clinically indicated. FINDINGS  Left Ventricle: Left ventricular ejection fraction, by estimation, is 55 to 60%. The left ventricle has normal function. The left ventricle has no regional wall motion abnormalities. The left ventricular internal cavity size was normal in size. There is  mild left ventricular hypertrophy. Left ventricular diastolic parameters are consistent with Grade I diastolic dysfunction (impaired relaxation). Right Ventricle: The right ventricular size is normal. No increase in right ventricular wall thickness. Right ventricular systolic function is normal. Tricuspid regurgitation signal is inadequate for assessing PA pressure. Left Atrium: Left atrial size was normal in size. Right Atrium: Right atrial size was normal in size. Pericardium: There is no evidence of pericardial effusion. Mitral Valve: The mitral valve is grossly normal. Trivial mitral valve regurgitation. No evidence of  mitral valve stenosis. Tricuspid Valve: The tricuspid valve is normal in structure. Tricuspid valve regurgitation is trivial. No evidence of tricuspid stenosis. Aortic Valve: The aortic valve is tricuspid. Aortic valve regurgitation is not visualized. No aortic stenosis is present. Pulmonic Valve: The pulmonic valve was normal in structure. Pulmonic valve regurgitation is trivial. No evidence of pulmonic stenosis. Aorta: The aortic root is normal in size and structure. Venous: The inferior vena cava is normal in size with greater than 50% respiratory variability, suggesting right atrial pressure of 3 mmHg. IAS/Shunts: The interatrial septum appears to be lipomatous. No atrial level shunt detected by color flow Doppler.  LEFT VENTRICLE PLAX 2D LVIDd:         5.40 cm  Diastology LVIDs:         3.50 cm  LV e' medial:    5.00 cm/s LV PW:         1.20 cm  LV E/e' medial:  13.6 LV IVS:        1.30 cm  LV e' lateral:  6.74 cm/s LVOT diam:     1.80 cm  LV E/e' lateral: 10.1 LV SV:         58 LV SV Index:   24 LVOT Area:     2.54 cm  RIGHT VENTRICLE RV S prime:     12.80 cm/s TAPSE (M-mode): 1.5 cm LEFT ATRIUM           Index       RIGHT ATRIUM           Index LA diam:      4.00 cm 1.68 cm/m  RA Area:     16.80 cm LA Vol (A4C): 48.4 ml 20.27 ml/m RA Volume:   50.10 ml  20.98 ml/m  AORTIC VALVE LVOT Vmax:   127.00 cm/s LVOT Vmean:  91.500 cm/s LVOT VTI:    0.227 m  AORTA Ao Root diam: 3.70 cm MITRAL VALVE MV Area (PHT): 2.93 cm    SHUNTS MV Decel Time: 259 msec    Systemic VTI:  0.23 m MV E velocity: 68.20 cm/s  Systemic Diam: 1.80 cm MV A velocity: 72.50 cm/s MV E/A ratio:  0.94 Cherlynn Kaiser MD Electronically signed by Cherlynn Kaiser MD Signature Date/Time: 08/11/2021/4:40:14 AM    Final    VAS [place] LOWER EXTREMITY VENOUS (DVT)  Result Date: 08/17/2021  Lower Venous DVT Study Patient Name:  KAIEN PEZZULLO  Date of Exam:   08/17/2021 Medical Rec #: [id number]       Accession #:    [id number] Date of Birth: Aug 15, 1956        Patient Gender: M Patient Age:   31 years Exam Location:  [facility] Procedure:      VAS [place] LOWER EXTREMITY VENOUS (DVT) Referring Phys: Hosie Poisson --------------------------------------------------------------------------------  Indications: Pain, and Swelling.  Comparison Study: No previous exams Performing Technologist: Jody Hill RVT, RDMS  Examination Guidelines: A complete evaluation includes B-mode imaging, spectral Doppler, color Doppler, and power Doppler as needed of all accessible portions of each vessel. Bilateral testing is considered an integral part of a complete examination. Limited examinations for reoccurring indications may be performed as noted. The reflux portion of the exam is performed with the patient in reverse Trendelenburg.  +-----+---------------+---------+-----------+----------+--------------+ RIGHTCompressibilityPhasicitySpontaneityPropertiesThrombus Aging +-----+---------------+---------+-----------+----------+--------------+ CFV  Full           Yes      Yes                                 +-----+---------------+---------+-----------+----------+--------------+   +---------+---------------+---------+-----------+----------+--------------+ LEFT     CompressibilityPhasicitySpontaneityPropertiesThrombus Aging +---------+---------------+---------+-----------+----------+--------------+ CFV      Full           Yes      Yes                                 +---------+---------------+---------+-----------+----------+--------------+ SFJ      Full                                                        +---------+---------------+---------+-----------+----------+--------------+ FV Prox  Full           Yes      Yes                                 +---------+---------------+---------+-----------+----------+--------------+  FV Mid   Full           Yes      Yes                                  +---------+---------------+---------+-----------+----------+--------------+ FV DistalFull           Yes      Yes                                 +---------+---------------+---------+-----------+----------+--------------+ PFV      Full                                                        +---------+---------------+---------+-----------+----------+--------------+ POP      Full           Yes      Yes                                 +---------+---------------+---------+-----------+----------+--------------+ PTV      Full                                                        +---------+---------------+---------+-----------+----------+--------------+ PERO     Full                                                        +---------+---------------+---------+-----------+----------+--------------+     Summary: RIGHT: - No evidence of common femoral vein obstruction.  LEFT: - There is no evidence of deep vein thrombosis in the lower extremity. - There is no evidence of superficial venous thrombosis.  - Two large cystic structure is found in the popliteal fossa. Subcutaneous edema in area of calf.  *See table(s) above for measurements and observations. Electronically signed by Jamelle Haring on 08/17/2021 at 5:26:59 PM.    Final    [place] EKG SITE RITE  Result Date: 08/12/2021 If Site Rite image not attached, placement could not be confirmed due to current cardiac rhythm.  US Abdomen Limited RUQ (LIVER/GB)  Result Date: 08/08/2021 CLINICAL DATA:  Elevated LFTs EXAM: ULTRASOUND ABDOMEN LIMITED RIGHT UPPER QUADRANT COMPARISON:  None. FINDINGS: Gallbladder: Gallbladder is well distended with multiple gallstones within. Negative sonographic Murphy's sign is elicited. No pericholecystic fluid or wall thickening is noted. Common bile duct: Diameter: 3.4 mm. Liver: No focal lesion identified. Within normal limits in parenchymal echogenicity. Portal vein is patent on color Doppler imaging with  normal direction of blood flow towards the liver. Other: None. IMPRESSION: Cholelithiasis without complicating factors. Electronically Signed   By: Inez Catalina M.D.   On: 08/08/2021 22:09    Microbiology: Recent Results (from the past 240 hour(s))  Urine Culture     Status: None   Collection Time: 08/16/21  1:47 PM   Specimen: Urine, Random  Result Value Ref  Range Status   Specimen Description   Final    URINE, RANDOM Performed at [facility], [place] [address] [place], [place] [address]    Special Requests   Final    NONE Performed at [facility], [place] [address] [place], [place] [address]    Culture   Final    NO GROWTH Performed at [facility], [place] [address], [place], [place] [address]    Report Status 08/17/2021 FINAL  Final     Labs: Basic Metabolic Panel: Recent Labs  Lab 08/16/21 0548 08/17/21 0409 08/17/21 1650 08/18/21 0445 08/19/21 0600 08/19/21 2135 08/20/21 0448 08/21/21 0950  NA 124* 120*   < > 124* 122* 125* 124* 127*  K 4.9 4.9  --  5.2* 4.4  --  4.1 3.8  CL 91* 89*  --  91* 92*  --  95* 98  CO2 24 23  --  23 21*  --  20* 19*  GLUCOSE 160* 127*  --  149* 121*  --  113* 153*  BUN 35* 38*  --  46* 46*  --  39* 35*  CREATININE 0.94 1.40*  --  1.41* 1.46*  --  1.53* 1.41*  CALCIUM 9.6 9.4  --  9.3 9.3  --  9.4 9.8  PHOS 4.7*  --   --   --  5.1*  --   --  4.4   < > = values in this interval not displayed.   Liver Function Tests: Recent Labs  Lab 08/16/21 0548 08/17/21 1100 08/19/21 0600 08/20/21 0448 08/21/21 0950  AST  --  27  --  24  --   ALT  --  29  --  27  --   ALKPHOS  --  158*  --  164*  --   BILITOT  --  0.8  --  0.7  --   PROT  --  6.8  --  7.5  --   ALBUMIN 2.1* 2.1* 2.2* 2.3* 2.4*   No results for input(s): LIPASE, AMYLASE in the last 168 hours. No results for input(s): AMMONIA in the last 168 hours. CBC: Recent Labs  Lab 08/17/21 0409 08/18/21 0445 08/19/21 0600 08/20/21 0448  08/21/21 0530  WBC 26.5* 22.2* 24.0* 19.8* 16.6*  NEUTROABS 18.9* 16.1* 18.2* 14.7* 12.2*  HGB 10.5* 10.2* 10.4* 10.2* 9.9*  HCT 30.7* 30.5* 31.2* 30.3* 29.7*  MCV 84.6 84.5 84.8 85.4 85.1  PLT 465* 479* 546* 560* 504*   Cardiac Enzymes: No results for input(s): CKTOTAL, CKMB, CKMBINDEX, TROPONINI in the last 168 hours. BNP: BNP (last 3 results) No results for input(s): BNP in the last 8760 hours.  ProBNP (last 3 results) No results for input(s): PROBNP in the last 8760 hours.  CBG: Recent Labs  Lab 08/20/21 0730 08/20/21 1159 08/20/21 1734 08/20/21 2158 08/21/21 0741  GLUCAP 121* 251* 169* 115* 144*       Signed:  Irine Seal MD.  [facility] 08/21/2021, 4:46 PM

## 2021-08-21 NOTE — Discharge Instructions (Signed)
RN to arrive 08/22/21 for first home infusion.

## 2021-08-21 NOTE — Progress Notes (Signed)
Pt to be discharged home with all belongings. Discharge education completed with pt. Discussed wound care at length, pt able to verbalize as well as demonstrate proper dressing changes. Discussed signs and symptoms of infection to be aware of, as well as things to notify PCP of following discharge. [facility] home infusion RN scheduled to administer pt's first home infusion 08/22/21. Pt made aware. Discussed home PICC line care at length with pt, including signs and symptoms of infection and the importance of maintaining an intact central line dressing. Pt states he has no questions or concerns at this time. IV abx administered prior to discharge per order.

## 2021-08-21 NOTE — TOC Transition Note (Addendum)
Transition of Care [facility]) - CM/SW Discharge Note   Patient Details  Name: [name] [name] MRN: [id number] Date of Birth: [birth year]  [facility] CM/SW Contact:  Golda Acre, RN Phone Number: 08/21/2021, 2:19 PM   Clinical Narrative:    [facility] with [facility] for the home iv abx next dose is at 2200 tonight. Wcb. Per Jeri Modena all is in place for tonight's dose and hhc will start on [id number] throught [facility].    Barriers to Discharge: Continued Medical Work up   Patient Goals and CMS Choice        Discharge Placement                       Discharge Plan and Services   Discharge Planning Services: CM Consult                      HH Arranged: RN, IV Antibiotics HH Agency: [facility] Date HH Agency Contacted: 08/11/21   Representative spoke with at [facility] Agency: Pam  Social Determinants of Health (SDOH) Interventions     Readmission Risk Interventions No flowsheet data found.

## 2021-08-22 LAB — GLUCOSE, CAPILLARY: Glucose-Capillary: 144 mg/dL — ABNORMAL HIGH (ref 70–99)

## 2021-08-23 LAB — KAPPA/LAMBDA LIGHT CHAINS
Kappa free light chain: 115.2 mg/L — ABNORMAL HIGH (ref 3.3–19.4)
Kappa, lambda light chain ratio: 1.62 (ref 0.26–1.65)
Lambda free light chains: 71 mg/L — ABNORMAL HIGH (ref 5.7–26.3)

## 2021-08-24 LAB — PROTEIN ELECTROPHORESIS, SERUM
A/G Ratio: 0.5 — ABNORMAL LOW (ref 0.7–1.7)
Albumin ELP: 2.2 g/dL — ABNORMAL LOW (ref 2.9–4.4)
Alpha-1-Globulin: 0.4 g/dL (ref 0.0–0.4)
Alpha-2-Globulin: 0.9 g/dL (ref 0.4–1.0)
Beta Globulin: 1 g/dL (ref 0.7–1.3)
Gamma Globulin: 1.9 g/dL — ABNORMAL HIGH (ref 0.4–1.8)
Globulin, Total: 4.1 g/dL — ABNORMAL HIGH (ref 2.2–3.9)
Total Protein ELP: 6.3 g/dL (ref 6.0–8.5)

## 2021-08-25 ENCOUNTER — Other Ambulatory Visit: Payer: Self-pay

## 2021-08-25 ENCOUNTER — Ambulatory Visit (INDEPENDENT_AMBULATORY_CARE_PROVIDER_SITE_OTHER): Payer: 59

## 2021-08-25 ENCOUNTER — Ambulatory Visit (INDEPENDENT_AMBULATORY_CARE_PROVIDER_SITE_OTHER): Payer: 59 | Admitting: Podiatry

## 2021-08-25 DIAGNOSIS — Z899 Acquired absence of limb, unspecified: Secondary | ICD-10-CM

## 2021-08-25 DIAGNOSIS — L03031 Cellulitis of right toe: Secondary | ICD-10-CM

## 2021-08-25 DIAGNOSIS — L02611 Cutaneous abscess of right foot: Secondary | ICD-10-CM

## 2021-08-31 NOTE — Progress Notes (Signed)
Subjective: [name] [name] is a 65 y.o. is seen today in office s/p right partial first amputation, partial second toe amputation, I&D.  States he has been doing well.  His wife has been changing the bandage.  Still on IV antibiotics.    Objective: General: No acute distress, AAOx3  DP/PT pulses palpable 2/4, CRT < 3 sec to all digits.  Right foot: Incision is well coapted without any evidence of dehiscence for the amputation site.  On the dorsal aspect of the foot the wound is packed open and upon removal of the packing there is no purulence identified from the incisions.  Mild edema present but overall much improved.  There is no significant erythema, cellulitis.  Left foot: Granular wound present plantar aspect of the hallux without any drainage or pus, ascending cellulitis.  No significant cellulitis to the left lower extremity that was present while in the hospital.  No other areas of tenderness to bilateral lower extremities.  No pain with calf compression, swelling, warmth, erythema.   Assessment and Plan:  Status post right partial first ray, partial second toe amputation, I&D, doing well with no complications   -Treatment options discussed including all alternatives, risks, and complications -Dressings were removed and the incisions were cleaned.  Irrigated the dorsal wound.  We packed the dorsal wound and Betadine was applied to the wound followed by dressing.  Continue with dressing changes.  Continue nonweightbearing, elevation.  We will try get home physical therapy arranged. -Elevation -Pain medication as needed. -Monitor for any clinical signs or symptoms of infection and DVT/PE and directed to call the office immediately should any occur or go to the ER. -Follow-up as scheduled or sooner if any problems arise. In the meantime, encouraged to call the office with any questions, concerns, change in symptoms.   Ovid Curd, DPM

## 2021-09-01 ENCOUNTER — Other Ambulatory Visit: Payer: Self-pay | Admitting: Podiatry

## 2021-09-01 ENCOUNTER — Telehealth: Payer: Self-pay | Admitting: *Deleted

## 2021-09-01 DIAGNOSIS — L03031 Cellulitis of right toe: Secondary | ICD-10-CM

## 2021-09-01 DIAGNOSIS — L02611 Cutaneous abscess of right foot: Secondary | ICD-10-CM

## 2021-09-01 NOTE — Telephone Encounter (Signed)
Called and asked [facility] concerning only taking Medicare patients, said that as of right now it's on a first come first serve basis, they are telling everyone to go ahead and fax referrals,will contact if unable to take on the patient.  Faxed referral to [facility],received confirmation.

## 2021-09-02 ENCOUNTER — Encounter (HOSPITAL_COMMUNITY): Payer: 59

## 2021-09-02 ENCOUNTER — Ambulatory Visit (HOSPITAL_COMMUNITY)
Admission: RE | Admit: 2021-09-02 | Discharge: 2021-09-02 | Disposition: A | Discharge status: Home / Self Care | Payer: 59 | Source: Ambulatory Visit | Attending: Infectious Disease | Admitting: Infectious Disease

## 2021-09-02 ENCOUNTER — Ambulatory Visit (INDEPENDENT_AMBULATORY_CARE_PROVIDER_SITE_OTHER): Payer: 59 | Admitting: Podiatry

## 2021-09-02 ENCOUNTER — Other Ambulatory Visit: Payer: Self-pay

## 2021-09-02 ENCOUNTER — Other Ambulatory Visit: Payer: Self-pay | Admitting: *Deleted

## 2021-09-02 ENCOUNTER — Telehealth: Payer: Self-pay

## 2021-09-02 ENCOUNTER — Ambulatory Visit (INDEPENDENT_AMBULATORY_CARE_PROVIDER_SITE_OTHER): Payer: 59 | Admitting: Infectious Disease

## 2021-09-02 ENCOUNTER — Encounter: Payer: Self-pay | Admitting: Infectious Disease

## 2021-09-02 VITALS — BP 137/68 | HR 72 | Temp 97.8°F | Ht 72.0 in | Wt 245.0 lb

## 2021-09-02 DIAGNOSIS — E0843 Diabetes mellitus due to underlying condition with diabetic autonomic (poly)neuropathy: Secondary | ICD-10-CM

## 2021-09-02 DIAGNOSIS — M869 Osteomyelitis, unspecified: Secondary | ICD-10-CM

## 2021-09-02 DIAGNOSIS — Z899 Acquired absence of limb, unspecified: Secondary | ICD-10-CM

## 2021-09-02 DIAGNOSIS — E1149 Type 2 diabetes mellitus with other diabetic neurological complication: Secondary | ICD-10-CM

## 2021-09-02 DIAGNOSIS — E1159 Type 2 diabetes mellitus with other circulatory complications: Secondary | ICD-10-CM

## 2021-09-02 DIAGNOSIS — R7881 Bacteremia: Secondary | ICD-10-CM

## 2021-09-02 DIAGNOSIS — N179 Acute kidney failure, unspecified: Secondary | ICD-10-CM

## 2021-09-02 DIAGNOSIS — L97512 Non-pressure chronic ulcer of other part of right foot with fat layer exposed: Secondary | ICD-10-CM

## 2021-09-02 DIAGNOSIS — B955 Unspecified streptococcus as the cause of diseases classified elsewhere: Secondary | ICD-10-CM

## 2021-09-02 MED ORDER — AMOXICILLIN-POT CLAVULANATE 875-125 MG PO TABS: 1.0000 | ORAL_TABLET | Freq: Two times a day (BID) | ORAL | 1 refills | Status: DC

## 2021-09-02 NOTE — Telephone Encounter (Signed)
Relayed verbal orders to [place] at [facility] per Dr. Daiva Eves that okay to pull PICC after last dose and to get repeat BMP 10/17. Orders repeated and verified.   Sandie Ano, RN

## 2021-09-02 NOTE — Progress Notes (Signed)
Subjective:  Complaint follow-up for diabetic foot infection with osteomyelitis bacteremia on antibiotics also with left leg swelling   Patient ID: [name] [name], male    DOB: 1956/04/16, 65 y.o.   MRN: [id number]  HPI  65 y.o. male with diabetes mellitus obesity, coronary artery disease who has been struggling with bilateral diabetic foot ulcers now admitted with acute worsening of drainage from his right great toe also with onset of erythema consistent with cellulitis in his left lower extremity.  Plain films of shown osteomyelitis in the still phalanx of the great toe and second toe on the right.   RI shows the ulceration that is obvious on exam with communication of the joint space and first IP joint with septic arthritis and osteomyelitis of first proximal and distal phalanges with pathological fracture of the base of the first distal phalanx with ulceration of the tip of the second toe with underlying osteomyelitis the second distal phalanx tear of the flexor pollicis longus and infectious tenosynovitis with multiple forefoot abscess   He also has an ulcer in his left great toe as well   Fortunately MRI did not show evidence of osteomyelitis in that foot  Patient was taken the operating room and went underwent first toe and partial amputation of second toe.  Cultures grew Streptococcus intermedius and group B streptococcus as well as a beta-lactamase positive Prevotella.  Grew strep intermedius from blood cultures as well.  2D echocardiogram failed to show evidence of endocarditis.  Why was in the hospital I last saw him we had switched him to continuous penicillin along with oral metronidazole.  He ended up changing eventually to ceftriaxone and metronidazole and was discharged home with plans to complete antibiotics on 20 October.  Been followed closely by Dr. Earleen Newport with podiatry and who is happy with the progress of the foot.  His inflammatory markers show relative stability  of his sed rate which she went been 37 and 44 in the inpatient world and was 43 when checked on the 10th with home health.  CRP had been elevated at 18.4 in 26 September and has come down since then more recently to 19.  He did have worsening of his renal function with a creatinine having bumped up to 2 on October 10.  He says he saw his primary care doctor yesterday the 13th and had a repeat metabolic panel checked with increased fluid intake and that his kidney function was listed as being normal.  He does have worsening swelling in his left leg where he was found to have a Baker's cyst when he was in the hospital.  Cellulitis in the same leg is long since resolved.        Past Medical History:  Diagnosis Date   Anxiety    Bulbous urethral stricture    chronic--- hx multiple dilatation's and urolume stent's   CAD (coronary artery disease) 04/ 2011  (previous cardiologist-  dr h. Tamala Julian , lov 06/ 2016)  currently followed by pcp   03-03-2010  CABG x 5, LIMA to LAD, left radial artery to OM1,SVG to R1, seqSVG to PDA and PLA   ED (erectile dysfunction)    Essential hypertension, benign    GERD (gastroesophageal reflux disease)    History of pancreatitis 2005   Mixed hyperlipidemia    OSA on CPAP    S/P CABG x 5 03/03/2010   Type II or unspecified type diabetes mellitus without mention of complication, not stated as uncontrolled  endocrinologist-  dr Buddy Duty    Past Surgical History:  Procedure Laterality Date   AMPUTATION Right 08/09/2021   Procedure: AMPUTATION DIGIT;  Surgeon: Trula Slade, DPM;  Location: [facility];  Service: Podiatry;  Laterality: Right;   CARDIAC CATHETERIZATION  03-01-2010   dr Daneen Schick   nuclear stress test --positive moderate ishemia:  severe multivessel CAD w/ total occlusion of pLAD, HG obstrucion of pPDA with supplies collaterals around the apex to the LAD, severe stenosis distal RCA, total occlusion OM1, 75% D1, normal LVF    CORONARY ARTERY BYPASS  GRAFT  03-03-2010  dr hendrickson    LIMA to LAD, SVG to RI, left radial artery to OM1, seqSVG  to PDA and PLA   CYSTO W/ DILATATION , LASER INCISION OF URETHRAL STRICTURE  02-25-2005   dr Karsten Ro  [facility]   CYSTO/  DILATION URETHRAL MEATAL STENOSIS AND DILATION BULBOUS STRICTURE  12-21-2004   dr Terance Hart  [facility]   CYSTO/ RETROGRADE PYELOGRAM/ DILATATION URETHRAL STRICTURE AND PLACEMENT UROLUME STENT  08-04-2011    dr Karsten Ro  [facility]   CYSTOSCOPY WITH DIRECT VISION INTERNAL URETHROTOMY  07/15/2008   dr Karsten Ro  [facility]   and UroLume Stent placement   CYSTOSCOPY WITH URETHRAL DILATATION N/A 04/19/2018   Procedure: CYSTOSCOPY WITH BALLOON URETHRAL DILATATION;  Surgeon: Kathie Rhodes, MD;  Location: [place];  Service: Urology;  Laterality: N/A;   INCISION AND DRAINAGE Right 08/13/2021   Procedure: INCISION AND DRAINAGE RIGHT FOOT WOUND;  Surgeon: Trula Slade, DPM;  Location: [facility];  Service: Podiatry;  Laterality: Right;   ORIF TOE FRACTURE Left 05/22/2013   Procedure: OPEN REDUCTION INTERNAL FIXATION (ORIF) LEFT SECOND TOE PROXIMAL PHALANX FRACTURE/LEFT PERCUTANEOUS PINNING;  Surgeon: Wylene Simmer, MD;  Location: [place];  Service: Orthopedics;  Laterality: Left;   TONSILLECTOMY  child    Family History  Problem Relation Age of Onset   Cancer Mother        lung   Hypertension Father    CAD Father    CVA Brother       Social History   Socioeconomic History   Marital status: Married    Spouse name: Not on file   Number of children: Not on file   Years of education: Not on file   Highest education level: Not on file  Occupational History   Not on file  Tobacco Use   Smoking status: Never   Smokeless tobacco: Never  Vaping Use   Vaping Use: Never used  Substance and Sexual Activity   Alcohol use: Not Currently    Comment: occasional   Drug use: No   Sexual activity: Not on file  Other Topics Concern   Not on file  Social History Narrative   Not on file   Social  Determinants of Health   Financial Resource Strain: Not on file  Food Insecurity: Not on file  Transportation Needs: Not on file  Physical Activity: Not on file  Stress: Not on file  Social Connections: Not on file    No Known Allergies   Current Outpatient Medications:    ALPRAZolam (XANAX) 0.5 MG tablet, Take 0.5 mg by mouth 2 (two) times daily as needed for anxiety., Disp: , Rfl:    amLODipine (NORVASC) 10 MG tablet, Take 10 mg by mouth at bedtime., Disp: , Rfl:    amoxicillin-clavulanate (AUGMENTIN) 875-125 MG tablet, Take 1 tablet by mouth 2 (two) times daily., Disp: 60 tablet, Rfl: 1   aspirin 325  MG tablet, Take 325 mg by mouth daily., Disp: , Rfl:    atorvastatin (LIPITOR) 80 MG tablet, Take 80 mg by mouth every evening., Disp: , Rfl:    buPROPion (WELLBUTRIN XL) 300 MG 24 hr tablet, Take 300 mg by mouth every morning. , Disp: , Rfl:    cefTRIAXone (ROCEPHIN) IVPB, Inject 2 g into the vein daily for 21 days. Indication:  streptococcal bacteremia First Dose: No Last Day of Therapy:  09/08/21 Labs - Once weekly:  CBC/D and BMP, Labs - Every other week:  ESR and CRP Method of administration: IV Push Method of administration may be changed at the discretion of home infusion pharmacist based upon assessment of the patient and/or caregiver's ability to self-administer the medication ordered., Disp: 21 Units, Rfl: 0   fenofibrate 160 MG tablet, Take 160 mg by mouth every evening., Disp: , Rfl:    fish oil-omega-3 fatty acids 1000 MG capsule, Take 1 g by mouth daily. , Disp: , Rfl:    HUMALOG MIX 50/50 KWIKPEN (50-50) 100 UNIT/ML Kwikpen, Inject 40 Units into the skin 3 (three) times daily before meals., Disp: , Rfl:    metoprolol tartrate (LOPRESSOR) 100 MG tablet, Take 100 mg by mouth 2 (two) times daily., Disp: , Rfl:    metroNIDAZOLE (FLAGYL) 500 MG tablet, Take 1 tablet (500 mg total) by mouth every 12 (twelve) hours for 19 days., Disp: 38 tablet, Rfl: 0   NOVOFINE 32G X 6 MM MISC, ,  Disp: , Rfl:    olmesartan-hydrochlorothiazide (BENICAR HCT) 40-25 MG per tablet, Take 1 tablet by mouth every morning. , Disp: , Rfl:    omeprazole (PRILOSEC) 20 MG capsule, Take 20 mg by mouth every evening. , Disp: , Rfl:    OZEMPIC, 1 MG/DOSE, 2 MG/1.5ML SOPN, Inject 1 mg into the skin once a week. Friday, Disp: , Rfl:    polyethylene glycol (MIRALAX / GLYCOLAX) 17 g packet, Take 17 g by mouth daily as needed for moderate constipation., Disp: 14 each, Rfl: 0   potassium chloride SA (K-DUR,KLOR-CON) 20 MEQ tablet, Take 40 mEq by mouth every morning., Disp: , Rfl:    senna-docusate (SENOKOT-S) 8.6-50 MG tablet, Take 2 tablets by mouth 2 (two) times daily., Disp: , Rfl:    Vitamin D, Ergocalciferol, (DRISDOL) 1.25 MG (50000 UNIT) CAPS capsule, Take 1 capsule (50,000 Units total) by mouth every 7 (seven) days., Disp: 5 capsule, Rfl: 0   oxyCODONE (OXY IR/ROXICODONE) 5 MG immediate release tablet, Take 1 tablet (5 mg total) by mouth every 4 (four) hours as needed for moderate pain. (Patient not taking: Reported on 09/02/2021), Disp: 15 tablet, Rfl: 0   Review of Systems  Constitutional:  Negative for activity change, appetite change, chills, diaphoresis, fatigue, fever and unexpected weight change.  HENT:  Negative for congestion, rhinorrhea, sinus pressure, sneezing, sore throat and trouble swallowing.   Eyes:  Negative for photophobia and visual disturbance.  Respiratory:  Negative for cough, chest tightness, shortness of breath, wheezing and stridor.   Cardiovascular:  Positive for leg swelling. Negative for chest pain and palpitations.  Gastrointestinal:  Negative for abdominal distention, abdominal pain, anal bleeding, blood in stool, constipation, diarrhea, nausea and vomiting.  Genitourinary:  Negative for difficulty urinating, dysuria, flank pain and hematuria.  Musculoskeletal:  Positive for myalgias. Negative for arthralgias, back pain, gait problem and joint swelling.  Skin:  Positive  for wound. Negative for color change, pallor and rash.  Neurological:  Negative for dizziness, tremors, weakness and light-headedness.  Hematological:  Negative for adenopathy. Does not bruise/bleed easily.  Psychiatric/Behavioral:  Negative for agitation, behavioral problems, confusion, decreased concentration, dysphoric mood and sleep disturbance.       Objective:   Physical Exam Constitutional:      Appearance: He is well-developed.  HENT:     Head: Normocephalic and atraumatic.  Eyes:     Conjunctiva/sclera: Conjunctivae normal.  Cardiovascular:     Rate and Rhythm: Normal rate and regular rhythm.  Pulmonary:     Effort: Pulmonary effort is normal. No respiratory distress.     Breath sounds: No wheezing.  Abdominal:     General: There is no distension.     Palpations: Abdomen is soft.  Musculoskeletal:        General: Normal range of motion.     Cervical back: Normal range of motion and neck supple.     Left lower leg: Edema present.  Skin:    General: Skin is warm and dry.     Coloration: Skin is not pale.     Findings: No erythema or rash.  Neurological:     General: No focal deficit present.     Mental Status: He is alert and oriented to person, place, and time.  Psychiatric:        Mood and Affect: Mood normal.        Behavior: Behavior normal.        Thought Content: Thought content normal.        Judgment: Judgment normal.     Right operative site 09/02/2021:          PICC line:         Assessment & Plan:   Polymicrobial osteomyelitis of right foot:  status post amputation of first toe and partial amputation of second toe with Streptococcus intermedius and group B strep isolated on culture from the OR Beta lactamase + prevotella along with Streptococcus intermedius in the blood   We will have him complete his IV ceftriaxone and oral flagyl through 09/08/2021  We will then have him start oral augmentin 875/125 mg po BID and see him in  followup in November  Streptococcus intermedius bacteremia: will show 4 weeks of systemic therapy.  2D echocardiogram failed to show evidence of endocarditis.  Left lower extremity cellulitis: Resolved  Acute kidney injury: Seems resolved based on his report that his BMP was normal with PCP yesterday.  Left lower extremity edema: Has had a Baker's cyst here before but I am still apprehensive potential possibility of a DVT is aware getting a Doppler through vascular ultrasound this afternoon.   I spent 42 minutes with the patient including face to face counseling of the patient and his wife re his polymicrobial osteomyelitis, his AKI, his leg edema  personally reviewing MRI of foot from admission, updated surgical cultures, ESR, CRP BMP and CBC from health alogn with  review of medical records before and during the visit and in coordination of his care.

## 2021-09-02 NOTE — Telephone Encounter (Signed)
Thank you :)

## 2021-09-05 NOTE — Telephone Encounter (Signed)
Called and spoke with patient's wife and gave information per Cala Bradford w/ [employer] concerning the covered cost(50%).She will talk over with husband and get back with our office. She also said that [facility] is coming out right now , doesn't know if they offer PT but will call them to check.

## 2021-09-05 NOTE — Telephone Encounter (Signed)
Called [facility] and they do not offer PT but they are under same company as [facility] who has physical therapy, will fax over the order for PT, spoke with Abbi and she will let me know if unable to accept patient. Faxed notes/referral 09/05/21,received confirmation. Called and informed patient.

## 2021-09-06 NOTE — Progress Notes (Signed)
Subjective: [name] [name] is a 65 y.o. is seen today in office s/p right partial first amputation, partial second toe amputation, I&D.  He has follow-up with Dr. Daiva Eves earlier today as well.  He said increased leg swelling and is scheduled for ultrasound this afternoon as well.  His wife has been changing the bandage.  He also has not heard back from home health is for regarding physical therapy.  He currently denies any fevers or chills.   Per Dr. Clinton Gallant note status post amputation of first toe and partial amputation of second toe with Streptococcus intermedius and group B strep isolated on culture from the OR Beta lactamase + prevotella along with Streptococcus intermedius in the blood    We will have him complete his IV ceftriaxone and oral flagyl through 09/08/2021   We will then have him start oral augmentin 875/125 mg po BID and see him in followup in November  Objective: General: No acute distress, AAOx3  DP/PT pulses palpable 2/4, CRT < 3 sec to all digits.  Right foot: Incision is well coapted without any evidence of dehiscence for the amputation site.  On the central aspect there is some motion across the incision.  I removed some of the sutures and on the end of the incision without any issues.  There is no drainage or pus identified.  The wound was packed and the dorsal aspect of the foot and upon removal the wound measures 2.5 x 2 x 0.4 cm.  There is no exposed bone.  There is no drainage or pus.  Left foot: Hyperkeratotic tissue around the area the ulceration.  Upon debridement the area is preulcerative but appears the wound is almost completely healed.  No edema, erythema or signs of infection.   Swelling present to the left leg.  Assessment and Plan:  Status post right partial first ray, partial second toe amputation, I&D, doing well with no complications   -Treatment options discussed including all alternatives, risks, and complications -Scheduled for Venous today to rule  out DVT left lower extremity -Dressings were removed and the incisions were cleaned.  Irrigated the dorsal wound.  We packed the dorsal wound and Betadine was applied to the wound followed by dressing.  Continue with dressing changes.  Continue nonweightbearing, elevation.  Still trying to get home physical therapy arranged. -For the dorsal wound once I feel confident about infection likely proceed with grafting.  We will try to get PuraPly ordered for him if possible. -Elevation -Pain medication as needed. -Monitor for any clinical signs or symptoms of infection and DVT/PE and directed to call the office immediately should any occur or go to the ER. -Follow-up as scheduled or sooner if any problems arise. In the meantime, encouraged to call the office with any questions, concerns, change in symptoms.   Ovid Curd, DPM

## 2021-09-08 ENCOUNTER — Telehealth: Payer: Self-pay

## 2021-09-08 ENCOUNTER — Other Ambulatory Visit: Payer: Self-pay | Admitting: Infectious Disease

## 2021-09-08 DIAGNOSIS — M25862 Other specified joint disorders, left knee: Secondary | ICD-10-CM

## 2021-09-08 NOTE — Progress Notes (Signed)
Referral to [facility] placed in epic

## 2021-09-08 NOTE — Telephone Encounter (Signed)
Patient called requesting a referral to [facility] for cyst evaluation. Their number is [phone]  Forwarding to provider and Claris Che to assist in referral process. Timmie Dugue Loyola Mast, RN

## 2021-09-09 ENCOUNTER — Other Ambulatory Visit: Payer: Self-pay

## 2021-09-09 ENCOUNTER — Ambulatory Visit (INDEPENDENT_AMBULATORY_CARE_PROVIDER_SITE_OTHER): Payer: 59 | Admitting: Podiatry

## 2021-09-09 DIAGNOSIS — E1149 Type 2 diabetes mellitus with other diabetic neurological complication: Secondary | ICD-10-CM

## 2021-09-09 DIAGNOSIS — Z899 Acquired absence of limb, unspecified: Secondary | ICD-10-CM

## 2021-09-13 ENCOUNTER — Ambulatory Visit: Payer: Self-pay

## 2021-09-13 ENCOUNTER — Ambulatory Visit (INDEPENDENT_AMBULATORY_CARE_PROVIDER_SITE_OTHER): Payer: 59 | Admitting: Orthopaedic Surgery

## 2021-09-13 ENCOUNTER — Other Ambulatory Visit: Payer: Self-pay

## 2021-09-13 ENCOUNTER — Encounter: Payer: Self-pay | Admitting: Orthopaedic Surgery

## 2021-09-13 DIAGNOSIS — M25562 Pain in left knee: Secondary | ICD-10-CM

## 2021-09-13 DIAGNOSIS — G8929 Other chronic pain: Secondary | ICD-10-CM | POA: Diagnosis not present

## 2021-09-13 MED ORDER — BUPIVACAINE HCL 0.5 % IJ SOLN
2.0000 mL | INTRAMUSCULAR | Status: AC | PRN
  Administered 2021-09-13: 2 mL via INTRA_ARTICULAR

## 2021-09-13 MED ORDER — LIDOCAINE HCL 1 % IJ SOLN
2.0000 mL | INTRAMUSCULAR | Status: AC | PRN
  Administered 2021-09-13: 2 mL

## 2021-09-13 NOTE — Addendum Note (Signed)
Addended by: Albertina Parr on: 09/13/2021 01:04 PM   Modules accepted: Orders

## 2021-09-13 NOTE — Progress Notes (Signed)
Office Visit Note   Patient: [name] [name]           Date of Birth: [birth year]           MRN: [id number] Visit Date: 09/13/2021              Requested by: [name] [name], [name] Grinder, MD [address] [place],  [place] [address] PCP: [name] Joe, MD   Assessment & Plan: Visit Diagnoses:  1. Chronic pain of left knee     Plan: Based on findings he has 2 separate issues occurring.  For the large mass as of the left lower extremity we will obtain an MRI to further investigate this.  In terms of the left knee he does have advanced DJD but also recently had history of bacteremia.  I was able to aspirate about 5 cc of murky blood-tinged fluid which we will send for cell count and cultures.  Cortisone injection was not performed today.  We will be in touch about the cell count of the fluid.  Follow-up after the MRI.  Follow-Up Instructions: No follow-ups on file.   Orders:  Orders Placed This Encounter  Procedures   Large Joint Inj   XR KNEE 3 VIEW LEFT   MR TIBIA FIBULA LEFT W WO CONTRAST   No orders of the defined types were placed in this encounter.     Procedures: Large Joint Inj: L knee on 09/13/2021 12:03 PM Details: 22 G needle Medications: 2 mL bupivacaine 0.5 %; 2 mL lidocaine 1 % Outcome: tolerated well, no immediate complications Patient was prepped and draped in the usual sterile fashion.      Clinical Data: No additional findings.   Subjective: Chief Complaint  Patient presents with   Left Knee - Pain    [name] [name] is a 65 year old gentleman referral from [name] [name] for evaluation of left knee pain and cyst and swelling of the left lower extremity.  Patient recently underwent toe amputation by [name] [name] for osteomyelitis.  He is a well-controlled diabetic.  He has had pain to his knee for about a year without any injuries.  He is noticed swollen area to the left calf for about 3 months.  Denies any fevers or chills.  Recently had ultrasound that was negative  for DVT but showed to popliteal cysts.  Currently denies any constitutional symptoms.   Review of Systems  Constitutional: Negative.   All other systems reviewed and are negative.   Objective: Vital Signs: There were no vitals taken for this visit.  Physical Exam Vitals and nursing note reviewed.  Constitutional:      Appearance: He is well-developed.  Pulmonary:     Effort: Pulmonary effort is normal.  Abdominal:     Palpations: Abdomen is soft.  Skin:    General: Skin is warm.  Neurological:     Mental Status: He is alert and oriented to person, place, and time.  Psychiatric:        Behavior: Behavior normal.        Thought Content: Thought content normal.        Judgment: Judgment normal.    Ortho Exam  Left lower extremity shows small joint effusion of the knee.  Painful range of motion.  Collaterals and cruciates are stable.  Is a large swollen mass to the medial calf that feels fluctuant.  No evidence of cellulitis or neurovascular compromise.  Specialty Comments:  No specialty comments available.  Imaging: XR KNEE 3 VIEW  LEFT  Result Date: 09/13/2021 Significant degenerative changes to the left knee with bone-on-bone joint space narrowing of the medial compartment.    PMFS History: Patient Active Problem List   Diagnosis Date Noted   Hyponatremia    AKI (acute kidney injury) (HCC)    Elevated LFTs    Post-operative state    Cellulitis of left lower extremity    Streptococcal bacteremia    Cellulitis of right lower extremity    Osteomyelitis of great toe of right foot (HCC)    Osteomyelitis of second toe of right foot (HCC)    Diabetes mellitus due to underlying condition with diabetic autonomic neuropathy, without long-term current use of insulin (HCC)    Class 3 severe obesity with serious comorbidity and body mass index (BMI) of 45.0 to 49.9 in adult [name] [name])    Toe infection 08/08/2021   CAD (coronary artery disease) of artery bypass graft 04/21/2015    Hyperlipidemia 04/21/2015   Essential hypertension 04/21/2015   Type 2 diabetes mellitus (HCC) 04/21/2015   Ulcer of foot (HCC) 08/22/2014   Past Medical History:  Diagnosis Date   Anxiety    Bulbous urethral stricture    chronic--- hx multiple dilatation's and urolume stent's   CAD (coronary artery disease) 04/ 2011  (previous cardiologist-  dr h. Katrinka [name] , lov 06/ 2016)  currently followed by pcp   03-03-2010  CABG x 5, LIMA to LAD, left radial artery to OM1,SVG to R1, seqSVG to PDA and PLA   ED (erectile dysfunction)    Essential hypertension, benign    GERD (gastroesophageal reflux disease)    History of pancreatitis 2005   Mixed hyperlipidemia    OSA on CPAP    S/P CABG x 5 03/03/2010   Type II or unspecified type diabetes mellitus without mention of complication, not stated as uncontrolled    endocrinologist-  dr [name] [name]    Family History  Problem Relation Age of Onset   Cancer Mother        lung   Hypertension Father    CAD Father    CVA Brother     Past Surgical History:  Procedure Laterality Date   AMPUTATION Right 08/09/2021   Procedure: AMPUTATION DIGIT;  Surgeon: [name] [name], DPM;  Location: [facility];  Service: Podiatry;  Laterality: Right;   CARDIAC CATHETERIZATION  03-01-2010   dr Verdis Prime   nuclear stress test --positive moderate ishemia:  severe multivessel CAD w/ total occlusion of pLAD, HG obstrucion of pPDA with supplies collaterals around the apex to the LAD, severe stenosis distal RCA, total occlusion OM1, 75% D1, normal LVF    CORONARY ARTERY BYPASS GRAFT  03-03-2010  dr hendrickson    LIMA to LAD, SVG to RI, left radial artery to OM1, seqSVG  to PDA and PLA   CYSTO W/ DILATATION , LASER INCISION OF URETHRAL STRICTURE  02-25-2005   dr Vernie Ammons  [facility]   CYSTO/  DILATION URETHRAL MEATAL STENOSIS AND DILATION BULBOUS STRICTURE  12-21-2004   dr Vonita Moss  [name]   CYSTO/ RETROGRADE PYELOGRAM/ DILATATION URETHRAL STRICTURE AND PLACEMENT UROLUME STENT   08-04-2011    dr Vernie Ammons  [facility]   CYSTOSCOPY WITH DIRECT VISION INTERNAL URETHROTOMY  07/15/2008   dr Vernie Ammons  [name]   and UroLume Stent placement   CYSTOSCOPY WITH URETHRAL DILATATION N/A 04/19/2018   Procedure: CYSTOSCOPY WITH BALLOON URETHRAL DILATATION;  Surgeon: Ihor Gully, MD;  Location: [facility];  Service: Urology;  Laterality: N/A;   INCISION  AND DRAINAGE Right 08/13/2021   Procedure: INCISION AND DRAINAGE RIGHT FOOT WOUND;  Surgeon: [name] [name], DPM;  Location: [facility];  Service: Podiatry;  Laterality: Right;   ORIF TOE FRACTURE Left 05/22/2013   Procedure: OPEN REDUCTION INTERNAL FIXATION (ORIF) LEFT SECOND TOE PROXIMAL PHALANX FRACTURE/LEFT PERCUTANEOUS PINNING;  Surgeon: Toni Arthurs, MD;  Location: [facility];  Service: Orthopedics;  Laterality: Left;   TONSILLECTOMY  child   Social History   Occupational History   Not on file  Tobacco Use   Smoking status: Never   Smokeless tobacco: Never  Vaping Use   Vaping Use: Never used  Substance and Sexual Activity   Alcohol use: Not Currently    Comment: occasional   Drug use: No   Sexual activity: Not on file

## 2021-09-14 ENCOUNTER — Telehealth: Payer: Self-pay | Admitting: *Deleted

## 2021-09-14 NOTE — Telephone Encounter (Signed)
Patient was denied by [facility] because he is still active w/ [facility] for infusion services. Unable to bill his insurance while he is still receiving services through them. Called [facility] and they said that they are out of network for patient.  Called [facility](formerly [facility])they will review and call us back. Faxed to [phone],[id number] confirmation. Called and spoke with patient and he said that he is no longer a patient w/ [facility], called them and they confirmed but will not be able to deactivate him until next week but [facility] Will refax the order next week.

## 2021-09-14 NOTE — Progress Notes (Signed)
Subjective: [name] [name] is a 65 y.o. is seen today in office s/p right partial first amputation, partial second toe amputation, I&D.  He denies any other acute complaints.  He is known to Dr. Ardelle Anton.  He had his ultrasound for DVT done.  This was negative.  He denies any other acute complaint  Per Dr. Clinton Gallant note status post amputation of first toe and partial amputation of second toe with Streptococcus intermedius and group B strep isolated on culture from the OR Beta lactamase + prevotella along with Streptococcus intermedius in the blood    We will have him complete his IV ceftriaxone and oral flagyl through 09/08/2021   We will then have him start oral augmentin 875/125 mg po BID and see him in followup in November  Objective: General: No acute distress, AAOx3  DP/PT pulses palpable 2/4, CRT < 3 sec to all digits.  Right foot: Incision is well coapted without any evidence of dehiscence for the amputation site.  On the central aspect there is some motion across the incision.  I removed some of the sutures and on the end of the incision without any issues.  There is no drainage or pus identified.  The wound was packed and the dorsal aspect of the foot and upon removal the wound measures 2.5 x 2 x 0.4 cm.  There is no exposed bone.  There is no drainage or pus.  Left foot: Hyperkeratotic tissue around the area the ulceration.  Upon debridement the area is preulcerative but appears the wound is almost completely healed.  No edema, erythema or signs of infection.   Swelling has improved in the left leg  Assessment and Plan:  Status post right partial first ray, partial second toe amputation, I&D, doing well with no complications   -Treatment options discussed including all alternatives, risks, and complications -No DVT was found in left lower extremity -Dressings were removed and the incisions were cleaned.  Irrigated the dorsal wound.  We packed the dorsal wound and Betadine was applied  to the wound followed by dressing.  Continue with dressing changes.  Continue nonweightbearing, elevation.  We will continue to attempt to get physical therapy arranged -The wound appears more granular at today's visit.  He may be ready for grafting.  Patient will follow with Dr. Ardelle Anton for further management for grafting. -Elevation -Pain medication as needed. -Monitor for any clinical signs or symptoms of infection and DVT/PE and directed to call the office immediately should any occur or go to the ER. -Follow-up as scheduled or sooner if any problems arise. In the meantime, encouraged to call the office with any questions, concerns, change in symptoms.   Ovid Curd, DPM

## 2021-09-15 ENCOUNTER — Other Ambulatory Visit: Payer: 59

## 2021-09-16 ENCOUNTER — Other Ambulatory Visit: Payer: Self-pay

## 2021-09-16 ENCOUNTER — Ambulatory Visit (INDEPENDENT_AMBULATORY_CARE_PROVIDER_SITE_OTHER): Payer: 59 | Admitting: Podiatry

## 2021-09-16 DIAGNOSIS — L97512 Non-pressure chronic ulcer of other part of right foot with fat layer exposed: Secondary | ICD-10-CM

## 2021-09-16 DIAGNOSIS — L97521 Non-pressure chronic ulcer of other part of left foot limited to breakdown of skin: Secondary | ICD-10-CM

## 2021-09-16 DIAGNOSIS — Z899 Acquired absence of limb, unspecified: Secondary | ICD-10-CM

## 2021-09-16 DIAGNOSIS — E1149 Type 2 diabetes mellitus with other diabetic neurological complication: Secondary | ICD-10-CM

## 2021-09-17 ENCOUNTER — Ambulatory Visit
Admission: RE | Admit: 2021-09-17 | Discharge: 2021-09-17 | Disposition: A | Discharge status: Home / Self Care | Payer: 59 | Source: Ambulatory Visit | Attending: Orthopaedic Surgery | Admitting: Orthopaedic Surgery

## 2021-09-17 DIAGNOSIS — G8929 Other chronic pain: Secondary | ICD-10-CM

## 2021-09-17 DIAGNOSIS — M25562 Pain in left knee: Secondary | ICD-10-CM

## 2021-09-17 IMAGING — MR MR [PERSON_NAME] LOW WO/W CM*L*
4 of 8 series · 20 of 40 positions shown · IV contrast (20 mlMultihance)
Comparison: None.

CLINICAL DATA: Left knee pain and swelling.  Ongoing for 3 weeks.

EXAM:
MRI OF LOWER LEFT EXTREMITY WITHOUT AND WITH CONTRAST
TECHNIQUE: Multiplanar, multisequence MR imaging of the left lower leg was
performed both before and after administration of intravenous
contrast.
CONTRAST:  20mL MULTIHANCE GADOBENATE DIMEGLUMINE 529 MG/ML IV SOLN

[Series 3: T1 · coronal · 4.0mm · 0.52mm/px · 5 of 33 slices shown (1 of 2)]
[im 1/33]
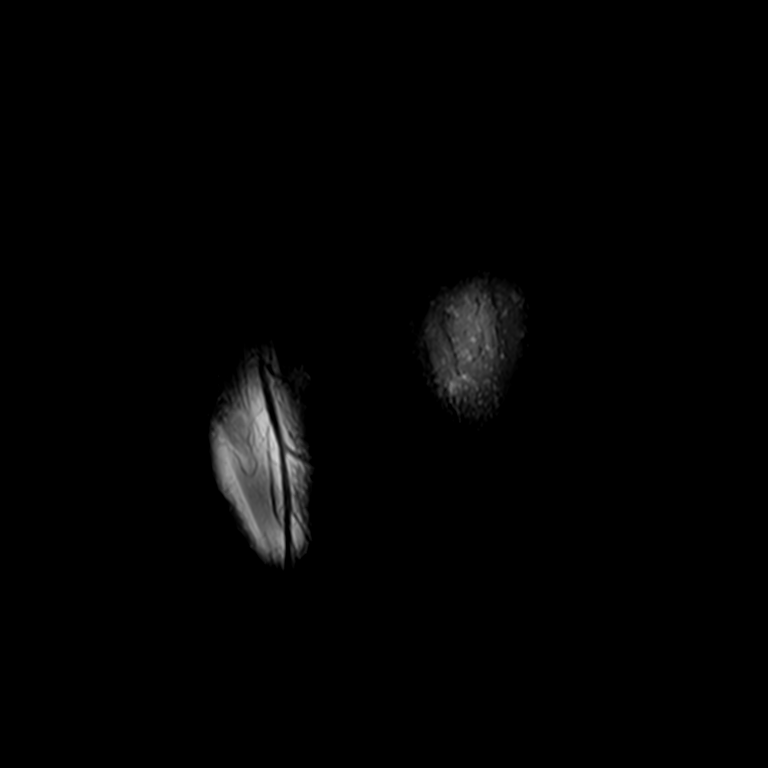
[im 9/33]
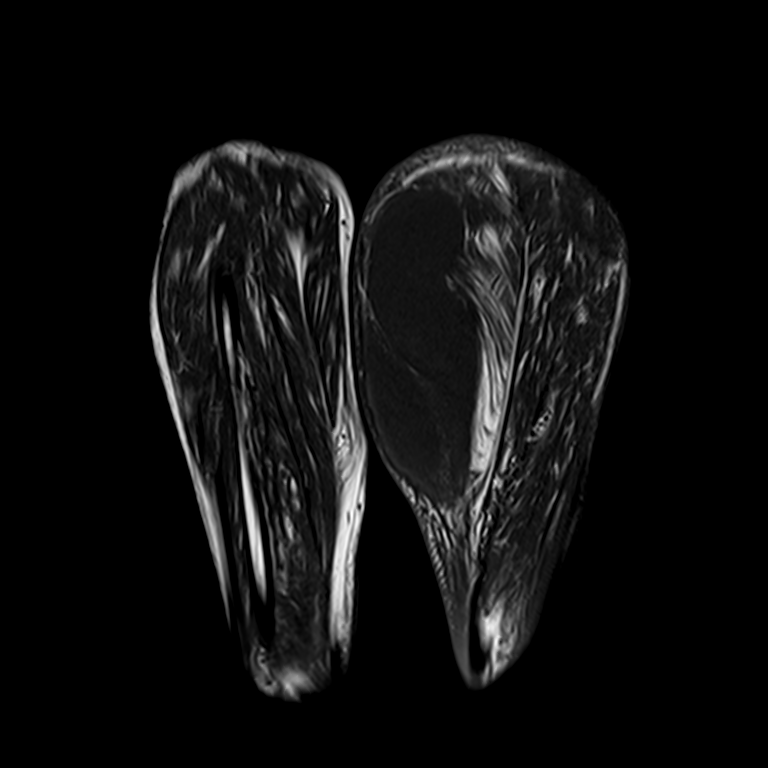
[im 17/33]
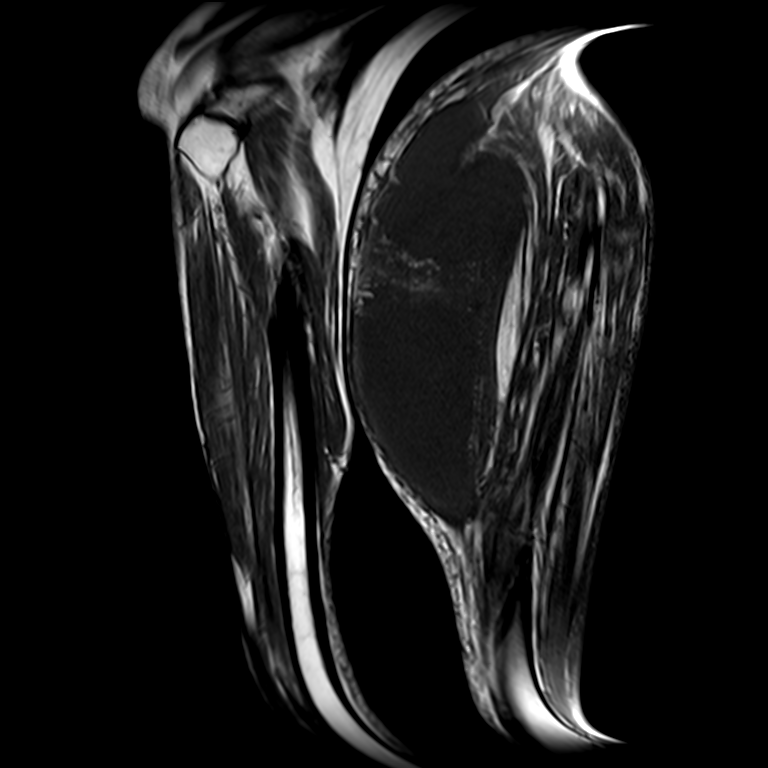
[im 25/33]
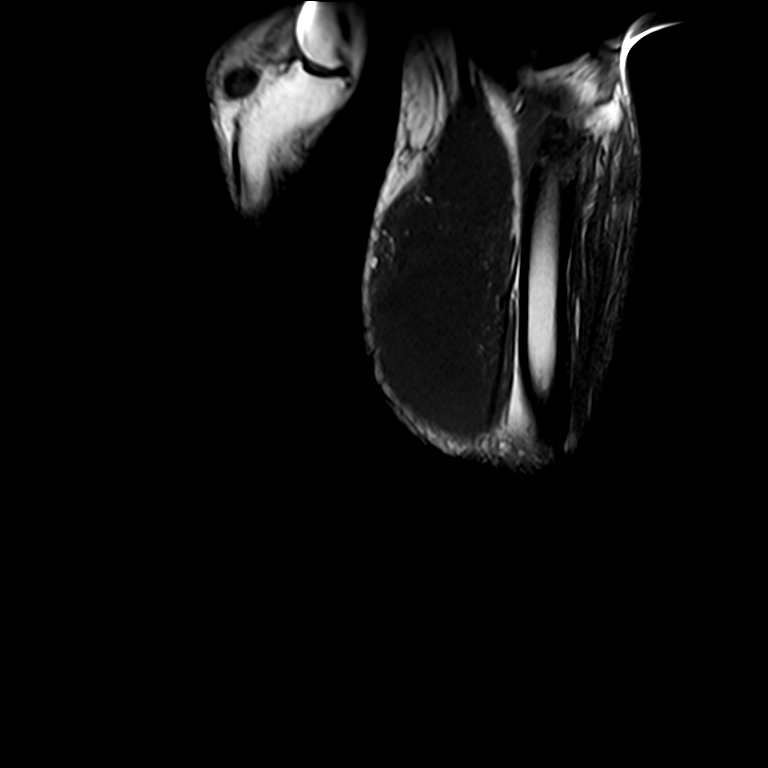
[im 33/33]
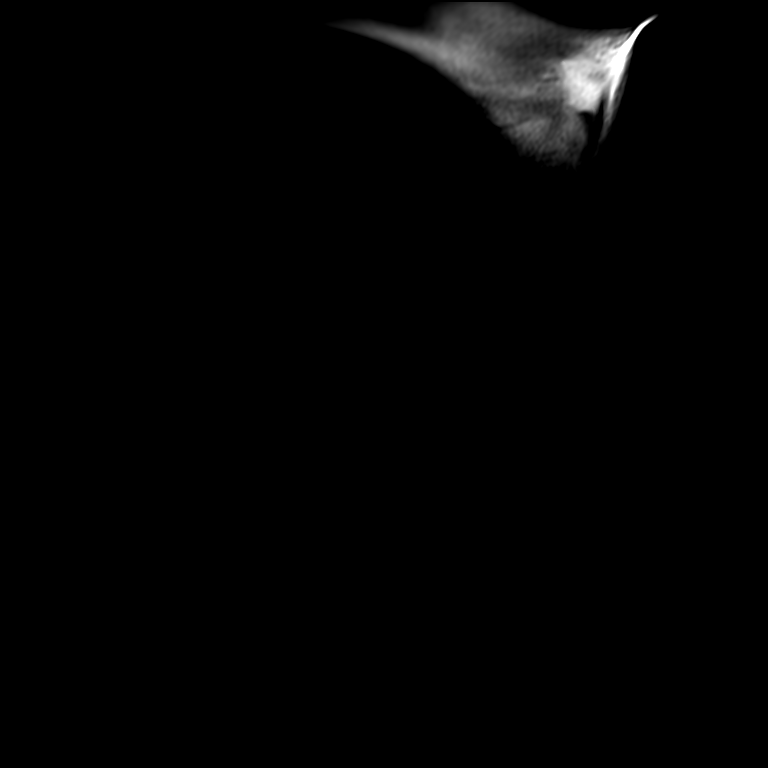

[Series 5: T1 · axial · 5.0mm · 0.59mm/px · z∈[-96,+180]mm · 6 of 47 slices shown (2 of 2)]
[im 1/47]
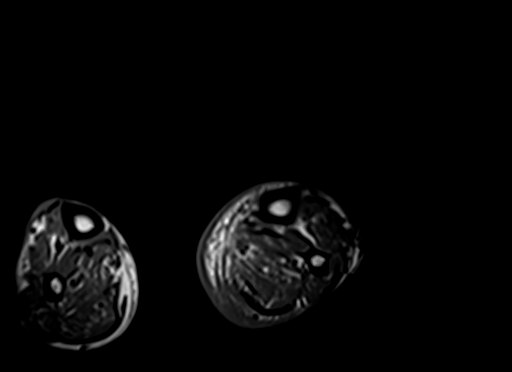
[im 10/47]
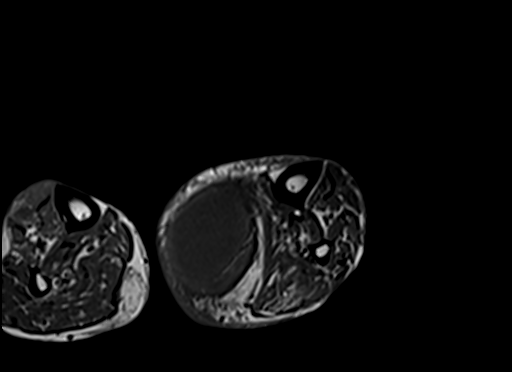
[im 19/47]
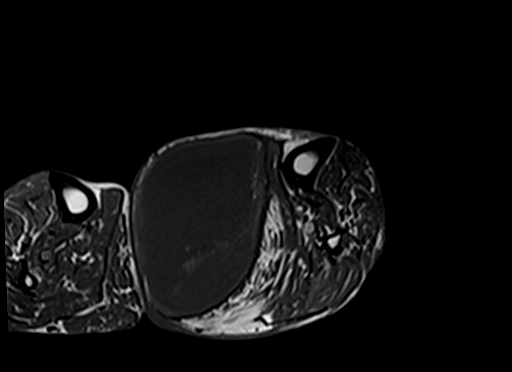
[im 28/47]
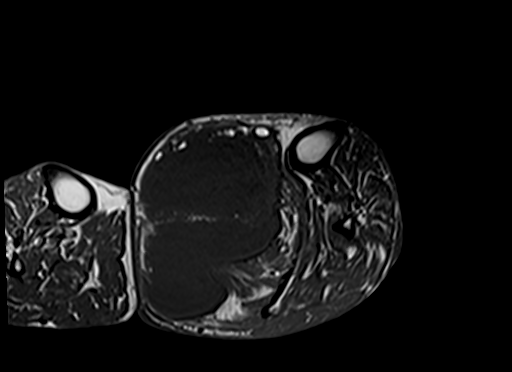
[im 37/47]
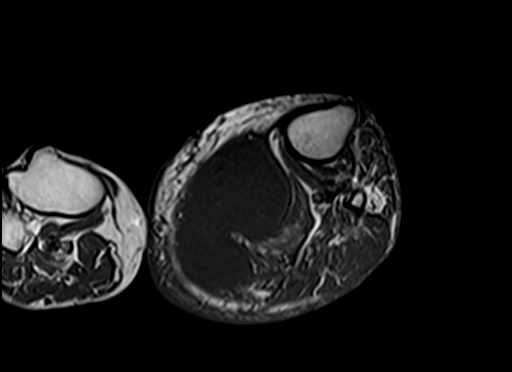
[im 47/47]
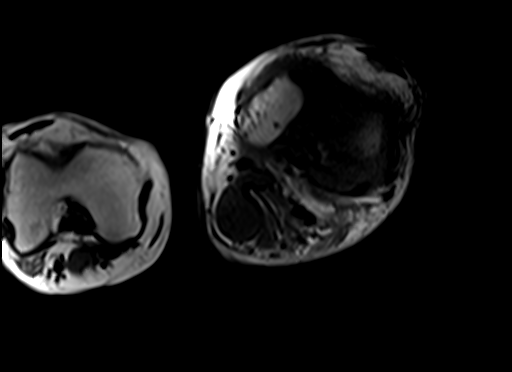

[Series 7: T2 fat-sat · axial · 5.5mm · 0.94mm/px · z∈[-85,+169]mm · 6 of 43 slices shown]
[im 1/43]
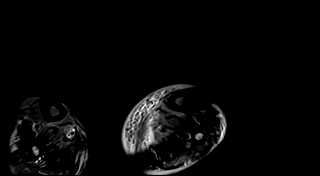
[im 9/43]
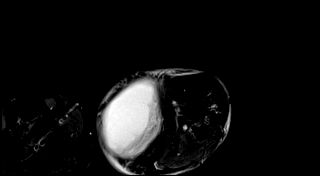
[im 17/43]
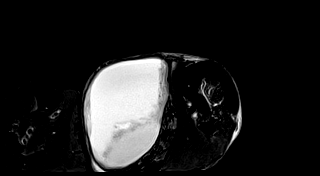
[im 26/43]
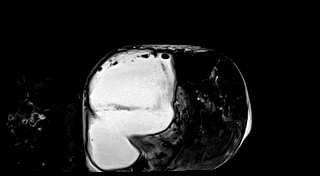
[im 34/43]
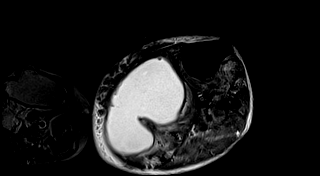
[im 43/43]
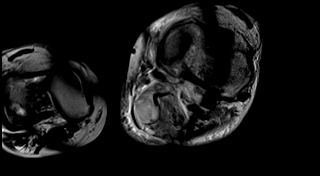

[Series 9: T1 fat-sat · axial · 5.5mm · 0.62mm/px · z∈[-32,+185]mm · 3 of 42 slices shown]
[im 9/42]
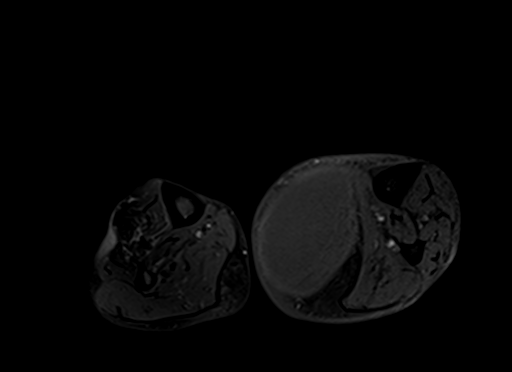
[im 25/42]
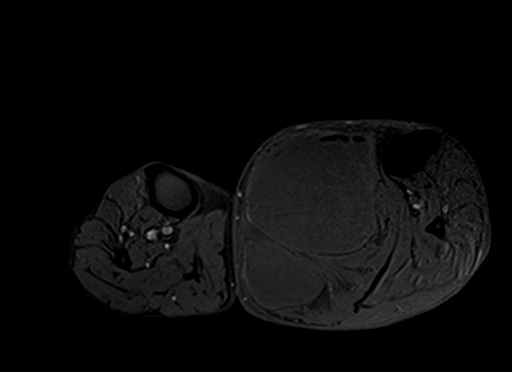
[im 42/42]
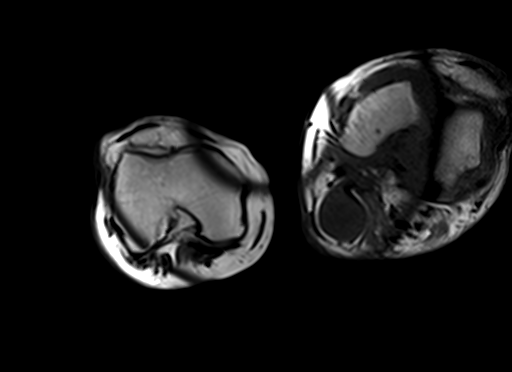

[20 of 40 positions shown; findings below may reference images not displayed]

FINDINGS: Bones/Joint/Cartilage

No fracture or dislocation. Normal alignment. Partially visualized
osteoarthritis of the medial and lateral femorotibial compartments.
Large fluid collection along the medial aspect of the calf measuring
10.4 x 7.5 x 20.2 cm with a few thin septations which appears to
originate between the semimembranosus and medial head of the
gastrocnemius tendon concerning for a large Baker's cyst with thin
peripheral enhancement. Mass effect on the medial gastrocnemius
muscle.

Muscles and Tendons

Muscles are otherwise normal.

Soft tissue
No other fluid collection or hematoma.  No soft tissue mass.
IMPRESSION: 1. Partially visualized osteoarthritis of the left medial and
lateral femorotibial compartments. Large fluid collection along the
medial aspect of the calf measuring 10.4 x 7.5 x 20.2 cm with a few
thin septations which appears to originate between the
semimembranosus and medial head of the gastrocnemius tendon most
consistent with a large Baker's cyst.

## 2021-09-17 MED ORDER — GADOBENATE DIMEGLUMINE 529 MG/ML IV SOLN
20.0000 mL | Freq: Once | INTRAVENOUS | Status: AC | PRN
  Administered 2021-09-17: 20 mL via INTRAVENOUS

## 2021-09-18 NOTE — Progress Notes (Signed)
Needs appt.  Also had knee aspiration but I don't see results in epic.  Thanks.

## 2021-09-18 NOTE — Progress Notes (Signed)
Subjective: [name] [name] is a 65 y.o. is seen today in office s/p right partial first amputation, partial second toe amputation, I&D.  His wife has been changing the bandage.  No increase in swelling or redness.  No purulence.  He still had swelling to the left leg and follow-up orthopedics and MRIs been ordered which he is still waiting for.  Currently denies any fevers or chills.  No other concerns.  Per Dr. Clinton [name] note status post amputation of first toe and partial amputation of second toe with Streptococcus intermedius and group B strep isolated on culture from the OR Beta lactamase + prevotella along with Streptococcus intermedius in the blood    We will have him complete his IV ceftriaxone and oral flagyl through 09/08/2021   We will then have him start oral augmentin 875/125 mg po BID and see him in followup in November  Objective: General: No acute distress, AAOx3  DP/PT pulses palpable 2/4, CRT < 3 sec to all digits.  Right foot: Incision is well coapted except on the central aspect of the amputation site superficial granular wound is present with superficial dehiscence without any probing, undermining or tunneling.  Wound present on the dorsal aspect the foot incision and drainage site and is no longer being packed it is more superficial.  There is no probing, undermining or tunneling.  No exposed bone or tendon to either wound.  There is no fluctuation crepitation.  Minimal swelling to the foot there is no erythema or warmth. Left foot: Hyperkeratotic tissue with superficial granular wound present in central aspect.  There is no probing to bone or joint.  No surrounding erythema, ascending cellulitis.  No fluctuation crepitation.  No malodor.    Assessment and Plan:  Status post right partial first ray, partial second toe amputation, I&D, doing well with no complications   -Treatment options discussed including all alternatives, risks, and complications -Dressings were changed  today.  Appears that the wounds are healing well and no signs of infection.  I did get a graft of blue, Apligraf however would hold off on this for now as he is doing better.  Continue daily dressing change, elevation.  Limit weightbearing.  Finished course of antibiotics per infectious disease.  Call orthopedics for left leg swelling and for MRI.  Return in about 1 week (around 09/23/2021) for wound check .  Ovid Curd, DPM

## 2021-09-19 ENCOUNTER — Telehealth: Payer: Self-pay | Admitting: Orthopaedic Surgery

## 2021-09-19 LAB — ANAEROBIC AND AEROBIC CULTURE
AER RESULT:: NO GROWTH
MICRO NUMBER:: 12548110
MICRO NUMBER:: 12548111
SPECIMEN QUALITY:: ADEQUATE
SPECIMEN QUALITY:: ADEQUATE

## 2021-09-19 LAB — SYNOVIAL FLUID ANALYSIS, COMPLETE
Basophils, %: 0 %
Eosinophils-Synovial: 0 % (ref 0–2)
Lymphocytes-Synovial Fld: 3 % (ref 0–74)
Monocyte/Macrophage: 8 % (ref 0–69)
Neutrophil, Synovial: 89 % — ABNORMAL HIGH (ref 0–24)
Synoviocytes, %: 0 % (ref 0–15)
WBC, Synovial: 6263 cells/uL — ABNORMAL HIGH (ref <150)

## 2021-09-19 NOTE — Progress Notes (Signed)
Appt made. Results on your desk.

## 2021-09-19 NOTE — Telephone Encounter (Signed)
Called pt 1X and left vm for pt to call and set an appt with Dr. Roda Shutters after 10/29 for MRI Review

## 2021-09-20 ENCOUNTER — Other Ambulatory Visit: Payer: Self-pay

## 2021-09-20 ENCOUNTER — Ambulatory Visit (INDEPENDENT_AMBULATORY_CARE_PROVIDER_SITE_OTHER): Payer: 59 | Admitting: Orthopaedic Surgery

## 2021-09-20 ENCOUNTER — Other Ambulatory Visit: Payer: Self-pay | Admitting: Orthopaedic Surgery

## 2021-09-20 ENCOUNTER — Encounter: Payer: Self-pay | Admitting: Orthopaedic Surgery

## 2021-09-20 DIAGNOSIS — M7989 Other specified soft tissue disorders: Secondary | ICD-10-CM | POA: Diagnosis not present

## 2021-09-20 NOTE — Progress Notes (Signed)
Office Visit Note   Patient: [name] [name]           Date of Birth: [birth year]           MRN: [id number] Visit Date: 09/20/2021              Requested by: Tally Joe, MD [phone] [place],  [place] [address] PCP: Tally Joe, MD   Assessment & Plan: Visit Diagnoses:  1. Left leg swelling     Plan: MRI findings are consistent with a large Baker's cyst extending down into the calf.  There is no evidence or concern for infection.  These findings were reviewed with [name] [name] and his wife in detail and given the size of the cyst I have recommended ultrasound-guided aspiration by interventional radiology.  Questions encouraged and answered.  Follow-up as needed.  Follow-Up Instructions: No follow-ups on file.   Orders:  Orders Placed This Encounter  Procedures   Ambulatory referral to Interventional Radiology   No orders of the defined types were placed in this encounter.     Procedures: No procedures performed   Clinical Data: No additional findings.   Subjective: Chief Complaint  Patient presents with   Left Leg - Follow-up    HPI  [name] [name] returns today to discuss recent left leg MRI.  Review of Systems   Objective: Vital Signs: There were no vitals taken for this visit.  Physical Exam  Ortho Exam  Examination of the left lower leg is unchanged.  Specialty Comments:  No specialty comments available.  Imaging: No results found.   PMFS History: Patient Active Problem List   Diagnosis Date Noted   Hyponatremia    AKI (acute kidney injury) (HCC)    Elevated LFTs    Post-operative state    Cellulitis of left lower extremity    Streptococcal bacteremia    Cellulitis of right lower extremity    Osteomyelitis of great toe of right foot (HCC)    Osteomyelitis of second toe of right foot (HCC)    Diabetes mellitus due to underlying condition with diabetic autonomic neuropathy, without long-term current use of insulin (HCC)     Class 3 severe obesity with serious comorbidity and body mass index (BMI) of 45.0 to 49.9 in adult [facility])    Toe infection 08/08/2021   CAD (coronary artery disease) of artery bypass graft 04/21/2015   Hyperlipidemia 04/21/2015   Essential hypertension 04/21/2015   Type 2 diabetes mellitus (HCC) 04/21/2015   Ulcer of foot (HCC) 08/22/2014   Past Medical History:  Diagnosis Date   Anxiety    Bulbous urethral stricture    chronic--- hx multiple dilatation's and urolume stent's   CAD (coronary artery disease) 04/ 2011  (previous cardiologist-  dr h. Katrinka Blazing , lov 06/ 2016)  currently followed by pcp   03-03-2010  CABG x 5, LIMA to LAD, left radial artery to OM1,SVG to R1, seqSVG to PDA and PLA   ED (erectile dysfunction)    Essential hypertension, benign    GERD (gastroesophageal reflux disease)    History of pancreatitis 2005   Mixed hyperlipidemia    OSA on CPAP    S/P CABG x 5 03/03/2010   Type II or unspecified type diabetes mellitus without mention of complication, not stated as uncontrolled    endocrinologist-  dr Sharl Ma    Family History  Problem Relation Age of Onset   Cancer Mother        lung  Hypertension Father    CAD Father    CVA Brother     Past Surgical History:  Procedure Laterality Date   AMPUTATION Right 08/09/2021   Procedure: AMPUTATION DIGIT;  Surgeon: Vivi Barrack, DPM;  Location: [facility];  Service: Podiatry;  Laterality: Right;   CARDIAC CATHETERIZATION  03-01-2010   dr Verdis Prime   nuclear stress test --positive moderate ishemia:  severe multivessel CAD w/ total occlusion of pLAD, HG obstrucion of pPDA with supplies collaterals around the apex to the LAD, severe stenosis distal RCA, total occlusion OM1, 75% D1, normal LVF    CORONARY ARTERY BYPASS GRAFT  03-03-2010  dr hendrickson    LIMA to LAD, SVG to RI, left radial artery to OM1, seqSVG  to PDA and PLA   CYSTO W/ DILATATION , LASER INCISION OF URETHRAL STRICTURE  02-25-2005   dr Vernie Ammons  [facility]    CYSTO/  DILATION URETHRAL MEATAL STENOSIS AND DILATION BULBOUS STRICTURE  12-21-2004   dr Vonita Moss  [facility]   CYSTO/ RETROGRADE PYELOGRAM/ DILATATION URETHRAL STRICTURE AND PLACEMENT UROLUME STENT  08-04-2011    dr Vernie Ammons  [facility]   CYSTOSCOPY WITH DIRECT VISION INTERNAL URETHROTOMY  07/15/2008   dr Vernie Ammons  [facility]   and UroLume Stent placement   CYSTOSCOPY WITH URETHRAL DILATATION N/A 04/19/2018   Procedure: CYSTOSCOPY WITH BALLOON URETHRAL DILATATION;  Surgeon: Ihor Gully, MD;  Location: [facility];  Service: Urology;  Laterality: N/A;   INCISION AND DRAINAGE Right 08/13/2021   Procedure: INCISION AND DRAINAGE RIGHT FOOT WOUND;  Surgeon: Vivi Barrack, DPM;  Location: [facility];  Service: Podiatry;  Laterality: Right;   ORIF TOE FRACTURE Left 05/22/2013   Procedure: OPEN REDUCTION INTERNAL FIXATION (ORIF) LEFT SECOND TOE PROXIMAL PHALANX FRACTURE/LEFT PERCUTANEOUS PINNING;  Surgeon: Toni Arthurs, MD;  Location: [facility];  Service: Orthopedics;  Laterality: Left;   TONSILLECTOMY  child   Social History   Occupational History   Not on file  Tobacco Use   Smoking status: Never   Smokeless tobacco: Never  Vaping Use   Vaping Use: Never used  Substance and Sexual Activity   Alcohol use: Not Currently    Comment: occasional   Drug use: No   Sexual activity: Not on file

## 2021-09-21 ENCOUNTER — Telehealth: Payer: Self-pay | Admitting: Orthopaedic Surgery

## 2021-09-21 NOTE — Telephone Encounter (Signed)
I think i've signed it.  Let me know if they don't see it

## 2021-09-21 NOTE — Telephone Encounter (Signed)
Order needs to be signed because it was changed to ultrasound guided for aspiration.

## 2021-09-21 NOTE — Telephone Encounter (Signed)
Vicky from [employer] called requesting Marisue Ivan have Dr. Roda Shutters sign an epic order sent. Please have doctor sign order.

## 2021-09-22 ENCOUNTER — Ambulatory Visit
Admission: RE | Admit: 2021-09-22 | Discharge: 2021-09-22 | Disposition: A | Discharge status: Home / Self Care | Payer: 59 | Source: Ambulatory Visit | Attending: Orthopaedic Surgery | Admitting: Orthopaedic Surgery

## 2021-09-22 DIAGNOSIS — M7989 Other specified soft tissue disorders: Secondary | ICD-10-CM

## 2021-09-22 IMAGING — US US GUIDANCE NEEDLE PLACEMENT
1 series · 6 of 6 positions shown · non-contrast
Comparison: none

INDICATION: 65-year-old male with a history of large left Baker's cyst, referred
for aspiration

[Series 1: us guidance needle placement · 0.26mm/px · 6 acquisitions, 6 frames shown]
[im 1/6]
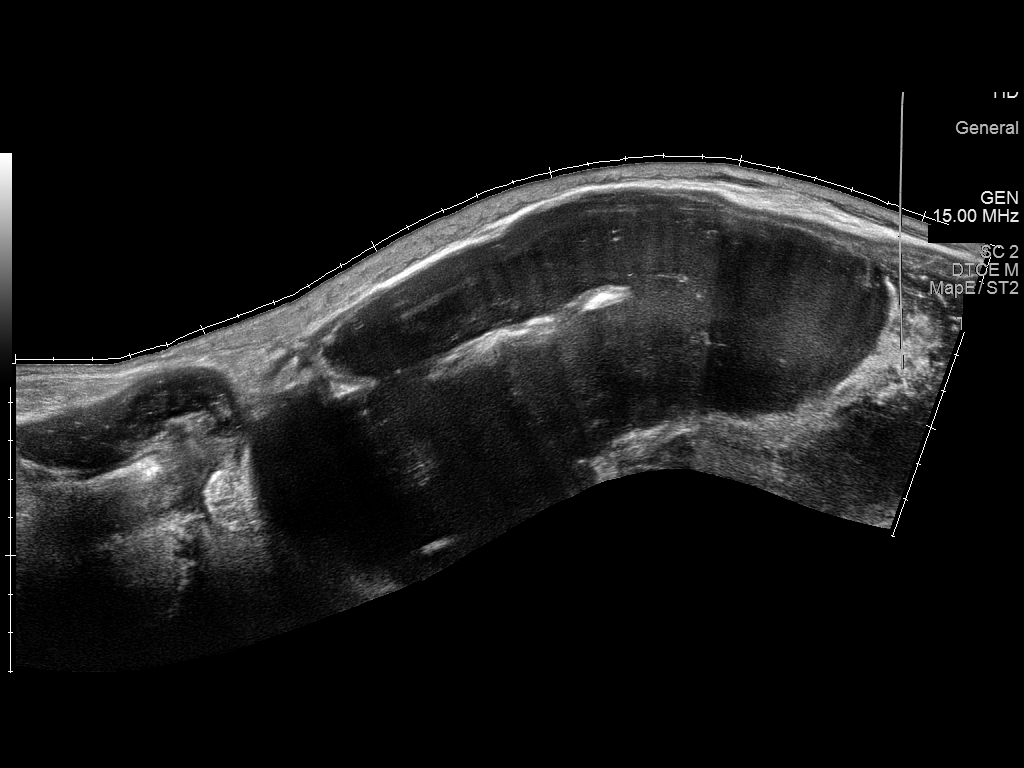
[im 2/6]
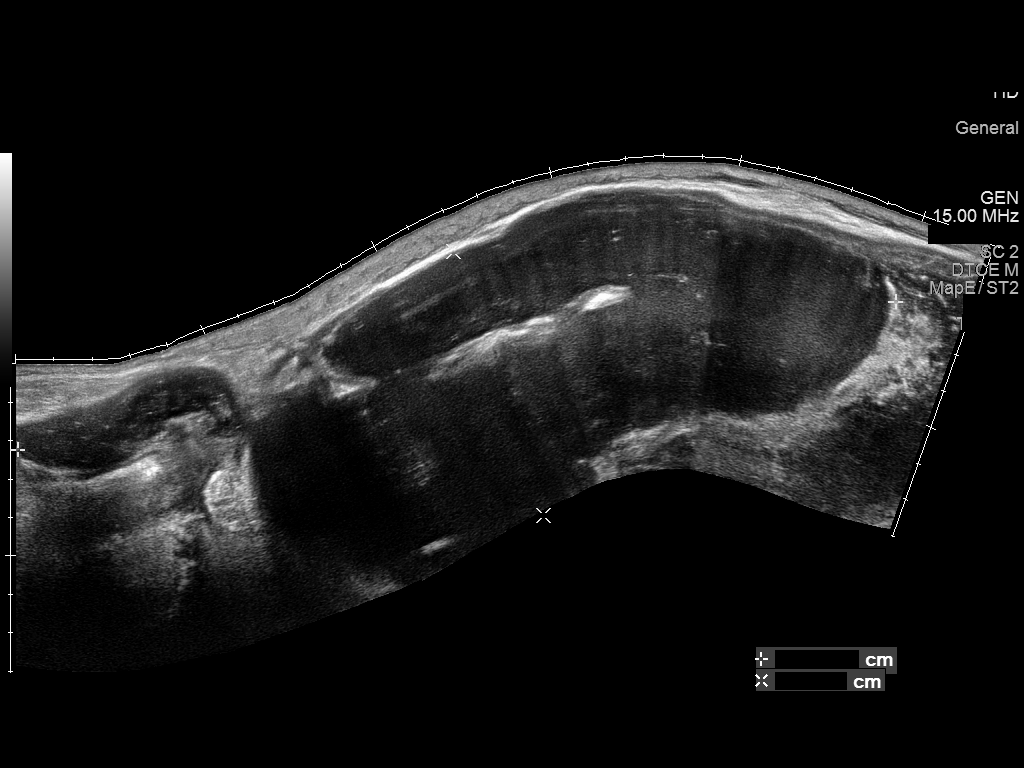
[im 3/6]
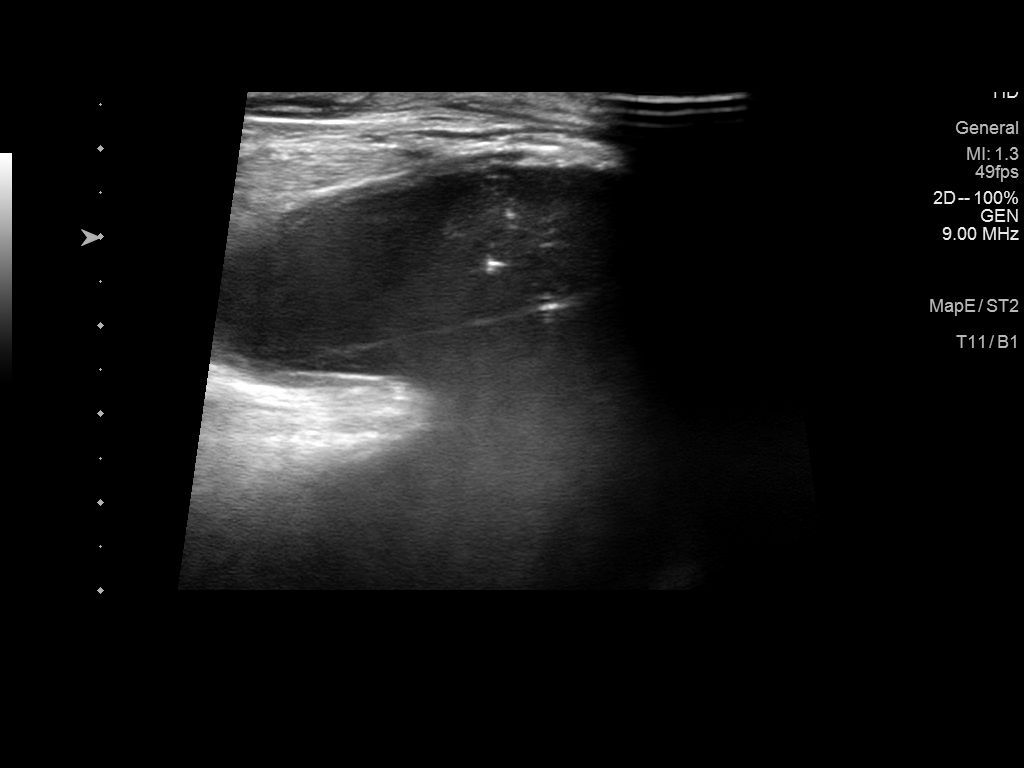
[im 4/6]
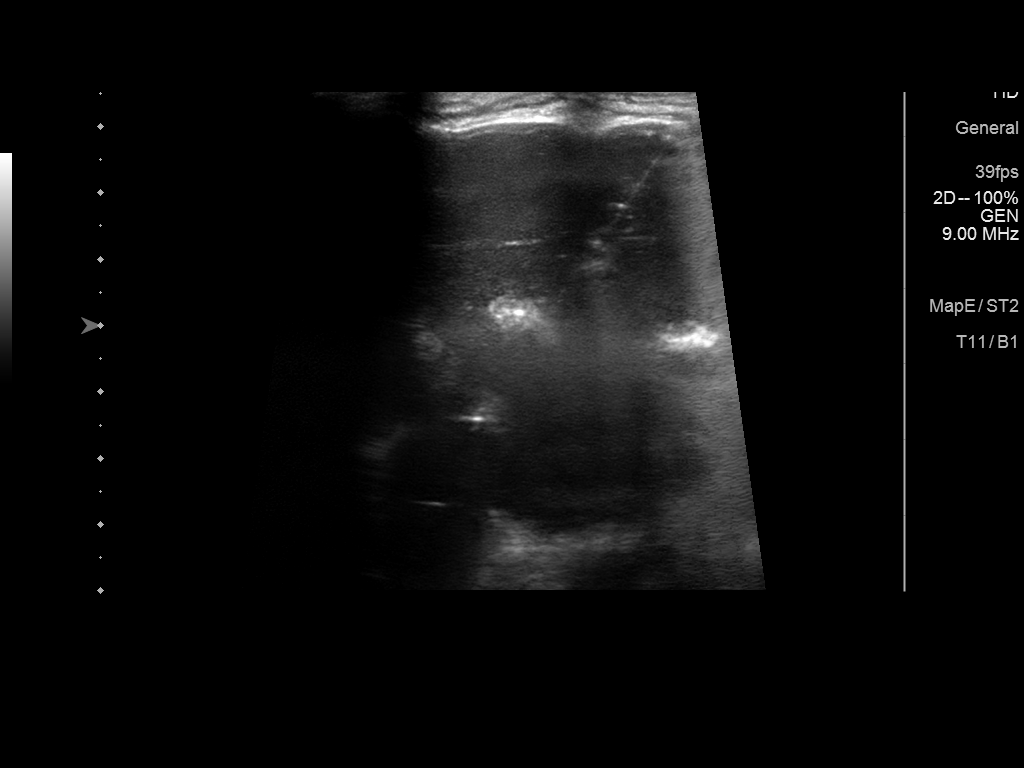
[im 5/6]
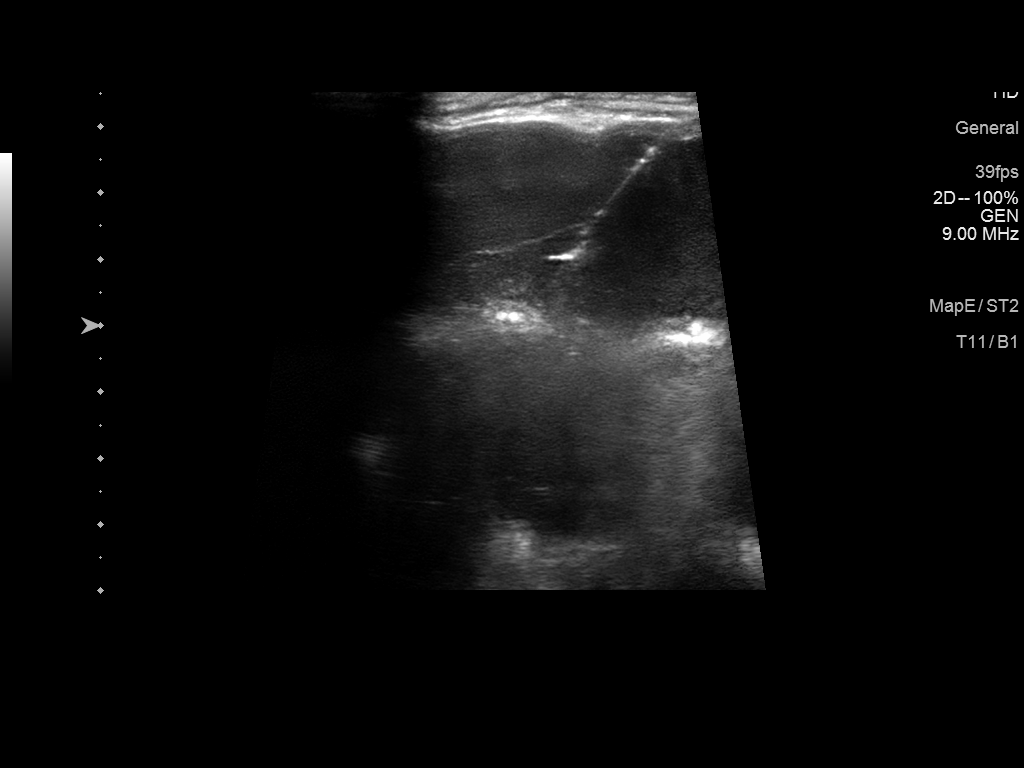
[im 6/6]
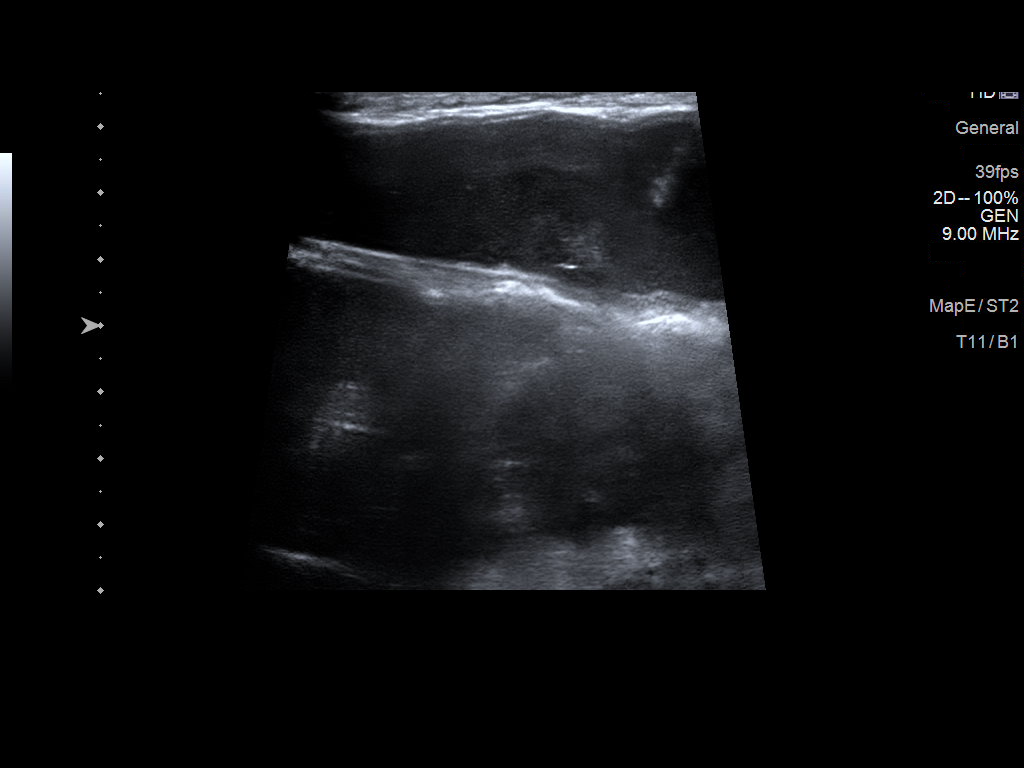

[6 of 6 positions shown; findings below may reference images not displayed]

EXAM:
IR ULTRASOUND GUIDED ASPIRATION/DRAINAGE

MEDICATIONS:
None

ANESTHESIA/SEDATION:
None

COMPLICATIONS:
None

PROCEDURE:
Informed written consent was obtained from the patient after a
thorough discussion of the procedural risks, benefits and
alternatives. All questions were addressed. Maximal Sterile Barrier
Technique was utilized including caps, mask, sterile gowns, sterile
gloves, sterile drape, hand hygiene and skin antiseptic. A timeout
was performed prior to the initiation of the procedure.

Patient is position prone position on the ultrasound stretcher.
Ultrasound images were stored and sent to PACs of the left calf
fluid collection.

The patient was then prepped and draped in the usual sterile
fashion. 1% lidocaine was used for local anesthesia. Using
ultrasound guidance, an 18 gauge trocar needle was advanced into the
fluid collection in the calf. Stylet was removed and aspiration was
attempted. Using multiple small enlarged syringes as well as
manipulating the trocar under ultrasound into various regions of the
cyst, we attempted to aspirate any material. There was 0 volume
aspirated, confirming very dense material/high viscosity material.
Needle was removed and a sterile bandage was placed.

Patient tolerated the procedure well and remained hemodynamically
stable throughout.

No complications were encountered and no significant blood loss.
IMPRESSION: Status post attempt at ultrasound-guided aspiration of left-sided
Baker's cyst yielding 0 cc due to the very high viscosity of the
material. Consideration of incision and drainage may be useful.

## 2021-09-22 NOTE — Procedures (Signed)
Interventional Radiology Procedure Note  Procedure:   Attempt US guided aspiration of left baker's cyst.   Findings: The viscosity of the material is dense.  There was no success in aspiration with 18G trocar needle.  Needle was repositioned into several location.   .  Complications: None  Sample:  0cc  Recommendations:  - Routine wound care - The viscosity of material is very dense.  Needle aspiration unsuccessful, and I am doubtful that there would be meaningful decompression with any other percutaneous drainage technique.  Would consider I&D for decompression.   Signed,  Yvone Neu. Loreta Ave, DO

## 2021-09-23 ENCOUNTER — Telehealth: Payer: Self-pay | Admitting: Orthopaedic Surgery

## 2021-09-23 ENCOUNTER — Ambulatory Visit (INDEPENDENT_AMBULATORY_CARE_PROVIDER_SITE_OTHER): Payer: 59 | Admitting: Podiatry

## 2021-09-23 ENCOUNTER — Other Ambulatory Visit: Payer: Self-pay

## 2021-09-23 DIAGNOSIS — E1149 Type 2 diabetes mellitus with other diabetic neurological complication: Secondary | ICD-10-CM

## 2021-09-23 DIAGNOSIS — Z899 Acquired absence of limb, unspecified: Secondary | ICD-10-CM

## 2021-09-23 DIAGNOSIS — L97512 Non-pressure chronic ulcer of other part of right foot with fat layer exposed: Secondary | ICD-10-CM

## 2021-09-23 DIAGNOSIS — L97521 Non-pressure chronic ulcer of other part of left foot limited to breakdown of skin: Secondary | ICD-10-CM

## 2021-09-23 NOTE — Telephone Encounter (Signed)
Come in for appt

## 2021-09-23 NOTE — Telephone Encounter (Signed)
Pt is asking for a call back as soon as possible. Pt came in for leg drainage yesterday and he states it was unsuccessful and want to know what next steps are. Please call pt at [phone].

## 2021-09-23 NOTE — Telephone Encounter (Signed)
Appt made

## 2021-09-26 ENCOUNTER — Other Ambulatory Visit: Payer: Self-pay

## 2021-09-26 DIAGNOSIS — M7989 Other specified soft tissue disorders: Secondary | ICD-10-CM

## 2021-09-26 DIAGNOSIS — M25562 Pain in left knee: Secondary | ICD-10-CM

## 2021-09-26 DIAGNOSIS — G8929 Other chronic pain: Secondary | ICD-10-CM

## 2021-09-26 NOTE — Progress Notes (Signed)
Subjective: [name] [name] is a 65 y.o. is seen today in office s/p right partial first amputation, partial second toe amputation, I&D.  His wife has been changing the bandage and utilizing the Prisma.  He states that the foot is looking and feeling much better.  Not have much swelling.  The redness is much improved as well.  Denies any pus.  No fevers or chills he reports.  No other concerns today.  Please follow-up with orthopedics for MRI of his left leg.  Try to drain a cyst from radiology but not able to do so.  Per Dr. Clinton Gallant note status post amputation of first toe and partial amputation of second toe with Streptococcus intermedius and group B strep isolated on culture from the OR Beta lactamase + prevotella along with Streptococcus intermedius in the blood    We will have him complete his IV ceftriaxone and oral flagyl through 09/08/2021   We will then have him start oral augmentin 875/125 mg po BID and see him in followup in November  Objective: General: No acute distress, AAOx3  DP/PT pulses palpable 2/4, CRT < 3 sec to all digits.  Right foot: Scab is present along the amputation site.  Is able to loosely debride some of the loose tissue today there is new, healthy, pink skin present underneath this.  Also scab present to dorsal aspect of the foot on the area of the incision and drainage site.  Upon debridement there is a granular wound present without any probing to bone, undermining or tunneling.  No exposed bone or tendon.  There is no significant erythema, cellulitis.  No fluctuation crepitation.  No malodor.    Left foot: Hyperkeratotic lesion the distal aspect of the toe.  Upon debridement superficial skin fissure lesion is present there is no drainage or pus.  No exposed bone or tendon.  No probing.  No surrounding erythema, ascending cellulitis.  No fluctuation crepitation.  No malodor.         Assessment and Plan:  Status post right partial first ray, partial second  toe amputation, I&D, doing well with no complications; left foot ulcer  -Treatment options discussed including all alternatives, risks, and complications -Dressings were changed today.  No sharp debridement loose scab, necrotic tissue today.  Continue with Prisma on the dorsal wound.  Keep a small amount antibiotic ointment on the scabbed area.  -Continue surgical shoe, elevation.  Discussed that he can try to start to increase activity, is not able to do so because of the left leg.  -Monitor for any clinical signs or symptoms of infection and directed to call the office immediately should any occur or go to the ER.   Vivi Barrack DPM

## 2021-09-27 ENCOUNTER — Ambulatory Visit: Payer: 59 | Admitting: Orthopaedic Surgery

## 2021-09-27 ENCOUNTER — Ambulatory Visit
Admission: RE | Admit: 2021-09-27 | Discharge: 2021-09-27 | Disposition: A | Discharge status: Home / Self Care | Payer: 59 | Source: Ambulatory Visit | Attending: Orthopaedic Surgery | Admitting: Orthopaedic Surgery

## 2021-09-27 ENCOUNTER — Other Ambulatory Visit: Payer: 59

## 2021-09-27 DIAGNOSIS — M25562 Pain in left knee: Secondary | ICD-10-CM

## 2021-09-27 DIAGNOSIS — M7989 Other specified soft tissue disorders: Secondary | ICD-10-CM

## 2021-09-27 DIAGNOSIS — G8929 Other chronic pain: Secondary | ICD-10-CM

## 2021-09-27 IMAGING — MR MR KNEE*L* WO/W CM
6 of 9 series · 22 of 40 positions shown · IV contrast (multihance)
Comparison: Tibia/fibular MRI [DATE]

CLINICAL DATA: Left knee pain and swelling

EXAM:
MRI OF THE LEFT KNEE WITHOUT AND WITH CONTRAST
TECHNIQUE: Multiplanar, multisequence MR imaging of the knee was performed
before and after the administration of intravenous contrast.
CONTRAST:  20mL MULTIHANCE GADOBENATE DIMEGLUMINE 529 MG/ML IV SOLN

[Series 3: t2_tse_fs_tra · axial · 4.0mm · 0.56mm/px · z∈[-57,+86]mm · 5 of 31 slices shown]
[im 1/31]
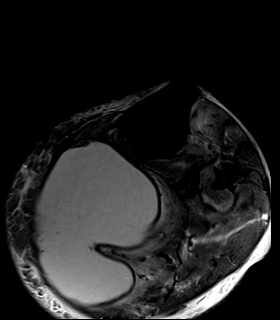
[im 8/31]
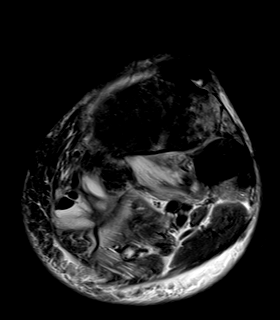
[im 16/31]
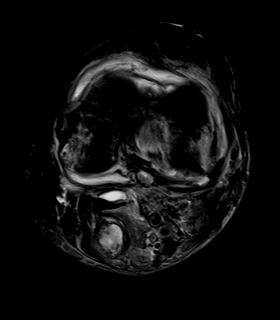
[im 23/31]
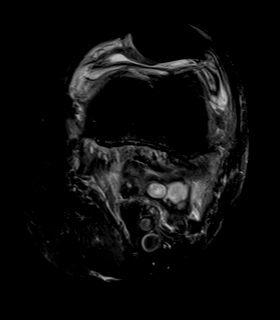
[im 31/31]
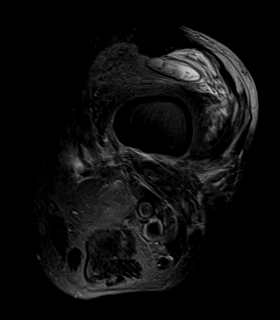

[Series 4: T2 fat-sat · coronal · 4.0mm · 0.70mm/px · 4 of 29 slices shown (1 of 2)]
[im 1/29]
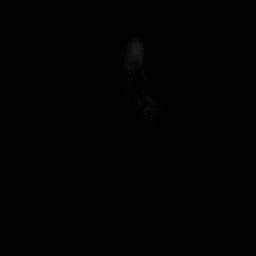
[im 10/29]
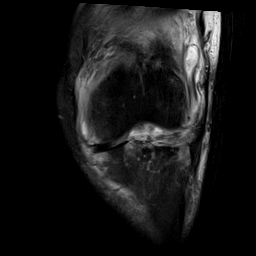
[im 19/29]
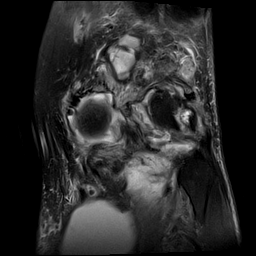
[im 29/29]
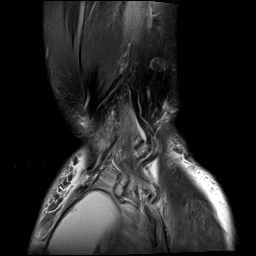

[Series 5: T1 · coronal · 4.0mm · 0.35mm/px · 4 of 29 slices shown]
[im 1/29]
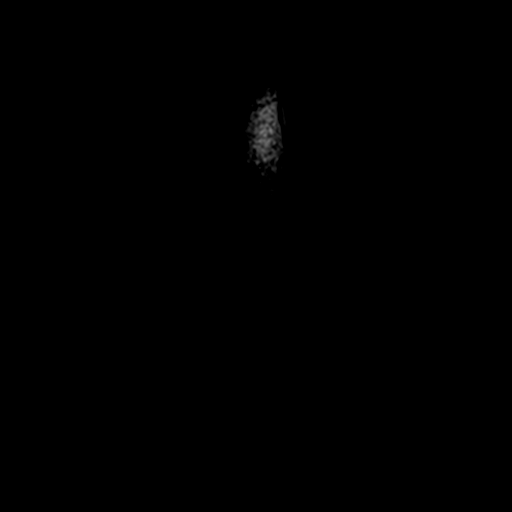
[im 10/29]
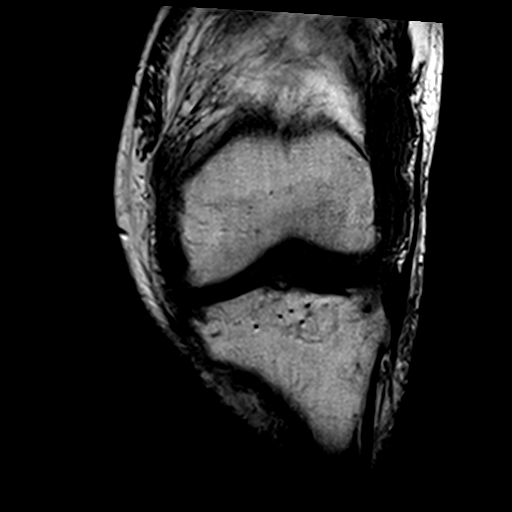
[im 19/29]
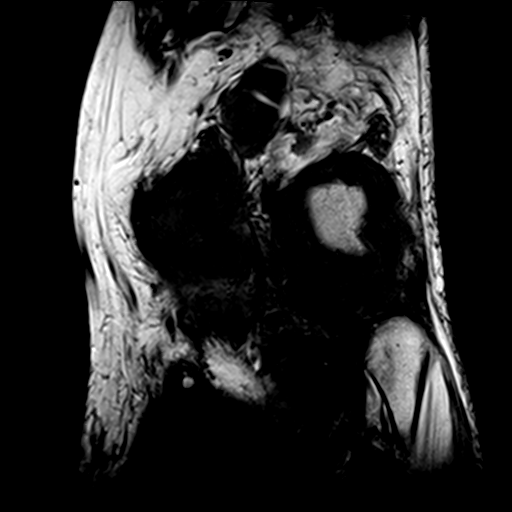
[im 29/29]
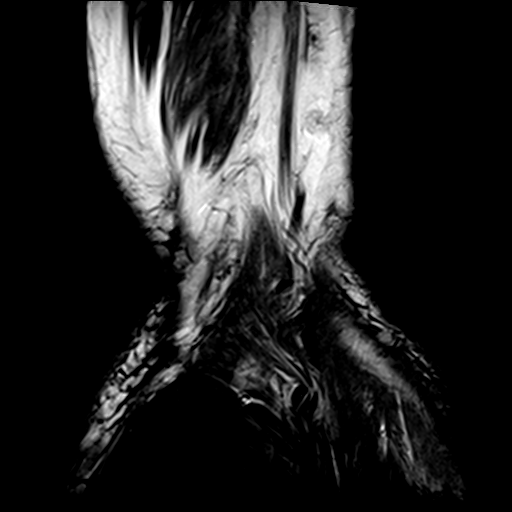

[Series 6: pd_tse_fs_cor, 3mm copy · coronal · 3.0mm · 0.35mm/px · 1 of 36 slices shown]
[im 1/36]
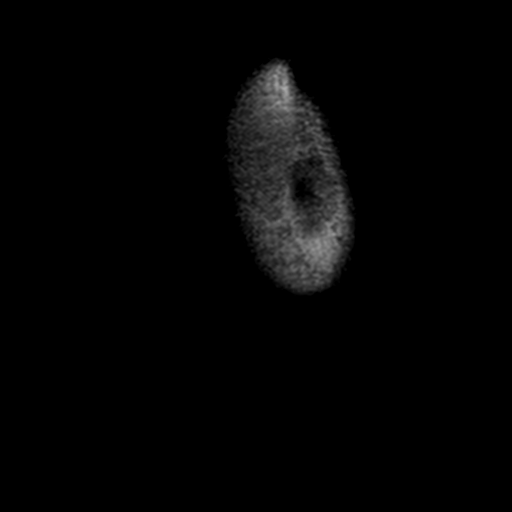

[Series 7: PD fat-sat · sagittal · 3.0mm · 0.35mm/px · 4 of 30 slices shown]
[im 1/30]
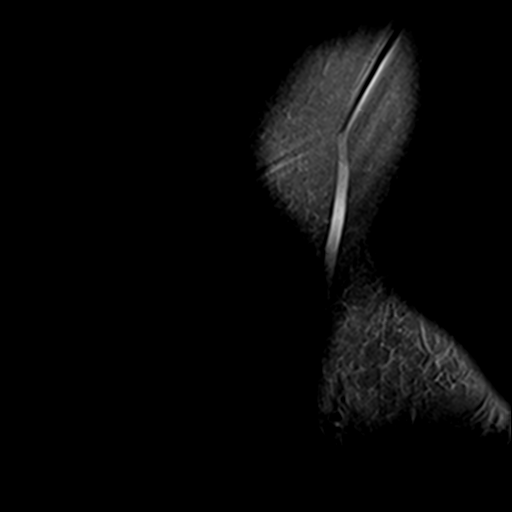
[im 10/30]
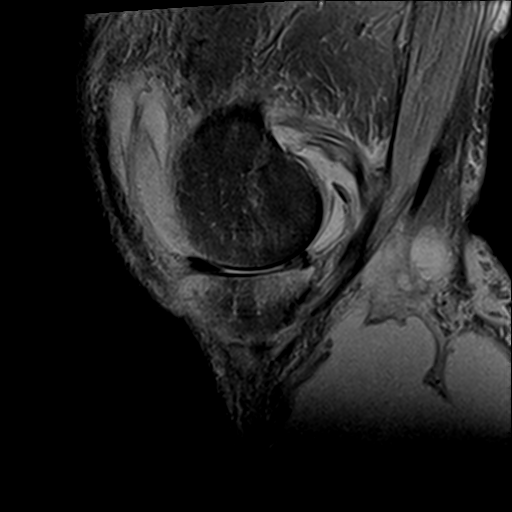
[im 20/30]
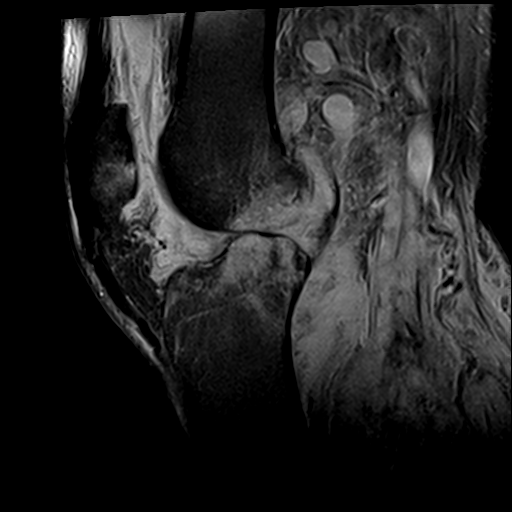
[im 30/30]
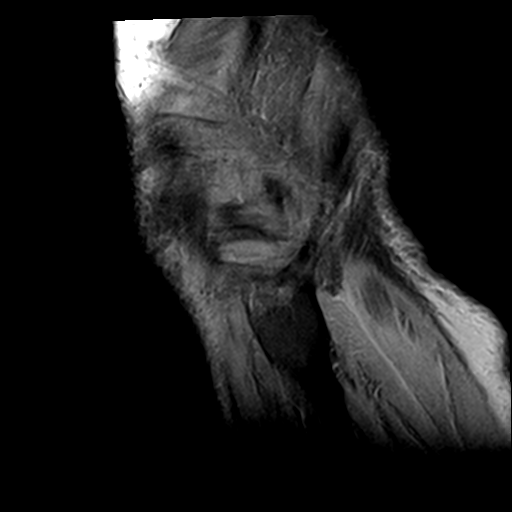

[Series 8: T2 fat-sat · sagittal · 3.0mm · 0.35mm/px · 4 of 30 slices shown (2 of 2)]
[im 1/30]
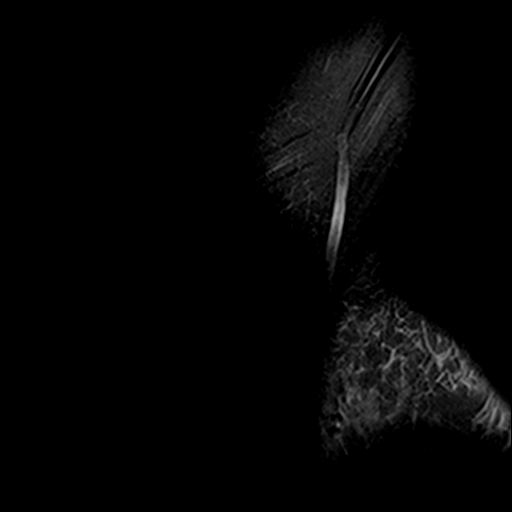
[im 10/30]
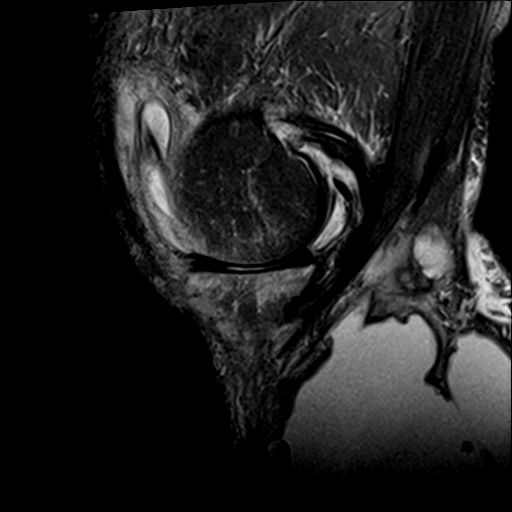
[im 20/30]
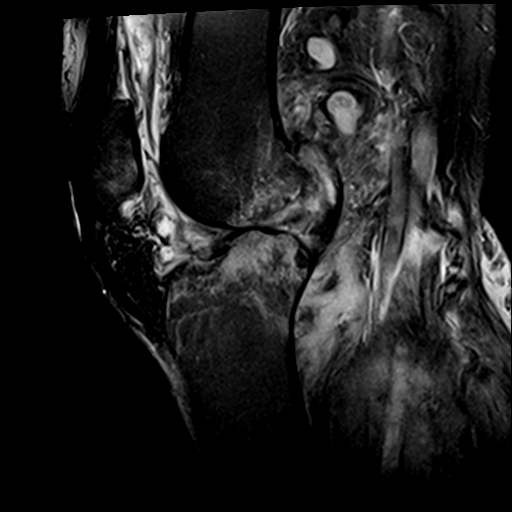
[im 30/30]
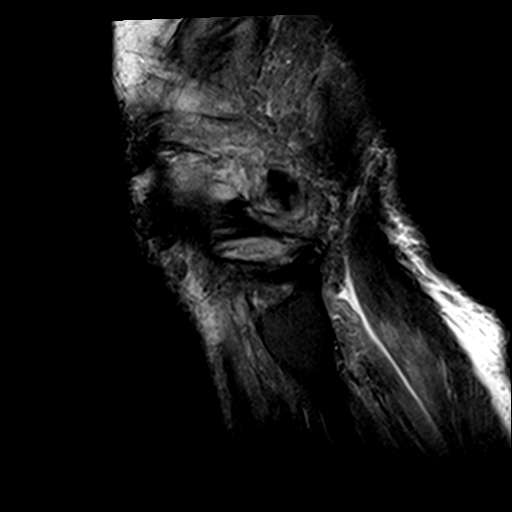

[22 of 40 positions shown; findings below may reference images not displayed]

FINDINGS: Despite efforts by the technologist and patient, motion artifact is
present on today's exam and could not be eliminated. This reduces
exam sensitivity and specificity.

MENISCI

Medial meniscus: Diminutive size of the posterior horn and midbody
medial meniscus with abnormal degenerative signal involving the free
edge and posterior and inferior surfaces of the posterior horn
compatible with degenerative tearing. I do not see an obvious flap.

Lateral meniscus: Nonvisualization of the posterior horn lateral
meniscus, with only a thin rim of extruded lateral meniscal tissue
appreciable in the anterior horn and midbody, compatible with severe
degenerative tearing.

LIGAMENTS

Cruciates: Expanded and severely edematous ACL with lax portions
posteriorly compatible with ACL cyst and potentially ACL tear.
Degenerated PCL.

Collaterals:  Unremarkable

CARTILAGE

Patellofemoral:  Mild chondral thinning.

Medial: Severe chondral thinning with marginal spurring and edema
along the rim of the distal femur and proximal tibia, potentially
with mild stress fracture or subtle impaction along the marginal rim
of the tibial plateau medially and posteriorly on images 13-17 of
series 4.

Lateral: Severe chondral thinning. Prominent marrow edema in the
lateral tibial plateau with demineralized/eroded cortex for example
on images 19 through 23 of series 6. Similar attenuated cortical
visualization with underlying marrow edema posteriorly along the
lateral femoral condyle on image 17 series 6.

Joint: Small knee effusion with notable enhancing synovitis. A
central and lateral popliteal cystic lesion with enhancing margins
appears to arise from the joint and measures 3.7 by 3.7 by 1.2 cm
(volume = 8.6 cm^3) on image 9 series 4. This may be attaching to
the proximal ACL, and accordingly could represent a ganglion cyst
involving the ACL and also extending outside of the joint.

Popliteal Fossa: Very large complex and likely loculated Baker's
cyst with only minimally thickened enhancing margins extends down
into the calf, as shown on the recent calf MRI, and is incompletely
imaged on today's knee MRI. Additional separate popliteal cyst
likely connected to the knee joint is noted above. There is
substantial muscular edema especially in the popliteus muscle and to
a lesser extent in the gastrocnemius muscles and proximal soleus
muscle. There is also abnormal edema in the upper anterior
compartmental musculature. There is some mild enhancement of the
popliteus muscle but most of the other musculature is not
substantially enhancing.

Extensor Mechanism:  Unremarkable

Bones: Lateral compartmental cortical demineralization is noted
above under the cartilage section. Mild subcortical marrow edema
along the posterior patellar ridge.

Other: Subcutaneous edema posteriorly in the calf, without a
substantial degree of associated subcutaneous enhancement.
IMPRESSION: 1. Small knee effusion but with extensive synovitis and edema signal
around the joint margins, as well as abnormal edema especially in
the lateral tibial plateau with associated cortical demineralization
along the lateral tibial plateau and posteriorly along the lateral
femoral condyle. Although the appearance could be due to erosive
arthropathy, septic joint with early osteomyelitis along the lateral
compartment is not excluded and if the patient has correlating
symptoms around the knee thin arthrocentesis may be warranted.
2. Medial and lateral meniscal tears, especially severely in the
posterior horn lateral meniscus which is essentially not well seen.
3. Abnormal ACL with fluid signal expansion and components with a
slope less than that of Blumensaat's line. Appearance compatible
with a CS cyst and possible ACL tear.
4. There is also a multilobulated fluid signal intensity collection
extending posteriorly from the joint in the vicinity of the proximal
ACL, which could represent a ganglion cyst with intra-articular/ACL
component and extra-articular component.
5. Very large Baker's cyst extending down in the calf.
6. Edema and enhancement in the popliteus muscle. Edema without
substantial enhancement in the gastrocnemius musculature, soleus
muscle, and anterior compartmental musculature of the calf.
7. Prominent chondral thinning in the medial and lateral
compartments. Marrow edema along the medial compartmental rims with
possible small subcortical stress fracture along the rim of the
medial tibial plateau.

## 2021-09-27 MED ORDER — GADOBENATE DIMEGLUMINE 529 MG/ML IV SOLN
20.0000 mL | Freq: Once | INTRAVENOUS | Status: AC | PRN
  Administered 2021-09-27: 20 mL via INTRAVENOUS

## 2021-09-29 ENCOUNTER — Ambulatory Visit (INDEPENDENT_AMBULATORY_CARE_PROVIDER_SITE_OTHER): Payer: 59 | Admitting: Podiatry

## 2021-09-29 ENCOUNTER — Other Ambulatory Visit: Payer: Self-pay

## 2021-09-29 ENCOUNTER — Ambulatory Visit (INDEPENDENT_AMBULATORY_CARE_PROVIDER_SITE_OTHER): Payer: 59

## 2021-09-29 DIAGNOSIS — L97512 Non-pressure chronic ulcer of other part of right foot with fat layer exposed: Secondary | ICD-10-CM

## 2021-09-29 DIAGNOSIS — E1149 Type 2 diabetes mellitus with other diabetic neurological complication: Secondary | ICD-10-CM

## 2021-09-29 DIAGNOSIS — Z899 Acquired absence of limb, unspecified: Secondary | ICD-10-CM | POA: Diagnosis not present

## 2021-09-29 MED ORDER — SANTYL 250 UNIT/GM EX OINT: 1.0000 "application " | TOPICAL_OINTMENT | Freq: Every day | CUTANEOUS | 0 refills | Status: DC

## 2021-10-02 NOTE — Progress Notes (Signed)
Subjective: DEMARIUS ARCHILA is a 65 y.o. is seen today in office s/p right partial first amputation, partial second toe amputation, I&D.  On the right foot has been doing well.  Has not been having any discomfort.  Is on antibiotics.  Denies fevers or chills.  No chest pain, shortness of breath.  He has follow-up with orthopedics for cyst on the left leg.  Per Dr. Clinton Gallant note status post amputation of first toe and partial amputation of second toe with Streptococcus intermedius and group B strep isolated on culture from the OR Beta lactamase + prevotella along with Streptococcus intermedius in the blood    We will have him complete his IV ceftriaxone and oral flagyl through 09/08/2021   We will then have him start oral augmentin 875/125 mg po BID and see him in followup in November  Objective: General: No acute distress, AAOx3  DP/PT pulses palpable 2/4, CRT < 3 sec to all digits.  Right foot: Scab is present along the amputation site.  I was able to repeat this today and there is underlying fibrotic wound.  The wound measured 5 x 1 x 0.3 cm there is no probing, undermining or tunneling.  Faint rim of erythema likely more from inflammation.  No ascending cellulitis.  There is also a wound on the dorsal aspect of the foot with hyperkeratotic tissue.  Upon debridement this wound is smaller today and there is no exposed bone or tendon.  There is no fluctuation crepitation.  No malodor.  Left foot: Minimal hyperkeratotic tissue.  Upon debridement.  The ulceration is healed.  Assessment and Plan:  Status post right partial first ray, partial second toe amputation, I&D, doing well with no complications; left foot ulcer  -Treatment options discussed including all alternatives, risks, and complications -X-rays obtained reviewed.  No obvious signs of acute osteomyelitis.  I was concerned that some changes noted the second metatarsal medially but does not appear to be changed much compared to prior  x-rays. -To remove the thick scab present on the amputation site.  There is a fibrotic wound base noted.  Ordered Santyl to apply to the wound followed by saline wet-to-dry over top of this.  Continue daily dressing changes and bleeding to the similar bandage on the dorsal foot as well.  Continue offloading/elevating -Finish course of antibiotics per infectious disease -Monitor for any clinical signs or symptoms of infection and directed to call the office immediately should any occur or go to the ER.  Vivi Barrack DPM

## 2021-10-04 ENCOUNTER — Encounter: Payer: Self-pay | Admitting: Orthopaedic Surgery

## 2021-10-04 ENCOUNTER — Other Ambulatory Visit: Payer: Self-pay

## 2021-10-04 ENCOUNTER — Telehealth: Payer: Self-pay | Admitting: *Deleted

## 2021-10-04 ENCOUNTER — Ambulatory Visit (INDEPENDENT_AMBULATORY_CARE_PROVIDER_SITE_OTHER): Payer: 59 | Admitting: Orthopaedic Surgery

## 2021-10-04 ENCOUNTER — Telehealth: Payer: Self-pay

## 2021-10-04 DIAGNOSIS — M7989 Other specified soft tissue disorders: Secondary | ICD-10-CM

## 2021-10-04 NOTE — Telephone Encounter (Signed)
Patient called to report that after seeing ortho in regards to the Baker's cyst on his leg, they have opted to drain it. Patient wants to confirm that this is ok with Dr. Daiva Eves before pursuing.  Forwarding to provider.  Nicola Quesnell Loyola Mast, RN

## 2021-10-04 NOTE — Progress Notes (Signed)
Office Visit Note   Patient: [name] [name]           Date of Birth: 1956-02-01           MRN: [id number] Visit Date: 10/04/2021              Requested by: Tally Joe, MD [phone] [place],  [place] [address] PCP: Tally Joe, MD   Assessment & Plan: Visit Diagnoses:  1. Left leg swelling     Plan: MRIs of the leg and the knee are negative for malignancy.  He does have a very large Baker's cyst that has traveled down the calf.  Given its size and how this is causing significant pain and limitation with ambulation and with failure of conservative management in terms of IR guided aspiration I have recommended surgical evacuation as soon as he is cleared by Dr. Algis Liming.  I unwrapped the right foot and the wound is benign and healing well without any evidence of infection.  He will get back in touch with [place] as soon as Dr. Algis Liming clears him for surgery.  Follow-Up Instructions: No follow-ups on file.   Orders:  No orders of the defined types were placed in this encounter.  No orders of the defined types were placed in this encounter.     Procedures: No procedures performed   Clinical Data: No additional findings.   Subjective: Chief Complaint  Patient presents with   Left Knee - Pain    HPI  [name] [name] is here to discuss recent the left knee and left tib-fib MRI.  He feels that the swelling is getting worse and the symptoms are getting worse.  Review of Systems   Objective: Vital Signs: There were no vitals taken for this visit.  Physical Exam  Ortho Exam  Left calf shows large swelling.  No overlying skin compromise.  No cellulitis.  Compartments are soft.  No neurovascular compromise.  Specialty Comments:  No specialty comments available.  Imaging: No results found.   PMFS History: Patient Active Problem List   Diagnosis Date Noted   Hyponatremia    AKI (acute kidney injury) (HCC)    Elevated LFTs    Post-operative state     Cellulitis of left lower extremity    Streptococcal bacteremia    Cellulitis of right lower extremity    Osteomyelitis of great toe of right foot (HCC)    Osteomyelitis of second toe of right foot (HCC)    Diabetes mellitus due to underlying condition with diabetic autonomic neuropathy, without long-term current use of insulin (HCC)    Class 3 severe obesity with serious comorbidity and body mass index (BMI) of 45.0 to 49.9 in adult [facility])    Toe infection 08/08/2021   CAD (coronary artery disease) of artery bypass graft 04/21/2015   Hyperlipidemia 04/21/2015   Essential hypertension 04/21/2015   Type 2 diabetes mellitus (HCC) 04/21/2015   Ulcer of foot (HCC) 08/22/2014   Past Medical History:  Diagnosis Date   Anxiety    Bulbous urethral stricture    chronic--- hx multiple dilatation's and urolume stent's   CAD (coronary artery disease) 04/ 2011  (previous cardiologist-  dr h. Katrinka Blazing , lov 06/ 2016)  currently followed by pcp   03-03-2010  CABG x 5, LIMA to LAD, left radial artery to OM1,SVG to R1, seqSVG to PDA and PLA   ED (erectile dysfunction)    Essential hypertension, benign    GERD (gastroesophageal reflux disease)  History of pancreatitis 2005   Mixed hyperlipidemia    OSA on CPAP    S/P CABG x 5 03/03/2010   Type II or unspecified type diabetes mellitus without mention of complication, not stated as uncontrolled    endocrinologist-  dr Sharl Ma    Family History  Problem Relation Age of Onset   Cancer Mother        lung   Hypertension Father    CAD Father    CVA Brother     Past Surgical History:  Procedure Laterality Date   AMPUTATION Right 08/09/2021   Procedure: AMPUTATION DIGIT;  Surgeon: Vivi Barrack, DPM;  Location: [facility];  Service: Podiatry;  Laterality: Right;   CARDIAC CATHETERIZATION  03-01-2010   dr Verdis Prime   nuclear stress test --positive moderate ishemia:  severe multivessel CAD w/ total occlusion of pLAD, HG obstrucion of pPDA with supplies  collaterals around the apex to the LAD, severe stenosis distal RCA, total occlusion OM1, 75% D1, normal LVF    CORONARY ARTERY BYPASS GRAFT  03-03-2010  dr hendrickson    LIMA to LAD, SVG to RI, left radial artery to OM1, seqSVG  to PDA and PLA   CYSTO W/ DILATATION , LASER INCISION OF URETHRAL STRICTURE  02-25-2005   dr Vernie Ammons  [facility]   CYSTO/  DILATION URETHRAL MEATAL STENOSIS AND DILATION BULBOUS STRICTURE  12-21-2004   dr Vonita Moss  [facility]   CYSTO/ RETROGRADE PYELOGRAM/ DILATATION URETHRAL STRICTURE AND PLACEMENT UROLUME STENT  08-04-2011    dr Vernie Ammons  [facility]   CYSTOSCOPY WITH DIRECT VISION INTERNAL URETHROTOMY  07/15/2008   dr Vernie Ammons  [facility]   and UroLume Stent placement   CYSTOSCOPY WITH URETHRAL DILATATION N/A 04/19/2018   Procedure: CYSTOSCOPY WITH BALLOON URETHRAL DILATATION;  Surgeon: Ihor Gully, MD;  Location: [facility] ;  Service: Urology;  Laterality: N/A;   INCISION AND DRAINAGE Right 08/13/2021   Procedure: INCISION AND DRAINAGE RIGHT FOOT WOUND;  Surgeon: Vivi Barrack, DPM;  Location: [facility];  Service: Podiatry;  Laterality: Right;   ORIF TOE FRACTURE Left 05/22/2013   Procedure: OPEN REDUCTION INTERNAL FIXATION (ORIF) LEFT SECOND TOE PROXIMAL PHALANX FRACTURE/LEFT PERCUTANEOUS PINNING;  Surgeon: Toni Arthurs, MD;  Location: [facility];  Service: Orthopedics;  Laterality: Left;   TONSILLECTOMY  child   Social History   Occupational History   Not on file  Tobacco Use   Smoking status: Never   Smokeless tobacco: Never  Vaping Use   Vaping Use: Never used  Substance and Sexual Activity   Alcohol use: Not Currently    Comment: occasional   Drug use: No   Sexual activity: Not on file

## 2021-10-04 NOTE — Telephone Encounter (Signed)
Patient's wife is calling because her husband is planning surgery with an Orthopedic doctor,patient would like to speak with you since currently treating his right foot,has a baker's cyst on the left, wants to make sure you are on board with his plan. Please advise.

## 2021-10-05 NOTE — Telephone Encounter (Signed)
Patient notified that Dr. Daiva Eves has no concerns about cyst drainage. No further questions/concerns at this time.   [name] Horst Loyola Mast, RN

## 2021-10-06 ENCOUNTER — Telehealth: Payer: Self-pay | Admitting: Orthopaedic Surgery

## 2021-10-06 ENCOUNTER — Ambulatory Visit (INDEPENDENT_AMBULATORY_CARE_PROVIDER_SITE_OTHER): Payer: 59 | Admitting: Infectious Disease

## 2021-10-06 ENCOUNTER — Other Ambulatory Visit: Payer: Self-pay

## 2021-10-06 ENCOUNTER — Encounter: Payer: Self-pay | Admitting: Infectious Disease

## 2021-10-06 VITALS — BP 132/84 | HR 111 | Temp 97.6°F

## 2021-10-06 DIAGNOSIS — I25709 Atherosclerosis of coronary artery bypass graft(s), unspecified, with unspecified angina pectoris: Secondary | ICD-10-CM | POA: Diagnosis not present

## 2021-10-06 DIAGNOSIS — M7122 Synovial cyst of popliteal space [Baker], left knee: Secondary | ICD-10-CM

## 2021-10-06 DIAGNOSIS — M25461 Effusion, right knee: Secondary | ICD-10-CM

## 2021-10-06 DIAGNOSIS — M869 Osteomyelitis, unspecified: Secondary | ICD-10-CM

## 2021-10-06 DIAGNOSIS — M25469 Effusion, unspecified knee: Secondary | ICD-10-CM

## 2021-10-06 DIAGNOSIS — L03116 Cellulitis of left lower limb: Secondary | ICD-10-CM | POA: Diagnosis not present

## 2021-10-06 DIAGNOSIS — M7120 Synovial cyst of popliteal space [Baker], unspecified knee: Secondary | ICD-10-CM

## 2021-10-06 DIAGNOSIS — B955 Unspecified streptococcus as the cause of diseases classified elsewhere: Secondary | ICD-10-CM

## 2021-10-06 DIAGNOSIS — R230 Cyanosis: Secondary | ICD-10-CM

## 2021-10-06 DIAGNOSIS — R7881 Bacteremia: Secondary | ICD-10-CM

## 2021-10-06 DIAGNOSIS — M25462 Effusion, left knee: Secondary | ICD-10-CM | POA: Insufficient documentation

## 2021-10-06 HISTORY — DX: Effusion, unspecified knee: M25.469

## 2021-10-06 HISTORY — DX: Synovial cyst of popliteal space (Baker), unspecified knee: M71.20

## 2021-10-06 HISTORY — DX: Cyanosis: R23.0

## 2021-10-06 NOTE — Telephone Encounter (Signed)
Patient called to ask about a surgery date in December to do the incision in his calf to drain the cyst.  [name] [name] said he went to Dr. Daiva Eves today and was told it was ok to move forward with the surgery.  Please provide surgery sheet if surgery is in order.

## 2021-10-06 NOTE — Progress Notes (Signed)
Subjective:  Chief complaint: Follow-up for diabetic foot infection bacteremia cellulitis and left lower extremity edema  Patient ID: [name] [name], male    DOB: 1956/01/26, 65 y.o.   MRN: [id number]  HPI  65 y.o. male with diabetes mellitus obesity, coronary artery disease who has been struggling with bilateral diabetic foot ulcers now admitted with acute worsening of drainage from his right great toe also with onset of erythema consistent with cellulitis in his left lower extremity.  Plain films of shown osteomyelitis in the still phalanx of the great toe and second toe on the right.   RI shows the ulceration that is obvious on exam with communication of the joint space and first IP joint with septic arthritis and osteomyelitis of first proximal and distal phalanges with pathological fracture of the base of the first distal phalanx with ulceration of the tip of the second toe with underlying osteomyelitis the second distal phalanx tear of the flexor pollicis longus and infectious tenosynovitis with multiple forefoot abscess   He also has an ulcer in his left great toe as well   Fortunately MRI did not show evidence of osteomyelitis in that foot  Patient was taken the operating room and went underwent first toe and partial amputation of second toe.  Cultures grew Streptococcus intermedius and group B streptococcus as well as a beta-lactamase positive Prevotella.  Grew strep intermedius from blood cultures as well.  2D echocardiogram failed to show evidence of endocarditis.  Why was in the hospital I last saw him we had switched him to continuous penicillin along with oral metronidazole.  He ended up changing eventually to ceftriaxone and metronidazole and was discharged home with plans to complete antibiotics on 20 October.  Been followed closely by Dr. Earleen Newport with podiatry and who is happy with the progress of the foot.  His inflammatory markers show relative stability of his sed rate  which she went been 37 and 44 in the inpatient world and was 43 when checked on the 10th with home health.  CRP had been elevated at 18.4 in 26 September and has come down since then more recently to 19.  He did have worsening of his renal function with a creatinine having bumped up to 2 on October 10.  He says he saw his primary care doctor  the 13th and had a repeat metabolic panel checked with increased fluid intake and that his kidney function was listed as being normal.  He did have worsening swelling in his left leg where he was found to have a Baker's cyst when he was in the hospital.  Cellulitis in the same leg is long since resolved.  We ordered Dopplers of the left lower extremity to exclude deep venous thrombosis and there as a was none.  However the edema worsened and ultimately we deferred him to orthopedic surgery and he saw Dr. Erlinda Hong.  He mass had enlarged since then.  Dr. Erlinda Hong performed an aspirate which I believe was from the patients knee with blood-tinged fluid isolated which had 6263 white blood cells with 89% neutrophils 3% lymphocytes gram stain and culture were negative though keep in mind the patient has been on antibiotics.  MRI of the leg was obtained which showed a small knee effusion but with extensive synovitis and edema in the joint margins as well as lateral tibial plateau with some erosive changes that radiology felt could be due to septic joint and early osteomyelitis versus an erosive arthropathy.  There is also  medial and lateral meniscal tears, and abnormal ACL with fluid expansion in the area they are compatible with CS cyst and a multilobulated fluid collection posterior to the joint which could be ganglion cyst with also a large Baker's cyst extending down into the calf.  Dr Roda ShuttersXu is planning on surgery to the large baker's cyst.  Patient's left knee is not especially tender is a slight effusion his left calf is dramatically enlarged see photo.  He now has some  cyanotic changes involving his left great toe and his left foot is much cooler than the right.  His right foot where he had osteomyelitis and resection is healing well.  He has no systemic symptoms of infection such as fevers chills nausea malaise.          Past Medical History:  Diagnosis Date   Anxiety    Bulbous urethral stricture    chronic--- hx multiple dilatation's and urolume stent's   CAD (coronary artery disease) 04/ 2011  (previous cardiologist-  dr h. Katrinka Blazingsmith , lov 06/ 2016)  currently followed by pcp   03-03-2010  CABG x 5, LIMA to LAD, left radial artery to OM1,SVG to R1, seqSVG to PDA and PLA   ED (erectile dysfunction)    Essential hypertension, benign    GERD (gastroesophageal reflux disease)    History of pancreatitis 2005   Mixed hyperlipidemia    OSA on CPAP    S/P CABG x 5 03/03/2010   Type II or unspecified type diabetes mellitus without mention of complication, not stated as uncontrolled    endocrinologist-  dr Sharl Makerr    Past Surgical History:  Procedure Laterality Date   AMPUTATION Right 08/09/2021   Procedure: AMPUTATION DIGIT;  Surgeon: Vivi BarrackWagoner, Matthew R, DPM;  Location: [facility];  Service: Podiatry;  Laterality: Right;   CARDIAC CATHETERIZATION  03-01-2010   dr Verdis Primehenry smith   nuclear stress test --positive moderate ishemia:  severe multivessel CAD w/ total occlusion of pLAD, HG obstrucion of pPDA with supplies collaterals around the apex to the LAD, severe stenosis distal RCA, total occlusion OM1, 75% D1, normal LVF    CORONARY ARTERY BYPASS GRAFT  03-03-2010  dr hendrickson    LIMA to LAD, SVG to RI, left radial artery to OM1, seqSVG  to PDA and PLA   CYSTO W/ DILATATION , LASER INCISION OF URETHRAL STRICTURE  02-25-2005   [facility]   CYSTO/  DILATION URETHRAL MEATAL STENOSIS AND DILATION BULBOUS STRICTURE  12-21-2004   [facility]   CYSTO/ RETROGRADE PYELOGRAM/ DILATATION URETHRAL STRICTURE AND PLACEMENT UROLUME STENT  08-04-2011    [facility]  [facility]elin  [facility]   CYSTOSCOPY WITH DIRECT VISION INTERNAL URETHROTOMY  07/15/2008   dr [facility]elin  [facility]   and UroLume Stent placement   CYSTOSCOPY WITH URETHRAL DILATATION N/A 04/19/2018   Procedure: CYSTOSCOPY WITH BALLOON URETHRAL DILATATION;  Surgeon: Ihor Gullyttelin, Mark, MD;  [facility];  Service: Urology;  Laterality: N/A;   INCISION AND DRAINAGE Right 08/13/2021   Procedure: INCISION AND DRAINAGE RIGHT FOOT WOUND;  Surgeon: Vivi BarrackWagoner, Matthew R, DPM;  Location: [facility];  Service: Podiatry;  Laterality: Right;   ORIF TOE FRACTURE Left 05/22/2013   Procedure: OPEN REDUCTION INTERNAL FIXATION (ORIF) LEFT SECOND TOE PROXIMAL PHALANX FRACTURE/LEFT PERCUTANEOUS PINNING;  Surgeon: Toni ArthursJohn Hewitt, MD;  Location: [facility];  Service: Orthopedics;  Laterality: Left;   TONSILLECTOMY  child    Family History  Problem Relation Age of Onset   Cancer Mother  lung   Hypertension Father    CAD Father    CVA Brother       Social History   Socioeconomic History   Marital status: Married    Spouse name: Not on file   Number of children: Not on file   Years of education: Not on file   Highest education level: Not on file  Occupational History   Not on file  Tobacco Use   Smoking status: Never   Smokeless tobacco: Never  Vaping Use   Vaping Use: Never used  Substance and Sexual Activity   Alcohol use: Not Currently    Comment: occasional   Drug use: No   Sexual activity: Not on file  Other Topics Concern   Not on file  Social History Narrative   Not on file   Social Determinants of Health   Financial Resource Strain: Not on file  Food Insecurity: Not on file  Transportation Needs: Not on file  Physical Activity: Not on file  Stress: Not on file  Social Connections: Not on file    No Known Allergies   Current Outpatient Medications:    ALPRAZolam (XANAX) 0.5 MG tablet, Take 0.5 mg by mouth 2 (two) times daily as needed for anxiety., Disp: , Rfl:    amLODipine  (NORVASC) 10 MG tablet, Take 10 mg by mouth at bedtime., Disp: , Rfl:    amoxicillin-clavulanate (AUGMENTIN) 875-125 MG tablet, Take 1 tablet by mouth 2 (two) times daily., Disp: 60 tablet, Rfl: 1   aspirin 325 MG tablet, Take 325 mg by mouth daily., Disp: , Rfl:    atorvastatin (LIPITOR) 80 MG tablet, Take 80 mg by mouth every evening., Disp: , Rfl:    buPROPion (WELLBUTRIN XL) 300 MG 24 hr tablet, Take 300 mg by mouth every morning. , Disp: , Rfl:    clindamycin (CLEOCIN) 300 MG capsule, Take 300 mg by mouth 4 (four) times daily., Disp: , Rfl:    collagenase (SANTYL) ointment, Apply 1 application topically daily., Disp: 90 g, Rfl: 0   fenofibrate 160 MG tablet, Take 160 mg by mouth every evening., Disp: , Rfl:    fish oil-omega-3 fatty acids 1000 MG capsule, Take 1 g by mouth daily. , Disp: , Rfl:    HUMALOG MIX 50/50 KWIKPEN (50-50) 100 UNIT/ML Kwikpen, Inject 40 Units into the skin 3 (three) times daily before meals., Disp: , Rfl:    metoprolol tartrate (LOPRESSOR) 100 MG tablet, Take 100 mg by mouth 2 (two) times daily., Disp: , Rfl:    NOVOFINE 32G X 6 MM MISC, , Disp: , Rfl:    olmesartan-hydrochlorothiazide (BENICAR HCT) 40-25 MG per tablet, Take 1 tablet by mouth every morning. , Disp: , Rfl:    omeprazole (PRILOSEC) 20 MG capsule, Take 20 mg by mouth every evening. , Disp: , Rfl:    OZEMPIC, 1 MG/DOSE, 2 MG/1.5ML SOPN, Inject 1 mg into the skin once a week. Friday, Disp: , Rfl:    polyethylene glycol (MIRALAX / GLYCOLAX) 17 g packet, Take 17 g by mouth daily as needed for moderate constipation., Disp: 14 each, Rfl: 0   potassium chloride SA (K-DUR,KLOR-CON) 20 MEQ tablet, Take 40 mEq by mouth every morning., Disp: , Rfl:    senna-docusate (SENOKOT-S) 8.6-50 MG tablet, Take 2 tablets by mouth 2 (two) times daily., Disp: , Rfl:    Vitamin D, Ergocalciferol, (DRISDOL) 1.25 MG (50000 UNIT) CAPS capsule, Take 1 capsule (50,000 Units total) by mouth every 7 (seven) days., Disp:  5 capsule, Rfl:  0   oxyCODONE (OXY IR/ROXICODONE) 5 MG immediate release tablet, Take 1 tablet (5 mg total) by mouth every 4 (four) hours as needed for moderate pain. (Patient not taking: Reported on 09/02/2021), Disp: 15 tablet, Rfl: 0   Review of Systems  Constitutional:  Negative for activity change, appetite change, chills, diaphoresis, fatigue, fever and unexpected weight change.  HENT:  Negative for congestion, rhinorrhea, sinus pressure, sneezing, sore throat and trouble swallowing.   Eyes:  Negative for photophobia and visual disturbance.  Respiratory:  Negative for cough, chest tightness, shortness of breath, wheezing and stridor.   Cardiovascular:  Positive for leg swelling. Negative for chest pain and palpitations.  Gastrointestinal:  Negative for abdominal distention, abdominal pain, anal bleeding, blood in stool, constipation, diarrhea, nausea and vomiting.  Genitourinary:  Negative for difficulty urinating, dysuria, flank pain and hematuria.  Musculoskeletal:  Positive for myalgias. Negative for arthralgias, back pain, gait problem and joint swelling.  Skin:  Positive for wound. Negative for color change, pallor and rash.  Neurological:  Negative for dizziness, tremors, weakness and light-headedness.  Hematological:  Negative for adenopathy. Does not bruise/bleed easily.  Psychiatric/Behavioral:  Negative for agitation, behavioral problems, confusion, decreased concentration, dysphoric mood and sleep disturbance. The patient is not nervous/anxious and is not hyperactive.       Objective:   Physical Exam Constitutional:      Appearance: He is well-developed.  HENT:     Head: Normocephalic and atraumatic.  Eyes:     Conjunctiva/sclera: Conjunctivae normal.  Cardiovascular:     Rate and Rhythm: Normal rate and regular rhythm.  Pulmonary:     Effort: Pulmonary effort is normal. No respiratory distress.     Breath sounds: No wheezing.  Abdominal:     General: There is no distension.      Palpations: Abdomen is soft.  Musculoskeletal:     Cervical back: Normal range of motion and neck supple.  Skin:    General: Skin is warm and dry.     Coloration: Skin is not pale.  Neurological:     General: No focal deficit present.     Mental Status: He is alert and oriented to person, place, and time.  Psychiatric:        Mood and Affect: Mood normal.        Behavior: Behavior normal.        Thought Content: Thought content normal.        Judgment: Judgment normal.     Right operative site 09/02/2021:         Right foot October 06, 2021:      Left calf, left knee October 07, 2019:    Left foot October 06, 2021:       Assessment & Plan:   Polymicrobial osteomyelitis of the right foot.  He is status post amputation of first toe and partial amputation of second toe with Streptococcus intermedius and group B streptococcus isolated on culture from the OR along with a beta-lactamase producing Prevotella species and Streptococcus intermedius found also in the blood.  He has completed IV antibiotics with ceftriaxone and oral metronidazole through October 20 and been on Augmentin 875 125 since then twice daily.  We will check inflammatory markers BMP and CBC with differential today.  If inflammatory markers are reassuring I will stop his Augmentin.    status post amputation of first toe and partial amputation of second toe with Streptococcus intermedius and group B strep isolated on culture  from the OR Beta lactamase + prevotella along with Streptococcus intermedius in the blood    Streptococcus intermedius bacteremia: He is received sufficient treatment for this did not have evidence of endocarditis on 2D echocardiogram  Left sided large Baker's cyst:  I am fully supportive of my surgery to remove the cyst.  I have not seen a cyst that is larger one that is infected but certainly if the area looks infected would send for culture.  Left knee effusion: We  will need to be monitored and a repeat aspirate off antibiotics with repeat cell count differential and culture may be helpful.  Left great toe cyanosis: He has had prior ABIs and TBI's in November that were reassuring.  I am going to refer him to vascular surgery to evaluate this area.  I spent 44 minutes with the patient including face to face counseling of the patient his wife regarding the nature of his osteomyelitis bacteremia, prior cellulitis his Baker's cyst his knee effusion his cyanotic toe personally reviewing MRI of the left lower extremity performed in October, fluid analysis from aspirate of the knee from October 2022, along with review of medical records before and during the visit and in coordination of his care.

## 2021-10-07 ENCOUNTER — Telehealth: Payer: Self-pay

## 2021-10-07 LAB — BASIC METABOLIC PANEL WITH GFR
BUN: 12 mg/dL (ref 7–25)
CO2: 23 mmol/L (ref 20–32)
Calcium: 8.4 mg/dL — ABNORMAL LOW (ref 8.6–10.3)
Chloride: 97 mmol/L — ABNORMAL LOW (ref 98–110)
Creat: 1.32 mg/dL (ref 0.70–1.35)
Glucose, Bld: 120 mg/dL — ABNORMAL HIGH (ref 65–99)
Potassium: 4.2 mmol/L (ref 3.5–5.3)
Sodium: 133 mmol/L — ABNORMAL LOW (ref 135–146)
eGFR: 61 mL/min/{1.73_m2} (ref >=60)

## 2021-10-07 LAB — CBC WITH DIFFERENTIAL/PLATELET
Absolute Monocytes: 855 cells/uL (ref 200–950)
Basophils Absolute: 44 cells/uL (ref 0–200)
Basophils Relative: 0.4 %
Eosinophils Absolute: 89 cells/uL (ref 15–500)
Eosinophils Relative: 0.8 %
HCT: 33 % — ABNORMAL LOW (ref 38.5–50.0)
Hemoglobin: 10.3 g/dL — ABNORMAL LOW (ref 13.2–17.1)
Lymphs Abs: 1698 cells/uL (ref 850–3900)
MCH: 25.5 pg — ABNORMAL LOW (ref 27.0–33.0)
MCHC: 31.2 g/dL — ABNORMAL LOW (ref 32.0–36.0)
MCV: 81.7 fL (ref 80.0–100.0)
MPV: 10.5 fL (ref 7.5–12.5)
Monocytes Relative: 7.7 %
Neutro Abs: 8414 cells/uL — ABNORMAL HIGH (ref 1500–7800)
Neutrophils Relative %: 75.8 %
Platelets: 390 10*3/uL (ref 140–400)
RBC: 4.04 10*6/uL — ABNORMAL LOW (ref 4.20–5.80)
RDW: 14.6 % (ref 11.0–15.0)
Total Lymphocyte: 15.3 %
WBC: 11.1 10*3/uL — ABNORMAL HIGH (ref 3.8–10.8)

## 2021-10-07 LAB — SEDIMENTATION RATE: Sed Rate: 58 mm/h — ABNORMAL HIGH (ref 0–20)

## 2021-10-07 LAB — C-REACTIVE PROTEIN: CRP: 28.1 mg/L — ABNORMAL HIGH (ref <8.0)

## 2021-10-07 NOTE — Telephone Encounter (Signed)
-----  Message from Truman Hayward, MD sent at 10/07/2021  8:31 AM EST ----- His ESR has actually gone up. I would like him to continue Augmentin and see me in followup> I think  he has one in December

## 2021-10-07 NOTE — Telephone Encounter (Signed)
Informed patient that ESR test has elevated and to continue taking Augmentin. Patient scheduled for follow up on 12/23 Patient verbalized his understanding.    [place], CMA

## 2021-10-10 ENCOUNTER — Telehealth: Payer: Self-pay | Admitting: Orthopaedic Surgery

## 2021-10-10 ENCOUNTER — Ambulatory Visit (INDEPENDENT_AMBULATORY_CARE_PROVIDER_SITE_OTHER): Payer: 59 | Admitting: Podiatry

## 2021-10-10 ENCOUNTER — Other Ambulatory Visit: Payer: Self-pay

## 2021-10-10 VITALS — Temp 99.1°F

## 2021-10-10 DIAGNOSIS — Z899 Acquired absence of limb, unspecified: Secondary | ICD-10-CM

## 2021-10-10 DIAGNOSIS — L97512 Non-pressure chronic ulcer of other part of right foot with fat layer exposed: Secondary | ICD-10-CM

## 2021-10-10 DIAGNOSIS — E1149 Type 2 diabetes mellitus with other diabetic neurological complication: Secondary | ICD-10-CM

## 2021-10-10 NOTE — Telephone Encounter (Signed)
Pt called and was wondering where we are with scheduling his surgery?   CB [phone]

## 2021-10-16 NOTE — Progress Notes (Signed)
Subjective: [name] [name] is a 65 y.o. is seen today in office s/p right partial first amputation, partial second toe amputation, I&D.  His wife is to continue to change the bandage and thinks the wound is healing well.  No increase in swelling or redness or any drainage.  Denies any fevers or chills.  Has no other concerns today.    In the process of being scheduled to have the cyst removed from the left leg.    Sed rate, CRP is increased.  Per Dr. Clinton [name] note status post amputation of first toe and partial amputation of second toe with Streptococcus intermedius and group B strep isolated on culture from the OR Beta lactamase + prevotella along with Streptococcus intermedius in the blood    We will have him complete his IV ceftriaxone and oral flagyl through 09/08/2021   We will then have him start oral augmentin 875/125 mg po BID and see him in followup in November  Objective: General: No acute distress, AAOx3  DP/PT pulses palpable 2/4, CRT < 3 sec to all digits.  Right foot: Scab is present along the amputation site.  The wound was present dorsal aspect the foot is healed.  Incision site with a granular wound base.  Appears to be doing substantially better compared to last appointment.  No increasing edema, erythema to the foot.  There is no fluctuation or crepitation.  There is no malodor.        Assessment and Plan:  Status post right partial first ray, partial second toe amputation, I&D, doing well with no complications; left foot ulcer  -Treatment options discussed including all alternatives, risks, and complications -Wound on the right side is doing much better.  His sed rate, CRP is increased but the foot clinically is doing much better.  Continue daily dressing changes, antibiotics per infectious disease.  He can be weight-bear as tolerated with surgical shoe however has not been able to weight-bear much because of the left leg. -Monitor for any clinical signs or symptoms  of infection and directed to call the office immediately should any occur or go to the ER.  [name] [name] DPM

## 2021-10-18 ENCOUNTER — Encounter: Payer: Self-pay | Admitting: Orthopaedic Surgery

## 2021-10-19 ENCOUNTER — Encounter: Payer: 59 | Admitting: Vascular Surgery

## 2021-10-20 ENCOUNTER — Other Ambulatory Visit: Payer: Self-pay

## 2021-10-20 ENCOUNTER — Ambulatory Visit (INDEPENDENT_AMBULATORY_CARE_PROVIDER_SITE_OTHER): Payer: 59 | Admitting: Podiatry

## 2021-10-20 VITALS — Temp 98.4°F

## 2021-10-20 DIAGNOSIS — L97512 Non-pressure chronic ulcer of other part of right foot with fat layer exposed: Secondary | ICD-10-CM

## 2021-10-20 DIAGNOSIS — E1149 Type 2 diabetes mellitus with other diabetic neurological complication: Secondary | ICD-10-CM

## 2021-10-20 DIAGNOSIS — Z899 Acquired absence of limb, unspecified: Secondary | ICD-10-CM

## 2021-10-23 NOTE — Progress Notes (Signed)
Subjective: [name] [name] is a 65 y.o. is seen today in office s/p right partial first amputation, partial second toe amputation, I&D.  His wife is continue with Santyl dressing changes.  Leaving the wounds are healing well.  No increase in swelling or redness.  No drainage or pus.  No fevers or chills.  He has no other concerns.  Still taking Augmentin.  Scheduled for left leg cyst removal.  Objective: General: No acute distress, AAOx3  DP/PT pulses palpable 2/4, CRT < 3 sec to all digits.  Right foot: Scab is present along the amputation site which I debrided today and only small superficial wounds are still present the distal portion.  There is no probing, undermining or tunneling.  There is no surrounding erythema, ascending cellulitis.  No fluctuation crepitation.  Malodor.  The wound on the dorsal foot is healed.  Prominence present of the medial aspect of the metatarsal. Left: Wound at the distal portion of toe is healed.  There is some purple discoloration which is likely more from the scarring.  There is no skin breakdown or warmth.  No erythema.  Assessment and Plan:  Status post right partial first ray, partial second toe amputation, I&D, doing well with no complications; left foot ulcer  -Treatment options discussed including all alternatives, risks, and complications -Debrided some of the scab to the end complications or bleeding.  Continue with Santyl dressing changes daily.  Continue with offloading.  I discussed with him at some point surgery in order to help limit the prominence of the first metatarsal other things will cause issues to get back into regular shoe.  We will likely do this after he recovers from the left leg surgery. -Monitor for any clinical signs or symptoms of infection and directed to call the office immediately should any occur or go to the ER.  Vivi Barrack DPM

## 2021-10-25 NOTE — Progress Notes (Signed)
   10/25/21 1509  OBSTRUCTIVE SLEEP APNEA  Have you ever been diagnosed with sleep apnea through a sleep study? No  Do you snore loudly (loud enough to be heard through closed doors)?  1  Do you often feel tired, fatigued, or sleepy during the daytime (such as falling asleep during driving or talking to someone)? 0  Has anyone observed you stop breathing during your sleep? 1  Do you have, or are you being treated for high blood pressure? 1  BMI more than 35 kg/m2? 1  Age > 50 (1-yes) 1  Male Gender (Yes=1) 1  Obstructive Sleep Apnea Score 6

## 2021-10-26 ENCOUNTER — Other Ambulatory Visit: Payer: Self-pay

## 2021-10-26 ENCOUNTER — Encounter (HOSPITAL_BASED_OUTPATIENT_CLINIC_OR_DEPARTMENT_OTHER): Payer: Self-pay | Admitting: Orthopaedic Surgery

## 2021-10-28 ENCOUNTER — Encounter (HOSPITAL_BASED_OUTPATIENT_CLINIC_OR_DEPARTMENT_OTHER)
Admission: RE | Admit: 2021-10-28 | Discharge: 2021-10-28 | Disposition: A | Discharge status: Home / Self Care | Payer: 59 | Source: Ambulatory Visit | Attending: Orthopaedic Surgery | Admitting: Orthopaedic Surgery

## 2021-10-28 DIAGNOSIS — M869 Osteomyelitis, unspecified: Secondary | ICD-10-CM | POA: Diagnosis not present

## 2021-10-28 DIAGNOSIS — Z01812 Encounter for preprocedural laboratory examination: Secondary | ICD-10-CM | POA: Insufficient documentation

## 2021-10-28 LAB — BASIC METABOLIC PANEL
Anion gap: 11 (ref 5–15)
BUN: 12 mg/dL (ref 8–23)
CO2: 22 mmol/L (ref 22–32)
Calcium: 9.2 mg/dL (ref 8.9–10.3)
Chloride: 96 mmol/L — ABNORMAL LOW (ref 98–111)
Creatinine, Ser: 1.37 mg/dL — ABNORMAL HIGH (ref 0.61–1.24)
GFR, Estimated: 58 mL/min — ABNORMAL LOW (ref >60)
Glucose, Bld: 84 mg/dL (ref 70–99)
Potassium: 4.3 mmol/L (ref 3.5–5.1)
Sodium: 129 mmol/L — ABNORMAL LOW (ref 135–145)

## 2021-10-28 NOTE — Progress Notes (Signed)

## 2021-10-31 ENCOUNTER — Encounter: Payer: Self-pay | Admitting: Podiatry

## 2021-10-31 ENCOUNTER — Other Ambulatory Visit: Payer: Self-pay

## 2021-10-31 ENCOUNTER — Ambulatory Visit: Payer: 59 | Admitting: Podiatry

## 2021-10-31 DIAGNOSIS — S98111A Complete traumatic amputation of right great toe, initial encounter: Secondary | ICD-10-CM | POA: Insufficient documentation

## 2021-10-31 DIAGNOSIS — Z89419 Acquired absence of unspecified great toe: Secondary | ICD-10-CM | POA: Insufficient documentation

## 2021-10-31 DIAGNOSIS — L97512 Non-pressure chronic ulcer of other part of right foot with fat layer exposed: Secondary | ICD-10-CM | POA: Diagnosis not present

## 2021-10-31 DIAGNOSIS — F419 Anxiety disorder, unspecified: Secondary | ICD-10-CM | POA: Insufficient documentation

## 2021-10-31 DIAGNOSIS — D72829 Elevated white blood cell count, unspecified: Secondary | ICD-10-CM | POA: Insufficient documentation

## 2021-10-31 DIAGNOSIS — E291 Testicular hypofunction: Secondary | ICD-10-CM | POA: Insufficient documentation

## 2021-10-31 DIAGNOSIS — N3289 Other specified disorders of bladder: Secondary | ICD-10-CM | POA: Insufficient documentation

## 2021-10-31 DIAGNOSIS — N1832 Chronic kidney disease, stage 3b: Secondary | ICD-10-CM | POA: Insufficient documentation

## 2021-10-31 DIAGNOSIS — E278 Other specified disorders of adrenal gland: Secondary | ICD-10-CM | POA: Insufficient documentation

## 2021-10-31 DIAGNOSIS — Z794 Long term (current) use of insulin: Secondary | ICD-10-CM | POA: Insufficient documentation

## 2021-10-31 DIAGNOSIS — E1142 Type 2 diabetes mellitus with diabetic polyneuropathy: Secondary | ICD-10-CM | POA: Insufficient documentation

## 2021-10-31 DIAGNOSIS — E1165 Type 2 diabetes mellitus with hyperglycemia: Secondary | ICD-10-CM | POA: Insufficient documentation

## 2021-10-31 DIAGNOSIS — E1121 Type 2 diabetes mellitus with diabetic nephropathy: Secondary | ICD-10-CM | POA: Insufficient documentation

## 2021-10-31 DIAGNOSIS — K219 Gastro-esophageal reflux disease without esophagitis: Secondary | ICD-10-CM | POA: Insufficient documentation

## 2021-10-31 DIAGNOSIS — I129 Hypertensive chronic kidney disease with stage 1 through stage 4 chronic kidney disease, or unspecified chronic kidney disease: Secondary | ICD-10-CM | POA: Insufficient documentation

## 2021-10-31 DIAGNOSIS — G4733 Obstructive sleep apnea (adult) (pediatric): Secondary | ICD-10-CM | POA: Insufficient documentation

## 2021-11-01 NOTE — Progress Notes (Signed)
Subjective: [name] [name] is a 65 y.o. is seen today in office s/p right partial first amputation, partial second toe amputation, I&D.  States the procedure sites are doing very well.  His wife is continue with Santyl dressing changes.  He has no new concerns today.  No swelling or redness that he reports.  He is scheduled for left calf surgery on Wednesday.  Denies any fevers or chills.    Objective: General: No acute distress, AAOx3  DP/PT pulses palpable 2/4, CRT < 3 sec to all digits.  Right foot: Incisions from the prior surgery are well-healed and scars forming.  There is prominence of the medial aspect of the first metatarsal resulting in hyperkeratotic lesion.  Upon debridement there is superficial skin breakdown there is no exposed bone or tendon.  There is no surrounding erythema, ascending tenderness.  No fluctuation crepitation there is no malodor. Left: Wound at the distal portion of toe is healed.  There is some purple discoloration which is likely more from the scarring.  There is no skin breakdown or warmth.  No erythema.  Assessment and Plan:  Status post right partial first ray, partial second toe amputation, I&D, doing well with no complications; left foot ulcer  -Treatment options discussed including all alternatives, risks, and complications -Incisions well-healed at this time.  Calluses present over the prominence of the first metatarsal remnant.  Superficial area skin breakdown.  Recommend a small mount of Betadine to this area daily.  Offloading pads were dispensed.  Discussed with him once he recovers from the left Surgery we will do a surgery on the right foot to remove the bony prominence to help prevent any further issues on the right foot.  Otherwise the right foot is healed.  Surgical shoe dispensed today as opposed to Darco wedge shoe. -Monitor for any clinical signs or symptoms of infection and directed to call the office immediately should any occur or go to the  ER.  Return in about 2 weeks (around 11/14/2021).

## 2021-11-02 ENCOUNTER — Ambulatory Visit (HOSPITAL_BASED_OUTPATIENT_CLINIC_OR_DEPARTMENT_OTHER)
Admission: RE | Admit: 2021-11-02 | Discharge: 2021-11-02 | Disposition: A | Discharge status: Home / Self Care | Payer: 59 | Attending: Orthopaedic Surgery | Admitting: Orthopaedic Surgery

## 2021-11-02 ENCOUNTER — Encounter (HOSPITAL_BASED_OUTPATIENT_CLINIC_OR_DEPARTMENT_OTHER): Payer: Self-pay | Admitting: Orthopaedic Surgery

## 2021-11-02 ENCOUNTER — Encounter (HOSPITAL_BASED_OUTPATIENT_CLINIC_OR_DEPARTMENT_OTHER)
Admission: RE | Disposition: A | Discharge status: Home / Self Care | Payer: Self-pay | Source: Home / Self Care | Attending: Orthopaedic Surgery

## 2021-11-02 ENCOUNTER — Ambulatory Visit (HOSPITAL_BASED_OUTPATIENT_CLINIC_OR_DEPARTMENT_OTHER): Payer: 59 | Admitting: Certified Registered"

## 2021-11-02 ENCOUNTER — Other Ambulatory Visit: Payer: Self-pay

## 2021-11-02 DIAGNOSIS — R2242 Localized swelling, mass and lump, left lower limb: Secondary | ICD-10-CM

## 2021-11-02 DIAGNOSIS — E0843 Diabetes mellitus due to underlying condition with diabetic autonomic (poly)neuropathy: Secondary | ICD-10-CM

## 2021-11-02 DIAGNOSIS — M7122 Synovial cyst of popliteal space [Baker], left knee: Secondary | ICD-10-CM | POA: Insufficient documentation

## 2021-11-02 HISTORY — PX: CYST EXCISION: SHX5701

## 2021-11-02 HISTORY — DX: Depression, unspecified: F32.A

## 2021-11-02 LAB — GLUCOSE, CAPILLARY
Glucose-Capillary: 96 mg/dL (ref 70–99)
Glucose-Capillary: 91 mg/dL (ref 70–99)

## 2021-11-02 SURGERY — CYST REMOVAL
Anesthesia: General | Site: Leg Lower | Laterality: Left

## 2021-11-02 MED ORDER — MIDAZOLAM HCL 2 MG/2ML IJ SOLN
INTRAMUSCULAR | Status: AC
  Filled 2021-11-02: qty 2

## 2021-11-02 MED ORDER — OXYCODONE HCL 5 MG PO TABS: 5.0000 mg | ORAL_TABLET | Freq: Once | ORAL | Status: DC | PRN

## 2021-11-02 MED ORDER — PROMETHAZINE HCL 25 MG/ML IJ SOLN: 6.2500 mg | INTRAMUSCULAR | Status: DC | PRN

## 2021-11-02 MED ORDER — ACETAMINOPHEN 160 MG/5ML PO SOLN: 325.0000 mg | ORAL | Status: DC | PRN

## 2021-11-02 MED ORDER — CEFAZOLIN SODIUM-DEXTROSE 2-4 GM/100ML-% IV SOLN
2.0000 g | INTRAVENOUS | Status: AC
  Administered 2021-11-02: 14:00:00 2 g via INTRAVENOUS

## 2021-11-02 MED ORDER — PROPOFOL 10 MG/ML IV BOLUS
INTRAVENOUS | Status: AC
  Filled 2021-11-02: qty 20

## 2021-11-02 MED ORDER — DEXAMETHASONE SODIUM PHOSPHATE 10 MG/ML IJ SOLN
INTRAMUSCULAR | Status: DC | PRN
  Administered 2021-11-02: 4 mg via INTRAVENOUS

## 2021-11-02 MED ORDER — FENTANYL CITRATE (PF) 100 MCG/2ML IJ SOLN: 25.0000 ug | INTRAMUSCULAR | Status: DC | PRN

## 2021-11-02 MED ORDER — FENTANYL CITRATE (PF) 100 MCG/2ML IJ SOLN
INTRAMUSCULAR | Status: AC
  Filled 2021-11-02: qty 2

## 2021-11-02 MED ORDER — OXYCODONE HCL 5 MG/5ML PO SOLN: 5.0000 mg | Freq: Once | ORAL | Status: DC | PRN

## 2021-11-02 MED ORDER — PHENYLEPHRINE HCL (PRESSORS) 10 MG/ML IV SOLN
INTRAVENOUS | Status: DC | PRN
  Administered 2021-11-02 (×4): 80 ug via INTRAVENOUS

## 2021-11-02 MED ORDER — LIDOCAINE HCL (CARDIAC) PF 100 MG/5ML IV SOSY
PREFILLED_SYRINGE | INTRAVENOUS | Status: DC | PRN
  Administered 2021-11-02: 60 mg via INTRAVENOUS

## 2021-11-02 MED ORDER — PROPOFOL 10 MG/ML IV BOLUS
INTRAVENOUS | Status: DC | PRN
  Administered 2021-11-02: 200 mg via INTRAVENOUS

## 2021-11-02 MED ORDER — FENTANYL CITRATE (PF) 100 MCG/2ML IJ SOLN
INTRAMUSCULAR | Status: DC | PRN
  Administered 2021-11-02: 25 ug via INTRAVENOUS
  Administered 2021-11-02: 50 ug via INTRAVENOUS

## 2021-11-02 MED ORDER — LACTATED RINGERS IV SOLN: INTRAVENOUS | Status: DC

## 2021-11-02 MED ORDER — SODIUM CHLORIDE 0.9 % IR SOLN
Status: DC | PRN
  Administered 2021-11-02: 6000 mL

## 2021-11-02 MED ORDER — EPHEDRINE SULFATE 50 MG/ML IJ SOLN
INTRAMUSCULAR | Status: DC | PRN
  Administered 2021-11-02 (×3): 10 mg via INTRAVENOUS

## 2021-11-02 MED ORDER — HYDROCODONE-ACETAMINOPHEN 5-325 MG PO TABS: 1.0000 | ORAL_TABLET | Freq: Four times a day (QID) | ORAL | 0 refills | Status: DC | PRN

## 2021-11-02 MED ORDER — ACETAMINOPHEN 325 MG PO TABS: 325.0000 mg | ORAL_TABLET | ORAL | Status: DC | PRN

## 2021-11-02 MED ORDER — CEFAZOLIN SODIUM-DEXTROSE 2-4 GM/100ML-% IV SOLN
INTRAVENOUS | Status: AC
  Filled 2021-11-02: qty 100

## 2021-11-02 MED ORDER — AMISULPRIDE (ANTIEMETIC) 5 MG/2ML IV SOLN: 10.0000 mg | Freq: Once | INTRAVENOUS | Status: DC | PRN

## 2021-11-02 MED ORDER — ACETAMINOPHEN 10 MG/ML IV SOLN: 1000.0000 mg | Freq: Once | INTRAVENOUS | Status: DC | PRN

## 2021-11-02 MED ORDER — ONDANSETRON HCL 4 MG/2ML IJ SOLN
INTRAMUSCULAR | Status: DC | PRN
  Administered 2021-11-02: 4 mg via INTRAVENOUS

## 2021-11-02 MED ORDER — MIDAZOLAM HCL 5 MG/5ML IJ SOLN
INTRAMUSCULAR | Status: DC | PRN
  Administered 2021-11-02: 2 mg via INTRAVENOUS

## 2021-11-02 MED ORDER — VANCOMYCIN HCL 1000 MG IV SOLR
INTRAVENOUS | Status: DC | PRN
  Administered 2021-11-02: 1000 mg via TOPICAL

## 2021-11-02 SURGICAL SUPPLY — 56 items
BANDAGE ESMARK 6X9 LF (GAUZE/BANDAGES/DRESSINGS) ×1 IMPLANT
BLADE SURG 15 STRL LF DISP TIS (BLADE) ×2 IMPLANT
BLADE SURG 15 STRL SS (BLADE) ×4
BNDG CMPR 9X4 STRL LF SNTH (GAUZE/BANDAGES/DRESSINGS)
BNDG CMPR 9X6 STRL LF SNTH (GAUZE/BANDAGES/DRESSINGS) ×1
BNDG CMPR MED 10X6 ELC LF (GAUZE/BANDAGES/DRESSINGS) ×1
BNDG COHESIVE 4X5 TAN ST LF (GAUZE/BANDAGES/DRESSINGS) ×2 IMPLANT
BNDG ELASTIC 4X5.8 VLCR STR LF (GAUZE/BANDAGES/DRESSINGS) ×2 IMPLANT
BNDG ELASTIC 6X10 VLCR STRL LF (GAUZE/BANDAGES/DRESSINGS) ×1 IMPLANT
BNDG ESMARK 4X9 LF (GAUZE/BANDAGES/DRESSINGS) ×1 IMPLANT
BNDG ESMARK 6X9 LF (GAUZE/BANDAGES/DRESSINGS) ×2
BRUSH SCRUB EZ PLAIN DRY (MISCELLANEOUS) ×2 IMPLANT
CORD BIPOLAR FORCEPS 12FT (ELECTRODE) IMPLANT
COVER BACK TABLE 60X90IN (DRAPES) ×2 IMPLANT
COVER MAYO STAND STRL (DRAPES) ×2 IMPLANT
DRAIN JP 10F RND SILICONE (MISCELLANEOUS) ×1 IMPLANT
DRAPE EXTREMITY T 121X128X90 (DISPOSABLE) ×2 IMPLANT
DRAPE IMP U-DRAPE 54X76 (DRAPES) ×2 IMPLANT
DRAPE SURG 17X23 STRL (DRAPES) ×1 IMPLANT
DRAPE U-SHAPE 47X51 STRL (DRAPES) ×1 IMPLANT
DRSG PAD ABDOMINAL 8X10 ST (GAUZE/BANDAGES/DRESSINGS) ×2 IMPLANT
DURAPREP 26ML APPLICATOR (WOUND CARE) ×2 IMPLANT
ELECT REM PT RETURN 9FT ADLT (ELECTROSURGICAL) ×2
ELECTRODE REM PT RTRN 9FT ADLT (ELECTROSURGICAL) ×1 IMPLANT
EVACUATOR SILICONE 100CC (DRAIN) ×1 IMPLANT
GAUZE SPONGE 4X4 12PLY STRL (GAUZE/BANDAGES/DRESSINGS) ×2 IMPLANT
GAUZE XEROFORM 1X8 LF (GAUZE/BANDAGES/DRESSINGS) ×2 IMPLANT
GLOVE SURG NEOP MICRO LF SZ7.5 (GLOVE) ×2 IMPLANT
GLOVE SURG SYN 7.5  E (GLOVE) ×6
GLOVE SURG SYN 7.5 E (GLOVE) ×3 IMPLANT
GLOVE SURG SYN 7.5 PF PI (GLOVE) ×2 IMPLANT
GLOVE SURG UNDER POLY LF SZ7 (GLOVE) ×7 IMPLANT
GLOVE SURG UNDER POLY LF SZ7.5 (GLOVE) ×2 IMPLANT
GOWN STRL REIN XL XLG (GOWN DISPOSABLE) ×3 IMPLANT
GOWN STRL REUS W/ TWL LRG LVL3 (GOWN DISPOSABLE) ×1 IMPLANT
GOWN STRL REUS W/ TWL XL LVL3 (GOWN DISPOSABLE) ×2 IMPLANT
GOWN STRL REUS W/TWL LRG LVL3 (GOWN DISPOSABLE) ×4
GOWN STRL REUS W/TWL XL LVL3 (GOWN DISPOSABLE)
MANIFOLD NEPTUNE II (INSTRUMENTS) ×1 IMPLANT
NS IRRIG 1000ML POUR BTL (IV SOLUTION) ×1 IMPLANT
PACK BASIN DAY SURGERY FS (CUSTOM PROCEDURE TRAY) ×2 IMPLANT
PAD CAST 4YDX4 CTTN HI CHSV (CAST SUPPLIES) ×1 IMPLANT
PADDING CAST COTTON 4X4 STRL (CAST SUPPLIES) ×2
PENCIL SMOKE EVACUATOR (MISCELLANEOUS) ×2 IMPLANT
SET IRRIG Y TYPE TUR BLADDER L (SET/KITS/TRAYS/PACK) ×1 IMPLANT
SHEET MEDIUM DRAPE 40X70 STRL (DRAPES) ×2 IMPLANT
SPONGE T-LAP 18X18 ~~LOC~~+RFID (SPONGE) ×2 IMPLANT
SPONGE T-LAP 4X18 ~~LOC~~+RFID (SPONGE) ×2 IMPLANT
STOCKINETTE 4X48 STRL (DRAPES) IMPLANT
STOCKINETTE IMPERVIOUS LG (DRAPES) ×1 IMPLANT
STOCKINETTE TUBULAR 6 INCH (GAUZE/BANDAGES/DRESSINGS) ×2 IMPLANT
SUT ETHILON 2 0 FS 18 (SUTURE) ×3 IMPLANT
SWAB COLLECTION DEVICE MRSA (MISCELLANEOUS) ×1 IMPLANT
SWAB CULTURE ESWAB REG 1ML (MISCELLANEOUS) ×1 IMPLANT
TOWEL GREEN STERILE FF (TOWEL DISPOSABLE) ×3 IMPLANT
TRAY DSU PREP LF (CUSTOM PROCEDURE TRAY) ×2 IMPLANT

## 2021-11-02 NOTE — Anesthesia Preprocedure Evaluation (Addendum)
Anesthesia Evaluation  Patient identified by MRN, date of birth, ID band Patient awake    Reviewed: Allergy & Precautions, NPO status , Patient's Chart, lab work & pertinent test results, reviewed documented beta blocker date and time   Airway Mallampati: I  TM Distance: >3 FB Neck ROM: Full    Dental  (+) Teeth Intact, Chipped,    Pulmonary sleep apnea and Continuous Positive Airway Pressure Ventilation ,    breath sounds clear to auscultation       Cardiovascular hypertension, Pt. on medications and Pt. on home beta blockers + CAD and + CABG   Rhythm:Regular Rate:Normal  Echo: 1. Left ventricular ejection fraction, by estimation, is 55 to 60%. The  left ventricle has normal function. The left ventricle has no regional  wall motion abnormalities. There is mild left ventricular hypertrophy.  Left ventricular diastolic parameters  are consistent with Grade I diastolic dysfunction (impaired relaxation).  2. Right ventricular systolic function is normal. The right ventricular  size is normal. Tricuspid regurgitation signal is inadequate for assessing  PA pressure.  3. The mitral valve is grossly normal. Trivial mitral valve  regurgitation. No evidence of mitral stenosis.  4. The aortic valve is tricuspid. Aortic valve regurgitation is not  visualized. No aortic stenosis is present.  5. The inferior vena cava is normal in size with greater than 50%  respiratory variability, suggesting right atrial pressure of 3 mmHg.    Neuro/Psych PSYCHIATRIC DISORDERS Anxiety Depression  Neuromuscular disease    GI/Hepatic Neg liver ROS, GERD  Medicated,  Endo/Other  diabetes, Type 2, Oral Hypoglycemic Agents  Renal/GU Renal disease     Musculoskeletal negative musculoskeletal ROS (+)   Abdominal Normal abdominal exam  (+)   Peds  Hematology negative hematology ROS (+)   Anesthesia Other Findings   Reproductive/Obstetrics                             Anesthesia Physical Anesthesia Plan  ASA: 3  Anesthesia Plan: General   Post-op Pain Management:    Induction: Intravenous  PONV Risk Score and Plan:   Airway Management Planned: LMA  Additional Equipment: None  Intra-op Plan:   Post-operative Plan: Extubation in OR  Informed Consent: I have reviewed the patients History and Physical, chart, labs and discussed the procedure including the risks, benefits and alternatives for the proposed anesthesia with the patient or authorized representative who has indicated his/her understanding and acceptance.     Dental advisory given  Plan Discussed with: CRNA  Anesthesia Plan Comments:       Anesthesia Quick Evaluation

## 2021-11-02 NOTE — Op Note (Signed)
Date of Surgery: 11/02/2021  INDICATIONS: [name] [name] is a 64 y.o.-year-old male with a left calf mass that failed conservative management.  Briefly he was a gentleman that developed a diabetic foot infection on the contralateral extremity earlier this year and required a great toe amputation.  He also developed bacteremia during that time.  The patient was placed on antibiotics and has been on them chronically.  He presented to my office with a slowly enlarging posterior calf mass that was very tender and painful due to the pressure.  MRI of this area showed that it was a nonruptured Baker's cyst extending from the popliteal fossa.  I sent the patient to interventional radiology for drainage but they were unable to do so due to the thick nature of the fluid.  Therefore he came back to my office where he elected to move forward with surgical excision.  During this time he had no reports of infection or constitutional symptoms.  PREOPERATIVE DIAGNOSIS: Left calf mass, Baker's cyst  POSTOPERATIVE DIAGNOSIS: Left calf infected Baker's cyst  PROCEDURE: Excisional debridement of left calf abscess including subcutaneous tissue and muscle 700 cm  Debridement type: Excisional Debridement  Side: left  Body Location: lower leg   Tools used for debridement: curette and rongeur  SURGEON: [name] [name], M.D.  ASSIST: Starlyn Skeans [place], [place]; necessary for the timely completion of procedure and due to complexity of procedure.  ANESTHESIA:  general  IV FLUIDS AND URINE: See anesthesia.  ESTIMATED BLOOD LOSS: 50 mL.  IMPLANTS: none  DRAINS: JP drain  COMPLICATIONS: see description of procedure.  DESCRIPTION OF PROCEDURE: The patient was brought to the operating room.  The patient had been signed prior to the procedure and this was documented. The patient had the anesthesia placed by the anesthesiologist.  A time-out was performed to confirm that this was the correct patient, site, side and  location. The patient did receive antibiotics prior to the incision and was re-dosed during the procedure as needed at indicated intervals.  The patient had the operative extremity prepped and draped in the standard surgical fashion.    I made a 5 cm incision on the medial aspect of the actual mass.  Dissection was carried down to the subcutaneous tissue.  Tenotomy scissors were used to enter what I felt was the wall of the cyst.  However upon entering the mass there was return of frank pus.  This was quickly cultured.  Tissue cultures were also taken.  We then evacuated the entire abscess.  We then began our excisional debridement of the abscess using a combination of rongeurs and curette.  The abscess left a large cavity which measured approximately 700 cm.  I debrided all of the subcutaneous tissue and muscle within the cavity.  Once I was happy with the debridement 6 L of normal saline was irrigated through it with cystoscopy tubing.  Hemostasis was obtained.  I then placed vancomycin powder throughout the wound.  A JP drain was placed in the cavity and brought out through the skin.  The surgical incision was closed with interrupted nylon sutures.  Soft compressive dressing was applied.  Patient tolerated procedure well had no immediate complications.  [name] was necessary for opening, closing, retracting, limb positioning and overall facilitation and timely completion of the procedure.  POSTOPERATIVE PLAN: The patient will need to follow-up with me next Tuesday for drain removal.  We will follow-up on the intraoperative cultures.  I will contact the infectious disease team  to notify them of today's events.    Mayra Reel, MD 3:59 PM

## 2021-11-02 NOTE — Anesthesia Postprocedure Evaluation (Signed)
Anesthesia Post Note  Patient: [name] [name]  Procedure(s) Performed: REMOVAL OF LEFT CALF MASS (Left: Leg Lower)     Patient location during evaluation: PACU Anesthesia Type: General Level of consciousness: awake and alert Pain management: pain level controlled Vital Signs Assessment: post-procedure vital signs reviewed and stable Respiratory status: spontaneous breathing, nonlabored ventilation and respiratory function stable Cardiovascular status: blood pressure returned to baseline and stable Postop Assessment: no apparent nausea or vomiting Anesthetic complications: no   No notable events documented.  Last Vitals:  Vitals:   11/02/21 1515 11/02/21 1558  BP: 116/72 135/77  Pulse: 71 76  Resp: 18 20  Temp:  36.7 C  SpO2: 99% 96%    Last Pain:  Vitals:   11/02/21 1558  TempSrc: Oral  PainSc: 0-No pain                 Lowella Curb

## 2021-11-02 NOTE — H&P (Signed)
PREOPERATIVE H&P  Chief Complaint: LEFT CALF MASS  HPI: [name] [name] is a 65 y.o. male who presents for surgical treatment of LEFT CALF MASS.  He denies any changes in medical history.  Past Medical History:  Diagnosis Date   Anxiety    Baker's cyst 10/06/2021   Bulbous urethral stricture    chronic--- hx multiple dilatation's and urolume stent's   CAD (coronary artery disease) 04/ 2011  (previous cardiologist-  dr h. Katrinka Blazing , lov 06/ 2016)  currently followed by pcp   03-03-2010  CABG x 5, LIMA to LAD, left radial artery to OM1,SVG to R1, seqSVG to PDA and PLA   Cyanosis 10/06/2021   Depression    ED (erectile dysfunction)    Essential hypertension, benign    GERD (gastroesophageal reflux disease)    History of pancreatitis 2005   Knee effusion 10/06/2021   Mixed hyperlipidemia    OSA on CPAP    not used recently   S/P CABG x 5 03/03/2010   Type II or unspecified type diabetes mellitus without mention of complication, not stated as uncontrolled    endocrinologist-  dr Sharl Ma   Past Surgical History:  Procedure Laterality Date   AMPUTATION Right 08/09/2021   Procedure: AMPUTATION DIGIT;  Surgeon: Vivi Barrack, DPM;  Location: [facility];  Service: Podiatry;  Laterality: Right;   CARDIAC CATHETERIZATION  03-01-2010   dr Verdis Prime   nuclear stress test --positive moderate ishemia:  severe multivessel CAD w/ total occlusion of pLAD, HG obstrucion of pPDA with supplies collaterals around the apex to the LAD, severe stenosis distal RCA, total occlusion OM1, 75% D1, normal LVF    CORONARY ARTERY BYPASS GRAFT  03-03-2010  dr hendrickson    LIMA to LAD, SVG to RI, left radial artery to OM1, seqSVG  to PDA and PLA   CYSTO W/ DILATATION , LASER INCISION OF URETHRAL STRICTURE  02-25-2005   dr Vernie Ammons  [facility]   CYSTO/  DILATION URETHRAL MEATAL STENOSIS AND DILATION BULBOUS STRICTURE  12-21-2004   dr Vonita Moss  [facility]   CYSTO/ RETROGRADE PYELOGRAM/ DILATATION URETHRAL STRICTURE AND  PLACEMENT UROLUME STENT  08-04-2011    dr Vernie Ammons  [facility]   CYSTOSCOPY WITH DIRECT VISION INTERNAL URETHROTOMY  07/15/2008   dr Vernie Ammons  [facility]   and UroLume Stent placement   CYSTOSCOPY WITH URETHRAL DILATATION N/A 04/19/2018   Procedure: CYSTOSCOPY WITH BALLOON URETHRAL DILATATION;  Surgeon: Ihor Gully, MD;  Location: [facility];  Service: Urology;  Laterality: N/A;   INCISION AND DRAINAGE Right 08/13/2021   Procedure: INCISION AND DRAINAGE RIGHT FOOT WOUND;  Surgeon: Vivi Barrack, DPM;  Location: [facility];  Service: Podiatry;  Laterality: Right;   ORIF TOE FRACTURE Left 05/22/2013   Procedure: OPEN REDUCTION INTERNAL FIXATION (ORIF) LEFT SECOND TOE PROXIMAL PHALANX FRACTURE/LEFT PERCUTANEOUS PINNING;  Surgeon: Toni Arthurs, MD;  Location: [facility];  Service: Orthopedics;  Laterality: Left;   TONSILLECTOMY  child   Social History   Socioeconomic History   Marital status: Married    Spouse name: Not on file   Number of children: Not on file   Years of education: Not on file   Highest education level: Not on file  Occupational History   Not on file  Tobacco Use   Smoking status: Never   Smokeless tobacco: Never  Vaping Use   Vaping Use: Never used  Substance and Sexual Activity   Alcohol use: Yes    Comment: occasional  Drug use: No   Sexual activity: Not Currently  Other Topics Concern   Not on file  Social History Narrative   Not on file   Social Determinants of Health   Financial Resource Strain: Not on file  Food Insecurity: Not on file  Transportation Needs: Not on file  Physical Activity: Not on file  Stress: Not on file  Social Connections: Not on file   Family History  Problem Relation Age of Onset   Cancer Mother        lung   Hypertension Father    CAD Father    CVA Brother    Allergies  Allergen Reactions   Hydrochlorothiazide     Other reaction(s): hyponatremia   Metformin Hcl     Other reaction(s): upset stomach   Prior to Admission  medications   Medication Sig Start Date End Date Taking? Authorizing Provider  ALPRAZolam Prudy Feeler) 0.5 MG tablet Take 0.5 mg by mouth 2 (two) times daily as needed for anxiety.   Yes [provider]  amLODipine (NORVASC) 10 MG tablet Take 10 mg by mouth at bedtime. 06/14/20  Yes [provider]  amoxicillin-clavulanate (AUGMENTIN) 875-125 MG tablet Take 1 tablet by mouth 2 (two) times daily. 09/02/21  Yes Randall Hiss, MD  aspirin 325 MG tablet Take 325 mg by mouth daily.   Yes [provider]  atorvastatin (LIPITOR) 80 MG tablet Take 80 mg by mouth every evening.   Yes [provider]  buPROPion (WELLBUTRIN XL) 300 MG 24 hr tablet Take 300 mg by mouth every morning.    Yes [provider]  collagenase (SANTYL) ointment Apply 1 application topically daily. 09/29/21  Yes Vivi Barrack, DPM  fenofibrate 160 MG tablet Take 160 mg by mouth every evening. 07/01/14  Yes [provider]  fish oil-omega-3 fatty acids 1000 MG capsule Take 1 g by mouth daily.    Yes [provider]  HUMALOG MIX 50/50 KWIKPEN (50-50) 100 UNIT/ML Kwikpen Inject 40 Units into the skin 3 (three) times daily before meals. 07/19/20  Yes [provider]  metoprolol tartrate (LOPRESSOR) 100 MG tablet Take 100 mg by mouth 2 (two) times daily.   Yes [provider]  olmesartan-hydrochlorothiazide (BENICAR HCT) 40-25 MG per tablet Take 1 tablet by mouth every morning.    Yes [provider]  omeprazole (PRILOSEC) 20 MG capsule Take 20 mg by mouth every evening.    Yes [provider]  oxyCODONE (OXY IR/ROXICODONE) 5 MG immediate release tablet Take 1 tablet (5 mg total) by mouth every 4 (four) hours as needed for moderate pain. 08/21/21  Yes Rodolph Bong, MD  OZEMPIC, 1 MG/DOSE, 2 MG/1.5ML SOPN Inject 1 mg into the skin once a week. Friday 03/12/20  Yes [provider]  polyethylene glycol (MIRALAX / GLYCOLAX) 17 g  packet Take 17 g by mouth daily as needed for moderate constipation. 08/15/21  Yes Kathlen Mody, MD  potassium chloride SA (K-DUR,KLOR-CON) 20 MEQ tablet Take 40 mEq by mouth every morning.   Yes [provider]  NOVOFINE 32G X 6 MM MISC  07/01/14   [provider]     Positive ROS: All other systems have been reviewed and were otherwise negative with the exception of those mentioned in the HPI and as above.  Physical Exam: General: Alert, no acute distress Cardiovascular: No pedal edema Respiratory: No cyanosis, no use of accessory musculature GI: abdomen soft Skin: No lesions in the area  of chief complaint Neurologic: Sensation intact distally Psychiatric: Patient is competent for consent with normal mood and affect Lymphatic: no lymphedema  MUSCULOSKELETAL: exam stable  Assessment: LEFT CALF MASS  Plan: Plan for Procedure(s): REMOVAL OF LEFT CALF MASS  The risks benefits and alternatives were discussed with the patient including but not limited to the risks of nonoperative treatment, versus surgical intervention including infection, bleeding, nerve injury,  blood clots, cardiopulmonary complications, morbidity, mortality, among others, and they were willing to proceed.   Preoperative templating of the joint replacement has been completed, documented, and submitted to the Operating Room personnel in order to optimize intra-operative equipment management.   Glee Arvin, MD 11/02/2021 11:11 AM

## 2021-11-02 NOTE — Transfer of Care (Signed)
Immediate Anesthesia Transfer of Care Note  Patient: [name] [name]  Procedure(s) Performed: REMOVAL OF LEFT CALF MASS (Left: Leg Lower)  Patient Location: PACU  Anesthesia Type:General  Level of Consciousness: sedated  Airway & Oxygen Therapy: Patient Spontanous Breathing and Patient connected to face mask oxygen  Post-op Assessment: Report given to RN and Post -op Vital signs reviewed and stable  Post vital signs: Reviewed and stable  Last Vitals:  Vitals Value Taken Time  BP 104/65 11/02/21 1501  Temp    Pulse 71 11/02/21 1504  Resp 14 11/02/21 1504  SpO2 100 % 11/02/21 1504  Vitals shown include unvalidated device data.  Last Pain:  Vitals:   11/02/21 1138  TempSrc: Oral  PainSc: 0-No pain      Patients Stated Pain Goal: 3 (11/02/21 1138)  Complications: No notable events documented.

## 2021-11-02 NOTE — Discharge Instructions (Addendum)
Postoperative instructions:  Weightbearing instructions: as tolerated  Dressing instructions: Keep your dressing and/or splint clean and dry at all times.  It will be removed at your first post-operative appointment.  Your stitches and/or staples will be removed at this visit.  Incision instructions:  Do not soak your incision for 3 weeks after surgery.  If the incision gets wet, pat dry and do not scrub the incision.  Pain control:  You have been given a prescription to be taken as directed for post-operative pain control.  In addition, elevate the operative extremity above the heart at all times to prevent swelling and throbbing pain.  Take over-the-counter Colace, 100mg  by mouth twice a day while taking narcotic pain medications to help prevent constipation.  Follow up appointments: 1) 14 days for suture removal and wound check. 2) Dr. as scheduled.   -------------------------------------------------------------------------------------------------------------  After Surgery Pain Control:  After your surgery, post-surgical discomfort or pain is likely. This discomfort can last several days to a few weeks. At certain times of the day your discomfort may be more intense.  Did you receive a nerve block?  A nerve block can provide pain relief for one hour to two days after your surgery. As long as the nerve block is working, you will experience little or no sensation in the area the surgeon operated on.  As the nerve block wears off, you will begin to experience pain or discomfort. It is very important that you begin taking your prescribed pain medication before the nerve block fully wears off. Treating your pain at the first sign of the block wearing off will ensure your pain is better controlled and more tolerable when full-sensation returns. Do not wait until the pain is intolerable, as the medicine will be less effective. It is better to treat pain in advance than to try and catch up.   General Anesthesia:  If you did not receive a nerve block during your surgery, you will need to start taking your pain medication shortly after your surgery and should continue to do so as prescribed by your surgeon.  Pain Medication:  Most commonly we prescribe Vicodin and Percocet for post-operative pain. Both of these medications contain a combination of acetaminophen (Tylenol) and a narcotic to help control pain.   It takes between 30 and 45 minutes before pain medication starts to work. It is important to take your medication before your pain level gets too intense.   Nausea is a common side effect of many pain medications. You will want to eat something before taking your pain medicine to help prevent nausea.   If you are taking a prescription pain medication that contains acetaminophen, we recommend that you do not take additional over the counter acetaminophen (Tylenol).  Other pain relieving options:   Using a cold pack to ice the affected area a few times a day (15 to 20 minutes at a time) can help to relieve pain, reduce swelling and bruising.   Elevation of the affected area can also help to reduce pain and swelling.  Post Anesthesia Home Care Instructions  Activity: Get plenty of rest for the remainder of the day. A responsible individual must stay with you for 24 hours following the procedure.  For the next 24 hours, DO NOT: -Drive a car -Roda Shutters -Drink alcoholic beverages -Take any medication unless instructed by your physician -Make any legal decisions or sign important papers.  Meals: Start with liquid foods such as gelatin or soup. Progress  to regular foods as tolerated. Avoid greasy, spicy, heavy foods. If nausea and/or vomiting occur, drink only clear liquids until the nausea and/or vomiting subsides. Call your physician if vomiting continues.  Special Instructions/Symptoms: Your throat may feel dry or sore from the anesthesia or the breathing tube  placed in your throat during surgery. If this causes discomfort, gargle with warm salt water. The discomfort should disappear within 24 hours.  If you had a scopolamine patch placed behind your ear for the management of post- operative nausea and/or vomiting:  1. The medication in the patch is effective for 72 hours, after which it should be removed.  Wrap patch in a tissue and discard in the trash. Wash hands thoroughly with soap and water. 2. You may remove the patch earlier than 72 hours if you experience unpleasant side effects which may include dry mouth, dizziness or visual disturbances. 3. Avoid touching the patch. Wash your hands with soap and water after contact with the patch.        JP Drain [employer] this sheet to all of your post-operative appointments while you have your drains. Please measure your drains by CC's or ML's. Make sure you drain and measure your JP Drains 2 or 3 times per day. At the end of each day, add up totals for the left side and add up totals for the right side.    ( 9 am )     ( 3 pm )        ( 9 pm )                Date L  R  L  R  L  R  Total L/R

## 2021-11-02 NOTE — Anesthesia Procedure Notes (Deleted)
Procedure Name: LMA Insertion Date/Time: 11/02/2021 1:52 PM Performed by: Burna Cash, CRNA Pre-anesthesia Checklist: Patient identified, Emergency Drugs available, Suction available and Patient being monitored Patient Re-evaluated:Patient Re-evaluated prior to induction Oxygen Delivery Method: Circle system utilized Preoxygenation: Pre-oxygenation with 100% oxygen Induction Type: IV induction Ventilation: Mask ventilation without difficulty LMA: LMA inserted LMA Size: 5.0 Number of attempts: 1 Airway Equipment and Method: Bite block Placement Confirmation: positive ETCO2 Tube secured with: Tape Dental Injury: Teeth and Oropharynx as per pre-operative assessment

## 2021-11-02 NOTE — Anesthesia Procedure Notes (Addendum)
Procedure Name: LMA Insertion Date/Time: 11/02/2021 1:52 PM Performed by: Burna Cash, CRNA Pre-anesthesia Checklist: Patient identified, Emergency Drugs available, Suction available and Patient being monitored Patient Re-evaluated:Patient Re-evaluated prior to induction Oxygen Delivery Method: Circle system utilized Preoxygenation: Pre-oxygenation with 100% oxygen Induction Type: IV induction Ventilation: Mask ventilation without difficulty LMA: LMA inserted LMA Size: 5.0 Number of attempts: 1 Airway Equipment and Method: Bite block Placement Confirmation: positive ETCO2 Tube secured with: Tape Dental Injury: Teeth and Oropharynx as per pre-operative assessment

## 2021-11-03 ENCOUNTER — Encounter (HOSPITAL_BASED_OUTPATIENT_CLINIC_OR_DEPARTMENT_OTHER): Payer: Self-pay | Admitting: Orthopaedic Surgery

## 2021-11-05 LAB — ACID FAST SMEAR (AFB, MYCOBACTERIA)
Acid Fast Smear: NEGATIVE
Acid Fast Smear: NEGATIVE

## 2021-11-07 ENCOUNTER — Other Ambulatory Visit: Payer: Self-pay | Admitting: Infectious Disease

## 2021-11-07 LAB — AEROBIC/ANAEROBIC CULTURE W GRAM STAIN (SURGICAL/DEEP WOUND): Culture: NO GROWTH

## 2021-11-07 NOTE — Telephone Encounter (Signed)
Appt on 12/23

## 2021-11-09 ENCOUNTER — Ambulatory Visit (INDEPENDENT_AMBULATORY_CARE_PROVIDER_SITE_OTHER): Payer: 59 | Admitting: Orthopaedic Surgery

## 2021-11-09 ENCOUNTER — Other Ambulatory Visit: Payer: Self-pay

## 2021-11-09 ENCOUNTER — Encounter: Payer: Self-pay | Admitting: Orthopaedic Surgery

## 2021-11-09 DIAGNOSIS — L02416 Cutaneous abscess of left lower limb: Secondary | ICD-10-CM

## 2021-11-09 LAB — AEROBIC/ANAEROBIC CULTURE W GRAM STAIN (SURGICAL/DEEP WOUND): Culture: NO GROWTH

## 2021-11-09 MED ORDER — AMOXICILLIN-POT CLAVULANATE 875-125 MG PO TABS
1.0000 | ORAL_TABLET | Freq: Two times a day (BID) | ORAL | 1 refills | Status: DC
Start: 2021-11-09 — End: 2021-11-17

## 2021-11-09 NOTE — Progress Notes (Signed)
Post-Op Visit Note   Patient: [name] [name]           Date of Birth: 04-Aug-1956           MRN: [id number] Visit Date: 11/09/2021 PCP: Tally Joe, MD   Assessment & Plan:  Chief Complaint:  Chief Complaint  Patient presents with   Left Lower Leg - Routine Post Op   Visit Diagnoses:  1. Abscess of left leg     Plan: Patient is a pleasant 65 year old gentleman who comes in today 1 week status post left lower leg excisional debridement of a sterile abscess, date of surgery 11/02/2021.  Intraoperative cultures have been negative to date, however he is being followed by Dr. Algis Liming with infectious disease and is on chronic Augmentin.  He does note that he ran out of this on Monday.  He denies any fevers or chills.  His wife has been emptying his drain.  Examination of the left lower leg reveals a well healing surgical incision with nylon sutures in place.  Drain is intact.  He has continued to experience pain and swelling to the left knee.  At this point, we are concerned that she may have an infection in his knee given the findings from his left calf excisional debridement.  We will go ahead and post him for left knee irrigation debridement next Tuesday and at that point we will plan to remove his left lower leg drain.  He is scheduled to follow-up with Dr. Algis Liming in the near future, but I will go ahead and refill his Augmentin for now.  He will call us with any questions or worsening symptoms.  Follow-Up Instructions: Return in about 1 week (around 11/16/2021).   Orders:  No orders of the defined types were placed in this encounter.  No orders of the defined types were placed in this encounter.   Imaging: No new imaging  PMFS History: Patient Active Problem List   Diagnosis Date Noted   Mass of lower leg, left 11/02/2021   Amputation of right great toe (HCC) 10/31/2021   Anxiety 10/31/2021   Chronic kidney disease, stage 3b (HCC) 10/31/2021   Diabetic renal disease (HCC)  10/31/2021   Gastroesophageal reflux disease without esophagitis 10/31/2021   History of amputation of great toe (HCC) 10/31/2021   Hyperglycemia due to type 2 diabetes mellitus (HCC) 10/31/2021   Leukocytosis 10/31/2021   Long term (current) use of insulin (HCC) 10/31/2021   Malignant hypertensive chronic kidney disease 10/31/2021   Male hypogonadism 10/31/2021   Mass of left adrenal gland (HCC) 10/31/2021   Obstructive sleep apnea syndrome 10/31/2021   Other specified disorders of bladder 10/31/2021   Polyneuropathy due to type 2 diabetes mellitus (HCC) 10/31/2021   Baker's cyst, left 10/06/2021   Knee effusion, left 10/06/2021   Cyanosis 10/06/2021   Hyponatremia    AKI (acute kidney injury) (HCC)    Elevated LFTs    Post-operative state    Cellulitis of left lower extremity    Streptococcal bacteremia    Cellulitis of right lower extremity    Osteomyelitis of great toe of right foot (HCC)    Osteomyelitis of second toe of right foot (HCC)    Diabetes mellitus due to underlying condition with diabetic autonomic neuropathy, without long-term current use of insulin (HCC)    Class 3 severe obesity with serious comorbidity and body mass index (BMI) of 45.0 to 49.9 in adult [facility])    Toe infection 08/08/2021   CAD (coronary  artery disease) of artery bypass graft 04/21/2015   Hyperlipidemia 04/21/2015   Essential hypertension 04/21/2015   Type 2 diabetes mellitus ([place]) 04/21/2015   Ulcer of foot ([place]) 08/22/2014   Past Medical History:  Diagnosis Date   Anxiety    Baker's cyst 10/06/2021   Bulbous urethral stricture    chronic--- hx multiple dilatation's and urolume stent's   CAD (coronary artery disease) 04/ 2011  (previous cardiologist-  dr h. Tamala Julian , lov 06/ 2016)  currently followed by pcp   03-03-2010  CABG x 5, LIMA to LAD, left radial artery to OM1,SVG to R1, seqSVG to PDA and PLA   Cyanosis 10/06/2021   Depression    ED (erectile dysfunction)    Essential hypertension,  benign    GERD (gastroesophageal reflux disease)    History of pancreatitis 2005   Knee effusion 10/06/2021   Mixed hyperlipidemia    OSA on CPAP    not used recently   S/P CABG x 5 03/03/2010   Type II or unspecified type diabetes mellitus without mention of complication, not stated as uncontrolled    endocrinologist-  dr Buddy Duty    Family History  Problem Relation Age of Onset   Cancer Mother        lung   Hypertension Father    CAD Father    CVA Brother     Past Surgical History:  Procedure Laterality Date   AMPUTATION Right 08/09/2021   Procedure: AMPUTATION DIGIT;  Surgeon: Trula Slade, DPM;  Location: [facility];  Service: Podiatry;  Laterality: Right;   CARDIAC CATHETERIZATION  03-01-2010   dr Daneen Schick   nuclear stress test --positive moderate ishemia:  severe multivessel CAD w/ total occlusion of pLAD, HG obstrucion of pPDA with supplies collaterals around the apex to the LAD, severe stenosis distal RCA, total occlusion OM1, 75% D1, normal LVF    CORONARY ARTERY BYPASS GRAFT  03-03-2010  dr hendrickson    LIMA to LAD, SVG to RI, left radial artery to OM1, seqSVG  to PDA and PLA   CYST EXCISION Left 11/02/2021   Procedure: REMOVAL OF LEFT CALF MASS;  Surgeon: Leandrew Koyanagi, MD;  Location: [place];  Service: Orthopedics;  Laterality: Left;   CYSTO W/ DILATATION , LASER INCISION OF URETHRAL STRICTURE  02-25-2005   dr Karsten Ro  [facility]   CYSTO/  DILATION URETHRAL MEATAL STENOSIS AND DILATION BULBOUS STRICTURE  12-21-2004   dr Terance Hart  [facility]   CYSTO/ RETROGRADE PYELOGRAM/ DILATATION URETHRAL STRICTURE AND PLACEMENT UROLUME STENT  08-04-2011    dr Karsten Ro  [facility]   CYSTOSCOPY WITH DIRECT VISION INTERNAL URETHROTOMY  07/15/2008   dr Karsten Ro  [facility]   and UroLume Stent placement   CYSTOSCOPY WITH URETHRAL DILATATION N/A 04/19/2018   Procedure: CYSTOSCOPY WITH BALLOON URETHRAL DILATATION;  Surgeon: Kathie Rhodes, MD;  Location: [facility];  Service:  Urology;  Laterality: N/A;   INCISION AND DRAINAGE Right 08/13/2021   Procedure: INCISION AND DRAINAGE RIGHT FOOT WOUND;  Surgeon: Trula Slade, DPM;  Location[facility];  Service: Podiatry;  Laterality: Right;   ORIF TOE FRACTURE Left 05/22/2013   Procedure: OPEN REDUCTION INTERNAL FIXATION (ORIF) LEFT SECOND TOE PROXIMAL PHALANX FRACTURE/LEFT PERCUTANEOUS PINNING;  Surgeon: Wylene Simmer, MD;  Location: [place];  Service: Orthopedics;  Laterality: Left;   TONSILLECTOMY  child   Social History   Occupational History   Not on file  Tobacco Use   Smoking status: Never   Smokeless  tobacco: Never  Vaping Use   Vaping Use: Never used  Substance and Sexual Activity   Alcohol use: Yes    Comment: occasional   Drug use: No   Sexual activity: Not Currently

## 2021-11-11 ENCOUNTER — Ambulatory Visit (INDEPENDENT_AMBULATORY_CARE_PROVIDER_SITE_OTHER): Payer: 59 | Admitting: Infectious Disease

## 2021-11-11 ENCOUNTER — Ambulatory Visit (INDEPENDENT_AMBULATORY_CARE_PROVIDER_SITE_OTHER): Payer: 59

## 2021-11-11 ENCOUNTER — Encounter (HOSPITAL_COMMUNITY): Payer: Self-pay | Admitting: Orthopaedic Surgery

## 2021-11-11 ENCOUNTER — Other Ambulatory Visit: Payer: Self-pay

## 2021-11-11 ENCOUNTER — Encounter: Payer: Self-pay | Admitting: Infectious Disease

## 2021-11-11 VITALS — BP 150/90 | HR 63 | Temp 98.3°F

## 2021-11-11 DIAGNOSIS — M869 Osteomyelitis, unspecified: Secondary | ICD-10-CM

## 2021-11-11 DIAGNOSIS — M009 Pyogenic arthritis, unspecified: Secondary | ICD-10-CM | POA: Diagnosis not present

## 2021-11-11 DIAGNOSIS — S98111A Complete traumatic amputation of right great toe, initial encounter: Secondary | ICD-10-CM

## 2021-11-11 DIAGNOSIS — Z23 Encounter for immunization: Secondary | ICD-10-CM

## 2021-11-11 DIAGNOSIS — I10 Essential (primary) hypertension: Secondary | ICD-10-CM

## 2021-11-11 DIAGNOSIS — I129 Hypertensive chronic kidney disease with stage 1 through stage 4 chronic kidney disease, or unspecified chronic kidney disease: Secondary | ICD-10-CM

## 2021-11-11 DIAGNOSIS — N1832 Chronic kidney disease, stage 3b: Secondary | ICD-10-CM

## 2021-11-11 DIAGNOSIS — R2242 Localized swelling, mass and lump, left lower limb: Secondary | ICD-10-CM

## 2021-11-11 DIAGNOSIS — M25462 Effusion, left knee: Secondary | ICD-10-CM

## 2021-11-11 DIAGNOSIS — R7881 Bacteremia: Secondary | ICD-10-CM

## 2021-11-11 DIAGNOSIS — B955 Unspecified streptococcus as the cause of diseases classified elsewhere: Secondary | ICD-10-CM

## 2021-11-11 HISTORY — DX: Pyogenic arthritis, unspecified: M00.9

## 2021-11-11 NOTE — Progress Notes (Signed)
Subjective:  Chief complaint: Swelling that persists in his left knee joint he has been  Patient ID: [name] [name], male    DOB: 1956/09/05, 65 y.o.   MRN: [id number]  HPI  65 y.o. male with diabetes mellitus obesity, coronary artery disease who has been struggling with bilateral diabetic foot ulcers now admitted with acute worsening of drainage from his right great toe also with onset of erythema consistent with cellulitis in his left lower extremity.  Plain films of shown osteomyelitis in the still phalanx of the great toe and second toe on the right.   RI shows the ulceration that is obvious on exam with communication of the joint space and first IP joint with septic arthritis and osteomyelitis of first proximal and distal phalanges with pathological fracture of the base of the first distal phalanx with ulceration of the tip of the second toe with underlying osteomyelitis the second distal phalanx tear of the flexor pollicis longus and infectious tenosynovitis with multiple forefoot abscess   He also has an ulcer in his left great toe as well   Fortunately MRI did not show evidence of osteomyelitis in that foot  Patient was taken the operating room and went underwent first toe and partial amputation of second toe.  Cultures grew Streptococcus intermedius and group B streptococcus as well as a beta-lactamase positive Prevotella.  Grew strep intermedius from blood cultures as well.  2D echocardiogram failed to show evidence of endocarditis.  Why was in the hospital I last saw him we had switched him to continuous penicillin along with oral metronidazole.  He ended up changing eventually to ceftriaxone and metronidazole and was discharged home with plans to complete antibiotics on 20 October.  Been followed closely by Dr. Ardelle AntonWagoner with podiatry and who is happy with the progress of the foot.  His inflammatory markers show relative stability of his sed rate which she went been 37 and 44 in  the inpatient world and was 43 when checked on the 10th with home health.  CRP had been elevated at 18.4 in 26 September and has come down since then more recently to 19.  He did have worsening of his renal function with a creatinine having bumped up to 2 on October 10.  He says he saw his primary care doctor  the 13th and had a repeat metabolic panel checked with increased fluid intake and that his kidney function was listed as being normal.  He did have worsening swelling in his left leg where he was f thought to have a Baker's cyst when he was in the hospital.  Cellulitis in the same leg is long since resolved.  We ordered Dopplers of the left lower extremity to exclude deep venous thrombosis and there as a was none.  However the edema worsened and ultimately we deferred him to orthopedic surgery and he saw Dr. Roda ShuttersXu.  He mass had enlarged since then.  Dr. Roda ShuttersXu performed an aspirate which I believe was from the patients knee with blood-tinged fluid isolated which had 6263 white blood cells with 89% neutrophils 3% lymphocytes gram stain and culture were negative though keep in mind the patient has been on antibiotics.  MRI of the leg was obtained which showed a small knee effusion but with extensive synovitis and edema in the joint margins as well as lateral tibial plateau with some erosive changes that radiology felt could be due to septic joint and early osteomyelitis versus an erosive arthropathy.  There is also  medial and lateral meniscal tears, and abnormal ACL with fluid expansion in the area they are compatible with CS cyst and a multilobulated fluid collection posterior to the joint which could be ganglion cyst with also a large Baker's cyst extending down into the calf.  Dr Erlinda Hong performed surgery December 14 with excisional debridement of a large left Calf abscess that was with 700 cm of subcutaneous tissue muscle and pus.  Had culture sent but they did not yield an organism keeping in mind he  has been on Augmentin.  He completed Augmentin but was restarted on by orthopedic surgery.  His knee effusion has persisted and now there is plans for arthroscopic surgery to clean out the joint.  I suspect he does have a septic knee and will need IV antibiotics to treat this.  I have asked him to stop antibiotics today to help increase the yield on cultures though the surgery scheduled for next Tuesday.            Past Medical History:  Diagnosis Date   Anxiety    Baker's cyst 10/06/2021   Bulbous urethral stricture    chronic--- hx multiple dilatation's and urolume stent's   CAD (coronary artery disease) 04/ 2011  (previous cardiologist-  dr h. Tamala Julian , lov 06/ 2016)  currently followed by pcp   03-03-2010  CABG x 5, LIMA to LAD, left radial artery to OM1,SVG to R1, seqSVG to PDA and PLA   Cyanosis 10/06/2021   Depression    ED (erectile dysfunction)    Essential hypertension, benign    GERD (gastroesophageal reflux disease)    History of pancreatitis 2005   Knee effusion 10/06/2021   Mixed hyperlipidemia    OSA on CPAP    not used recently   S/P CABG x 5 03/03/2010   Type II or unspecified type diabetes mellitus without mention of complication, not stated as uncontrolled    endocrinologist-  dr Buddy Duty    Past Surgical History:  Procedure Laterality Date   AMPUTATION Right 08/09/2021   Procedure: AMPUTATION DIGIT;  Surgeon: Trula Slade, DPM;  [facility];  Service: Podiatry;  Laterality: Right;   CARDIAC CATHETERIZATION  03-01-2010   dr Daneen Schick   nuclear stress test --positive moderate ishemia:  severe multivessel CAD w/ total occlusion of pLAD, HG obstrucion of pPDA with supplies collaterals around the apex to the LAD, severe stenosis distal RCA, total occlusion OM1, 75% D1, normal LVF    CORONARY ARTERY BYPASS GRAFT  03-03-2010  dr hendrickson    LIMA to LAD, SVG to RI, left radial artery to OM1, seqSVG  to PDA and PLA   CYST EXCISION Left 11/02/2021    Procedure: REMOVAL OF LEFT CALF MASS;  Surgeon: Leandrew Koyanagi, MD;  Location: [place];  Service: Orthopedics;  Laterality: Left;   CYSTO W/ DILATATION , LASER INCISION OF URETHRAL STRICTURE  02-25-2005   dr Karsten Ro  [facility]   CYSTO/  DILATION URETHRAL MEATAL STENOSIS AND DILATION BULBOUS STRICTURE  12-21-2004   dr Terance Hart  [facility]   CYSTO/ RETROGRADE PYELOGRAM/ DILATATION URETHRAL STRICTURE AND PLACEMENT UROLUME STENT  08-04-2011    dr Karsten Ro  [facility]   CYSTOSCOPY WITH DIRECT VISION INTERNAL URETHROTOMY  07/15/2008   dr Karsten Ro  [facility]   and UroLume Stent placement   CYSTOSCOPY WITH URETHRAL DILATATION N/A 04/19/2018   Procedure: CYSTOSCOPY WITH BALLOON URETHRAL DILATATION;  Surgeon: Kathie Rhodes, MD;  Location: [facility];  Service: Urology;  Laterality: N/A;   INCISION AND DRAINAGE Right 08/13/2021   Procedure: INCISION AND DRAINAGE RIGHT FOOT WOUND;  Surgeon: Vivi Barrack, DPM;  [facility];  Service: Podiatry;  Laterality: Right;   ORIF TOE FRACTURE Left 05/22/2013   Procedure: OPEN REDUCTION INTERNAL FIXATION (ORIF) LEFT SECOND TOE PROXIMAL PHALANX FRACTURE/LEFT PERCUTANEOUS PINNING;  Surgeon: Toni Arthurs, MD;  Location[facility];  Service: Orthopedics;  Laterality: Left;   TONSILLECTOMY  child    Family History  Problem Relation Age of Onset   Cancer Mother        lung   Hypertension Father    CAD Father    CVA Brother       Social History   Socioeconomic History   Marital status: Married    Spouse name: Not on file   Number of children: Not on file   Years of education: Not on file   Highest education level: Not on file  Occupational History   Not on file  Tobacco Use   Smoking status: Never   Smokeless tobacco: Never  Vaping Use   Vaping Use: Never used  Substance and Sexual Activity   Alcohol use: Yes    Comment: occasional   Drug use: No   Sexual activity: Not Currently  Other Topics Concern   Not on file  Social History  Narrative   Not on file   Social Determinants of Health   Financial Resource Strain: Not on file  Food Insecurity: Not on file  Transportation Needs: Not on file  Physical Activity: Not on file  Stress: Not on file  Social Connections: Not on file    Allergies  Allergen Reactions   Hydrochlorothiazide     Other reaction(s): hyponatremia   Metformin Hcl     Other reaction(s): upset stomach     Current Outpatient Medications:    ALPRAZolam (XANAX) 0.5 MG tablet, Take 0.5 mg by mouth 2 (two) times daily as needed for anxiety., Disp: , Rfl:    amLODipine (NORVASC) 10 MG tablet, Take 10 mg by mouth at bedtime., Disp: , Rfl:    amoxicillin-clavulanate (AUGMENTIN) 875-125 MG tablet, Take 1 tablet by mouth 2 (two) times daily., Disp: 60 tablet, Rfl: 1   aspirin 325 MG tablet, Take 325 mg by mouth daily., Disp: , Rfl:    atorvastatin (LIPITOR) 80 MG tablet, Take 80 mg by mouth every evening., Disp: , Rfl:    buPROPion (WELLBUTRIN XL) 300 MG 24 hr tablet, Take 300 mg by mouth every morning. , Disp: , Rfl:    collagenase (SANTYL) ointment, Apply 1 application topically daily., Disp: 90 g, Rfl: 0   fenofibrate 160 MG tablet, Take 160 mg by mouth every evening., Disp: , Rfl:    fish oil-omega-3 fatty acids 1000 MG capsule, Take 1 g by mouth daily. , Disp: , Rfl:    HUMALOG MIX 50/50 KWIKPEN (50-50) 100 UNIT/ML Kwikpen, Inject 40 Units into the skin 3 (three) times daily before meals., Disp: , Rfl:    HYDROcodone-acetaminophen (NORCO) 5-325 MG tablet, Take 1 tablet by mouth every 6 (six) hours as needed., Disp: 20 tablet, Rfl: 0   metoprolol tartrate (LOPRESSOR) 100 MG tablet, Take 100 mg by mouth 2 (two) times daily., Disp: , Rfl:    naloxone (NARCAN) nasal spray 4 mg/0.1 mL, Place 1 spray into the nose daily., Disp: , Rfl:    NOVOFINE 32G X 6 MM MISC, , Disp: , Rfl:    olmesartan-hydrochlorothiazide (BENICAR HCT) 40-25 MG per  tablet, Take 1 tablet by mouth every morning. , Disp: , Rfl:     omeprazole (PRILOSEC) 20 MG capsule, Take 20 mg by mouth every evening. , Disp: , Rfl:    polyethylene glycol (MIRALAX / GLYCOLAX) 17 g packet, Take 17 g by mouth daily as needed for moderate constipation., Disp: 14 each, Rfl: 0   potassium chloride SA (K-DUR,KLOR-CON) 20 MEQ tablet, Take 40 mEq by mouth every morning., Disp: , Rfl:    Review of Systems  Constitutional:  Negative for activity change, appetite change, chills, diaphoresis, fatigue, fever and unexpected weight change.  HENT:  Negative for congestion, rhinorrhea, sinus pressure, sneezing, sore throat and trouble swallowing.   Eyes:  Negative for photophobia and visual disturbance.  Respiratory:  Negative for cough, chest tightness, shortness of breath, wheezing and stridor.   Cardiovascular:  Negative for chest pain, palpitations and leg swelling.  Gastrointestinal:  Negative for abdominal distention, abdominal pain, anal bleeding, blood in stool, constipation, diarrhea, nausea and vomiting.  Genitourinary:  Negative for difficulty urinating, dysuria, flank pain and hematuria.  Musculoskeletal:  Positive for arthralgias and myalgias. Negative for back pain, gait problem and joint swelling.  Skin:  Positive for wound. Negative for color change, pallor and rash.  Neurological:  Negative for dizziness, tremors, weakness and light-headedness.  Hematological:  Negative for adenopathy. Does not bruise/bleed easily.  Psychiatric/Behavioral:  Negative for agitation, behavioral problems, confusion, decreased concentration, dysphoric mood and sleep disturbance.       Objective:   Physical Exam Constitutional:      Appearance: He is well-developed.  HENT:     Head: Normocephalic and atraumatic.  Eyes:     Conjunctiva/sclera: Conjunctivae normal.  Cardiovascular:     Rate and Rhythm: Normal rate and regular rhythm.  Pulmonary:     Effort: Pulmonary effort is normal. No respiratory distress.     Breath sounds: No wheezing.   Abdominal:     General: There is no distension.     Palpations: Abdomen is soft.  Musculoskeletal:     Cervical back: Normal range of motion and neck supple.     Left knee: Swelling and effusion present.  Skin:    General: Skin is warm and dry.     Coloration: Skin is not pale.     Findings: No erythema or rash.  Neurological:     General: No focal deficit present.     Mental Status: He is alert and oriented to person, place, and time.  Psychiatric:        Mood and Affect: Mood normal.        Behavior: Behavior normal.        Thought Content: Thought content normal.        Judgment: Judgment normal.     Right operative site 09/02/2021:         Right foot October 06, 2021:    Right 11/11/2021:      Left calf, left knee October 07, 2019:          Left foot October 06, 2021:     Left foot 11/11/2021:\         Assessment & Plan:   Polymicrobial osteomyelitis of the right foot:  He is status post amputation first toe and partial amputation second toe with Streptococcus intermedius group B streptococcus isolated cultures in OR along with beta-lactamase producing Prevotella species and Streptococcus intermedius having been found in blood cultures:  He has completed IV antibiotics with ceftriaxone and oral metronidazole through  October 20 and been on Augmentin 875 125 since then twice daily.  I will recheck sed rate CRP CBC with differential BMP.  I will actually have him stop his Augmentin to increase the yield and potential septic knee cultures  Streptococcus ntermedius bacteremia: Has received sufficient antibiotics did not have endocarditis on 2D echocardiogram  Left calf abscess: Status post I&D's were unrevealing on Augmentin  Left likely septic knee:   Have asked him to stop antibiotics to give [place] a chance of potentially isolate any organism on culture on Tuesday.  Would make sure that fluid sent for cell count differential in  addition to cultures since cultures may not yield an organism on antibiotics.  We would like to be involved in his care and I think the easiest option to coordinate care would be for him to come to the hospital so that we can have a PICC line placed and initiate IV antibiotics.  He will need 6 weeks of antibiotics for his septic knee.   Toe cyanosis: Cool today but not cyanotic anymore.  Hypertension: Continuing metoprolol blood pressure up in clinic with [place] on 1 check  Vitals:   11/11/21 0946  BP: (!) 150/90  Pulse: 63  Temp: 98.3 F (36.8 C)  SpO2: 100%   Diabetes mellitus he is been on insulin.  I spent 37 minutes with the patient including face to face counseling of the patient guarding his osteomyelitis his calf abscess his prior bacteremia his likely septic knee, personally reviewing most recent plain films of the foot, updated culture data from his surgery on December 14 along with, along review of medical records before and during the visit and in coordination of his care.

## 2021-11-11 NOTE — Progress Notes (Signed)
° °  Covid-19 Vaccination Clinic  Name:  [name] [name]    MRN: [id number] DOB: 02-Jan-1956  11/11/2021  Mr. Sarti was observed post Covid-19 immunization for 15 minutes without incident. He was provided with Vaccine Information Sheet and instruction to access the V-Safe system.   Mr. Heo was instructed to call 911 with any severe reactions post vaccine: Difficulty breathing  Swelling of face and throat  A fast heartbeat  A bad rash all over body  Dizziness and weakness   Immunizations Administered     Name Date Dose VIS Date Route   Pfizer Covid-19 Vaccine Bivalent Booster 11/11/2021 10:44 AM 0.3 mL 07/20/2021 Intramuscular   Manufacturer: [employer], [employer]   Lot: [id number]   NDC: [id number]      Philippa Chester, CMA

## 2021-11-11 NOTE — Progress Notes (Signed)
PCP - Dr. Azucena Cecil Cardiologist - denies EKG - 11/07/21 Chest x-ray -  ECHO - 09/24/20 Cardiac Cath - 2011 CPAP -   Aspirin Instructions: Follow your surgeon's instructions on when to stop Aspirin.  If no instructions were given by your surgeon then you will need to call the office to get those instructions.    ERAS Protcol - 12:45 ERAS COVID TEST- n/a  Anesthesia review: n/a  -------------  SDW INSTRUCTIONS:  Your procedure is scheduled on 12/27. Please report to [facility] Main Entrance "A" at 1315 A.M., and check in at the Admitting office. Call this number if you have problems the morning of surgery: [phone]   Remember: Do not eat after midnight the night before your surgery  You may drink clear liquids until 12:45 the morning of your surgery.   Clear liquids allowed are: Water, Non-Citrus Juices (without pulp), Carbonated Beverages, Clear Tea, Black Coffee Only, and Gatorade   Medications to take morning of surgery with a sip of water include: Xanax if needed Augmentin Wellbutrin Norco if needed  As of today, STOP taking any Aspirin (unless otherwise instructed by your surgeon), Aleve, Naproxen, Ibuprofen, Motrin, Advil, Goody's, BC's, all herbal medications, fish oil, and all vitamins.  ** PLEASE check your blood sugar the morning of your surgery when you wake up and every 2 hours until you get to the Short Stay unit.  If your blood sugar is less than 70 mg/dL, you will need to treat for low blood sugar: Do not take insulin. Treat a low blood sugar (less than 70 mg/dL) with  cup of clear juice (cranberry or apple), 4 glucose tablets, OR glucose gel. Recheck blood sugar in 15 minutes after treatment (to make sure it is greater than 70 mg/dL). If your blood sugar is not greater than 70 mg/dL on recheck, call [phone] for further instructions.   The Morning of Surgery Do not wear jewelry Do not wear lotions, powders, colognes, or deodorant Do not bring valuables  to the hospital. [facility] is not responsible for any belongings or valuables.  If you are a smoker, DO NOT Smoke 24 hours prior to surgery  If you wear a CPAP at night please bring your mask the morning of surgery   Remember that you must have someone to transport you home after your surgery, and remain with you for 24 hours if you are discharged the same day.  Please bring cases for contacts, glasses, hearing aids, dentures or bridgework because it cannot be worn into surgery.   Patients discharged the day of surgery will not be allowed to drive home.   Please shower the NIGHT BEFORE/MORNING OF SURGERY (use antibacterial soap like DIAL soap if possible). Wear comfortable clothes the morning of surgery. Oral Hygiene is also important to reduce your risk of infection.  Remember - BRUSH YOUR TEETH THE MORNING OF SURGERY WITH YOUR REGULAR TOOTHPASTE  Patient denies shortness of breath, fever, cough and chest pain.

## 2021-11-11 NOTE — Patient Instructions (Signed)
Stop the antibiotics so we have a chance of recovering an organism in the operating room culture.  Still think the easiest option for [place] to manage to as far as infection in the knee is for [place] to see you while you are in the hospital and coordinate placement of PICC line and IV antibiotics.  If does not this does not occur we will coordinate through clinic.

## 2021-11-15 ENCOUNTER — Inpatient Hospital Stay (HOSPITAL_COMMUNITY)
Admission: RE | Admit: 2021-11-15 | Discharge: 2021-11-17 | DRG: 487 | Disposition: A | Discharge status: Home / Self Care | Payer: 59 | Attending: Orthopaedic Surgery | Admitting: Orthopaedic Surgery

## 2021-11-15 ENCOUNTER — Encounter (HOSPITAL_COMMUNITY): Payer: Self-pay | Admitting: Orthopaedic Surgery

## 2021-11-15 ENCOUNTER — Ambulatory Visit (HOSPITAL_COMMUNITY): Payer: 59 | Admitting: Certified Registered Nurse Anesthetist

## 2021-11-15 ENCOUNTER — Encounter: Payer: 59 | Admitting: Podiatry

## 2021-11-15 ENCOUNTER — Encounter (HOSPITAL_COMMUNITY)
Admission: RE | Disposition: A | Discharge status: Home / Self Care | Payer: Self-pay | Source: Home / Self Care | Attending: Orthopaedic Surgery

## 2021-11-15 ENCOUNTER — Other Ambulatory Visit: Payer: Self-pay

## 2021-11-15 DIAGNOSIS — Z7982 Long term (current) use of aspirin: Secondary | ICD-10-CM | POA: Diagnosis not present

## 2021-11-15 DIAGNOSIS — Z89411 Acquired absence of right great toe: Secondary | ICD-10-CM

## 2021-11-15 DIAGNOSIS — I10 Essential (primary) hypertension: Secondary | ICD-10-CM | POA: Diagnosis present

## 2021-11-15 DIAGNOSIS — M25462 Effusion, left knee: Secondary | ICD-10-CM | POA: Diagnosis present

## 2021-11-15 DIAGNOSIS — G4733 Obstructive sleep apnea (adult) (pediatric): Secondary | ICD-10-CM | POA: Diagnosis present

## 2021-11-15 DIAGNOSIS — M659 Synovitis and tenosynovitis, unspecified: Secondary | ICD-10-CM | POA: Diagnosis present

## 2021-11-15 DIAGNOSIS — Z888 Allergy status to other drugs, medicaments and biological substances status: Secondary | ICD-10-CM | POA: Diagnosis not present

## 2021-11-15 DIAGNOSIS — Z794 Long term (current) use of insulin: Secondary | ICD-10-CM

## 2021-11-15 DIAGNOSIS — M23204 Derangement of unspecified medial meniscus due to old tear or injury, left knee: Secondary | ICD-10-CM | POA: Diagnosis present

## 2021-11-15 DIAGNOSIS — F419 Anxiety disorder, unspecified: Secondary | ICD-10-CM | POA: Diagnosis present

## 2021-11-15 DIAGNOSIS — F32A Depression, unspecified: Secondary | ICD-10-CM | POA: Diagnosis present

## 2021-11-15 DIAGNOSIS — Z951 Presence of aortocoronary bypass graft: Secondary | ICD-10-CM

## 2021-11-15 DIAGNOSIS — E782 Mixed hyperlipidemia: Secondary | ICD-10-CM | POA: Diagnosis present

## 2021-11-15 DIAGNOSIS — I2510 Atherosclerotic heart disease of native coronary artery without angina pectoris: Secondary | ICD-10-CM | POA: Diagnosis present

## 2021-11-15 DIAGNOSIS — Z8249 Family history of ischemic heart disease and other diseases of the circulatory system: Secondary | ICD-10-CM | POA: Diagnosis not present

## 2021-11-15 DIAGNOSIS — Z79899 Other long term (current) drug therapy: Secondary | ICD-10-CM | POA: Diagnosis not present

## 2021-11-15 DIAGNOSIS — M1712 Unilateral primary osteoarthritis, left knee: Secondary | ICD-10-CM | POA: Diagnosis present

## 2021-11-15 DIAGNOSIS — M009 Pyogenic arthritis, unspecified: Principal | ICD-10-CM | POA: Diagnosis present

## 2021-11-15 HISTORY — PX: KNEE ARTHROSCOPY: SHX127

## 2021-11-15 LAB — SYNOVIAL CELL COUNT + DIFF, W/ CRYSTALS
Crystals, Fluid: NONE SEEN
Eosinophils-Synovial: 0 % (ref 0–1)
Lymphocytes-Synovial Fld: 3 % (ref 0–20)
Monocyte-Macrophage-Synovial Fluid: 6 % — ABNORMAL LOW (ref 50–90)
Neutrophil, Synovial: 91 % — ABNORMAL HIGH (ref 0–25)
WBC, Synovial: 75600 /mm3 — ABNORMAL HIGH (ref 0–200)

## 2021-11-15 LAB — POCT I-STAT, CHEM 8
BUN: 14 mg/dL (ref 8–23)
Calcium, Ion: 1.21 mmol/L (ref 1.15–1.40)
Chloride: 101 mmol/L (ref 98–111)
Creatinine, Ser: 1.1 mg/dL (ref 0.61–1.24)
Glucose, Bld: 49 mg/dL — ABNORMAL LOW (ref 70–99)
HCT: 32 % — ABNORMAL LOW (ref 39.0–52.0)
Hemoglobin: 10.9 g/dL — ABNORMAL LOW (ref 13.0–17.0)
Potassium: 3.2 mmol/L — ABNORMAL LOW (ref 3.5–5.1)
Sodium: 138 mmol/L (ref 135–145)
TCO2: 24 mmol/L (ref 22–32)

## 2021-11-15 LAB — GLUCOSE, CAPILLARY
Glucose-Capillary: 73 mg/dL (ref 70–99)
Glucose-Capillary: 142 mg/dL — ABNORMAL HIGH (ref 70–99)
Glucose-Capillary: 72 mg/dL (ref 70–99)
Glucose-Capillary: 153 mg/dL — ABNORMAL HIGH (ref 70–99)
Glucose-Capillary: 159 mg/dL — ABNORMAL HIGH (ref 70–99)

## 2021-11-15 SURGERY — ARTHROSCOPY, KNEE
Anesthesia: General | Site: Knee | Laterality: Left

## 2021-11-15 MED ORDER — TRANEXAMIC ACID-NACL 1000-0.7 MG/100ML-% IV SOLN
1000.0000 mg | Freq: Once | INTRAVENOUS | Status: AC
  Administered 2021-11-15: 16:00:00 1000 mg via INTRAVENOUS
  Filled 2021-11-15: qty 100

## 2021-11-15 MED ORDER — LIDOCAINE 2% (20 MG/ML) 5 ML SYRINGE
INTRAMUSCULAR | Status: DC | PRN
  Administered 2021-11-15: 100 mg via INTRAVENOUS

## 2021-11-15 MED ORDER — PROPOFOL 10 MG/ML IV BOLUS
INTRAVENOUS | Status: DC | PRN
  Administered 2021-11-15: 200 mg via INTRAVENOUS

## 2021-11-15 MED ORDER — PHENYLEPHRINE 40 MCG/ML (10ML) SYRINGE FOR IV PUSH (FOR BLOOD PRESSURE SUPPORT)
PREFILLED_SYRINGE | INTRAVENOUS | Status: DC | PRN
  Administered 2021-11-15: 40 ug via INTRAVENOUS
  Administered 2021-11-15 (×2): 80 ug via INTRAVENOUS
  Administered 2021-11-15: 40 ug via INTRAVENOUS

## 2021-11-15 MED ORDER — CEFAZOLIN SODIUM-DEXTROSE 2-4 GM/100ML-% IV SOLN
2.0000 g | Freq: Four times a day (QID) | INTRAVENOUS | Status: AC
  Administered 2021-11-15 – 2021-11-16 (×3): 2 g via INTRAVENOUS
  Filled 2021-11-15 (×3): qty 100

## 2021-11-15 MED ORDER — METOCLOPRAMIDE HCL 5 MG PO TABS: 5.0000 mg | ORAL_TABLET | Freq: Three times a day (TID) | ORAL | Status: DC | PRN

## 2021-11-15 MED ORDER — EPHEDRINE SULFATE-NACL 50-0.9 MG/10ML-% IV SOSY
PREFILLED_SYRINGE | INTRAVENOUS | Status: DC | PRN
  Administered 2021-11-15: 5 mg via INTRAVENOUS
  Administered 2021-11-15: 10 mg via INTRAVENOUS
  Administered 2021-11-15 (×2): 5 mg via INTRAVENOUS

## 2021-11-15 MED ORDER — MIDAZOLAM HCL 5 MG/5ML IJ SOLN
INTRAMUSCULAR | Status: DC | PRN
  Administered 2021-11-15: 2 mg via INTRAVENOUS

## 2021-11-15 MED ORDER — CEFAZOLIN SODIUM 1 G IJ SOLR
INTRAMUSCULAR | Status: AC
  Filled 2021-11-15: qty 20

## 2021-11-15 MED ORDER — CHLORHEXIDINE GLUCONATE 0.12 % MT SOLN
15.0000 mL | Freq: Once | OROMUCOSAL | Status: AC
  Administered 2021-11-15: 13:00:00 15 mL via OROMUCOSAL
  Filled 2021-11-15: qty 15

## 2021-11-15 MED ORDER — DEXAMETHASONE SODIUM PHOSPHATE 10 MG/ML IJ SOLN
INTRAMUSCULAR | Status: DC | PRN
  Administered 2021-11-15: 5 mg via INTRAVENOUS

## 2021-11-15 MED ORDER — SODIUM CHLORIDE 0.9 % IV SOLN: INTRAVENOUS | Status: DC

## 2021-11-15 MED ORDER — BUPIVACAINE HCL (PF) 0.25 % IJ SOLN
INTRAMUSCULAR | Status: AC
  Filled 2021-11-15: qty 30

## 2021-11-15 MED ORDER — SORBITOL 70 % SOLN: 30.0000 mL | Freq: Every day | Status: DC | PRN

## 2021-11-15 MED ORDER — OXYCODONE HCL 5 MG/5ML PO SOLN: 5.0000 mg | Freq: Once | ORAL | Status: DC | PRN

## 2021-11-15 MED ORDER — EPHEDRINE 5 MG/ML INJ
INTRAVENOUS | Status: AC
  Filled 2021-11-15: qty 5

## 2021-11-15 MED ORDER — ONDANSETRON HCL 4 MG/2ML IJ SOLN: 4.0000 mg | Freq: Four times a day (QID) | INTRAMUSCULAR | Status: DC | PRN

## 2021-11-15 MED ORDER — OXYCODONE HCL 5 MG PO TABS
5.0000 mg | ORAL_TABLET | ORAL | Status: DC | PRN
  Administered 2021-11-15 – 2021-11-16 (×5): 10 mg via ORAL
  Administered 2021-11-16: 01:00:00 5 mg via ORAL
  Administered 2021-11-16 – 2021-11-17 (×3): 10 mg via ORAL
  Filled 2021-11-15 (×7): qty 2

## 2021-11-15 MED ORDER — OXYCODONE HCL 5 MG PO TABS: 5.0000 mg | ORAL_TABLET | Freq: Once | ORAL | Status: DC | PRN

## 2021-11-15 MED ORDER — DEXTROSE 50 % IV SOLN
INTRAVENOUS | Status: AC
  Administered 2021-11-15: 12:00:00 25 g via INTRAVENOUS
  Filled 2021-11-15: qty 50

## 2021-11-15 MED ORDER — BUPROPION HCL ER (XL) 300 MG PO TB24
300.0000 mg | ORAL_TABLET | Freq: Every morning | ORAL | Status: DC
  Administered 2021-11-16 – 2021-11-17 (×2): 300 mg via ORAL
  Filled 2021-11-15 (×2): qty 1

## 2021-11-15 MED ORDER — SODIUM CHLORIDE 0.9 % IR SOLN
Status: DC | PRN
  Administered 2021-11-15: 3000 mL

## 2021-11-15 MED ORDER — SODIUM CHLORIDE 0.9 % IR SOLN
Status: DC | PRN
  Administered 2021-11-15: 1000 mL

## 2021-11-15 MED ORDER — FENTANYL CITRATE (PF) 100 MCG/2ML IJ SOLN
INTRAMUSCULAR | Status: AC
  Filled 2021-11-15: qty 2

## 2021-11-15 MED ORDER — ACETAMINOPHEN 325 MG PO TABS
325.0000 mg | ORAL_TABLET | Freq: Four times a day (QID) | ORAL | Status: DC | PRN
  Administered 2021-11-16 – 2021-11-17 (×2): 650 mg via ORAL
  Filled 2021-11-15 (×2): qty 2

## 2021-11-15 MED ORDER — DEXAMETHASONE SODIUM PHOSPHATE 10 MG/ML IJ SOLN
INTRAMUSCULAR | Status: AC
  Filled 2021-11-15: qty 1

## 2021-11-15 MED ORDER — FENTANYL CITRATE (PF) 250 MCG/5ML IJ SOLN
INTRAMUSCULAR | Status: DC | PRN
  Administered 2021-11-15: 50 ug via INTRAVENOUS

## 2021-11-15 MED ORDER — OXYCODONE-ACETAMINOPHEN 5-325 MG PO TABS: 1.0000 | ORAL_TABLET | Freq: Four times a day (QID) | ORAL | 0 refills | Status: DC | PRN

## 2021-11-15 MED ORDER — ONDANSETRON HCL 4 MG PO TABS: 4.0000 mg | ORAL_TABLET | Freq: Four times a day (QID) | ORAL | Status: DC | PRN

## 2021-11-15 MED ORDER — LACTATED RINGERS IV SOLN: INTRAVENOUS | Status: DC

## 2021-11-15 MED ORDER — DEXTROSE 50 % IV SOLN: 25.0000 g | INTRAVENOUS | Status: AC

## 2021-11-15 MED ORDER — BUPIVACAINE HCL (PF) 0.25 % IJ SOLN: INTRAMUSCULAR | Status: DC | PRN

## 2021-11-15 MED ORDER — METOCLOPRAMIDE HCL 5 MG/ML IJ SOLN: 5.0000 mg | Freq: Three times a day (TID) | INTRAMUSCULAR | Status: DC | PRN

## 2021-11-15 MED ORDER — MIDAZOLAM HCL 2 MG/2ML IJ SOLN
INTRAMUSCULAR | Status: AC
  Filled 2021-11-15: qty 2

## 2021-11-15 MED ORDER — OXYCODONE HCL 5 MG PO TABS
10.0000 mg | ORAL_TABLET | ORAL | Status: DC | PRN
  Filled 2021-11-15 (×2): qty 2

## 2021-11-15 MED ORDER — AMLODIPINE BESYLATE 5 MG PO TABS
10.0000 mg | ORAL_TABLET | Freq: Every day | ORAL | Status: DC
  Administered 2021-11-15 – 2021-11-16 (×2): 10 mg via ORAL
  Filled 2021-11-15 (×2): qty 2

## 2021-11-15 MED ORDER — SODIUM CHLORIDE (PF) 0.9 % IJ SOLN
INTRAMUSCULAR | Status: AC
  Filled 2021-11-15: qty 10

## 2021-11-15 MED ORDER — METOPROLOL TARTRATE 25 MG PO TABS
100.0000 mg | ORAL_TABLET | Freq: Two times a day (BID) | ORAL | Status: DC
  Administered 2021-11-15 – 2021-11-17 (×4): 100 mg via ORAL
  Filled 2021-11-15 (×4): qty 4

## 2021-11-15 MED ORDER — DOCUSATE SODIUM 100 MG PO CAPS
100.0000 mg | ORAL_CAPSULE | Freq: Two times a day (BID) | ORAL | Status: DC
  Administered 2021-11-15 – 2021-11-17 (×4): 100 mg via ORAL
  Filled 2021-11-15 (×4): qty 1

## 2021-11-15 MED ORDER — INSULIN LISPRO PROT & LISPRO (50-50 MIX) 100 UNIT/ML KWIKPEN: 40.0000 [IU] | PEN_INJECTOR | Freq: Three times a day (TID) | SUBCUTANEOUS | Status: DC

## 2021-11-15 MED ORDER — LIDOCAINE 2% (20 MG/ML) 5 ML SYRINGE
INTRAMUSCULAR | Status: AC
  Filled 2021-11-15: qty 5

## 2021-11-15 MED ORDER — METHOCARBAMOL 1000 MG/10ML IJ SOLN
500.0000 mg | Freq: Four times a day (QID) | INTRAVENOUS | Status: DC | PRN
  Filled 2021-11-15: qty 5

## 2021-11-15 MED ORDER — DIPHENHYDRAMINE HCL 12.5 MG/5ML PO ELIX
25.0000 mg | ORAL_SOLUTION | ORAL | Status: DC | PRN
  Filled 2021-11-15: qty 10

## 2021-11-15 MED ORDER — ALPRAZOLAM 0.5 MG PO TABS: 0.5000 mg | ORAL_TABLET | Freq: Two times a day (BID) | ORAL | Status: DC | PRN

## 2021-11-15 MED ORDER — CEFAZOLIN SODIUM 1 G IJ SOLR
INTRAMUSCULAR | Status: AC
  Filled 2021-11-15: qty 10

## 2021-11-15 MED ORDER — CEFAZOLIN SODIUM-DEXTROSE 2-3 GM-%(50ML) IV SOLR
INTRAVENOUS | Status: DC | PRN
  Administered 2021-11-15: 2 g via INTRAVENOUS

## 2021-11-15 MED ORDER — HYDROMORPHONE HCL 1 MG/ML IJ SOLN: 0.5000 mg | INTRAMUSCULAR | Status: DC | PRN

## 2021-11-15 MED ORDER — POLYETHYLENE GLYCOL 3350 17 G PO PACK
17.0000 g | PACK | Freq: Every day | ORAL | Status: DC | PRN
  Administered 2021-11-17: 08:00:00 17 g via ORAL
  Filled 2021-11-15: qty 1

## 2021-11-15 MED ORDER — FENTANYL CITRATE (PF) 250 MCG/5ML IJ SOLN
INTRAMUSCULAR | Status: AC
  Filled 2021-11-15: qty 5

## 2021-11-15 MED ORDER — FENTANYL CITRATE (PF) 100 MCG/2ML IJ SOLN
25.0000 ug | INTRAMUSCULAR | Status: DC | PRN
  Administered 2021-11-15: 14:00:00 50 ug via INTRAVENOUS

## 2021-11-15 MED ORDER — INSULIN ASPART PROT & ASPART (70-30 MIX) 100 UNIT/ML ~~LOC~~ SUSP
40.0000 [IU] | Freq: Three times a day (TID) | SUBCUTANEOUS | Status: DC
  Administered 2021-11-15 – 2021-11-17 (×5): 15 [IU] via SUBCUTANEOUS
  Filled 2021-11-15: qty 10

## 2021-11-15 MED ORDER — PROPOFOL 10 MG/ML IV BOLUS
INTRAVENOUS | Status: AC
  Filled 2021-11-15: qty 20

## 2021-11-15 MED ORDER — PHENYLEPHRINE 40 MCG/ML (10ML) SYRINGE FOR IV PUSH (FOR BLOOD PRESSURE SUPPORT)
PREFILLED_SYRINGE | INTRAVENOUS | Status: AC
  Filled 2021-11-15: qty 10

## 2021-11-15 MED ORDER — PHENYLEPHRINE HCL-NACL 20-0.9 MG/250ML-% IV SOLN
INTRAVENOUS | Status: DC | PRN
  Administered 2021-11-15: 25 ug/min via INTRAVENOUS

## 2021-11-15 MED ORDER — ONDANSETRON HCL 4 MG PO TABS: 4.0000 mg | ORAL_TABLET | Freq: Three times a day (TID) | ORAL | 0 refills | Status: DC | PRN

## 2021-11-15 MED ORDER — ACETAMINOPHEN 500 MG PO TABS
1000.0000 mg | ORAL_TABLET | Freq: Four times a day (QID) | ORAL | Status: AC
  Administered 2021-11-15 – 2021-11-16 (×4): 1000 mg via ORAL
  Filled 2021-11-15 (×4): qty 2

## 2021-11-15 MED ORDER — METHOCARBAMOL 500 MG PO TABS
500.0000 mg | ORAL_TABLET | Freq: Four times a day (QID) | ORAL | Status: DC | PRN
  Administered 2021-11-15 – 2021-11-17 (×7): 500 mg via ORAL
  Filled 2021-11-15 (×7): qty 1

## 2021-11-15 MED ORDER — ORAL CARE MOUTH RINSE: 15.0000 mL | Freq: Once | OROMUCOSAL | Status: AC

## 2021-11-15 MED ORDER — ASPIRIN 325 MG PO TABS
325.0000 mg | ORAL_TABLET | Freq: Every day | ORAL | Status: DC
  Administered 2021-11-15 – 2021-11-17 (×3): 325 mg via ORAL
  Filled 2021-11-15 (×3): qty 1

## 2021-11-15 MED ORDER — ONDANSETRON HCL 4 MG/2ML IJ SOLN
INTRAMUSCULAR | Status: AC
  Filled 2021-11-15: qty 2

## 2021-11-15 SURGICAL SUPPLY — 46 items
BAG COUNTER SPONGE SURGICOUNT (BAG) ×2 IMPLANT
BAG SPNG CNTER NS LX DISP (BAG) ×1
BAG SURGICOUNT SPONGE COUNTING (BAG) ×1
BANDAGE ESMARK 6X9 LF (GAUZE/BANDAGES/DRESSINGS) ×1 IMPLANT
BLADE CLIPPER SURG (BLADE) IMPLANT
BLADE EXCALIBUR 4.0MM X 13CM (MISCELLANEOUS) ×1
BLADE EXCALIBUR 4.0X13 (MISCELLANEOUS) ×1 IMPLANT
BLADE SURG 11 STRL SS (BLADE) IMPLANT
BNDG CMPR 9X6 STRL LF SNTH (GAUZE/BANDAGES/DRESSINGS) ×1
BNDG ELASTIC 6X5.8 VLCR STR LF (GAUZE/BANDAGES/DRESSINGS) ×5 IMPLANT
BNDG ESMARK 6X9 LF (GAUZE/BANDAGES/DRESSINGS) ×3
COVER SURGICAL LIGHT HANDLE (MISCELLANEOUS) ×3 IMPLANT
CUFF TOURN SGL QUICK 34 (TOURNIQUET CUFF)
CUFF TOURN SGL QUICK 42 (TOURNIQUET CUFF) IMPLANT
CUFF TRNQT CYL 34X4.125X (TOURNIQUET CUFF) IMPLANT
DRAPE ARTHROSCOPY W/POUCH 114 (DRAPES) ×3 IMPLANT
DRAPE SURG 17X23 STRL (DRAPES) ×6 IMPLANT
DRAPE U-SHAPE 47X51 STRL (DRAPES) ×3 IMPLANT
DRSG PAD ABDOMINAL 8X10 ST (GAUZE/BANDAGES/DRESSINGS) ×2 IMPLANT
DURAPREP 26ML APPLICATOR (WOUND CARE) ×3 IMPLANT
ELECT CAUTERY BLADE 6.4 (BLADE) IMPLANT
FACESHIELD WRAPAROUND (MASK) ×3 IMPLANT
FACESHIELD WRAPAROUND OR TEAM (MASK) ×1 IMPLANT
GAUZE SPONGE 4X4 12PLY STRL (GAUZE/BANDAGES/DRESSINGS) ×3 IMPLANT
GAUZE XEROFORM 1X8 LF (GAUZE/BANDAGES/DRESSINGS) ×3 IMPLANT
GLOVE SURG LTX SZ7 (GLOVE) ×3 IMPLANT
GLOVE SURG UNDER POLY LF SZ7 (GLOVE) ×60 IMPLANT
GLOVE SURG UNDER POLY LF SZ7.5 (GLOVE) ×12 IMPLANT
GOWN STRL REIN XL XLG (GOWN DISPOSABLE) ×3 IMPLANT
KIT TURNOVER KIT B (KITS) ×3 IMPLANT
MANIFOLD NEPTUNE II (INSTRUMENTS) IMPLANT
NS IRRIG 1000ML POUR BTL (IV SOLUTION) IMPLANT
PACK ARTHROSCOPY DSU (CUSTOM PROCEDURE TRAY) ×3 IMPLANT
PAD ARMBOARD 7.5X6 YLW CONV (MISCELLANEOUS) ×6 IMPLANT
PADDING CAST COTTON 6X4 STRL (CAST SUPPLIES) ×3 IMPLANT
PORT APPOLLO RF 90DEGREE MULTI (SURGICAL WAND) ×2 IMPLANT
SPONGE T-LAP 4X18 ~~LOC~~+RFID (SPONGE) ×3 IMPLANT
SUT ETHILON 3 0 PS 1 (SUTURE) ×5 IMPLANT
SWAB COLLECTION DEVICE MRSA (MISCELLANEOUS) ×2 IMPLANT
SWAB CULTURE ESWAB REG 1ML (MISCELLANEOUS) ×2 IMPLANT
TOWEL GREEN STERILE (TOWEL DISPOSABLE) ×3 IMPLANT
TOWEL GREEN STERILE FF (TOWEL DISPOSABLE) ×3 IMPLANT
TUBE CONNECTING 12'X1/4 (SUCTIONS) ×1
TUBE CONNECTING 12X1/4 (SUCTIONS) ×1 IMPLANT
TUBING ARTHROSCOPY IRRIG 16FT (MISCELLANEOUS) ×3 IMPLANT
WATER STERILE IRR 1000ML POUR (IV SOLUTION) ×3 IMPLANT

## 2021-11-15 NOTE — Anesthesia Preprocedure Evaluation (Addendum)
Anesthesia Evaluation  Patient identified by MRN, date of birth, ID band Patient awake    Reviewed: Allergy & Precautions, H&P , NPO status , Patient's Chart, lab work & pertinent test results  Airway Mallampati: II  TM Distance: >3 FB Neck ROM: full    Dental  (+) Dental Advisory Given, Teeth Intact   Pulmonary sleep apnea ,    breath sounds clear to auscultation       Cardiovascular hypertension, + CAD and + CABG   Rhythm:regular Rate:Normal     Neuro/Psych PSYCHIATRIC DISORDERS Anxiety Depression  Neuromuscular disease    GI/Hepatic GERD  ,  Endo/Other  diabetes, Type 2  Renal/GU      Musculoskeletal   Abdominal (+) + obese,   Peds  Hematology  (+) anemia ,   Anesthesia Other Findings   Reproductive/Obstetrics                            Anesthesia Physical Anesthesia Plan  ASA: 3  Anesthesia Plan: General   Post-op Pain Management: Minimal or no pain anticipated   Induction: Intravenous  PONV Risk Score and Plan: 2 and Ondansetron, Dexamethasone, Midazolam and Treatment may vary due to age or medical condition  Airway Management Planned: LMA  Additional Equipment: None  Intra-op Plan:   Post-operative Plan: Extubation in OR  Informed Consent: I have reviewed the patients History and Physical, chart, labs and discussed the procedure including the risks, benefits and alternatives for the proposed anesthesia with the patient or authorized representative who has indicated his/her understanding and acceptance.     Dental advisory given  Plan Discussed with: CRNA  Anesthesia Plan Comments:        Anesthesia Quick Evaluation

## 2021-11-15 NOTE — Transfer of Care (Signed)
Immediate Anesthesia Transfer of Care Note  Patient: [name] [name]  Procedure(s) Performed: LEFT KNEE ARTHROSCOPIC IRRIGATION AND DEBRIDEMENT (Left: Knee)  Patient Location: PACU  Anesthesia Type:General  Level of Consciousness: drowsy  Airway & Oxygen Therapy: Patient Spontanous Breathing and Patient connected to nasal cannula oxygen  Post-op Assessment: Report given to RN and Post -op Vital signs reviewed and stable  Post vital signs: Reviewed and stable  Last Vitals:  Vitals Value Taken Time  BP 107/66 11/15/21 1359  Temp    Pulse 62 11/15/21 1401  Resp 17 11/15/21 1401  SpO2 100 % 11/15/21 1401  Vitals shown include unvalidated device data.  Last Pain:  Vitals:   11/15/21 1231  TempSrc:   PainSc: 0-No pain         Complications: No notable events documented.

## 2021-11-15 NOTE — Op Note (Signed)
Surgery Date: 11/15/2021  PREOPERATIVE DIAGNOSES:  1.  Probable septic arthritis left knee 2.  Degenerative joint disease left knee  POSTOPERATIVE DIAGNOSES:  Septic arthritis left knee Degenerative joint disease left knee  PROCEDURES PERFORMED:  1.  Left knee arthroscopy with major synovectomy 2.  Left knee arthroscopy with arthroscopic partial medial meniscectomy 3.  Left knee arthroscopy with arthroscopic chondroplasty medial femoral condyle, patellar surface  SURGEON: N. Glee Arvin, M.D.  ASSIST: Starlyn Skeans [place], [place]; necessary for the timely completion of procedure and due to complexity of procedure.  ANESTHESIA:  general  FLUIDS: Per anesthesia record.   ESTIMATED BLOOD LOSS: minimal  DESCRIPTION OF PROCEDURE: [name] is a 65 y.o.-year-old male with above mentioned conditions. Full discussion held regarding risks benefits alternatives and complications related surgical intervention. Conservative care options reviewed. All questions answered.  The patient was identified in the preoperative holding area and the operative extremity was marked. The patient was brought to the operating room and transferred to operating table in a supine position. Satisfactory general anesthesia was induced by anesthesiology.    We first and aspirated the knee joint from a superior lateral approach.  We obtained approximately 20 cc of murky bloody joint effusion which was sent for cell count and cultures.  Standard anterolateral, anteromedial arthroscopy portals were obtained. The anteromedial portal was obtained with a spinal needle for localization under direct visualization with subsequent diagnostic findings.   Once in the knee joint there was extensive synovitis throughout the medial and lateral compartments and gutters as well as the suprapatellar pouch.  Major synovectomy was performed in all areas with an oscillating shaver.  Synovial tissue from the femoral notch and lateral  gutter was obtained for culture.  After thorough synovectomy we found a complex degenerative tear of the medial meniscus.  Partial medial meniscectomy was performed back to a stable border.  There was widespread degenerative joint disease.  Grade 4 changes in the medial compartment.  The cruciates were severely degenerated.  The lateral femoral condyle and lateral tibial plateau both had bleeding bony surfaces in the weightbearing portions.  There was missing portions of the cartilage and the underlying subchondral bone.  This appearance was consistent with osteochondral defects.  The bone bled fairly easily.  The lateral meniscus was also severely degenerative and macerated.  The knee was placed in extension and again there was severe degenerative changes of the chondral surfaces.  Chondroplasty was performed for areas of unstable cartilage.  Gutters were checked for loose bodies.  Excess fluid was expressed from the knee joint.  Incisions were closed with interrupted nylon sutures.  Sterile dressings were applied.  Patient tolerated procedure well had no immediate complications.  Suprapatellar pouch and gutters: severe synovitis or debris. Patella chondral surface: Grade 4 Trochlear chondral surface: Grade 4 Patellofemoral tracking: Normal Medial meniscus: Complex degenerative tear.  Medial femoral condyle weight bearing surface: Grade 4 Medial tibial plateau: Grade 4 Anterior cruciate ligament: Severely degenerated Posterior cruciate ligament: Degenerated Lateral meniscus: Degenerative tears.   Lateral femoral condyle weight bearing surface: Grade 4 Lateral tibial plateau: Grade 4  DISPOSITION: The patient was awakened from general anesthetic, extubated, taken to the recovery room in medically stable condition, no apparent complications. The patient may be weightbearing as tolerated to the operative lower extremity.  Range of motion of right knee as tolerated.  He will be admitted to the hospital  for treatment of septic arthritis of the left knee.  ID team is on board.  N.  Eduard Roux, MD [facility] 9:29 PM

## 2021-11-15 NOTE — Anesthesia Procedure Notes (Signed)
Procedure Name: LMA Insertion Date/Time: 11/15/2021 12:54 PM Performed by: Waynard Edwards, CRNA Pre-anesthesia Checklist: Patient identified, Emergency Drugs available, Suction available and Patient being monitored Patient Re-evaluated:Patient Re-evaluated prior to induction Oxygen Delivery Method: Circle system utilized Preoxygenation: Pre-oxygenation with 100% oxygen Induction Type: IV induction Ventilation: Mask ventilation without difficulty LMA: LMA inserted LMA Size: 5.0 Number of attempts: 1 Placement Confirmation: positive ETCO2 Tube secured with: Tape Dental Injury: Teeth and Oropharynx as per pre-operative assessment

## 2021-11-15 NOTE — Progress Notes (Signed)
PT Cancellation Note  Patient Details Name: [name] [name] MRN: [id number] DOB: August 14, 1956   Cancelled Treatment:    Reason Eval/Treat Not Completed: Other (comment). Pt up from OR and eating. Nursing assisted with w/c to bed transfer. Pt has been w/c level at home since last September. Will evaluate in AM.    Angelina Ok [facility] 11/15/2021, 4:21 PM Skip Mayer PT Acute Rehabilitation Services Pager [phone] Office [phone]

## 2021-11-15 NOTE — Progress Notes (Signed)
Hypoglycemic Event  CBG: 49  Treatment: D50 50 mL (25 gm)  Symptoms: None  Follow-up CBG: Time: 1226 CBG Result:142  Possible Reasons for Event: Inadequate meal intake  Comments/MD notified:    Devan Danzer R

## 2021-11-15 NOTE — H&P (Signed)
PREOPERATIVE H&P  Chief Complaint: left knee effusion, possible septic arthritis  HPI: [name] [name] is a 65 y.o. male who presents for surgical treatment of left knee effusion, possible septic arthritis.  He denies any changes in medical history.  Past Medical History:  Diagnosis Date   Anxiety    Baker's cyst 10/06/2021   Bulbous urethral stricture    chronic--- hx multiple dilatation's and urolume stent's   CAD (coronary artery disease) 04/ 2011  (previous cardiologist-  dr h. Katrinka Blazing , lov 06/ 2016)  currently followed by pcp   03-03-2010  CABG x 5, LIMA to LAD, left radial artery to OM1,SVG to R1, seqSVG to PDA and PLA   Cyanosis 10/06/2021   Depression    ED (erectile dysfunction)    Essential hypertension, benign    GERD (gastroesophageal reflux disease)    History of pancreatitis 2005   Knee effusion 10/06/2021   Mixed hyperlipidemia    OSA on CPAP    not used recently   S/P CABG x 5 03/03/2010   Septic arthritis of knee, left (HCC) 11/11/2021   Type II or unspecified type diabetes mellitus without mention of complication, not stated as uncontrolled    endocrinologist-  dr Sharl Ma   Past Surgical History:  Procedure Laterality Date   AMPUTATION Right 08/09/2021   Procedure: AMPUTATION DIGIT;  Surgeon: Vivi Barrack, DPM;  Location: [facility];  Service: Podiatry;  Laterality: Right;   CARDIAC CATHETERIZATION  03-01-2010   dr Verdis Prime   nuclear stress test --positive moderate ishemia:  severe multivessel CAD w/ total occlusion of pLAD, HG obstrucion of pPDA with supplies collaterals around the apex to the LAD, severe stenosis distal RCA, total occlusion OM1, 75% D1, normal LVF    CORONARY ARTERY BYPASS GRAFT  03-03-2010  dr hendrickson    LIMA to LAD, SVG to RI, left radial artery to OM1, seqSVG  to PDA and PLA   CYST EXCISION Left 11/02/2021   Procedure: REMOVAL OF LEFT CALF MASS;  Surgeon: Tarry Kos, MD;  Location: [facility];  Service:  Orthopedics;  Laterality: Left;   CYSTO W/ DILATATION , LASER INCISION OF URETHRAL STRICTURE  02-25-2005   dr Vernie Ammons  [facility]   CYSTO/  DILATION URETHRAL MEATAL STENOSIS AND DILATION BULBOUS STRICTURE  12-21-2004   dr Vonita Moss  [facility]   CYSTO/ RETROGRADE PYELOGRAM/ DILATATION URETHRAL STRICTURE AND PLACEMENT UROLUME STENT  08-04-2011    dr Vernie Ammons  [facility]   CYSTOSCOPY WITH DIRECT VISION INTERNAL URETHROTOMY  07/15/2008   dr Vernie Ammons  [facility]   and UroLume Stent placement   CYSTOSCOPY WITH URETHRAL DILATATION N/A 04/19/2018   Procedure: CYSTOSCOPY WITH BALLOON URETHRAL DILATATION;  Surgeon: Ihor Gully, MD;  Location: [facility];  Service: Urology;  Laterality: N/A;   INCISION AND DRAINAGE Right 08/13/2021   Procedure: INCISION AND DRAINAGE RIGHT FOOT WOUND;  Surgeon: Vivi Barrack, DPM;  Location: [facility];  Service: Podiatry;  Laterality: Right;   ORIF TOE FRACTURE Left 05/22/2013   Procedure: OPEN REDUCTION INTERNAL FIXATION (ORIF) LEFT SECOND TOE PROXIMAL PHALANX FRACTURE/LEFT PERCUTANEOUS PINNING;  Surgeon: Toni Arthurs, MD;  Location: [facility];  Service: Orthopedics;  Laterality: Left;   TONSILLECTOMY  child   Social History   Socioeconomic History   Marital status: Married    Spouse name: Not on file   Number of children: Not on file   Years of education: Not on file   Highest education level: Not on  file  Occupational History   Not on file  Tobacco Use   Smoking status: Never   Smokeless tobacco: Never  Vaping Use   Vaping Use: Never used  Substance and Sexual Activity   Alcohol use: Yes    Comment: occasional   Drug use: No   Sexual activity: Not Currently  Other Topics Concern   Not on file  Social History Narrative   Not on file   Social Determinants of Health   Financial Resource Strain: Not on file  Food Insecurity: Not on file  Transportation Needs: Not on file  Physical Activity: Not on file  Stress: Not on file  Social Connections: Not on file    Family History  Problem Relation Age of Onset   Cancer Mother        lung   Hypertension Father    CAD Father    CVA Brother    Allergies  Allergen Reactions   Hydrochlorothiazide     Other reaction(s): hyponatremia   Metformin Hcl     Other reaction(s): upset stomach   Prior to Admission medications   Medication Sig Start Date End Date Taking? Authorizing Provider  ALPRAZolam Prudy Feeler) 0.5 MG tablet Take 0.5 mg by mouth 2 (two) times daily as needed for anxiety.   Yes [provider]  amLODipine (NORVASC) 10 MG tablet Take 10 mg by mouth at bedtime. 06/14/20  Yes [provider]  amoxicillin-clavulanate (AUGMENTIN) 875-125 MG tablet Take 1 tablet by mouth 2 (two) times daily. 11/09/21  Yes Cristie Hem, PA-C  aspirin 325 MG tablet Take 325 mg by mouth daily.   Yes [provider]  atorvastatin (LIPITOR) 80 MG tablet Take 80 mg by mouth every evening.   Yes [provider]  buPROPion (WELLBUTRIN XL) 300 MG 24 hr tablet Take 300 mg by mouth every morning.    Yes [provider]  fenofibrate 160 MG tablet Take 160 mg by mouth every evening. 07/01/14  Yes [provider]  fish oil-omega-3 fatty acids 1000 MG capsule Take 1 g by mouth daily.    Yes [provider]  HUMALOG MIX 50/50 KWIKPEN (50-50) 100 UNIT/ML Kwikpen Inject 40 Units into the skin 3 (three) times daily before meals. 07/19/20  Yes [provider]  metoprolol tartrate (LOPRESSOR) 100 MG tablet Take 100 mg by mouth 2 (two) times daily.   Yes [provider]  naloxone (NARCAN) nasal spray 4 mg/0.1 mL Place 1 spray into the nose daily. 11/02/21  Yes [provider]  olmesartan-hydrochlorothiazide (BENICAR HCT) 40-25 MG per tablet Take 1 tablet by mouth every morning.    Yes [provider]  omeprazole (PRILOSEC) 20 MG capsule Take 20 mg by mouth every evening.    Yes [provider]  polyethylene glycol (MIRALAX /  GLYCOLAX) 17 g packet Take 17 g by mouth daily as needed for moderate constipation. 08/15/21  Yes Kathlen Mody, MD  potassium chloride SA (K-DUR,KLOR-CON) 20 MEQ tablet Take 40 mEq by mouth every morning.   Yes [provider]  collagenase (SANTYL) ointment Apply 1 application topically daily. 09/29/21   Vivi Barrack, DPM  HYDROcodone-acetaminophen (NORCO) 5-325 MG tablet Take 1 tablet by mouth every 6 (six) hours as needed. 11/02/21   Tarry Kos, MD  NOVOFINE 32G X 6 MM MISC  07/01/14   [provider]     Positive ROS: All other systems have been reviewed and were otherwise negative with the exception of those  mentioned in the HPI and as above.  Physical Exam: General: Alert, no acute distress Cardiovascular: No pedal edema Respiratory: No cyanosis, no use of accessory musculature GI: abdomen soft Skin: No lesions in the area of chief complaint Neurologic: Sensation intact distally Psychiatric: Patient is competent for consent with normal mood and affect Lymphatic: no lymphedema  MUSCULOSKELETAL: exam stable  Assessment: left knee effusion, possible septic arthritis  Plan: Plan for Procedure(s): LEFT KNEE ARTHROSCOPIC IRRIGATION AND DEBRIDEMENT  The risks benefits and alternatives were discussed with the patient including but not limited to the risks of nonoperative treatment, versus surgical intervention including infection, bleeding, nerve injury,  blood clots, cardiopulmonary complications, morbidity, mortality, among others, and they were willing to proceed.   Preoperative templating of the joint replacement has been completed, documented, and submitted to the Operating Room personnel in order to optimize intra-operative equipment management.   Glee Arvin, MD 11/15/2021 11:08 AM

## 2021-11-15 NOTE — Discharge Instructions (Addendum)
Continue with IV rocephin via picc line per infectious disease.  Please follow up with them acoordingly  Weight bearing as tolerated operative extremity Do not remove bandage Follow up with Dr. Roda Shutters in one week for recheck

## 2021-11-16 ENCOUNTER — Encounter (HOSPITAL_COMMUNITY): Payer: Self-pay | Admitting: Orthopaedic Surgery

## 2021-11-16 ENCOUNTER — Inpatient Hospital Stay: Payer: Self-pay

## 2021-11-16 DIAGNOSIS — M009 Pyogenic arthritis, unspecified: Secondary | ICD-10-CM | POA: Diagnosis not present

## 2021-11-16 LAB — CBC
HCT: 28.7 % — ABNORMAL LOW (ref 39.0–52.0)
Hemoglobin: 8.8 g/dL — ABNORMAL LOW (ref 13.0–17.0)
MCH: 23.2 pg — ABNORMAL LOW (ref 26.0–34.0)
MCHC: 30.7 g/dL (ref 30.0–36.0)
MCV: 75.5 fL — ABNORMAL LOW (ref 80.0–100.0)
Platelets: 428 10*3/uL — ABNORMAL HIGH (ref 150–400)
RBC: 3.8 MIL/uL — ABNORMAL LOW (ref 4.22–5.81)
RDW: 16.4 % — ABNORMAL HIGH (ref 11.5–15.5)
WBC: 13.2 10*3/uL — ABNORMAL HIGH (ref 4.0–10.5)
nRBC: 0 % (ref 0.0–0.2)

## 2021-11-16 LAB — BASIC METABOLIC PANEL WITH GFR
Anion gap: 13 (ref 5–15)
BUN: 14 mg/dL (ref 8–23)
CO2: 21 mmol/L — ABNORMAL LOW (ref 22–32)
Calcium: 9.3 mg/dL (ref 8.9–10.3)
Chloride: 98 mmol/L (ref 98–111)
Creatinine, Ser: 1.25 mg/dL — ABNORMAL HIGH (ref 0.61–1.24)
GFR, Estimated: >60 mL/min
Glucose, Bld: 162 mg/dL — ABNORMAL HIGH (ref 70–99)
Potassium: 3.8 mmol/L (ref 3.5–5.1)
Sodium: 132 mmol/L — ABNORMAL LOW (ref 135–145)

## 2021-11-16 LAB — GLUCOSE, CAPILLARY
Glucose-Capillary: 158 mg/dL — ABNORMAL HIGH (ref 70–99)
Glucose-Capillary: 126 mg/dL — ABNORMAL HIGH (ref 70–99)
Glucose-Capillary: 135 mg/dL — ABNORMAL HIGH (ref 70–99)
Glucose-Capillary: 84 mg/dL (ref 70–99)

## 2021-11-16 LAB — SEDIMENTATION RATE: Sed Rate: 80 mm/hr — ABNORMAL HIGH (ref 0–16)

## 2021-11-16 MED ORDER — CHLORHEXIDINE GLUCONATE CLOTH 2 % EX PADS
6.0000 | MEDICATED_PAD | Freq: Every day | CUTANEOUS | Status: DC
  Administered 2021-11-16 – 2021-11-17 (×2): 6 via TOPICAL

## 2021-11-16 MED ORDER — SODIUM CHLORIDE 0.9% FLUSH: 10.0000 mL | INTRAVENOUS | Status: DC | PRN

## 2021-11-16 MED ORDER — SODIUM CHLORIDE 0.9 % IV SOLN
2.0000 g | INTRAVENOUS | Status: DC
  Administered 2021-11-16 – 2021-11-17 (×2): 2 g via INTRAVENOUS
  Filled 2021-11-16 (×2): qty 20

## 2021-11-16 NOTE — Consult Note (Signed)
[name] for Infectious Disease    Date of Admission:  11/15/2021     Total days of antibiotics 0  Ceftriaxone to start           Reason for Consult: native knee septic arthritis     Referring Provider: Erlinda Hong Primary Care Provider: Antony Contras, MD    Assessment: [name] [name] is a 65 y.o. male now POD1 following Left knee I&D for septic arthritis. Long history of foot ulcerations to both feet - underwent Rt great toe amputation 3 months ago with concurrent bacteremia at that time from streptococcus intermedius. This site has healed over nicely and no concern for ongoing infection. Cultures of that site revealed polymicrobial infection with GBS, Streptotoccus Intermedius and Prevotella. He states that shortly after this surgery and hospitalization (maybe during it from his description) he noticed new quality of pain to the left knee which progressed to eventually over the last month or so to requiring wheelchair assistance and inability to weight bear.  He was treated with prolonged course of IV antibiotics for the DFU/foot osteomyelitis and has been maintained on Augmentin continually up until 12/23.   Over this time he underwent surgery for left calf abscess - No culture growth with AFB pending (would think atypical infection would be less likely). MRI on 11/08 noted severe synovitis of the left knee and has progressively worsened with swelling and pain requiring formal I&D. Initial gram stains obtained yesterday from aspirate and synovium are negative with cultures pending currently.   Would start ceftriaxone and follow cultures. He was bacteremic in September with Streptococcus Intermedius (S-CTX, PCN) and would largely presume this was seeded at that time given his description of clinical worsening since then. He had no evidence of osteomyelitis on MRI or in OR findings. Given native knee and good surgical control we can plan for 4 weeks of IV antibiotics; given chronicity  may plan protracted oral therapy after in follow up. Will place PICC line now and order for care management.  CRP and ESR today for therapeutic monitoring. OPAT as outlined below. Will get him a FU appt with Dr. Tommy Medal in 3-4 weeks.   We can follow up culture results remotely and plan for possible D/C tomorrow after PICC and home health can be arranged if OK with Ortho from surgery standpoint.    Plan: Care management consult PICC line placement (single)  Start ceftriaxone 2 gm IV daily    OPAT ORDERS:  Diagnosis: Left native knee septic arthritis   Culture Result: pending (hx strep intermedius bacteremia)   Allergies  Allergen Reactions   Hydrochlorothiazide     Other reaction(s): hyponatremia   Metformin Hcl     Other reaction(s): upset stomach     Discharge antibiotics to be given via PICC line:  Per pharmacy protocol CEFTRIAXONE 2 g IV daily    Duration: 4 weeks    End Date: Dec 14 2021   [facility] Per Protocol with [employer] Use: Home health RN for IV administration and teaching, line care and labs.    Labs weekly while on IV antibiotics: _x_ CBC with differential _x_ CMP _x_ CRP every 2 weeks  _x_ ESR every 2 weeks   _x_ Please pull PIC at completion of IV antibiotics __ Please leave PIC in place until doctor has seen patient or been notified  Fax weekly labs to [phone]  Clinic Follow Up Appt: 1/25 @ 2:45 pm with Dr. Lucianne Lei  Dam        Principal Problem:   Knee effusion, left Active Problems:   Septic arthritis of knee, left (HCC)    acetaminophen  1,000 mg Oral Q6H   amLODipine  10 mg Oral QHS   aspirin  325 mg Oral Daily   buPROPion  300 mg Oral q morning   docusate sodium  100 mg Oral BID   insulin aspart protamine- aspart  40 Units Subcutaneous TID with meals   metoprolol tartrate  100 mg Oral BID    HPI: [name] [name] is a 65 y.o. male admitted to the hospital for I&D of the left knee for presumed pyogenic septic arthritis. He  is followed by Dr. Jacqualyn Posey (podiatry) Dr. Erlinda Hong (ortho) and Dr. Tommy Medal.   He has a complicated history of infections to both feet. Underwent Rt first toe and partial amputation of second toe (Cx GBS, Streptotoccus intermedius, prevotella --> bacteremic with streptococcus intermedius at this time) on 08/09/2021. He had ABIs at this time that revealed normal arterial flow bilaterally.   Required I&D of the right foot 08/13/2021. He was treated with 6 weeks of ceftriaxone + metronidazole then transitioned to Augmentin 875/125 mg BID on 09/08/21 to be continued through November. This has continued to heal nicely for him and continues to do so.   Early/Mid November he developed severe edema to the left calf --> dopplers r/o DVT but revealed a large bakers cyst. Aspiration was obtained showing 6,263 WBC 89%N with negative GS/culture while on continued Augmentin. MRI showed small effusion of Lt knee with extensive synovitis and erosive changes felt to be r/t septic joint amongst medial and lateral meniscal tears.  He underwent planned excision of cyst on 11/02/21 however noted that the compartment of the cyst was "full of frank pus" (GS/Cx negative). He has maintained a JP drain to this site up until yesterday's Left Knee I&D on 12/27.   Taken to OR on 12/27 for I&D of the Left knee. Seen in ID office with Dr. Tommy Medal on 12/23 --> stopped abx prior to surgery in hopes to yield an organism. Op note reviewed and aspirate of the knee "murky bloody fluid" with extensive synovitis throughout the joint that required debridement. This was also sent for culture. Cell count of the aspirated fluid 75,600 WBC (91%N, 3%L).    He tells me that he has had years of left knee pain from arthritis and cartilage tears. However he was always able to ambulate without walking aids until late September. He has progressively been unable to bear weight and required a cane and more recently a wheelchair for assistance. No fevers or chills.  Has not lapsed any of the PO antibiotics. Has tolerated them well without any concerning side effects.   He was pleased to work with PT this morning and found that he was able to bear weight on the leg today with the help of walker.    Review of Systems: Review of Systems  Constitutional:  Negative for chills, diaphoresis, fever and malaise/fatigue.  Respiratory:  Negative for cough.   Cardiovascular:  Positive for leg swelling. Negative for chest pain.  Gastrointestinal:  Negative for abdominal pain, nausea and vomiting.  Genitourinary: Negative.  Negative for dysuria.  Musculoskeletal:  Positive for joint pain.  Skin:  Negative for itching and rash.  Neurological: Negative.      Past Medical History:  Diagnosis Date   Anxiety    Baker's cyst 10/06/2021   Bulbous urethral stricture  chronic--- hx multiple dilatation's and urolume stent's   CAD (coronary artery disease) 04/ 2011  (previous cardiologist-  dr h. Tamala Julian , lov 06/ 2016)  currently followed by pcp   03-03-2010  CABG x 5, LIMA to LAD, left radial artery to OM1,SVG to R1, seqSVG to PDA and PLA   Cyanosis 10/06/2021   Depression    ED (erectile dysfunction)    Essential hypertension, benign    GERD (gastroesophageal reflux disease)    History of pancreatitis 2005   Knee effusion 10/06/2021   Mixed hyperlipidemia    OSA on CPAP    not used recently   S/P CABG x 5 03/03/2010   Septic arthritis of knee, left ([facility]) 11/11/2021   Type II or unspecified type diabetes mellitus without mention of complication, not stated as uncontrolled    endocrinologist-  dr Buddy Duty    Social History   Tobacco Use   Smoking status: Never   Smokeless tobacco: Never  Vaping Use   Vaping Use: Never used  Substance Use Topics   Alcohol use: Yes    Comment: occasional   Drug use: No    Family History  Problem Relation Age of Onset   Cancer Mother        lung   Hypertension Father    CAD Father    CVA Brother    Allergies   Allergen Reactions   Hydrochlorothiazide     Other reaction(s): hyponatremia   Metformin Hcl     Other reaction(s): upset stomach    OBJECTIVE: Blood pressure 136/66, pulse 74, temperature 97.8 F (36.6 C), temperature source Oral, resp. rate 18, height 6' (1.829 m), weight 113.4 kg, SpO2 100 %.  Physical Exam Vitals and nursing note reviewed.  Constitutional:      Comments: Seated comfortably in recliner.   Eyes:     Extraocular Movements: Extraocular movements intact.     Pupils: Pupils are equal, round, and reactive to light.  Cardiovascular:     Rate and Rhythm: Normal rate and regular rhythm.  Abdominal:     General: Bowel sounds are normal.     Palpations: Abdomen is soft.  Musculoskeletal:        General: Swelling present. Deformity: left knee with swelling noted underlying clean ACE wrap from OR. Skin:    General: Skin is warm and dry.     Findings: No rash.  Neurological:     Mental Status: He is alert and oriented to person, place, and time.    Lab Results Lab Results  Component Value Date   WBC 13.2 (H) 11/16/2021   HGB 8.8 (L) 11/16/2021   HCT 28.7 (L) 11/16/2021   MCV 75.5 (L) 11/16/2021   PLT 428 (H) 11/16/2021    Lab Results  Component Value Date   CREATININE 1.25 (H) 11/16/2021   BUN 14 11/16/2021   NA 132 (L) 11/16/2021   K 3.8 11/16/2021   CL 98 11/16/2021   CO2 21 (L) 11/16/2021    Lab Results  Component Value Date   ALT 27 08/20/2021   AST 24 08/20/2021   ALKPHOS 164 (H) 08/20/2021   BILITOT 0.7 08/20/2021     Microbiology: Recent Results (from the past 240 hour(s))  Aerobic/Anaerobic Culture w Gram Stain (surgical/deep wound)     Status: None (Preliminary result)   Collection Time: 11/15/21  1:30 PM   Specimen: Synovial, Left Knee; Body Fluid  Result Value Ref Range Status   Specimen Description SYNOVIAL FLUID  Final  Special Requests LEFT KNEE FLUID SPEC A  Final   Gram Stain   Final    FEW WBC PRESENT,BOTH PMN AND  MONONUCLEAR NO ORGANISMS SEEN Performed at [facility], [place] [address], [place], [place] [address]    Culture PENDING  Incomplete   Report Status PENDING  Incomplete  Aerobic/Anaerobic Culture w Gram Stain (surgical/deep wound)     Status: None (Preliminary result)   Collection Time: 11/15/21  1:37 PM   Specimen: PATH Cytology Misc. fluid; Body Fluid  Result Value Ref Range Status   Specimen Description TISSUE  Final   Special Requests LEFT KNEE SYNOVIAL TISSUE SPEC B  Final   Gram Stain   Final    RARE WBC PRESENT, PREDOMINANTLY MONONUCLEAR NO ORGANISMS SEEN Performed at [facility], [address], [place], [place] [address]    Culture PENDING  Incomplete   Report Status PENDING  Incomplete    Janene Madeira, MSN, NP-C [facility] Pager: [phone]  11/16/2021 8:56 AM

## 2021-11-16 NOTE — Evaluation (Signed)
Physical Therapy Evaluation Patient Details Name: [name] [name] MRN: [id number] DOB: 1955-12-24 Today's Date: 11/16/2021  History of Present Illness  Pt adm 12/27 with left knee effusion and possible septic arthritis and underwent lt knee arthroscopic I&D. Pt with recent (11/02/2021) excisional debridement of lt calf abscess.  PMH - rt great toe amputation 07/2021, CAD, HTN, CABG, DM,  Clinical Impression  PTA, pt lives with his spouse and is modI with mobility using a wheelchair (since roughly September). Pt reports improvement in pain and strength post op. Session focused on HEP for left lower extremity strengthening and ROM in addition to gait training. Pt ambulating 30 feet with a walker, utilizing a step to pattern. Cues for progressive weightbearing through LLE. He will need follow up PT for further strengthening, ROM, gait and balance training.     Recommendations for follow up therapy are one component of a multi-disciplinary discharge planning process, led by the attending physician.  Recommendations may be updated based on patient status, additional functional criteria and insurance authorization.  Follow Up Recommendations Home health PT    Assistance Recommended at Discharge PRN  Functional Status Assessment Patient has had a recent decline in their functional status and demonstrates the ability to make significant improvements in function in a reasonable and predictable amount of time.  Equipment Recommendations  Rolling walker (2 wheels)    Recommendations for Other Services       Precautions / Restrictions Precautions Precautions: Fall Restrictions Weight Bearing Restrictions: Yes LLE Weight Bearing: Weight bearing as tolerated      Mobility  Bed Mobility Overal bed mobility: Needs Assistance Bed Mobility: Supine to Sit     Supine to sit: Supervision          Transfers Overall transfer level: Needs assistance Equipment used: Rolling walker (2  wheels) Transfers: Sit to/from Stand Sit to Stand: Min guard                Ambulation/Gait Ambulation/Gait assistance: Min guard Gait Distance (Feet): 30 Feet Assistive device: Rolling walker (2 wheels) Gait Pattern/deviations: Step-to pattern;Decreased stance time - left;Decreased dorsiflexion - left;Knee flexed in stance - left Gait velocity: decreased Gait velocity interpretation: <1.8 ft/sec, indicate of risk for recurrent falls   General Gait Details: Heavy reliance through arms on walker, step to pattern, cues for progressive weightbearing through LLE and walker proximity.  Stairs            Wheelchair Mobility    Modified Rankin (Stroke Patients Only)       Balance Overall balance assessment: Needs assistance Sitting-balance support: Feet supported Sitting balance-Leahy Scale: Good     Standing balance support: Bilateral upper extremity supported Standing balance-Leahy Scale: Poor                               Pertinent Vitals/Pain Pain Assessment: Faces Faces Pain Scale: Hurts a little bit Pain Location: left knee Pain Descriptors / Indicators: Operative site guarding Pain Intervention(s): Monitored during session    Home Living Family/patient expects to be discharged to:: Private residence Living Arrangements: Spouse/significant other Available Help at Discharge: Family Type of Home: House Home Access: Ramped entrance       Home Layout: One level Home Equipment: Tub bench;Wheelchair - manual      Prior Function Prior Level of Function : Independent/Modified Independent             Mobility Comments: modI with wheelchair mobility  Hand Dominance        Extremity/Trunk Assessment   Upper Extremity Assessment Upper Extremity Assessment: Defer to OT evaluation    Lower Extremity Assessment Lower Extremity Assessment: RLE deficits/detail;LLE deficits/detail RLE Deficits / Details: Muscle atrophy noted, >  3/5 strength LLE Deficits / Details: s/p I&D, grossly 3/5, knee flexion to 90 degrees       Communication   Communication: No difficulties  Cognition Arousal/Alertness: Awake/alert Behavior During Therapy: WFL for tasks assessed/performed Overall Cognitive Status: Within Functional Limits for tasks assessed                                          General Comments      Exercises General Exercises - Lower Extremity Quad Sets: Left;15 reps;Supine Long Arc Quad: Left;10 reps;Seated Heel Slides: Left;10 reps;Supine Hip ABduction/ADduction: Left;10 reps;Supine Straight Leg Raises: Left;10 reps;Supine   Assessment/Plan    PT Assessment Patient needs continued PT services  PT Problem List Decreased strength;Decreased range of motion;Decreased activity tolerance;Decreased balance;Decreased mobility;Pain       PT Treatment Interventions DME instruction;Gait training;Functional mobility training;Therapeutic activities;Therapeutic exercise;Balance training;Patient/family education    PT Goals (Current goals can be found in the Care Plan section)  Acute Rehab PT Goals Patient Stated Goal: to walk PT Goal Formulation: With patient Time For Goal Achievement: 11/30/21 Potential to Achieve Goals: Good    Frequency Min 5X/week   Barriers to discharge        Co-evaluation               AM-PAC PT "6 Clicks" Mobility  Outcome Measure Help needed turning from your back to your side while in a flat bed without using bedrails?: None Help needed moving from lying on your back to sitting on the side of a flat bed without using bedrails?: A Little Help needed moving to and from a bed to a chair (including a wheelchair)?: A Little Help needed standing up from a chair using your arms (e.g., wheelchair or bedside chair)?: A Little Help needed to walk in hospital room?: A Little Help needed climbing 3-5 steps with a railing? : A Lot 6 Click Score: 18    End of  Session Equipment Utilized During Treatment: Gait belt Activity Tolerance: Patient tolerated treatment well Patient left: in chair;with call bell/phone within reach Nurse Communication: Mobility status PT Visit Diagnosis: Other abnormalities of gait and mobility (R26.89);Difficulty in walking, not elsewhere classified (R26.2);Pain Pain - Right/Left: Left Pain - part of body: Knee    Time: 3295-1884 PT Time Calculation (min) (ACUTE ONLY): 30 min   Charges:   PT Evaluation $PT Eval Low Complexity: 1 Low PT Treatments $Therapeutic Exercise: 8-22 mins        Lillia Pauls, PT, DPT Acute Rehabilitation Services Pager [phone] Office [phone]   Norval Morton 11/16/2021, 10:28 AM

## 2021-11-16 NOTE — Progress Notes (Signed)
Subjective: 1 Day Post-Op Procedure(s) (LRB): LEFT KNEE ARTHROSCOPIC IRRIGATION AND DEBRIDEMENT (Left) Patient reports pain as mild.    Objective: Vital signs in last 24 hours: Temp:  [97.1 F (36.2 C)-97.8 F (36.6 C)] 97.8 F (36.6 C) (12/28 0358) Pulse Rate:  [64-74] 65 (12/28 0358) Resp:  [16-18] 18 (12/28 0358) BP: (107-136)/(64-74) 120/66 (12/28 0358) SpO2:  [100 %] 100 % (12/28 0358) Weight:  [113.4 kg] 113.4 kg (12/27 1102)  Intake/Output from previous day: 12/27 0701 - 12/28 0700 In: 1220 [P.O.:420; I.V.:500; IV Piggyback:300] Out: 825 [Urine:825] Intake/Output this shift: No intake/output data recorded.  Recent Labs    11/15/21 1158 11/16/21 0747  HGB 10.9* 8.8*   Recent Labs    11/15/21 1158 11/16/21 0747  WBC  --  13.2*  RBC  --  3.80*  HCT 32.0* 28.7*  PLT  --  428*   Recent Labs    11/15/21 1158  NA 138  K 3.2*  CL 101  BUN 14  CREATININE 1.10  GLUCOSE 49*   No results for input(s): LABPT, INR in the last 72 hours.  Neurologically intact Neurovascular intact Sensation intact distally Intact pulses distally Dorsiflexion/Plantar flexion intact Incision: dressing C/D/I No cellulitis present Compartment soft   Assessment/Plan: 1 Day Post-Op Procedure(s) (LRB): LEFT KNEE ARTHROSCOPIC IRRIGATION AND DEBRIDEMENT (Left) Advance diet Up with therapy D/C IV fluids LLE- wbat.  Rom as tolerated ID consulted for management of septic left knee.  Will likely need picc line for iv abx Cultures pending      Cristie Hem 11/16/2021, 8:16 AM

## 2021-11-16 NOTE — Progress Notes (Signed)
Peripherally Inserted Central Catheter Placement  The IV Nurse has discussed with the patient and/or persons authorized to consent for the patient, the purpose of this procedure and the potential benefits and risks involved with this procedure.  The benefits include less needle sticks, lab draws from the catheter, and the patient may be discharged home with the catheter. Risks include, but not limited to, infection, bleeding, blood clot (thrombus formation), and puncture of an artery; nerve damage and irregular heartbeat and possibility to perform a PICC exchange if needed/ordered by physician.  Alternatives to this procedure were also discussed.  Bard Power PICC patient education guide, fact sheet on infection prevention and patient information card has been provided to patient /or left at bedside.    PICC Placement Documentation  PICC Single Lumen 11/16/21 Right Basilic 43 cm 0 cm (Active)  Indication for Insertion or Continuance of Line Home intravenous therapies (PICC only) 11/16/21 1840  Exposed Catheter (cm) 0 cm 11/16/21 1840  Site Assessment Clean;Dry;Intact 11/16/21 1840  Line Status Flushed;Saline locked;Blood return noted 11/16/21 1840  Dressing Type Transparent 11/16/21 1840  Dressing Status Clean;Dry;Intact 11/16/21 1840  Antimicrobial disc in place? Yes 11/16/21 1840  Dressing Intervention New dressing;Other (Comment) 11/16/21 1840  Dressing Change Due 11/23/21 11/16/21 1840       Maximino Greenland 11/16/2021, 6:52 PM

## 2021-11-16 NOTE — Anesthesia Postprocedure Evaluation (Signed)
Anesthesia Post Note  Patient: [name] [name]  Procedure(s) Performed: LEFT KNEE ARTHROSCOPIC IRRIGATION AND DEBRIDEMENT (Left: Knee)     Patient location during evaluation: PACU Anesthesia Type: General Level of consciousness: sedated and patient cooperative Pain management: pain level controlled Vital Signs Assessment: post-procedure vital signs reviewed and stable Respiratory status: spontaneous breathing Cardiovascular status: stable Anesthetic complications: no   No notable events documented.  Last Vitals:  Vitals:   11/16/21 0358 11/16/21 0823  BP: 120/66 136/66  Pulse: 65 74  Resp: 18 18  Temp: 36.6 C 36.6 C  SpO2: 100% 100%    Last Pain:  Vitals:   11/16/21 0823  TempSrc: Oral  PainSc:                  [name]

## 2021-11-17 ENCOUNTER — Other Ambulatory Visit: Payer: Self-pay | Admitting: Physician Assistant

## 2021-11-17 LAB — GLUCOSE, CAPILLARY
Glucose-Capillary: 80 mg/dL (ref 70–99)
Glucose-Capillary: 93 mg/dL (ref 70–99)

## 2021-11-17 LAB — C-REACTIVE PROTEIN: CRP: 3.4 mg/dL — ABNORMAL HIGH (ref <1.0)

## 2021-11-17 MED ORDER — HEPARIN SOD (PORK) LOCK FLUSH 100 UNIT/ML IV SOLN
250.0000 [IU] | INTRAVENOUS | Status: AC | PRN
  Administered 2021-11-17: 15:00:00 250 [IU]
  Filled 2021-11-17: qty 2.5

## 2021-11-17 NOTE — Progress Notes (Signed)
Subjective: 2 Days Post-Op Procedure(s) (LRB): LEFT KNEE ARTHROSCOPIC IRRIGATION AND DEBRIDEMENT (Left) Patient reports pain as mild.    Objective: Vital signs in last 24 hours: Temp:  [97.6 F (36.4 C)-98.1 F (36.7 C)] 97.6 F (36.4 C) (12/29 0749) Pulse Rate:  [62-77] 75 (12/29 0749) Resp:  [16-20] 16 (12/29 0749) BP: (117-136)/(67-93) 133/73 (12/29 0749) SpO2:  [99 %-100 %] 100 % (12/29 0749)  Intake/Output from previous day: 12/28 0701 - 12/29 0700 In: 520 [P.O.:420; IV Piggyback:100] Out: 1550 [Urine:1550] Intake/Output this shift: No intake/output data recorded.  Recent Labs    11/15/21 1158 11/16/21 0747  HGB 10.9* 8.8*   Recent Labs    11/15/21 1158 11/16/21 0747  WBC  --  13.2*  RBC  --  3.80*  HCT 32.0* 28.7*  PLT  --  428*   Recent Labs    11/15/21 1158 11/16/21 0747  NA 138 132*  K 3.2* 3.8  CL 101 98  CO2  --  21*  BUN 14 14  CREATININE 1.10 1.25*  GLUCOSE 49* 162*  CALCIUM  --  9.3   No results for input(s): LABPT, INR in the last 72 hours.  Neurologically intact Neurovascular intact Sensation intact distally Intact pulses distally Dorsiflexion/Plantar flexion intact Incision: dressing C/D/I No cellulitis present Compartment soft   Assessment/Plan: 2 Days Post-Op Procedure(s) (LRB): LEFT KNEE ARTHROSCOPIC IRRIGATION AND DEBRIDEMENT (Left) Advance diet Up with therapy Discharge home with home healthnursing WBAT LLE Awaiting OPAT order from ID Intra-op cx negative to date, however left knee was likely seeded with strep intermedius from previous infection from right foot.   Picc line placed yesterday and patient currently on iv rocephin F/u with Dr. Roda Shutters in one week     Cristie Hem 11/17/2021, 9:12 AM

## 2021-11-17 NOTE — Progress Notes (Signed)
Physical Therapy Treatment Patient Details Name: [name] [name] MRN: [id number] DOB: 1956/06/14 Today's Date: 11/17/2021   History of Present Illness Pt adm 12/27 with left knee effusion and possible septic arthritis and underwent lt knee arthroscopic I&D. Pt with recent (11/02/2021) excisional debridement of lt calf abscess.  PMH - rt great toe amputation 07/2021, CAD, HTN, CABG, DM,    PT Comments    Pt requires min guard assist transfers and ambulation 25' with RW. He demonstrates mod I bed mobility. Session focused on LLE HEP. Pt performed all exercises supine in bed. Plan is for d/c home this afternoon.     Recommendations for follow up therapy are one component of a multi-disciplinary discharge planning process, led by the attending physician.  Recommendations may be updated based on patient status, additional functional criteria and insurance authorization.  Follow Up Recommendations  Home health PT     Assistance Recommended at Discharge PRN  Equipment Recommendations  Rolling walker (2 wheels)    Recommendations for Other Services       Precautions / Restrictions Precautions Precautions: Fall Restrictions LLE Weight Bearing: Weight bearing as tolerated     Mobility  Bed Mobility Overal bed mobility: Modified Independent                  Transfers Overall transfer level: Needs assistance Equipment used: Rolling walker (2 wheels) Transfers: Sit to/from Stand Sit to Stand: Min guard           General transfer comment: assist for safety, mild instability with initial stance    Ambulation/Gait Ambulation/Gait assistance: Min guard Gait Distance (Feet): 25 Feet Assistive device: Rolling walker (2 wheels) Gait Pattern/deviations: Step-to pattern;Decreased stance time - left;Decreased dorsiflexion - left;Knee flexed in stance - left;Antalgic Gait velocity: decreased Gait velocity interpretation: <1.8 ft/sec, indicate of risk for recurrent falls    General Gait Details: heavy reliance on RW   Stairs             Wheelchair Mobility    Modified Rankin (Stroke Patients Only)       Balance Overall balance assessment: Needs assistance Sitting-balance support: Feet supported;No upper extremity supported Sitting balance-Leahy Scale: Good     Standing balance support: Bilateral upper extremity supported;During functional activity;Reliant on assistive device for balance Standing balance-Leahy Scale: Poor                              Cognition Arousal/Alertness: Awake/alert Behavior During Therapy: WFL for tasks assessed/performed Overall Cognitive Status: Within Functional Limits for tasks assessed                                          Exercises General Exercises - Lower Extremity Ankle Circles/Pumps: AROM;Both;20 reps;Supine Quad Sets: Left;15 reps;AROM;Supine Short Arc Quad: AAROM;Left;5 reps Heel Slides: AROM;Left;10 reps;Supine Hip ABduction/ADduction: AROM;Left;10 reps;Supine Straight Leg Raises: AAROM;Left;5 reps;Supine    General Comments        Pertinent Vitals/Pain Pain Assessment: Faces Faces Pain Scale: Hurts a little bit Pain Location: left knee Pain Descriptors / Indicators: Discomfort;Grimacing Pain Intervention(s): Monitored during session;Repositioned    Home Living                          Prior Function            PT Goals (  current goals can now be found in the care plan section) Acute Rehab PT Goals Patient Stated Goal: home Progress towards PT goals: Progressing toward goals    Frequency    Min 5X/week      PT Plan Current plan remains appropriate    Co-evaluation              AM-PAC PT "6 Clicks" Mobility   Outcome Measure  Help needed turning from your back to your side while in a flat bed without using bedrails?: None Help needed moving from lying on your back to sitting on the side of a flat bed without using  bedrails?: None Help needed moving to and from a bed to a chair (including a wheelchair)?: A Little Help needed standing up from a chair using your arms (e.g., wheelchair or bedside chair)?: A Little Help needed to walk in hospital room?: A Little Help needed climbing 3-5 steps with a railing? : A Lot 6 Click Score: 19    End of Session   Activity Tolerance: Patient tolerated treatment well Patient left: in bed;with call bell/phone within reach Nurse Communication: Mobility status PT Visit Diagnosis: Other abnormalities of gait and mobility (R26.89);Difficulty in walking, not elsewhere classified (R26.2);Pain Pain - Right/Left: Left Pain - part of body: Knee     Time: 8756-4332 PT Time Calculation (min) (ACUTE ONLY): 12 min  Charges:  $Therapeutic Exercise: 8-22 mins                     Aida Raider, PT  Office # [phone] Pager [phone]    Ilda Foil 11/17/2021, 9:36 AM

## 2021-11-17 NOTE — Discharge Summary (Signed)
Patient ID: [name] [name] MRN: [id number] DOB/AGE: 12-27-1955 65 y.o.  Admit date: 11/15/2021 Discharge date: 11/17/2021  Admission Diagnoses:  Principal Problem:   Knee effusion, left Active Problems:   Septic arthritis of knee, left [facility])   Discharge Diagnoses:  Same  Past Medical History:  Diagnosis Date   Anxiety    Baker's cyst 10/06/2021   Bulbous urethral stricture    chronic--- hx multiple dilatation's and urolume stent's   CAD (coronary artery disease) 04/ 2011  (previous cardiologist-  dr h. Tamala Julian , lov 06/ 2016)  currently followed by pcp   03-03-2010  CABG x 5, LIMA to LAD, left radial artery to OM1,SVG to R1, seqSVG to PDA and PLA   Cyanosis 10/06/2021   Depression    ED (erectile dysfunction)    Essential hypertension, benign    GERD (gastroesophageal reflux disease)    History of pancreatitis 2005   Knee effusion 10/06/2021   Mixed hyperlipidemia    OSA on CPAP    not used recently   S/P CABG x 5 03/03/2010   Septic arthritis of knee, left ([place]) 11/11/2021   Type II or unspecified type diabetes mellitus without mention of complication, not stated as uncontrolled    endocrinologist-  dr Buddy Duty    Surgeries: Procedure(s): LEFT KNEE ARTHROSCOPIC IRRIGATION AND [place] on 11/15/2021   Consultants:   Discharged Condition: Improved  Hospital Course: [name] [name] is an 65 y.o. male who was admitted 11/15/2021 for operative treatment ofKnee effusion, left. Patient has severe unremitting pain that affects sleep, daily activities, and work/hobbies. After pre-op clearance the patient was taken to the operating room on 11/15/2021 and underwent  Procedure(s): LEFT KNEE ARTHROSCOPIC IRRIGATION AND DEBRIDEMENT.    Patient was given perioperative antibiotics:  Anti-infectives (From admission, onward)    Start     Dose/Rate Route Frequency Ordered Stop   11/16/21 1230  cefTRIAXone (ROCEPHIN) 2 g in sodium chloride 0.9 % 100 mL IVPB        2 g 200 mL/hr over  30 Minutes Intravenous Every 24 hours 11/16/21 1132     11/15/21 1600  ceFAZolin (ANCEF) IVPB 2g/100 mL premix        2 g 200 mL/hr over 30 Minutes Intravenous Every 6 hours 11/15/21 1500 11/16/21 0411        Patient was given sequential compression devices, early ambulation, and chemoprophylaxis to prevent DVT.  Patient benefited maximally from hospital stay and there were no complications.    Recent vital signs: Patient Vitals for the past 24 hrs:  BP Temp Temp src Pulse Resp SpO2  11/17/21 1058 133/77 97.8 F (36.6 C) Oral 62 18 100 %  11/17/21 0749 133/73 97.6 F (36.4 C) -- 75 16 100 %  11/17/21 0426 (!) 126/93 98.1 F (36.7 C) Oral 66 18 100 %  11/16/21 2322 117/67 97.7 F (36.5 C) Oral 62 20 100 %  11/16/21 1943 125/69 98 F (36.7 C) Oral 69 18 99 %  11/16/21 1610 136/67 97.9 F (36.6 C) Oral 77 18 100 %     Recent laboratory studies:  Recent Labs    11/15/21 1158 11/16/21 0747  WBC  --  13.2*  HGB 10.9* 8.8*  HCT 32.0* 28.7*  PLT  --  428*  NA 138 132*  K 3.2* 3.8  CL 101 98  CO2  --  21*  BUN 14 14  CREATININE 1.10 1.25*  GLUCOSE 49* 162*  CALCIUM  --  9.3  Discharge Medications:   Allergies as of 11/17/2021       Reactions   Hydrochlorothiazide    Other reaction(s): hyponatremia   Metformin Hcl    Other reaction(s): upset stomach        Medication List     STOP taking these medications    amoxicillin-clavulanate 875-125 MG tablet Commonly known as: Augmentin   HYDROcodone-acetaminophen 5-325 MG tablet Commonly known as: Norco       TAKE these medications    ALPRAZolam 0.5 MG tablet Commonly known as: XANAX Take 0.5 mg by mouth 2 (two) times daily as needed for anxiety.   amLODipine 10 MG tablet Commonly known as: NORVASC Take 10 mg by mouth at bedtime.   aspirin 325 MG tablet Take 325 mg by mouth daily.   atorvastatin 80 MG tablet Commonly known as: LIPITOR Take 80 mg by mouth every evening.   buPROPion 300 MG 24  hr tablet Commonly known as: WELLBUTRIN XL Take 300 mg by mouth every morning.   fenofibrate 160 MG tablet Take 160 mg by mouth every evening.   fish oil-omega-3 fatty acids 1000 MG capsule Take 1 g by mouth daily.   HumaLOG Mix 50/50 KwikPen (50-50) 100 UNIT/ML Kwikpen Generic drug: Insulin Lispro Prot & Lispro Inject 40 Units into the skin 3 (three) times daily before meals.   metoprolol tartrate 100 MG tablet Commonly known as: LOPRESSOR Take 100 mg by mouth 2 (two) times daily.   naloxone 4 MG/0.1ML Liqd nasal spray kit Commonly known as: NARCAN Place 1 spray into the nose daily.   NovoFine 32G X 6 MM Misc Generic drug: Insulin Pen Needle   olmesartan-hydrochlorothiazide 40-25 MG tablet Commonly known as: BENICAR HCT Take 1 tablet by mouth every morning.   omeprazole 20 MG capsule Commonly known as: PRILOSEC Take 20 mg by mouth every evening.   ondansetron 4 MG tablet Commonly known as: Zofran Take 1 tablet (4 mg total) by mouth every 8 (eight) hours as needed for nausea or vomiting.   oxyCODONE-acetaminophen 5-325 MG tablet Commonly known as: Percocet Take 1-2 tablets by mouth every 6 (six) hours as needed for severe pain.   polyethylene glycol 17 g packet Commonly known as: MIRALAX / GLYCOLAX Take 17 g by mouth daily as needed for moderate constipation.   potassium chloride SA 20 MEQ tablet Commonly known as: KLOR-CON M Take 40 mEq by mouth every morning.   Santyl ointment Generic drug: collagenase Apply 1 application topically daily.               Durable Medical Equipment  (From admission, onward)           Start     Ordered   11/15/21 1501  DME Walker rolling  Once       Question:  Patient needs a walker to treat with the following condition  Answer:  History of open reduction and internal fixation (ORIF) procedure   11/15/21 1500   11/15/21 1501  DME 3 n 1  Once        11/15/21 1500   11/15/21 1501  DME Bedside commode  Once        Question:  Patient needs a bedside commode to treat with the following condition  Answer:  History of open reduction and internal fixation (ORIF) procedure   11/15/21 1500            Diagnostic Studies: [place] EKG SITE RITE  Result Date: 11/16/2021 If Site Rite image not attached,  placement could not be confirmed due to current cardiac rhythm.   Disposition: Discharge disposition: 01-Home or Self Care          Follow-up Information     Leandrew Koyanagi, MD. Schedule an appointment as soon as possible for a visit in 1 week(s).   Specialty: Orthopedic Surgery Contact information: [address] [place] [address] [phone]                  Signed: Aundra Dubin 11/17/2021, 3:06 PM

## 2021-11-17 NOTE — Progress Notes (Signed)
Discharge instructions/education/AVS/Rx given to patient with wife at bedside and they both verbalized understanding. Patient to be discharged with PICC line for antibiotic therapy. All home care needs are resolved and settled. PAin is mild to moderate on operative site. No drainage with swelling noted on site. Patient very anxious to go home. Awaiting for transport.

## 2021-11-17 NOTE — Evaluation (Signed)
Occupational Therapy Evaluation Patient Details Name: [name] [name] MRN: [id number] DOB: 1956/02/27 Today's Date: 11/17/2021   History of Present Illness Pt adm 12/27 with left knee effusion and possible septic arthritis and underwent lt knee arthroscopic I&D. Pt with recent (11/02/2021) excisional debridement of lt calf abscess.  PMH - rt great toe amputation 07/2021, CAD, HTN, CABG, DM,   Clinical Impression   Pt admitted for concerns listed below. PTA pt reported that he was independent, WC level, with all ADL's and functional mobility. At this time, pt presents with increased mobility, able to stand and take steps with mild pain. Pt also demonstrated independence with basic ADL's this session. He has no further OT needs at this time and acute OT will sign off.       Recommendations for follow up therapy are one component of a multi-disciplinary discharge planning process, led by the attending physician.  Recommendations may be updated based on patient status, additional functional criteria and insurance authorization.   Follow Up Recommendations  No OT follow up    Assistance Recommended at Discharge Set up Supervision/Assistance  Functional Status Assessment  Patient has had a recent decline in their functional status and demonstrates the ability to make significant improvements in function in a reasonable and predictable amount of time.  Equipment Recommendations  Other (comment)    Recommendations for Other Services       Precautions / Restrictions Precautions Precautions: Fall Restrictions Weight Bearing Restrictions: No LLE Weight Bearing: Weight bearing as tolerated      Mobility Bed Mobility Overal bed mobility: Modified Independent             General bed mobility comments: Pt up in recliner on arrive    Transfers Overall transfer level: Needs assistance Equipment used: Rolling walker (2 wheels) Transfers: Sit to/from Stand Sit to Stand: Min guard            General transfer comment: assist for safety, mild instability with initial stance      Balance Overall balance assessment: Needs assistance Sitting-balance support: Feet supported;No upper extremity supported Sitting balance-Leahy Scale: Good     Standing balance support: Bilateral upper extremity supported;During functional activity;Reliant on assistive device for balance Standing balance-Leahy Scale: Poor                             ADL either performed or assessed with clinical judgement   ADL Overall ADL's : At baseline;Modified independent                                       General ADL Comments: Pt able to complete basic ADL's and functional mobility for short distances with no assist, min guard with mobility due to balance deficits.     Vision Baseline Vision/History: 1 Wears glasses Ability to See in Adequate Light: 0 Adequate Patient Visual Report: No change from baseline Vision Assessment?: No apparent visual deficits     Perception     Praxis      Pertinent Vitals/Pain Pain Assessment: 0-10 Pain Score: 2  Faces Pain Scale: Hurts a little bit Pain Location: left knee Pain Descriptors / Indicators: Sore Pain Intervention(s): Monitored during session;Repositioned     Hand Dominance Right   Extremity/Trunk Assessment Upper Extremity Assessment Upper Extremity Assessment: Overall WFL for tasks assessed   Lower Extremity Assessment Lower Extremity Assessment: Defer  to PT evaluation   Cervical / Trunk Assessment Cervical / Trunk Assessment: Normal   Communication Communication Communication: No difficulties   Cognition Arousal/Alertness: Awake/alert Behavior During Therapy: WFL for tasks assessed/performed Overall Cognitive Status: Within Functional Limits for tasks assessed                                       General Comments  VSS on RA    Exercises General Exercises - Lower  Extremity Ankle Circles/Pumps: AROM;Both;20 reps;Supine Quad Sets: Left;15 reps;AROM;Supine Short Arc Quad: AAROM;Left;5 reps Heel Slides: AROM;Left;10 reps;Supine Hip ABduction/ADduction: AROM;Left;10 reps;Supine Straight Leg Raises: AAROM;Left;5 reps;Supine   Shoulder Instructions      Home Living Family/patient expects to be discharged to:: Private residence Living Arrangements: Spouse/significant other Available Help at Discharge: Family Type of Home: House Home Access: Ramped entrance     Home Layout: One level     Bathroom Shower/Tub: Chief Strategy Officer: Handicapped height Bathroom Accessibility: Yes How Accessible: Accessible via wheelchair Home Equipment: Tub bench;Wheelchair - manual          Prior Functioning/Environment Prior Level of Function : Independent/Modified Independent             Mobility Comments: modI with wheelchair mobility ADLs Comments: Indep        OT Problem List: Decreased activity tolerance;Impaired balance (sitting and/or standing);Pain      OT Treatment/Interventions:      OT Goals(Current goals can be found in the care plan section) Acute Rehab OT Goals Patient Stated Goal: RW OT Goal Formulation: All assessment and education complete, DC therapy Time For Goal Achievement: 11/17/21 Potential to Achieve Goals: Good  OT Frequency:     Barriers to D/C:            Co-evaluation              AM-PAC OT "6 Clicks" Daily Activity     Outcome Measure Help from another person eating meals?: None Help from another person taking care of personal grooming?: None Help from another person toileting, which includes using toliet, bedpan, or urinal?: A Little Help from another person bathing (including washing, rinsing, drying)?: A Little Help from another person to put on and taking off regular upper body clothing?: None Help from another person to put on and taking off regular lower body clothing?: A  Little 6 Click Score: 21   End of Session Equipment Utilized During Treatment: Gait belt;Rolling walker (2 wheels) Nurse Communication: Mobility status  Activity Tolerance: Patient tolerated treatment well Patient left: in chair;with call bell/phone within reach  OT Visit Diagnosis: Unsteadiness on feet (R26.81);Other abnormalities of gait and mobility (R26.89);Muscle weakness (generalized) (M62.81);Pain Pain - Right/Left: Left Pain - part of body: Knee                Time: 2585-2778 OT Time Calculation (min): 17 min Charges:  OT General Charges $OT Visit: 1 Visit OT Evaluation $OT Eval Moderate Complexity: 1 Mod  Marielys Trinidad H., OTR/L Acute Rehabilitation  Pranit Owensby Elane Tishia Maestre 11/17/2021, 10:09 AM

## 2021-11-18 ENCOUNTER — Ambulatory Visit: Payer: 59 | Admitting: Podiatry

## 2021-11-20 LAB — AEROBIC/ANAEROBIC CULTURE W GRAM STAIN (SURGICAL/DEEP WOUND)
Culture: NO GROWTH
Culture: NO GROWTH

## 2021-11-21 ENCOUNTER — Encounter: Payer: Self-pay | Admitting: Infectious Disease

## 2021-11-23 ENCOUNTER — Other Ambulatory Visit: Payer: Self-pay

## 2021-11-23 ENCOUNTER — Encounter: Payer: Self-pay | Admitting: Orthopaedic Surgery

## 2021-11-23 ENCOUNTER — Ambulatory Visit (INDEPENDENT_AMBULATORY_CARE_PROVIDER_SITE_OTHER): Payer: 59 | Admitting: Orthopaedic Surgery

## 2021-11-23 DIAGNOSIS — M009 Pyogenic arthritis, unspecified: Secondary | ICD-10-CM

## 2021-11-23 NOTE — Progress Notes (Signed)
Post-Op Visit Note   Patient: [name] [name]           Date of Birth: 12/04/1955           MRN: [id number] Visit Date: 11/23/2021 PCP: Antony Contras, MD   Assessment & Plan:  Chief Complaint:  Chief Complaint  Patient presents with   Left Knee - Routine Post Op   Visit Diagnoses:  1. Pyogenic arthritis of left knee joint, due to unspecified organism [facility])     Plan: Patient is a pleasant 66 year old gentleman who comes in today approximately 1 week status post left knee irrigation debridement from septic arthritis, date of surgery 11/15/2021.  Intraoperative cell count was 75,000 with cultures negative to date.  He was however on chronic Augmentin following IV antibiotics from previous right foot osteomyelitis which grew out GBS, strep intermedius and prepatellar with subsequent secondary bacteremia due to Streptococcus intermedius.  Today, he is doing much better.  He is currently on IV Rocephin via PICC line.  He has minimal to no pain to the left knee.  He is taking occasional Tylenol as needed.  He does have slight trouble with knee flexion due to swelling.  He denies any calf pain.  Scheduled to follow-up with infectious disease on 12/13/2020.  Examination of the left knee reveals fully healed surgical portals without complication.  He does have a fairly large effusion.  Calf is soft nontender.  Range of motion from 0 to 95 degrees.  He is neurovascular intact distally.  Today, sutures were removed and Steri-Strips applied.  Left knee was aspirated and approximately 30 cc of blood was obtained.  We will go ahead and start him in physical therapy to work on gait training and range of motion.  He will follow-up with [place] in 3 weeks time for recheck.  Call with concerns or questions.  Follow-Up Instructions: Return in about 3 weeks (around 12/14/2021).   Orders:  Orders Placed This Encounter  Procedures   Ambulatory referral to Physical Therapy   No orders of the defined types were placed  in this encounter.   Imaging: No results found.  PMFS History: Patient Active Problem List   Diagnosis Date Noted   Septic arthritis of knee, left ([place]) 11/11/2021   Mass of lower leg, left 11/02/2021   Amputation of right great toe ([place]) 10/31/2021   Anxiety 10/31/2021   Chronic kidney disease, stage 3b ([place]) 10/31/2021   Diabetic renal disease ([place]) 10/31/2021   Gastroesophageal reflux disease without esophagitis 10/31/2021   History of amputation of great toe ([place]) 10/31/2021   Hyperglycemia due to type 2 diabetes mellitus ([place]) 10/31/2021   Leukocytosis 10/31/2021   Long term (current) use of insulin ([place]) 10/31/2021   Malignant hypertensive chronic kidney disease 10/31/2021   Male hypogonadism 10/31/2021   Mass of left adrenal gland ([place]) 10/31/2021   Obstructive sleep apnea syndrome 10/31/2021   Other specified disorders of bladder 10/31/2021   Polyneuropathy due to type 2 diabetes mellitus ([place]) 10/31/2021   Baker's cyst, left 10/06/2021   Knee effusion, left 10/06/2021   Cyanosis 10/06/2021   Hyponatremia    AKI (acute kidney injury) ([place])    Elevated LFTs    Post-operative state    Cellulitis of left lower extremity    Streptococcal bacteremia    Cellulitis of right lower extremity    Osteomyelitis of great toe of right foot ([place])    Osteomyelitis of second toe of right foot ([place])    Diabetes mellitus  due to underlying condition with diabetic autonomic neuropathy, without long-term current use of insulin (HCC)    Class 3 severe obesity with serious comorbidity and body mass index (BMI) of 45.0 to 49.9 in adult [facility])    Toe infection 08/08/2021   CAD (coronary artery disease) of artery bypass graft 04/21/2015   Hyperlipidemia 04/21/2015   Essential hypertension 04/21/2015   Type 2 diabetes mellitus ([place]) 04/21/2015   Ulcer of foot ([place]) 08/22/2014   Past Medical History:  Diagnosis Date   Anxiety    Baker's cyst 10/06/2021   Bulbous urethral stricture     chronic--- hx multiple dilatation's and urolume stent's   CAD (coronary artery disease) 04/ 2011  (previous cardiologist-  dr h. Tamala Julian , lov 06/ 2016)  currently followed by pcp   03-03-2010  CABG x 5, LIMA to LAD, left radial artery to OM1,SVG to R1, seqSVG to PDA and PLA   Cyanosis 10/06/2021   Depression    ED (erectile dysfunction)    Essential hypertension, benign    GERD (gastroesophageal reflux disease)    History of pancreatitis 2005   Knee effusion 10/06/2021   Mixed hyperlipidemia    OSA on CPAP    not used recently   S/P CABG x 5 03/03/2010   Septic arthritis of knee, left ([place]) 11/11/2021   Type II or unspecified type diabetes mellitus without mention of complication, not stated as uncontrolled    endocrinologist-  dr Buddy Duty    Family History  Problem Relation Age of Onset   Cancer Mother        lung   Hypertension Father    CAD Father    CVA Brother     Past Surgical History:  Procedure Laterality Date   AMPUTATION Right 08/09/2021   Procedure: AMPUTATION DIGIT;  Surgeon: Trula Slade, DPM;  [facility];  Service: Podiatry;  Laterality: Right;   CARDIAC CATHETERIZATION  03-01-2010   dr Daneen Schick   nuclear stress test --positive moderate ishemia:  severe multivessel CAD w/ total occlusion of pLAD, HG obstrucion of pPDA with supplies collaterals around the apex to the LAD, severe stenosis distal RCA, total occlusion OM1, 75% D1, normal LVF    CORONARY ARTERY BYPASS GRAFT  03-03-2010  dr hendrickson    LIMA to LAD, SVG to RI, left radial artery to OM1, seqSVG  to PDA and PLA   CYST EXCISION Left 11/02/2021   Procedure: REMOVAL OF LEFT CALF MASS;  Surgeon: Leandrew Koyanagi, MD;  Location: [place];  Service: Orthopedics;  Laterality: Left;   CYSTO W/ DILATATION , LASER INCISION OF URETHRAL STRICTURE  02-25-2005   dr Karsten Ro  [facility]   CYSTO/  DILATION URETHRAL MEATAL STENOSIS AND DILATION BULBOUS STRICTURE  12-21-2004   dr Terance Hart  [facility]   CYSTO/  RETROGRADE PYELOGRAM/ DILATATION URETHRAL STRICTURE AND PLACEMENT UROLUME STENT  08-04-2011    dr Karsten Ro  [facility]   CYSTOSCOPY WITH DIRECT VISION INTERNAL URETHROTOMY  07/15/2008   dr Karsten Ro  [facility]   and UroLume Stent placement   CYSTOSCOPY WITH URETHRAL DILATATION N/A 04/19/2018   Procedure: CYSTOSCOPY WITH BALLOON URETHRAL DILATATION;  Surgeon: Kathie Rhodes, MD;  Location: [facility];  Service: Urology;  Laterality: N/A;   INCISION AND DRAINAGE Right 08/13/2021   Procedure: INCISION AND DRAINAGE RIGHT FOOT WOUND;  Surgeon: Trula Slade, DPM;  [facility];  Service: Podiatry;  Laterality: Right;   KNEE ARTHROSCOPY Left 11/15/2021   Procedure: LEFT KNEE  ARTHROSCOPIC IRRIGATION AND DEBRIDEMENT;  Surgeon: Leandrew Koyanagi, MD;  Location: [place];  Service: Orthopedics;  Laterality: Left;   ORIF TOE FRACTURE Left 05/22/2013   Procedure: OPEN REDUCTION INTERNAL FIXATION (ORIF) LEFT SECOND TOE PROXIMAL PHALANX FRACTURE/LEFT PERCUTANEOUS PINNING;  Surgeon: Wylene Simmer, MD;  Location: [place];  Service: Orthopedics;  Laterality: Left;   TONSILLECTOMY  child   Social History   Occupational History   Not on file  Tobacco Use   Smoking status: Never   Smokeless tobacco: Never  Vaping Use   Vaping Use: Never used  Substance and Sexual Activity   Alcohol use: Yes    Comment: occasional   Drug use: No   Sexual activity: Not Currently

## 2021-11-24 ENCOUNTER — Encounter: Payer: Self-pay | Admitting: Physical Therapy

## 2021-11-24 ENCOUNTER — Ambulatory Visit: Payer: 59 | Admitting: Physical Therapy

## 2021-11-24 DIAGNOSIS — M6281 Muscle weakness (generalized): Secondary | ICD-10-CM

## 2021-11-24 DIAGNOSIS — R2681 Unsteadiness on feet: Secondary | ICD-10-CM | POA: Diagnosis not present

## 2021-11-24 DIAGNOSIS — M25562 Pain in left knee: Secondary | ICD-10-CM | POA: Diagnosis not present

## 2021-11-24 DIAGNOSIS — R262 Difficulty in walking, not elsewhere classified: Secondary | ICD-10-CM

## 2021-11-24 DIAGNOSIS — M25662 Stiffness of left knee, not elsewhere classified: Secondary | ICD-10-CM

## 2021-11-24 NOTE — Therapy (Signed)
[facility] [address] [place], [place], [address] Phone: [phone]   Fax:  [phone]  Physical Therapy Evaluation  Patient Details  Name: [name] [name] MRN: [id number] Date of Birth: October 15, 1956 Referring Provider (PT): Aundra Dubin, [place]   Encounter Date: 11/24/2021   PT End of Session - 11/24/21 1049     Visit Number 1    Number of Visits 12    Date for PT Re-Evaluation 01/19/22    Authorization Type UHC, 23 visit limit    Authorization - Number of Visits 23    PT Start Time 1009    PT Stop Time [id number]    PT Time Calculation (min) 36 min    Activity Tolerance Patient tolerated treatment well    Behavior During Therapy [facility] for tasks assessed/performed             Past Medical History:  Diagnosis Date   Anxiety    Baker's cyst 10/06/2021   Bulbous urethral stricture    chronic--- hx multiple dilatation's and urolume stent's   CAD (coronary artery disease) 04/ 2011  (previous cardiologist-  dr h. Tamala Julian , lov 06/ 2016)  currently followed by pcp   03-03-2010  CABG x 5, LIMA to LAD, left radial artery to OM1,SVG to R1, seqSVG to PDA and PLA   Cyanosis 10/06/2021   Depression    ED (erectile dysfunction)    Essential hypertension, benign    GERD (gastroesophageal reflux disease)    History of pancreatitis 2005   Knee effusion 10/06/2021   Mixed hyperlipidemia    OSA on CPAP    not used recently   S/P CABG x 5 03/03/2010   Septic arthritis of knee, left ([place]) 11/11/2021   Type II or unspecified type diabetes mellitus without mention of complication, not stated as uncontrolled    endocrinologist-  dr Buddy Duty    Past Surgical History:  Procedure Laterality Date   AMPUTATION Right 08/09/2021   Procedure: AMPUTATION DIGIT;  Surgeon: Trula Slade, DPM;  [facility];  Service: Podiatry;  Laterality: Right;   CARDIAC CATHETERIZATION  03-01-2010   dr Daneen Schick   nuclear stress test --positive moderate ishemia:   severe multivessel CAD w/ total occlusion of pLAD, HG obstrucion of pPDA with supplies collaterals around the apex to the LAD, severe stenosis distal RCA, total occlusion OM1, 75% D1, normal LVF    CORONARY ARTERY BYPASS GRAFT  03-03-2010  dr hendrickson    LIMA to LAD, SVG to RI, left radial artery to OM1, seqSVG  to PDA and PLA   CYST EXCISION Left 11/02/2021   Procedure: REMOVAL OF LEFT CALF MASS;  Surgeon: Leandrew Koyanagi, MD;  Location: [place];  Service: Orthopedics;  Laterality: Left;   CYSTO W/ DILATATION , LASER INCISION OF URETHRAL STRICTURE  02-25-2005   dr Karsten Ro  [facility]   CYSTO/  DILATION URETHRAL MEATAL STENOSIS AND DILATION BULBOUS STRICTURE  12-21-2004   dr Terance Hart  [facility]   CYSTO/ RETROGRADE PYELOGRAM/ DILATATION URETHRAL STRICTURE AND PLACEMENT UROLUME STENT  08-04-2011    dr Karsten Ro  [facility]   CYSTOSCOPY WITH DIRECT VISION INTERNAL URETHROTOMY  07/15/2008   dr Karsten Ro  [facility]   and UroLume Stent placement   CYSTOSCOPY WITH URETHRAL DILATATION N/A 04/19/2018   Procedure: CYSTOSCOPY WITH BALLOON URETHRAL DILATATION;  Surgeon: Kathie Rhodes, MD;  Location: [facility];  Service: Urology;  Laterality: N/A;   INCISION AND DRAINAGE Right 08/13/2021   Procedure: INCISION  AND DRAINAGE RIGHT FOOT WOUND;  Surgeon: Trula Slade, DPM;  [facility];  Service: Podiatry;  Laterality: Right;   KNEE ARTHROSCOPY Left 11/15/2021   Procedure: LEFT KNEE ARTHROSCOPIC IRRIGATION AND DEBRIDEMENT;  Surgeon: Leandrew Koyanagi, MD;  Location: [place];  Service: Orthopedics;  Laterality: Left;   ORIF TOE FRACTURE Left 05/22/2013   Procedure: OPEN REDUCTION INTERNAL FIXATION (ORIF) LEFT SECOND TOE PROXIMAL PHALANX FRACTURE/LEFT PERCUTANEOUS PINNING;  Surgeon: Wylene Simmer, MD;  Location: [place];  Service: Orthopedics;  Laterality: Left;   TONSILLECTOMY  child    There were no vitals filed for this visit.    Subjective Assessment - 11/24/21 1005     Subjective Pt is a 66 y/o  male who presents to OPPT s/p Lt knee arthroscopic I&D on 11/15/22. Pt with recent (11/02/2021) excisional debridement of lt calf abscess.  He had Rt great toe amputation in Sept 2022.  He is here today in a w/c and c/o decreased ROM and pain.  He has been using w/c since September 2022.    Pertinent History anxiety, CAD s/p CABG x 5, DM, depression, OA in Lt knee    Limitations Standing;Walking    Patient Stated Goals walk even if it's with a walker, improve strength    Currently in Pain? Yes    Pain Score 1    up to 9/10; at best 1/10   Pain Location Knee    Pain Orientation Left    Pain Descriptors / Indicators Aching;Tightness    Pain Type Surgical pain;Acute pain    Pain Onset 1 to 4 weeks ago    Pain Frequency Constant    Aggravating Factors  standing, movement    Pain Relieving Factors rest, medication                [facility] PT Assessment - 11/24/21 1003       Assessment   Medical Diagnosis M00.9 (ICD-10-CM) - Pyogenic arthritis of left knee joint, due to unspecified organism [facility])    Referring Provider (PT) Aundra Dubin, PA-C    Onset Date/Surgical Date 11/02/21    Hand Dominance Right    Next MD Visit 12/14/21    Prior Therapy acute only      Precautions   Precautions Fall    Precaution Comments PICC in RUE      Restrictions   Weight Bearing Restrictions No      Balance Screen   Has the patient fallen in the past 6 months No    Has the patient had a decrease in activity level because of a fear of falling?  No    Is the patient reluctant to leave their home because of a fear of falling?  No      Home Social worker Private residence    Living Arrangements Spouse/significant other    Type of [place] entrance;Stairs to enter   temporary   Entrance Stairs-Number of Steps 3    Entrance Stairs-Rails None    Home Layout Two level;Able to live on main level with bedroom/bathroom    Home Equipment Wheelchair -  manual;Walker - 2 wheels;Cane - single point;Shower seat    Additional Comments min A for bathing, mod I for other ADLs      Prior Function   Level of Independence Independent;Requires assistive device for independence    Vocation Retired    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  Leisure travel; ski      Cognition   Overall Cognitive Status Within Functional Limits for tasks assessed      Observation/Other Assessments   Focus on Therapeutic Outcomes (FOTO)  27 (predicted 50)      ROM / Strength   AROM / PROM / Strength AROM;PROM;Strength      AROM   AROM Assessment Site Knee    Right/Left Knee Left;Right    Right Knee Extension 0    Left Knee Extension -38   seated LAQ   Left Knee Flexion 97      PROM   PROM Assessment Site Knee    Right/Left Knee Left    Left Knee Extension -3    Left Knee Flexion 109      Strength   Overall Strength Comments disuse atrophy noted in Lt quad    Strength Assessment Site Knee    Right/Left Knee Left    Left Knee Flexion 3-/5    Left Knee Extension 3-/5      Palpation   Palpation comment mild edema present in Lt knee      Transfers   Transfers Stand Pivot Transfers    Stand Pivot Transfers 6: Modified independent (Device/Increase time)      Ambulation/Gait   Gait Comments deferred today, utilizing w/c but does report household amb with RW                        Objective measurements completed on examination: See above findings.       [place] Adult PT Treatment/Exercise - 11/24/21 1004       Exercises   Exercises Other Exercises    Other Exercises  see pt instructions  - instructed in proper technique with trial reps PRN for understanding                     PT Education - 11/24/21 1005     Education Details HEP    Person(s) Educated Patient    Methods Explanation;Demonstration;Handout    Comprehension Verbalized understanding;Returned demonstration;Need further  instruction              PT Short Term Goals - 11/24/21 1006       PT SHORT TERM GOAL #1   Title Independent with initial HEP    Time 4    Period Weeks    Status New    Target Date 12/22/21      PT SHORT TERM GOAL #2   Title Lt knee AROM improved 0-10-100 for improved function    Time 4    Period Weeks    Status New    Target Date 12/22/21      PT SHORT TERM GOAL #3   Title balance assessment with LTGs to follow    Time 4    Period Weeks    Status New    Target Date 12/22/21               PT Long Term Goals - 11/24/21 1006       PT LONG TERM GOAL #1   Title Independent with final HEP    Time 8    Period Weeks    Status New    Target Date 01/19/22      PT LONG TERM GOAL #2   Title Lt knee AROM improved 0-110 for improved function    Time 8    Period Weeks    Status New  Target Date 01/19/22      PT LONG TERM GOAL #3   Title Amb mod I with RW at least 250' for improved function    Time 8    Period Weeks    Status New    Target Date 01/19/22      PT LONG TERM GOAL #4   Title Report pain < 2/10 with activity for improved function    Time 8    Period Weeks    Status New    Target Date 01/19/22      PT LONG TERM GOAL #5   Title FOTO score improved to 50 for improved function    Time 8    Period Weeks    Status New    Target Date 01/19/22                    Plan - 11/24/21 1036     Clinical Impression Statement Pt is a 66 y/o male who presents to OPPT s/p Lt knee arthroscopic I&D on 11/15/22. Pt with recent (11/02/2021) excisional debridement of lt calf abscess. He had Rt great toe amputation in Sept 2022.  He demonstrates decreased strength and ROM, as well as overall decreased mobility and balance affecting functional mobility.  Will benefit from PT to address deficits listed.    Personal Factors and Comorbidities Time since onset of injury/illness/exacerbation;Past/Current Experience;Comorbidity 3+    Comorbidities anxiety,  CAD s/p CABG x 5, DM, depression    Examination-Activity Limitations Bathing;Locomotion Level;Transfers;Bed Mobility;Stairs;Stand;Lift;Carry    Examination-Participation Restrictions Meal Prep;Community Activity    Stability/Clinical Decision Making Unstable/Unpredictable    Clinical Decision Making High    Rehab Potential Good    PT Frequency Other (comment)   2x/wk x 4 wks then 1x/wk x 4 wks   PT Duration 8 weeks    PT Treatment/Interventions ADLs/Self Care Home Management;Cryotherapy;Electrical Stimulation;DME Instruction;Moist Heat;Gait training;Stair training;Functional mobility training;Therapeutic activities;Therapeutic exercise;Balance training;Patient/family education;Neuromuscular re-education;Manual techniques;Taping;Passive range of motion;Vasopneumatic Device    PT Next Visit Plan review HEP, focus on quad activation and maximizing ROM, endurance, needs balance testing (5x STS, TUG, GV)    PT Home Exercise Plan Access Code: [email or web address]    Consulted and Agree with Plan of Care Patient;Family member/caregiver    Family Member Consulted wife             Patient will benefit from skilled therapeutic intervention in order to improve the following deficits and impairments:  Abnormal gait, Pain, Decreased mobility, Decreased range of motion, Decreased endurance, Decreased activity tolerance, Decreased balance, Decreased strength, Impaired flexibility, Increased edema, Difficulty walking  Visit Diagnosis: Acute pain of left knee - Plan: PT plan of care cert/re-cert  Stiffness of left knee, not elsewhere classified - Plan: PT plan of care cert/re-cert  Unsteadiness on feet - Plan: PT plan of care cert/re-cert  Difficulty in walking, not elsewhere classified - Plan: PT plan of care cert/re-cert  Muscle weakness (generalized) - Plan: PT plan of care cert/re-cert     Problem List Patient Active Problem List   Diagnosis Date Noted   Septic arthritis of knee, left ([place]) 11/11/2021    Mass of lower leg, left 11/02/2021   Amputation of right great toe ([place]) 10/31/2021   Anxiety 10/31/2021   Chronic kidney disease, stage 3b ([place]) 10/31/2021   Diabetic renal disease ([place]) 10/31/2021   Gastroesophageal reflux disease without esophagitis 10/31/2021   History of amputation of great toe ([place]) 10/31/2021   Hyperglycemia due to type 2 diabetes mellitus (  [place]) 10/31/2021   Leukocytosis 10/31/2021   Long term (current) use of insulin ([place]) 10/31/2021   Malignant hypertensive chronic kidney disease 10/31/2021   Male hypogonadism 10/31/2021   Mass of left adrenal gland ([place]) 10/31/2021   Obstructive sleep apnea syndrome 10/31/2021   Other specified disorders of bladder 10/31/2021   Polyneuropathy due to type 2 diabetes mellitus ([place]) 10/31/2021   Baker's cyst, left 10/06/2021   Knee effusion, left 10/06/2021   Cyanosis 10/06/2021   Hyponatremia    AKI (acute kidney injury) ([place])    Elevated LFTs    Post-operative state    Cellulitis of left lower extremity    Streptococcal bacteremia    Cellulitis of right lower extremity    Osteomyelitis of great toe of right foot ([place])    Osteomyelitis of second toe of right foot ([place])    Diabetes mellitus due to underlying condition with diabetic autonomic neuropathy, without long-term current use of insulin (HCC)    Class 3 severe obesity with serious comorbidity and body mass index (BMI) of 45.0 to 49.9 in adult [facility])    Toe infection 08/08/2021   CAD (coronary artery disease) of artery bypass graft 04/21/2015   Hyperlipidemia 04/21/2015   Essential hypertension 04/21/2015   Type 2 diabetes mellitus ([place]) 04/21/2015   Ulcer of foot ([place]) 08/22/2014      Laureen Abrahams, PT, DPT 11/24/21 10:55 AM   [facility] [address] [place], [place], [address] Phone: [phone]   Fax:  [phone]  Name: JAYMOND DEGRAND MRN: [id number] Date of Birth: [birth year]

## 2021-11-24 NOTE — Patient Instructions (Signed)
Access Code: [email or web address] URL: [email or web address] Date: 11/24/2021 Prepared by: Moshe Cipro  Exercises Quad Setting and Stretching - 5-10 x daily - 7 x weekly - 1 sets - 5-10 reps - 5 sec hold Supine Heel Slide with Strap - 3-5 x daily - 7 x weekly - 1 sets - 10 reps Seated Knee Extension AROM - 3-5 x daily - 7 x weekly - 1 sets - 10 reps - 5 sec hold Seated Knee Flexion AAROM - 3-5 x daily - 7 x weekly - 1 sets - 10 reps - 10 sec hold

## 2021-11-28 ENCOUNTER — Ambulatory Visit (INDEPENDENT_AMBULATORY_CARE_PROVIDER_SITE_OTHER): Payer: 59

## 2021-11-28 ENCOUNTER — Other Ambulatory Visit: Payer: Self-pay

## 2021-11-28 ENCOUNTER — Ambulatory Visit: Payer: 59 | Admitting: Podiatry

## 2021-11-28 ENCOUNTER — Telehealth: Payer: Self-pay | Admitting: Urology

## 2021-11-28 DIAGNOSIS — M898X7 Other specified disorders of bone, ankle and foot: Secondary | ICD-10-CM

## 2021-11-28 DIAGNOSIS — L97512 Non-pressure chronic ulcer of other part of right foot with fat layer exposed: Secondary | ICD-10-CM

## 2021-11-28 DIAGNOSIS — E1149 Type 2 diabetes mellitus with other diabetic neurological complication: Secondary | ICD-10-CM

## 2021-11-28 NOTE — Progress Notes (Signed)
Subjective: [name] [name] is a 66 y.o. is seen today for evaluation of wounds of his right foot.  He recently went excision of Baker's cyst on his left leg as well as for admission to hospital for likely septic arthritis of his left knee.  He is back on IV antibiotics for this.  The right foot is been doing well as well as left big toe and the wounds of healed.  He has developed prominent area below the medial first metatarsal on the right foot which is causing irritation.  Denies any skin breakdown swelling or redness or any drainage at this time.  No fevers or chills.  No other concerns.  He is now walking with a walker.  Objective: General: No acute distress, AAOx3  DP/PT pulses palpable 2/4, CRT < 3 sec to all digits.  Right foot: Wounds are all well-healed incisions are also well-healed.  There is no open sores.  There is a prominence of the medial aspect of the first metatarsal head.  This is causing a preulcerative area but today there is no ulcerations or signs of infection.  There is no drainage or pus.  No fluctuation or crepitation. Left: Wound at the distal portion of toe is healed.  No open lesion today. There is no skin breakdown or warmth.  No erythema.  Assessment and Plan:  Right first metatarsal prominence resulting in preulcerative lesion  -Treatment options discussed including all alternatives, risks, and complications -X-rays obtained reviewed.  No evidence of acute fracture, osteomyelitis.  Increased first metatarsal angle. -Given the preulcerative lesion as well as the bony prominence I recommended surgical excision of the prominence.  We have discussed this previously and the patient and his wife were going to proceed with this.  Would likely plan on doing this next week for exostectomy, partial excision first metatarsal. -The incision placement as well as the postoperative course was discussed with the patient. I discussed risks of the surgery which include, but not limited  to, infection, bleeding, pain, swelling, need for further surgery, delayed or nonhealing, painful or ugly scar, numbness or sensation changes, recurrence, transfer lesions, further deformity, hardware failure, DVT/PE, loss of toe/foot. Patient understands these risks and wishes to proceed with surgery. The surgical consent was reviewed with the patient all 3 pages were signed. No promises or guarantees were given to the outcome of the procedure. All questions were answered to the best of my ability. Before the surgery the patient was encouraged to call the office if there is any further questions. The surgery will be performed at the [facility] on an outpatient basis.  Trula Slade DPM

## 2021-11-28 NOTE — Patient Instructions (Signed)

## 2021-11-28 NOTE — Telephone Encounter (Signed)
DOS - 1/18/223  EXOSTECTOMY 1ST RIGHT --- [id number]  UHC EFFECTIVE DATE - 11/20/21  PLAN DEDUCTIBLE - $3,250.00 W/ $3,250.00 REMAINING OUT OF POCKET - $8,000.00  W/ $8,000.00  REMAINING COINSURANCE - 50% COPAY - $0.00  Notification or Prior Authorization is not required for the requested services  This [employer] plan does not currently require a prior authorization for these services. If you have general questions about the prior authorization requirements, please call us at [phone] or visit [email or web address] > Clinician Resources > Advance and Admission Notification Requirements. The number above acknowledges your notification. Please write this number down for future reference. Notification is not a guarantee of coverage or payment.  Decision ID [id number]

## 2021-11-29 ENCOUNTER — Encounter: Payer: Self-pay | Admitting: Infectious Disease

## 2021-11-30 ENCOUNTER — Encounter: Payer: Self-pay | Admitting: Rehabilitative and Restorative Service Providers"

## 2021-11-30 ENCOUNTER — Ambulatory Visit: Payer: 59 | Admitting: Rehabilitative and Restorative Service Providers"

## 2021-11-30 ENCOUNTER — Other Ambulatory Visit: Payer: Self-pay

## 2021-11-30 DIAGNOSIS — R2681 Unsteadiness on feet: Secondary | ICD-10-CM | POA: Diagnosis not present

## 2021-11-30 DIAGNOSIS — R262 Difficulty in walking, not elsewhere classified: Secondary | ICD-10-CM

## 2021-11-30 DIAGNOSIS — M25562 Pain in left knee: Secondary | ICD-10-CM

## 2021-11-30 DIAGNOSIS — M6281 Muscle weakness (generalized): Secondary | ICD-10-CM | POA: Diagnosis not present

## 2021-11-30 DIAGNOSIS — M25662 Stiffness of left knee, not elsewhere classified: Secondary | ICD-10-CM

## 2021-11-30 NOTE — Therapy (Signed)
[facility] [address] [place], [place], [address] Phone: [phone]   Fax:  [phone]  Physical Therapy Treatment  Patient Details  Name: [name] [name] MRN: [id number] Date of Birth: May 31, 1956 Referring Provider (PT): Aundra Dubin, [place]   Encounter Date: 11/30/2021   PT End of Session - 11/30/21 1618     Visit Number 2    Number of Visits 12    Date for PT Re-Evaluation 01/19/22    Authorization Type UHC, 23 visit limit    Authorization - Number of Visits 23    PT Start Time 1517    PT Stop Time 1602    PT Time Calculation (min) 45 min    Equipment Utilized During Treatment Gait belt    Activity Tolerance Patient tolerated treatment well;Patient limited by fatigue    Behavior During Therapy [facility] for tasks assessed/performed             Past Medical History:  Diagnosis Date   Anxiety    Baker's cyst 10/06/2021   Bulbous urethral stricture    chronic--- hx multiple dilatation's and urolume stent's   CAD (coronary artery disease) 04/ 2011  (previous cardiologist-  dr h. Tamala Julian , lov 06/ 2016)  currently followed by pcp   03-03-2010  CABG x 5, LIMA to LAD, left radial artery to OM1,SVG to R1, seqSVG to PDA and PLA   Cyanosis 10/06/2021   Depression    ED (erectile dysfunction)    Essential hypertension, benign    GERD (gastroesophageal reflux disease)    History of pancreatitis 2005   Knee effusion 10/06/2021   Mixed hyperlipidemia    OSA on CPAP    not used recently   S/P CABG x 5 03/03/2010   Septic arthritis of knee, left ([place]) 11/11/2021   Type II or unspecified type diabetes mellitus without mention of complication, not stated as uncontrolled    endocrinologist-  dr Buddy Duty    Past Surgical History:  Procedure Laterality Date   AMPUTATION Right 08/09/2021   Procedure: AMPUTATION DIGIT;  Surgeon: Trula Slade, DPM;  Location: [facility];  Service: Podiatry;  Laterality: Right;   CARDIAC CATHETERIZATION   03-01-2010   dr Daneen Schick   nuclear stress test --positive moderate ishemia:  severe multivessel CAD w/ total occlusion of pLAD, HG obstrucion of pPDA with supplies collaterals around the apex to the LAD, severe stenosis distal RCA, total occlusion OM1, 75% D1, normal LVF    CORONARY ARTERY BYPASS GRAFT  03-03-2010  dr hendrickson    LIMA to LAD, SVG to RI, left radial artery to OM1, seqSVG  to PDA and PLA   CYST EXCISION Left 11/02/2021   Procedure: REMOVAL OF LEFT CALF MASS;  Surgeon: Leandrew Koyanagi, MD;  Location: [place];  Service: Orthopedics;  Laterality: Left;   CYSTO W/ DILATATION , LASER INCISION OF URETHRAL STRICTURE  02-25-2005   dr Karsten Ro  [facility]   CYSTO/  DILATION URETHRAL MEATAL STENOSIS AND DILATION BULBOUS STRICTURE  12-21-2004   dr Terance Hart  [facility]   CYSTO/ RETROGRADE PYELOGRAM/ DILATATION URETHRAL STRICTURE AND PLACEMENT UROLUME STENT  08-04-2011    dr Karsten Ro  [facility]   CYSTOSCOPY WITH DIRECT VISION INTERNAL URETHROTOMY  07/15/2008   dr Karsten Ro  [facility]   and UroLume Stent placement   CYSTOSCOPY WITH URETHRAL DILATATION N/A 04/19/2018   Procedure: CYSTOSCOPY WITH BALLOON URETHRAL DILATATION;  Surgeon: Kathie Rhodes, MD;  Location[facility];  Service: Urology;  Laterality:  N/A;   INCISION AND DRAINAGE Right 08/13/2021   Procedure: INCISION AND DRAINAGE RIGHT FOOT WOUND;  Surgeon: Trula Slade, DPM;  Location: [facility];  Service: Podiatry;  Laterality: Right;   KNEE ARTHROSCOPY Left 11/15/2021   Procedure: LEFT KNEE ARTHROSCOPIC IRRIGATION AND DEBRIDEMENT;  Surgeon: Leandrew Koyanagi, MD;  Location: [place];  Service: Orthopedics;  Laterality: Left;   ORIF TOE FRACTURE Left 05/22/2013   Procedure: OPEN REDUCTION INTERNAL FIXATION (ORIF) LEFT SECOND TOE PROXIMAL PHALANX FRACTURE/LEFT PERCUTANEOUS PINNING;  Surgeon: Wylene Simmer, MD;  Location: [place];  Service: Orthopedics;  Laterality: Left;   TONSILLECTOMY  child    There were no vitals filed for this  visit.   Subjective Assessment - 11/30/21 1616     Subjective Chuck reports good early HEP compliance.    Pertinent History anxiety, CAD s/p CABG x 5, DM, depression, OA in Lt knee    Limitations Standing;Walking    Patient Stated Goals walk even if it's with a walker, improve strength    Currently in Pain? Yes    Pain Score 2     Pain Location Knee    Pain Orientation Left    Pain Descriptors / Indicators Aching;Tightness;Sore    Pain Type Surgical pain;Acute pain    Pain Onset 1 to 4 weeks ago    Pain Frequency Constant    Aggravating Factors  WB or prolonged sitting (stationary postures)    Pain Relieving Factors Exercise and change of position    Effect of Pain on Daily Activities Uses a walker or wheelchair                [facility] PT Assessment - 11/30/21 0001       ROM / Strength   AROM / PROM / Strength AROM      AROM   AROM Assessment Site Knee    Right/Left Knee Left    Left Knee Extension -14    Left Knee Flexion 103      Balance   Balance Assessed Yes      Berg Balance Test   Sit to Stand Able to stand  independently using hands    Standing Unsupported Able to stand 30 seconds unsupported    Sitting with Back Unsupported but Feet Supported on Floor or Stool Able to sit safely and securely 2 minutes    Stand to Sit Controls descent by using hands    Transfers Able to transfer safely, definite need of hands    Standing Unsupported with Eyes Closed Able to stand 10 seconds with supervision    Standing Unsupported with Feet Together Able to place feet together independently and stand for 1 minute with supervision    From Standing, Reach Forward with Outstretched Arm Loses balance while trying/requires external support    From Standing Position, Pick up Object from Floor Able to pick up shoe, needs supervision    From Standing Position, Turn to Look Behind Over each Shoulder Needs assist to keep from losing balance and falling    Turn 360 Degrees Needs  assistance while turning    Standing Unsupported, Alternately Place Feet on Step/Stool Needs assistance to keep from falling or unable to try    Standing Unsupported, One Foot in [employer] balance while stepping or standing    Standing on One Leg Unable to try or needs assist to prevent fall    Total Score 24    Berg comment: Very unstable on L leg  [facility] Adult PT Treatment/Exercise - 11/30/21 0001       Exercises   Exercises Knee/Hip      Knee/Hip Exercises: Seated   Other Seated Knee/Hip Exercises Seated AAROM L knee flexion (R pushes L into flexion) 10X 10 seconds    Other Seated Knee/Hip Exercises Verbally reviewed supine L knee flexion with the belt      Knee/Hip Exercises: Supine   Quad Sets Strengthening;Both;2 sets;10 reps;Limitations    Quad Sets Limitations Work on keeping toes straight up, he tends to ER his hips.  Toes back, press knees down and tighten thighs.  5 second hold.                     PT Education - 11/30/21 1618     Education Details Physically and verbally reviewed HEP.  Reviewed Merrilee Jansky results.    Person(s) Educated Patient;Spouse    Methods Explanation;Demonstration;Tactile cues;Verbal cues    Comprehension Verbalized understanding;Returned demonstration;Tactile cues required;Need further instruction;Verbal cues required              PT Short Term Goals - 11/24/21 1006       PT SHORT TERM GOAL #1   Title Independent with initial HEP    Time 4    Period Weeks    Status New    Target Date 12/22/21      PT SHORT TERM GOAL #2   Title Lt knee AROM improved 0-10-100 for improved function    Time 4    Period Weeks    Status New    Target Date 12/22/21      PT SHORT TERM GOAL #3   Title balance assessment with LTGs to follow    Time 4    Period Weeks    Status New    Target Date 12/22/21               PT Long Term Goals - 11/24/21 1006       PT LONG TERM GOAL #1   Title  Independent with final HEP    Time 8    Period Weeks    Status New    Target Date 01/19/22      PT LONG TERM GOAL #2   Title Lt knee AROM improved 0-110 for improved function    Time 8    Period Weeks    Status New    Target Date 01/19/22      PT LONG TERM GOAL #3   Title Amb mod I with RW at least 250' for improved function    Time 8    Period Weeks    Status New    Target Date 01/19/22      PT LONG TERM GOAL #4   Title Report pain < 2/10 with activity for improved function    Time 8    Period Weeks    Status New    Target Date 01/19/22      PT LONG TERM GOAL #5   Title FOTO score improved to 50 for improved function    Time 8    Period Weeks    Status New    Target Date 01/19/22                   Plan - 11/30/21 1619     Clinical Impression Statement [name] [name] reports and demonstrates good HEP compliance.  Knee AROM is better than measured at evaluation a week ago (see objective).  Returning knee  AROM (particularly extension), quadriceps strengthening and edema control remain high early priorities.  Balance will also be implemented as a result of today's Berg test (24/56).    Personal Factors and Comorbidities Time since onset of injury/illness/exacerbation;Past/Current Experience;Comorbidity 3+    Comorbidities anxiety, CAD s/p CABG x 5, DM, depression    Examination-Activity Limitations Bathing;Locomotion Level;Transfers;Bed Mobility;Stairs;Stand;Lift;Carry    Examination-Participation Restrictions Meal Prep;Community Activity    Stability/Clinical Decision Making Unstable/Unpredictable    Rehab Potential Good    PT Frequency Other (comment)   2x/wk x 4 wks then 1x/wk x 4 wks   PT Duration 8 weeks    PT Treatment/Interventions ADLs/Self Care Home Management;Cryotherapy;Electrical Stimulation;DME Instruction;Moist Heat;Gait training;Stair training;Functional mobility training;Therapeutic activities;Therapeutic exercise;Balance training;Patient/family  education;Neuromuscular re-education;Manual techniques;Taping;Passive range of motion;Vasopneumatic Device    PT Next Visit Plan Continue quadriceps strength progression, static balance, knee AROM work (extension emphasis).    PT Home Exercise Plan Access Code: [email or web address]    Consulted and Agree with Plan of Care Patient;Family member/caregiver    Family Member Consulted wife             Patient will benefit from skilled therapeutic intervention in order to improve the following deficits and impairments:  Abnormal gait, Pain, Decreased mobility, Decreased range of motion, Decreased endurance, Decreased activity tolerance, Decreased balance, Decreased strength, Impaired flexibility, Increased edema, Difficulty walking  Visit Diagnosis: Difficulty in walking, not elsewhere classified  Unsteadiness on feet  Muscle weakness (generalized)  Stiffness of left knee, not elsewhere classified  Acute pain of left knee     Problem List Patient Active Problem List   Diagnosis Date Noted   Septic arthritis of knee, left ([place]) 11/11/2021   Mass of lower leg, left 11/02/2021   Amputation of right great toe ([place]) 10/31/2021   Anxiety 10/31/2021   Chronic kidney disease, stage 3b ([place]) 10/31/2021   Diabetic renal disease ([place]) 10/31/2021   Gastroesophageal reflux disease without esophagitis 10/31/2021   History of amputation of great toe ([place]) 10/31/2021   Hyperglycemia due to type 2 diabetes mellitus ([place]) 10/31/2021   Leukocytosis 10/31/2021   Long term (current) use of insulin ([place]) 10/31/2021   Malignant hypertensive chronic kidney disease 10/31/2021   Male hypogonadism 10/31/2021   Mass of left adrenal gland ([place]) 10/31/2021   Obstructive sleep apnea syndrome 10/31/2021   Other specified disorders of bladder 10/31/2021   Polyneuropathy due to type 2 diabetes mellitus ([place]) 10/31/2021   Baker's cyst, left 10/06/2021   Knee effusion, left 10/06/2021   Cyanosis 10/06/2021    Hyponatremia    AKI (acute kidney injury) ([place])    Elevated LFTs    Post-operative state    Cellulitis of left lower extremity    Streptococcal bacteremia    Cellulitis of right lower extremity    Osteomyelitis of great toe of right foot ([place])    Osteomyelitis of second toe of right foot ([place])    Diabetes mellitus due to underlying condition with diabetic autonomic neuropathy, without long-term current use of insulin ([facility])    Class 3 severe obesity with serious comorbidity and body mass index (BMI) of 45.0 to 49.9 in adult [facility])    Toe infection 08/08/2021   CAD (coronary artery disease) of artery bypass graft 04/21/2015   Hyperlipidemia 04/21/2015   Essential hypertension 04/21/2015   Type 2 diabetes mellitus ([place]) 04/21/2015   Ulcer of foot ([employer]) 08/22/2014    Farley Ly, PT, MPT 11/30/2021, 4:22 PM  [facility] [place] [facility] Physical [facility] [place],  [place], [address] Phone: [phone]   Fax:  [phone]  Name: [name] [name] MRN: [id number] Date of Birth: 1956-06-23

## 2021-11-30 NOTE — Patient Instructions (Signed)
Encouraged at least 100 quadriceps sets/day to regain extension AROM, quadriceps strength and help reduce knee edema

## 2021-12-02 ENCOUNTER — Ambulatory Visit: Payer: 59 | Admitting: Physical Therapy

## 2021-12-02 ENCOUNTER — Encounter: Payer: Self-pay | Admitting: Physical Therapy

## 2021-12-02 ENCOUNTER — Other Ambulatory Visit: Payer: Self-pay

## 2021-12-02 DIAGNOSIS — R2681 Unsteadiness on feet: Secondary | ICD-10-CM | POA: Diagnosis not present

## 2021-12-02 DIAGNOSIS — R262 Difficulty in walking, not elsewhere classified: Secondary | ICD-10-CM

## 2021-12-02 DIAGNOSIS — M6281 Muscle weakness (generalized): Secondary | ICD-10-CM | POA: Diagnosis not present

## 2021-12-02 DIAGNOSIS — M25662 Stiffness of left knee, not elsewhere classified: Secondary | ICD-10-CM | POA: Diagnosis not present

## 2021-12-02 DIAGNOSIS — M25562 Pain in left knee: Secondary | ICD-10-CM

## 2021-12-02 NOTE — Therapy (Signed)
[facility] [address] [place], [place], [address] Phone: [phone]   Fax:  [phone]  Physical Therapy Treatment  Patient Details  Name: [name] [name] MRN: [id number] Date of Birth: 09/18/1956 Referring Provider (PT): Aundra Dubin, [place]   Encounter Date: 12/02/2021   PT End of Session - 12/02/21 1101     Visit Number 3    Number of Visits 12    Date for PT Re-Evaluation 01/19/22    Authorization Type U[employer], 23 visit limit    Authorization - Number of Visits 23    PT Start Time 1015    PT Stop Time 1056    PT Time Calculation (min) 41 min    Equipment Utilized During Treatment Gait belt    Activity Tolerance Patient tolerated treatment well;Patient limited by fatigue    Behavior During Therapy [facility] for tasks assessed/performed             Past Medical History:  Diagnosis Date   Anxiety    Baker's cyst 10/06/2021   Bulbous urethral stricture    chronic--- hx multiple dilatation's and urolume stent's   CAD (coronary artery disease) 04/ 2011  (previous cardiologist-  dr h. Tamala Julian , lov 06/ 2016)  currently followed by pcp   03-03-2010  CABG x 5, LIMA to LAD, left radial artery to OM1,SVG to R1, seqSVG to PDA and PLA   Cyanosis 10/06/2021   Depression    ED (erectile dysfunction)    Essential hypertension, benign    GERD (gastroesophageal reflux disease)    History of pancreatitis 2005   Knee effusion 10/06/2021   Mixed hyperlipidemia    OSA on CPAP    not used recently   S/P CABG x 5 03/03/2010   Septic arthritis of knee, left ([place]) 11/11/2021   Type II or unspecified type diabetes mellitus without mention of complication, not stated as uncontrolled    endocrinologist-  dr Buddy Duty    Past Surgical History:  Procedure Laterality Date   AMPUTATION Right 08/09/2021   Procedure: AMPUTATION DIGIT;  Surgeon: Trula Slade, DPM;  Location: [facility];  Service: Podiatry;  Laterality: Right;   CARDIAC CATHETERIZATION   03-01-2010   dr Daneen Schick   nuclear stress test --positive moderate ishemia:  severe multivessel CAD w/ total occlusion of pLAD, HG obstrucion of pPDA with supplies collaterals around the apex to the LAD, severe stenosis distal RCA, total occlusion OM1, 75% D1, normal LVF    CORONARY ARTERY BYPASS GRAFT  03-03-2010  dr hendrickson    LIMA to LAD, SVG to RI, left radial artery to OM1, seqSVG  to PDA and PLA   CYST EXCISION Left 11/02/2021   Procedure: REMOVAL OF LEFT CALF MASS;  Surgeon: Leandrew Koyanagi, MD;  Location: [place];  Service: Orthopedics;  Laterality: Left;   CYSTO W/ DILATATION , LASER INCISION OF URETHRAL STRICTURE  02-25-2005   dr Karsten Ro  [facility]   CYSTO/  DILATION URETHRAL MEATAL STENOSIS AND DILATION BULBOUS STRICTURE  12-21-2004   dr Terance Hart  [facility]   CYSTO/ RETROGRADE PYELOGRAM/ DILATATION URETHRAL STRICTURE AND PLACEMENT UROLUME STENT  08-04-2011    dr Karsten Ro  [facility]   CYSTOSCOPY WITH DIRECT VISION INTERNAL URETHROTOMY  07/15/2008   dr Karsten Ro  [facility]   and UroLume Stent placement   CYSTOSCOPY WITH URETHRAL DILATATION N/A 04/19/2018   Procedure: CYSTOSCOPY WITH BALLOON URETHRAL DILATATION;  Surgeon: Kathie Rhodes, MD;  Location: [facility];  Service: Urology;  Laterality:  N/A;   INCISION AND DRAINAGE Right 08/13/2021   Procedure: INCISION AND DRAINAGE RIGHT FOOT WOUND;  Surgeon: Trula Slade, DPM;  Location: [facility];  Service: Podiatry;  Laterality: Right;   KNEE ARTHROSCOPY Left 11/15/2021   Procedure: LEFT KNEE ARTHROSCOPIC IRRIGATION AND DEBRIDEMENT;  Surgeon: Leandrew Koyanagi, MD;  Location: [place];  Service: Orthopedics;  Laterality: Left;   ORIF TOE FRACTURE Left 05/22/2013   Procedure: OPEN REDUCTION INTERNAL FIXATION (ORIF) LEFT SECOND TOE PROXIMAL PHALANX FRACTURE/LEFT PERCUTANEOUS PINNING;  Surgeon: Wylene Simmer, MD;  Location: [place];  Service: Orthopedics;  Laterality: Left;   TONSILLECTOMY  child    There were no vitals filed for this  visit.   Subjective Assessment - 12/02/21 1019     Subjective exercises are going pretty well, a little sore after doing multiple reps    Pertinent History anxiety, CAD s/p CABG x 5, DM, depression, OA in Lt knee    Limitations Standing;Walking    Patient Stated Goals walk even if it's with a walker, improve strength    Currently in Pain? Yes    Pain Score 4    was 8/10 this morning   Pain Location Knee    Pain Orientation Left    Pain Descriptors / Indicators Aching;Tightness;Sore    Pain Type Surgical pain;Acute pain    Pain Onset 1 to 4 weeks ago    Pain Frequency Constant    Aggravating Factors  WB, prolonged standing    Pain Relieving Factors exercises, medication                [facility] PT Assessment - 12/02/21 1034       Assessment   Medical Diagnosis M00.9 (ICD-10-CM) - Pyogenic arthritis of left knee joint, due to unspecified organism [facility])    Referring Provider (PT) Aundra Dubin, PA-C    Onset Date/Surgical Date 11/02/21      AROM   Left Knee Extension -17    Left Knee Flexion 109      PROM   Left Knee Extension 0                           [facility] Adult PT Treatment/Exercise - 12/02/21 1021       Knee/Hip Exercises: Aerobic   Nustep UE/LE L6 x 10 min      Knee/Hip Exercises: Seated   Long Arc Quad Left;10 reps    Long Arc Quad Limitations 5 sec hold    Heel Slides AAROM;Left;10 reps    Other Seated Knee/Hip Exercises Seated AAROM L knee flexion (R pushes L into flexion) 10X 10 seconds    Other Seated Knee/Hip Exercises seate SLR x10                       PT Short Term Goals - 11/24/21 1006       PT SHORT TERM GOAL #1   Title Independent with initial HEP    Time 4    Period Weeks    Status New    Target Date 12/22/21      PT SHORT TERM GOAL #2   Title Lt knee AROM improved 0-10-100 for improved function    Time 4    Period Weeks    Status New    Target Date 12/22/21      PT SHORT TERM GOAL #3   Title  balance assessment with LTGs to follow    Time  4    Period Weeks    Status New    Target Date 12/22/21               PT Long Term Goals - 11/24/21 1006       PT LONG TERM GOAL #1   Title Independent with final HEP    Time 8    Period Weeks    Status New    Target Date 01/19/22      PT LONG TERM GOAL #2   Title Lt knee AROM improved 0-110 for improved function    Time 8    Period Weeks    Status New    Target Date 01/19/22      PT LONG TERM GOAL #3   Title Amb mod I with RW at least 250' for improved function    Time 8    Period Weeks    Status New    Target Date 01/19/22      PT LONG TERM GOAL #4   Title Report pain < 2/10 with activity for improved function    Time 8    Period Weeks    Status New    Target Date 01/19/22      PT LONG TERM GOAL #5   Title FOTO score improved to 50 for improved function    Time 8    Period Weeks    Status New    Target Date 01/19/22                   Plan - 12/02/21 1101     Clinical Impression Statement Pt tolerated session well today with improvement in quad activation noted.  Will continue to benefit from PT to maximize function and mobility.    Personal Factors and Comorbidities Time since onset of injury/illness/exacerbation;Past/Current Experience;Comorbidity 3+    Comorbidities anxiety, CAD s/p CABG x 5, DM, depression    Examination-Activity Limitations Bathing;Locomotion Level;Transfers;Bed Mobility;Stairs;Stand;Lift;Carry    Examination-Participation Restrictions Meal Prep;Community Activity    Stability/Clinical Decision Making Unstable/Unpredictable    Rehab Potential Good    PT Frequency Other (comment)   2x/wk x 4 wks then 1x/wk x 4 wks   PT Duration 8 weeks    PT Treatment/Interventions ADLs/Self Care Home Management;Cryotherapy;Electrical Stimulation;DME Instruction;Moist Heat;Gait training;Stair training;Functional mobility training;Therapeutic activities;Therapeutic exercise;Balance  training;Patient/family education;Neuromuscular re-education;Manual techniques;Taping;Passive range of motion;Vasopneumatic Device    PT Next Visit Plan Continue quadriceps strength progression, static balance, knee AROM work (extension emphasis), need to write BERG goal    PT Home Exercise Plan Access Code: [email or web address]    Consulted and Agree with Plan of Care Patient;Family member/caregiver    Family Member Consulted wife             Patient will benefit from skilled therapeutic intervention in order to improve the following deficits and impairments:  Abnormal gait, Pain, Decreased mobility, Decreased range of motion, Decreased endurance, Decreased activity tolerance, Decreased balance, Decreased strength, Impaired flexibility, Increased edema, Difficulty walking  Visit Diagnosis: Difficulty in walking, not elsewhere classified  Unsteadiness on feet  Stiffness of left knee, not elsewhere classified  Muscle weakness (generalized)  Acute pain of left knee     Problem List Patient Active Problem List   Diagnosis Date Noted   Septic arthritis of knee, left ([place]) 11/11/2021   Mass of lower leg, left 11/02/2021   Amputation of right great toe ([place]) 10/31/2021   Anxiety 10/31/2021   Chronic kidney disease, stage 3b ([place]) 10/31/2021   Diabetic  renal disease ([place]) 10/31/2021   Gastroesophageal reflux disease without esophagitis 10/31/2021   History of amputation of great toe ([place]) 10/31/2021   Hyperglycemia due to type 2 diabetes mellitus ([place]) 10/31/2021   Leukocytosis 10/31/2021   Long term (current) use of insulin ([place]) 10/31/2021   Malignant hypertensive chronic kidney disease 10/31/2021   Male hypogonadism 10/31/2021   Mass of left adrenal gland ([place]) 10/31/2021   Obstructive sleep apnea syndrome 10/31/2021   Other specified disorders of bladder 10/31/2021   Polyneuropathy due to type 2 diabetes mellitus ([place]) 10/31/2021   Baker's cyst, left 10/06/2021   Knee effusion, left  10/06/2021   Cyanosis 10/06/2021   Hyponatremia    AKI (acute kidney injury) ([place])    Elevated LFTs    Post-operative state    Cellulitis of left lower extremity    Streptococcal bacteremia    Cellulitis of right lower extremity    Osteomyelitis of great toe of right foot ([place])    Osteomyelitis of second toe of right foot ([place])    Diabetes mellitus due to underlying condition with diabetic autonomic neuropathy, without long-term current use of insulin (HCC)    Class 3 severe obesity with serious comorbidity and body mass index (BMI) of 45.0 to 49.9 in adult [facility])    Toe infection 08/08/2021   CAD (coronary artery disease) of artery bypass graft 04/21/2015   Hyperlipidemia 04/21/2015   Essential hypertension 04/21/2015   Type 2 diabetes mellitus ([place]) 04/21/2015   Ulcer of foot ([place]) 08/22/2014      Laureen Abrahams, PT, DPT 12/02/21 11:02 AM    [facility] [address] [place], [place], [address] Phone: [phone]   Fax:  [phone]  Name: [name] [name] MRN: [id number] Date of Birth: [birth year]

## 2021-12-06 ENCOUNTER — Encounter: Payer: Self-pay | Admitting: Infectious Disease

## 2021-12-06 ENCOUNTER — Encounter: Payer: 59 | Admitting: Physical Therapy

## 2021-12-07 ENCOUNTER — Encounter: Payer: Self-pay | Admitting: Podiatry

## 2021-12-07 DIAGNOSIS — L97512 Non-pressure chronic ulcer of other part of right foot with fat layer exposed: Secondary | ICD-10-CM | POA: Diagnosis not present

## 2021-12-08 ENCOUNTER — Encounter: Payer: 59 | Admitting: Physical Therapy

## 2021-12-12 ENCOUNTER — Encounter: Payer: 59 | Admitting: Physical Therapy

## 2021-12-13 ENCOUNTER — Other Ambulatory Visit: Payer: Self-pay

## 2021-12-13 ENCOUNTER — Ambulatory Visit (INDEPENDENT_AMBULATORY_CARE_PROVIDER_SITE_OTHER): Payer: 59

## 2021-12-13 ENCOUNTER — Ambulatory Visit (INDEPENDENT_AMBULATORY_CARE_PROVIDER_SITE_OTHER): Payer: 59 | Admitting: Podiatry

## 2021-12-13 DIAGNOSIS — M898X7 Other specified disorders of bone, ankle and foot: Secondary | ICD-10-CM

## 2021-12-13 DIAGNOSIS — Z9889 Other specified postprocedural states: Secondary | ICD-10-CM | POA: Diagnosis not present

## 2021-12-14 ENCOUNTER — Encounter: Payer: Self-pay | Admitting: Rehabilitative and Restorative Service Providers"

## 2021-12-14 ENCOUNTER — Ambulatory Visit: Payer: 59 | Admitting: Rehabilitative and Restorative Service Providers"

## 2021-12-14 ENCOUNTER — Encounter: Payer: Self-pay | Admitting: Orthopaedic Surgery

## 2021-12-14 ENCOUNTER — Other Ambulatory Visit: Payer: Self-pay

## 2021-12-14 ENCOUNTER — Ambulatory Visit (INDEPENDENT_AMBULATORY_CARE_PROVIDER_SITE_OTHER): Payer: 59 | Admitting: Orthopaedic Surgery

## 2021-12-14 ENCOUNTER — Ambulatory Visit (INDEPENDENT_AMBULATORY_CARE_PROVIDER_SITE_OTHER): Payer: 59 | Admitting: Infectious Disease

## 2021-12-14 ENCOUNTER — Telehealth: Payer: Self-pay

## 2021-12-14 VITALS — BP 151/82 | HR 68 | Temp 98.2°F

## 2021-12-14 DIAGNOSIS — S98111A Complete traumatic amputation of right great toe, initial encounter: Secondary | ICD-10-CM

## 2021-12-14 DIAGNOSIS — M009 Pyogenic arthritis, unspecified: Secondary | ICD-10-CM

## 2021-12-14 DIAGNOSIS — I10 Essential (primary) hypertension: Secondary | ICD-10-CM

## 2021-12-14 DIAGNOSIS — R262 Difficulty in walking, not elsewhere classified: Secondary | ICD-10-CM | POA: Diagnosis not present

## 2021-12-14 DIAGNOSIS — M1712 Unilateral primary osteoarthritis, left knee: Secondary | ICD-10-CM

## 2021-12-14 DIAGNOSIS — E0843 Diabetes mellitus due to underlying condition with diabetic autonomic (poly)neuropathy: Secondary | ICD-10-CM | POA: Diagnosis not present

## 2021-12-14 DIAGNOSIS — M869 Osteomyelitis, unspecified: Secondary | ICD-10-CM | POA: Diagnosis not present

## 2021-12-14 DIAGNOSIS — R2681 Unsteadiness on feet: Secondary | ICD-10-CM | POA: Diagnosis not present

## 2021-12-14 DIAGNOSIS — M25662 Stiffness of left knee, not elsewhere classified: Secondary | ICD-10-CM

## 2021-12-14 DIAGNOSIS — M25562 Pain in left knee: Secondary | ICD-10-CM

## 2021-12-14 DIAGNOSIS — M6281 Muscle weakness (generalized): Secondary | ICD-10-CM

## 2021-12-14 NOTE — Progress Notes (Signed)
Subjective:  Chief complaint: Follow-up for septic arthritis now with some pain after repeat surgery on his right foot by podiatry   Patient ID: [name] [name], male    DOB: [birth year], 66 y.o.   MRN: [id number]  HPI  66 y.o. male with diabetes mellitus obesity, coronary artery disease who had been struggling with bilateral diabetic foot ulcers now admitted with acute worsening of drainage from his right great toe also with onset of erythema consistent with cellulitis in his left lower extremity.  Plain films of shown osteomyelitis in the still phalanx of the great toe and second toe on the right.   RI showed  the ulceration that is obvious on exam with communication of the joint space and first IP joint with septic arthritis and osteomyelitis of first proximal and distal phalanges with pathological fracture of the base of the first distal phalanx with ulceration of the tip of the second toe with underlying osteomyelitis the second distal phalanx tear of the flexor pollicis longus and infectious tenosynovitis with multiple forefoot abscess   He also has an ulcer in his left great toe as well   Fortunately MRI did not show evidence of osteomyelitis in that foot  Patient was taken the operating room and went underwent first toe and partial amputation of second toe.  Cultures grew Streptococcus intermedius and group B streptococcus as well as a beta-lactamase positive Prevotella.  Grew strep intermedius from blood cultures as well.  2D echocardiogram failed to show evidence of endocarditis.  Why was in the hospital I last saw him we had switched him to continuous penicillin along with oral metronidazole.  He ended up changing eventually to ceftriaxone and metronidazole and was discharged home with plans to complete antibiotics on 20 October.  He has been followed closely by Dr. Jacqualyn Posey with podiatry and who was happy with the progress of the foot.   He did have worsening swelling in his  left leg where he was f thought to have a Baker's cyst when he was in the hospital  However the edema worsened and ultimately we referred him to orthopedic surgery and he saw Dr. Erlinda Hong.  He mass had enlarged since then.  Dr. Erlinda Hong performed an aspirate which I believe was from the patients knee with blood-tinged fluid isolated which had 6263 white blood cells with 89% neutrophils 3% lymphocytes gram stain and culture were negative though keep in mind the patient has been on antibiotics.  MRI of the leg was obtained which showed a small knee effusion but with extensive synovitis and edema in the joint margins as well as lateral tibial plateau with some erosive changes that radiology felt could be due to septic joint and early osteomyelitis versus an erosive arthropathy.  There is also medial and lateral meniscal tears, and abnormal ACL with fluid expansion in the area they are compatible with CS cyst and a multilobulated fluid collection posterior to the joint which could be ganglion cyst with also a large Baker's cyst extending down into the calf.  Dr Erlinda Hong performed surgery December 14 with excisional debridement of a large left Calf abscess that was with 700 cm of subcutaneous tissue muscle and pus.  Had culture sent but they did not yield an organism keeping in mind he has been on Augmentin.  He completed Augmentin but was restarted on by orthopedic surgery.  His knee effusion had persisted and he was ultimately taken back to the operating room by Dr. Erlinda Hong who performed left knee  arthroscopy with synovectomy and left knee arthroscopy and arthroscopic partial medial meniscectomy as well as arthroscopic chondroplasty and medial femoral condyle and the patellar surface.  Cultures were unrevealing.  He was seen by my partner Dr. Juleen China and Janene Madeira and ultimately placed on ceftriaxone with plans for him to complete therapy 4 weeks postoperatively which will be today.  He has 1 more dose of ceftriaxone at  home and would like to take this before the PICC line is discontinued.  He was moving around with PT and walking more more but then he did require further surgery to his right foot though I cannot find the note or operative report in epic.  He states that this was done not due to infection but to try to prevent further potential friction at the site.  Right foot is bandaged currently.  Knee pain has improved dramatically since surgery.              Past Medical History:  Diagnosis Date   Anxiety    Baker's cyst 10/06/2021   Bulbous urethral stricture    chronic--- hx multiple dilatation's and urolume stent's   CAD (coronary artery disease) 04/ 2011  (previous cardiologist-  dr h. Tamala Julian , lov 06/ 2016)  currently followed by pcp   03-03-2010  CABG x 5, LIMA to LAD, left radial artery to OM1,SVG to R1, seqSVG to PDA and PLA   Cyanosis 10/06/2021   Depression    ED (erectile dysfunction)    Essential hypertension, benign    GERD (gastroesophageal reflux disease)    History of pancreatitis 2005   Knee effusion 10/06/2021   Mixed hyperlipidemia    OSA on CPAP    not used recently   S/P CABG x 5 03/03/2010   Septic arthritis of knee, left ([place]) 11/11/2021   Type II or unspecified type diabetes mellitus without mention of complication, not stated as uncontrolled    endocrinologist-  dr Buddy Duty    Past Surgical History:  Procedure Laterality Date   AMPUTATION Right 08/09/2021   Procedure: AMPUTATION DIGIT;  Surgeon: Trula Slade, DPM;  Location: [facility];  Service: Podiatry;  Laterality: Right;   CARDIAC CATHETERIZATION  03-01-2010   dr Daneen Schick   nuclear stress test --positive moderate ishemia:  severe multivessel CAD w/ total occlusion of pLAD, HG obstrucion of pPDA with supplies collaterals around the apex to the LAD, severe stenosis distal RCA, total occlusion OM1, 75% D1, normal LVF    CORONARY ARTERY BYPASS GRAFT  03-03-2010  dr hendrickson    LIMA to LAD, SVG to  RI, left radial artery to OM1, seqSVG  to PDA and PLA   CYST EXCISION Left 11/02/2021   Procedure: REMOVAL OF LEFT CALF MASS;  Surgeon: Leandrew Koyanagi, MD;  Location: [place];  Service: Orthopedics;  Laterality: Left;   CYSTO W/ DILATATION , LASER INCISION OF URETHRAL STRICTURE  02-25-2005   dr Karsten Ro  [facility]   CYSTO/  DILATION URETHRAL MEATAL STENOSIS AND DILATION BULBOUS STRICTURE  12-21-2004   dr Terance Hart  [facility]   CYSTO/ RETROGRADE PYELOGRAM/ DILATATION URETHRAL STRICTURE AND PLACEMENT UROLUME STENT  08-04-2011    dr Karsten Ro  [facility]   CYSTOSCOPY WITH DIRECT VISION INTERNAL URETHROTOMY  07/15/2008   dr Karsten Ro  [facility]   and UroLume Stent placement   CYSTOSCOPY WITH URETHRAL DILATATION N/A 04/19/2018   Procedure: CYSTOSCOPY WITH BALLOON URETHRAL DILATATION;  Surgeon: Kathie Rhodes, MD;  Location: [facility];  Service: Urology;  Laterality: N/A;   INCISION AND DRAINAGE Right 08/13/2021   Procedure: INCISION AND DRAINAGE RIGHT FOOT WOUND;  Surgeon: Vivi BarrackWagoner, Matthew R, DPM;  Location: [facility];  Service: Podiatry;  Laterality: Right;   KNEE ARTHROSCOPY Left 11/15/2021   Procedure: LEFT KNEE ARTHROSCOPIC IRRIGATION AND DEBRIDEMENT;  Surgeon: Tarry KosXu, Naiping M, MD;  Location: [facility];  Service: Orthopedics;  Laterality: Left;   ORIF TOE FRACTURE Left 05/22/2013   Procedure: OPEN REDUCTION INTERNAL FIXATION (ORIF) LEFT SECOND TOE PROXIMAL PHALANX FRACTURE/LEFT PERCUTANEOUS PINNING;  Surgeon: Toni ArthursJohn Hewitt, MD;  Location: [facility];  Service: Orthopedics;  Laterality: Left;   TONSILLECTOMY  child    Family History  Problem Relation Age of Onset   Cancer Mother        lung   Hypertension Father    CAD Father    CVA Brother       Social History   Socioeconomic History   Marital status: Married    Spouse name: Not on file   Number of children: Not on file   Years of education: Not on file   Highest education level: Not on file  Occupational History   Not on file  Tobacco Use    Smoking status: Never   Smokeless tobacco: Never  Vaping Use   Vaping Use: Never used  Substance and Sexual Activity   Alcohol use: Yes    Comment: occasional   Drug use: No   Sexual activity: Not Currently  Other Topics Concern   Not on file  Social History Narrative   Not on file   Social Determinants of Health   Financial Resource Strain: Not on file  Food Insecurity: Not on file  Transportation Needs: Not on file  Physical Activity: Not on file  Stress: Not on file  Social Connections: Not on file    Allergies  Allergen Reactions   Hydrochlorothiazide     Other reaction(s): hyponatremia   Metformin Hcl     Other reaction(s): upset stomach     Current Outpatient Medications:    ALPRAZolam (XANAX) 0.5 MG tablet, Take 0.5 mg by mouth 2 (two) times daily as needed for anxiety., Disp: , Rfl:    amLODipine (NORVASC) 10 MG tablet, Take 10 mg by mouth at bedtime., Disp: , Rfl:    aspirin 325 MG tablet, Take 325 mg by mouth daily., Disp: , Rfl:    atorvastatin (LIPITOR) 80 MG tablet, Take 80 mg by mouth every evening., Disp: , Rfl:    buPROPion (WELLBUTRIN XL) 300 MG 24 hr tablet, Take 300 mg by mouth every morning. , Disp: , Rfl:    collagenase (SANTYL) ointment, Apply 1 application topically daily., Disp: 90 g, Rfl: 0   fenofibrate 160 MG tablet, Take 160 mg by mouth every evening., Disp: , Rfl:    fish oil-omega-3 fatty acids 1000 MG capsule, Take 1 g by mouth daily. , Disp: , Rfl:    HUMALOG MIX 50/50 KWIKPEN (50-50) 100 UNIT/ML Kwikpen, Inject 40 Units into the skin 3 (three) times daily before meals., Disp: , Rfl:    metoprolol tartrate (LOPRESSOR) 100 MG tablet, Take 100 mg by mouth 2 (two) times daily., Disp: , Rfl:    naloxone (NARCAN) nasal spray 4 mg/0.1 mL, Place 1 spray into the nose daily., Disp: , Rfl:    NOVOFINE 32G X 6 MM MISC, , Disp: , Rfl:    olmesartan-hydrochlorothiazide (BENICAR HCT) 40-25 MG per tablet, Take 1 tablet by mouth every morning. , Disp: ,  Rfl:  omeprazole (PRILOSEC) 20 MG capsule, Take 20 mg by mouth every evening. , Disp: , Rfl:    ondansetron (ZOFRAN) 4 MG tablet, Take 1 tablet (4 mg total) by mouth every 8 (eight) hours as needed for nausea or vomiting., Disp: 40 tablet, Rfl: 0   oxyCODONE-acetaminophen (PERCOCET) 5-325 MG tablet, Take 1-2 tablets by mouth every 6 (six) hours as needed for severe pain., Disp: 40 tablet, Rfl: 0   polyethylene glycol (MIRALAX / GLYCOLAX) 17 g packet, Take 17 g by mouth daily as needed for moderate constipation., Disp: 14 each, Rfl: 0   potassium chloride SA (K-DUR,KLOR-CON) 20 MEQ tablet, Take 40 mEq by mouth every morning., Disp: , Rfl:    Review of Systems  Constitutional:  Negative for activity change, appetite change, chills, diaphoresis, fatigue, fever and unexpected weight change.  HENT:  Negative for congestion, rhinorrhea, sinus pressure, sneezing, sore throat and trouble swallowing.   Eyes:  Negative for photophobia and visual disturbance.  Respiratory:  Negative for cough, chest tightness, shortness of breath, wheezing and stridor.   Cardiovascular:  Negative for chest pain, palpitations and leg swelling.  Gastrointestinal:  Negative for abdominal distention, abdominal pain, anal bleeding, blood in stool, constipation, diarrhea, nausea and vomiting.  Genitourinary:  Negative for difficulty urinating, dysuria, flank pain and hematuria.  Musculoskeletal:  Negative for arthralgias, back pain, gait problem, joint swelling and myalgias.  Skin:  Positive for wound. Negative for color change, pallor and rash.  Neurological:  Negative for dizziness, tremors, weakness and light-headedness.  Hematological:  Negative for adenopathy. Does not bruise/bleed easily.  Psychiatric/Behavioral:  Negative for agitation, behavioral problems, confusion, decreased concentration, dysphoric mood and sleep disturbance.       Objective:   Physical Exam Constitutional:      Appearance: He is  well-developed.  HENT:     Head: Normocephalic and atraumatic.  Eyes:     Conjunctiva/sclera: Conjunctivae normal.  Cardiovascular:     Rate and Rhythm: Normal rate and regular rhythm.  Pulmonary:     Effort: Pulmonary effort is normal. No respiratory distress.     Breath sounds: No wheezing.  Abdominal:     General: There is no distension.     Palpations: Abdomen is soft.  Musculoskeletal:     Cervical back: Normal range of motion and neck supple.  Skin:    General: Skin is warm and dry.     Coloration: Skin is not pale.     Findings: No erythema or rash.  Neurological:     General: No focal deficit present.     Mental Status: He is alert and oriented to person, place, and time.  Psychiatric:        Mood and Affect: Mood normal.        Behavior: Behavior normal.        Thought Content: Thought content normal.        Judgment: Judgment normal.    \PICC line today December 14, 2021:    Left foot today December 14, 2021 right he has a healing abrasion:                   Assessment & Plan:  Septic arthritis of native left knee status post surgery:  Suspect this site was seeded with the Streptococcus intermedius that seeded his bloodstream from his osteomyelitis  He has completed sufficient antibiotics at this point in time.  Inflammatory markers have come down from 80-62 as far as his sed rate CRP is is up  a bit but clinically he seems better  Polymicrobial osteomyelitis of the right foot  He is status post amputation first toe and partial amputation second toe with Streptococcus intermedius group B streptococcus isolated cultures in OR along with beta-lactamase producing Prevotella species and Streptococcus intermedius having been found in blood cultures:  He has completed IV antibiotics with ceftriaxone and oral metronidazole through October 20 and been on Augmentin [id number] prior to being placed back on ceftriaxone for septic left knee  . I would like to  monitor him off antibiotics for the sake of his knee and also his foot where he has had osteomyelitis to monitor for any evidence of recurrence off antibiotics  Question of osteomyelitis in the left knee on MRI: The other possibility is it is due to erosive changes of osteoarthritis or arthritis.  Patient had been contemplating knee replacement surgery but certainly this needs to be pushed far off into the future.  We need to be reassured that the joint is no longer infected I agree with Dr. Erlinda Hong that it would be prudent to assess him off antibiotics and may be wise also as he mentions to aspirate the joint and sent for cell count with differential and culture.  Additionally given the concerns about the myelitis on the MRI it may be prudent to repeat the MRI several months down the road as well provided he is clinically stable certainly if he has worsening pain off antibiotics he will need to be reassessed  Hypertension continue metoprolol There were no vitals filed for this visit.  Diabetes mellitus he is on insulin followed by primary care  I spent  43 minutes with the patient including than 50% of the time in face to face counseling of the patient and his wife regarding the nature of septic arthritis its management through surgery and antibiotics the ways we monitor recurrence the management of osteomyelitis in his foot possibility of osteomyelitis in his left tibial plateau personally  along with review of medical records in preparation for the visit and during the visit and in coordination of his  care.

## 2021-12-14 NOTE — Therapy (Signed)
[facility] Physical Therapy [address] [place], [place], [address] Phone: [phone]   Fax:  [phone]  Physical Therapy Treatment  Patient Details  Name: [name] [name] MRN: [id number] Date of Birth: Nov 28, 1955 Referring Provider (PT): Aundra Dubin, [place]   Encounter Date: 12/14/2021   PT End of Session - 12/14/21 0911     Visit Number 4    Number of Visits 12    Date for PT Re-Evaluation 01/19/22    Authorization Type UHC, 23 visit limit    Authorization - Number of Visits 23    PT Start Time 0914    PT Stop Time 0955    PT Time Calculation (min) 41 min    Activity Tolerance Patient tolerated treatment well    Behavior During Therapy [facility] for tasks assessed/performed             Past Medical History:  Diagnosis Date   Anxiety    Baker's cyst 10/06/2021   Bulbous urethral stricture    chronic--- hx multiple dilatation's and urolume stent's   CAD (coronary artery disease) 04/ 2011  (previous cardiologist-  dr h. Tamala Julian , lov 06/ 2016)  currently followed by pcp   03-03-2010  CABG x 5, LIMA to LAD, left radial artery to OM1,SVG to R1, seqSVG to PDA and PLA   Cyanosis 10/06/2021   Depression    ED (erectile dysfunction)    Essential hypertension, benign    GERD (gastroesophageal reflux disease)    History of pancreatitis 2005   Knee effusion 10/06/2021   Mixed hyperlipidemia    OSA on CPAP    not used recently   S/P CABG x 5 03/03/2010   Septic arthritis of knee, left ([place]) 11/11/2021   Type II or unspecified type diabetes mellitus without mention of complication, not stated as uncontrolled    endocrinologist-  dr Buddy Duty    Past Surgical History:  Procedure Laterality Date   AMPUTATION Right 08/09/2021   Procedure: AMPUTATION DIGIT;  Surgeon: Trula Slade, DPM;  [facility];  Service: Podiatry;  Laterality: Right;   CARDIAC CATHETERIZATION  03-01-2010   dr Daneen Schick   nuclear stress test --positive moderate ishemia:   severe multivessel CAD w/ total occlusion of pLAD, HG obstrucion of pPDA with supplies collaterals around the apex to the LAD, severe stenosis distal RCA, total occlusion OM1, 75% D1, normal LVF    CORONARY ARTERY BYPASS GRAFT  03-03-2010  dr hendrickson    LIMA to LAD, SVG to RI, left radial artery to OM1, seqSVG  to PDA and PLA   CYST EXCISION Left 11/02/2021   Procedure: REMOVAL OF LEFT CALF MASS;  Surgeon: Leandrew Koyanagi, MD;  Location: [place];  Service: Orthopedics;  Laterality: Left;   CYSTO W/ DILATATION , LASER INCISION OF URETHRAL STRICTURE  02-25-2005   dr Karsten Ro  [facility]   CYSTO/  DILATION URETHRAL MEATAL STENOSIS AND DILATION BULBOUS STRICTURE  12-21-2004   dr Terance Hart  [facility]   CYSTO/ RETROGRADE PYELOGRAM/ DILATATION URETHRAL STRICTURE AND PLACEMENT UROLUME STENT  08-04-2011    dr Karsten Ro  [facility]   CYSTOSCOPY WITH DIRECT VISION INTERNAL URETHROTOMY  07/15/2008   dr Karsten Ro  [facility]   and UroLume Stent placement   CYSTOSCOPY WITH URETHRAL DILATATION N/A 04/19/2018   Procedure: CYSTOSCOPY WITH BALLOON URETHRAL DILATATION;  Surgeon: Kathie Rhodes, MD;  Location: [facility];  Service: Urology;  Laterality: N/A;   INCISION AND DRAINAGE Right 08/13/2021   Procedure: INCISION  AND DRAINAGE RIGHT FOOT WOUND;  Surgeon: Trula Slade, DPM;  [facility];  Service: Podiatry;  Laterality: Right;   KNEE ARTHROSCOPY Left 11/15/2021   Procedure: LEFT KNEE ARTHROSCOPIC IRRIGATION AND DEBRIDEMENT;  Surgeon: Leandrew Koyanagi, MD;  Location: [place];  Service: Orthopedics;  Laterality: Left;   ORIF TOE FRACTURE Left 05/22/2013   Procedure: OPEN REDUCTION INTERNAL FIXATION (ORIF) LEFT SECOND TOE PROXIMAL PHALANX FRACTURE/LEFT PERCUTANEOUS PINNING;  Surgeon: Wylene Simmer, MD;  Location: [place];  Service: Orthopedics;  Laterality: Left;   TONSILLECTOMY  child    There were no vitals filed for this visit.   Subjective Assessment - 12/14/21 0920     Subjective Pt. indicated having  procedure on Rt foot that has kept him off of his feet mostly.  Pt. indicated 4/10 pain in Lt knee upon arrival today.  Pt. stated trying to do HEP but leg lifts are tough.  Pt. arrived in wheel chair.    Pertinent History anxiety, CAD s/p CABG x 5, DM, depression, OA in Lt knee    Limitations Standing;Walking    Patient Stated Goals walk even if it's with a walker, improve strength    Currently in Pain? Yes    Pain Location Knee    Pain Orientation Left    Pain Descriptors / Indicators Aching;Tightness;Sore    Pain Type Surgical pain;Acute pain    Pain Onset 1 to 4 weeks ago    Pain Frequency Constant    Aggravating Factors  WB activity    Pain Relieving Factors rest, medicine                [facility] PT Assessment - 12/14/21 0001       Assessment   Medical Diagnosis M00.9 (ICD-10-CM) - Pyogenic arthritis of left knee joint, due to unspecified organism [facility])    Referring Provider (PT) Aundra Dubin, PA-C    Onset Date/Surgical Date 11/02/21    Hand Dominance Right      AROM   Left Knee Extension -18   in seated LAQ   Left Knee Flexion 111   in supine heel slide     PROM   Left Knee Extension -3   in supine heel prop with overpressure     Ambulation/Gait   Gait Comments Wheelchair mobilization today 2nd to Rt foot procedure in last week                           [facility] Adult PT Treatment/Exercise - 12/14/21 0001       Knee/Hip Exercises: Aerobic   Nustep UE/LE lvl 6 10 mins      Knee/Hip Exercises: Seated   Long Arc Quad Left;2 sets;10 reps   pause in extension and flexion end range   Long Arc Quad Weight 2 lbs.    Other Seated Knee/Hip Exercises seated SLR bilateral 2 x 10      Knee/Hip Exercises: Supine   Quad Sets 5 reps;Other (comment);Left   5 sec hold   Heel Slides Left;2 sets;10 reps;AROM   2-3 second hold in flexion   Other Supine Knee/Hip Exercises supine heel prop 4 mins c cues for home use                     PT Education  - 12/14/21 0951     Education Details HEP progression to include SLR, heel prop    Person(s) Educated Patient;Spouse    Methods  Explanation;Demonstration;Verbal cues;Handout    Comprehension Returned demonstration;Verbalized understanding              PT Short Term Goals - 12/14/21 0923       PT SHORT TERM GOAL #1   Title Independent with initial HEP    Time 4    Period Weeks    Status On-going    Target Date 12/22/21      PT SHORT TERM GOAL #2   Title Lt knee AROM improved 0-10-100 for improved function    Time 4    Period Weeks    Status On-going    Target Date 12/22/21      PT SHORT TERM GOAL #3   Title balance assessment with LTGs to follow    Time 4    Period Weeks    Status Achieved    Target Date 12/22/21               PT Long Term Goals - 12/14/21 0924       PT LONG TERM GOAL #1   Title Independent with final HEP    Time 8    Period Weeks    Status On-going    Target Date 01/19/22      PT LONG TERM GOAL #2   Title Lt knee AROM improved 0-110 for improved function    Time 8    Period Weeks    Status On-going    Target Date 01/19/22      PT LONG TERM GOAL #3   Title Amb mod I with RW at least 250' for improved function    Time 8    Period Weeks    Status On-going    Target Date 01/19/22      PT LONG TERM GOAL #4   Title Report pain < 2/10 with activity for improved function    Time 8    Period Weeks    Status On-going    Target Date 01/19/22      PT LONG TERM GOAL #5   Title FOTO score improved to 50 for improved function    Time 8    Period Weeks    Status On-going    Target Date 01/19/22      Additional Long Term Goals   Additional Long Term Goals Yes      PT LONG TERM GOAL #6   Title Pt. will demonstrate BERG > 45 to indicate reduced fall risk.    Time 10    Period Weeks    Status New    Target Date 02/22/22                   Plan - 12/14/21 0943     Clinical Impression Statement Pt. continued to present  c gross Lt leg weakness, noted in knee extension as well as active terminal knee extension control which may continue to benefit from skilled PT services for continued improvements.  WB activity limited secondary to recent Rt foot procedure.    Personal Factors and Comorbidities Time since onset of injury/illness/exacerbation;Past/Current Experience;Comorbidity 3+    Comorbidities anxiety, CAD s/p CABG x 5, DM, depression    Examination-Activity Limitations Bathing;Locomotion Level;Transfers;Bed Mobility;Stairs;Stand;Lift;Carry    Examination-Participation Restrictions Meal Prep;Community Activity    Stability/Clinical Decision Making Unstable/Unpredictable    Rehab Potential Good    PT Frequency Other (comment)   2x/wk x 4 wks then 1x/wk x 4 wks   PT Duration 8 weeks    PT Treatment/Interventions  ADLs/Self Care Home Management;Cryotherapy;Electrical Stimulation;DME Instruction;Moist Heat;Gait training;Stair training;Functional mobility training;Therapeutic activities;Therapeutic exercise;Balance training;Patient/family education;Neuromuscular re-education;Manual techniques;Taping;Passive range of motion;Vasopneumatic Device    PT Next Visit Plan Terminal knee extension work, quad strength progression.    PT Home Exercise Plan Access Code: [email or web address]    Consulted and Agree with Plan of Care Patient;Family member/caregiver    Family Member Consulted wife             Patient will benefit from skilled therapeutic intervention in order to improve the following deficits and impairments:  Abnormal gait, Pain, Decreased mobility, Decreased range of motion, Decreased endurance, Decreased activity tolerance, Decreased balance, Decreased strength, Impaired flexibility, Increased edema, Difficulty walking  Visit Diagnosis: Acute pain of left knee  Stiffness of left knee, not elsewhere classified  Unsteadiness on feet  Difficulty in walking, not elsewhere classified  Muscle weakness  (generalized)     Problem List Patient Active Problem List   Diagnosis Date Noted   Septic arthritis of knee, left ([place]) 11/11/2021   Mass of lower leg, left 11/02/2021   Amputation of right great toe ([place]) 10/31/2021   Anxiety 10/31/2021   Chronic kidney disease, stage 3b ([place]) 10/31/2021   Diabetic renal disease ([place]) 10/31/2021   Gastroesophageal reflux disease without esophagitis 10/31/2021   History of amputation of great toe ([place]) 10/31/2021   Hyperglycemia due to type 2 diabetes mellitus ([place]) 10/31/2021   Leukocytosis 10/31/2021   Long term (current) use of insulin ([place]) 10/31/2021   Malignant hypertensive chronic kidney disease 10/31/2021   Male hypogonadism 10/31/2021   Mass of left adrenal gland ([place]) 10/31/2021   Obstructive sleep apnea syndrome 10/31/2021   Other specified disorders of bladder 10/31/2021   Polyneuropathy due to type 2 diabetes mellitus ([place]) 10/31/2021   Baker's cyst, left 10/06/2021   Knee effusion, left 10/06/2021   Cyanosis 10/06/2021   Hyponatremia    AKI (acute kidney injury) ([place])    Elevated LFTs    Post-operative state    Cellulitis of left lower extremity    Streptococcal bacteremia    Cellulitis of right lower extremity    Osteomyelitis of great toe of right foot ([place])    Osteomyelitis of second toe of right foot ([place])    Diabetes mellitus due to underlying condition with diabetic autonomic neuropathy, without long-term current use of insulin (HCC)    Class 3 severe obesity with serious comorbidity and body mass index (BMI) of 45.0 to 49.9 in adult [facility])    Toe infection 08/08/2021   CAD (coronary artery disease) of artery bypass graft 04/21/2015   Hyperlipidemia 04/21/2015   Essential hypertension 04/21/2015   Type 2 diabetes mellitus ([place]) 04/21/2015   Ulcer of foot ([place]) 08/22/2014    Scot Jun, PT, DPT, OCS, ATC 12/14/21  9:53 AM    [facility] Physical Therapy [address] [place], [place],  [address] Phone: [phone]   Fax:  [phone]  Name: [name] [name] MRN: [id number] Date of Birth: 21-Mar-1956

## 2021-12-14 NOTE — Progress Notes (Signed)
Post-Op Visit Note   Patient: [name] [name]           Date of Birth: 05-08-1956           MRN: [id number] Visit Date: 12/14/2021 PCP: [name] JoeSwayne, David, MD   Assessment & Plan:  Chief Complaint:  Chief Complaint  Patient presents with   Left Knee - Routine Post Op   Visit Diagnoses:  1. Pyogenic arthritis of left knee joint, due to unspecified organism (HCC)   2. Primary osteoarthritis of left knee     Plan: [name] is following up status post arthroscopic I&D of the left knee joint on 11/15/2021 for septic arthritis.  He is feeling better overall.  He he continues to take IV Rocephin through the PICC line.  Overall the range of motion is better.  He sees infectious disease today.  Examination of the left knee and the lower leg shows full healed surgical scars.  He does have residual joint effusion.  Range of motion of the knee from 0 to 90 degrees is well-tolerated.  At this time we will await recommendations from the infectious disease clinic.  We again had a discussion on when it would be appropriate to undergo a total knee replacement.  We would have to ensure that his infection has been eradicated.  He would need an antibiotic holiday of couple months to confirm this.  We would likely reaspirated the knee joint as another way of reassuring that there is no residual infection.  He understands the potential grave consequences of a prosthetic joint infection.  Follow-Up Instructions: No follow-ups on file.   Orders:  No orders of the defined types were placed in this encounter.  No orders of the defined types were placed in this encounter.   Imaging: No results found.  PMFS History: Patient Active Problem List   Diagnosis Date Noted   Septic arthritis of knee, left (HCC) 11/11/2021   Mass of lower leg, left 11/02/2021   Amputation of right great toe (HCC) 10/31/2021   Anxiety 10/31/2021   Chronic kidney disease, stage 3b (HCC) 10/31/2021   Diabetic renal disease (HCC)  10/31/2021   Gastroesophageal reflux disease without esophagitis 10/31/2021   History of amputation of great toe (HCC) 10/31/2021   Hyperglycemia due to type 2 diabetes mellitus (HCC) 10/31/2021   Leukocytosis 10/31/2021   Long term (current) use of insulin (HCC) 10/31/2021   Malignant hypertensive chronic kidney disease 10/31/2021   Male hypogonadism 10/31/2021   Mass of left adrenal gland (HCC) 10/31/2021   Obstructive sleep apnea syndrome 10/31/2021   Other specified disorders of bladder 10/31/2021   Polyneuropathy due to type 2 diabetes mellitus (HCC) 10/31/2021   Baker's cyst, left 10/06/2021   Knee effusion, left 10/06/2021   Cyanosis 10/06/2021   Hyponatremia    AKI (acute kidney injury) (HCC)    Elevated LFTs    Post-operative state    Cellulitis of left lower extremity    Streptococcal bacteremia    Cellulitis of right lower extremity    Osteomyelitis of great toe of right foot (HCC)    Osteomyelitis of second toe of right foot (HCC)    Diabetes mellitus due to underlying condition with diabetic autonomic neuropathy, without long-term current use of insulin (HCC)    Class 3 severe obesity with serious comorbidity and body mass index (BMI) of 45.0 to 49.9 in adult [facility](HCC)    Toe infection 08/08/2021   CAD (coronary artery disease) of artery bypass graft 04/21/2015  Hyperlipidemia 04/21/2015   Essential hypertension 04/21/2015   Type 2 diabetes mellitus ([place]) 04/21/2015   Ulcer of foot ([place]) 08/22/2014   Past Medical History:  Diagnosis Date   Anxiety    Baker's cyst 10/06/2021   Bulbous urethral stricture    chronic--- hx multiple dilatation's and urolume stent's   CAD (coronary artery disease) 04/ 2011  (previous cardiologist-  dr h. Tamala Julian , lov 06/ 2016)  currently followed by pcp   03-03-2010  CABG x 5, LIMA to LAD, left radial artery to OM1,SVG to R1, seqSVG to PDA and PLA   Cyanosis 10/06/2021   Depression    ED (erectile dysfunction)    Essential hypertension,  benign    GERD (gastroesophageal reflux disease)    History of pancreatitis 2005   Knee effusion 10/06/2021   Mixed hyperlipidemia    OSA on CPAP    not used recently   S/P CABG x 5 03/03/2010   Septic arthritis of knee, left ([place]) 11/11/2021   Type II or unspecified type diabetes mellitus without mention of complication, not stated as uncontrolled    endocrinologist-  dr Buddy Duty    Family History  Problem Relation Age of Onset   Cancer Mother        lung   Hypertension Father    CAD Father    CVA Brother     Past Surgical History:  Procedure Laterality Date   AMPUTATION Right 08/09/2021   Procedure: AMPUTATION DIGIT;  Surgeon: Trula Slade, DPM;  [facility];  Service: Podiatry;  Laterality: Right;   CARDIAC CATHETERIZATION  03-01-2010   dr Daneen Schick   nuclear stress test --positive moderate ishemia:  severe multivessel CAD w/ total occlusion of pLAD, HG obstrucion of pPDA with supplies collaterals around the apex to the LAD, severe stenosis distal RCA, total occlusion OM1, 75% D1, normal LVF    CORONARY ARTERY BYPASS GRAFT  03-03-2010  dr hendrickson    LIMA to LAD, SVG to RI, left radial artery to OM1, seqSVG  to PDA and PLA   CYST EXCISION Left 11/02/2021   Procedure: REMOVAL OF LEFT CALF MASS;  Surgeon: Leandrew Koyanagi, MD;  Location: [place];  Service: Orthopedics;  Laterality: Left;   CYSTO W/ DILATATION , LASER INCISION OF URETHRAL STRICTURE  02-25-2005   dr Karsten Ro  [facility]   CYSTO/  DILATION URETHRAL MEATAL STENOSIS AND DILATION BULBOUS STRICTURE  12-21-2004   dr Terance Hart  [facility]   CYSTO/ RETROGRADE PYELOGRAM/ DILATATION URETHRAL STRICTURE AND PLACEMENT UROLUME STENT  08-04-2011    dr Karsten Ro  [facility]   CYSTOSCOPY WITH DIRECT VISION INTERNAL URETHROTOMY  07/15/2008   dr Karsten Ro  [facility]   and UroLume Stent placement   CYSTOSCOPY WITH URETHRAL DILATATION N/A 04/19/2018   Procedure: CYSTOSCOPY WITH BALLOON URETHRAL DILATATION;  Surgeon: Kathie Rhodes, MD;   Location: [facility];  Service: Urology;  Laterality: N/A;   INCISION AND DRAINAGE Right 08/13/2021   Procedure: INCISION AND DRAINAGE RIGHT FOOT WOUND;  Surgeon: Trula Slade, DPM;  [facility];  Service: Podiatry;  Laterality: Right;   KNEE ARTHROSCOPY Left 11/15/2021   Procedure: LEFT KNEE ARTHROSCOPIC IRRIGATION AND DEBRIDEMENT;  Surgeon: Leandrew Koyanagi, MD;  Location: [place];  Service: Orthopedics;  Laterality: Left;   ORIF TOE FRACTURE Left 05/22/2013   Procedure: OPEN REDUCTION INTERNAL FIXATION (ORIF) LEFT SECOND TOE PROXIMAL PHALANX FRACTURE/LEFT PERCUTANEOUS PINNING;  Surgeon: Wylene Simmer, MD;  Location: [place];  Service: Orthopedics;  Laterality: Left;   TONSILLECTOMY  child   Social History   Occupational History   Not on file  Tobacco Use   Smoking status: Never   Smokeless tobacco: Never  Vaping Use   Vaping Use: Never used  Substance and Sexual Activity   Alcohol use: Yes    Comment: occasional   Drug use: No   Sexual activity: Not Currently

## 2021-12-14 NOTE — Telephone Encounter (Signed)
I spoke to [place] with [facility] and verbal orders given to remove picc after patient's last dose of IV antibiotics on 12/14/21 per Dr. Daiva Eves. Larita Fife verbalized understanding and stated a nurse will go out tomorrow to remove picc.  [name] [name] T Pricilla Loveless

## 2021-12-14 NOTE — Patient Instructions (Signed)
Access Code: [email or web address] URL: [email or web address] Date: 12/14/2021 Prepared by: Chyrel Masson  Exercises Quad Setting and Stretching - 5-10 x daily - 7 x weekly - 1 sets - 5-10 reps - 5 sec hold Supine Heel Slide with Strap - 3-5 x daily - 7 x weekly - 1 sets - 10 reps Seated Knee Extension AROM - 3-5 x daily - 7 x weekly - 1 sets - 10 reps - 5 sec hold Seated Knee Flexion AAROM - 3-5 x daily - 7 x weekly - 1 sets - 10 reps - 10 sec hold Seated Straight Leg Heel Taps - 1-2 x daily - 7 x weekly - 3 sets - 10 reps Supine Knee Extension Mobilization with Weight - 1 x daily - 7 x weekly - 1 sets - 4 reps - to tolerance up to 15 mins hold

## 2021-12-15 NOTE — Telephone Encounter (Signed)
Thank you :)

## 2021-12-19 ENCOUNTER — Encounter: Payer: 59 | Admitting: Physical Therapy

## 2021-12-19 NOTE — Progress Notes (Signed)
Subjective: [name] [name] is a 66 y.o. is seen today in office s/p right foot exostectomy, partial first metatarsal resection preformed on 12/07/2021.  He has been doing well with no significant pain.  He been walking the foot for short distances but using a offloading shoe.  Currently denies any fevers or chills.  No chest pain or shortness of breath.    Objective: General: No acute distress, AAOx3  DP/PT pulses palpable 2/4, CRT < 3 sec to all digits.  Right foot: Incision is well coapted without any evidence of dehiscence with sutures intact.  Minimal rim of erythema but there is no ascending cellulitis.  No drainage or pus.  The erythema is likely more from inflammation postoperatively postinfection.  There is no drainage or pus or any increase in warmth. No pain with calf compression, swelling, warmth, erythema.   Assessment and Plan:  Status post right foot surgery, doing well with no complications   -Treatment options discussed including all alternatives, risks, and complications -X-rays obtained reviewed.  Status post resection first metatarsal.  No evidence of acute fracture. -Incision was clean.  Small amount of antibiotic ointment and a bandage was applied.  Discussed with him and his wife that they can change the bandage with a similar bandage. -Weight-bear as tolerated in surgical shoe -Ice/elevation -Pain medication as needed. -Monitor for any clinical signs or symptoms of infection and DVT/PE and directed to call the office immediately should any occur or go to the ER. -Follow-up as scheduled for possible suture removal or sooner if any problems arise. In the meantime, encouraged to call the office with any questions, concerns, change in symptoms.   Ovid Curd, DPM

## 2021-12-20 LAB — ACID FAST CULTURE WITH REFLEXED SENSITIVITIES (MYCOBACTERIA)
Acid Fast Culture: NEGATIVE
Acid Fast Culture: NEGATIVE

## 2021-12-21 ENCOUNTER — Encounter: Payer: Self-pay | Admitting: Physical Therapy

## 2021-12-21 ENCOUNTER — Ambulatory Visit: Payer: 59 | Admitting: Physical Therapy

## 2021-12-21 ENCOUNTER — Other Ambulatory Visit: Payer: Self-pay

## 2021-12-21 DIAGNOSIS — R2681 Unsteadiness on feet: Secondary | ICD-10-CM

## 2021-12-21 DIAGNOSIS — M25662 Stiffness of left knee, not elsewhere classified: Secondary | ICD-10-CM

## 2021-12-21 DIAGNOSIS — R262 Difficulty in walking, not elsewhere classified: Secondary | ICD-10-CM | POA: Diagnosis not present

## 2021-12-21 DIAGNOSIS — M25562 Pain in left knee: Secondary | ICD-10-CM

## 2021-12-21 DIAGNOSIS — M6281 Muscle weakness (generalized): Secondary | ICD-10-CM

## 2021-12-21 NOTE — Therapy (Signed)
OUTPATIENT PHYSICAL THERAPY TREATMENT NOTE   Patient Name: [name] [name] MRN: [id number] DOB:1956/08/12, 66 y.o., male Today's Date: 12/21/2021  PCP: Antony Contras, MD REFERRING PROVIDER: Aundra Dubin, PA-C   PT End of Session - 12/21/21 1258     Visit Number 5    Number of Visits 12    Date for PT Re-Evaluation 01/19/22    Authorization Type [employer], 23 visit limit    Authorization - Number of Visits 23    PT Start Time 1255    PT Stop Time 1335    PT Time Calculation (min) 40 min    Equipment Utilized During Treatment Gait belt    Activity Tolerance Patient tolerated treatment well    Behavior During Therapy WFL for tasks assessed/performed             Past Medical History:  Diagnosis Date   Anxiety    Baker's cyst 10/06/2021   Bulbous urethral stricture    chronic--- hx multiple dilatation's and urolume stent's   CAD (coronary artery disease) 04/ 2011  (previous cardiologist-  dr h. Tamala Julian , lov 06/ 2016)  currently followed by pcp   03-03-2010  CABG x 5, LIMA to LAD, left radial artery to OM1,SVG to R1, seqSVG to PDA and PLA   Cyanosis 10/06/2021   Depression    ED (erectile dysfunction)    Essential hypertension, benign    GERD (gastroesophageal reflux disease)    History of pancreatitis 2005   Knee effusion 10/06/2021   Mixed hyperlipidemia    OSA on CPAP    not used recently   S/P CABG x 5 03/03/2010   Septic arthritis of knee, left ([place]) 11/11/2021   Type II or unspecified type diabetes mellitus without mention of complication, not stated as uncontrolled    endocrinologist-  dr Buddy Duty   Past Surgical History:  Procedure Laterality Date   AMPUTATION Right 08/09/2021   Procedure: AMPUTATION DIGIT;  Surgeon: Trula Slade, DPM;  Location: [facility];  Service: Podiatry;  Laterality: Right;   CARDIAC CATHETERIZATION  03-01-2010   dr Daneen Schick   nuclear stress test --positive moderate ishemia:  severe multivessel CAD w/ total occlusion of pLAD, HG  obstrucion of pPDA with supplies collaterals around the apex to the LAD, severe stenosis distal RCA, total occlusion OM1, 75% D1, normal LVF    CORONARY ARTERY BYPASS GRAFT  03-03-2010  dr hendrickson    LIMA to LAD, SVG to RI, left radial artery to OM1, seqSVG  to PDA and PLA   CYST EXCISION Left 11/02/2021   Procedure: REMOVAL OF LEFT CALF MASS;  Surgeon: Leandrew Koyanagi, MD;  Location: [place];  Service: Orthopedics;  Laterality: Left;   CYSTO W/ DILATATION , LASER INCISION OF URETHRAL STRICTURE  02-25-2005   dr Karsten Ro  [facility]   CYSTO/  DILATION URETHRAL MEATAL STENOSIS AND DILATION BULBOUS STRICTURE  12-21-2004   dr Terance Hart  [facility]   CYSTO/ RETROGRADE PYELOGRAM/ DILATATION URETHRAL STRICTURE AND PLACEMENT UROLUME STENT  08-04-2011    dr Karsten Ro  [facility]   CYSTOSCOPY WITH DIRECT VISION INTERNAL URETHROTOMY  07/15/2008   dr Karsten Ro  [facility]   and UroLume Stent placement   CYSTOSCOPY WITH URETHRAL DILATATION N/A 04/19/2018   Procedure: CYSTOSCOPY WITH BALLOON URETHRAL DILATATION;  Surgeon: Kathie Rhodes, MD;  Location: [facility];  Service: Urology;  Laterality: N/A;   INCISION AND DRAINAGE Right 08/13/2021   Procedure: INCISION AND DRAINAGE RIGHT FOOT WOUND;  Surgeon: Jacqualyn Posey,  Bonna Gains, DPM;  Location: [facility];  Service: Podiatry;  Laterality: Right;   KNEE ARTHROSCOPY Left 11/15/2021   Procedure: LEFT KNEE ARTHROSCOPIC IRRIGATION AND DEBRIDEMENT;  Surgeon: Leandrew Koyanagi, MD;  Location: [place];  Service: Orthopedics;  Laterality: Left;   ORIF TOE FRACTURE Left 05/22/2013   Procedure: OPEN REDUCTION INTERNAL FIXATION (ORIF) LEFT SECOND TOE PROXIMAL PHALANX FRACTURE/LEFT PERCUTANEOUS PINNING;  Surgeon: Wylene Simmer, MD;  Location: [place];  Service: Orthopedics;  Laterality: Left;   TONSILLECTOMY  child   Patient Active Problem List   Diagnosis Date Noted   Septic arthritis of knee, left ([place]) 11/11/2021   Mass of lower leg, left 11/02/2021   Amputation of right great toe  ([place]) 10/31/2021   Anxiety 10/31/2021   Chronic kidney disease, stage 3b ([place]) 10/31/2021   Diabetic renal disease ([place]) 10/31/2021   Gastroesophageal reflux disease without esophagitis 10/31/2021   History of amputation of great toe ([place]) 10/31/2021   Hyperglycemia due to type 2 diabetes mellitus ([place]) 10/31/2021   Leukocytosis 10/31/2021   Long term (current) use of insulin ([place]) 10/31/2021   Malignant hypertensive chronic kidney disease 10/31/2021   Male hypogonadism 10/31/2021   Mass of left adrenal gland ([place]) 10/31/2021   Obstructive sleep apnea syndrome 10/31/2021   Other specified disorders of bladder 10/31/2021   Polyneuropathy due to type 2 diabetes mellitus ([place]) 10/31/2021   Baker's cyst, left 10/06/2021   Knee effusion, left 10/06/2021   Cyanosis 10/06/2021   Hyponatremia    AKI (acute kidney injury) ([place])    Elevated LFTs    Post-operative state    Cellulitis of left lower extremity    Streptococcal bacteremia    Cellulitis of right lower extremity    Osteomyelitis of great toe of right foot (HCC)    Osteomyelitis of second toe of right foot ([place])    Diabetes mellitus due to underlying condition with diabetic autonomic neuropathy, without long-term current use of insulin (HCC)    Class 3 severe obesity with serious comorbidity and body mass index (BMI) of 45.0 to 49.9 in adult [facility])    Toe infection 08/08/2021   CAD (coronary artery disease) of artery bypass graft 04/21/2015   Hyperlipidemia 04/21/2015   Essential hypertension 04/21/2015   Type 2 diabetes mellitus ([place]) 04/21/2015   Ulcer of foot ([place]) 08/22/2014    REFERRING DIAG: M00.9 (ICD-10-CM) - Pyogenic arthritis of left knee joint, due to unspecified organism ([place])   THERAPY DIAG:  Acute pain of left knee  Stiffness of left knee, not elsewhere classified  Unsteadiness on feet  Difficulty in walking, not elsewhere classified  Muscle weakness (generalized)  PERTINENT HISTORY: anxiety, CAD s/p CABG  x 5, DM, depression   PRECAUTIONS: Fall  SUBJECTIVE: trying to walk with quad cane at home some; removed his PICC line last week  PAIN:  Are you having pain? Yes NPRS scale: 8/10 Pain location: knee Pain orientation: Left and Lateral  PAIN TYPE: sharp Pain description: intermittent  Aggravating factors: increased activity "I think I overdid it." Relieving factors: rest  OBJECTIVE:   11/24/21  (FOTO) 27 (predicted 50)    TODAY'S TREATMENT:  12/21/21 Therapeutic Exercise:  Aerobic: NuStep L6 x 10 min  Seated: LAQ 2x10 bil with 2#; sit to stand 1UE support x 10 reps  Standing: hip abdct, hip ext x 10 reps each bil with bil UE support Neuromuscular Re-education: Gait Training: Amb 100' with SBQC and SPC with quad tip with min A and mod cues for sequencing,  improved coordination with SPC rather than SBQC; discussed using RW at home and decreasing reliance on W/C.  Recommended using RW to come to PT appts moving fwd.     12/14/21 [facility] Adult PT Treatment/Exercise - 12/14/21 0001                Knee/Hip Exercises: Aerobic    Nustep UE/LE lvl 6 10 mins          Knee/Hip Exercises: Seated    Long Arc Quad Left;2 sets;10 reps   pause in extension and flexion end range    Long Arc Quad Weight 2 lbs.     Other Seated Knee/Hip Exercises seated SLR bilateral 2 x 10          Knee/Hip Exercises: Supine    Quad Sets 5 reps;Other (comment);Left   5 sec hold    Heel Slides Left;2 sets;10 reps;AROM   2-3 second hold in flexion    Other Supine Knee/Hip Exercises supine heel prop 4 mins c cues for home use      PATIENT EDUCATION: Education details: n/a    HOME EXERCISE PROGRAM: Access Code: [email or web address]   PT Short Term Goals - 12/14/21 0923       PT SHORT TERM GOAL #1   Title Independent with initial HEP    Time 4    Period Weeks    Status On-going    Target Date 12/22/21      PT SHORT TERM GOAL #2   Title Lt knee AROM improved 0-10-100 for improved function    Time 4     Period Weeks    Status On-going    Target Date 12/22/21      PT SHORT TERM GOAL #3   Title balance assessment with LTGs to follow    Time 4    Period Weeks    Status Achieved    Target Date 12/22/21              PT Long Term Goals - 12/14/21 0924       PT LONG TERM GOAL #1   Title Independent with final HEP    Time 8    Period Weeks    Status On-going    Target Date 01/19/22      PT LONG TERM GOAL #2   Title Lt knee AROM improved 0-110 for improved function    Time 8    Period Weeks    Status On-going    Target Date 01/19/22      PT LONG TERM GOAL #3   Title Amb mod I with RW at least 250' for improved function    Time 8    Period Weeks    Status On-going    Target Date 01/19/22      PT LONG TERM GOAL #4   Title Report pain < 2/10 with activity for improved function    Time 8    Period Weeks    Status On-going    Target Date 01/19/22      PT LONG TERM GOAL #5   Title FOTO score improved to 50 for improved function    Time 8    Period Weeks    Status On-going    Target Date 01/19/22      Additional Long Term Goals   Additional Long Term Goals Yes      PT LONG TERM GOAL #6   Title Pt. will demonstrate BERG > 45 to indicate reduced fall risk.  Time 10    Period Weeks    Status New    Target Date 02/22/22              Plan      Clinical Impression Statement Pt tolerated session well and still gets fatigued with short standing durations.  Trial of amb with canes today demonstrating better sequencing with SPC so anticipate training with this going forward.  Encouraged amb as much as possible with RW at home.  Will continue to benefit from PT to maximize function.    Personal Factors and Comorbidities Time since onset of injury/illness/exacerbation;Past/Current Experience;Comorbidity 3+     Comorbidities anxiety, CAD s/p CABG x 5, DM, depression     Examination-Activity Limitations Bathing;Locomotion Level;Transfers;Bed  Mobility;Stairs;Stand;Lift;Carry     Examination-Participation Restrictions Meal Prep;Community Activity     Stability/Clinical Decision Making Unstable/Unpredictable     Rehab Potential Good     PT Frequency Other (comment)   2x/wk x 4 wks then 1x/wk x 4 wks    PT Duration 8 weeks     PT Treatment/Interventions ADLs/Self Care Home Management;Cryotherapy;Electrical Stimulation;DME Instruction;Moist Heat;Gait training;Stair training;Functional mobility training;Therapeutic activities;Therapeutic exercise;Balance training;Patient/family education;Neuromuscular re-education;Manual techniques;Taping;Passive range of motion;Vasopneumatic Device     PT Next Visit Plan Check STGs, continue standing/activity tolerance, gait training      PT Home Exercise Plan Access Code: [email or web address]     Consulted and Agree with Plan of Care Patient;Family member/caregiver     Family Member Consulted wife                   Patient will benefit from skilled therapeutic intervention in order to improve the following deficits and impairments:  Abnormal gait, Pain, Decreased mobility, Decreased range of motion, Decreased endurance, Decreased activity tolerance, Decreased balance, Decreased strength, Impaired flexibility, Increased edema, Difficulty walking     Laureen Abrahams, PT, DPT 12/21/21 1:39 PM

## 2021-12-22 ENCOUNTER — Ambulatory Visit (INDEPENDENT_AMBULATORY_CARE_PROVIDER_SITE_OTHER): Payer: 59 | Admitting: Podiatry

## 2021-12-22 DIAGNOSIS — Z9889 Other specified postprocedural states: Secondary | ICD-10-CM

## 2021-12-22 DIAGNOSIS — M898X7 Other specified disorders of bone, ankle and foot: Secondary | ICD-10-CM

## 2021-12-24 NOTE — Progress Notes (Signed)
Subjective: [name] [name] is a 66 y.o. is seen today in office s/p right foot exostectomy, partial first metatarsal resection preformed on 12/07/2021.  Presents today for suture removal.  He has not had any significant pain he has been trying to do more walking regimen surgical shoe.  Denies any fevers or chills.  No chest pain or shortness of breath.  No other concerns.   Objective: General: No acute distress, AAOx3  DP/PT pulses palpable 2/4, CRT < 3 sec to all digits.  Right foot: Incision is well coapted without any evidence of dehiscence with sutures intact.  There is no surrounding erythema, ascending cellulitis.  There is no fluctuation or crepitation.  No malodor.  No obvious signs of infection. Left foot: Superficial wound present distal left hallux.  This appears to be localized to the skin.  There is no probing, and or tunneling.  There is no erythema, ascending cellulitis.  No fluctuation or crepitation but there is no malodor. No pain with calf compression, swelling, warmth, erythema.   Assessment and Plan:  Status post right foot surgery, doing well with no complications   -Treatment options discussed including all alternatives, risks, and complications -Sutures removed on the right foot to the any complications.  Steri-Strips applied for reinforcement followed by Betadine ointment.  Discussed dressing changes with the patient as well as his wife who accompanies him today.  Continue surgical shoe for now. -Continue Betadine on the wound on the left side. -Diabetic shoes recommended to help offload given his history amputation, ulcerations and help prevent further skin breakdown to facilitate gait. -Monitor for any clinical signs or symptoms of infection and directed to call the office immediately should any occur or go to the ER.  Vivi Barrack DPM

## 2021-12-28 ENCOUNTER — Other Ambulatory Visit: Payer: Self-pay

## 2021-12-28 ENCOUNTER — Ambulatory Visit: Payer: 59

## 2021-12-28 DIAGNOSIS — E1149 Type 2 diabetes mellitus with other diabetic neurological complication: Secondary | ICD-10-CM

## 2021-12-28 DIAGNOSIS — Z899 Acquired absence of limb, unspecified: Secondary | ICD-10-CM

## 2021-12-29 NOTE — Progress Notes (Signed)
SITUATION Reason for Consult: Evaluation for Prefabricated Diabetic Shoes and Bilateral Custom Diabetic Inserts. Patient / Caregiver Report: Patient would like well fitting shoes  OBJECTIVE DATA: Patient History / Diagnosis:    ICD-10-CM   1. Type II diabetes mellitus with neurological manifestations (HCC)  E11.49     2. Status post amputation  Z89.9       Current or Previous Devices:   None and no history  In-Person Foot Examination: Ulcers & Callousing:   Historical  Toe / Foot Deformities:   - Pes Planus - Hammertoes   Shoe Size: 13W  ORTHOTIC RECOMMENDATION Recommended Devices: - 1x pair prefabricated PDAC approved diabetic shoes: Orthofeet 672 - 3x Left custom-to-patient vacuum formed diabetic insoles.  - 1x right custom-to-patient vacuum formed partial foot prosthesis  GOALS OF SHOES AND INSOLES - Reduce shear and pressure - Reduce / Prevent callus formation - Reduce / Prevent ulceration - Protect the fragile healing compromised diabetic foot.  Patient would benefit from diabetic shoes and inserts as patient has diabetes mellitus and the patient has one or more of the following conditions: - History of partial or complete amputation of the foot - History of previous foot ulceration. - History of pre-ulcerative callus - Peripheral neuropathy with evidence of callus formation - Foot deformity - Poor circulation  ACTIONS PERFORMED Patient was casted for insoles via crush box and measured for shoes via brannock device. Procedure was explained and patient tolerated procedure well. All questions were answered and concerns addressed.  PLAN Patient is to ensure treating physician receives and completes diabetic paperwork. Casts and shoe order are to be held until paperwork is received. Once received patient is to be scheduled for fitting in four weeks.

## 2022-01-05 ENCOUNTER — Ambulatory Visit (INDEPENDENT_AMBULATORY_CARE_PROVIDER_SITE_OTHER): Payer: 59

## 2022-01-05 ENCOUNTER — Ambulatory Visit (INDEPENDENT_AMBULATORY_CARE_PROVIDER_SITE_OTHER): Payer: 59 | Admitting: Podiatry

## 2022-01-05 ENCOUNTER — Other Ambulatory Visit: Payer: Self-pay

## 2022-01-05 DIAGNOSIS — Z9889 Other specified postprocedural states: Secondary | ICD-10-CM

## 2022-01-05 DIAGNOSIS — M898X7 Other specified disorders of bone, ankle and foot: Secondary | ICD-10-CM

## 2022-01-05 DIAGNOSIS — E1149 Type 2 diabetes mellitus with other diabetic neurological complication: Secondary | ICD-10-CM

## 2022-01-08 NOTE — Progress Notes (Signed)
Subjective: [name] [name] is a 66 y.o. is seen today in office s/p right foot exostectomy, partial first metatarsal resection preformed on 12/07/2021.  He recently went back into regular shoe as not causing any issues.  No increase in swelling or redness.  No new open lesions are noted.  Denies any fevers or chills.  No other concerns.  Objective: General: No acute distress, AAOx3  DP/PT pulses palpable 2/4, CRT < 3 sec to all digits.  Right foot: Incision is well coapted without any evidence of dehiscence and scars are forming.  There is trace edema there is no erythema or warmth.  No areas of fluctuation or crepitation.  No pain. Left foot: Hyperkeratotic lesion present plantar hallux upon debridement underlying skin intact without any ulcerations. No pain with calf compression, swelling, warmth, erythema.        Assessment and Plan:  Status post right foot surgery, doing well with no complications   -Treatment options discussed including all alternatives, risks, and complications -X-rays today reviewed of the right foot.  No evidence of acute fracture.  Status post partial first amputation.  No evidence of osteomyelitis. -At this point incisions are healing well and the wound on the left side is healed.  Discussed continuing regular shoe gear but keeping a close monitoring of his feet.  Should there be any changes to me know immediately.   -Awaiting diabetic shoes. -Monitor for any clinical signs or symptoms of infection and directed to call the office immediately should any occur or go to the ER.  No follow-ups on file.  Vivi Barrack DPM

## 2022-01-13 ENCOUNTER — Other Ambulatory Visit: Payer: Self-pay | Admitting: Internal Medicine

## 2022-01-13 DIAGNOSIS — R0989 Other specified symptoms and signs involving the circulatory and respiratory systems: Secondary | ICD-10-CM

## 2022-01-13 DIAGNOSIS — I2510 Atherosclerotic heart disease of native coronary artery without angina pectoris: Secondary | ICD-10-CM

## 2022-01-19 ENCOUNTER — Other Ambulatory Visit: Payer: Self-pay

## 2022-01-19 ENCOUNTER — Ambulatory Visit: Payer: 59 | Admitting: Infectious Disease

## 2022-01-19 ENCOUNTER — Encounter: Payer: Self-pay | Admitting: Infectious Disease

## 2022-01-19 VITALS — BP 120/77 | HR 60 | Wt 228.0 lb

## 2022-01-19 DIAGNOSIS — E1142 Type 2 diabetes mellitus with diabetic polyneuropathy: Secondary | ICD-10-CM

## 2022-01-19 DIAGNOSIS — M009 Pyogenic arthritis, unspecified: Secondary | ICD-10-CM | POA: Diagnosis not present

## 2022-01-19 DIAGNOSIS — M869 Osteomyelitis, unspecified: Secondary | ICD-10-CM

## 2022-01-19 DIAGNOSIS — E0843 Diabetes mellitus due to underlying condition with diabetic autonomic (poly)neuropathy: Secondary | ICD-10-CM | POA: Diagnosis not present

## 2022-01-19 DIAGNOSIS — I10 Essential (primary) hypertension: Secondary | ICD-10-CM

## 2022-01-19 DIAGNOSIS — B955 Unspecified streptococcus as the cause of diseases classified elsewhere: Secondary | ICD-10-CM

## 2022-01-19 DIAGNOSIS — R7881 Bacteremia: Secondary | ICD-10-CM

## 2022-01-19 NOTE — Progress Notes (Signed)
Subjective:  Chief complaint: follow-up for septic arthritis of the left knee and osteomyelitis of the foot  Patient ID: [name] [name], male    DOB: [birth year], 66 y.o.   MRN: [id number]  HPI  66 y.o. male with diabetes mellitus obesity, coronary artery disease who had been struggling with bilateral diabetic foot ulcers now admitted with acute worsening of drainage from his right great toe also with onset of erythema consistent with cellulitis in his left lower extremity.  Plain films of shown osteomyelitis in the still phalanx of the great toe and second toe on the right.   RI showed  the ulceration that is obvious on exam with communication of the joint space and first IP joint with septic arthritis and osteomyelitis of first proximal and distal phalanges with pathological fracture of the base of the first distal phalanx with ulceration of the tip of the second toe with underlying osteomyelitis the second distal phalanx tear of the flexor pollicis longus and infectious tenosynovitis with multiple forefoot abscess   He also has an ulcer in his left great toe as well   Fortunately MRI did not show evidence of osteomyelitis in that foot  Patient was taken the operating room and went underwent first toe and partial amputation of second toe.  Cultures grew Streptococcus intermedius and group B streptococcus as well as a beta-lactamase positive Prevotella.  Grew strep intermedius from blood cultures as well.  2D echocardiogram failed to show evidence of endocarditis.  Why was in the hospital I last saw him we had switched him to continuous penicillin along with oral metronidazole.  He ended up changing eventually to ceftriaxone and metronidazole and was discharged home with plans to complete antibiotics on 20 October.  He has been followed closely by Dr. Jacqualyn Posey with podiatry and who was happy with the progress of the foot.   He did have worsening swelling in his left leg where he was f  thought to have a Baker's cyst when he was in the hospital  However the edema worsened and ultimately we referred him to orthopedic surgery and he saw Dr. Erlinda Hong.  He mass had enlarged since then.  Dr. Erlinda Hong performed an aspirate which I believe was from the patients knee with blood-tinged fluid isolated which had 6263 white blood cells with 89% neutrophils 3% lymphocytes gram stain and culture were negative though keep in mind the patient has been on antibiotics.  MRI of the leg was obtained which showed a small knee effusion but with extensive synovitis and edema in the joint margins as well as lateral tibial plateau with some erosive changes that radiology felt could be due to septic joint and early osteomyelitis versus an erosive arthropathy.  There is also medial and lateral meniscal tears, and abnormal ACL with fluid expansion in the area they are compatible with CS cyst and a multilobulated fluid collection posterior to the joint which could be ganglion cyst with also a large Baker's cyst extending down into the calf.  Dr Erlinda Hong performed surgery December 64 with excisional debridement of a large left Calf abscess that was with 700 cm of subcutaneous tissue muscle and pus.  Had culture sent but they did not yield an organism keeping in mind he has been on Augmentin.  He completed Augmentin but was restarted on by orthopedic surgery.  His knee effusion had persisted and he was ultimately taken back to the operating room by Dr. Erlinda Hong who performed left knee arthroscopy with synovectomy and left  knee arthroscopy and arthroscopic partial medial meniscectomy as well as arthroscopic chondroplasty and medial femoral condyle and the patellar surface.  Cultures were unrevealing.  He was seen by my partner Dr. Juleen China and Janene Madeira and ultimately placed on ceftriaxone with plans for him to complete therapy 4 weeks postoperatively which will be today.  He has 1 more dose of ceftriaxone at home and would like to  take this before the PICC line is discontinued.  He was moving around with PT and walking more more but then he did require further surgery to his right foot though I cannot find the note or operative report in epic.  He states that this was done not due to infection but to try to prevent further potential friction at the site.  Right foot is bandaged currently.  Knee pain had improved dramatically since surgery.  Since the last saw him we took him off antibiotics and has been observing him off antibiotics.  He has noticed knee pain when he bears weight but previously was not able to bear much weight at all.  He does not have pain at rest.  He still has somewhat of an effusion present.  Last weekend he did have an isolated event where he felt nauseous did not vomit did have not have diarrhea did not have a measurable fever but had malaise and stayed in bed all day.  He had attributed to a "GI bug"  Since then he has not had recurrence of the symptoms.  His foot is doing well and he has not experienced worsening of his wound or increased pain at that site.  Calf wound is also healing up.              Past Medical History:  Diagnosis Date   Anxiety    Baker's cyst 10/06/2021   Bulbous urethral stricture    chronic--- hx multiple dilatation's and urolume stent's   CAD (coronary artery disease) 04/ 2011  (previous cardiologist-  dr h. Tamala Julian , lov 06/ 2016)  currently followed by pcp   03-03-2010  CABG x 5, LIMA to LAD, left radial artery to OM1,SVG to R1, seqSVG to PDA and PLA   Cyanosis 10/06/2021   Depression    ED (erectile dysfunction)    Essential hypertension, benign    GERD (gastroesophageal reflux disease)    History of pancreatitis 2005   Knee effusion 10/06/2021   Mixed hyperlipidemia    OSA on CPAP    not used recently   S/P CABG x 5 03/03/2010   Septic arthritis of knee, left ([facility]) 11/11/2021   Type II or unspecified type diabetes mellitus without mention  of complication, not stated as uncontrolled    endocrinologist-  dr Buddy Duty    Past Surgical History:  Procedure Laterality Date   AMPUTATION Right 08/09/2021   Procedure: AMPUTATION DIGIT;  Surgeon: Trula Slade, DPM;  Location: [facility];  Service: Podiatry;  Laterality: Right;   CARDIAC CATHETERIZATION  03-01-2010   dr Daneen Schick   nuclear stress test --positive moderate ishemia:  severe multivessel CAD w/ total occlusion of pLAD, HG obstrucion of pPDA with supplies collaterals around the apex to the LAD, severe stenosis distal RCA, total occlusion OM1, 75% D1, normal LVF    CORONARY ARTERY BYPASS GRAFT  03-03-2010  dr hendrickson    LIMA to LAD, SVG to RI, left radial artery to OM1, seqSVG  to PDA and PLA   CYST EXCISION Left 11/02/2021   Procedure: REMOVAL  OF LEFT CALF MASS;  Surgeon: Leandrew Koyanagi, MD;  Location: [place];  Service: Orthopedics;  Laterality: Left;   CYSTO W/ DILATATION , LASER INCISION OF URETHRAL STRICTURE  02-25-2005   dr Karsten Ro  [facility]   CYSTO/  DILATION URETHRAL MEATAL STENOSIS AND DILATION BULBOUS STRICTURE  12-21-2004   dr Terance Hart  [facility]   CYSTO/ RETROGRADE PYELOGRAM/ DILATATION URETHRAL STRICTURE AND PLACEMENT UROLUME STENT  08-04-2011    dr Karsten Ro  [facility]   CYSTOSCOPY WITH DIRECT VISION INTERNAL URETHROTOMY  07/15/2008   dr Karsten Ro  [facility]   and UroLume Stent placement   CYSTOSCOPY WITH URETHRAL DILATATION N/A 04/19/2018   Procedure: CYSTOSCOPY WITH BALLOON URETHRAL DILATATION;  Surgeon: Kathie Rhodes, MD;  Location: [facility];  Service: Urology;  Laterality: N/A;   INCISION AND DRAINAGE Right 08/13/2021   Procedure: INCISION AND DRAINAGE RIGHT FOOT WOUND;  Surgeon: Trula Slade, DPM;  [facility];  Service: Podiatry;  Laterality: Right;   KNEE ARTHROSCOPY Left 11/15/2021   Procedure: LEFT KNEE ARTHROSCOPIC IRRIGATION AND DEBRIDEMENT;  Surgeon: Leandrew Koyanagi, MD;  Location: [place];  Service: Orthopedics;  Laterality: Left;    ORIF TOE FRACTURE Left 05/22/2013   Procedure: OPEN REDUCTION INTERNAL FIXATION (ORIF) LEFT SECOND TOE PROXIMAL PHALANX FRACTURE/LEFT PERCUTANEOUS PINNING;  Surgeon: Wylene Simmer, MD;  Location: [place];  Service: Orthopedics;  Laterality: Left;   TONSILLECTOMY  child    Family History  Problem Relation Age of Onset   Cancer Mother        lung   Hypertension Father    CAD Father    CVA Brother       Social History   Socioeconomic History   Marital status: Married    Spouse name: Not on file   Number of children: Not on file   Years of education: Not on file   Highest education level: Not on file  Occupational History   Not on file  Tobacco Use   Smoking status: Never   Smokeless tobacco: Never  Vaping Use   Vaping Use: Never used  Substance and Sexual Activity   Alcohol use: Yes    Comment: occasional   Drug use: No   Sexual activity: Not Currently  Other Topics Concern   Not on file  Social History Narrative   Not on file   Social Determinants of Health   Financial Resource Strain: Not on file  Food Insecurity: Not on file  Transportation Needs: Not on file  Physical Activity: Not on file  Stress: Not on file  Social Connections: Not on file    Allergies  Allergen Reactions   Hydrochlorothiazide     Other reaction(s): hyponatremia   Metformin Hcl     Other reaction(s): upset stomach     Current Outpatient Medications:    ALPRAZolam (XANAX) 0.5 MG tablet, Take 0.5 mg by mouth 2 (two) times daily as needed for anxiety., Disp: , Rfl:    amLODipine (NORVASC) 10 MG tablet, Take 10 mg by mouth at bedtime., Disp: , Rfl:    aspirin 325 MG tablet, Take 325 mg by mouth daily., Disp: , Rfl:    atorvastatin (LIPITOR) 80 MG tablet, Take 80 mg by mouth every evening., Disp: , Rfl:    buPROPion (WELLBUTRIN XL) 300 MG 24 hr tablet, Take 300 mg by mouth every morning. , Disp: , Rfl:    collagenase (SANTYL) ointment, Apply 1 application topically daily., Disp: 90 g, Rfl:  0  fenofibrate 160 MG tablet, Take 160 mg by mouth every evening., Disp: , Rfl:    fish oil-omega-3 fatty acids 1000 MG capsule, Take 1 g by mouth daily. , Disp: , Rfl:    HUMALOG MIX 50/50 KWIKPEN (50-50) 100 UNIT/ML Kwikpen, Inject 40 Units into the skin 3 (three) times daily before meals., Disp: , Rfl:    metoprolol tartrate (LOPRESSOR) 100 MG tablet, Take 100 mg by mouth 2 (two) times daily., Disp: , Rfl:    naloxone (NARCAN) nasal spray 4 mg/0.1 mL, Place 1 spray into the nose daily., Disp: , Rfl:    NOVOFINE 32G X 6 MM MISC, , Disp: , Rfl:    olmesartan-hydrochlorothiazide (BENICAR HCT) 40-25 MG per tablet, Take 1 tablet by mouth every morning. , Disp: , Rfl:    omeprazole (PRILOSEC) 20 MG capsule, Take 20 mg by mouth every evening. , Disp: , Rfl:    ondansetron (ZOFRAN) 4 MG tablet, Take 1 tablet (4 mg total) by mouth every 8 (eight) hours as needed for nausea or vomiting., Disp: 40 tablet, Rfl: 0   oxyCODONE-acetaminophen (PERCOCET) 5-325 MG tablet, Take 1-2 tablets by mouth every 6 (six) hours as needed for severe pain., Disp: 40 tablet, Rfl: 0   polyethylene glycol (MIRALAX / GLYCOLAX) 17 g packet, Take 17 g by mouth daily as needed for moderate constipation., Disp: 14 each, Rfl: 0   potassium chloride SA (K-DUR,KLOR-CON) 20 MEQ tablet, Take 40 mEq by mouth every morning., Disp: , Rfl:    Review of Systems  Constitutional:  Positive for fatigue. Negative for activity change, appetite change, chills, diaphoresis, fever and unexpected weight change.  HENT:  Negative for congestion, rhinorrhea, sinus pressure, sneezing, sore throat and trouble swallowing.   Eyes:  Negative for photophobia and visual disturbance.  Respiratory:  Negative for cough, chest tightness, shortness of breath, wheezing and stridor.   Cardiovascular:  Negative for chest pain, palpitations and leg swelling.  Gastrointestinal:  Positive for nausea. Negative for abdominal distention, abdominal pain, anal bleeding,  blood in stool, constipation, diarrhea and vomiting.  Genitourinary:  Negative for difficulty urinating, dysuria, flank pain and hematuria.  Musculoskeletal:  Negative for arthralgias, back pain, gait problem, joint swelling and myalgias.  Skin:  Negative for color change, pallor, rash and wound.  Neurological:  Negative for dizziness, tremors, weakness and light-headedness.  Hematological:  Negative for adenopathy. Does not bruise/bleed easily.  Psychiatric/Behavioral:  Negative for agitation, behavioral problems, confusion, decreased concentration, dysphoric mood and sleep disturbance.       Objective:   Physical Exam Constitutional:      Appearance: He is well-developed.  HENT:     Head: Normocephalic and atraumatic.  Eyes:     Conjunctiva/sclera: Conjunctivae normal.  Cardiovascular:     Rate and Rhythm: Normal rate and regular rhythm.  Pulmonary:     Effort: Pulmonary effort is normal. No respiratory distress.     Breath sounds: No wheezing.  Abdominal:     General: There is no distension.     Palpations: Abdomen is soft.  Musculoskeletal:        General: No tenderness. Normal range of motion.     Cervical back: Normal range of motion and neck supple.  Skin:    General: Skin is warm and dry.     Coloration: Skin is not pale.     Findings: No erythema or rash.  Neurological:     General: No focal deficit present.     Mental Status: He is alert  and oriented to person, place, and time.  Psychiatric:        Mood and Affect: Mood normal.        Behavior: Behavior normal.        Thought Content: Thought content normal.        Judgment: Judgment normal.        Left foot  December 14, 2021 right he has a healing abrasion:    Left foot 01/19/2022:     Right foot 01/19/2022     Left knee 01/19/2022     Left calf :                Assessment & Plan:   Septic arthritis of the native left knee status post surgery and 6 weeks of parenteral  antibiotics  Suspicion is that this site was seeded by the Streptococcus intermedius bacteremia he had.  There was also some concern about possible osteomyelitis on MRI.  We have been observing off antibiotics he seems to been doing relatively well though I am a little bit bothered by his recent episode of nausea malaise and dry heaving that happened last weekend.  We will repeat sed rate CRP CBC and CMP today.   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  This site appears to be either cured or quiescent   Diabetes mellitus: On insulin.  Hypertension continue on metoprolol  Hypertension continue metoprolol

## 2022-01-20 ENCOUNTER — Ambulatory Visit
Admission: RE | Admit: 2022-01-20 | Discharge: 2022-01-20 | Disposition: A | Discharge status: Home / Self Care | Payer: 59 | Source: Ambulatory Visit | Attending: Internal Medicine | Admitting: Internal Medicine

## 2022-01-20 DIAGNOSIS — I2510 Atherosclerotic heart disease of native coronary artery without angina pectoris: Secondary | ICD-10-CM

## 2022-01-20 DIAGNOSIS — R0989 Other specified symptoms and signs involving the circulatory and respiratory systems: Secondary | ICD-10-CM

## 2022-01-20 LAB — CBC WITH DIFFERENTIAL/PLATELET
Absolute Monocytes: 949 cells/uL (ref 200–950)
Basophils Absolute: 71 cells/uL (ref 0–200)
Basophils Relative: 0.7 %
Eosinophils Absolute: 434 cells/uL (ref 15–500)
Eosinophils Relative: 4.3 %
HCT: 38.1 % — ABNORMAL LOW (ref 38.5–50.0)
Hemoglobin: 11.9 g/dL — ABNORMAL LOW (ref 13.2–17.1)
Lymphs Abs: 2454 cells/uL (ref 850–3900)
MCH: 23.4 pg — ABNORMAL LOW (ref 27.0–33.0)
MCHC: 31.2 g/dL — ABNORMAL LOW (ref 32.0–36.0)
MCV: 75 fL — ABNORMAL LOW (ref 80.0–100.0)
MPV: 9.4 fL (ref 7.5–12.5)
Monocytes Relative: 9.4 %
Neutro Abs: 6191 cells/uL (ref 1500–7800)
Neutrophils Relative %: 61.3 %
Platelets: 407 10*3/uL — ABNORMAL HIGH (ref 140–400)
RBC: 5.08 10*6/uL (ref 4.20–5.80)
RDW: 17.7 % — ABNORMAL HIGH (ref 11.0–15.0)
Total Lymphocyte: 24.3 %
WBC: 10.1 10*3/uL (ref 3.8–10.8)

## 2022-01-20 LAB — BASIC METABOLIC PANEL WITH GFR
BUN/Creatinine Ratio: 14 (calc) (ref 6–22)
BUN: 27 mg/dL — ABNORMAL HIGH (ref 7–25)
CO2: 21 mmol/L (ref 20–32)
Calcium: 9.5 mg/dL (ref 8.6–10.3)
Chloride: 95 mmol/L — ABNORMAL LOW (ref 98–110)
Creat: 1.93 mg/dL — ABNORMAL HIGH (ref 0.70–1.35)
Glucose, Bld: 102 mg/dL — ABNORMAL HIGH (ref 65–99)
Potassium: 4.9 mmol/L (ref 3.5–5.3)
Sodium: 126 mmol/L — ABNORMAL LOW (ref 135–146)
eGFR: 38 mL/min/{1.73_m2} — ABNORMAL LOW (ref >=60)

## 2022-01-20 LAB — SEDIMENTATION RATE: Sed Rate: 19 mm/h (ref 0–20)

## 2022-01-20 LAB — C-REACTIVE PROTEIN: CRP: 14 mg/L — ABNORMAL HIGH (ref <8.0)

## 2022-01-26 ENCOUNTER — Telehealth: Payer: Self-pay

## 2022-01-26 NOTE — Telephone Encounter (Signed)
Casts Sent to [employer] °

## 2022-02-02 ENCOUNTER — Ambulatory Visit (INDEPENDENT_AMBULATORY_CARE_PROVIDER_SITE_OTHER): Payer: 59 | Admitting: Podiatry

## 2022-02-02 ENCOUNTER — Other Ambulatory Visit: Payer: Self-pay

## 2022-02-02 DIAGNOSIS — M898X7 Other specified disorders of bone, ankle and foot: Secondary | ICD-10-CM

## 2022-02-02 DIAGNOSIS — E1149 Type 2 diabetes mellitus with other diabetic neurological complication: Secondary | ICD-10-CM

## 2022-02-02 DIAGNOSIS — Z9889 Other specified postprocedural states: Secondary | ICD-10-CM

## 2022-02-06 ENCOUNTER — Other Ambulatory Visit: Payer: Self-pay | Admitting: Physician Assistant

## 2022-02-06 ENCOUNTER — Encounter: Payer: Self-pay | Admitting: Orthopaedic Surgery

## 2022-02-06 MED ORDER — HYDROCODONE-ACETAMINOPHEN 5-325 MG PO TABS: 1.0000 | ORAL_TABLET | Freq: Two times a day (BID) | ORAL | 0 refills | Status: DC | PRN

## 2022-02-06 NOTE — Progress Notes (Signed)
Subjective: ?[name] [name] is a 66 y.o. is seen today in office s/p right foot exostectomy, partial first metatarsal resection preformed on 12/07/2021.  States he has been doing well.  He is back wearing regular shoes and he is walking with a cane.  The wounds of healed.  No increase in swelling.  No drainage.  Denies any fevers or chills.  No other concerns today.   ? ?Objective: ?General: No acute distress, AAOx3  ?DP/PT pulses palpable 2/4, CRT < 3 sec to all digits.  ?Right foot: Incision is well coapted without any evidence of dehiscence and scars have formed.  There is trace edema on the surgical site.  There is no erythema or warmth.  There is no open lesions.   ?Left foot: No significant hyperkeratotic tissue present there is no open lesions. ?No pain with calf compression, swelling, warmth, erythema.  ? ? ?Assessment and Plan:  ?Status post right foot surgery, doing well with no complications ; healed wound left foot ? ?-Treatment options discussed including all alternatives, risks, and complications ?-Overall doing much better is back to wearing regular shoes.  Awaiting insert.  Discussed importance of daily foot inspection.  Gradual increase activity level as tolerated. ? ?Trula Slade DPM ?

## 2022-02-06 NOTE — Telephone Encounter (Signed)
Sent in [place]

## 2022-02-14 ENCOUNTER — Ambulatory Visit (INDEPENDENT_AMBULATORY_CARE_PROVIDER_SITE_OTHER): Payer: 59 | Admitting: Orthopaedic Surgery

## 2022-02-14 ENCOUNTER — Other Ambulatory Visit: Payer: Self-pay

## 2022-02-14 ENCOUNTER — Encounter: Payer: Self-pay | Admitting: Orthopaedic Surgery

## 2022-02-14 DIAGNOSIS — M009 Pyogenic arthritis, unspecified: Secondary | ICD-10-CM | POA: Diagnosis not present

## 2022-02-14 NOTE — Progress Notes (Addendum)
Office Visit Note   Patient: [name] [name]           Date of Birth: February 27, 1956           MRN: [id number] Visit Date: 02/14/2022              Requested by: Tally Joe, MD [phone] [place],  [place] [address] PCP: Tally Joe, MD   Assessment & Plan: Visit Diagnoses:  1. Pyogenic arthritis of left knee joint, due to unspecified organism [facility])     Plan: Impression is 3 months status post I&D left septic knee likely from seeded strep intermedius bacteremia.  At this point, he has been off of antibiotics for one month.  We would like him to follow up no sooner than 2 months to aspirate the left knee to rule out infection as he is interested in total knee arthroplasty at some point in the future.  He understands that it is vitally important to rule out any possibility of a latent infection prior to undergoing a knee replacement.   Fortunately he is feeling better overall and has been able to walk limited distances with a cane.  He is scheduled to follow-up with infectious disease in a couple months anyways and will have lab work repeated at that time.  Follow-Up Instructions: Return if symptoms worsen or fail to improve.   Orders:  No orders of the defined types were placed in this encounter.  No orders of the defined types were placed in this encounter.     Procedures: No procedures performed   Clinical Data: No additional findings.   Subjective: Chief Complaint  Patient presents with   Left Knee - Follow-up    HPI patient is a pleasant 66 year old gentleman who comes in today with chronic left knee pain.  He underwent left knee I&D on 11/15/2021 for septic arthritis.  Intraoperative cultures were negative to date but it was thought that this was from seated strep intermedius bacteremia.  It was noted during operative intervention that he had grade 4 changes to the medial compartment.  He notes that he finished his antibiotic about a month ago.  He does  note chronic pain but this has improved since surgery.  Pain is worse with activity.  No fevers or chills.  He has been taking Tylenol as needed.  He was seen by Dr. Algis Liming for follow-up on 01/19/2022 where his CRP was elevated.  Sedimentation rate white blood cell count were within normal limits.  Review of Systems as detailed in HPI.  All others reviewed and are negative.   Objective: Vital Signs: There were no vitals taken for this visit.  Physical Exam well-developed well-nourished gentleman in no acute distress.  Alert and oriented x3.  Ortho Exam left knee exam shows moderate effusion.  Range of motion 0 to 95 degrees with moderate pain.  Mild lateral joint line tenderness.  Medial joint line tenderness.  He is stable to varus valgus stress.  He is neurovascular intact distally.   Fully healed surgical scars.  Calf is soft and nontender.  Specialty Comments:  No specialty comments available.  Imaging: No new imaging   PMFS History: Patient Active Problem List   Diagnosis Date Noted   Septic arthritis of knee, left ([facility]) 11/11/2021   Mass of lower leg, left 11/02/2021   Amputation of right great toe ([facility]CC) 10/31/2021   Anxiety 10/31/2021   Chronic kidney disease, stage 3b ([facility]CC) 10/31/2021   Diabetic renal  disease (HCC) 10/31/2021   Gastroesophageal reflux disease without esophagitis 10/31/2021   History of amputation of great toe (HCC) 10/31/2021   Hyperglycemia due to type 2 diabetes mellitus ([facility]CC) 10/31/2021   Leukocytosis 10/31/2021   Long term (current) use of insulin (HCC) 10/31/2021   Malignant hypertensive chronic kidney disease 10/31/2021   Male hypogonadism 10/31/2021   Mass of left adrenal gland (HCC) 10/31/2021   Obstructive sleep apnea syndrome 10/31/2021   Other specified disorders of bladder 10/31/2021   Polyneuropathy due to type 2 diabetes mellitus (HCC) 10/31/2021   Baker's cyst, left 10/06/2021   Knee effusion, left 10/06/2021   Cyanosis 10/06/2021    Hyponatremia    AKI (acute kidney injury) (HCC)    Elevated LFTs    Post-operative state    Cellulitis of left lower extremity    Streptococcal bacteremia    Cellulitis of right lower extremity    Osteomyelitis of great toe of right foot (HCC)    Osteomyelitis of second toe of right foot (HCC)    Diabetes mellitus due to underlying condition with diabetic autonomic neuropathy, without long-term current use of insulin (HCC)    Class 3 severe obesity with serious comorbidity and body mass index (BMI) of 45.0 to 49.9 in adult (HCC)    Toe infection 08/08/2021   CAD (coronary artery disease) of artery bypass graft 04/21/2015   Hyperlipidemia 04/21/2015   Essential hypertension 04/21/2015   Type 2 diabetes mellitus (HCC) 04/21/2015   Ulcer of foot (HCC) 08/22/2014   Past Medical History:  Diagnosis Date   Anxiety    Baker's cyst 10/06/2021   Bulbous urethral stricture    chronic--- hx multiple dilatation's and urolume stent's   CAD (coronary artery disease) 04/ 2011  (previous cardiologist-  dr h. Katrinka Blazing , lov 06/ 2016)  currently followed by pcp   03-03-2010  CABG x 5, LIMA to LAD, left radial artery to OM1,SVG to R1, seqSVG to PDA and PLA   Cyanosis 10/06/2021   Depression    ED (erectile dysfunction)    Essential hypertension, benign    GERD (gastroesophageal reflux disease)    History of pancreatitis 2005   Knee effusion 10/06/2021   Mixed hyperlipidemia    OSA on CPAP    not used recently   S/P CABG x 5 03/03/2010   Septic arthritis of knee, left ([facility]) 11/11/2021   Type II or unspecified type diabetes mellitus without mention of complication, not stated as uncontrolled    endocrinologist-  dr Sharl Ma    Family History  Problem Relation Age of Onset   Cancer Mother        lung   Hypertension Father    CAD Father    CVA Brother     Past Surgical History:  Procedure Laterality Date   AMPUTATION Right 08/09/2021   Procedure: AMPUTATION DIGIT;  Surgeon: Vivi Barrack,  DPM;  [facility];  Service: Podiatry;  Laterality: Right;   CARDIAC CATHETERIZATION  03-01-2010   dr Verdis Prime   nuclear stress test --positive moderate ishemia:  severe multivessel CAD w/ total occlusion of pLAD, HG obstrucion of pPDA with supplies collaterals around the apex to the LAD, severe stenosis distal RCA, total occlusion OM1, 75% D1, normal LVF    CORONARY ARTERY BYPASS GRAFT  03-03-2010  dr hendrickson    LIMA to LAD, SVG to RI, left radial artery to OM1, seqSVG  to PDA and PLA   CYST EXCISION Left 11/02/2021   Procedure: REMOVAL OF  LEFT CALF MASS;  Surgeon: Tarry Kos, MD;  Location: [facility];  Service: Orthopedics;  Laterality: Left;   CYSTO W/ DILATATION , LASER INCISION OF URETHRAL STRICTURE  02-25-2005   dr Vernie Ammons  [facility]   CYSTO/  DILATION URETHRAL MEATAL STENOSIS AND DILATION BULBOUS STRICTURE  12-21-2004   dr Vonita Moss  [facility]   CYSTO/ RETROGRADE PYELOGRAM/ DILATATION URETHRAL STRICTURE AND PLACEMENT UROLUME STENT  08-04-2011    dr Vernie Ammons  [facility]   CYSTOSCOPY WITH DIRECT VISION INTERNAL URETHROTOMY  07/15/2008   dr Vernie Ammons  [facility]   and UroLume Stent placement   CYSTOSCOPY WITH URETHRAL DILATATION N/A 04/19/2018   Procedure: CYSTOSCOPY WITH BALLOON URETHRAL DILATATION;  Surgeon: Ihor Gully, MD;  Location: [facility];  Service: Urology;  Laterality: N/A;   INCISION AND DRAINAGE Right 08/13/2021   Procedure: INCISION AND DRAINAGE RIGHT FOOT WOUND;  Surgeon: Vivi Barrack, DPM;  [facility];  Service: Podiatry;  Laterality: Right;   KNEE ARTHROSCOPY Left 11/15/2021   Procedure: LEFT KNEE ARTHROSCOPIC IRRIGATION AND DEBRIDEMENT;  Surgeon: Tarry Kos, MD;  Location: [facility];  Service: Orthopedics;  Laterality: Left;   ORIF TOE FRACTURE Left 05/22/2013   Procedure: OPEN REDUCTION INTERNAL FIXATION (ORIF) LEFT SECOND TOE PROXIMAL PHALANX FRACTURE/LEFT PERCUTANEOUS PINNING;  Surgeon: Toni Arthurs, MD;  Location: [facility];  Service:  Orthopedics;  Laterality: Left;   TONSILLECTOMY  child   Social History   Occupational History   Not on file  Tobacco Use   Smoking status: Never   Smokeless tobacco: Never  Vaping Use   Vaping Use: Never used  Substance and Sexual Activity   Alcohol use: Yes    Comment: occasional   Drug use: No   Sexual activity: Not Currently

## 2022-03-23 ENCOUNTER — Other Ambulatory Visit: Payer: Self-pay

## 2022-03-23 ENCOUNTER — Encounter: Payer: Self-pay | Admitting: Infectious Disease

## 2022-03-23 ENCOUNTER — Ambulatory Visit (INDEPENDENT_AMBULATORY_CARE_PROVIDER_SITE_OTHER): Payer: 59 | Admitting: Infectious Disease

## 2022-03-23 VITALS — BP 141/79 | HR 55 | Resp 16 | Ht 72.0 in | Wt 262.2 lb

## 2022-03-23 DIAGNOSIS — S98111A Complete traumatic amputation of right great toe, initial encounter: Secondary | ICD-10-CM

## 2022-03-23 DIAGNOSIS — E0843 Diabetes mellitus due to underlying condition with diabetic autonomic (poly)neuropathy: Secondary | ICD-10-CM

## 2022-03-23 DIAGNOSIS — E1169 Type 2 diabetes mellitus with other specified complication: Secondary | ICD-10-CM | POA: Diagnosis not present

## 2022-03-23 DIAGNOSIS — N1832 Chronic kidney disease, stage 3b: Secondary | ICD-10-CM

## 2022-03-23 DIAGNOSIS — E1143 Type 2 diabetes mellitus with diabetic autonomic (poly)neuropathy: Secondary | ICD-10-CM

## 2022-03-23 DIAGNOSIS — E1142 Type 2 diabetes mellitus with diabetic polyneuropathy: Secondary | ICD-10-CM

## 2022-03-23 DIAGNOSIS — M009 Pyogenic arthritis, unspecified: Secondary | ICD-10-CM | POA: Diagnosis not present

## 2022-03-23 DIAGNOSIS — M869 Osteomyelitis, unspecified: Secondary | ICD-10-CM | POA: Diagnosis not present

## 2022-03-23 DIAGNOSIS — E1122 Type 2 diabetes mellitus with diabetic chronic kidney disease: Secondary | ICD-10-CM

## 2022-03-23 NOTE — Progress Notes (Signed)
? ?Subjective:  ?Chief complaint:  follow-up for septic arthritis of the left knee osteomyelitis of the foot still with some swelling and pain in the left knee.  Patient ID: [name] [name], male    DOB: April 27, 1956, 66 y.o.   MRN: [id number] ? ?HPI ? ?66 y.o. male with diabetes mellitus obesity, coronary artery disease who had been struggling with bilateral diabetic foot ulcers now admitted with acute worsening of drainage from his right great toe also with onset of erythema consistent with cellulitis in his left lower extremity. ? ?Plain films of shown osteomyelitis in the still phalanx of the great toe and second toe on the right. ?  ?RI showed  the ulceration that is obvious on exam with communication of the joint space and first IP joint with septic arthritis and osteomyelitis of first proximal and distal phalanges with pathological fracture of the base of the first distal phalanx with ulceration of the tip of the second toe with underlying osteomyelitis the second distal phalanx tear of the flexor pollicis longus and infectious tenosynovitis with multiple forefoot abscess ?  ?He also has an ulcer in his left great toe as well ?  ?Fortunately MRI did not show evidence of osteomyelitis in that foot ? ?Patient was taken the operating room and went underwent first toe and partial amputation of second toe.  Cultures grew Streptococcus intermedius and group B streptococcus as well as a beta-lactamase positive Prevotella. ? ?Grew strep intermedius from blood cultures as well.  2D echocardiogram failed to show evidence of endocarditis. ? ?Why was in the hospital I last saw him we had switched him to continuous penicillin along with oral metronidazole. ? ?He ended up changing eventually to ceftriaxone and metronidazole and was discharged home with plans to complete antibiotics on 20 October. ? ?He has been followed closely by Dr. Ardelle AntonWagoner with podiatry and who was happy with the progress of the foot. ? ? ?He did have  worsening swelling in his left leg where he was f thought to have a Baker's cyst when he was in the hospital ? ?However the edema worsened and ultimately we referred him to orthopedic surgery and he saw Dr. Roda ShuttersXu.  He mass had enlarged since then.  Dr. Roda ShuttersXu performed an aspirate which I believe was from the patients knee with blood-tinged fluid isolated which had 6263 white blood cells with 89% neutrophils 3% lymphocytes gram stain and culture were negative though keep in mind the patient has been on antibiotics. ? ?MRI of the leg was obtained which showed a small knee effusion but with extensive synovitis and edema in the joint margins as well as lateral tibial plateau with some erosive changes that radiology felt could be due to septic joint and early osteomyelitis versus an erosive arthropathy.  There is also medial and lateral meniscal tears, and abnormal ACL with fluid expansion in the area they are compatible with CS cyst and a multilobulated fluid collection posterior to the joint which could be ganglion cyst with also a large Baker's cyst extending down into the calf. ? ?Dr Roda ShuttersXu performed surgery December 14 with excisional debridement of a large left Calf abscess that was with 700 cm? of subcutaneous tissue muscle and pus. ? ?Had culture sent but they did not yield an organism keeping in mind he has been on Augmentin. ? ?He completed Augmentin but was restarted on by orthopedic surgery. ? ?His knee effusion had persisted and he was ultimately taken back to the operating room by Dr.  Xu who performed left knee arthroscopy with synovectomy and left knee arthroscopy and arthroscopic partial medial meniscectomy as well as arthroscopic chondroplasty and medial femoral condyle and the patellar surface. ? ?Cultures were unrevealing.  He was seen by my partner Dr. Earlene Plater and Rexene Alberts and ultimately placed on ceftriaxone with plans for him to complete therapy 4 weeks postoperatively which will be today.  He has 1  more dose of ceftriaxone at home and would like to take this before the PICC line is discontinued. ? ?He was moving around with PT and walking more more but then he did require further surgery to his right foot though I cannot find the note or operative report in epic. ? ?He states that this was done not due to infection but to try to prevent further potential friction at the site.  Right foot is bandaged currently. ? ?Knee pain had improved dramatically since surgery. ? ?Since the last saw him we took him off antibiotics and has been observing him off antibiotics. ? ?He had  noticed knee pain when he bears weight but previously was not able to bear much weight at all.  He does not have pain at rest.  He still has somewhat of an effusion present. ?  ?Since I last saw him he continues to have pain in the knee with standing but not worse. ? ?Still does have a bit of an effusion here as well he is eventually wanting to have a total knee replacement at the site. ? ?Right foot is well-healed and not having worsening pain ? ? ? ? ? ? ? ? ? ?Past Medical History:  ?Diagnosis Date  ? Anxiety   ? Baker's cyst 10/06/2021  ? Bulbous urethral stricture   ? chronic--- hx multiple dilatation's and urolume stent's  ? CAD (coronary artery disease) 04/ 2011  (previous cardiologist-  dr h. Katrinka Blazing , lov 06/ 2016)  currently followed by pcp  ? 03-03-2010  CABG x 5, LIMA to LAD, left radial artery to OM1,SVG to R1, seqSVG to PDA and PLA  ? Cyanosis 10/06/2021  ? Depression   ? ED (erectile dysfunction)   ? Essential hypertension, benign   ? GERD (gastroesophageal reflux disease)   ? History of pancreatitis 2005  ? Knee effusion 10/06/2021  ? Mixed hyperlipidemia   ? OSA on CPAP   ? not used recently  ? S/P CABG x 5 03/03/2010  ? Septic arthritis of knee, left (HCC) 11/11/2021  ? Type II or unspecified type diabetes mellitus without mention of complication, not stated as uncontrolled   ? endocrinologist-  dr Sharl Ma  ? ? ?Past Surgical  History:  ?Procedure Laterality Date  ? AMPUTATION Right 08/09/2021  ? Procedure: AMPUTATION DIGIT;  Surgeon: Vivi Barrack, DPM;  [facility];  Service: Podiatry;  Laterality: Right;  ? CARDIAC CATHETERIZATION  03-01-2010   dr Verdis Prime  ? nuclear stress test --positive moderate ishemia:  severe multivessel CAD w/ total occlusion of pLAD, HG obstrucion of pPDA with supplies collaterals around the apex to the LAD, severe stenosis distal RCA, total occlusion OM1, 75% D1, normal LVF   ? CORONARY ARTERY BYPASS GRAFT  03-03-2010  dr hendrickson  ?  LIMA to LAD, SVG to RI, left radial artery to OM1, seqSVG  to PDA and PLA  ? CYST EXCISION Left 11/02/2021  ? Procedure: REMOVAL OF LEFT CALF MASS;  Surgeon: Tarry Kos, MD;  Location: [facility];  Service: Orthopedics;  Laterality:  Left;  ? CYSTO W/ DILATATION , LASER INCISION OF URETHRAL STRICTURE  02-25-2005   dr Vernie Ammons  [facility]  ? CYSTO/  DILATION URETHRAL MEATAL STENOSIS AND DILATION BULBOUS STRICTURE  12-21-2004   dr Vonita Moss  [facility]  ? CYSTO/ RETROGRADE PYELOGRAM/ DILATATION URETHRAL STRICTURE AND PLACEMENT UROLUME STENT  08-04-2011    dr Vernie Ammons  [facility]  ? CYSTOSCOPY WITH DIRECT VISION INTERNAL URETHROTOMY  07/15/2008   dr Vernie Ammons  [facility]  ? and UroLume Stent placement  ? CYSTOSCOPY WITH URETHRAL DILATATION N/A 04/19/2018  ? Procedure: CYSTOSCOPY WITH BALLOON URETHRAL DILATATION;  Surgeon: Ihor Gully, MD;  Location: [facility];  Service: Urology;  Laterality: N/A;  ? INCISION AND DRAINAGE Right 08/13/2021  ? Procedure: INCISION AND DRAINAGE RIGHT FOOT WOUND;  Surgeon: Vivi Barrack, DPM;  [facility];  Service: Podiatry;  Laterality: Right;  ? KNEE ARTHROSCOPY Left 11/15/2021  ? Procedure: LEFT KNEE ARTHROSCOPIC IRRIGATION AND DEBRIDEMENT;  Surgeon: Tarry Kos, MD;  Location: [facility];  Service: Orthopedics;  Laterality: Left;  ? ORIF TOE FRACTURE Left 05/22/2013  ? Procedure: OPEN REDUCTION INTERNAL FIXATION (ORIF) LEFT  SECOND TOE PROXIMAL PHALANX FRACTURE/LEFT PERCUTANEOUS PINNING;  Surgeon: Toni Arthurs, MD;  Location: [facility];  Service: Orthopedics;  Laterality: Left;  ? TONSILLECTOMY  child  ? ? ?Family History  ?Problem Relatio

## 2022-03-30 ENCOUNTER — Encounter: Payer: Self-pay | Admitting: Infectious Disease

## 2022-03-30 DIAGNOSIS — M869 Osteomyelitis, unspecified: Secondary | ICD-10-CM

## 2022-04-03 ENCOUNTER — Other Ambulatory Visit: Payer: Self-pay

## 2022-04-03 ENCOUNTER — Other Ambulatory Visit: Payer: 59

## 2022-04-03 DIAGNOSIS — M869 Osteomyelitis, unspecified: Secondary | ICD-10-CM

## 2022-04-04 ENCOUNTER — Ambulatory Visit (INDEPENDENT_AMBULATORY_CARE_PROVIDER_SITE_OTHER): Payer: 59

## 2022-04-04 ENCOUNTER — Ambulatory Visit (INDEPENDENT_AMBULATORY_CARE_PROVIDER_SITE_OTHER): Payer: 59 | Admitting: Podiatry

## 2022-04-04 DIAGNOSIS — L84 Corns and callosities: Secondary | ICD-10-CM

## 2022-04-04 DIAGNOSIS — M79674 Pain in right toe(s): Secondary | ICD-10-CM

## 2022-04-04 DIAGNOSIS — E1149 Type 2 diabetes mellitus with other diabetic neurological complication: Secondary | ICD-10-CM

## 2022-04-04 DIAGNOSIS — Z9889 Other specified postprocedural states: Secondary | ICD-10-CM | POA: Diagnosis not present

## 2022-04-04 DIAGNOSIS — B351 Tinea unguium: Secondary | ICD-10-CM

## 2022-04-04 DIAGNOSIS — M79675 Pain in left toe(s): Secondary | ICD-10-CM | POA: Diagnosis not present

## 2022-04-04 DIAGNOSIS — M898X7 Other specified disorders of bone, ankle and foot: Secondary | ICD-10-CM

## 2022-04-04 LAB — SEDIMENTATION RATE: Sed Rate: 6 mm/h (ref 0–20)

## 2022-04-04 LAB — C-REACTIVE PROTEIN: CRP: 1.9 mg/L (ref <8.0)

## 2022-04-04 NOTE — Progress Notes (Signed)
SITUATION ?Reason for Visit: Fitting of Diabetic Shoes & Insoles ?Patient / Caregiver Report:  Patient is satisfied with fit and function of shoes and insoles. ? ?OBJECTIVE DATA: ?Patient History / Diagnosis:   ?  ICD-10-CM   ?1. Type II diabetes mellitus with neurological manifestations (HCC)  E11.49   ?  ?2. Status post right foot surgery  Z98.890   ?  ? ? ?Change in Status:   None ? ?ACTIONS PERFORMED: ?In-Person Delivery, patient was fit with: ?- 1x pair [id number] PDAC approved prefabricated Diabetic Shoes: Orthofeet Sprint 672 13W ?- 3x pair [id number] PDAC approved vacuum formed custom diabetic insoles; [employer]: [id number] ? ?Shoes and insoles were verified for structural integrity and safety. Patient wore shoes and insoles in office. Skin was inspected and free of areas of concern after wearing shoes and inserts. Shoes and inserts fit properly. Patient / Caregiver provided with ferbal instruction and demonstration regarding donning, doffing, wear, care, proper fit, function, purpose, cleaning, and use of shoes and insoles ' and in all related precautions and risks and benefits regarding shoes and insoles. Patient / Caregiver was instructed to wear properly fitting socks with shoes at all times. Patient was also provided with verbal instruction regarding how to report any failures or malfunctions of shoes or inserts, and necessary follow up care. Patient / Caregiver was also instructed to contact physician regarding change in status that may affect function of shoes and inserts.  ? ?Patient / Caregiver verbalized undersatnding of instruction provided. Patient / Caregiver demonstrated independence with proper donning and doffing of shoes and inserts. ? ?PLAN ?Patient to follow with treating physician as recommended. Plan of care was discussed with and agreed upon by patient and/or caregiver. All questions were answered and concerns addressed. ? ?

## 2022-04-04 NOTE — Patient Instructions (Signed)
Diabetes Mellitus and Foot Care Foot care is an important part of your health, especially when you have diabetes. Diabetes may cause you to have problems because of poor blood flow (circulation) to your feet and legs, which can cause your skin to: Become thinner and drier. Break more easily. Heal more slowly. Peel and crack. You may also have nerve damage (neuropathy) in your legs and feet, causing decreased feeling in them. This means that you may not notice minor injuries to your feet that could lead to more serious problems. Noticing and addressing any potential problems early is the best way to prevent future foot problems. How to care for your feet Foot hygiene  Wash your feet daily with warm water and mild soap. Do not use hot water. Then, pat your feet and the areas between your toes until they are completely dry. Do not soak your feet as this can dry your skin. Trim your toenails straight across. Do not dig under them or around the cuticle. File the edges of your nails with an emery board or nail file. Apply a moisturizing lotion or petroleum jelly to the skin on your feet and to dry, brittle toenails. Use lotion that does not contain alcohol and is unscented. Do not apply lotion between your toes. Shoes and socks Wear clean socks or stockings every day. Make sure they are not too tight. Do not wear knee-high stockings since they may decrease blood flow to your legs. Wear shoes that fit properly and have enough cushioning. Always look in your shoes before you put them on to be sure there are no objects inside. To break in new shoes, wear them for just a few hours a day. This prevents injuries on your feet. Wounds, scrapes, corns, and calluses  Check your feet daily for blisters, cuts, bruises, sores, and redness. If you cannot see the bottom of your feet, use a mirror or ask someone for help. Do not cut corns or calluses or try to remove them with medicine. If you find a minor scrape,  cut, or break in the skin on your feet, keep it and the skin around it clean and dry. You may clean these areas with mild soap and water. Do not clean the area with peroxide, alcohol, or iodine. If you have a wound, scrape, corn, or callus on your foot, look at it several times a day to make sure it is healing and not infected. Check for: Redness, swelling, or pain. Fluid or blood. Warmth. Pus or a bad smell. General tips Do not cross your legs. This may decrease blood flow to your feet. Do not use heating pads or hot water bottles on your feet. They may burn your skin. If you have lost feeling in your feet or legs, you may not know this is happening until it is too late. Protect your feet from hot and cold by wearing shoes, such as at the beach or on hot pavement. Schedule a complete foot exam at least once a year (annually) or more often if you have foot problems. Report any cuts, sores, or bruises to your health care provider immediately. Where to find more information American Diabetes Association: www.diabetes.org Association of Diabetes Care & Education Specialists: www.diabeteseducator.org Contact a health care provider if: You have a medical condition that increases your risk of infection and you have any cuts, sores, or bruises on your feet. You have an injury that is not healing. You have redness on your legs or feet. You   feel burning or tingling in your legs or feet. You have pain or cramps in your legs and feet. Your legs or feet are numb. Your feet always feel cold. You have pain around any toenails. Get help right away if: You have a wound, scrape, corn, or callus on your foot and: You have pain, swelling, or redness that gets worse. You have fluid or blood coming from the wound, scrape, corn, or callus. Your wound, scrape, corn, or callus feels warm to the touch. You have pus or a bad smell coming from the wound, scrape, corn, or callus. You have a fever. You have a red  line going up your leg. Summary Check your feet every day for blisters, cuts, bruises, sores, and redness. Apply a moisturizing lotion or petroleum jelly to the skin on your feet and to dry, brittle toenails. Wear shoes that fit properly and have enough cushioning. If you have foot problems, report any cuts, sores, or bruises to your health care provider immediately. Schedule a complete foot exam at least once a year (annually) or more often if you have foot problems. This information is not intended to replace advice given to you by your health care provider. Make sure you discuss any questions you have with your health care provider. Document Revised: 05/27/2020 Document Reviewed: 05/27/2020 Elsevier Patient Education  2023 Elsevier Inc.  

## 2022-04-07 ENCOUNTER — Encounter: Payer: Self-pay | Admitting: Orthopaedic Surgery

## 2022-04-07 ENCOUNTER — Ambulatory Visit: Payer: 59 | Admitting: Orthopaedic Surgery

## 2022-04-07 DIAGNOSIS — M25462 Effusion, left knee: Secondary | ICD-10-CM | POA: Insufficient documentation

## 2022-04-07 DIAGNOSIS — M1712 Unilateral primary osteoarthritis, left knee: Secondary | ICD-10-CM | POA: Diagnosis not present

## 2022-04-07 MED ORDER — LIDOCAINE HCL 1 % IJ SOLN
2.0000 mL | INTRAMUSCULAR | Status: AC | PRN
  Administered 2022-04-07: 2 mL

## 2022-04-07 MED ORDER — BUPIVACAINE HCL 0.5 % IJ SOLN
2.0000 mL | INTRAMUSCULAR | Status: AC | PRN
  Administered 2022-04-07: 2 mL via INTRA_ARTICULAR

## 2022-04-07 NOTE — Progress Notes (Signed)
Office Visit Note   Patient: [name] [name]           Date of Birth: 05-Nov-1956           MRN: [id number] Visit Date: 04/07/2022              Requested by: Antony Contras, MD [place] [place],  [place] [address] PCP: Antony Contras, MD   Assessment & Plan: Visit Diagnoses:  1. Primary osteoarthritis of left knee   2. Effusion, left knee     Plan: Impression is left knee end-stage DJD.  I attempted to aspirate the effusion but was only able to get a scant amount of bloody fluid which was not enough to culture sent for cell count.  I would like to send him to [facility] to have this evaluated and possibly aspirated under ultrasound guidance.  Cortisone should not be injected in his knee until we can confirm that there is no infection.  I am very happy that his inflammatory markers have all returned back to normal.  Follow-Up Instructions: No follow-ups on file.   Orders:  No orders of the defined types were placed in this encounter.  No orders of the defined types were placed in this encounter.     Procedures: Large Joint Inj: L knee on 04/07/2022 9:24 AM Details: 22 G needle Medications: 2 mL bupivacaine 0.5 %; 2 mL lidocaine 1 % Outcome: tolerated well, no immediate complications Patient was prepped and draped in the usual sterile fashion.      Clinical Data: No additional findings.   Subjective: Chief Complaint  Patient presents with   Left Knee - Follow-up    HPI [name] [name] follows up today for his 52-month visit for his left knee.  Overall his knee feels better with some pain in the morning.  Currently takes Tylenol and Aleve.  Recently saw infectious disease and his inflammatory markers have all normalized.  He has been off of antibiotics for at least a couple months. Review of Systems  Constitutional: Negative.   All other systems reviewed and are negative.   Objective: Vital Signs: There were no vitals taken for this  visit.  Physical Exam Vitals and nursing note reviewed.  Constitutional:      Appearance: He is well-developed.  Pulmonary:     Effort: Pulmonary effort is normal.  Abdominal:     Palpations: Abdomen is soft.  Skin:    General: Skin is warm.  Neurological:     Mental Status: He is alert and oriented to person, place, and time.  Psychiatric:        Behavior: Behavior normal.        Thought Content: Thought content normal.        Judgment: Judgment normal.    Ortho Exam Examination of the left knee shows swelling and effusion.  Valgus alignment.  Fully healed surgical scars.  No evidence of warmth or infection. Specialty Comments:  No specialty comments available.  Imaging: No results found.   PMFS History: Patient Active Problem List   Diagnosis Date Noted   Primary osteoarthritis of left knee 04/07/2022   Effusion, left knee 04/07/2022   Septic arthritis of knee, left ([place]) 11/11/2021   Mass of lower leg, left 11/02/2021   Amputation of right great toe ([place]) 10/31/2021   Anxiety 10/31/2021   Chronic kidney disease, stage 3b ([place]) 10/31/2021   Diabetic renal disease ([place]) 10/31/2021   Gastroesophageal reflux disease without esophagitis  10/31/2021   History of amputation of great toe ([place]) 10/31/2021   Hyperglycemia due to type 2 diabetes mellitus ([place]) 10/31/2021   Leukocytosis 10/31/2021   Long term (current) use of insulin ([place]) 10/31/2021   Malignant hypertensive chronic kidney disease 10/31/2021   Male hypogonadism 10/31/2021   Mass of left adrenal gland ([place]) 10/31/2021   Obstructive sleep apnea syndrome 10/31/2021   Other specified disorders of bladder 10/31/2021   Polyneuropathy due to type 2 diabetes mellitus ([place]) 10/31/2021   Baker's cyst, left 10/06/2021   Knee effusion, left 10/06/2021   Cyanosis 10/06/2021   Hyponatremia    AKI (acute kidney injury) ([place])    Elevated LFTs    Post-operative state    Cellulitis of left lower extremity     Streptococcal bacteremia    Cellulitis of right lower extremity    Osteomyelitis of great toe of right foot (HCC)    Osteomyelitis of second toe of right foot ([place])    Diabetes mellitus due to underlying condition with diabetic autonomic neuropathy, without long-term current use of insulin (HCC)    Class 3 severe obesity with serious comorbidity and body mass index (BMI) of 45.0 to 49.9 in adult ([place])    Toe infection 08/08/2021   CAD (coronary artery disease) of artery bypass graft 04/21/2015   Hyperlipidemia 04/21/2015   Essential hypertension 04/21/2015   Type 2 diabetes mellitus ([place]) 04/21/2015   Ulcer of foot ([place]) 08/22/2014   Past Medical History:  Diagnosis Date   Anxiety    Baker's cyst 10/06/2021   Bulbous urethral stricture    chronic--- hx multiple dilatation's and urolume stent's   CAD (coronary artery disease) 04/ 2011  (previous cardiologist-  dr h. Tamala Julian , lov 06/ 2016)  currently followed by pcp   03-03-2010  CABG x 5, LIMA to LAD, left radial artery to OM1,SVG to R1, seqSVG to PDA and PLA   Cyanosis 10/06/2021   Depression    ED (erectile dysfunction)    Essential hypertension, benign    GERD (gastroesophageal reflux disease)    History of pancreatitis 2005   Knee effusion 10/06/2021   Mixed hyperlipidemia    OSA on CPAP    not used recently   S/P CABG x 5 03/03/2010   Septic arthritis of knee, left ([place]) 11/11/2021   Type II or unspecified type diabetes mellitus without mention of complication, not stated as uncontrolled    endocrinologist-  dr Buddy Duty    Family History  Problem Relation Age of Onset   Cancer Mother        lung   Hypertension Father    CAD Father    CVA Brother     Past Surgical History:  Procedure Laterality Date   AMPUTATION Right 08/09/2021   Procedure: AMPUTATION DIGIT;  Surgeon: Trula Slade, DPM;  [facility]: [facility];  Service: Podiatry;  Laterality: Right;   CARDIAC CATHETERIZATION  03-01-2010   dr Daneen Schick   nuclear  stress test --positive moderate ishemia:  severe multivessel CAD w/ total occlusion of pLAD, HG obstrucion of pPDA with supplies collaterals around the apex to the LAD, severe stenosis distal RCA, total occlusion OM1, 75% D1, normal LVF    CORONARY ARTERY BYPASS GRAFT  03-03-2010  dr hendrickson    LIMA to LAD, SVG to RI, left radial artery to OM1, seqSVG  to PDA and PLA   CYST EXCISION Left 11/02/2021   Procedure: REMOVAL OF LEFT CALF MASS;  Surgeon: Leandrew Koyanagi, MD;  Location: [place];  Service: Orthopedics;  Laterality: Left;   CYSTO W/ DILATATION , LASER INCISION OF URETHRAL STRICTURE  02-25-2005   dr Karsten Ro  [facility]   CYSTO/  DILATION URETHRAL MEATAL STENOSIS AND DILATION BULBOUS STRICTURE  12-21-2004   dr Terance Hart  [facility]   CYSTO/ RETROGRADE PYELOGRAM/ DILATATION URETHRAL STRICTURE AND PLACEMENT UROLUME STENT  08-04-2011    dr Karsten Ro  [facility]   CYSTOSCOPY WITH DIRECT VISION INTERNAL URETHROTOMY  07/15/2008   dr Karsten Ro  [facility]   and UroLume Stent placement   CYSTOSCOPY WITH URETHRAL DILATATION N/A 04/19/2018   Procedure: CYSTOSCOPY WITH BALLOON URETHRAL DILATATION;  Surgeon: Kathie Rhodes, MD;  Location[facility];  Service: Urology;  Laterality: N/A;   INCISION AND DRAINAGE Right 08/13/2021   Procedure: INCISION AND DRAINAGE RIGHT FOOT WOUND;  Surgeon: Trula Slade, DPM;  [facility];  Service: Podiatry;  Laterality: Right;   KNEE ARTHROSCOPY Left 11/15/2021   Procedure: LEFT KNEE ARTHROSCOPIC IRRIGATION AND DEBRIDEMENT;  Surgeon: Leandrew Koyanagi, MD;  Location: [place];  Service: Orthopedics;  Laterality: Left;   ORIF TOE FRACTURE Left 05/22/2013   Procedure: OPEN REDUCTION INTERNAL FIXATION (ORIF) LEFT SECOND TOE PROXIMAL PHALANX FRACTURE/LEFT PERCUTANEOUS PINNING;  Surgeon: Wylene Simmer, MD;  Location: [place];  Service: Orthopedics;  Laterality: Left;   TONSILLECTOMY  child   Social History   Occupational History   Not on file  Tobacco Use   Smoking  status: Never   Smokeless tobacco: Never  Vaping Use   Vaping Use: Never used  Substance and Sexual Activity   Alcohol use: Yes    Comment: occasional   Drug use: No   Sexual activity: Not Currently

## 2022-04-08 NOTE — Progress Notes (Signed)
Subjective: [name] [name] is a 66 y.o. is seen today for diabetic foot exam as well as for preulcerative lesion left big toe.  Amputations on the right foot he has been doing well he has no issues.  He is wearing regular shoes and certainly increase activity level as tolerated.  He has no fevers or chills or any open sores that he reports.  No other concerns.   Objective: General: No acute distress, AAOx3  DP/PT pulses palpable 2/4, CRT < 3 sec to all digits.  Right foot: Incisions in the prior surgery are well-healed on the right foot.  Hyperkeratotic lesion right third toe without any underlying ulceration or signs of infection. Left foot: Preulcerative lesion on the plantar aspect of the hallux.  Hyperkeratotic tissue with some dried blood but upon debridement there is no underlying ulceration drainage or any signs of infection. In general nails are hypertrophic, dystrophic with yellow, brown discoloration to nails 1 through 5 on the left and 3 through 5 on the right.  No edema, erythema or signs of infection. No pain with calf compression, swelling, warmth, erythema.    Assessment and Plan:  Symptomatic onychosis, hyperkeratotic lesions  -Treatment options discussed including all alternatives, risks, and complications -X-rays obtained and reviewed of the right foot.  3 views of the right foot were obtained.  No evidence of acute fracture, osteomyelitis. -Sharply debrided nails x8 without any complications or bleeding -Debrided hyperkeratotic lesions that are preulcerative x2 without any complications or bleeding -Discussed daily foot inspection encouraged glucose control. -Follow-up today with [name] [name] for new diabetic shoes.  However this will help further offload.  Return in about 9 weeks (around 06/06/2022).  [name] [name] DPM

## 2022-04-11 NOTE — Progress Notes (Unsigned)
    Subjective:    CC: L knee pain  I, Molly Weber, LAT, ATC, am serving as scribe for Dr. Lynne Leader.  HPI: Pt is a 66 y/o male presenting w/ c/o L knee pain and swelling due to DJD/OA.  He also recently had a L knee I&D likely from seeded strep intermedius bacteremia per Dr. Phoebe Sharps note on 02/14/22.  He was most recently seen by Dr. Erlinda Hong on 04/07/22 and a L knee aspiration was attempted w/ only scant fluid removed.  Today, pt reports   L knee swelling: L knee mechanical symptoms: Aggravating factors: Treatments tried:   Diagnostic testing: L knee MRI- 09/27/21; L tib/fib MRI- 09/17/21; L knee XR- 09/13/21  Pertinent review of Systems: ***  Relevant historical information: ***   Objective:   There were no vitals filed for this visit. General: Well Developed, well nourished, and in no acute distress.   MSK: ***  Lab and Radiology Results No results found for this or any previous visit (from the past 72 hour(s)). No results found.    Impression and Recommendations:    Assessment and Plan: 66 y.o. male with ***.  PDMP not reviewed this encounter. No orders of the defined types were placed in this encounter.  No orders of the defined types were placed in this encounter.   Discussed warning signs or symptoms. Please see discharge instructions. Patient expresses understanding.   ***

## 2022-04-12 ENCOUNTER — Ambulatory Visit: Payer: Self-pay

## 2022-04-12 ENCOUNTER — Ambulatory Visit: Payer: 59 | Admitting: Family Medicine

## 2022-04-12 ENCOUNTER — Encounter: Payer: Self-pay | Admitting: Orthopaedic Surgery

## 2022-04-12 ENCOUNTER — Encounter: Payer: Self-pay | Admitting: Family Medicine

## 2022-04-12 VITALS — BP 130/80 | HR 54 | Ht 72.0 in | Wt 258.8 lb

## 2022-04-12 DIAGNOSIS — M25562 Pain in left knee: Secondary | ICD-10-CM

## 2022-04-12 DIAGNOSIS — M1712 Unilateral primary osteoarthritis, left knee: Secondary | ICD-10-CM

## 2022-04-12 DIAGNOSIS — G8929 Other chronic pain: Secondary | ICD-10-CM

## 2022-04-12 NOTE — Patient Instructions (Addendum)
Nice to meet you today.  I will talk with Dr Roda Shutters about next steps.   I think a good idea is to deliberately overuse your knee and generate a knee effusion and come in that day for a repeat attempt.   Follow-up: as needed.

## 2022-04-13 ENCOUNTER — Other Ambulatory Visit: Payer: Self-pay | Admitting: Physician Assistant

## 2022-04-13 MED ORDER — HYDROCODONE-ACETAMINOPHEN 5-325 MG PO TABS: 1.0000 | ORAL_TABLET | Freq: Every day | ORAL | 0 refills | Status: DC | PRN

## 2022-04-13 NOTE — Telephone Encounter (Signed)
sent 

## 2022-04-28 ENCOUNTER — Encounter: Payer: Self-pay | Admitting: Family Medicine

## 2022-05-31 ENCOUNTER — Telehealth: Payer: Self-pay | Admitting: Orthopaedic Surgery

## 2022-05-31 NOTE — Telephone Encounter (Signed)
Pt left a mychart message to set an consultation appt for knee replacement surgery and pt did not answer. Left a vm for pt to call and set ann with Dr. Roda Shutters for consultation.

## 2022-06-06 ENCOUNTER — Ambulatory Visit (INDEPENDENT_AMBULATORY_CARE_PROVIDER_SITE_OTHER): Payer: 59

## 2022-06-06 ENCOUNTER — Ambulatory Visit (INDEPENDENT_AMBULATORY_CARE_PROVIDER_SITE_OTHER): Payer: 59 | Admitting: Podiatry

## 2022-06-06 DIAGNOSIS — L84 Corns and callosities: Secondary | ICD-10-CM | POA: Diagnosis not present

## 2022-06-06 DIAGNOSIS — E1149 Type 2 diabetes mellitus with other diabetic neurological complication: Secondary | ICD-10-CM | POA: Diagnosis not present

## 2022-06-06 DIAGNOSIS — B351 Tinea unguium: Secondary | ICD-10-CM | POA: Diagnosis not present

## 2022-06-06 DIAGNOSIS — M79675 Pain in left toe(s): Secondary | ICD-10-CM

## 2022-06-06 DIAGNOSIS — M79674 Pain in right toe(s): Secondary | ICD-10-CM

## 2022-06-06 DIAGNOSIS — Z9889 Other specified postprocedural states: Secondary | ICD-10-CM

## 2022-06-06 NOTE — Progress Notes (Signed)
Subjective: [name] [name] is a 66 y.o. is seen today for diabetic foot exam as well as for preulcerative lesions.  States he been doing well.  No new ulcerations reports.  No present swelling or redness.  Dressing and a bandage on to help intact.  He does go without shoes at home somewhat she can feel the callus on the ball of his feet. Nails are also elongated causing irritation and not able to trim them himself.  He is hopefully going to be scheduled for left knee surgery.   Objective: General: No acute distress, AAOx3  DP/PT pulses palpable 2/4, CRT < 3 sec to all digits. Right foot incision well-healed. Lesions present right submetatarsal 2, left plantar hallux, right and left third digits.  No ongoing ulceration drainage or signs of infecction. Nails are mildly hypertrophic, dystrophic, brittle, elongated  with yellow discoloration 10. No surrounding redness or drainage. Tenderness nails 1-5 on the left and 3-5 on the right.  No open lesions or pre-ulcerative lesions are identified today.  No pain with calf compression, swelling, warmth, erythema.    Assessment and Plan:  Symptomatic onychosis, hyperkeratotic lesions  -Treatment options discussed including all alternatives, risks, and complications -X-rays were obtained and reviewed with the patient. 3 views of the right foot were obtained. Previous partial 2nd toe amputation and of the first ray. No evidence of acute fracture.  -Sharply debrided nails x 8 without any complications or bleeding -Debrided hyperkeratotic lesions that are preulcerative x4 without any complications or bleeding -Discussed daily foot inspection encouraged glucose control. -Continue inserts for offloading. Also discussed wearing shoes around the house and not going barefoot. He needs to watch his feet closely for any further skin breakdown.   Return in about 9 weeks (around 08/08/2022).  Vivi Barrack DPM

## 2022-06-07 ENCOUNTER — Encounter: Payer: Self-pay | Admitting: Orthopaedic Surgery

## 2022-06-07 ENCOUNTER — Ambulatory Visit (INDEPENDENT_AMBULATORY_CARE_PROVIDER_SITE_OTHER): Payer: 59

## 2022-06-07 ENCOUNTER — Ambulatory Visit (INDEPENDENT_AMBULATORY_CARE_PROVIDER_SITE_OTHER): Payer: 59 | Admitting: Orthopaedic Surgery

## 2022-06-07 VITALS — Ht 72.0 in | Wt 255.0 lb

## 2022-06-07 DIAGNOSIS — M1712 Unilateral primary osteoarthritis, left knee: Secondary | ICD-10-CM

## 2022-06-07 NOTE — Progress Notes (Signed)
Office Visit Note   Patient: [name] [name]           Date of Birth: Apr 10, 1956           MRN: [id number] Visit Date: 06/07/2022              Requested by: Antony Contras, MD [place],  [place] [id number] PCP: Antony Contras, MD   Assessment & Plan: Visit Diagnoses:  1. Primary osteoarthritis of left knee     Plan: At this point child has had a prolonged antibiotic holiday and he has displayed no signs of infection.  He has undergone extensive conservative management for his left knee and at this point he has elected to move forward with a left total knee replacement.  Risk benefits prognosis reviewed in detail.  Denies allergy to nickel.  Denies history of DVT.  Just to be certain we will still draw inflammatory markers today as well as a prealbumin and A1c.  We will get necessary preoperative clearance from his endocrinologist and PCP prior to scheduling surgery.  Questions encouraged and answered.  [name] will be in touch with them to schedule surgery.  Total face to face encounter time was greater than 25 minutes and over half of this time was spent in counseling and/or coordination of care.  Follow-Up Instructions: No follow-ups on file.   Orders:  Orders Placed This Encounter  Procedures   XR KNEE 3 VIEW LEFT   C-reactive protein   Sed Rate (ESR)   CBC with Differential   Hemoglobin A1C   Prealbumin   No orders of the defined types were placed in this encounter.     Procedures: No procedures performed   Clinical Data: No additional findings.   Subjective: Chief Complaint  Patient presents with   Left Knee - Pain    HPI [name] [name] is a very pleasant 66 year old gentleman who is well-known to me.  He comes in for follow-up of advanced left knee DJD.  He would like to look into doing a knee replacement.  His foot wounds have all healed.  He has been off of all antibiotics for several months now.  His diabetes is under good control.  He has  severe pain in his left knee that is interfering with daily activities and quality of life.  Review of Systems  Constitutional: Negative.   All other systems reviewed and are negative.    Objective: Vital Signs: Ht 6' (1.829 m)   Wt 255 lb (115.7 kg)   BMI 34.58 kg/m   Physical Exam Vitals and nursing note reviewed.  Constitutional:      Appearance: He is well-developed.  HENT:     Head: Normocephalic and atraumatic.  Eyes:     Pupils: Pupils are equal, round, and reactive to light.  Pulmonary:     Effort: Pulmonary effort is normal.  Abdominal:     Palpations: Abdomen is soft.  Musculoskeletal:        General: Normal range of motion.     Cervical back: Neck supple.  Skin:    General: Skin is warm.  Neurological:     Mental Status: He is alert and oriented to person, place, and time.  Psychiatric:        Behavior: Behavior normal.        Thought Content: Thought content normal.        Judgment: Judgment normal.     Ortho Exam Examination of the left knee shows  mild generalized swelling.  No joint effusion.  No warmth or cellulitis to indicate infection.  Pain and crepitus with range of motion consistent with DJD.  Collaterals and cruciates are grossly intact. Examination of his feet show fully healed surgical scars and wounds.  No evidence of infection. Specialty Comments:  No specialty comments available.  Imaging: XR KNEE 3 VIEW LEFT  Result Date: 06/07/2022 Advanced tricompartmental degenerative joint disease.  Bone-on-bone joint space narrowing of the medial compartment.      PMFS History: Patient Active Problem List   Diagnosis Date Noted   Primary osteoarthritis of left knee 04/07/2022   Effusion, left knee 04/07/2022   Septic arthritis of knee, left ([place]) 11/11/2021   Mass of lower leg, left 11/02/2021   Amputation of right great toe ([place]) 10/31/2021   Anxiety 10/31/2021   Chronic kidney disease, stage 3b ([place]) 10/31/2021   Diabetic renal disease  ([place]) 10/31/2021   Gastroesophageal reflux disease without esophagitis 10/31/2021   History of amputation of great toe ([place]) 10/31/2021   Hyperglycemia due to type 2 diabetes mellitus ([place]) 10/31/2021   Leukocytosis 10/31/2021   Long term (current) use of insulin ([place]) 10/31/2021   Malignant hypertensive chronic kidney disease 10/31/2021   Male hypogonadism 10/31/2021   Mass of left adrenal gland ([place]) 10/31/2021   Obstructive sleep apnea syndrome 10/31/2021   Other specified disorders of bladder 10/31/2021   Polyneuropathy due to type 2 diabetes mellitus ([place]) 10/31/2021   Baker's cyst, left 10/06/2021   Knee effusion, left 10/06/2021   Cyanosis 10/06/2021   Hyponatremia    AKI (acute kidney injury) ([place])    Elevated LFTs    Post-operative state    Cellulitis of left lower extremity    Streptococcal bacteremia    Cellulitis of right lower extremity    Osteomyelitis of great toe of right foot ([facility])    Osteomyelitis of second toe of right foot ([place])    Diabetes mellitus due to underlying condition with diabetic autonomic neuropathy, without long-term current use of insulin ([facility])    Class 3 severe obesity with serious comorbidity and body mass index (BMI) of 45.0 to 49.9 in adult ([place])    Toe infection 08/08/2021   CAD (coronary artery disease) of artery bypass graft 04/21/2015   Hyperlipidemia 04/21/2015   Essential hypertension 04/21/2015   Type 2 diabetes mellitus ([place]) 04/21/2015   Ulcer of foot ([place]) 08/22/2014   Past Medical History:  Diagnosis Date   Anxiety    Baker's cyst 10/06/2021   Bulbous urethral stricture    chronic--- hx multiple dilatation's and urolume stent's   CAD (coronary artery disease) 04/ 2011  (previous cardiologist-  dr h. Tamala Julian , lov 06/ 2016)  currently followed by pcp   03-03-2010  CABG x 5, LIMA to LAD, left radial artery to OM1,SVG to R1, seqSVG to PDA and PLA   Cyanosis 10/06/2021   Depression    ED (erectile dysfunction)    Essential  hypertension, benign    GERD (gastroesophageal reflux disease)    History of pancreatitis 2005   Knee effusion 10/06/2021   Mixed hyperlipidemia    OSA on CPAP    not used recently   S/P CABG x 5 03/03/2010   Septic arthritis of knee, left ([place]) 11/11/2021   Type II or unspecified type diabetes mellitus without mention of complication, not stated as uncontrolled    endocrinologist-  dr Buddy Duty    Family History  Problem Relation Age of Onset   Cancer Mother  lung   Hypertension Father    CAD Father    CVA Brother     Past Surgical History:  Procedure Laterality Date   AMPUTATION Right 08/09/2021   Procedure: AMPUTATION DIGIT;  Surgeon: Trula Slade, DPM;  Location: [facility];  Service: Podiatry;  Laterality: Right;   CARDIAC CATHETERIZATION  03-01-2010   dr Daneen Schick   nuclear stress test --positive moderate ishemia:  severe multivessel CAD w/ total occlusion of pLAD, HG obstrucion of pPDA with supplies collaterals around the apex to the LAD, severe stenosis distal RCA, total occlusion OM1, 75% D1, normal LVF    CORONARY ARTERY BYPASS GRAFT  03-03-2010  dr hendrickson    LIMA to LAD, SVG to RI, left radial artery to OM1, seqSVG  to PDA and PLA   CYST EXCISION Left 11/02/2021   Procedure: REMOVAL OF LEFT CALF MASS;  Surgeon: Leandrew Koyanagi, MD;  Location: [facility];  Service: Orthopedics;  Laterality: Left;   CYSTO W/ DILATATION , LASER INCISION OF URETHRAL STRICTURE  02-25-2005   dr Karsten Ro  [facility]   CYSTO/  DILATION URETHRAL MEATAL STENOSIS AND DILATION BULBOUS STRICTURE  12-21-2004   dr Terance Hart  [facility]   CYSTO/ RETROGRADE PYELOGRAM/ DILATATION URETHRAL STRICTURE AND PLACEMENT UROLUME STENT  08-04-2011    dr Karsten Ro  [facility]   CYSTOSCOPY WITH DIRECT VISION INTERNAL URETHROTOMY  07/15/2008   dr Karsten Ro  [facility]   and UroLume Stent placement   CYSTOSCOPY WITH URETHRAL DILATATION N/A 04/19/2018   Procedure: CYSTOSCOPY WITH BALLOON URETHRAL DILATATION;  Surgeon: Kathie Rhodes, MD;  Location: [facility];  Service: Urology;  Laterality: N/A;   INCISION AND DRAINAGE Right 08/13/2021   Procedure: INCISION AND DRAINAGE RIGHT FOOT WOUND;  Surgeon: Trula Slade, DPM;  Location: [facility];  Service: Podiatry;  Laterality: Right;   KNEE ARTHROSCOPY Left 11/15/2021   Procedure: LEFT KNEE ARTHROSCOPIC IRRIGATION AND DEBRIDEMENT;  Surgeon: Leandrew Koyanagi, MD;  Location: [facility];  Service: Orthopedics;  Laterality: Left;   ORIF TOE FRACTURE Left 05/22/2013   Procedure: OPEN REDUCTION INTERNAL FIXATION (ORIF) LEFT SECOND TOE PROXIMAL PHALANX FRACTURE/LEFT PERCUTANEOUS PINNING;  Surgeon: Wylene Simmer, MD;  Location: [facility];  Service: Orthopedics;  Laterality: Left;   TONSILLECTOMY  child   Social History   Occupational History   Not on file  Tobacco Use   Smoking status: Never   Smokeless tobacco: Never  Vaping Use   Vaping Use: Never used  Substance and Sexual Activity   Alcohol use: Yes    Comment: occasional   Drug use: No   Sexual activity: Not Currently

## 2022-06-08 LAB — CBC WITH DIFFERENTIAL/PLATELET
Absolute Monocytes: 552 cells/uL (ref 200–950)
Basophils Absolute: 48 cells/uL (ref 0–200)
Basophils Relative: 0.7 %
Eosinophils Absolute: 338 cells/uL (ref 15–500)
Eosinophils Relative: 4.9 %
HCT: 39.8 % (ref 38.5–50.0)
Hemoglobin: 12.5 g/dL — ABNORMAL LOW (ref 13.2–17.1)
Lymphs Abs: 1511 cells/uL (ref 850–3900)
MCH: 26.8 pg — ABNORMAL LOW (ref 27.0–33.0)
MCHC: 31.4 g/dL — ABNORMAL LOW (ref 32.0–36.0)
MCV: 85.4 fL (ref 80.0–100.0)
MPV: 10.4 fL (ref 7.5–12.5)
Monocytes Relative: 8 %
Neutro Abs: 4451 cells/uL (ref 1500–7800)
Neutrophils Relative %: 64.5 %
Platelets: 247 10*3/uL (ref 140–400)
RBC: 4.66 10*6/uL (ref 4.20–5.80)
RDW: 15.3 % — ABNORMAL HIGH (ref 11.0–15.0)
Total Lymphocyte: 21.9 %
WBC: 6.9 10*3/uL (ref 3.8–10.8)

## 2022-06-08 LAB — HEMOGLOBIN A1C
Hgb A1c MFr Bld: 6.2 % of total Hgb — ABNORMAL HIGH (ref <5.7)
Mean Plasma Glucose: 131 mg/dL
eAG (mmol/L): 7.3 mmol/L

## 2022-06-08 LAB — C-REACTIVE PROTEIN: CRP: 1.8 mg/L (ref <8.0)

## 2022-06-08 LAB — PREALBUMIN: Prealbumin: 21 mg/dL (ref 21–43)

## 2022-06-08 LAB — SEDIMENTATION RATE: Sed Rate: 2 mm/h (ref 0–20)

## 2022-06-21 ENCOUNTER — Telehealth: Payer: Self-pay | Admitting: Orthopaedic Surgery

## 2022-06-21 NOTE — Telephone Encounter (Signed)
A surgical clearance request for left total knee arthroplasty was sent out to patient's PCP and endocrinologist.  The PCP-Dr. Tally Joe has cleared the patient from a medical standpoint, but has indicated on the clearance form patient will need cardiac clearance from Dr. Verdis Prime.  A clearance request was faxed over to the Cardiologist yesterday.  Garner Gavel., with [facility] called and left message on voicemail today stating patient will need a referral since 2016 was the last time patient was seen.  Called Dr. Merita Norton office today to ask if referral was made.  The provider is not in the office today, but will be tomorrow.  Message was taken and will be given to the Dr. Azucena Cecil by office rep.  Called patient to inform.  Patient will call Dr. Merita Norton office to follow up on referral.  Patient has my name and direct number should he need any assistance with this process.

## 2022-06-22 ENCOUNTER — Telehealth: Payer: Self-pay | Admitting: *Deleted

## 2022-06-22 NOTE — Telephone Encounter (Signed)
Left message for [facility] to please fax over a clearance request. We received the referral, though I reviewed and I did not see anything for a surgery clearance.   Please fax clearance request to fax #[phone]  I will hand off the referral to [facility] J. CMA

## 2022-06-30 ENCOUNTER — Telehealth: Payer: Self-pay

## 2022-06-30 NOTE — Telephone Encounter (Signed)
   Pre-operative Risk Assessment    Patient Name: [name] [name]  DOB: [birth year] MRN: [id number]      Request for Surgical Clearance    Procedure:   LEFT TOTAL KNEE ARTHOPLASTY  Date of Surgery:  Clearance TBD                                 Surgeon:  Cheral Almas, MD Surgeon's Group or Practice Name:  [facility] Phone number:  [phone] Fax number:  [phone]   Type of Clearance Requested:   - Medical  - Pharmacy:  Hold Aspirin 325 MG   Type of Anesthesia:  Spinal   Additional requests/questions:    SignedMichaelle Copas   06/30/2022, 2:42 PM

## 2022-07-03 NOTE — Telephone Encounter (Signed)
Patient hasn't been seen by our office since 2016. He has a New Patient visit with Dr. Katrinka Blazing scheduled for 07/26/2022 for pre-op evaluation. I will route this clearance form to Dr. Katrinka Blazing so that he is aware.  Pre-op covering staff, can you please let surgeon's office know of need for this appointment?  I will remove from pre-op pool.  Thank you!

## 2022-07-04 NOTE — Telephone Encounter (Signed)
Notes sent as FYI to requesting office the pt has new pt appt with Dr. Katrinka Blazing 07/26/22.

## 2022-07-06 ENCOUNTER — Other Ambulatory Visit: Payer: Self-pay

## 2022-07-25 NOTE — Progress Notes (Unsigned)
Cardiology Office Note:    Date:  07/26/2022   ID:  [name], DOB February 21, 1956, MRN [id number]  PCP:  Tally Joe, MD  Cardiologist:  Lesleigh Noe, MD   Referring MD: Tally Joe, MD   Chief Complaint  Patient presents with   Coronary Artery Disease         History of Present Illness:    [name] [name] is a 66 y.o. male with a hx of coronary artery disease with CABG 2011, hyperlipidemia, obesity, obstructive sleep apnea, partial right second toe amputation, and primary hypertension  Here for pre-op clearance for left knee arthroplasty.  He denies any cardiopulmonary complaints.  He has sepsis related to osteomyelitis but had no cardiac complications or problems.  Denies orthopnea, PND, and chest pain.  Very limited in mobility due to loss of right great toe due to osteomyelitis and septic arthritis of the left knee.  Has upcoming left knee arthroplasty.  Past Medical History:  Diagnosis Date   Anxiety    Baker's cyst 10/06/2021   Bulbous urethral stricture    chronic--- hx multiple dilatation's and urolume stent's   CAD (coronary artery disease) 04/ 2011  (previous cardiologist-  dr h. Katrinka Blazing , lov 06/ 2016)  currently followed by pcp   03-03-2010  CABG x 5, LIMA to LAD, left radial artery to OM1,SVG to R1, seqSVG to PDA and PLA   Cyanosis 10/06/2021   Depression    ED (erectile dysfunction)    Essential hypertension, benign    GERD (gastroesophageal reflux disease)    History of pancreatitis 2005   Knee effusion 10/06/2021   Mixed hyperlipidemia    OSA on CPAP    not used recently   S/P CABG x 5 03/03/2010   Septic arthritis of knee, left (HCC) 11/11/2021   Type II or unspecified type diabetes mellitus without mention of complication, not stated as uncontrolled    endocrinologist-  dr Sharl Ma    Past Surgical History:  Procedure Laterality Date   AMPUTATION Right 08/09/2021   Procedure: AMPUTATION DIGIT;  Surgeon: Vivi Barrack, DPM;  Location: [facility];   Service: Podiatry;  Laterality: Right;   CARDIAC CATHETERIZATION  03-01-2010   dr Verdis Prime   nuclear stress test --positive moderate ishemia:  severe multivessel CAD w/ total occlusion of pLAD, HG obstrucion of pPDA with supplies collaterals around the apex to the LAD, severe stenosis distal RCA, total occlusion OM1, 75% D1, normal LVF    CORONARY ARTERY BYPASS GRAFT  03-03-2010  dr hendrickson    LIMA to LAD, SVG to RI, left radial artery to OM1, seqSVG  to PDA and PLA   CYST EXCISION Left 11/02/2021   Procedure: REMOVAL OF LEFT CALF MASS;  Surgeon: Tarry Kos, MD;  Location: [facility];  Service: Orthopedics;  Laterality: Left;   CYSTO W/ DILATATION , LASER INCISION OF URETHRAL STRICTURE  02-25-2005   dr Vernie Ammons  [facility]   CYSTO/  DILATION URETHRAL MEATAL STENOSIS AND DILATION BULBOUS STRICTURE  12-21-2004   dr Vonita Moss  [facility]   CYSTO/ RETROGRADE PYELOGRAM/ DILATATION URETHRAL STRICTURE AND PLACEMENT UROLUME STENT  08-04-2011    dr Vernie Ammons  [facility]   CYSTOSCOPY WITH DIRECT VISION INTERNAL URETHROTOMY  07/15/2008   dr Vernie Ammons  [facility]   and UroLume Stent placement   CYSTOSCOPY WITH URETHRAL DILATATION N/A 04/19/2018   Procedure: CYSTOSCOPY WITH BALLOON URETHRAL DILATATION;  Surgeon: Ihor Gully, MD;  Location: [facility];  Service: Urology;  Laterality: N/A;   INCISION AND DRAINAGE Right 08/13/2021   Procedure: INCISION AND DRAINAGE RIGHT FOOT WOUND;  Surgeon: Trula Slade, DPM;  Location: [facility];  Service: Podiatry;  Laterality: Right;   KNEE ARTHROSCOPY Left 11/15/2021   Procedure: LEFT KNEE ARTHROSCOPIC IRRIGATION AND DEBRIDEMENT;  Surgeon: Leandrew Koyanagi, MD;  Location: [facility];  Service: Orthopedics;  Laterality: Left;   ORIF TOE FRACTURE Left 05/22/2013   Procedure: OPEN REDUCTION INTERNAL FIXATION (ORIF) LEFT SECOND TOE PROXIMAL PHALANX FRACTURE/LEFT PERCUTANEOUS PINNING;  Surgeon: Wylene Simmer, MD;  Location: [facility];  Service: Orthopedics;  Laterality: Left;    TONSILLECTOMY  child    Current Medications: Current Meds  Medication Sig   ALPRAZolam (XANAX) 0.5 MG tablet Take 0.5 mg by mouth 2 (two) times daily as needed for anxiety.   amLODipine (NORVASC) 10 MG tablet Take 10 mg by mouth at bedtime.   aspirin 325 MG tablet Take 325 mg by mouth daily.   atorvastatin (LIPITOR) 80 MG tablet Take 80 mg by mouth every evening.   buPROPion (WELLBUTRIN XL) 300 MG 24 hr tablet Take 300 mg by mouth every morning.    fenofibrate 160 MG tablet Take 160 mg by mouth every evening.   fish oil-omega-3 fatty acids 1000 MG capsule Take 1 g by mouth daily.    HUMALOG MIX 50/50 KWIKPEN (50-50) 100 UNIT/ML Kwikpen Inject 40 Units into the skin 3 (three) times daily before meals.   hydrALAZINE (APRESOLINE) 50 MG tablet Take 1 tablet (50 mg total) by mouth 2 (two) times daily.   metoprolol tartrate (LOPRESSOR) 100 MG tablet Take 100 mg by mouth 2 (two) times daily.   NOVOFINE 32G X 6 MM MISC    olmesartan-hydrochlorothiazide (BENICAR HCT) 40-25 MG per tablet Take 1 tablet by mouth every morning.    omeprazole (PRILOSEC) 20 MG capsule Take 20 mg by mouth every evening.    potassium chloride SA (K-DUR,KLOR-CON) 20 MEQ tablet Take 40 mEq by mouth every morning.     Allergies:   Hydrochlorothiazide and Metformin hcl   Social History   Socioeconomic History   Marital status: Married    Spouse name: Not on file   Number of children: Not on file   Years of education: Not on file   Highest education level: Not on file  Occupational History   Not on file  Tobacco Use   Smoking status: Never   Smokeless tobacco: Never  Vaping Use   Vaping Use: Never used  Substance and Sexual Activity   Alcohol use: Yes    Comment: occasional   Drug use: No   Sexual activity: Not Currently  Other Topics Concern   Not on file  Social History Narrative   Not on file   Social Determinants of Health   Financial Resource Strain: Not on file  Food Insecurity: Not on file   Transportation Needs: Not on file  Physical Activity: Not on file  Stress: Not on file  Social Connections: Not on file     Family History: The patient's family history includes CAD in his father; CVA in his brother; Cancer in his mother; Hypertension in his father.  ROS:   Please see the history of present illness.    No angina, orthopnea, PND, chills, or fever.  All other systems reviewed and are negative.  EKGs/Labs/Other Studies Reviewed:    The following studies were reviewed today:  Lower extremity ultrasound arterial segment multiple ABI study March 2023: IMPRESSION: No evidence of significant  bilateral lower extremity peripheral artery disease.   Ruthann Cancer, MD     ECHOCARDIOGRAM 2022: IMPRESSIONS   1. Left ventricular ejection fraction, by estimation, is 55 to 60%. The  left ventricle has normal function. The left ventricle has no regional  wall motion abnormalities. There is mild left ventricular hypertrophy.  Left ventricular diastolic parameters  are consistent with Grade I diastolic dysfunction (impaired relaxation).   2. Right ventricular systolic function is normal. The right ventricular  size is normal. Tricuspid regurgitation signal is inadequate for assessing  PA pressure.   3. The mitral valve is grossly normal. Trivial mitral valve  regurgitation. No evidence of mitral stenosis.   4. The aortic valve is tricuspid. Aortic valve regurgitation is not  visualized. No aortic stenosis is present.   5. The inferior vena cava is normal in size with greater than 50%  respiratory variability, suggesting right atrial pressure of 3 mmHg.   Conclusion(s)/Recommendation(s): No evidence of valvular vegetations on  this transthoracic echocardiogram. Consider a transesophageal  echocardiogram to exclude infective endocarditis if clinically indicated.   EKG:  EKG normal sinus rhythm, nonspecific ST-T wave change, interventricular conduction delay with QRSD 102 ms.   When compared to the prior tracing performed  Recent Labs: 08/11/2021: TSH 0.886 08/20/2021: ALT 27 01/19/2022: BUN 27; Creat 1.93; Potassium 4.9; Sodium 126 06/07/2022: Hemoglobin 12.5; Platelets 247  Recent Lipid Panel No results found for: "CHOL", "TRIG", "HDL", "CHOLHDL", "VLDL", "LDLCALC", "LDLDIRECT"  Physical Exam:    VS:  BP (!) 152/90   Pulse 81   Ht 6' (1.829 m)   Wt 256 lb (116.1 kg)   SpO2 98%   BMI 34.72 kg/m     Wt Readings from Last 3 Encounters:  07/26/22 256 lb (116.1 kg)  06/07/22 255 lb (115.7 kg)  04/12/22 258 lb 12.8 oz (117.4 kg)     GEN: Obese with BMI 35. No acute distress HEENT: Normal NECK: No JVD. LYMPHATICS: No lymphadenopathy CARDIAC: No murmur. RRR no gallop, or edema. VASCULAR:  Normal Pulses. No bruits. RESPIRATORY:  Clear to auscultation without rales, wheezing or rhonchi  ABDOMEN: Soft, non-tender, non-distended, No pulsatile mass, MUSCULOSKELETAL: No deformity.  Missing right great toe. SKIN: Warm and dry NEUROLOGIC:  Alert and oriented x 3 PSYCHIATRIC:  Normal affect   ASSESSMENT:    1. Coronary artery disease involving coronary bypass graft of native heart with angina pectoris ([place])   2. Other hyperlipidemia   3. Essential hypertension   4. Type 2 diabetes mellitus with other circulatory complication, unspecified whether long term insulin use ([place])   5. Pre-operative clearance   6. Class 3 severe obesity with serious comorbidity and body mass index (BMI) of 45.0 to 49.9 in adult, unspecified obesity type ([place])    PLAN:    In order of problems listed above:  Lexiscan Myoview to exclude high risk subset.  Anything less than high risk will be cleared to proceed with left knee arthroplasty. Continue statin therapy.  LDL target less than 70. Pressure is repeated and is 152/90 mmHg.  Blood pressure is 130/80 mmHg.  Thought about adding spironolactone however creatinine of 1.6 cause me to have other thoughts. Hemoglobin A1c was 6.2 in  July with Dr. Buddy Duty. He will have a Lexiscan myocardial perfusion study to exclude high risk anatomy/ischemic subset.  Even with abnormality, if there are no high risk findings he would be cleared for his upcoming knee operation. Significant problem made worse by inability to exercise.  Overall education and  awareness concerning secondary risk prevention was discussed in detail: LDL less than 70, hemoglobin A1c less than 7, blood pressure target less than 130/80 mmHg, >150 minutes of moderate aerobic activity per week, avoidance of smoking, weight control (via diet and exercise), and continued surveillance/management of/for obstructive sleep apnea.  1 year cardiology follow-up with new provider.  Medication Adjustments/Labs and Tests Ordered: Current medicines are reviewed at length with the patient today.  Concerns regarding medicines are outlined above.  Orders Placed This Encounter  Procedures   Cardiac Stress Test: Informed Consent Details: Physician/Practitioner Attestation; Transcribe to consent form and obtain patient signature   MYOCARDIAL PERFUSION IMAGING   EKG 12-Lead   Meds ordered this encounter  Medications   hydrALAZINE (APRESOLINE) 50 MG tablet    Sig: Take 1 tablet (50 mg total) by mouth 2 (two) times daily.    Dispense:  180 tablet    Refill:  3    Patient Instructions  Medication Instructions:  Your physician has recommended you make the following change in your medication:   1) START Hydralazine 50mg  twice daily  *If you need a refill on your cardiac medications before your next appointment, please call your pharmacy*  Lab Work: NONE  Testing/Procedures: Your physician has requested that you have a lexiscan myoview. For further information please visit . Please follow instruction sheet, as given. **Checkout: please print patient instructions under letters.**  Follow-Up: At [facility], you and your health needs are our priority.   As part of our continuing mission to provide you with exceptional heart care, we have created designated Provider Care Teams.  These Care Teams include your primary Cardiologist (physician) and Advanced Practice Providers (APPs -  Physician Assistants and Nurse Practitioners) who all work together to provide you with the care you need, when you need it.  Your next appointment:   1 year(s)  The format for your next appointment:   In Person  Provider:   [facility], MD    Important Information About Sugar         Signed, Lesleigh Noe, MD  07/26/2022 10:48 AM    [facility]

## 2022-07-26 ENCOUNTER — Encounter: Payer: Self-pay | Admitting: Interventional Cardiology

## 2022-07-26 ENCOUNTER — Ambulatory Visit: Payer: 59 | Attending: Interventional Cardiology | Admitting: Interventional Cardiology

## 2022-07-26 ENCOUNTER — Telehealth (HOSPITAL_COMMUNITY): Payer: Self-pay | Admitting: *Deleted

## 2022-07-26 VITALS — BP 152/90 | HR 81 | Ht 72.0 in | Wt 256.0 lb

## 2022-07-26 DIAGNOSIS — E1159 Type 2 diabetes mellitus with other circulatory complications: Secondary | ICD-10-CM

## 2022-07-26 DIAGNOSIS — Z6841 Body Mass Index (BMI) 40.0 and over, adult: Secondary | ICD-10-CM

## 2022-07-26 DIAGNOSIS — Z01818 Encounter for other preprocedural examination: Secondary | ICD-10-CM

## 2022-07-26 DIAGNOSIS — I25709 Atherosclerosis of coronary artery bypass graft(s), unspecified, with unspecified angina pectoris: Secondary | ICD-10-CM

## 2022-07-26 DIAGNOSIS — E7849 Other hyperlipidemia: Secondary | ICD-10-CM

## 2022-07-26 DIAGNOSIS — I10 Essential (primary) hypertension: Secondary | ICD-10-CM

## 2022-07-26 MED ORDER — HYDRALAZINE HCL 50 MG PO TABS: 50.0000 mg | ORAL_TABLET | Freq: Two times a day (BID) | ORAL | 3 refills | Status: DC

## 2022-07-26 NOTE — Patient Instructions (Addendum)
Medication Instructions:  Your physician has recommended you make the following change in your medication:   1) START Hydralazine 50mg  twice daily  *If you need a refill on your cardiac medications before your next appointment, please call your pharmacy*  Lab Work: NONE  Testing/Procedures: Your physician has requested that you have a lexiscan myoview. For further information please visit . Please follow instruction sheet, as given. **Checkout: please print patient instructions under letters.**  Follow-Up: At [facility], you and your health needs are our priority.  As part of our continuing mission to provide you with exceptional heart care, we have created designated Provider Care Teams.  These Care Teams include your primary Cardiologist (physician) and Advanced Practice Providers (APPs -  Physician Assistants and Nurse Practitioners) who all work together to provide you with the care you need, when you need it.  Your next appointment:   1 year(s)  The format for your next appointment:   In Person  Provider:   [facility], MD    Important Information About Sugar

## 2022-07-26 NOTE — Telephone Encounter (Signed)
Left message on voicemail per DPR in reference to upcoming appointment scheduled on 07/31/2022 at 10:30 with detailed instructions given per Myocardial Perfusion Study Information Sheet for the test. LM to arrive 15 minutes early, and that it is imperative to arrive on time for appointment to keep from having the test rescheduled. If you need to cancel or reschedule your appointment, please call the office within 24 hours of your appointment. Failure to do so may result in a cancellation of your appointment, and a $50 no show fee. Phone number given for call back for any questions.

## 2022-07-31 ENCOUNTER — Encounter (HOSPITAL_COMMUNITY): Payer: 59

## 2022-08-01 ENCOUNTER — Encounter: Payer: 59 | Admitting: Podiatry

## 2022-08-03 ENCOUNTER — Encounter: Payer: Self-pay | Admitting: Podiatry

## 2022-08-03 NOTE — Progress Notes (Signed)
Surgical Instructions    Your procedure is scheduled on Monday 08/14/22.  Report to [facility] Main Entrance "A" at 9:30 A.M., then check in with the Admitting office.  Call this number if you have problems the morning of surgery:  [phone]   If you have any questions prior to your surgery date call [phone]: Open Monday-Friday 8am-4pm    Remember:  Do not eat after midnight the night before your surgery  You may drink clear liquids until 9:00 the morning of your surgery.   Clear liquids allowed are: Water, Non-Citrus Juices (without pulp), Carbonated Beverages, Clear Tea, Black Coffee ONLY (NO MILK, CREAM OR POWDERED CREAMER of any kind), and Gatorade Patient Instructions  The night before surgery:  No food after midnight. ONLY clear liquids after midnight  The day of surgery (if you have diabetes): Drink ONE (1) 12 oz G2 given to you in your pre admission testing appointment by 9am the morning of surgery. Drink in one sitting. Do not sip.  This drink was given to you during your hospital  pre-op appointment visit.  Nothing else to drink after completing the  12 oz bottle of G2.        If you have questions, please contact your surgeon's office.     Take these medicines the morning of surgery with A SIP OF WATER:  ALPRAZolam Prudy Feeler) if needed buPROPion (WELLBUTRIN XL)  hydrALAZINE (APRESOLINE)  metoprolol tartrate (LOPRESSOR)   As of today, STOP taking any Aspirin (unless otherwise instructed by your surgeon) Aleve, Naproxen, Ibuprofen, Motrin, Advil, Goody's, BC's, all herbal medications, fish oil, and all vitamins.  WHAT DO I DO ABOUT MY DIABETES MEDICATION?   Do not take OZEMPIC the morning of surgery.  THE NIGHT BEFORE SURGERY, take 28 units of HUMALOG MIX 50/50 KWIKPEN        Do not take HUMALOG MIX 50/50 KWIKPEN the morning of surgery.   HOW TO MANAGE YOUR DIABETES BEFORE AND AFTER SURGERY  Why is it important to control my blood sugar before and after  surgery? Improving blood sugar levels before and after surgery helps healing and can limit problems. A way of improving blood sugar control is eating a healthy diet by:  Eating less sugar and carbohydrates  Increasing activity/exercise  Talking with your doctor about reaching your blood sugar goals High blood sugars (greater than 180 mg/dL) can raise your risk of infections and slow your recovery, so you will need to focus on controlling your diabetes during the weeks before surgery. Make sure that the doctor who takes care of your diabetes knows about your planned surgery including the date and location.  How do I manage my blood sugar before surgery? Check your blood sugar at least 4 times a day, starting 2 days before surgery, to make sure that the level is not too high or low.  Check your blood sugar the morning of your surgery when you wake up and every 2 hours until you get to the Short Stay unit.  If your blood sugar is less than 70 mg/dL, you will need to treat for low blood sugar: Do not take insulin. Treat a low blood sugar (less than 70 mg/dL) with  cup of clear juice (cranberry or apple), 4 glucose tablets, OR glucose gel. Recheck blood sugar in 15 minutes after treatment (to make sure it is greater than 70 mg/dL). If your blood sugar is not greater than 70 mg/dL on recheck, call [phone] for further instructions. Report your blood  sugar to the short stay nurse when you get to Short Stay.  If you are admitted to the hospital after surgery: Your blood sugar will be checked by the staff and you will probably be given insulin after surgery (instead of oral diabetes medicines) to make sure you have good blood sugar levels. The goal for blood sugar control after surgery is 80-180 mg/dL.         Do not wear jewelry or makeup. Do not wear lotions, powders, perfumes/cologne or deodorant. Do not shave 48 hours prior to surgery.  Men may shave face and neck. Do not bring valuables to  the hospital. Do not wear nail polish, gel polish, artificial nails, or any other type of covering on natural nails (fingers and toes) If you have artificial nails or gel coating that need to be removed by a nail salon, please have this removed prior to surgery. Artificial nails or gel coating may interfere with anesthesia's ability to adequately monitor your vital signs.  [facility] is not responsible for any belongings or valuables.    Do NOT Smoke (Tobacco/Vaping)  24 hours prior to your procedure  If you use a CPAP at night, you may bring your mask for your overnight stay.   Contacts, glasses, hearing aids, dentures or partials may not be worn into surgery, please bring cases for these belongings   For patients admitted to the hospital, discharge time will be determined by your treatment team.   Patients discharged the day of surgery will not be allowed to drive home, and someone needs to stay with them for 24 hours.   SURGICAL WAITING ROOM VISITATION Patients having surgery or a procedure may have no more than 2 support people in the waiting area - these visitors may rotate.   Children under the age of 39 must have an adult with them who is not the patient. If the patient needs to stay at the hospital during part of their recovery, the visitor guidelines for inpatient rooms apply. Pre-op nurse will coordinate an appropriate time for 1 support person to accompany patient in pre-op.  This support person may not rotate.   Please refer to the [facility] website for the visitor guidelines for Inpatients (after your surgery is over and you are in a regular room).    Special instructions:    Oral Hygiene is also important to reduce your risk of infection.  Remember - BRUSH YOUR TEETH THE MORNING OF SURGERY WITH YOUR REGULAR TOOTHPASTE   - Preparing For Surgery  Before surgery, you can play an important role. Because skin is not sterile, your skin needs to be as free of  germs as possible. You can reduce the number of germs on your skin by washing with CHG (chlorahexidine gluconate) Soap before surgery.  CHG is an antiseptic cleaner which kills germs and bonds with the skin to continue killing germs even after washing.     Please do not use if you have an allergy to CHG or antibacterial soaps. If your skin becomes reddened/irritated stop using the CHG.  Do not shave (including legs and underarms) for at least 48 hours prior to first CHG shower. It is OK to shave your face.  Please follow these instructions carefully.     Shower the NIGHT BEFORE SURGERY and the MORNING OF SURGERY with CHG Soap.   If you chose to wash your hair, wash your hair first as usual with your normal shampoo. After you shampoo, rinse your hair  and body thoroughly to remove the shampoo.  Then [employer] and genitals (private parts) with your normal soap and rinse thoroughly to remove soap.  After that Use CHG Soap as you would any other liquid soap. You can apply CHG directly to the skin and wash gently with a scrungie or a clean washcloth.   Apply the CHG Soap to your body ONLY FROM THE NECK DOWN.  Do not use on open wounds or open sores. Avoid contact with your eyes, ears, mouth and genitals (private parts). Wash Face and genitals (private parts)  with your normal soap.   Wash thoroughly, paying special attention to the area where your surgery will be performed.  Thoroughly rinse your body with warm water from the neck down.  DO NOT shower/wash with your normal soap after using and rinsing off the CHG Soap.  Pat yourself dry with a CLEAN TOWEL.  Wear CLEAN PAJAMAS to bed the night before surgery  Place CLEAN SHEETS on your bed the night before your surgery  DO NOT SLEEP WITH PETS.   Day of Surgery:  Take a shower with CHG soap. Wear Clean/Comfortable clothing the morning of surgery Do not apply any deodorants/lotions.   Remember to brush your teeth WITH YOUR REGULAR  TOOTHPASTE.    If you received a COVID test during your pre-op visit, it is requested that you wear a mask when out in public, stay away from anyone that may not be feeling well, and notify your surgeon if you develop symptoms. If you have been in contact with anyone that has tested positive in the last 10 days, please notify your surgeon.    Please read over the following fact sheets that you were given.

## 2022-08-04 ENCOUNTER — Inpatient Hospital Stay (HOSPITAL_COMMUNITY)
Admission: RE | Admit: 2022-08-04 | Discharge: 2022-08-04 | Disposition: A | Discharge status: Home / Self Care | Payer: 59 | Source: Ambulatory Visit

## 2022-08-04 ENCOUNTER — Ambulatory Visit (INDEPENDENT_AMBULATORY_CARE_PROVIDER_SITE_OTHER): Payer: 59

## 2022-08-04 ENCOUNTER — Ambulatory Visit (INDEPENDENT_AMBULATORY_CARE_PROVIDER_SITE_OTHER): Payer: 59 | Admitting: Podiatry

## 2022-08-04 DIAGNOSIS — L84 Corns and callosities: Secondary | ICD-10-CM

## 2022-08-04 DIAGNOSIS — M2042 Other hammer toe(s) (acquired), left foot: Secondary | ICD-10-CM | POA: Diagnosis not present

## 2022-08-04 DIAGNOSIS — M216X1 Other acquired deformities of right foot: Secondary | ICD-10-CM | POA: Diagnosis not present

## 2022-08-04 DIAGNOSIS — M2041 Other hammer toe(s) (acquired), right foot: Secondary | ICD-10-CM | POA: Diagnosis not present

## 2022-08-04 DIAGNOSIS — L97512 Non-pressure chronic ulcer of other part of right foot with fat layer exposed: Secondary | ICD-10-CM

## 2022-08-04 MED ORDER — DOXYCYCLINE HYCLATE 100 MG PO TABS: 100.0000 mg | ORAL_TABLET | Freq: Two times a day (BID) | ORAL | 0 refills | Status: DC

## 2022-08-04 NOTE — Progress Notes (Unsigned)
Subjective: Chief Complaint  Patient presents with   Diabetic Ulcer    Right foot ulcer, started draining 3 days ago, patient denies any pain, A1c- 6.2 BG- 112 X-Rays done today    66 year old male presents with above concerns.  He states he noticed a lesion on the right foot about 3 days ago he is concerned about a wound.  He has not seen any drainage.  Denies any fevers or chills.  A1c 6.2 on 06/07/2022  Objective: AAO x3, NAD DP/PT pulses palpable bilaterally, CRT less than 3 seconds On the right foot submetatarsal 2 is hyperkeratotic tissue with what appeared to be a small blister just forming adjacent to this.  After I debrided the hyperkeratotic tissue as able to visualize the wound underneath which measures 0.7 x 0.4 x 0.1 cm without any probing, amount or tunneling.  There is no surrounding erythema, ascending cellulitis.  No fluctuation or crepitation.  There was some mild malodor but appear to be more from white macerated tissue under the callus. No pain with calf compression, swelling, warmth, erythema  Assessment: Ulceration right foot, prominent metatarsal head, history of partial first amputation  Plan: -All treatment options discussed with the patient including all alternatives, risks, complications.  -X-rays were obtained and reviewed of the right foot.  3 views were obtained.  Previous partial first ray amputation.  Hammertoes present.  No subacute fracture or acute osteomyelitis noted. -I sharply debrided the hyperkeratotic lesion to reveal the underlying ulceration today.  I utilized a #[id number] with scalpel to debride nonviable devitalized tissue to promote wound healing.  Debrided the wound and healthy, granular tissue.  Not able to measure the wound prior to debridement but post wound measurements are noted above.  No blood loss.  Clean with saline.  Silvadene was applied followed by dressing.  Recommended daily dressing changes and offloading.  He has a surgical shoe at home  that I like for him to wear. -Doxycycline. -Patient encouraged to call the office with any questions, concerns, change in symptoms.   Vivi Barrack DPM

## 2022-08-07 ENCOUNTER — Other Ambulatory Visit: Payer: Self-pay | Admitting: Physician Assistant

## 2022-08-07 MED ORDER — OXYCODONE-ACETAMINOPHEN 5-325 MG PO TABS: 1.0000 | ORAL_TABLET | Freq: Four times a day (QID) | ORAL | 0 refills | Status: DC | PRN

## 2022-08-07 MED ORDER — CEFADROXIL 500 MG PO CAPS
500.0000 mg | ORAL_CAPSULE | Freq: Two times a day (BID) | ORAL | 0 refills | Status: DC
Start: 2022-08-07 — End: 2022-08-11

## 2022-08-07 MED ORDER — DOCUSATE SODIUM 100 MG PO CAPS: 100.0000 mg | ORAL_CAPSULE | Freq: Every day | ORAL | 2 refills | Status: DC | PRN

## 2022-08-07 MED ORDER — ONDANSETRON HCL 4 MG PO TABS: 4.0000 mg | ORAL_TABLET | Freq: Three times a day (TID) | ORAL | 0 refills | Status: DC | PRN

## 2022-08-07 MED ORDER — ASPIRIN 81 MG PO TBEC: 81.0000 mg | DELAYED_RELEASE_TABLET | Freq: Two times a day (BID) | ORAL | 0 refills | Status: AC

## 2022-08-08 ENCOUNTER — Other Ambulatory Visit: Payer: Self-pay | Admitting: Family Medicine

## 2022-08-08 DIAGNOSIS — E278 Other specified disorders of adrenal gland: Secondary | ICD-10-CM

## 2022-08-09 ENCOUNTER — Telehealth (HOSPITAL_COMMUNITY): Payer: Self-pay

## 2022-08-09 NOTE — Telephone Encounter (Signed)
Detailed instructions left on the patient's answering machine. Asked to call back with any questions. S.Helyne Genther EMTP 

## 2022-08-10 ENCOUNTER — Ambulatory Visit (HOSPITAL_COMMUNITY): Payer: 59 | Attending: Interventional Cardiology

## 2022-08-10 ENCOUNTER — Inpatient Hospital Stay (HOSPITAL_COMMUNITY)
Admission: RE | Admit: 2022-08-10 | Discharge: 2022-08-10 | Disposition: A | Discharge status: Home / Self Care | Payer: 59 | Source: Ambulatory Visit

## 2022-08-10 DIAGNOSIS — Z01818 Encounter for other preprocedural examination: Secondary | ICD-10-CM | POA: Insufficient documentation

## 2022-08-10 DIAGNOSIS — I25709 Atherosclerosis of coronary artery bypass graft(s), unspecified, with unspecified angina pectoris: Secondary | ICD-10-CM | POA: Diagnosis present

## 2022-08-10 LAB — MYOCARDIAL PERFUSION IMAGING
LV dias vol: 114 mL (ref 62–150)
LV sys vol: 42 mL
Nuc Stress EF: 63 %
Peak HR: 90 {beats}/min
Rest HR: 78 {beats}/min
Rest Nuclear Isotope Dose: 10.4 mCi
SDS: 0
SRS: 0
SSS: 0
ST Depression (mm): 0 mm
Stress Nuclear Isotope Dose: 31.6 mCi
TID: 0.96

## 2022-08-10 MED ORDER — TECHNETIUM TC 99M TETROFOSMIN IV KIT
31.7000 | PACK | Freq: Once | INTRAVENOUS | Status: AC | PRN
  Administered 2022-08-10: 31.7 via INTRAVENOUS

## 2022-08-10 MED ORDER — TECHNETIUM TC 99M TETROFOSMIN IV KIT
10.4000 | PACK | Freq: Once | INTRAVENOUS | Status: AC | PRN
  Administered 2022-08-10: 10.4 via INTRAVENOUS

## 2022-08-10 MED ORDER — REGADENOSON 0.4 MG/5ML IV SOLN
0.4000 mg | Freq: Once | INTRAVENOUS | Status: AC
  Administered 2022-08-10: 0.4 mg via INTRAVENOUS

## 2022-08-10 NOTE — Progress Notes (Signed)
Surgical Instructions    Your procedure is scheduled on Monday 08/14/22.  Report to [facility] Main Entrance "A" at 9:30 A.M., then check in with the Admitting office.  Call this number if you have problems the morning of surgery:  [phone]   If you have any questions prior to your surgery date call [phone]: Open Monday-Friday 8am-4pm    Remember:  Do not eat after midnight the night before your surgery  You may drink clear liquids until 9:00 the morning of your surgery.   Clear liquids allowed are: Water, Non-Citrus Juices (without pulp), Carbonated Beverages, Clear Tea, Black Coffee ONLY (NO MILK, CREAM OR POWDERED CREAMER of any kind), and Gatorade Patient Instructions  The night before surgery:  No food after midnight. ONLY clear liquids after midnight  The day of surgery (if you have diabetes): Drink ONE (1) 12 oz G2 given to you in your pre admission testing appointment by 9am the morning of surgery. Drink in one sitting. Do not sip.  This drink was given to you during your hospital  pre-op appointment visit.  Nothing else to drink after completing the  12 oz bottle of G2.        If you have questions, please contact your surgeon's office.     Take these medicines the morning of surgery with A SIP OF WATER:  ALPRAZolam Prudy Feeler) if needed buPROPion (WELLBUTRIN XL)  hydrALAZINE (APRESOLINE)  metoprolol tartrate (LOPRESSOR)   As of today, STOP taking any Aspirin (unless otherwise instructed by your surgeon) Aleve, Naproxen, Ibuprofen, Motrin, Advil, Goody's, BC's, all herbal medications, fish oil, and all vitamins.  WHAT DO I DO ABOUT MY DIABETES MEDICATION?  Hold OZEMPIC 7 days prior to scheduled procedure.  THE NIGHT BEFORE SURGERY, take 28 units of HUMALOG MIX 50/50 KWIKPEN        Do not take HUMALOG MIX 50/50 KWIKPEN the morning of surgery.   HOW TO MANAGE YOUR DIABETES BEFORE AND AFTER SURGERY  Why is it important to control my blood sugar before and  after surgery? Improving blood sugar levels before and after surgery helps healing and can limit problems. A way of improving blood sugar control is eating a healthy diet by:  Eating less sugar and carbohydrates  Increasing activity/exercise  Talking with your doctor about reaching your blood sugar goals High blood sugars (greater than 180 mg/dL) can raise your risk of infections and slow your recovery, so you will need to focus on controlling your diabetes during the weeks before surgery. Make sure that the doctor who takes care of your diabetes knows about your planned surgery including the date and location.  How do I manage my blood sugar before surgery? Check your blood sugar at least 4 times a day, starting 2 days before surgery, to make sure that the level is not too high or low.  Check your blood sugar the morning of your surgery when you wake up and every 2 hours until you get to the Short Stay unit.  If your blood sugar is less than 70 mg/dL, you will need to treat for low blood sugar: Do not take insulin. Treat a low blood sugar (less than 70 mg/dL) with  cup of clear juice (cranberry or apple), 4 glucose tablets, OR glucose gel. Recheck blood sugar in 15 minutes after treatment (to make sure it is greater than 70 mg/dL). If your blood sugar is not greater than 70 mg/dL on recheck, call [phone] for further instructions. Report your blood sugar  to the short stay nurse when you get to Short Stay.  If you are admitted to the hospital after surgery: Your blood sugar will be checked by the staff and you will probably be given insulin after surgery (instead of oral diabetes medicines) to make sure you have good blood sugar levels. The goal for blood sugar control after surgery is 80-180 mg/dL.         Do not wear jewelry or makeup. Do not wear lotions, powders, perfumes/cologne or deodorant. Do not shave 48 hours prior to surgery.  Men may shave face and neck. Do not bring  valuables to the hospital. Do not wear nail polish, gel polish, artificial nails, or any other type of covering on natural nails (fingers and toes) If you have artificial nails or gel coating that need to be removed by a nail salon, please have this removed prior to surgery. Artificial nails or gel coating may interfere with anesthesia's ability to adequately monitor your vital signs.  [facility] is not responsible for any belongings or valuables.    Do NOT Smoke (Tobacco/Vaping)  24 hours prior to your procedure  If you use a CPAP at night, you may bring your mask for your overnight stay.   Contacts, glasses, hearing aids, dentures or partials may not be worn into surgery, please bring cases for these belongings   For patients admitted to the hospital, discharge time will be determined by your treatment team.   Patients discharged the day of surgery will not be allowed to drive home, and someone needs to stay with them for 24 hours.   SURGICAL WAITING ROOM VISITATION Patients having surgery or a procedure may have no more than 2 support people in the waiting area - these visitors may rotate.   Children under the age of 64 must have an adult with them who is not the patient. If the patient needs to stay at the hospital during part of their recovery, the visitor guidelines for inpatient rooms apply. Pre-op nurse will coordinate an appropriate time for 1 support person to accompany patient in pre-op.  This support person may not rotate.   Please refer to the [facility] website for the visitor guidelines for Inpatients (after your surgery is over and you are in a regular room).    Special instructions:    Oral Hygiene is also important to reduce your risk of infection.  Remember - BRUSH YOUR TEETH THE MORNING OF SURGERY WITH YOUR REGULAR TOOTHPASTE   - Preparing For Surgery  Before surgery, you can play an important role. Because skin is not sterile, your skin needs to be as  free of germs as possible. You can reduce the number of germs on your skin by washing with CHG (chlorahexidine gluconate) Soap before surgery.  CHG is an antiseptic cleaner which kills germs and bonds with the skin to continue killing germs even after washing.     Please do not use if you have an allergy to CHG or antibacterial soaps. If your skin becomes reddened/irritated stop using the CHG.  Do not shave (including legs and underarms) for at least 48 hours prior to first CHG shower. It is OK to shave your face.  Please follow these instructions carefully.     Shower the NIGHT BEFORE SURGERY and the MORNING OF SURGERY with CHG Soap.   If you chose to wash your hair, wash your hair first as usual with your normal shampoo. After you shampoo, rinse your hair and  body thoroughly to remove the shampoo.  Then [employer] and genitals (private parts) with your normal soap and rinse thoroughly to remove soap.  After that Use CHG Soap as you would any other liquid soap. You can apply CHG directly to the skin and wash gently with a scrungie or a clean washcloth.   Apply the CHG Soap to your body ONLY FROM THE NECK DOWN.  Do not use on open wounds or open sores. Avoid contact with your eyes, ears, mouth and genitals (private parts). Wash Face and genitals (private parts)  with your normal soap.   Wash thoroughly, paying special attention to the area where your surgery will be performed.  Thoroughly rinse your body with warm water from the neck down.  DO NOT shower/wash with your normal soap after using and rinsing off the CHG Soap.  Pat yourself dry with a CLEAN TOWEL.  Wear CLEAN PAJAMAS to bed the night before surgery  Place CLEAN SHEETS on your bed the night before your surgery  DO NOT SLEEP WITH PETS.   Day of Surgery:  Take a shower with CHG soap. Wear Clean/Comfortable clothing the morning of surgery Do not apply any deodorants/lotions.   Remember to brush your teeth WITH YOUR REGULAR  TOOTHPASTE.    If you received a COVID test during your pre-op visit, it is requested that you wear a mask when out in public, stay away from anyone that may not be feeling well, and notify your surgeon if you develop symptoms. If you have been in contact with anyone that has tested positive in the last 10 days, please notify your surgeon.    Please read over the following fact sheets that you were given.

## 2022-08-10 NOTE — Progress Notes (Signed)
Surgical Instructions    Your procedure is scheduled on Monday 08/14/22.  Report to [facility] Main Entrance "A" at 9:30 A.M., then check in with the Admitting office.  Call this number if you have problems the morning of surgery:  [phone]   If you have any questions prior to your surgery date call [phone]: Open Monday-Friday 8am-4pm    Remember:  Do not eat after midnight the night before your surgery  You may drink clear liquids until 9:00 the morning of your surgery.   Clear liquids allowed are: Water, Non-Citrus Juices (without pulp), Carbonated Beverages, Clear Tea, Black Coffee ONLY (NO MILK, CREAM OR POWDERED CREAMER of any kind), and Gatorade Patient Instructions  The night before surgery:  No food after midnight. ONLY clear liquids after midnight  The day of surgery (if you have diabetes): Drink ONE (1) 12 oz G2 given to you in your pre admission testing appointment by 9am the morning of surgery. Drink in one sitting. Do not sip.  This drink was given to you during your hospital  pre-op appointment visit.  Nothing else to drink after completing the  12 oz bottle of G2.        If you have questions, please contact your surgeon's office.     Take these medicines the morning of surgery with A SIP OF WATER:  ALPRAZolam Prudy Feeler) if needed buPROPion (WELLBUTRIN XL)  hydrALAZINE (APRESOLINE)  metoprolol tartrate (LOPRESSOR)   As of today, STOP taking any Aspirin (unless otherwise instructed by your surgeon) Aleve, Naproxen, Ibuprofen, Motrin, Advil, Goody's, BC's, all herbal medications, fish oil, and all vitamins.  WHAT DO I DO ABOUT MY DIABETES MEDICATION?  Hold OZEMPIC 7 days prior to scheduled procedure. Called pt and he stated that he normally takes Ozempic on Friday. Instructed pt to not take Ozempic on Friday 9/22. Per Anesthesia instructions Ozempic must be held 7 days prior to surgery.   THE NIGHT BEFORE SURGERY, take 28 units of HUMALOG MIX 50/50 KWIKPEN         Do not take HUMALOG MIX 50/50 KWIKPEN the morning of surgery.   HOW TO MANAGE YOUR DIABETES BEFORE AND AFTER SURGERY  Why is it important to control my blood sugar before and after surgery? Improving blood sugar levels before and after surgery helps healing and can limit problems. A way of improving blood sugar control is eating a healthy diet by:  Eating less sugar and carbohydrates  Increasing activity/exercise  Talking with your doctor about reaching your blood sugar goals High blood sugars (greater than 180 mg/dL) can raise your risk of infections and slow your recovery, so you will need to focus on controlling your diabetes during the weeks before surgery. Make sure that the doctor who takes care of your diabetes knows about your planned surgery including the date and location.  How do I manage my blood sugar before surgery? Check your blood sugar at least 4 times a day, starting 2 days before surgery, to make sure that the level is not too high or low.  Check your blood sugar the morning of your surgery when you wake up and every 2 hours until you get to the Short Stay unit.  If your blood sugar is less than 70 mg/dL, you will need to treat for low blood sugar: Do not take insulin. Treat a low blood sugar (less than 70 mg/dL) with  cup of clear juice (cranberry or apple), 4 glucose tablets, OR glucose gel. Recheck blood sugar in  15 minutes after treatment (to make sure it is greater than 70 mg/dL). If your blood sugar is not greater than 70 mg/dL on recheck, call [phone] for further instructions. Report your blood sugar to the short stay nurse when you get to Short Stay.  If you are admitted to the hospital after surgery: Your blood sugar will be checked by the staff and you will probably be given insulin after surgery (instead of oral diabetes medicines) to make sure you have good blood sugar levels. The goal for blood sugar control after surgery is 80-180 mg/dL.          Do not wear jewelry or makeup. Do not wear lotions, powders, perfumes/cologne or deodorant. Do not shave 48 hours prior to surgery.  Men may shave face and neck. Do not bring valuables to the hospital. Do not wear nail polish, gel polish, artificial nails, or any other type of covering on natural nails (fingers and toes) If you have artificial nails or gel coating that need to be removed by a nail salon, please have this removed prior to surgery. Artificial nails or gel coating may interfere with anesthesia's ability to adequately monitor your vital signs.  [facility] is not responsible for any belongings or valuables.    Do NOT Smoke (Tobacco/Vaping)  24 hours prior to your procedure  If you use a CPAP at night, you may bring your mask for your overnight stay.   Contacts, glasses, hearing aids, dentures or partials may not be worn into surgery, please bring cases for these belongings   For patients admitted to the hospital, discharge time will be determined by your treatment team.   Patients discharged the day of surgery will not be allowed to drive home, and someone needs to stay with them for 24 hours.   SURGICAL WAITING ROOM VISITATION Patients having surgery or a procedure may have no more than 2 support people in the waiting area - these visitors may rotate.   Children under the age of 34 must have an adult with them who is not the patient. If the patient needs to stay at the hospital during part of their recovery, the visitor guidelines for inpatient rooms apply. Pre-op nurse will coordinate an appropriate time for 1 support person to accompany patient in pre-op.  This support person may not rotate.   Please refer to the [facility] website for the visitor guidelines for Inpatients (after your surgery is over and you are in a regular room).    Special instructions:    Oral Hygiene is also important to reduce your risk of infection.  Remember - BRUSH YOUR TEETH THE MORNING OF  SURGERY WITH YOUR REGULAR TOOTHPASTE   [facility]- Preparing For Surgery  Before surgery, you can play an important role. Because skin is not sterile, your skin needs to be as free of germs as possible. You can reduce the number of germs on your skin by washing with CHG (chlorahexidine gluconate) Soap before surgery.  CHG is an antiseptic cleaner which kills germs and bonds with the skin to continue killing germs even after washing.     Please do not use if you have an allergy to CHG or antibacterial soaps. If your skin becomes reddened/irritated stop using the CHG.  Do not shave (including legs and underarms) for at least 48 hours prior to first CHG shower. It is OK to shave your face.  Please follow these instructions carefully.     Shower the [employer] BEFORE SURGERY  and the MORNING OF SURGERY with CHG Soap.   If you chose to wash your hair, wash your hair first as usual with your normal shampoo. After you shampoo, rinse your hair and body thoroughly to remove the shampoo.  Then [employer] and genitals (private parts) with your normal soap and rinse thoroughly to remove soap.  After that Use CHG Soap as you would any other liquid soap. You can apply CHG directly to the skin and wash gently with a scrungie or a clean washcloth.   Apply the CHG Soap to your body ONLY FROM THE NECK DOWN.  Do not use on open wounds or open sores. Avoid contact with your eyes, ears, mouth and genitals (private parts). Wash Face and genitals (private parts)  with your normal soap.   Wash thoroughly, paying special attention to the area where your surgery will be performed.  Thoroughly rinse your body with warm water from the neck down.  DO NOT shower/wash with your normal soap after using and rinsing off the CHG Soap.  Pat yourself dry with a CLEAN TOWEL.  Wear CLEAN PAJAMAS to bed the night before surgery  Place CLEAN SHEETS on your bed the night before your surgery  DO NOT SLEEP WITH PETS.   Day of  Surgery:  Take a shower with CHG soap. Wear Clean/Comfortable clothing the morning of surgery Do not apply any deodorants/lotions.   Remember to brush your teeth WITH YOUR REGULAR TOOTHPASTE.    If you received a COVID test during your pre-op visit, it is requested that you wear a mask when out in public, stay away from anyone that may not be feeling well, and notify your surgeon if you develop symptoms. If you have been in contact with anyone that has tested positive in the last 10 days, please notify your surgeon.    Please read over the following fact sheets that you were given.

## 2022-08-11 ENCOUNTER — Encounter (HOSPITAL_COMMUNITY): Payer: Self-pay

## 2022-08-11 ENCOUNTER — Other Ambulatory Visit: Payer: Self-pay

## 2022-08-11 ENCOUNTER — Encounter (HOSPITAL_COMMUNITY)
Admission: RE | Admit: 2022-08-11 | Discharge: 2022-08-11 | Disposition: A | Discharge status: Home / Self Care | Payer: 59 | Source: Ambulatory Visit | Attending: Orthopaedic Surgery | Admitting: Orthopaedic Surgery

## 2022-08-11 VITALS — BP 165/88 | HR 68 | Temp 97.7°F | Resp 18 | Ht 72.0 in | Wt 250.5 lb

## 2022-08-11 DIAGNOSIS — I10 Essential (primary) hypertension: Secondary | ICD-10-CM

## 2022-08-11 DIAGNOSIS — E1159 Type 2 diabetes mellitus with other circulatory complications: Secondary | ICD-10-CM | POA: Diagnosis not present

## 2022-08-11 DIAGNOSIS — Z01818 Encounter for other preprocedural examination: Secondary | ICD-10-CM

## 2022-08-11 DIAGNOSIS — M1712 Unilateral primary osteoarthritis, left knee: Secondary | ICD-10-CM

## 2022-08-11 DIAGNOSIS — Z01812 Encounter for preprocedural laboratory examination: Secondary | ICD-10-CM | POA: Diagnosis present

## 2022-08-11 LAB — BASIC METABOLIC PANEL
Anion gap: 10 (ref 5–15)
BUN: 18 mg/dL (ref 8–23)
CO2: 23 mmol/L (ref 22–32)
Calcium: 9.2 mg/dL (ref 8.9–10.3)
Chloride: 99 mmol/L (ref 98–111)
Creatinine, Ser: 1.59 mg/dL — ABNORMAL HIGH (ref 0.61–1.24)
GFR, Estimated: 48 mL/min — ABNORMAL LOW (ref >60)
Glucose, Bld: 95 mg/dL (ref 70–99)
Potassium: 3.9 mmol/L (ref 3.5–5.1)
Sodium: 132 mmol/L — ABNORMAL LOW (ref 135–145)

## 2022-08-11 LAB — GLUCOSE, CAPILLARY: Glucose-Capillary: 92 mg/dL (ref 70–99)

## 2022-08-11 LAB — CBC
HCT: 39.1 % (ref 39.0–52.0)
Hemoglobin: 13.3 g/dL (ref 13.0–17.0)
MCH: 28 pg (ref 26.0–34.0)
MCHC: 34 g/dL (ref 30.0–36.0)
MCV: 82.3 fL (ref 80.0–100.0)
Platelets: 299 10*3/uL (ref 150–400)
RBC: 4.75 MIL/uL (ref 4.22–5.81)
RDW: 14.7 % (ref 11.5–15.5)
WBC: 8.9 10*3/uL (ref 4.0–10.5)
nRBC: 0 % (ref 0.0–0.2)

## 2022-08-11 LAB — SURGICAL PCR SCREEN
MRSA, PCR: NEGATIVE
Staphylococcus aureus: NEGATIVE

## 2022-08-11 LAB — HEMOGLOBIN A1C
Hgb A1c MFr Bld: 5.9 % — ABNORMAL HIGH (ref 4.8–5.6)
Mean Plasma Glucose: 122.63 mg/dL

## 2022-08-11 NOTE — Progress Notes (Addendum)
Updated patient's arrival time to 10am  PCP - Dr. Antony Contras Cardiologist - Dr. Daneen Schick [facility]  PPM/ICD - Denies  Chest x-ray - NI EKG - 07/26/22 Stress Test - 08/10/22 ECHO - 08/10/21 Cardiac Cath - 03/01/10  Sleep Study - Yes has OSA CPAP - Nightly usually   DM - Type II CBG at PAT appt 92 was fasting  Fasting Blood Sugar - usually 90- low 100's Checks Blood Sugar _Dexcom on R arm  Aspirin Instructions: Requested patient to call Dr. Erlinda Hong for instruction on when to stop aspirin  ERAS Protcol - Yes PRE-SURGERY G2-  given  COVID TEST- NI   Anesthesia review: Yes cardiac history  Patient denies shortness of breath, fever, cough and chest pain at PAT appointment   All instructions explained to the patient, with a verbal understanding of the material. Patient agrees to go over the instructions while at home for a better understanding.  The opportunity to ask questions was provided.

## 2022-08-14 ENCOUNTER — Encounter (HOSPITAL_COMMUNITY)
Admission: RE | Disposition: A | Discharge status: Home Health | Payer: Self-pay | Source: Home / Self Care | Attending: Orthopaedic Surgery

## 2022-08-14 ENCOUNTER — Other Ambulatory Visit: Payer: Self-pay

## 2022-08-14 ENCOUNTER — Encounter (HOSPITAL_COMMUNITY): Payer: Self-pay | Admitting: Orthopaedic Surgery

## 2022-08-14 ENCOUNTER — Ambulatory Visit (HOSPITAL_COMMUNITY): Payer: 59 | Admitting: Physician Assistant

## 2022-08-14 ENCOUNTER — Observation Stay (HOSPITAL_COMMUNITY)
Admission: RE | Admit: 2022-08-14 | Discharge: 2022-08-15 | Disposition: A | Discharge status: Home Health | Payer: 59 | Attending: Orthopaedic Surgery | Admitting: Orthopaedic Surgery

## 2022-08-14 ENCOUNTER — Observation Stay (HOSPITAL_COMMUNITY): Payer: 59

## 2022-08-14 ENCOUNTER — Ambulatory Visit (HOSPITAL_BASED_OUTPATIENT_CLINIC_OR_DEPARTMENT_OTHER): Payer: 59 | Admitting: Physician Assistant

## 2022-08-14 DIAGNOSIS — Z79899 Other long term (current) drug therapy: Secondary | ICD-10-CM | POA: Insufficient documentation

## 2022-08-14 DIAGNOSIS — Z7982 Long term (current) use of aspirin: Secondary | ICD-10-CM | POA: Diagnosis not present

## 2022-08-14 DIAGNOSIS — M1712 Unilateral primary osteoarthritis, left knee: Principal | ICD-10-CM | POA: Insufficient documentation

## 2022-08-14 DIAGNOSIS — I10 Essential (primary) hypertension: Secondary | ICD-10-CM | POA: Diagnosis not present

## 2022-08-14 DIAGNOSIS — E1159 Type 2 diabetes mellitus with other circulatory complications: Secondary | ICD-10-CM

## 2022-08-14 DIAGNOSIS — Z951 Presence of aortocoronary bypass graft: Secondary | ICD-10-CM | POA: Insufficient documentation

## 2022-08-14 DIAGNOSIS — I2510 Atherosclerotic heart disease of native coronary artery without angina pectoris: Secondary | ICD-10-CM | POA: Insufficient documentation

## 2022-08-14 DIAGNOSIS — E119 Type 2 diabetes mellitus without complications: Secondary | ICD-10-CM | POA: Insufficient documentation

## 2022-08-14 DIAGNOSIS — M65862 Other synovitis and tenosynovitis, left lower leg: Secondary | ICD-10-CM | POA: Insufficient documentation

## 2022-08-14 DIAGNOSIS — Z96652 Presence of left artificial knee joint: Secondary | ICD-10-CM

## 2022-08-14 HISTORY — PX: TOTAL KNEE ARTHROPLASTY: SHX125

## 2022-08-14 LAB — GLUCOSE, CAPILLARY
Glucose-Capillary: 117 mg/dL — ABNORMAL HIGH (ref 70–99)
Glucose-Capillary: 94 mg/dL (ref 70–99)
Glucose-Capillary: 103 mg/dL — ABNORMAL HIGH (ref 70–99)
Glucose-Capillary: 207 mg/dL — ABNORMAL HIGH (ref 70–99)

## 2022-08-14 SURGERY — ARTHROPLASTY, KNEE, TOTAL
Anesthesia: Spinal | Site: Knee | Laterality: Left

## 2022-08-14 MED ORDER — INSULIN ASPART 100 UNIT/ML IJ SOLN: 0.0000 [IU] | INTRAMUSCULAR | Status: DC | PRN

## 2022-08-14 MED ORDER — ORAL CARE MOUTH RINSE: 15.0000 mL | Freq: Once | OROMUCOSAL | Status: AC

## 2022-08-14 MED ORDER — OXYCODONE HCL 5 MG/5ML PO SOLN: 5.0000 mg | Freq: Once | ORAL | Status: DC | PRN

## 2022-08-14 MED ORDER — OXYCODONE HCL 5 MG PO TABS: 5.0000 mg | ORAL_TABLET | ORAL | Status: DC | PRN

## 2022-08-14 MED ORDER — SODIUM CHLORIDE 0.9 % IV SOLN: INTRAVENOUS | Status: DC

## 2022-08-14 MED ORDER — OXYCODONE HCL ER 10 MG PO T12A
10.0000 mg | EXTENDED_RELEASE_TABLET | Freq: Two times a day (BID) | ORAL | Status: DC
  Administered 2022-08-14 – 2022-08-15 (×2): 10 mg via ORAL
  Filled 2022-08-14 (×2): qty 1

## 2022-08-14 MED ORDER — METOPROLOL TARTRATE 100 MG PO TABS
100.0000 mg | ORAL_TABLET | Freq: Two times a day (BID) | ORAL | Status: DC
  Administered 2022-08-14 – 2022-08-15 (×2): 100 mg via ORAL
  Filled 2022-08-14 (×2): qty 1

## 2022-08-14 MED ORDER — METHOCARBAMOL 500 MG PO TABS
500.0000 mg | ORAL_TABLET | Freq: Four times a day (QID) | ORAL | Status: DC | PRN
  Administered 2022-08-15: 500 mg via ORAL
  Filled 2022-08-14: qty 1

## 2022-08-14 MED ORDER — DEXAMETHASONE SODIUM PHOSPHATE 10 MG/ML IJ SOLN
10.0000 mg | Freq: Once | INTRAMUSCULAR | Status: AC
  Administered 2022-08-15: 10 mg via INTRAVENOUS
  Filled 2022-08-14: qty 1

## 2022-08-14 MED ORDER — CLONIDINE HCL (ANALGESIA) 100 MCG/ML EP SOLN
EPIDURAL | Status: DC | PRN
  Administered 2022-08-14: 100 ug

## 2022-08-14 MED ORDER — ONDANSETRON HCL 4 MG/2ML IJ SOLN
INTRAMUSCULAR | Status: AC
  Filled 2022-08-14: qty 2

## 2022-08-14 MED ORDER — HYDROMORPHONE HCL 1 MG/ML IJ SOLN: 0.5000 mg | INTRAMUSCULAR | Status: DC | PRN

## 2022-08-14 MED ORDER — TRANEXAMIC ACID-NACL 1000-0.7 MG/100ML-% IV SOLN
INTRAVENOUS | Status: AC
  Filled 2022-08-14: qty 100

## 2022-08-14 MED ORDER — INSULIN ASPART 100 UNIT/ML IJ SOLN
0.0000 [IU] | Freq: Every day | INTRAMUSCULAR | Status: DC
  Administered 2022-08-14: 2 [IU] via SUBCUTANEOUS

## 2022-08-14 MED ORDER — POVIDONE-IODINE 10 % EX SWAB
2.0000 | Freq: Once | CUTANEOUS | Status: AC
  Administered 2022-08-14: 2 via TOPICAL

## 2022-08-14 MED ORDER — MIDAZOLAM HCL 2 MG/2ML IJ SOLN
INTRAMUSCULAR | Status: AC
  Administered 2022-08-14: 1 mg via INTRAVENOUS
  Filled 2022-08-14: qty 2

## 2022-08-14 MED ORDER — HYDRALAZINE HCL 50 MG PO TABS
50.0000 mg | ORAL_TABLET | Freq: Two times a day (BID) | ORAL | Status: DC
  Administered 2022-08-14 – 2022-08-15 (×2): 50 mg via ORAL
  Filled 2022-08-14 (×2): qty 1

## 2022-08-14 MED ORDER — KETOROLAC TROMETHAMINE 15 MG/ML IJ SOLN
7.5000 mg | Freq: Four times a day (QID) | INTRAMUSCULAR | Status: DC
  Administered 2022-08-14 – 2022-08-15 (×3): 7.5 mg via INTRAVENOUS
  Filled 2022-08-14 (×3): qty 1

## 2022-08-14 MED ORDER — FERROUS SULFATE 325 (65 FE) MG PO TABS
325.0000 mg | ORAL_TABLET | Freq: Three times a day (TID) | ORAL | Status: DC
  Administered 2022-08-15: 325 mg via ORAL
  Filled 2022-08-14: qty 1

## 2022-08-14 MED ORDER — FENTANYL CITRATE (PF) 100 MCG/2ML IJ SOLN: 50.0000 ug | Freq: Once | INTRAMUSCULAR | Status: AC

## 2022-08-14 MED ORDER — METOCLOPRAMIDE HCL 5 MG PO TABS: 5.0000 mg | ORAL_TABLET | Freq: Three times a day (TID) | ORAL | Status: DC | PRN

## 2022-08-14 MED ORDER — CEFAZOLIN SODIUM-DEXTROSE 2-4 GM/100ML-% IV SOLN
2.0000 g | Freq: Four times a day (QID) | INTRAVENOUS | Status: AC
  Administered 2022-08-14 – 2022-08-15 (×2): 2 g via INTRAVENOUS
  Filled 2022-08-14 (×2): qty 100

## 2022-08-14 MED ORDER — OXYCODONE HCL 5 MG PO TABS: 10.0000 mg | ORAL_TABLET | ORAL | Status: DC | PRN

## 2022-08-14 MED ORDER — LACTATED RINGERS IV SOLN: INTRAVENOUS | Status: DC

## 2022-08-14 MED ORDER — TRANEXAMIC ACID 1000 MG/10ML IV SOLN
INTRAVENOUS | Status: DC | PRN
  Administered 2022-08-14: 2000 mg via TOPICAL

## 2022-08-14 MED ORDER — METOCLOPRAMIDE HCL 5 MG/ML IJ SOLN: 5.0000 mg | Freq: Three times a day (TID) | INTRAMUSCULAR | Status: DC | PRN

## 2022-08-14 MED ORDER — MENTHOL 3 MG MT LOZG: 1.0000 | LOZENGE | OROMUCOSAL | Status: DC | PRN

## 2022-08-14 MED ORDER — FENTANYL CITRATE (PF) 100 MCG/2ML IJ SOLN: 25.0000 ug | INTRAMUSCULAR | Status: DC | PRN

## 2022-08-14 MED ORDER — PHENYLEPHRINE HCL-NACL 20-0.9 MG/250ML-% IV SOLN
INTRAVENOUS | Status: DC | PRN
  Administered 2022-08-14: 30 ug/min via INTRAVENOUS

## 2022-08-14 MED ORDER — INSULIN LISPRO PROT & LISPRO (50-50 MIX) 100 UNIT/ML KWIKPEN: 40.0000 [IU] | PEN_INJECTOR | Freq: Three times a day (TID) | SUBCUTANEOUS | Status: DC

## 2022-08-14 MED ORDER — INSULIN ASPART 100 UNIT/ML IJ SOLN
0.0000 [IU] | Freq: Three times a day (TID) | INTRAMUSCULAR | Status: DC
  Administered 2022-08-15: 2 [IU] via SUBCUTANEOUS

## 2022-08-14 MED ORDER — FENTANYL CITRATE (PF) 100 MCG/2ML IJ SOLN
INTRAMUSCULAR | Status: AC
  Administered 2022-08-14: 50 ug via INTRAVENOUS
  Filled 2022-08-14: qty 2

## 2022-08-14 MED ORDER — INSULIN ASPART 100 UNIT/ML IJ SOLN
20.0000 [IU] | Freq: Three times a day (TID) | INTRAMUSCULAR | Status: DC
  Administered 2022-08-15: 20 [IU] via SUBCUTANEOUS

## 2022-08-14 MED ORDER — BUPIVACAINE-EPINEPHRINE (PF) 0.5% -1:200000 IJ SOLN
INTRAMUSCULAR | Status: DC | PRN
  Administered 2022-08-14: 15 mL via PERINEURAL

## 2022-08-14 MED ORDER — DOCUSATE SODIUM 100 MG PO CAPS
100.0000 mg | ORAL_CAPSULE | Freq: Two times a day (BID) | ORAL | Status: DC
  Administered 2022-08-14 – 2022-08-15 (×2): 100 mg via ORAL
  Filled 2022-08-14 (×2): qty 1

## 2022-08-14 MED ORDER — INSULIN GLARGINE-YFGN 100 UNIT/ML ~~LOC~~ SOLN
60.0000 [IU] | Freq: Every day | SUBCUTANEOUS | Status: DC
  Administered 2022-08-15: 60 [IU] via SUBCUTANEOUS
  Filled 2022-08-14: qty 0.6

## 2022-08-14 MED ORDER — ACETAMINOPHEN 500 MG PO TABS
1000.0000 mg | ORAL_TABLET | Freq: Four times a day (QID) | ORAL | Status: DC
  Administered 2022-08-14 – 2022-08-15 (×2): 1000 mg via ORAL
  Filled 2022-08-14 (×2): qty 2

## 2022-08-14 MED ORDER — BUPROPION HCL ER (XL) 300 MG PO TB24
300.0000 mg | ORAL_TABLET | Freq: Every morning | ORAL | Status: DC
  Administered 2022-08-15: 300 mg via ORAL
  Filled 2022-08-14: qty 1

## 2022-08-14 MED ORDER — METHOCARBAMOL 1000 MG/10ML IJ SOLN: 500.0000 mg | Freq: Four times a day (QID) | INTRAVENOUS | Status: DC | PRN

## 2022-08-14 MED ORDER — AMLODIPINE BESYLATE 10 MG PO TABS
10.0000 mg | ORAL_TABLET | Freq: Every day | ORAL | Status: DC
  Administered 2022-08-14: 10 mg via ORAL
  Filled 2022-08-14: qty 1

## 2022-08-14 MED ORDER — PHENOL 1.4 % MT LIQD: 1.0000 | OROMUCOSAL | Status: DC | PRN

## 2022-08-14 MED ORDER — CHLORHEXIDINE GLUCONATE 0.12 % MT SOLN
OROMUCOSAL | Status: AC
  Administered 2022-08-14: 15 mL via OROMUCOSAL
  Filled 2022-08-14: qty 15

## 2022-08-14 MED ORDER — TRANEXAMIC ACID 1000 MG/10ML IV SOLN
2000.0000 mg | INTRAVENOUS | Status: DC
  Filled 2022-08-14: qty 20

## 2022-08-14 MED ORDER — DEXAMETHASONE SODIUM PHOSPHATE 10 MG/ML IJ SOLN
INTRAMUSCULAR | Status: AC
  Filled 2022-08-14: qty 1

## 2022-08-14 MED ORDER — ONDANSETRON HCL 4 MG/2ML IJ SOLN
INTRAMUSCULAR | Status: DC | PRN
  Administered 2022-08-14: 4 mg via INTRAVENOUS

## 2022-08-14 MED ORDER — TRANEXAMIC ACID-NACL 1000-0.7 MG/100ML-% IV SOLN
1000.0000 mg | INTRAVENOUS | Status: AC
  Administered 2022-08-14: 1000 mg via INTRAVENOUS

## 2022-08-14 MED ORDER — IRRISEPT - 450ML BOTTLE WITH 0.05% CHG IN STERILE WATER, USP 99.95% OPTIME
TOPICAL | Status: DC | PRN
  Administered 2022-08-14: 450 mL via TOPICAL

## 2022-08-14 MED ORDER — 0.9 % SODIUM CHLORIDE (POUR BTL) OPTIME
TOPICAL | Status: DC | PRN
  Administered 2022-08-14: 1000 mL

## 2022-08-14 MED ORDER — CEFAZOLIN SODIUM-DEXTROSE 2-4 GM/100ML-% IV SOLN
2.0000 g | INTRAVENOUS | Status: AC
  Administered 2022-08-14: 2 g via INTRAVENOUS

## 2022-08-14 MED ORDER — MIDAZOLAM HCL 2 MG/2ML IJ SOLN: 1.0000 mg | Freq: Once | INTRAMUSCULAR | Status: AC

## 2022-08-14 MED ORDER — ASPIRIN 81 MG PO CHEW
81.0000 mg | CHEWABLE_TABLET | Freq: Two times a day (BID) | ORAL | Status: DC
  Administered 2022-08-14 – 2022-08-15 (×2): 81 mg via ORAL
  Filled 2022-08-14 (×2): qty 1

## 2022-08-14 MED ORDER — CEFAZOLIN SODIUM-DEXTROSE 2-4 GM/100ML-% IV SOLN
INTRAVENOUS | Status: AC
  Filled 2022-08-14: qty 100

## 2022-08-14 MED ORDER — CHLORHEXIDINE GLUCONATE 0.12 % MT SOLN: 15.0000 mL | Freq: Once | OROMUCOSAL | Status: AC

## 2022-08-14 MED ORDER — ONDANSETRON HCL 4 MG PO TABS: 4.0000 mg | ORAL_TABLET | Freq: Four times a day (QID) | ORAL | Status: DC | PRN

## 2022-08-14 MED ORDER — ACETAMINOPHEN 325 MG PO TABS: 325.0000 mg | ORAL_TABLET | Freq: Four times a day (QID) | ORAL | Status: DC | PRN

## 2022-08-14 MED ORDER — OXYCODONE HCL 5 MG PO TABS: 5.0000 mg | ORAL_TABLET | Freq: Once | ORAL | Status: DC | PRN

## 2022-08-14 MED ORDER — DEXAMETHASONE SODIUM PHOSPHATE 10 MG/ML IJ SOLN
INTRAMUSCULAR | Status: DC | PRN
  Administered 2022-08-14: 10 mg via INTRAVENOUS

## 2022-08-14 MED ORDER — VANCOMYCIN HCL 1000 MG IV SOLR
INTRAVENOUS | Status: DC | PRN
  Administered 2022-08-14: 1000 mg via TOPICAL

## 2022-08-14 MED ORDER — SODIUM CHLORIDE 0.9 % IR SOLN
Status: DC | PRN
  Administered 2022-08-14: 1000 mL

## 2022-08-14 MED ORDER — TRANEXAMIC ACID-NACL 1000-0.7 MG/100ML-% IV SOLN
1000.0000 mg | Freq: Once | INTRAVENOUS | Status: DC
  Filled 2022-08-14 (×2): qty 100

## 2022-08-14 MED ORDER — LACTATED RINGERS IV SOLN
INTRAVENOUS | Status: DC
Start: 2022-08-14 — End: 2022-08-14

## 2022-08-14 MED ORDER — BUPIVACAINE IN DEXTROSE 0.75-8.25 % IT SOLN
INTRATHECAL | Status: DC | PRN
  Administered 2022-08-14: 1.6 mL via INTRATHECAL

## 2022-08-14 MED ORDER — ONDANSETRON HCL 4 MG/2ML IJ SOLN: 4.0000 mg | Freq: Four times a day (QID) | INTRAMUSCULAR | Status: DC | PRN

## 2022-08-14 MED ORDER — PROPOFOL 500 MG/50ML IV EMUL
INTRAVENOUS | Status: DC | PRN
  Administered 2022-08-14: 100 ug/kg/min via INTRAVENOUS

## 2022-08-14 MED ORDER — BUPIVACAINE-MELOXICAM ER 400-12 MG/14ML IJ SOLN
INTRAMUSCULAR | Status: DC | PRN
  Administered 2022-08-14: 400 mg

## 2022-08-14 MED ORDER — ACETAMINOPHEN 500 MG PO TABS
1000.0000 mg | ORAL_TABLET | Freq: Once | ORAL | Status: AC
  Administered 2022-08-14: 1000 mg via ORAL
  Filled 2022-08-14: qty 2

## 2022-08-14 MED ORDER — DROPERIDOL 2.5 MG/ML IJ SOLN: 0.6250 mg | Freq: Once | INTRAMUSCULAR | Status: DC | PRN

## 2022-08-14 MED ORDER — CEFADROXIL 500 MG PO CAPS: 500.0000 mg | ORAL_CAPSULE | Freq: Two times a day (BID) | ORAL | 0 refills | Status: DC

## 2022-08-14 SURGICAL SUPPLY — 86 items
ADH SKN CLS APL DERMABOND .7 (GAUZE/BANDAGES/DRESSINGS) ×1
ALCOHOL 70% 16 OZ (MISCELLANEOUS) ×1 IMPLANT
BAG COUNTER SPONGE SURGICOUNT (BAG) IMPLANT
BAG DECANTER FOR FLEXI CONT (MISCELLANEOUS) ×1 IMPLANT
BAG SPNG CNTER NS LX DISP (BAG) ×1
BANDAGE ESMARK 6X9 LF (GAUZE/BANDAGES/DRESSINGS) IMPLANT
BLADE SAG 18X100X1.27 (BLADE) ×1 IMPLANT
BLADE SAW SAG 90X13X1.27 (BLADE) IMPLANT
BNDG CMPR 9X6 STRL LF SNTH (GAUZE/BANDAGES/DRESSINGS) ×1
BNDG ESMARK 6X9 LF (GAUZE/BANDAGES/DRESSINGS) ×1
BOWL SMART MIX CTS (DISPOSABLE) ×1 IMPLANT
BSPLAT TIB 5D G CMNT STM LT (Knees) ×1 IMPLANT
CEMENT BONE REFOBACIN R1X40 US (Cement) IMPLANT
CLSR STERI-STRIP ANTIMIC 1/2X4 (GAUZE/BANDAGES/DRESSINGS) ×2 IMPLANT
COMP FEM CEMT PERSONA SZ9 LT (Knees) ×1 IMPLANT
COMPONENT FEM CEMT PRNSA SZ9LT (Knees) IMPLANT
COOLER ICEMAN CLASSIC (MISCELLANEOUS) ×1 IMPLANT
COVER SURGICAL LIGHT HANDLE (MISCELLANEOUS) ×1 IMPLANT
CUFF TOURN SGL QUICK 34 (TOURNIQUET CUFF) ×1
CUFF TOURN SGL QUICK 42 (TOURNIQUET CUFF) IMPLANT
CUFF TRNQT CYL 34X4.125X (TOURNIQUET CUFF) ×1 IMPLANT
DERMABOND ADVANCED .7 DNX12 (GAUZE/BANDAGES/DRESSINGS) ×1 IMPLANT
DRAPE EXTREMITY T 121X128X90 (DISPOSABLE) ×1 IMPLANT
DRAPE HALF SHEET 40X57 (DRAPES) ×1 IMPLANT
DRAPE INCISE IOBAN 66X45 STRL (DRAPES) ×1 IMPLANT
DRAPE ORTHO SPLIT 77X108 STRL (DRAPES)
DRAPE POUCH INSTRU U-SHP 10X18 (DRAPES) ×1 IMPLANT
DRAPE SURG ORHT 6 SPLT 77X108 (DRAPES) ×2 IMPLANT
DRAPE U-SHAPE 47X51 STRL (DRAPES) ×2 IMPLANT
DRSG AQUACEL AG ADV 3.5X10 (GAUZE/BANDAGES/DRESSINGS) ×1 IMPLANT
DURAPREP 26ML APPLICATOR (WOUND CARE) ×3 IMPLANT
ELECT CAUTERY BLADE 6.4 (BLADE) ×1 IMPLANT
ELECT REM PT RETURN 9FT ADLT (ELECTROSURGICAL) ×1
ELECTRODE REM PT RTRN 9FT ADLT (ELECTROSURGICAL) ×1 IMPLANT
GLOVE BIOGEL PI IND STRL 7.0 (GLOVE) ×2 IMPLANT
GLOVE BIOGEL PI IND STRL 7.5 (GLOVE) ×5 IMPLANT
GLOVE ECLIPSE 7.0 STRL STRAW (GLOVE) ×3 IMPLANT
GLOVE SKINSENSE STRL SZ7.5 (GLOVE) ×3 IMPLANT
GLOVE SURG SYN 7.5  E (GLOVE)
GLOVE SURG SYN 7.5 E (GLOVE) IMPLANT
GLOVE SURG SYN 7.5 PF PI (GLOVE) ×2 IMPLANT
GLOVE SURG UNDER LTX SZ7.5 (GLOVE) ×2 IMPLANT
GLOVE SURG UNDER POLY LF SZ7 (GLOVE) ×2 IMPLANT
GOWN STRL REIN XL XLG (GOWN DISPOSABLE) ×1 IMPLANT
GOWN STRL REUS W/ TWL LRG LVL3 (GOWN DISPOSABLE) ×1 IMPLANT
GOWN STRL REUS W/TWL LRG LVL3 (GOWN DISPOSABLE) ×1
HANDPIECE INTERPULSE COAX TIP (DISPOSABLE) ×1
HDLS TROCR DRIL PIN KNEE 75 (PIN) ×1
HOOD PEEL AWAY FLYTE STAYCOOL (MISCELLANEOUS) ×2 IMPLANT
JET LAVAGE IRRISEPT WOUND (IRRIGATION / IRRIGATOR) ×1
KIT BASIN OR (CUSTOM PROCEDURE TRAY) ×1 IMPLANT
KIT TURNOVER KIT B (KITS) ×1 IMPLANT
LAVAGE JET IRRISEPT WOUND (IRRIGATION / IRRIGATOR) ×1 IMPLANT
LINER TIB ASF PS GH/8-11 14 LT (Liner) IMPLANT
MANIFOLD NEPTUNE II (INSTRUMENTS) ×1 IMPLANT
MARKER SKIN DUAL TIP RULER LAB (MISCELLANEOUS) ×2 IMPLANT
NDL SPNL 18GX3.5 QUINCKE PK (NEEDLE) ×1 IMPLANT
NEEDLE SPNL 18GX3.5 QUINCKE PK (NEEDLE) ×1 IMPLANT
NS IRRIG 1000ML POUR BTL (IV SOLUTION) ×1 IMPLANT
PACK TOTAL JOINT (CUSTOM PROCEDURE TRAY) ×1 IMPLANT
PAD ARMBOARD 7.5X6 YLW CONV (MISCELLANEOUS) ×2 IMPLANT
PAD COLD SHLDR WRAP-ON (PAD) ×1 IMPLANT
PIN DRILL HDLS TROCAR 75 4PK (PIN) IMPLANT
SAW OSC TIP CART 19.5X105X1.3 (SAW) ×1 IMPLANT
SCREW FEMALE HEX FIX 25X2.5 (ORTHOPEDIC DISPOSABLE SUPPLIES) IMPLANT
SET HNDPC FAN SPRY TIP SCT (DISPOSABLE) ×1 IMPLANT
STAPLER VISISTAT 35W (STAPLE) IMPLANT
STEM POLY PAT PLY 32M KNEE (Knees) IMPLANT
STEM TIB ST PERS 14+30 (Stem) IMPLANT
STEM TIBIA 5 DEG SZ G L KNEE (Knees) IMPLANT
SUCTION FRAZIER HANDLE 10FR (MISCELLANEOUS) ×2
SUCTION TUBE FRAZIER 10FR DISP (MISCELLANEOUS) ×1 IMPLANT
SUT ETHILON 2 0 FS 18 (SUTURE) IMPLANT
SUT MNCRL AB 3-0 PS2 27 (SUTURE) IMPLANT
SUT VIC AB 0 CT1 27 (SUTURE)
SUT VIC AB 0 CT1 27XBRD ANBCTR (SUTURE) ×2 IMPLANT
SUT VIC AB 1 CTX 27 (SUTURE) ×3 IMPLANT
SUT VIC AB 2-0 CT1 27 (SUTURE) ×4
SUT VIC AB 2-0 CT1 TAPERPNT 27 (SUTURE) ×4 IMPLANT
SYR 50ML LL SCALE MARK (SYRINGE) ×2 IMPLANT
TIBIA STEM 5 DEG SZ G L KNEE (Knees) ×1 IMPLANT
TOWEL GREEN STERILE (TOWEL DISPOSABLE) ×1 IMPLANT
TOWEL GREEN STERILE FF (TOWEL DISPOSABLE) ×1 IMPLANT
TRAY CATH 16FR W/PLASTIC CATH (SET/KITS/TRAYS/PACK) IMPLANT
UNDERPAD 30X36 HEAVY ABSORB (UNDERPADS AND DIAPERS) ×1 IMPLANT
YANKAUER SUCT BULB TIP NO VENT (SUCTIONS) ×2 IMPLANT

## 2022-08-14 NOTE — Progress Notes (Signed)
PHARMACIST - PHYSICIAN ORDER COMMUNICATION  CONCERNING: Non-formulary insulin lispro protamine & lispro (Humalog 50/50 mix) 40 units TID  DESCRIPTION:  This patient's order for:  insulin Lispro prot & lispro (Humalog 50/50 mix)  has been noted.  This product(s) is not stocked in the pharmacy.    ACTION TAKEN: The pharmacy department has ordered insulin aspart 20 units TID and insulin glargine 60 units daily as an equivalent insulin dose.   Jeneen Rinks, Pharm.D PGY1 Pharmacy Resident 08/14/2022 8:40 PM

## 2022-08-14 NOTE — Op Note (Signed)
Total Knee Arthroplasty Procedure Note  Preoperative diagnosis: Left knee osteoarthritis  Postoperative diagnosis:same  Operative findings: Severe wear of femoral condyles Extensive synovitis No evidence of infection  Operative procedure: Left total knee arthroplasty. CPT [phone]  Surgeon: N. Eduard Roux, MD  Assist: Madalyn Rob, PA-C; necessary for the timely completion of procedure and due to complexity of procedure.  Anesthesia: Spinal, regional, local  Tourniquet time: see anesthesia record  Implants used: Zimmer persona Femur: CR 9 standard Tibia: G with 30 mm stem Patella: 32 mm Polyethylene: 14 mm medial congruent  Indication: [name] [name] is a 66 y.o. year old male with a history of knee pain. Having failed conservative management, the patient elected to proceed with a total knee arthroplasty.  We have reviewed the risk and benefits of the surgery and they elected to proceed after voicing understanding.  Procedure:  After informed consent was obtained and understanding of the risk were voiced including but not limited to bleeding, infection, damage to surrounding structures including nerves and vessels, blood clots, leg length inequality and the failure to achieve desired results, the operative extremity was marked with verbal confirmation of the patient in the holding area.   The patient was then brought to the operating room and transported to the operating room table in the supine position.  A tourniquet was applied to the operative extremity around the upper thigh. The operative limb was then prepped and draped in the usual sterile fashion and preoperative antibiotics were administered.  A time out was performed prior to the start of surgery confirming the correct extremity, preoperative antibiotic administration, as well as team members, implants and instruments available for the case. Correct surgical site was also confirmed with preoperative  radiographs. The limb was then elevated for exsanguination and the tourniquet was inflated. A midline incision was made and a standard medial parapatellar approach was performed.  Circumferential synovectomy was performed from the suprapatellar pouch and the gutters.  The infrapatellar fat pad was removed.   A medial peel was performed to release the capsule off of the medial tibial plateau back to the semimembranosus tendon.  The patella was then everted and was prepared and sized to a 32 mm.  Bone quality was poor.  A cover was placed on the patella for protection from retractors.  The knee was then brought into full flexion and we then turned our attention to the femur.  The cruciates were sacrificed.  Start site was drilled in the femur and the intramedullary distal femoral cutting guide was placed, set at 5 degrees valgus, taking 10 mm of distal resection. The distal cut was made. Osteophytes were then removed.  Next, the proximal tibial cutting guide was placed with appropriate slope, varus/valgus alignment and depth of resection. The proximal tibial cut was made taking for mm off the low side. Gap blocks were then used to assess the extension gap and alignment, and appropriate soft tissue releases were performed. Attention was turned back to the femur, which was sized using the sizing guide to a size 9 standard. Appropriate rotation of the femoral component was determined using epicondylar axis, Whiteside's line, and assessing the flexion gap under ligament tension. The appropriate size 4-in-1 cutting block was placed and checked with an angel wing and cuts were made. Posterior femoral osteophytes and uncapped bone were then removed with the curved osteotome.  Trial components were placed, and stability was checked in full extension, mid-flexion, and deep flexion. Proper tibial rotation was determined and  marked.  The patella tracked well without a lateral release.  The femoral lugs were then  drilled. Trial components were then removed and tibial preparation performed.  There were 3 contained defects in the medial tibial plateau from subchondral cysts.  These were curetted out.  The tibia was sized for a size G component.   The bony surfaces were irrigated with a pulse lavage and then dried. Bone cement was vacuum mixed on the back table, and the final components sized above were cemented into place.  Antibiotic irrigation was placed in the knee joint and soft tissues while the cement cured.  After cement had finished curing, excess cement was removed. The stability of the construct was re-evaluated throughout a range of motion and found to be acceptable. The trial liner was removed, the knee was copiously irrigated, and the knee was re-evaluated for any excess bone debris. The real polyethylene liner, 14 mm thick, was inserted and checked to ensure the locking mechanism had engaged appropriately. The tourniquet was deflated and hemostasis was achieved. The wound was irrigated with normal saline.  One gram of vancomycin powder was placed in the surgical bed.  Topical 0.25% bupivacaine and meloxicam was placed in the joint for postoperative pain.  Capsular closure was performed with a #1 vicryl, subcutaneous fat closed with a 0 vicryl suture, then subcutaneous tissue closed with interrupted 2.0 vicryl suture. The skin was then closed with a 2-0 nylon and Dermabond. A sterile dressing was applied.  The patient was awakened in the operating room and taken to recovery in stable condition. All sponge, needle, and instrument counts were correct at the end of the case.  [name] [name] was necessary for opening, closing, retracting, limb positioning and overall facilitation and completion of the surgery.  Position: supine  Complications: none.  Time Out: performed   Drains/Packing: none  Estimated blood loss: minimal  Returned to Recovery Room: in good condition.   Antibiotics: yes    Mechanical VTE (DVT) Prophylaxis: sequential compression devices, TED thigh-high  Chemical VTE (DVT) Prophylaxis: Aspirin postoperative day 0  Fluid Replacement  Crystalloid: see anesthesia record Blood: none  FFP: none   Specimens Removed: 1 to pathology   Sponge and Instrument Count Correct? yes   PACU: portable radiograph - knee AP and Lateral   Plan/RTC: Return in 2 weeks for wound check.   Weight Bearing/Load Lower Extremity: full   Implant Name Type Inv. Item Serial No. Manufacturer Lot No. LRB No. Used Action  CEMENT BONE REFOBACIN [id number] [place] - [id number] Cement CEMENT BONE REFOBACIN [id number] [place]  ZIMMER RECON(ORTH,TRAU,BIO,SG) [id number] Left 2 Implanted  STEM POLY PAT PLY 5M KNEE - [id number] Knees STEM POLY PAT PLY 5M KNEE  ZIMMER RECON(ORTH,TRAU,BIO,SG) [id number] Left 1 Implanted  COMP FEM CEMT PERSONA SZ9 LT - [id number] Knees COMP FEM CEMT PERSONA SZ9 LT  ZIMMER RECON(ORTH,TRAU,BIO,SG) [id number] Left 1 Implanted  TIBIA STEM 5 DEG SZ G L KNEE - [id number] Knees TIBIA STEM 5 DEG SZ G L KNEE  ZIMMER RECON(ORTH,TRAU,BIO,SG) [id number] Left 1 Implanted  STEM TIB ST PERS 14+30 - [id number] Stem STEM TIB ST PERS 14+30  ZIMMER RECON(ORTH,TRAU,BIO,SG) [id number] Left 1 Implanted  LINER TIB ASF PS GH/8-11 14 LT - [id number] Liner LINER TIB ASF PS GH/8-11 14 LT  ZIMMER RECON(ORTH,TRAU,BIO,SG) [id number] Left 1 Implanted    N. Glee Arvin, MD [facility] 5:30 PM

## 2022-08-14 NOTE — Anesthesia Procedure Notes (Signed)
Anesthesia Regional Block: Adductor canal block   Pre-Anesthetic Checklist: , timeout performed,  Correct Patient, Correct Site, Correct Laterality,  Correct Procedure, Correct Position, site marked,  Risks and benefits discussed,  Pre-op evaluation,  At surgeon's request and post-op pain management  Laterality: Left  Prep: Maximum Sterile Barrier Precautions used, chloraprep       Needles:  Injection technique: Single-shot  Needle Type: Echogenic Stimulator Needle     Needle Length: 9cm  Needle Gauge: 22     Additional Needles:   Procedures:,,,, ultrasound used (permanent image in chart),,    Narrative:  Start time: 08/14/2022 12:21 PM End time: 08/14/2022 12:24 PM Injection made incrementally with aspirations every 5 mL.  Performed by: Personally  Anesthesiologist: Brennan Bailey, MD  Additional Notes: Risks, benefits, and alternative discussed. Patient gave consent for procedure. Patient prepped and draped in sterile fashion. Sedation administered, patient remains easily responsive to voice. Relevant anatomy identified with ultrasound guidance. Local anesthetic given in 5cc increments with no signs or symptoms of intravascular injection. No pain or paraesthesias with injection. Patient monitored throughout procedure with signs of LAST or immediate complications. Tolerated well. Ultrasound image placed in chart.  Tawny Asal, MD

## 2022-08-14 NOTE — Anesthesia Preprocedure Evaluation (Addendum)
Anesthesia Evaluation  Patient identified by MRN, date of birth, ID band Patient awake    Reviewed: Allergy & Precautions, NPO status , Patient's Chart, lab work & pertinent test results, reviewed documented beta blocker date and time   History of Anesthesia Complications Negative for: history of anesthetic complications  Airway Mallampati: IV  TM Distance: >3 FB Neck ROM: Full    Dental no notable dental hx. (+) Missing,    Pulmonary sleep apnea and Continuous Positive Airway Pressure Ventilation ,    Pulmonary exam normal        Cardiovascular hypertension, Pt. on medications and Pt. on home beta blockers + CAD and + CABG (2011)  Normal cardiovascular exam     Neuro/Psych Anxiety Depression negative neurological ROS     GI/Hepatic Neg liver ROS, GERD  Medicated and Controlled,  Endo/Other  diabetes, Type 2, Insulin Dependent  Renal/GU Renal InsufficiencyRenal disease  negative genitourinary   Musculoskeletal  (+) Arthritis ,   Abdominal   Peds  Hematology negative hematology ROS (+)   Anesthesia Other Findings Day of surgery medications reviewed with patient.  Reproductive/Obstetrics negative OB ROS                            Anesthesia Physical Anesthesia Plan  ASA: 2  Anesthesia Plan: Spinal   Post-op Pain Management: Regional block* and Tylenol PO (pre-op)*   Induction:   PONV Risk Score and Plan: 2 and Treatment may vary due to age or medical condition, Ondansetron, Propofol infusion and Dexamethasone  Airway Management Planned: Natural Airway and Simple Face Mask  Additional Equipment: None  Intra-op Plan:   Post-operative Plan:   Informed Consent: I have reviewed the patients History and Physical, chart, labs and discussed the procedure including the risks, benefits and alternatives for the proposed anesthesia with the patient or authorized representative who has  indicated his/her understanding and acceptance.       Plan Discussed with: CRNA  Anesthesia Plan Comments:        Anesthesia Quick Evaluation

## 2022-08-14 NOTE — Discharge Instructions (Signed)

## 2022-08-14 NOTE — Transfer of Care (Signed)
Immediate Anesthesia Transfer of Care Note  Patient: [name] [name]  Procedure(s) Performed: LEFT TOTAL KNEE ARTHROPLASTY (Left: Knee)  Patient Location: PACU  Anesthesia Type:Spinal  Level of Consciousness: awake, alert  and oriented  Airway & Oxygen Therapy: Patient Spontanous Breathing  Post-op Assessment: Report given to RN, Post -op Vital signs reviewed and stable and Patient moving all extremities X 4  Post vital signs: Reviewed and stable  Last Vitals:  Vitals Value Taken Time  BP    Temp    Pulse    Resp    SpO2      Last Pain:  Vitals:   08/14/22 1036  TempSrc:   PainSc: 0-No pain         Complications: No notable events documented.

## 2022-08-14 NOTE — H&P (Signed)
PREOPERATIVE H&P  Chief Complaint: left knee degenerative joint disease  HPI: [name] [name] is a 66 y.o. male who presents for surgical treatment of left knee degenerative joint disease.  He denies any changes in medical history.  Past Medical History:  Diagnosis Date   Anxiety    Baker's cyst 10/06/2021   Bulbous urethral stricture    chronic--- hx multiple dilatation's and urolume stent's   CAD (coronary artery disease) 04/ 2011  (previous cardiologist-  dr h. Katrinka Blazing , lov 06/ 2016)  currently followed by pcp   03-03-2010  CABG x 5, LIMA to LAD, left radial artery to OM1,SVG to R1, seqSVG to PDA and PLA   Cyanosis 10/06/2021   Depression    ED (erectile dysfunction)    Essential hypertension, benign    GERD (gastroesophageal reflux disease)    History of pancreatitis 2005   Knee effusion 10/06/2021   Mixed hyperlipidemia    OSA on CPAP    not used recently   S/P CABG x 5 03/03/2010   Septic arthritis of knee, left (HCC) 11/11/2021   Type II or unspecified type diabetes mellitus without mention of complication, not stated as uncontrolled    endocrinologist-  dr Sharl Ma   Past Surgical History:  Procedure Laterality Date   AMPUTATION Right 08/09/2021   Procedure: AMPUTATION DIGIT;  Surgeon: Vivi Barrack, DPM;  Location: [facility];  Service: Podiatry;  Laterality: Right;   CARDIAC CATHETERIZATION  03-01-2010   dr Verdis Prime   nuclear stress test --positive moderate ishemia:  severe multivessel CAD w/ total occlusion of pLAD, HG obstrucion of pPDA with supplies collaterals around the apex to the LAD, severe stenosis distal RCA, total occlusion OM1, 75% D1, normal LVF    CORONARY ARTERY BYPASS GRAFT  03-03-2010  dr hendrickson    LIMA to LAD, SVG to RI, left radial artery to OM1, seqSVG  to PDA and PLA   CYST EXCISION Left 11/02/2021   Procedure: REMOVAL OF LEFT CALF MASS;  Surgeon: Tarry Kos, MD;  Location: [facility];  Service: Orthopedics;  Laterality:  Left;   CYSTO W/ DILATATION , LASER INCISION OF URETHRAL STRICTURE  02-25-2005   dr Vernie Ammons  [facility]   CYSTO/  DILATION URETHRAL MEATAL STENOSIS AND DILATION BULBOUS STRICTURE  12-21-2004   dr Vonita Moss  [facility]   CYSTO/ RETROGRADE PYELOGRAM/ DILATATION URETHRAL STRICTURE AND PLACEMENT UROLUME STENT  08-04-2011    dr Vernie Ammons  [facility]   CYSTOSCOPY WITH DIRECT VISION INTERNAL URETHROTOMY  07/15/2008   dr Vernie Ammons  [facility]   and UroLume Stent placement   CYSTOSCOPY WITH URETHRAL DILATATION N/A 04/19/2018   Procedure: CYSTOSCOPY WITH BALLOON URETHRAL DILATATION;  Surgeon: Ihor Gully, MD;  Location: [facility];  Service: Urology;  Laterality: N/A;   INCISION AND DRAINAGE Right 08/13/2021   Procedure: INCISION AND DRAINAGE RIGHT FOOT WOUND;  Surgeon: Vivi Barrack, DPM;  Location: [facility];  Service: Podiatry;  Laterality: Right;   KNEE ARTHROSCOPY Left 11/15/2021   Procedure: LEFT KNEE ARTHROSCOPIC IRRIGATION AND DEBRIDEMENT;  Surgeon: Tarry Kos, MD;  Location: [facility];  Service: Orthopedics;  Laterality: Left;   ORIF TOE FRACTURE Left 05/22/2013   Procedure: OPEN REDUCTION INTERNAL FIXATION (ORIF) LEFT SECOND TOE PROXIMAL PHALANX FRACTURE/LEFT PERCUTANEOUS PINNING;  Surgeon: Toni Arthurs, MD;  Location: [facility];  Service: Orthopedics;  Laterality: Left;   TONSILLECTOMY  child   Social History   Socioeconomic History   Marital status: Married  Spouse name: Claiborne Billings   Number of children: 3   Years of education: Not on file   Highest education level: Not on file  Occupational History   Not on file  Tobacco Use   Smoking status: Never   Smokeless tobacco: Never  Vaping Use   Vaping Use: Never used  Substance and Sexual Activity   Alcohol use: Yes    Comment: occasional   Drug use: No   Sexual activity: Yes  Other Topics Concern   Not on file  Social History Narrative   Not on file   Social Determinants of Health   Financial Resource Strain: Not on file  Food Insecurity: Not on  file  Transportation Needs: Not on file  Physical Activity: Not on file  Stress: Not on file  Social Connections: Not on file   Family History  Problem Relation Age of Onset   Cancer Mother        lung   Hypertension Father    CAD Father    CVA Brother    Allergies  Allergen Reactions   Glucophage [Metformin Hcl] Other (See Comments)    Upset stomach   Hydrochlorothiazide Other (See Comments)    Hyponatremia   Prior to Admission medications   Medication Sig Start Date End Date Taking? Authorizing Provider  ALPRAZolam Duanne Moron) 0.5 MG tablet Take 0.5 mg by mouth 2 (two) times daily as needed for anxiety.   Yes [provider]  amLODipine (NORVASC) 10 MG tablet Take 10 mg by mouth at bedtime. 06/14/20  Yes [provider]  aspirin EC 81 MG tablet Take 1 tablet (81 mg total) by mouth in the morning and at bedtime. To be taken after surgery x 6 weeks to prevent blood clots 08/07/22 08/07/23 Yes Aundra Dubin, PA-C  atorvastatin (LIPITOR) 80 MG tablet Take 80 mg by mouth every evening.   Yes [provider]  buPROPion (WELLBUTRIN XL) 300 MG 24 hr tablet Take 300 mg by mouth every morning.    Yes [provider]  fenofibrate 160 MG tablet Take 160 mg by mouth every evening. 07/01/14  Yes [provider]  fish oil-omega-3 fatty acids 1000 MG capsule Take 1 g by mouth daily.    Yes [provider]  HUMALOG MIX 50/50 KWIKPEN (50-50) 100 UNIT/ML Kwikpen Inject 40 Units into the skin 3 (three) times daily before meals. 07/19/20  Yes [provider]  hydrALAZINE (APRESOLINE) 50 MG tablet Take 1 tablet (50 mg total) by mouth 2 (two) times daily. 07/26/22  Yes Belva Crome, MD  metoprolol tartrate (LOPRESSOR) 100 MG tablet Take 100 mg by mouth 2 (two) times daily.   Yes [provider]  naproxen sodium (ALEVE) 220 MG tablet Take 220 mg by mouth 2 (two) times daily with a meal.   Yes [provider]  omeprazole (PRILOSEC)  20 MG capsule Take 20 mg by mouth every evening.    Yes [provider]  ondansetron (ZOFRAN) 4 MG tablet Take 1 tablet (4 mg total) by mouth every 8 (eight) hours as needed for nausea or vomiting. 08/07/22  Yes Stanbery, Mary L, PA-C  OZEMPIC, 1 MG/DOSE, 4 MG/3ML SOPN Inject into the skin once a week. 06/01/22  Yes [provider]  potassium chloride SA (K-DUR,KLOR-CON) 20 MEQ tablet Take 40 mEq by mouth every morning.   Yes [provider]  doxycycline (VIBRA-TABS) 100 MG tablet Take 1 tablet (100 mg total) by mouth 2 (two) times daily. 08/04/22  Vivi Barrack, DPM  oxyCODONE-acetaminophen (PERCOCET) 5-325 MG tablet Take 1-2 tablets by mouth every 6 (six) hours as needed. To be taken after surgery Patient taking differently: Take 1-2 tablets by mouth every 6 (six) hours as needed for moderate pain or severe pain. To be taken after surgery 08/07/22   Cristie Hem, PA-C     Positive ROS: All other systems have been reviewed and were otherwise negative with the exception of those mentioned in the HPI and as above.  Physical Exam: General: Alert, no acute distress Cardiovascular: No pedal edema Respiratory: No cyanosis, no use of accessory musculature GI: abdomen soft Skin: No lesions in the area of chief complaint Neurologic: Sensation intact distally Psychiatric: Patient is competent for consent with normal mood and affect Lymphatic: no lymphedema  MUSCULOSKELETAL: exam stable  Assessment: left knee degenerative joint disease  Plan: Plan for Procedure(s): LEFT TOTAL KNEE ARTHROPLASTY  The risks benefits and alternatives were discussed with the patient including but not limited to the risks of nonoperative treatment, versus surgical intervention including infection, bleeding, nerve injury,  blood clots, cardiopulmonary complications, morbidity, mortality, among others, and they were willing to proceed.   Glee Arvin, MD 08/14/2022 10:44 AM

## 2022-08-14 NOTE — Anesthesia Procedure Notes (Signed)
Spinal  Patient location during procedure: OR Start time: 08/14/2022 12:31 PM End time: 08/14/2022 12:34 PM Reason for block: surgical anesthesia Staffing Performed: anesthesiologist  Anesthesiologist: Brennan Bailey, MD Performed by: Brennan Bailey, MD Authorized by: Brennan Bailey, MD   Preanesthetic Checklist Completed: patient identified, IV checked, risks and benefits discussed, surgical consent, monitors and equipment checked, pre-op evaluation and timeout performed Spinal Block Patient position: sitting Prep: DuraPrep and site prepped and draped Patient monitoring: continuous pulse ox, blood pressure and heart rate Approach: midline Location: L3-4 Injection technique: single-shot Needle Needle type: Pencan  Needle gauge: 24 G Needle length: 9 cm Assessment Events: CSF return Additional Notes Risks, benefits, and alternative discussed. Patient gave consent to procedure. Prepped and draped in sitting position. Patient sedated but responsive to voice. Clear CSF obtained after one needle pass. Positive terminal aspiration. No pain or paraesthesias with injection. Patient tolerated procedure well. Vital signs stable. Tawny Asal, MD

## 2022-08-14 NOTE — Progress Notes (Signed)
PT Cancellation Note  Patient Details Name: [name] [name] MRN: [id number] DOB: 1956/05/06   Cancelled Treatment:    Reason Eval/Treat Not Completed: Other (comment). Pt not yet on the floor.   Hunt 08/14/2022, [place] Office [phone]

## 2022-08-14 NOTE — Progress Notes (Deleted)
PHARMACIST - PHYSICIAN ORDER COMMUNICATION  CONCERNING: Non-formulary insulin lispro protamine & lispro (Humalog 50/50 mix) 40 units TID  DESCRIPTION:  This patient's order for:  insulin Lispro prot & lispro (Humalog 50/50 mix)  has been noted.  This product(s) is not stocked in the pharmacy.    ACTION TAKEN: The pharmacy department has ordered insulin aspart 20 units TID and insulin glargine 60 units daily as an equivalent insulin dose.   Jeneen Rinks, Pharm.D PGY1 Pharmacy Resident 08/14/2022 8:37 PM

## 2022-08-14 NOTE — Anesthesia Procedure Notes (Signed)
Procedure Name: MAC Date/Time: 08/14/2022 12:38 PM  Performed by: Kyung Rudd, CRNAPre-anesthesia Checklist: Patient identified, Emergency Drugs available, Suction available and Patient being monitored Patient Re-evaluated:Patient Re-evaluated prior to induction Oxygen Delivery Method: Simple face mask Induction Type: IV induction Placement Confirmation: positive ETCO2 Dental Injury: Teeth and Oropharynx as per pre-operative assessment

## 2022-08-15 ENCOUNTER — Encounter: Payer: Self-pay | Admitting: Orthopaedic Surgery

## 2022-08-15 ENCOUNTER — Encounter (HOSPITAL_COMMUNITY): Payer: Self-pay | Admitting: Orthopaedic Surgery

## 2022-08-15 DIAGNOSIS — M1712 Unilateral primary osteoarthritis, left knee: Secondary | ICD-10-CM | POA: Diagnosis not present

## 2022-08-15 LAB — CBC
HCT: 31.9 % — ABNORMAL LOW (ref 39.0–52.0)
Hemoglobin: 10.5 g/dL — ABNORMAL LOW (ref 13.0–17.0)
MCH: 27.2 pg (ref 26.0–34.0)
MCHC: 32.9 g/dL (ref 30.0–36.0)
MCV: 82.6 fL (ref 80.0–100.0)
Platelets: 220 10*3/uL (ref 150–400)
RBC: 3.86 MIL/uL — ABNORMAL LOW (ref 4.22–5.81)
RDW: 14.6 % (ref 11.5–15.5)
WBC: 11.6 10*3/uL — ABNORMAL HIGH (ref 4.0–10.5)
nRBC: 0 % (ref 0.0–0.2)

## 2022-08-15 LAB — GLUCOSE, CAPILLARY: Glucose-Capillary: 138 mg/dL — ABNORMAL HIGH (ref 70–99)

## 2022-08-15 NOTE — Anesthesia Postprocedure Evaluation (Signed)
Anesthesia Post Note  Patient: [name] [name]  Procedure(s) Performed: LEFT TOTAL KNEE ARTHROPLASTY (Left: Knee)     Patient location during evaluation: PACU Anesthesia Type: Spinal Level of consciousness: oriented and awake and alert Pain management: pain level controlled Vital Signs Assessment: post-procedure vital signs reviewed and stable Respiratory status: spontaneous breathing, respiratory function stable and patient connected to nasal cannula oxygen Cardiovascular status: blood pressure returned to baseline and stable Postop Assessment: no headache, no backache and no apparent nausea or vomiting Anesthetic complications: no   No notable events documented.  Last Vitals:  Vitals:   08/14/22 2352 08/15/22 0404  BP: 138/74 128/68  Pulse: 78 72  Resp: 19 17  Temp: 36.9 C 36.7 C  SpO2: 98% 98%    Last Pain:  Vitals:   08/14/22 2100  TempSrc:   PainSc: 0-No pain                 Ashmi Blas S

## 2022-08-15 NOTE — Progress Notes (Signed)
Physical Therapy Treatment Patient Details Name: [name] [name] MRN: [id number] DOB: July 14, 1956 Today's Date: 08/15/2022   History of Present Illness 66 y/o male who presents s/p left TKA 08/14/22. PMH includes right great toe ampt, CAD, HTN, CABG, DM, OSA on CPAP, anxiety.    PT Comments    Patient progressing well for the second session reporting no increased pain. Session focused on progressive ambulation, stair training and reviewing HEP handout. Tolerated negotiating steps with supervision for safety and cues for technique. Instructed pt in HEP handout. Reviewed importance of positioning changes, knee precautions, exercise, walking etc. Pt safe to return home with support of spouse. Will follow.    Recommendations for follow up therapy are one component of a multi-disciplinary discharge planning process, led by the attending physician.  Recommendations may be updated based on patient status, additional functional criteria and insurance authorization.  Follow Up Recommendations  Follow physician's recommendations for discharge plan and follow up therapies     Assistance Recommended at Discharge PRN  Patient can return home with the following A little help with walking and/or transfers;A little help with bathing/dressing/bathroom;Help with stairs or ramp for entrance;Assistance with cooking/housework;Assist for transportation   Equipment Recommendations  None recommended by PT    Recommendations for Other Services       Precautions / Restrictions Precautions Precautions: Fall;Knee Precaution Booklet Issued: No Precaution Comments: Reviewed precautions and no pillow under knee Restrictions Weight Bearing Restrictions: Yes LLE Weight Bearing: Weight bearing as tolerated     Mobility  Bed Mobility Overal bed mobility: Modified Independent             General bed mobility comments: No assist needed, HOB elevated.    Transfers Overall transfer level: Modified  independent Equipment used: Rolling walker (2 wheels) Transfers: Sit to/from Stand Sit to Stand: Modified independent (Device/Increase time)           General transfer comment: Stood from EOB x1.    Ambulation/Gait Ambulation/Gait assistance: Supervision Gait Distance (Feet): 300 Feet Assistive device: Rolling walker (2 wheels) Gait Pattern/deviations: Step-through pattern, Decreased stride length Gait velocity: decreased Gait velocity interpretation: 1.31 - 2.62 ft/sec, indicative of limited community ambulator   General Gait Details: Slow, steady gait with good knee range during ambulation, less WB through BUEs, dragging LLE when fatigued   Stairs Stairs: Yes Stairs assistance: Supervision Stair Management: Step to pattern, Two rails Number of Stairs: 5 (x2 bouts) General stair comments: Cues for technique.   Wheelchair Mobility    Modified Rankin (Stroke Patients Only)       Balance Overall balance assessment: Needs assistance Sitting-balance support: Feet supported, No upper extremity supported Sitting balance-Leahy Scale: Good     Standing balance support: During functional activity Standing balance-Leahy Scale: Fair Standing balance comment: Able to stand statically wihtout UE support, needs UE support for walking                            Cognition Arousal/Alertness: Awake/alert Behavior During Therapy: WFL for tasks assessed/performed Overall Cognitive Status: Within Functional Limits for tasks assessed                                          Exercises Total Joint Exercises Ankle Circles/Pumps: AROM, Both, 15 reps, Supine Quad Sets: AROM, Both, 10 reps, Supine Gluteal Sets: AROM, Both, 5 reps, Seated Towel  Squeeze: AROM, Both, 10 reps, Supine Heel Slides: AROM, Left, 10 reps, Supine Hip ABduction/ADduction: AROM, Left, 10 reps, Supine Straight Leg Raises: AROM, Left, 5 reps, Supine Long Arc Quad: AROM, Left, 5  reps, Seated Goniometric ROM: ~0-100 degrees knee AROM    General Comments General comments (skin integrity, edema, etc.): Wife present during session.      Pertinent Vitals/Pain Pain Assessment Pain Assessment: 0-10 Pain Score: 3  Pain Location: left knee Pain Descriptors / Indicators: Sore, Operative site guarding Pain Intervention(s): Monitored during session, Repositioned, Patient requesting pain meds-RN notified    Home Living Family/patient expects to be discharged to:: Private residence Living Arrangements: Spouse/significant other;Children Available Help at Discharge: Family;Available 24 hours/day Type of Home: House Home Access: Ramped entrance       Home Layout: Two level;Able to live on main level with bedroom/bathroom Home Equipment: Tub bench;Wheelchair - Physiological scientist (4 wheels);BSC/3in1;Cane - single point;Standard Environmental consultant      Prior Function            PT Goals (current goals can now be found in the care plan section) Acute Rehab PT Goals Patient Stated Goal: to go home and be able to walk my dog PT Goal Formulation: With patient Time For Goal Achievement: 08/29/22 Potential to Achieve Goals: Good Progress towards PT goals: Progressing toward goals    Frequency    7X/week      PT Plan Current plan remains appropriate    Co-evaluation              AM-PAC PT "6 Clicks" Mobility   Outcome Measure  Help needed turning from your back to your side while in a flat bed without using bedrails?: None Help needed moving from lying on your back to sitting on the side of a flat bed without using bedrails?: None Help needed moving to and from a bed to a chair (including a wheelchair)?: None Help needed standing up from a chair using your arms (e.g., wheelchair or bedside chair)?: None Help needed to walk in hospital room?: A Little Help needed climbing 3-5 steps with a railing? : A Little 6 Click Score: 22    End of Session Equipment Utilized  During Treatment: Gait belt Activity Tolerance: Patient tolerated treatment well Patient left: in bed;with call bell/phone within reach;with family/visitor present Nurse Communication: Mobility status PT Visit Diagnosis: Pain;Other abnormalities of gait and mobility (R26.89) Pain - Right/Left: Left Pain - part of body: Knee     Time: 5364-6803 PT Time Calculation (min) (ACUTE ONLY): 19 min  Charges:  $Gait Training: 8-22 mins                     Vale Haven, PT, DPT Acute Rehabilitation Services Secure chat preferred Office [phone]      Blake Divine A Jennife Zaucha 08/15/2022, 11:52 AM

## 2022-08-15 NOTE — Evaluation (Signed)
Occupational Therapy Evaluation Patient Details Name: [name] [name] MRN: [id number] DOB: [birth year] Today's Date: 08/15/2022   History of Present Illness 66 y/o male who presents s/p left TKA 08/14/22. PMH includes right great toe ampt, CAD, HTN, CABG, DM, OSA on CPAP, anxiety.   Clinical Impression   PTA, pt was living with his wife and was independent. Currently, pt performing at Mod I level for ADLs and functional mobility using RW. Provided education on LB ADLs, toilet transfer, and tub transfer with tub bench; pt demonstrated understanding. Answered all pt questions. Recommend dc home once medically stable per physician. All acute OT needs met and will sign off. Thank you.     Recommendations for follow up therapy are one component of a multi-disciplinary discharge planning process, led by the attending physician.  Recommendations may be updated based on patient status, additional functional criteria and insurance authorization.   Follow Up Recommendations  No OT follow up    Assistance Recommended at Discharge PRN  Patient can return home with the following      Functional Status Assessment  Patient has had a recent decline in their functional status and demonstrates the ability to make significant improvements in function in a reasonable and predictable amount of time.  Equipment Recommendations  Other (comment) (RW)    Recommendations for Other Services       Precautions / Restrictions Precautions Precautions: Fall;Knee Precaution Booklet Issued: No Precaution Comments: Reviewed precautions and no pillow under knee Restrictions Weight Bearing Restrictions: Yes LLE Weight Bearing: Weight bearing as tolerated      Mobility Bed Mobility               General bed mobility comments: In recliner upon arrival    Transfers Overall transfer level: Modified independent Equipment used: Rolling walker (2 wheels) Transfers: Sit to/from Stand             General  transfer comment: Increased time      Balance Overall balance assessment: Needs assistance Sitting-balance support: Feet supported, No upper extremity supported Sitting balance-Leahy Scale: Good     Standing balance support: During functional activity Standing balance-Leahy Scale: Fair                             ADL either performed or assessed with clinical judgement   ADL Overall ADL's : Modified independent                                       General ADL Comments: Educating pt on compensatory techniques for ADLs including dressing, bathing, toileting, and tub transfer     Vision         Perception     Praxis      Pertinent Vitals/Pain Pain Assessment Pain Assessment: 0-10 Pain Score: 3  Pain Location: left knee Pain Descriptors / Indicators: Sore, Operative site guarding Pain Intervention(s): Monitored during session, Repositioned     Hand Dominance Right   Extremity/Trunk Assessment Upper Extremity Assessment Upper Extremity Assessment: Overall WFL for tasks assessed   Lower Extremity Assessment Lower Extremity Assessment: Defer to PT evaluation RLE Deficits / Details: Some neuropathy on plantar surface of foot, big toe amputation RLE Sensation: decreased light touch LLE Deficits / Details: post surgical deficits, good range noted at knee joint LLE Sensation: decreased light touch (medial portion of lower leg)  Cervical / Trunk Assessment Cervical / Trunk Assessment: Normal   Communication Communication Communication: No difficulties   Cognition Arousal/Alertness: Awake/alert Behavior During Therapy: WFL for tasks assessed/performed Overall Cognitive Status: Within Functional Limits for tasks assessed                                       General Comments  Reporting his clothes are still down in PACU. Notified RN    Exercises     Shoulder Instructions      Home Living Family/patient expects to  be discharged to:: Private residence Living Arrangements: Spouse/significant other;Children Available Help at Discharge: Family;Available 24 hours/day Type of Home: House Home Access: Ramped entrance     Home Layout: Two level;Able to live on main level with bedroom/bathroom     Bathroom Shower/Tub: Tub/shower unit;Walk-in shower   Bathroom Toilet: Handicapped height Bathroom Accessibility: Yes   Home Equipment: Tub bench;Wheelchair - manual;Rollator (4 wheels);BSC/3in1;Cane - single point;Standard Environmental consultant          Prior Functioning/Environment Prior Level of Function : Independent/Modified Independent;Working/employed;Driving             Mobility Comments: Using cane after last sx ADLs Comments: independent. Semi retired and owns a Psychologist, clinical Problem List: Decreased activity tolerance;Impaired balance (sitting and/or standing);Decreased knowledge of use of DME or AE;Decreased knowledge of precautions      OT Treatment/Interventions:      OT Goals(Current goals can be found in the care plan section) Acute Rehab OT Goals Patient Stated Goal: Go home OT Goal Formulation: All assessment and education complete, DC therapy  OT Frequency:      Co-evaluation              AM-PAC OT "6 Clicks" Daily Activity     Outcome Measure Help from another person eating meals?: None Help from another person taking care of personal grooming?: None Help from another person toileting, which includes using toliet, bedpan, or urinal?: None Help from another person bathing (including washing, rinsing, drying)?: None Help from another person to put on and taking off regular upper body clothing?: None Help from another person to put on and taking off regular lower body clothing?: None 6 Click Score: 24   End of Session Equipment Utilized During Treatment: Gait belt;Rolling walker (2 wheels) Nurse Communication: Mobility status  Activity Tolerance: Patient tolerated  treatment well Patient left: Other (comment);with call bell/phone within reach (toilet; notified RN)  OT Visit Diagnosis: Other abnormalities of gait and mobility (R26.89);Pain;Muscle weakness (generalized) (M62.81) Pain - Right/Left: Left Pain - part of body: Knee                Time: 8119-1478 OT Time Calculation (min): 22 min Charges:  OT General Charges $OT Visit: 1 Visit OT Evaluation $OT Eval Low Complexity: 1 Low  Arian Murley MSOT, OTR/L Acute Rehab Office: [place] 08/15/2022, 9:10 AM

## 2022-08-15 NOTE — Evaluation (Signed)
Physical Therapy Evaluation Patient Details Name: [name] [name] MRN: [id number] DOB: February 09, 1956 Today's Date: 08/15/2022  History of Present Illness  Patient is a 66 y/o male who presents s/p left TKA 08/14/22. PMH includes right great toe ampt, CAD, HTN, CABG, DM, OSA on CPAP, anxiety.  Clinical Impression  Patient presents with pain and post surgical deficits s/p above surgery. Pt is independent and lives with wife PTA. 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 Today, pt tolerated bed mobility, transfers and gait training with supervision for safety and use of RW for support. Instructed pt in there ex. Knee AROM ~0-100 degrees. Education re: knee positioning, precautions, there ex, walking program, pain management. Will progress HEP and provide handout and increase mobility prior to d/c home. Will follow acutely to maximize independence and mobility prior to return home.       Recommendations for follow up therapy are one component of a multi-disciplinary discharge planning process, led by the attending physician.  Recommendations may be updated based on patient status, additional functional criteria and insurance authorization.  Follow Up Recommendations Follow physician's recommendations for discharge plan and follow up therapies      Assistance Recommended at Discharge PRN  Patient can return home with the following  A little help with walking and/or transfers;A little help with bathing/dressing/bathroom;Help with stairs or ramp for entrance;Assistance with cooking/housework;Assist for transportation    Equipment Recommendations Rolling walker (2 wheels)  Recommendations for Other Services       Functional Status Assessment Patient has had a recent decline in their functional status and demonstrates the ability to make significant improvements in function in a reasonable and predictable amount of time.     Precautions / Restrictions Precautions Precautions:  Fall;Knee Precaution Booklet Issued: No Precaution Comments: Reviewed precautions and no pillow under knee Restrictions Weight Bearing Restrictions: Yes LLE Weight Bearing: Weight bearing as tolerated      Mobility  Bed Mobility Overal bed mobility: Modified Independent             General bed mobility comments: No assist needed, HOB elevated.    Transfers Overall transfer level: Needs assistance Equipment used: Rolling walker (2 wheels) Transfers: Sit to/from Stand Sit to Stand: Supervision           General transfer comment: Supervision for safety. Stood from [employer], cues for hand placement.technique, transferred to chair post ambulation.    Ambulation/Gait Ambulation/Gait assistance: Supervision Gait Distance (Feet): 200 Feet Assistive device: Rolling walker (2 wheels) Gait Pattern/deviations: Step-through pattern, Decreased stride length Gait velocity: decreased Gait velocity interpretation: 1.31 - 2.62 ft/sec, indicative of limited community ambulator   General Gait Details: Slow, steady gait with good knee range during ambulation, increased WB through BUEs  Stairs            Wheelchair Mobility    Modified Rankin (Stroke Patients Only)       Balance Overall balance assessment: Needs assistance Sitting-balance support: Feet supported, No upper extremity supported Sitting balance-Leahy Scale: Good     Standing balance support: During functional activity Standing balance-Leahy Scale: Fair Standing balance comment: Able to stand statically wihtout UE support, needs UE support for walking                             Pertinent Vitals/Pain Pain Assessment Pain Assessment: 0-10 Pain Score: 3  Pain Location: left knee Pain Descriptors / Indicators: Sore, Operative site guarding Pain Intervention(s): Monitored  during session, Repositioned    Home Living Family/patient expects to be discharged to:: Private residence Living  Arrangements: Spouse/significant other;Children Available Help at Discharge: Family;Available 24 hours/day Type of Home: House Home Access: Ramped entrance       Home Layout: Two level;Able to live on main level with bedroom/bathroom Home Equipment: Tub bench;Wheelchair - Forensic psychologist (2 wheels);Rollator (4 wheels);BSC/3in1;Cane - single point      Prior Function Prior Level of Function : Independent/Modified Independent             Mobility Comments: Independent, driving, cooking/cleaning. Own a transmision shop ADLs Comments: independent     Hand Dominance   Dominant Hand: Right    Extremity/Trunk Assessment   Upper Extremity Assessment Upper Extremity Assessment: Defer to OT evaluation    Lower Extremity Assessment Lower Extremity Assessment: LLE deficits/detail;RLE deficits/detail RLE Deficits / Details: Some neuropathy on plantar surface of foot, big toe amputation RLE Sensation: decreased light touch LLE Deficits / Details: post surgical deficits, good range noted at knee joint LLE Sensation: decreased light touch (medial portion of lower leg)    Cervical / Trunk Assessment Cervical / Trunk Assessment: Normal  Communication   Communication: No difficulties  Cognition Arousal/Alertness: Awake/alert Behavior During Therapy: WFL for tasks assessed/performed Overall Cognitive Status: Within Functional Limits for tasks assessed                                          General Comments General comments (skin integrity, edema, etc.): Some drainage at bandage on left knee    Exercises Total Joint Exercises Ankle Circles/Pumps: AROM, Both, 15 reps, Supine Quad Sets: AROM, Both, 10 reps, Supine Gluteal Sets: AROM, Both, 5 reps, Seated Long Arc Quad: AROM, Left, 5 reps, Seated Goniometric ROM: ~0-100 degrees knee AROM   Assessment/Plan    PT Assessment Patient needs continued PT services  PT Problem List Decreased strength;Decreased  range of motion;Decreased mobility;Pain;Impaired sensation;Decreased balance;Decreased skin integrity       PT Treatment Interventions Therapeutic activities;Gait training;Balance training;Therapeutic exercise;Patient/family education;Functional mobility training;DME instruction    PT Goals (Current goals can be found in the Care Plan section)  Acute Rehab PT Goals Patient Stated Goal: to go home and be able to walk my dog PT Goal Formulation: With patient Time For Goal Achievement: 08/29/22 Potential to Achieve Goals: Good    Frequency 7X/week     Co-evaluation               AM-PAC PT "6 Clicks" Mobility  Outcome Measure Help needed turning from your back to your side while in a flat bed without using bedrails?: None Help needed moving from lying on your back to sitting on the side of a flat bed without using bedrails?: None Help needed moving to and from a bed to a chair (including a wheelchair)?: A Little Help needed standing up from a chair using your arms (e.g., wheelchair or bedside chair)?: A Little Help needed to walk in hospital room?: A Little Help needed climbing 3-5 steps with a railing? : A Little 6 Click Score: 20    End of Session Equipment Utilized During Treatment: Gait belt Activity Tolerance: Patient tolerated treatment well Patient left: in chair;with call bell/phone within reach;with nursing/sitter in room Nurse Communication: Mobility status PT Visit Diagnosis: Pain;Other abnormalities of gait and mobility (R26.89) Pain - Right/Left: Left Pain - part of body: Knee  TimeBC:[id number] PT Time Calculation (min) (ACUTE ONLY): 25 min   Charges:   PT Evaluation $PT Eval Low Complexity: 1 Low PT Treatments $Gait Training: 8-22 mins        Zettie Cooley, DPT Acute Rehabilitation Services Secure chat preferred Office [place] 08/15/2022, 8:43 AM

## 2022-08-15 NOTE — Progress Notes (Signed)
Subjective: 1 Day Post-Op Procedure(s) (LRB): LEFT TOTAL KNEE ARTHROPLASTY (Left) Patient reports pain as mild.    Objective: Vital signs in last 24 hours: Temp:  [97.6 F (36.4 C)-98.7 F (37.1 C)] 98 F (36.7 C) (09/26 0404) Pulse Rate:  [65-78] 72 (09/26 0404) Resp:  [10-19] 17 (09/26 0404) BP: (101-146)/(68-84) 128/68 (09/26 0404) SpO2:  [92 %-100 %] 98 % (09/26 0404) Weight:  [110.7 kg] 110.7 kg (09/25 0952)  Intake/Output from previous day: 09/25 0701 - 09/26 0700 In: 1984.6 [I.V.:1584.6; IV Piggyback:400] Out: 750 [Urine:600; Blood:150] Intake/Output this shift: No intake/output data recorded.  Recent Labs    08/15/22 0216  HGB 10.5*   Recent Labs    08/15/22 0216  WBC 11.6*  RBC 3.86*  HCT 31.9*  PLT 220   No results for input(s): "NA", "K", "CL", "CO2", "BUN", "CREATININE", "GLUCOSE", "CALCIUM" in the last 72 hours. No results for input(s): "LABPT", "INR" in the last 72 hours.  Neurologically intact Neurovascular intact Sensation intact distally Intact pulses distally Dorsiflexion/Plantar flexion intact Incision: moderate drainage No cellulitis present Compartment soft   Assessment/Plan: 1 Day Post-Op Procedure(s) (LRB): LEFT TOTAL KNEE ARTHROPLASTY (Left) Advance diet Up with therapy D/C IV fluids Discharge home with home health WBAT LLE ABLA- mild and stable Nurse to change aquacel prior to d/c   Anticipated LOS equal to or greater than 2 midnights due to - Age 25 and older with one or more of the following:  - Obesity  - Expected need for hospital services (PT, OT, Nursing) required for safe  discharge  - Anticipated need for postoperative skilled nursing care or inpatient rehab  - Active co-morbidities: Diabetes and Coronary Artery Disease OR   - Unanticipated findings during/Post Surgery: None  - Patient is a high risk of re-admission due to: None   Aundra Dubin 08/15/2022, 7:08 AM

## 2022-08-15 NOTE — Plan of Care (Signed)
  Problem: Education: Goal: Ability to describe self-care measures that may prevent or decrease complications (Diabetes Survival Skills Education) will improve Outcome: Adequate for Discharge Goal: Individualized Educational Video(s) Outcome: Adequate for Discharge   Problem: Coping: Goal: Ability to adjust to condition or change in health will improve Outcome: Adequate for Discharge   Problem: Fluid Volume: Goal: Ability to maintain a balanced intake and output will improve Outcome: Adequate for Discharge   Problem: Health Behavior/Discharge Planning: Goal: Ability to identify and utilize available resources and services will improve Outcome: Adequate for Discharge Goal: Ability to manage health-related needs will improve Outcome: Adequate for Discharge   Problem: Metabolic: Goal: Ability to maintain appropriate glucose levels will improve Outcome: Adequate for Discharge   Problem: Nutritional: Goal: Maintenance of adequate nutrition will improve Outcome: Adequate for Discharge Goal: Progress toward achieving an optimal weight will improve Outcome: Adequate for Discharge   Problem: Skin Integrity: Goal: Risk for impaired skin integrity will decrease Outcome: Adequate for Discharge   Problem: Tissue Perfusion: Goal: Adequacy of tissue perfusion will improve Outcome: Adequate for Discharge   Problem: Education: Goal: Knowledge of the prescribed therapeutic regimen will improve Outcome: Adequate for Discharge Goal: Individualized Educational Video(s) Outcome: Adequate for Discharge   Problem: Activity: Goal: Ability to avoid complications of mobility impairment will improve Outcome: Adequate for Discharge Goal: Range of joint motion will improve Outcome: Adequate for Discharge   Problem: Clinical Measurements: Goal: Postoperative complications will be avoided or minimized Outcome: Adequate for Discharge   Problem: Pain Management: Goal: Pain level will decrease  with appropriate interventions Outcome: Adequate for Discharge   Problem: Skin Integrity: Goal: Will show signs of wound healing Outcome: Adequate for Discharge   Problem: Education: Goal: Knowledge of General Education information will improve Description: Including pain rating scale, medication(s)/side effects and non-pharmacologic comfort measures Outcome: Adequate for Discharge   Problem: Health Behavior/Discharge Planning: Goal: Ability to manage health-related needs will improve Outcome: Adequate for Discharge   Problem: Clinical Measurements: Goal: Ability to maintain clinical measurements within normal limits will improve Outcome: Adequate for Discharge Goal: Will remain free from infection Outcome: Adequate for Discharge Goal: Diagnostic test results will improve Outcome: Adequate for Discharge Goal: Respiratory complications will improve Outcome: Adequate for Discharge Goal: Cardiovascular complication will be avoided Outcome: Adequate for Discharge   Problem: Activity: Goal: Risk for activity intolerance will decrease Outcome: Adequate for Discharge   Problem: Nutrition: Goal: Adequate nutrition will be maintained Outcome: Adequate for Discharge   Problem: Coping: Goal: Level of anxiety will decrease Outcome: Adequate for Discharge   Problem: Elimination: Goal: Will not experience complications related to bowel motility Outcome: Adequate for Discharge Goal: Will not experience complications related to urinary retention Outcome: Adequate for Discharge   Problem: Pain Managment: Goal: General experience of comfort will improve Outcome: Adequate for Discharge   Problem: Safety: Goal: Ability to remain free from injury will improve Outcome: Adequate for Discharge   Problem: Skin Integrity: Goal: Risk for impaired skin integrity will decrease Outcome: Adequate for Discharge   

## 2022-08-15 NOTE — Discharge Summary (Signed)
Patient ID: [name] [name] MRN: [id number] DOB/AGE: 04/10/1956 66 y.o.  Admit date: 08/14/2022 Discharge date: 08/15/2022  Admission Diagnoses:  Principal Problem:   Primary osteoarthritis of left knee Active Problems:   Status post total left knee replacement   Discharge Diagnoses:  Same  Past Medical History:  Diagnosis Date   Anxiety    Baker's cyst 10/06/2021   Bulbous urethral stricture    chronic--- hx multiple dilatation's and urolume stent's   CAD (coronary artery disease) 04/ 2011  (previous cardiologist-  dr h. Katrinka Blazing , lov 06/ 2016)  currently followed by pcp   03-03-2010  CABG x 5, LIMA to LAD, left radial artery to OM1,SVG to R1, seqSVG to PDA and PLA   Cyanosis 10/06/2021   Depression    ED (erectile dysfunction)    Essential hypertension, benign    GERD (gastroesophageal reflux disease)    History of pancreatitis 2005   Knee effusion 10/06/2021   Mixed hyperlipidemia    OSA on CPAP    not used recently   S/P CABG x 5 03/03/2010   Septic arthritis of knee, left (HCC) 11/11/2021   Type II or unspecified type diabetes mellitus without mention of complication, not stated as uncontrolled    endocrinologist-  dr Sharl Ma    Surgeries: Procedure(s): LEFT TOTAL KNEE ARTHROPLASTY on 08/14/2022   Consultants:   Discharged Condition: Improved  Hospital Course: DELVON CHIPPS is an 66 y.o. male who was admitted 08/14/2022 for operative treatment ofPrimary osteoarthritis of left knee. Patient has severe unremitting pain that affects sleep, daily activities, and work/hobbies. After pre-op clearance the patient was taken to the operating room on 08/14/2022 and underwent  Procedure(s): LEFT TOTAL KNEE ARTHROPLASTY.    Patient was given perioperative antibiotics:  Anti-infectives (From admission, onward)    Start     Dose/Rate Route Frequency Ordered Stop   08/14/22 2000  ceFAZolin (ANCEF) IVPB 2g/100 mL premix        2 g 200 mL/hr over 30 Minutes Intravenous Every 6 hours  08/14/22 1613 08/15/22 0649   08/14/22 1423  vancomycin (VANCOCIN) powder  Status:  Discontinued          As needed 08/14/22 1423 08/14/22 1515   08/14/22 0946  ceFAZolin (ANCEF) 2-4 GM/100ML-% IVPB       Note to Pharmacy: Shanda Bumps M: cabinet override      08/14/22 0946 08/14/22 1303   08/14/22 0945  ceFAZolin (ANCEF) IVPB 2g/100 mL premix        2 g 200 mL/hr over 30 Minutes Intravenous On call to O.R. 08/14/22 0940 08/14/22 1250   08/14/22 0000  cefadroxil (DURICEF) 500 MG capsule        500 mg Oral 2 times daily 08/14/22 1739          Patient was given sequential compression devices, early ambulation, and chemoprophylaxis to prevent DVT.  Patient benefited maximally from hospital stay and there were no complications.    Recent vital signs: Patient Vitals for the past 24 hrs:  BP Temp Temp src Pulse Resp SpO2 Height Weight  08/15/22 0404 128/68 98 F (36.7 C) -- 72 17 98 % -- --  08/14/22 2352 138/74 98.5 F (36.9 C) -- 78 19 98 % -- --  08/14/22 2009 (!) 141/74 98 F (36.7 C) Oral 74 18 100 % -- --  08/14/22 1842 121/68 97.6 F (36.4 C) Oral 73 15 100 % -- --  08/14/22 1815 132/79 97.7 F (36.5 C) -- 73  16 95 % -- --  08/14/22 1800 131/78 -- -- 73 13 98 % -- --  08/14/22 1745 (!) 146/77 -- -- 72 15 98 % -- --  08/14/22 1645 125/72 -- -- 68 12 99 % -- --  08/14/22 1630 126/75 -- -- 65 10 98 % -- --  08/14/22 1615 126/83 -- -- 69 16 100 % -- --  08/14/22 1600 126/75 -- -- 65 12 98 % -- --  08/14/22 1545 123/75 97.7 F (36.5 C) -- 67 19 99 % -- --  08/14/22 1530 114/73 -- -- 71 19 96 % -- --  08/14/22 1521 -- -- -- 70 17 92 % -- --  08/14/22 1518 101/68 97.7 F (36.5 C) -- 69 15 92 % -- --  08/14/22 0952 (!) 144/84 98.7 F (37.1 C) Oral 77 18 100 % 6' (1.829 m) 110.7 kg     Recent laboratory studies:  Recent Labs    08/15/22 0216  WBC 11.6*  HGB 10.5*  HCT 31.9*  PLT 220     Discharge Medications:   Allergies as of 08/15/2022       Reactions    Glucophage [metformin Hcl] Other (See Comments)   Upset stomach   Hydrochlorothiazide Other (See Comments)   Hyponatremia        Medication List     STOP taking these medications    doxycycline 100 MG tablet Commonly known as: VIBRA-TABS   fish oil-omega-3 fatty acids 1000 MG capsule   naproxen sodium 220 MG tablet Commonly known as: ALEVE       TAKE these medications    ALPRAZolam 0.5 MG tablet Commonly known as: XANAX Take 0.5 mg by mouth 2 (two) times daily as needed for anxiety.   amLODipine 10 MG tablet Commonly known as: NORVASC Take 10 mg by mouth at bedtime.   aspirin EC 81 MG tablet Take 1 tablet (81 mg total) by mouth in the morning and at bedtime. To be taken after surgery x 6 weeks to prevent blood clots   atorvastatin 80 MG tablet Commonly known as: LIPITOR Take 80 mg by mouth every evening.   buPROPion 300 MG 24 hr tablet Commonly known as: WELLBUTRIN XL Take 300 mg by mouth every morning.   cefadroxil 500 MG capsule Commonly known as: DURICEF Take 1 capsule (500 mg total) by mouth 2 (two) times daily.   fenofibrate 160 MG tablet Take 160 mg by mouth every evening.   HumaLOG Mix 50/50 KwikPen (50-50) 100 UNIT/ML Kwikpen Generic drug: Insulin Lispro Prot & Lispro Inject 40 Units into the skin 3 (three) times daily before meals.   hydrALAZINE 50 MG tablet Commonly known as: APRESOLINE Take 1 tablet (50 mg total) by mouth 2 (two) times daily.   metoprolol tartrate 100 MG tablet Commonly known as: LOPRESSOR Take 100 mg by mouth 2 (two) times daily.   omeprazole 20 MG capsule Commonly known as: PRILOSEC Take 20 mg by mouth every evening.   ondansetron 4 MG tablet Commonly known as: Zofran Take 1 tablet (4 mg total) by mouth every 8 (eight) hours as needed for nausea or vomiting.   oxyCODONE-acetaminophen 5-325 MG tablet Commonly known as: Percocet Take 1-2 tablets by mouth every 6 (six) hours as needed. To be taken after surgery What  changed: reasons to take this   Ozempic (1 MG/DOSE) 4 MG/3ML Sopn Generic drug: Semaglutide (1 MG/DOSE) Inject into the skin once a week.   potassium chloride SA 20 MEQ tablet Commonly  known as: KLOR-CON M Take 40 mEq by mouth every morning.               Durable Medical Equipment  (From admission, onward)           Start     Ordered   08/14/22 1614  DME Walker rolling  Once       Question Answer Comment  Walker: With 5 Inch Wheels   Patient needs a walker to treat with the following condition Status post left partial knee replacement      08/14/22 1614   08/14/22 1614  DME 3 n 1  Once        08/14/22 1614   08/14/22 1614  DME Bedside commode  Once       Question:  Patient needs a bedside commode to treat with the following condition  Answer:  Status post left partial knee replacement   08/14/22 1614            Diagnostic Studies: DG Knee Left Port  Result Date: 08/14/2022 CLINICAL DATA:  Status post left knee arthroplasty EXAM: PORTABLE LEFT KNEE - 1-2 VIEW COMPARISON:  06/07/2022 FINDINGS: There is interval left knee arthroplasty. There are pockets of air along the anterior aspect. There is possible effusion in suprapatellar bursa. Scattered arterial calcifications are seen. IMPRESSION: Interval left knee arthroplasty. Electronically Signed   By: Elmer Picker M.D.   On: 08/14/2022 16:52   DG Foot Complete Right  Result Date: 08/10/2022 Please see detailed radiograph report in office note.  MYOCARDIAL PERFUSION IMAGING  Result Date: 08/10/2022   The study is normal. The study is low risk.   No ST deviation was noted.   LV perfusion is normal. There is no evidence of ischemia. There is no evidence of infarction.   Left ventricular function is normal. Nuclear stress EF: 63 %. The left ventricular ejection fraction is normal (55-65%). End diastolic cavity size is normal. End systolic cavity size is normal.   Prior study available for comparison from  02/16/2010. There are changes compared to prior study which appear to be improved. Comparison to report only.    Disposition: Discharge disposition: 01-Home or Self Care          Follow-up Information     Leandrew Koyanagi, MD. Schedule an appointment as soon as possible for a visit in 2 week(s).   Specialty: Orthopedic Surgery Contact information: [address] [place] [address] [phone]                  Signed: Aundra Dubin 08/15/2022, 7:09 AM

## 2022-08-15 NOTE — Progress Notes (Signed)
Discharge instructions reviewed with pt and his wife.  Copy of instructions given to pt. Informed script sent to his pharmacy. Pt and wife verbalized understanding of instructions. "Ice man" machine sent home with pt.  Pt to be d/c'd via wheelchair with belongings, with wife.        Escorted by staff.

## 2022-08-15 NOTE — TOC Transition Note (Signed)
Transition of Care [facility]) - CM/SW Discharge Note   Patient Details  Name: [name] [name] MRN: [id number] Date of Birth: 11/15/1956  Transition of Care [facility]) CM/SW Contact:  Sharin Mons, RN Phone Number: 08/15/2022, 10:55 AM   Clinical Narrative:     Patient will DC to: home Anticipated DC date: 08/15/2022 Family notified: yes Transport by: car        - s/p left [facility]KA 08/14/22 Per MD patient ready for DC today .RN, patient, patient's family, and [place] notified of DC.   Pt without DME needs, Post hospital f/u noted on AVS. Pt without Rx med concerns. Wife to provide transportation to home.  RNCM will sign off for now as intervention is no longer needed. Please consult [place] again if new needs arise.   Final next level of care: Home w Home Health Services Barriers to Discharge: No Barriers Identified   Patient Goals and CMS Choice     Choice offered to / list presented to : Patient  Discharge Placement                       Discharge Plan and Services                DME Arranged:  (no DME needs, already with RW and 3in1/BSC at home)         [facility] Arranged: PT HH Agency: [place] Date [place]: 08/15/22 Time [place]: 1053 Representative spoke with at [place]: [place] Determinants of Health ([place]) Interventions     Readmission Risk Interventions     No data to display

## 2022-08-18 ENCOUNTER — Ambulatory Visit: Payer: 59 | Admitting: Podiatry

## 2022-08-22 ENCOUNTER — Encounter: Payer: Self-pay | Admitting: Orthopaedic Surgery

## 2022-08-23 ENCOUNTER — Ambulatory Visit: Payer: 59

## 2022-08-24 ENCOUNTER — Ambulatory Visit (INDEPENDENT_AMBULATORY_CARE_PROVIDER_SITE_OTHER): Payer: 59 | Admitting: Podiatry

## 2022-08-24 ENCOUNTER — Ambulatory Visit (INDEPENDENT_AMBULATORY_CARE_PROVIDER_SITE_OTHER): Payer: 59

## 2022-08-24 DIAGNOSIS — L97512 Non-pressure chronic ulcer of other part of right foot with fat layer exposed: Secondary | ICD-10-CM

## 2022-08-24 NOTE — Progress Notes (Signed)
Changed Aquacell bandage for patient.

## 2022-08-24 NOTE — Progress Notes (Signed)
Subjective: Chief Complaint  Patient presents with   Diabetic Ulcer    Diabetic ulcer A1c- 5.9 BG-120, right foot,     66 y.o. male presents with the above concerns.  States he is doing better.  No drainage or pus reports.  No new lesions.  No fevers or chills  The surgery 2 weeks ago.  A1c 6.2 on 06/07/2022  Objective: AAO x3, NAD DP/PT pulses palpable bilaterally, CRT less than 3 seconds On the right foot submetatarsal 2 is hyperkeratotic tissue there is a superficial granular wound present measuring 0.5 x 0.3 x 0.1 cm without any probing, undermining or tunneling.  There is no surrounding erythema, ascending cellulitis.  There is no fluctuation or crepitation.  There is no malodor. No pain with calf compression, swelling, warmth, erythema  Assessment: Ulceration right foot, prominent metatarsal head, history of partial first amputation  Plan: -All treatment options discussed with the patient including all alternatives, risks, complications.  -X-rays obtained reviewed.  No evidence of acute fracture or osteomyelitis.  Previous partial first ray potation. -I sharply debrided the hyperkeratotic lesion to reveal the underlying ulceration today.  I utilized a #312 with scalpel to debride nonviable devitalized tissue to promote wound healing.  Debrided the wound and healthy, granular tissue.  Not able to measure the wound prior to debridement but post wound measurements are noted above.  No blood loss.  Clean with saline.  Silvadene was applied followed by dressing.  Recommended to continue with daily dressing changes and offloading.  He has a surgical shoe at home that I like for him to wear. -Patient encouraged to call the office with any questions, concerns, change in symptoms.   Trula Slade DPM

## 2022-08-29 ENCOUNTER — Encounter: Payer: Self-pay | Admitting: Orthopaedic Surgery

## 2022-08-29 ENCOUNTER — Ambulatory Visit (INDEPENDENT_AMBULATORY_CARE_PROVIDER_SITE_OTHER): Payer: 59 | Admitting: Orthopaedic Surgery

## 2022-08-29 DIAGNOSIS — M1712 Unilateral primary osteoarthritis, left knee: Secondary | ICD-10-CM

## 2022-08-29 NOTE — Progress Notes (Signed)
Post-Op Visit Note   Patient: [name] [name]           Date of Birth: Sep 16, 1956           MRN: [id number] Visit Date: 08/29/2022 PCP: Antony Contras, MD   Assessment & Plan:  Chief Complaint:  Chief Complaint  Patient presents with   Left Knee - Follow-up    Left knee arthroplasty 08/14/2022   Visit Diagnoses:  1. Primary osteoarthritis of left knee     Plan: [name] [name] is 2 weeks status post left total knee replacement on 08/14/2022.  He is doing really well and has cleared home health PT.  He is not using any devices for ambulation.  He states that his range of motion has been 0 to 106 degrees.  Examination of the left knee shows a healed surgical incision.  Expected postoperative swelling.  Range of motion is excellent.  No varus valgus instability.  [name] [name] has been doing really well from the surgery.  He will finish out his oral antibiotics as prescribed.  Referral has been made to outpatient PT at our office.  Dental prophylaxis reinforced.  He may go back to the full-strength aspirin daily at this point.  Recheck in 4 weeks with two-view x-rays of the left knee.  Follow-Up Instructions: Return in about 4 weeks (around 09/26/2022).   Orders:  Orders Placed This Encounter  Procedures   Ambulatory referral to Physical Therapy   No orders of the defined types were placed in this encounter.   Imaging: No results found.  PMFS History: Patient Active Problem List   Diagnosis Date Noted   Status post total left knee replacement 08/14/2022   Primary osteoarthritis of left knee 04/07/2022   Effusion, left knee 04/07/2022   Septic arthritis of knee, left ([place]) 11/11/2021   Mass of lower leg, left 11/02/2021   Amputation of right great toe ([place]) 10/31/2021   Anxiety 10/31/2021   Chronic kidney disease, stage 3b ([place]) 10/31/2021   Diabetic renal disease ([place]) 10/31/2021   Gastroesophageal reflux disease without esophagitis 10/31/2021   History of amputation of great toe ([place])  10/31/2021   Hyperglycemia due to type 2 diabetes mellitus ([place]) 10/31/2021   Leukocytosis 10/31/2021   Long term (current) use of insulin ([place]) 10/31/2021   Malignant hypertensive chronic kidney disease 10/31/2021   Male hypogonadism 10/31/2021   Mass of left adrenal gland ([place]) 10/31/2021   Obstructive sleep apnea syndrome 10/31/2021   Other specified disorders of bladder 10/31/2021   Polyneuropathy due to type 2 diabetes mellitus ([place]) 10/31/2021   Baker's cyst, left 10/06/2021   Knee effusion, left 10/06/2021   Cyanosis 10/06/2021   Hyponatremia    AKI (acute kidney injury) ([place])    Elevated LFTs    Post-operative state    Cellulitis of left lower extremity    Streptococcal bacteremia    Cellulitis of right lower extremity    Osteomyelitis of great toe of right foot (HCC)    Osteomyelitis of second toe of right foot ([place])    Diabetes mellitus due to underlying condition with diabetic autonomic neuropathy, without long-term current use of insulin (HCC)    Class 3 severe obesity with serious comorbidity and body mass index (BMI) of 45.0 to 49.9 in adult [facility])    Toe infection 08/08/2021   CAD (coronary artery disease) of artery bypass graft 04/21/2015   Hyperlipidemia 04/21/2015   Essential hypertension 04/21/2015   Type 2 diabetes mellitus ([place]) 04/21/2015   Ulcer of foot (  [place]) 08/22/2014   Past Medical History:  Diagnosis Date   Anxiety    Baker's cyst 10/06/2021   Bulbous urethral stricture    chronic--- hx multiple dilatation's and urolume stent's   CAD (coronary artery disease) 04/ 2011  (previous cardiologist-  dr h. Tamala Julian , lov 06/ 2016)  currently followed by pcp   03-03-2010  CABG x 5, LIMA to LAD, left radial artery to OM1,SVG to R1, seqSVG to PDA and PLA   Cyanosis 10/06/2021   Depression    ED (erectile dysfunction)    Essential hypertension, benign    GERD (gastroesophageal reflux disease)    History of pancreatitis 2005   Knee effusion 10/06/2021   Mixed  hyperlipidemia    OSA on CPAP    not used recently   S/P CABG x 5 03/03/2010   Septic arthritis of knee, left ([place]) 11/11/2021   Type II or unspecified type diabetes mellitus without mention of complication, not stated as uncontrolled    endocrinologist-  dr Buddy Duty    Family History  Problem Relation Age of Onset   Cancer Mother        lung   Hypertension Father    CAD Father    CVA Brother     Past Surgical History:  Procedure Laterality Date   AMPUTATION Right 08/09/2021   Procedure: AMPUTATION DIGIT;  Surgeon: Trula Slade, DPM;  Location: [facility];  Service: Podiatry;  Laterality: Right;   CARDIAC CATHETERIZATION  03-01-2010   dr Daneen Schick   nuclear stress test --positive moderate ishemia:  severe multivessel CAD w/ total occlusion of pLAD, HG obstrucion of pPDA with supplies collaterals around the apex to the LAD, severe stenosis distal RCA, total occlusion OM1, 75% D1, normal LVF    CORONARY ARTERY BYPASS GRAFT  03-03-2010  dr hendrickson    LIMA to LAD, SVG to RI, left radial artery to OM1, seqSVG  to PDA and PLA   CYST EXCISION Left 11/02/2021   Procedure: REMOVAL OF LEFT CALF MASS;  Surgeon: Leandrew Koyanagi, MD;  Location: [place];  Service: Orthopedics;  Laterality: Left;   CYSTO W/ DILATATION , LASER INCISION OF URETHRAL STRICTURE  02-25-2005   dr Karsten Ro  [facility]   CYSTO/  DILATION URETHRAL MEATAL STENOSIS AND DILATION BULBOUS STRICTURE  12-21-2004   dr Terance Hart  [facility]   CYSTO/ RETROGRADE PYELOGRAM/ DILATATION URETHRAL STRICTURE AND PLACEMENT UROLUME STENT  08-04-2011    dr Karsten Ro  [facility]   CYSTOSCOPY WITH DIRECT VISION INTERNAL URETHROTOMY  07/15/2008   dr Karsten Ro  [facility]   and UroLume Stent placement   CYSTOSCOPY WITH URETHRAL DILATATION N/A 04/19/2018   Procedure: CYSTOSCOPY WITH BALLOON URETHRAL DILATATION;  Surgeon: Kathie Rhodes, MD;  Location: [facility];  Service: Urology;  Laterality: N/A;   INCISION AND DRAINAGE Right 08/13/2021    Procedure: INCISION AND DRAINAGE RIGHT FOOT WOUND;  Surgeon: Trula Slade, DPM;  Location: [facility];  Service: Podiatry;  Laterality: Right;   KNEE ARTHROSCOPY Left 11/15/2021   Procedure: LEFT KNEE ARTHROSCOPIC IRRIGATION AND DEBRIDEMENT;  Surgeon: Leandrew Koyanagi, MD;  Location: [place];  Service: Orthopedics;  Laterality: Left;   ORIF TOE FRACTURE Left 05/22/2013   Procedure: OPEN REDUCTION INTERNAL FIXATION (ORIF) LEFT SECOND TOE PROXIMAL PHALANX FRACTURE/LEFT PERCUTANEOUS PINNING;  Surgeon: Wylene Simmer, MD;  Location: [place];  Service: Orthopedics;  Laterality: Left;   TONSILLECTOMY  child   TOTAL KNEE ARTHROPLASTY Left 08/14/2022   Procedure: LEFT TOTAL KNEE  ARTHROPLASTY;  Surgeon: Leandrew Koyanagi, MD;  Location: [place];  Service: Orthopedics;  Laterality: Left;   Social History   Occupational History   Not on file  Tobacco Use   Smoking status: Never   Smokeless tobacco: Never  Vaping Use   Vaping Use: Never used  Substance and Sexual Activity   Alcohol use: Yes    Comment: occasional   Drug use: No   Sexual activity: Yes

## 2022-09-01 NOTE — Therapy (Signed)
OUTPATIENT PHYSICAL THERAPY LOWER EXTREMITY EVALUATION   Patient Name: [name] [name] MRN: [id number] DOB:09-15-1956, 66 y.o., male Today's Date: 09/01/2022    Past Medical History:  Diagnosis Date   Anxiety    Baker's cyst 10/06/2021   Bulbous urethral stricture    chronic--- hx multiple dilatation's and urolume stent's   CAD (coronary artery disease) 04/ 2011  (previous cardiologist-  dr h. Tamala Julian , lov 06/ 2016)  currently followed by pcp   03-03-2010  CABG x 5, LIMA to LAD, left radial artery to OM1,SVG to R1, seqSVG to PDA and PLA   Cyanosis 10/06/2021   Depression    ED (erectile dysfunction)    Essential hypertension, benign    GERD (gastroesophageal reflux disease)    History of pancreatitis 2005   Knee effusion 10/06/2021   Mixed hyperlipidemia    OSA on CPAP    not used recently   S/P CABG x 5 03/03/2010   Septic arthritis of knee, left ([place]) 11/11/2021   Type II or unspecified type diabetes mellitus without mention of complication, not stated as uncontrolled    endocrinologist-  dr Buddy Duty   Past Surgical History:  Procedure Laterality Date   AMPUTATION Right 08/09/2021   Procedure: AMPUTATION DIGIT;  Surgeon: Trula Slade, DPM;  Location: [facility];  Service: Podiatry;  Laterality: Right;   CARDIAC CATHETERIZATION  03-01-2010   dr Daneen Schick   nuclear stress test --positive moderate ishemia:  severe multivessel CAD w/ total occlusion of pLAD, HG obstrucion of pPDA with supplies collaterals around the apex to the LAD, severe stenosis distal RCA, total occlusion OM1, 75% D1, normal LVF    CORONARY ARTERY BYPASS GRAFT  03-03-2010  dr hendrickson    LIMA to LAD, SVG to RI, left radial artery to OM1, seqSVG  to PDA and PLA   CYST EXCISION Left 11/02/2021   Procedure: REMOVAL OF LEFT CALF MASS;  Surgeon: Leandrew Koyanagi, MD;  Location: [facility];  Service: Orthopedics;  Laterality: Left;   CYSTO W/ DILATATION , LASER INCISION OF URETHRAL STRICTURE   02-25-2005   dr Karsten Ro  [facility]   CYSTO/  DILATION URETHRAL MEATAL STENOSIS AND DILATION BULBOUS STRICTURE  12-21-2004   dr Terance Hart  [facility]   CYSTO/ RETROGRADE PYELOGRAM/ DILATATION URETHRAL STRICTURE AND PLACEMENT UROLUME STENT  08-04-2011    dr Karsten Ro  [facility]   CYSTOSCOPY WITH DIRECT VISION INTERNAL URETHROTOMY  07/15/2008   dr Karsten Ro  [facility]   and UroLume Stent placement   CYSTOSCOPY WITH URETHRAL DILATATION N/A 04/19/2018   Procedure: CYSTOSCOPY WITH BALLOON URETHRAL DILATATION;  Surgeon: Kathie Rhodes, MD;  Location: [facility];  Service: Urology;  Laterality: N/A;   INCISION AND DRAINAGE Right 08/13/2021   Procedure: INCISION AND DRAINAGE RIGHT FOOT WOUND;  Surgeon: Trula Slade, DPM;  Location: [facility];  Service: Podiatry;  Laterality: Right;   KNEE ARTHROSCOPY Left 11/15/2021   Procedure: LEFT KNEE ARTHROSCOPIC IRRIGATION AND DEBRIDEMENT;  Surgeon: Leandrew Koyanagi, MD;  Location: [facility];  Service: Orthopedics;  Laterality: Left;   ORIF TOE FRACTURE Left 05/22/2013   Procedure: OPEN REDUCTION INTERNAL FIXATION (ORIF) LEFT SECOND TOE PROXIMAL PHALANX FRACTURE/LEFT PERCUTANEOUS PINNING;  Surgeon: Wylene Simmer, MD;  Location: [place];  Service: Orthopedics;  Laterality: Left;   TONSILLECTOMY  child   TOTAL KNEE ARTHROPLASTY Left 08/14/2022   Procedure: LEFT TOTAL KNEE ARTHROPLASTY;  Surgeon: Leandrew Koyanagi, MD;  Location: [facility];  Service: Orthopedics;  Laterality: Left;  Patient Active Problem List   Diagnosis Date Noted   Status post total left knee replacement 08/14/2022   Primary osteoarthritis of left knee 04/07/2022   Effusion, left knee 04/07/2022   Septic arthritis of knee, left ([place]) 11/11/2021   Mass of lower leg, left 11/02/2021   Amputation of right great toe ([place]) 10/31/2021   Anxiety 10/31/2021   Chronic kidney disease, stage 3b ([place]) 10/31/2021   Diabetic renal disease ([place]) 10/31/2021   Gastroesophageal reflux disease without esophagitis 10/31/2021   History of  amputation of great toe ([place]) 10/31/2021   Hyperglycemia due to type 2 diabetes mellitus ([place]) 10/31/2021   Leukocytosis 10/31/2021   Long term (current) use of insulin ([place]) 10/31/2021   Malignant hypertensive chronic kidney disease 10/31/2021   Male hypogonadism 10/31/2021   Mass of left adrenal gland ([place]) 10/31/2021   Obstructive sleep apnea syndrome 10/31/2021   Other specified disorders of bladder 10/31/2021   Polyneuropathy due to type 2 diabetes mellitus ([place]) 10/31/2021   Baker's cyst, left 10/06/2021   Knee effusion, left 10/06/2021   Cyanosis 10/06/2021   Hyponatremia    AKI (acute kidney injury) ([place])    Elevated LFTs    Post-operative state    Cellulitis of left lower extremity    Streptococcal bacteremia    Cellulitis of right lower extremity    Osteomyelitis of great toe of right foot (HCC)    Osteomyelitis of second toe of right foot ([place])    Diabetes mellitus due to underlying condition with diabetic autonomic neuropathy, without long-term current use of insulin (HCC)    Class 3 severe obesity with serious comorbidity and body mass index (BMI) of 45.0 to 49.9 in adult [facility])    Toe infection 08/08/2021   CAD (coronary artery disease) of artery bypass graft 04/21/2015   Hyperlipidemia 04/21/2015   Essential hypertension 04/21/2015   Type 2 diabetes mellitus ([place]) 04/21/2015   Ulcer of foot ([place]) 08/22/2014    PCP: ***  REFERRING PROVIDER: ***  REFERRING DIAG: ***  THERAPY DIAG:  No diagnosis found.  Rationale for Evaluation and Treatment {HABREHAB:27488}  ONSET DATE: ***  SUBJECTIVE:   SUBJECTIVE STATEMENT: ***  PERTINENT HISTORY: ***  PAIN:  NPRS scale: ***/10 Pain location: *** Pain description: *** Aggravating factors: *** Relieving factors: ***  PRECAUTIONS: {Therapy precautions:24002}  WEIGHT BEARING RESTRICTIONS {Yes ***/No:24003}  FALLS:  Has patient fallen in last 6 months? {fallsyesno:27318}  LIVING ENVIRONMENT: Lives with:  {OPRC lives with:25569::"lives with their family"} Lives in: {Lives in:25570} Stairs: {opstairs:27293} Has following equipment at home: {Assistive devices:23999}  OCCUPATION: ***  PLOF: Independent  PATIENT GOALS ***   OBJECTIVE:   DIAGNOSTIC FINDINGS: ***  PATIENT SURVEYS:  FOTO intake:   predicted:    COGNITION:  Overall cognitive status: WFL    SENSATION: {sensation:27233}  EDEMA:  {edema:24020}  MUSCLE LENGTH: Hamstrings: Right *** deg; Left *** deg Thomas test: Right *** deg; Left *** deg  POSTURE: {posture:25561}  PALPATION: ***  LOWER EXTREMITY ROM:  {AROM/PROM:27142} ROM Right eval Left eval  Hip flexion    Hip extension    Hip abduction    Hip adduction    Hip internal rotation    Hip external rotation    Knee flexion    Knee extension    Ankle dorsiflexion    Ankle plantarflexion    Ankle inversion    Ankle eversion     (Blank rows = not tested)  LOWER EXTREMITY MMT:  MMT Right eval Left eval  Hip  flexion    Hip extension    Hip abduction    Hip adduction    Hip internal rotation    Hip external rotation    Knee flexion    Knee extension    Ankle dorsiflexion    Ankle plantarflexion    Ankle inversion    Ankle eversion     (Blank rows = not tested)  LOWER EXTREMITY SPECIAL TESTS:  {LEspecialtests:26242}  FUNCTIONAL TESTS:  {Functional tests:24029}  GAIT: Distance walked: *** Assistive device utilized: {Assistive devices:23999} Level of assistance: {Levels of assistance:24026} Comments: ***   TODAY'S TREATMENT: Therex:    HEP instruction/performance c cues for techniques, handout provided.  Trial set performed of each for comprehension and symptom assessment.  See below for exercise list    PATIENT EDUCATION:  Education details: HEP, POC Person educated: Patient Education method: Explanation, Demonstration, Verbal cues, and Handouts Education comprehension: verbalized understanding, returned demonstration, and  verbal cues required    HOME EXERCISE PROGRAM: ***  ASSESSMENT:  CLINICAL IMPRESSION: Patient is a *** y.o. who comes to clinic with complaints of ***pain with mobility, strength and movement coordination deficits that impair their ability to perform usual daily and recreational functional activities without increase difficulty/symptoms at this time.  Patient to benefit from skilled PT services to address impairments and limitations to improve to previous level of function without restriction secondary to condition.     OBJECTIVE IMPAIRMENTS {opptimpairments:25111}.   ACTIVITY LIMITATIONS {activitylimitations:27494}  PARTICIPATION LIMITATIONS: {participationrestrictions:25113}  PERSONAL FACTORS {Personal factors:25162} are also affecting patient's functional outcome.   REHAB POTENTIAL: {rehabpotential:25112}  CLINICAL DECISION MAKING: {clinical decision making:25114}  EVALUATION COMPLEXITY: {Evaluation complexity:25115}   GOALS: Goals reviewed with patient? Yes  Short term PT Goals (target date for Short term goals are 3 weeks ***) Patient will demonstrate independent use of home exercise program to maintain progress from in clinic treatments. Goal status: New   Long term PT goals (target dates for all long term goals are 10 weeks  *** )   1. Patient will demonstrate/report pain at worst less than or equal to 2/10 to facilitate minimal limitation in daily activity secondary to pain symptoms. Goal status: New   2. Patient will demonstrate independent use of home exercise program to facilitate ability to maintain/progress functional gains from skilled physical therapy services. Goal status: New   3. Patient will demonstrate FOTO outcome > or = *** % to indicate reduced disability due to condition. Goal status: New   4.  Patient will demonstrate *** LE MMT 5/5 throughout to faciltiate usual transfers, stairs, squatting at [facility] for daily life.   Goal status: New   5.   ***   Goal status: New   6.  ***  Goal status: New   7.  *** a.  Goal Status: New    PLAN:  PT FREQUENCY: 1-2x/week  PT DURATION: 10 weeks  PLANNED INTERVENTIONS: Therapeutic exercises, Therapeutic activity, Neuro Muscular re-education, Balance training, Gait training, Patient/Family education, Joint mobilization, Stair training, DME instructions, Dry Needling, Electrical stimulation, Traction, Cryotherapy, Moist heat, Taping, Ultrasound, Ionotophoresis 4mg /ml Dexamethasone, and Manual therapy.  All included unless contraindicated   PLAN FOR NEXT SESSION: Review HEP knowledge/results.    Scot Jun, PT, DPT, OCS, ATC 09/01/22  11:24 AM

## 2022-09-06 ENCOUNTER — Other Ambulatory Visit: Payer: Self-pay

## 2022-09-06 ENCOUNTER — Encounter: Payer: Self-pay | Admitting: Rehabilitative and Restorative Service Providers"

## 2022-09-06 ENCOUNTER — Ambulatory Visit: Payer: 59 | Admitting: Rehabilitative and Restorative Service Providers"

## 2022-09-06 DIAGNOSIS — M25662 Stiffness of left knee, not elsewhere classified: Secondary | ICD-10-CM

## 2022-09-06 DIAGNOSIS — M6281 Muscle weakness (generalized): Secondary | ICD-10-CM | POA: Diagnosis not present

## 2022-09-06 DIAGNOSIS — M25562 Pain in left knee: Secondary | ICD-10-CM

## 2022-09-06 DIAGNOSIS — G8929 Other chronic pain: Secondary | ICD-10-CM

## 2022-09-06 DIAGNOSIS — R262 Difficulty in walking, not elsewhere classified: Secondary | ICD-10-CM | POA: Diagnosis not present

## 2022-09-06 DIAGNOSIS — R600 Localized edema: Secondary | ICD-10-CM

## 2022-09-07 ENCOUNTER — Encounter: Payer: Self-pay | Admitting: Orthopaedic Surgery

## 2022-09-08 ENCOUNTER — Other Ambulatory Visit: Payer: Self-pay | Admitting: Physician Assistant

## 2022-09-08 MED ORDER — OXYCODONE-ACETAMINOPHEN 5-325 MG PO TABS: 1.0000 | ORAL_TABLET | Freq: Two times a day (BID) | ORAL | 0 refills | Status: DC | PRN

## 2022-09-08 NOTE — Telephone Encounter (Signed)
Refill sent.

## 2022-09-11 ENCOUNTER — Encounter: Payer: 59 | Admitting: Rehabilitative and Restorative Service Providers"

## 2022-09-14 ENCOUNTER — Ambulatory Visit (INDEPENDENT_AMBULATORY_CARE_PROVIDER_SITE_OTHER): Payer: 59 | Admitting: Podiatry

## 2022-09-14 DIAGNOSIS — L97512 Non-pressure chronic ulcer of other part of right foot with fat layer exposed: Secondary | ICD-10-CM | POA: Diagnosis not present

## 2022-09-14 DIAGNOSIS — M216X1 Other acquired deformities of right foot: Secondary | ICD-10-CM

## 2022-09-17 NOTE — Progress Notes (Signed)
Subjective: Chief Complaint  Patient presents with   Diabetic Ulcer    Diabetic right foot ulcer, A1c- 6.2 BG- 115 Patient denies any pain or drainage     66 y.o. male presents with the above concerns.  He still been keeping a bandage on it daily.  States it is doing better.  No drainage or pus he reports.  No recent injury or changes.    Also states that the left knee surgery is doing well and he is walking better.    A1c 6.2 on 06/07/2022  Objective: AAO x3, NAD DP/PT pulses palpable bilaterally, CRT less than 3 seconds On the right foot submetatarsal 2 is a hyperkeratotic lesion.  Once I debrided this today there was still a small superficial area of dried blood on the central aspect.  There is no probing, no tunneling.  No drainage or pus.  No fluctuation or crepitation but there is malodor.  No signs of infection. No pain with calf compression, swelling, warmth, erythema     Assessment: Ulceration right foot, prominent metatarsal head, history of partial first amputation  Plan: -All treatment options discussed with the patient including all alternatives, risks, complications.  -I sharply debrided the hyperkeratotic lesion daily and complications of bleeding.  No signs of infection.  Plan continue with daily dressing changes, offloading.  Monitor closely for any signs or symptoms of infection, able to appreciate any today.  Return in about 3 weeks (around 10/05/2022).  Trula Slade DPM

## 2022-09-20 ENCOUNTER — Encounter: Payer: Self-pay | Admitting: Rehabilitative and Restorative Service Providers"

## 2022-09-20 ENCOUNTER — Ambulatory Visit (INDEPENDENT_AMBULATORY_CARE_PROVIDER_SITE_OTHER): Payer: 59 | Admitting: Rehabilitative and Restorative Service Providers"

## 2022-09-20 DIAGNOSIS — M6281 Muscle weakness (generalized): Secondary | ICD-10-CM | POA: Diagnosis not present

## 2022-09-20 DIAGNOSIS — M25662 Stiffness of left knee, not elsewhere classified: Secondary | ICD-10-CM

## 2022-09-20 DIAGNOSIS — G8929 Other chronic pain: Secondary | ICD-10-CM

## 2022-09-20 DIAGNOSIS — R262 Difficulty in walking, not elsewhere classified: Secondary | ICD-10-CM | POA: Diagnosis not present

## 2022-09-20 DIAGNOSIS — M25562 Pain in left knee: Secondary | ICD-10-CM | POA: Diagnosis not present

## 2022-09-20 DIAGNOSIS — R600 Localized edema: Secondary | ICD-10-CM

## 2022-09-20 NOTE — Therapy (Signed)
OUTPATIENT PHYSICAL THERAPY TREATMENT   Patient Name: [name] [name] MRN: [id number] DOB:04-Aug-1956, 67 y.o., male Today's Date: 09/20/2022  PCP: Antony Contras MD  REFERRING PROVIDER: Leandrew Koyanagi, MD  END OF SESSION:   PT End of Session - 09/20/22 0903     Visit Number 2    Number of Visits 20    Date for PT Re-Evaluation 11/15/22    Authorization Type [employer] $[id number] copay - 73 remain    Authorization - Visit Number 2    Authorization - Number of Visits 18    Progress Note Due on Visit 10    PT Start Time 0924    PT Stop Time 1004    PT Time Calculation (min) 40 min    Activity Tolerance Patient tolerated treatment well    Behavior During Therapy [facility] for tasks assessed/performed              Past Medical History:  Diagnosis Date   Anxiety    Baker's cyst 10/06/2021   Bulbous urethral stricture    chronic--- hx multiple dilatation's and urolume stent's   CAD (coronary artery disease) 04/ 2011  (previous cardiologist-  dr h. Tamala Julian , lov 06/ 2016)  currently followed by pcp   03-03-2010  CABG x 5, LIMA to LAD, left radial artery to OM1,SVG to R1, seqSVG to PDA and PLA   Cyanosis 10/06/2021   Depression    ED (erectile dysfunction)    Essential hypertension, benign    GERD (gastroesophageal reflux disease)    History of pancreatitis 2005   Knee effusion 10/06/2021   Mixed hyperlipidemia    OSA on CPAP    not used recently   S/P CABG x 5 03/03/2010   Septic arthritis of knee, left ([place]) 11/11/2021   Type II or unspecified type diabetes mellitus without mention of complication, not stated as uncontrolled    endocrinologist-  dr Buddy Duty   Past Surgical History:  Procedure Laterality Date   AMPUTATION Right 08/09/2021   Procedure: AMPUTATION DIGIT;  Surgeon: Trula Slade, DPM;  Location: [facility];  Service: Podiatry;  Laterality: Right;   CARDIAC CATHETERIZATION  03-01-2010   dr Daneen Schick   nuclear stress test --positive moderate ishemia:  severe multivessel CAD w/  total occlusion of pLAD, HG obstrucion of pPDA with supplies collaterals around the apex to the LAD, severe stenosis distal RCA, total occlusion OM1, 75% D1, normal LVF    CORONARY ARTERY BYPASS GRAFT  03-03-2010  dr hendrickson    LIMA to LAD, SVG to RI, left radial artery to OM1, seqSVG  to PDA and PLA   CYST EXCISION Left 11/02/2021   Procedure: REMOVAL OF LEFT CALF MASS;  Surgeon: Leandrew Koyanagi, MD;  Location: ;  Service: Orthopedics;  Laterality: Left;   CYSTO W/ DILATATION , LASER INCISION OF URETHRAL STRICTURE  02-25-2005   dr Karsten Ro  [facility]   CYSTO/  DILATION URETHRAL MEATAL STENOSIS AND DILATION BULBOUS STRICTURE  12-21-2004   dr Terance Hart  [facility]   CYSTO/ RETROGRADE PYELOGRAM/ DILATATION URETHRAL STRICTURE AND PLACEMENT UROLUME STENT  08-04-2011    dr Karsten Ro  [facility]   CYSTOSCOPY WITH DIRECT VISION INTERNAL URETHROTOMY  07/15/2008   dr Karsten Ro  [facility]   and UroLume Stent placement   CYSTOSCOPY WITH URETHRAL DILATATION N/A 04/19/2018   Procedure: CYSTOSCOPY WITH BALLOON URETHRAL DILATATION;  Surgeon: Kathie Rhodes, MD;  Location: [facility];  Service: Urology;  Laterality: N/A;   INCISION AND  DRAINAGE Right 08/13/2021   Procedure: INCISION AND DRAINAGE RIGHT FOOT WOUND;  Surgeon: Vivi Barrack, DPM;  Location: [facility];  Service: Podiatry;  Laterality: Right;   KNEE ARTHROSCOPY Left 11/15/2021   Procedure: LEFT KNEE ARTHROSCOPIC IRRIGATION AND DEBRIDEMENT;  Surgeon: Tarry Kos, MD;  Location: [facility];  Service: Orthopedics;  Laterality: Left;   ORIF TOE FRACTURE Left 05/22/2013   Procedure: OPEN REDUCTION INTERNAL FIXATION (ORIF) LEFT SECOND TOE PROXIMAL PHALANX FRACTURE/LEFT PERCUTANEOUS PINNING;  Surgeon: Toni Arthurs, MD;  Location: [facility];  Service: Orthopedics;  Laterality: Left;   TONSILLECTOMY  child   TOTAL KNEE ARTHROPLASTY Left 08/14/2022   Procedure: LEFT TOTAL KNEE ARTHROPLASTY;  Surgeon: Tarry Kos, MD;  Location: [facility];  Service:  Orthopedics;  Laterality: Left;   Patient Active Problem List   Diagnosis Date Noted   Status post total left knee replacement 08/14/2022   Primary osteoarthritis of left knee 04/07/2022   Effusion, left knee 04/07/2022   Septic arthritis of knee, left (HCC) 11/11/2021   Mass of lower leg, left 11/02/2021   Amputation of right great toe (HCC) 10/31/2021   Anxiety 10/31/2021   Chronic kidney disease, stage 3b (HCC) 10/31/2021   Diabetic renal disease (HCC) 10/31/2021   Gastroesophageal reflux disease without esophagitis 10/31/2021   History of amputation of great toe (HCC) 10/31/2021   Hyperglycemia due to type 2 diabetes mellitus (HCC) 10/31/2021   Leukocytosis 10/31/2021   Long term (current) use of insulin (HCC) 10/31/2021   Malignant hypertensive chronic kidney disease 10/31/2021   Male hypogonadism 10/31/2021   Mass of left adrenal gland (HCC) 10/31/2021   Obstructive sleep apnea syndrome 10/31/2021   Other specified disorders of bladder 10/31/2021   Polyneuropathy due to type 2 diabetes mellitus (HCC) 10/31/2021   Baker's cyst, left 10/06/2021   Knee effusion, left 10/06/2021   Cyanosis 10/06/2021   Hyponatremia    AKI (acute kidney injury) (HCC)    Elevated LFTs    Post-operative state    Cellulitis of left lower extremity    Streptococcal bacteremia    Cellulitis of right lower extremity    Osteomyelitis of great toe of right foot (HCC)    Osteomyelitis of second toe of right foot (HCC)    Diabetes mellitus due to underlying condition with diabetic autonomic neuropathy, without long-term current use of insulin (HCC)    Class 3 severe obesity with serious comorbidity and body mass index (BMI) of 45.0 to 49.9 in adult [facility])    Toe infection 08/08/2021   CAD (coronary artery disease) of artery bypass graft 04/21/2015   Hyperlipidemia 04/21/2015   Essential hypertension 04/21/2015   Type 2 diabetes mellitus (HCC) 04/21/2015   Ulcer of foot (HCC) 08/22/2014     REFERRING DIAG: M17.12 (ICD-10-CM) - Primary osteoarthritis of left knee  THERAPY DIAG:  Chronic pain of left knee  Muscle weakness (generalized)  Stiffness of left knee, not elsewhere classified  Difficulty in walking, not elsewhere classified  Localized edema  Rationale for Evaluation and Treatment Rehabilitation  ONSET DATE: Surgery 08/14/2022  SUBJECTIVE:   SUBJECTIVE STATEMENT: Pt indicated he has been doing pretty good.  Reported no pain upon arrival.  Pt has reported building stamina back up is a goal.  Has walked dog.   PERTINENT HISTORY: PMH - CAD, arthritis, anxiety, cabg, DM, lt knee septic arthritis, rt great toe amputation.  PAIN:  NPRS scale: current 0/10, at worst since last visit 3/10 Pain location: Lt knee  Pain description: stiffness/tightness Aggravating  factors: prolonged walking, stairs Relieving factors: Ice  PRECAUTIONS: None  WEIGHT BEARING RESTRICTIONS No  FALLS:  Has patient fallen in last 6 months? No  LIVING ENVIRONMENT: Lives with: lives with their family Lives in: House/apartment Stairs: bedroom on first floor, stairs to 2nd floor.   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    OBJECTIVE:   PATIENT SURVEYS:  09/06/2022 FOTO intake:  65 predicted:  68  COGNITION: 09/06/2022  Overall cognitive status: WFL    SENSATION: 09/06/2022 WFL  EDEMA:  09/06/2022 Localized edema Lt knee/lower leg  MUSCLE LENGTH: 09/06/2022 No specific testing today  POSTURE:  09/06/2022 No Significant postural limitations  PALPATION: 09/06/2022 No specific tenderness reported  LOWER EXTREMITY ROM:   ROM Right 09/06/2022 Left 09/06/2022  Hip flexion    Hip extension    Hip abduction    Hip adduction    Hip  internal rotation    Hip external rotation    Knee flexion  113 AROM in supine heel slide  Knee extension  0 AROM in seated LQ  Ankle dorsiflexion    Ankle plantarflexion    Ankle inversion    Ankle eversion     (Blank rows = not tested)  LOWER EXTREMITY MMT:  MMT Right 09/06/2022 Left 09/06/2022 Right 09/20/2022 Left 09/20/2022  Hip flexion 5/5 5/5    Hip extension      Hip abduction      Hip adduction      Hip internal rotation      Hip external rotation      Knee flexion 5/5 5/5    Knee extension 5/5 4/5 5/5 61, 57 lbs 5/5 40.4, 37.4 lbs  Ankle dorsiflexion 5/5 5/5    Ankle plantarflexion      Ankle inversion      Ankle eversion       (Blank rows = not tested)  LOWER EXTREMITY SPECIAL TESTS:  09/06/2022 No specific testing.   FUNCTIONAL TESTS:  09/06/2022 18 inch chair transfer s UE assist on 1st  Lt SLS 3 seconds, Rt SLS 2 seconds  GAIT: 09/06/2022 Independent ambulation, mild decrease in stance on Lt.   TODAY'S TREATMENT: 09/20/2022 Therex: Recumbent bike Lvl 3 5 mins, seat 10 Step on over and down WB on Lt leg x 15 Machine LAQ double leg up, Lt leg lowering 2 x 10  Seated SLR x 15 c slow movement control focus Incline calf stretch bilateral 30 sec x 3 Lateral step down 4 inch WB on Lt leg x 10 c bilateral hand assist.   Neuro Re-ed Tandem stance x 1 min bilateral occasional hand assist SLS Lt leg c contralateral leg vector light touch fwd, lateral, reverse x 6 Alternating heel/toe lifts on foam with occasional hand assist x 20 each way c SBA   09/06/2022 Therex:    HEP instruction/performance c cues for techniques, handout provided.  Trial set performed of each for comprehension and symptom assessment.  See below for exercise list    PATIENT EDUCATION:  09/06/2022 Education details: HEP, POC Person educated: Patient Education method: Explanation, Demonstration, Verbal cues, and Handouts Education comprehension: verbalized understanding,  returned demonstration, and verbal cues required    HOME EXERCISE PROGRAM: Access Code: [email or web address] URL: [email or web address] Date: 09/06/2022 Prepared by: Chyrel MassonMichael Hall Birchard  Exercises - Supine  Heel Slide (Mirrored)  - 3-5 x daily - 7 x weekly - 1-2 sets - 10 reps - 2 hold - Seated Long Arc Quad (Mirrored)  - 3-5 x daily - 7 x weekly - 1-2 sets - 10 reps - 2 hold - Seated Quad Set (Mirrored)  - 3-5 x daily - 7 x weekly - 1 sets - 10 reps - 5 hold - Seated Straight Leg Heel Taps  - 1-2 x daily - 7 x weekly - 3 sets - 10 reps - Sit to Stand  - 3 x daily - 7 x weekly - 1 sets - 10 reps  ASSESSMENT:  CLINICAL IMPRESSION: Recheck of strength showed improvement in standard MMT.  Dynamometry gathered today and shown outside of within 15% of Rt for quad strength.  Overall good presentation still continued at this time.  Pt to benefit from continued progressive strengthening and functional balance improvements.    OBJECTIVE IMPAIRMENTS Abnormal gait, decreased activity tolerance, decreased balance, decreased coordination, decreased endurance, decreased mobility, difficulty walking, decreased ROM, decreased strength, hypomobility, increased edema, increased fascial restrictions, impaired perceived functional ability, increased muscle spasms, impaired flexibility, improper body mechanics, and pain.   ACTIVITY LIMITATIONS carrying, lifting, bending, sitting, standing, squatting, sleeping, stairs, transfers, bed mobility, and locomotion level  PARTICIPATION LIMITATIONS: cleaning, interpersonal relationship, driving, shopping, community activity, and yard work  PERSONAL FACTORS  PMH - CAD, arthritis, anxiety, cabg, DM, lt knee septic arthritis, rt great toe amputation.  are also affecting patient's functional outcome.   REHAB POTENTIAL: Good  CLINICAL DECISION MAKING: Stable/uncomplicated  EVALUATION COMPLEXITY: Low   GOALS: Goals reviewed with patient? Yes  Short term PT Goals  (target date for Short term goals are 3 weeks 09/27/2022) Patient will demonstrate independent use of home exercise program to maintain progress from in clinic treatments. Goal status: New   Long term PT goals (target dates for all long term goals are 10 weeks  11/15/2022 )   1. Patient will demonstrate/report pain at worst less than or equal to 2/10 to facilitate minimal limitation in daily activity secondary to pain symptoms. Goal status: New   2. Patient will demonstrate independent use of home exercise program to facilitate ability to maintain/progress functional gains from skilled physical therapy services. Goal status: New   3. Patient will demonstrate FOTO outcome > or = 68 % to indicate reduced disability due to condition. Goal status: New   4.  Patient will demonstrate Lt LE MMT 5/5 throughout to faciltiate usual transfers, stairs, squatting at [facility] for daily life.   Goal status: New   5.  Patient will demonstrate Lt knee AROM 0-110 deg to facilitate usual transfers, sitting, stairs and walking at PLOF.    Goal status: New   6.  Patient will demonstrate independent ambulation community distances > 300 ft s deviation for community integration.   Goal status: New   7.  Patient will demonstrate/report ability to ascend/descend stairs reciprocally s handrail or household navigation.  a.  Goal Status: New    PLAN:  PT FREQUENCY: 1-2x/week  PT DURATION: 10 weeks  PLANNED INTERVENTIONS: Therapeutic exercises, Therapeutic activity, Neuro Muscular re-education, Balance training, Gait training, Patient/Family education, Joint mobilization, Stair training, DME instructions, Dry Needling, Electrical stimulation, Traction, Cryotherapy, Moist heat, vasopneumatic device Taping, Ultrasound, Ionotophoresis 4mg /ml Dexamethasone, and Manual therapy.  All included unless contraindicated   PLAN FOR NEXT SESSION: Review HEP knowledge/results.  Static balance, quad strengthening.     , PT, DPT, OCS, ATC 09/20/22  10:04 AM

## 2022-09-22 ENCOUNTER — Encounter: Payer: Self-pay | Admitting: Rehabilitative and Restorative Service Providers"

## 2022-09-22 ENCOUNTER — Ambulatory Visit (INDEPENDENT_AMBULATORY_CARE_PROVIDER_SITE_OTHER): Payer: 59 | Admitting: Rehabilitative and Restorative Service Providers"

## 2022-09-22 DIAGNOSIS — R262 Difficulty in walking, not elsewhere classified: Secondary | ICD-10-CM | POA: Diagnosis not present

## 2022-09-22 DIAGNOSIS — M6281 Muscle weakness (generalized): Secondary | ICD-10-CM

## 2022-09-22 DIAGNOSIS — G8929 Other chronic pain: Secondary | ICD-10-CM

## 2022-09-22 DIAGNOSIS — M25562 Pain in left knee: Secondary | ICD-10-CM | POA: Diagnosis not present

## 2022-09-22 DIAGNOSIS — R600 Localized edema: Secondary | ICD-10-CM

## 2022-09-22 DIAGNOSIS — M25662 Stiffness of left knee, not elsewhere classified: Secondary | ICD-10-CM

## 2022-09-22 NOTE — Therapy (Signed)
OUTPATIENT PHYSICAL THERAPY TREATMENT   Patient Name: [name] [name] MRN: [id number] DOB:07-Feb-1956, 66 y.o., male Today's Date: 09/22/2022  PCP: Antony Contras MD  REFERRING PROVIDER: Leandrew Koyanagi, MD  END OF SESSION:   PT End of Session - 09/22/22 1012     Visit Number 3    Number of Visits 20    Date for PT Re-Evaluation 11/15/22    Authorization Type [employer] $35 copay - 87 remain    Authorization - Visit Number 3    Authorization - Number of Visits 18    Progress Note Due on Visit 10    PT Start Time 1012    PT Stop Time 1051    PT Time Calculation (min) 39 min    Activity Tolerance Patient tolerated treatment well    Behavior During Therapy WFL for tasks assessed/performed               Past Medical History:  Diagnosis Date   Anxiety    Baker's cyst 10/06/2021   Bulbous urethral stricture    chronic--- hx multiple dilatation's and urolume stent's   CAD (coronary artery disease) 04/ 2011  (previous cardiologist-  dr h. Tamala Julian , lov 06/ 2016)  currently followed by pcp   03-03-2010  CABG x 5, LIMA to LAD, left radial artery to OM1,SVG to R1, seqSVG to PDA and PLA   Cyanosis 10/06/2021   Depression    ED (erectile dysfunction)    Essential hypertension, benign    GERD (gastroesophageal reflux disease)    History of pancreatitis 2005   Knee effusion 10/06/2021   Mixed hyperlipidemia    OSA on CPAP    not used recently   S/P CABG x 5 03/03/2010   Septic arthritis of knee, left ([place]) 11/11/2021   Type II or unspecified type diabetes mellitus without mention of complication, not stated as uncontrolled    endocrinologist-  dr Buddy Duty   Past Surgical History:  Procedure Laterality Date   AMPUTATION Right 08/09/2021   Procedure: AMPUTATION DIGIT;  Surgeon: Trula Slade, DPM;  Location: [facility];  Service: Podiatry;  Laterality: Right;   CARDIAC CATHETERIZATION  03-01-2010   dr Daneen Schick   nuclear stress test --positive moderate ishemia:  severe multivessel CAD  w/ total occlusion of pLAD, HG obstrucion of pPDA with supplies collaterals around the apex to the LAD, severe stenosis distal RCA, total occlusion OM1, 75% D1, normal LVF    CORONARY ARTERY BYPASS GRAFT  03-03-2010  dr hendrickson    LIMA to LAD, SVG to RI, left radial artery to OM1, seqSVG  to PDA and PLA   CYST EXCISION Left 11/02/2021   Procedure: REMOVAL OF LEFT CALF MASS;  Surgeon: Leandrew Koyanagi, MD;  Location: [place];  Service: Orthopedics;  Laterality: Left;   CYSTO W/ DILATATION , LASER INCISION OF URETHRAL STRICTURE  02-25-2005   dr Karsten Ro  [facility]   CYSTO/  DILATION URETHRAL MEATAL STENOSIS AND DILATION BULBOUS STRICTURE  12-21-2004   dr Terance Hart  [facility]   CYSTO/ RETROGRADE PYELOGRAM/ DILATATION URETHRAL STRICTURE AND PLACEMENT UROLUME STENT  08-04-2011    dr Karsten Ro  [facility]   CYSTOSCOPY WITH DIRECT VISION INTERNAL URETHROTOMY  07/15/2008   dr Karsten Ro  [facility]   and UroLume Stent placement   CYSTOSCOPY WITH URETHRAL DILATATION N/A 04/19/2018   Procedure: CYSTOSCOPY WITH BALLOON URETHRAL DILATATION;  Surgeon: Kathie Rhodes, MD;  Location: [facility];  Service: Urology;  Laterality: N/A;   INCISION  AND DRAINAGE Right 08/13/2021   Procedure: INCISION AND DRAINAGE RIGHT FOOT WOUND;  Surgeon: Vivi Barrack, DPM;  Location: [facility];  Service: Podiatry;  Laterality: Right;   KNEE ARTHROSCOPY Left 11/15/2021   Procedure: LEFT KNEE ARTHROSCOPIC IRRIGATION AND DEBRIDEMENT;  Surgeon: Tarry Kos, MD;  Location: [facility];  Service: Orthopedics;  Laterality: Left;   ORIF TOE FRACTURE Left 05/22/2013   Procedure: OPEN REDUCTION INTERNAL FIXATION (ORIF) LEFT SECOND TOE PROXIMAL PHALANX FRACTURE/LEFT PERCUTANEOUS PINNING;  Surgeon: Toni Arthurs, MD;  Location: [facility];  Service: Orthopedics;  Laterality: Left;   TONSILLECTOMY  child   TOTAL KNEE ARTHROPLASTY Left 08/14/2022   Procedure: LEFT TOTAL KNEE ARTHROPLASTY;  Surgeon: Tarry Kos, MD;  Location: [facility];  Service:  Orthopedics;  Laterality: Left;   Patient Active Problem List   Diagnosis Date Noted   Status post total left knee replacement 08/14/2022   Primary osteoarthritis of left knee 04/07/2022   Effusion, left knee 04/07/2022   Septic arthritis of knee, left (HCC) 11/11/2021   Mass of lower leg, left 11/02/2021   Amputation of right great toe (HCC) 10/31/2021   Anxiety 10/31/2021   Chronic kidney disease, stage 3b (HCC) 10/31/2021   Diabetic renal disease (HCC) 10/31/2021   Gastroesophageal reflux disease without esophagitis 10/31/2021   History of amputation of great toe (HCC) 10/31/2021   Hyperglycemia due to type 2 diabetes mellitus (HCC) 10/31/2021   Leukocytosis 10/31/2021   Long term (current) use of insulin (HCC) 10/31/2021   Malignant hypertensive chronic kidney disease 10/31/2021   Male hypogonadism 10/31/2021   Mass of left adrenal gland (HCC) 10/31/2021   Obstructive sleep apnea syndrome 10/31/2021   Other specified disorders of bladder 10/31/2021   Polyneuropathy due to type 2 diabetes mellitus (HCC) 10/31/2021   Baker's cyst, left 10/06/2021   Knee effusion, left 10/06/2021   Cyanosis 10/06/2021   Hyponatremia    AKI (acute kidney injury) (HCC)    Elevated LFTs    Post-operative state    Cellulitis of left lower extremity    Streptococcal bacteremia    Cellulitis of right lower extremity    Osteomyelitis of great toe of right foot (HCC)    Osteomyelitis of second toe of right foot (HCC)    Diabetes mellitus due to underlying condition with diabetic autonomic neuropathy, without long-term current use of insulin (HCC)    Class 3 severe obesity with serious comorbidity and body mass index (BMI) of 45.0 to 49.9 in adult [facility])    Toe infection 08/08/2021   CAD (coronary artery disease) of artery bypass graft 04/21/2015   Hyperlipidemia 04/21/2015   Essential hypertension 04/21/2015   Type 2 diabetes mellitus (HCC) 04/21/2015   Ulcer of foot (HCC) 08/22/2014     REFERRING DIAG: M17.12 (ICD-10-CM) - Primary osteoarthritis of left knee  THERAPY DIAG:  Chronic pain of left knee  Muscle weakness (generalized)  Stiffness of left knee, not elsewhere classified  Difficulty in walking, not elsewhere classified  Localized edema  Rationale for Evaluation and Treatment Rehabilitation  ONSET DATE: Surgery 08/14/2022  SUBJECTIVE:   SUBJECTIVE STATEMENT: Pt indicated a little pain in Rt knee today upon arrival but Lt knee just tight.  Pt indicated some soreness after last visit but nothing major.   PERTINENT HISTORY: PMH - CAD, arthritis, anxiety, cabg, DM, lt knee septic arthritis, rt great toe amputation.  PAIN:  NPRS scale : current 0/10 Pain location: Lt knee  Pain description: stiffness/tightness Aggravating factors: prolonged walking, stairs Relieving factors:  Ice  PRECAUTIONS: None  WEIGHT BEARING RESTRICTIONS No  FALLS:  Has patient fallen in last 6 months? No  LIVING ENVIRONMENT: Lives with: lives with their family Lives in: House/apartment Stairs: bedroom on first floor, stairs to 2nd floor.   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    OBJECTIVE:   PATIENT SURVEYS:  09/06/2022 FOTO intake:  65 predicted:  68  COGNITION: 09/06/2022  Overall cognitive status: WFL    SENSATION: 09/06/2022 WFL  EDEMA:  09/06/2022 Localized edema Lt knee/lower leg  MUSCLE LENGTH: 09/06/2022 No specific testing today  POSTURE:  09/06/2022 No Significant postural limitations  PALPATION: 09/06/2022 No specific tenderness reported  LOWER EXTREMITY ROM:   ROM Right 09/06/2022 Left 09/06/2022  Hip flexion    Hip extension    Hip abduction    Hip adduction    Hip internal rotation    Hip external  rotation    Knee flexion  113 AROM in supine heel slide  Knee extension  0 AROM in seated LQ  Ankle dorsiflexion    Ankle plantarflexion    Ankle inversion    Ankle eversion     (Blank rows = not tested)  LOWER EXTREMITY MMT:  MMT Right 09/06/2022 Left 09/06/2022 Right 09/20/2022 Left 09/20/2022  Hip flexion 5/5 5/5    Hip extension      Hip abduction      Hip adduction      Hip internal rotation      Hip external rotation      Knee flexion 5/5 5/5    Knee extension 5/5 4/5 5/5 61, 57 lbs 5/5 40.4, 37.4 lbs  Ankle dorsiflexion 5/5 5/5    Ankle plantarflexion      Ankle inversion      Ankle eversion       (Blank rows = not tested)  LOWER EXTREMITY SPECIAL TESTS:  09/06/2022 No specific testing.   FUNCTIONAL TESTS:  09/06/2022 18 inch chair transfer s UE assist on 1st  Lt SLS 3 seconds, Rt SLS 2 seconds  GAIT: 09/06/2022 Independent ambulation, mild decrease in stance on Lt.   TODAY'S TREATMENT: 09/22/2022 Therex: Recumbent bike Lvl 3 5 mins, seat 10 Leg press Lt leg 31 lbs x 15 Incline calf stretch bilateral 30 sec x 3 Step on over and down WB on Lt leg x 15 4 inch step Machine LAQ double leg up, Lt leg lowering 2 x 10 10 lbs  Sit to stand to sit 18 inch chair slow lowering x 10   Neuro Re-ed Tandem stance on foam 1 min x 1 bilateral  Tandem ambulation in // bars occasional hand assist 10 ft x 10 SLS Lt leg c contralateral leg vector light touch fwd, lateral, reverse x 8   09/20/2022 Therex: Recumbent bike Lvl 3 5 mins, seat 10 Step on over and down WB on Lt leg x 15 Machine LAQ double leg up, Lt leg lowering 2 x 10 10 lbs Seated SLR x 15 c slow movement control focus Incline calf stretch bilateral 30 sec x 3 Lateral step down 4 inch WB on Lt leg x 10 c bilateral hand assist.   Neuro Re-ed Tandem stance x 1 min bilateral occasional hand assist SLS Lt leg c contralateral leg vector light touch fwd, lateral, reverse  x 6 Alternating heel/toe lifts on  foam with occasional hand assist x 20 each way c SBA   09/06/2022 Therex:    HEP instruction/performance c cues for techniques, handout provided.  Trial set performed of each for comprehension and symptom assessment.  See below for exercise list    PATIENT EDUCATION:  09/06/2022 Education details: HEP, POC Person educated: Patient Education method: Explanation, Demonstration, Verbal cues, and Handouts Education comprehension: verbalized understanding, returned demonstration, and verbal cues required    HOME EXERCISE PROGRAM: Access Code: [email or web address] URL: [email or web address] Date: 09/06/2022 Prepared by: Chyrel Masson  Exercises - Supine Heel Slide (Mirrored)  - 3-5 x daily - 7 x weekly - 1-2 sets - 10 reps - 2 hold - Seated Long Arc Quad (Mirrored)  - 3-5 x daily - 7 x weekly - 1-2 sets - 10 reps - 2 hold - Seated Quad Set (Mirrored)  - 3-5 x daily - 7 x weekly - 1 sets - 10 reps - 5 hold - Seated Straight Leg Heel Taps  - 1-2 x daily - 7 x weekly - 3 sets - 10 reps - Sit to Stand  - 3 x daily - 7 x weekly - 1 sets - 10 reps  ASSESSMENT:  CLINICAL IMPRESSION: Continued strengthening program, balance improvements indicated at this time to continue to progress functional stair navigation and ambulation.  Continued independent ambulation in clinic.    OBJECTIVE IMPAIRMENTS Abnormal gait, decreased activity tolerance, decreased balance, decreased coordination, decreased endurance, decreased mobility, difficulty walking, decreased ROM, decreased strength, hypomobility, increased edema, increased fascial restrictions, impaired perceived functional ability, increased muscle spasms, impaired flexibility, improper body mechanics, and pain.   ACTIVITY LIMITATIONS carrying, lifting, bending, sitting, standing, squatting, sleeping, stairs, transfers, bed mobility, and locomotion level  PARTICIPATION LIMITATIONS: cleaning, interpersonal relationship, driving, shopping,  community activity, and yard work  PERSONAL FACTORS  PMH - CAD, arthritis, anxiety, cabg, DM, lt knee septic arthritis, rt great toe amputation.  are also affecting patient's functional outcome.   REHAB POTENTIAL: Good  CLINICAL DECISION MAKING: Stable/uncomplicated  EVALUATION COMPLEXITY: Low   GOALS: Goals reviewed with patient? Yes  Short term PT Goals (target date for Short term goals are 3 weeks 09/27/2022) Patient will demonstrate independent use of home exercise program to maintain progress from in clinic treatments. On going - 09/22/2022   Long term PT goals (target dates for all long term goals are 10 weeks  11/15/2022 )   1. Patient will demonstrate/report pain at worst less than or equal to 2/10 to facilitate minimal limitation in daily activity secondary to pain symptoms. Goal status: New   2. Patient will demonstrate independent use of home exercise program to facilitate ability to maintain/progress functional gains from skilled physical therapy services. Goal status: New   3. Patient will demonstrate FOTO outcome > or = 68 % to indicate reduced disability due to condition. Goal status: New   4.  Patient will demonstrate Lt LE MMT 5/5 throughout to faciltiate usual transfers, stairs, squatting at [facility] for daily life.   Goal status: New   5.  Patient will demonstrate Lt knee AROM 0-110 deg to facilitate usual transfers, sitting, stairs and walking at PLOF.    Goal status: New   6.  Patient will demonstrate independent ambulation community distances > 300 ft s deviation for community integration.   Goal status: New   7.  Patient will demonstrate/report ability to ascend/descend stairs reciprocally s handrail or household navigation.  a.  Goal Status: New  PLAN:  PT FREQUENCY: 1-2x/week  PT DURATION: 10 weeks  PLANNED INTERVENTIONS: Therapeutic exercises, Therapeutic activity, Neuro Muscular re-education, Balance training, Gait training, Patient/Family  education, Joint mobilization, Stair training, DME instructions, Dry Needling, Electrical stimulation, Traction, Cryotherapy, Moist heat, vasopneumatic device Taping, Ultrasound, Ionotophoresis 4mg /ml Dexamethasone, and Manual therapy.  All included unless contraindicated   PLAN FOR NEXT SESSION: Progressive strengthening, WB stair improvements.    Chyrel MassonMichael Carolyn Maniscalco, PT, DPT, OCS, ATC 09/22/22  10:46 AM

## 2022-09-26 ENCOUNTER — Ambulatory Visit (INDEPENDENT_AMBULATORY_CARE_PROVIDER_SITE_OTHER): Payer: 59

## 2022-09-26 ENCOUNTER — Ambulatory Visit (INDEPENDENT_AMBULATORY_CARE_PROVIDER_SITE_OTHER): Payer: 59 | Admitting: Physician Assistant

## 2022-09-26 ENCOUNTER — Ambulatory Visit (INDEPENDENT_AMBULATORY_CARE_PROVIDER_SITE_OTHER): Payer: 59 | Admitting: Rehabilitative and Restorative Service Providers"

## 2022-09-26 ENCOUNTER — Encounter: Payer: Self-pay | Admitting: Rehabilitative and Restorative Service Providers"

## 2022-09-26 DIAGNOSIS — R262 Difficulty in walking, not elsewhere classified: Secondary | ICD-10-CM

## 2022-09-26 DIAGNOSIS — M6281 Muscle weakness (generalized): Secondary | ICD-10-CM

## 2022-09-26 DIAGNOSIS — R600 Localized edema: Secondary | ICD-10-CM

## 2022-09-26 DIAGNOSIS — Z96652 Presence of left artificial knee joint: Secondary | ICD-10-CM

## 2022-09-26 DIAGNOSIS — M25562 Pain in left knee: Secondary | ICD-10-CM | POA: Diagnosis not present

## 2022-09-26 DIAGNOSIS — G8929 Other chronic pain: Secondary | ICD-10-CM

## 2022-09-26 DIAGNOSIS — M25662 Stiffness of left knee, not elsewhere classified: Secondary | ICD-10-CM

## 2022-09-26 NOTE — Therapy (Addendum)
OUTPATIENT PHYSICAL THERAPY TREATMENT /DISCHARGE   Patient Name: [name] [name] MRN: [id number] DOB:05-Aug-1956, 66 y.o., male Today's Date: 09/26/2022  PCP: Antony Contras MD  REFERRING PROVIDER: Leandrew Koyanagi, MD  END OF SESSION:   PT End of Session - 09/26/22 0829     Visit Number 4    Number of Visits 20    Date for PT Re-Evaluation 11/15/22    Authorization Type [employer] $35 copay - 52 remain    Authorization - Visit Number 4    Authorization - Number of Visits 18    Progress Note Due on Visit 10    PT Start Time 0832    PT Stop Time 0912    PT Time Calculation (min) 40 min    Activity Tolerance Patient tolerated treatment well    Behavior During Therapy [facility] for tasks assessed/performed                Past Medical History:  Diagnosis Date   Anxiety    Baker's cyst 10/06/2021   Bulbous urethral stricture    chronic--- hx multiple dilatation's and urolume stent's   CAD (coronary artery disease) 04/ 2011  (previous cardiologist-  dr h. Tamala Julian , lov 06/ 2016)  currently followed by pcp   03-03-2010  CABG x 5, LIMA to LAD, left radial artery to OM1,SVG to R1, seqSVG to PDA and PLA   Cyanosis 10/06/2021   Depression    ED (erectile dysfunction)    Essential hypertension, benign    GERD (gastroesophageal reflux disease)    History of pancreatitis 2005   Knee effusion 10/06/2021   Mixed hyperlipidemia    OSA on CPAP    not used recently   S/P CABG x 5 03/03/2010   Septic arthritis of knee, left ([place]) 11/11/2021   Type II or unspecified type diabetes mellitus without mention of complication, not stated as uncontrolled    endocrinologist-  dr Buddy Duty   Past Surgical History:  Procedure Laterality Date   AMPUTATION Right 08/09/2021   Procedure: AMPUTATION DIGIT;  Surgeon: Trula Slade, DPM;  [facility];  Service: Podiatry;  Laterality: Right;   CARDIAC CATHETERIZATION  03-01-2010   dr Daneen Schick   nuclear stress test --positive moderate ishemia:  severe  multivessel CAD w/ total occlusion of pLAD, HG obstrucion of pPDA with supplies collaterals around the apex to the LAD, severe stenosis distal RCA, total occlusion OM1, 75% D1, normal LVF    CORONARY ARTERY BYPASS GRAFT  03-03-2010  dr hendrickson    LIMA to LAD, SVG to RI, left radial artery to OM1, seqSVG  to PDA and PLA   CYST EXCISION Left 11/02/2021   Procedure: REMOVAL OF LEFT CALF MASS;  Surgeon: Leandrew Koyanagi, MD;  Location: [place];  Service: Orthopedics;  Laterality: Left;   CYSTO W/ DILATATION , LASER INCISION OF URETHRAL STRICTURE  02-25-2005   dr Karsten Ro  [facility]   CYSTO/  DILATION URETHRAL MEATAL STENOSIS AND DILATION BULBOUS STRICTURE  12-21-2004   dr Terance Hart  [facility]   CYSTO/ RETROGRADE PYELOGRAM/ DILATATION URETHRAL STRICTURE AND PLACEMENT UROLUME STENT  08-04-2011    dr Karsten Ro  [facility]   CYSTOSCOPY WITH DIRECT VISION INTERNAL URETHROTOMY  07/15/2008   dr Karsten Ro  [facility]   and UroLume Stent placement   CYSTOSCOPY WITH URETHRAL DILATATION N/A 04/19/2018   Procedure: CYSTOSCOPY WITH BALLOON URETHRAL DILATATION;  Surgeon: Kathie Rhodes, MD;  Location: [facility];  Service: Urology;  Laterality: N/A;  INCISION AND DRAINAGE Right 08/13/2021   Procedure: INCISION AND DRAINAGE RIGHT FOOT WOUND;  Surgeon: Trula Slade, DPM;  [facility];  Service: Podiatry;  Laterality: Right;   KNEE ARTHROSCOPY Left 11/15/2021   Procedure: LEFT KNEE ARTHROSCOPIC IRRIGATION AND DEBRIDEMENT;  Surgeon: Leandrew Koyanagi, MD;  Location: [place];  Service: Orthopedics;  Laterality: Left;   ORIF TOE FRACTURE Left 05/22/2013   Procedure: OPEN REDUCTION INTERNAL FIXATION (ORIF) LEFT SECOND TOE PROXIMAL PHALANX FRACTURE/LEFT PERCUTANEOUS PINNING;  Surgeon: Wylene Simmer, MD;  Location: [place];  Service: Orthopedics;  Laterality: Left;   TONSILLECTOMY  child   TOTAL KNEE ARTHROPLASTY Left 08/14/2022   Procedure: LEFT TOTAL KNEE ARTHROPLASTY;  Surgeon: Leandrew Koyanagi, MD;  Location: [place];   Service: Orthopedics;  Laterality: Left;   Patient Active Problem List   Diagnosis Date Noted   Status post total left knee replacement 08/14/2022   Primary osteoarthritis of left knee 04/07/2022   Effusion, left knee 04/07/2022   Septic arthritis of knee, left ([place]) 11/11/2021   Mass of lower leg, left 11/02/2021   Amputation of right great toe ([place]) 10/31/2021   Anxiety 10/31/2021   Chronic kidney disease, stage 3b ([place]) 10/31/2021   Diabetic renal disease ([place]) 10/31/2021   Gastroesophageal reflux disease without esophagitis 10/31/2021   History of amputation of great toe ([place]) 10/31/2021   Hyperglycemia due to type 2 diabetes mellitus ([place]) 10/31/2021   Leukocytosis 10/31/2021   Long term (current) use of insulin ([place]) 10/31/2021   Malignant hypertensive chronic kidney disease 10/31/2021   Male hypogonadism 10/31/2021   Mass of left adrenal gland ([place]) 10/31/2021   Obstructive sleep apnea syndrome 10/31/2021   Other specified disorders of bladder 10/31/2021   Polyneuropathy due to type 2 diabetes mellitus ([place]) 10/31/2021   Baker's cyst, left 10/06/2021   Knee effusion, left 10/06/2021   Cyanosis 10/06/2021   Hyponatremia    AKI (acute kidney injury) ([place])    Elevated LFTs    Post-operative state    Cellulitis of left lower extremity    Streptococcal bacteremia    Cellulitis of right lower extremity    Osteomyelitis of great toe of right foot (HCC)    Osteomyelitis of second toe of right foot ([place])    Diabetes mellitus due to underlying condition with diabetic autonomic neuropathy, without long-term current use of insulin (HCC)    Class 3 severe obesity with serious comorbidity and body mass index (BMI) of 45.0 to 49.9 in adult [facility])    Toe infection 08/08/2021   CAD (coronary artery disease) of artery bypass graft 04/21/2015   Hyperlipidemia 04/21/2015   Essential hypertension 04/21/2015   Type 2 diabetes mellitus ([place]) 04/21/2015   Ulcer of foot ([place]) 08/22/2014     REFERRING DIAG: M17.12 (ICD-10-CM) - Primary osteoarthritis of left knee  THERAPY DIAG:  Chronic pain of left knee  Muscle weakness (generalized)  Stiffness of left knee, not elsewhere classified  Difficulty in walking, not elsewhere classified  Localized edema  Rationale for Evaluation and Treatment Rehabilitation  ONSET DATE: Surgery 08/14/2022  SUBJECTIVE:   SUBJECTIVE STATEMENT: Pt indicated feeling pretty good today.  Working on getting stamina up.  Pt indicated he was able to take dog on walk.   PERTINENT HISTORY: PMH - CAD, arthritis, anxiety, cabg, DM, lt knee septic arthritis, rt great toe amputation.  PAIN:  NPRS scale : current 0/10 Pain location: Lt knee  Pain description: stiffness/tightness Aggravating factors: prolonged walking, stairs Relieving factors: Ice  PRECAUTIONS:  None  WEIGHT BEARING RESTRICTIONS No  FALLS:  Has patient fallen in last 6 months? No  LIVING ENVIRONMENT: Lives with: lives with their family Lives in: House/apartment Stairs: bedroom on first floor, stairs to 2nd floor.   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    OBJECTIVE:   PATIENT SURVEYS:  09/06/2022 FOTO intake:  65 predicted:  46  COGNITION: 09/06/2022  Overall cognitive status: WFL    SENSATION: 09/06/2022 WFL  EDEMA:  09/06/2022 Localized edema Lt knee/lower leg  MUSCLE LENGTH: 09/06/2022 No specific testing today  POSTURE:  09/06/2022 No Significant postural limitations  PALPATION: 09/06/2022 No specific tenderness reported  LOWER EXTREMITY ROM:   ROM Right 09/06/2022 Left 09/06/2022 Left 09/26/2022  Hip flexion     Hip extension     Hip abduction     Hip adduction     Hip internal rotation     Hip external  rotation     Knee flexion  113 AROM in supine heel slide 118 AROM in supine heel slide  Knee extension  0 AROM in seated LAQ 0 in AROM seated LAQ  Ankle dorsiflexion     Ankle plantarflexion     Ankle inversion     Ankle eversion      (Blank rows = not tested)  LOWER EXTREMITY MMT:  MMT Right 09/06/2022 Left 09/06/2022 Right 09/20/2022 Left 09/20/2022  Hip flexion 5/5 5/5    Hip extension      Hip abduction      Hip adduction      Hip internal rotation      Hip external rotation      Knee flexion 5/5 5/5    Knee extension 5/5 4/5 5/5 61, 57 lbs 5/5 40.4, 37.4 lbs  Ankle dorsiflexion 5/5 5/5    Ankle plantarflexion      Ankle inversion      Ankle eversion       (Blank rows = not tested)  LOWER EXTREMITY SPECIAL TESTS:  09/06/2022 No specific testing.   FUNCTIONAL TESTS:  09/06/2022 18 inch chair transfer s UE assist on 1st  Lt SLS 3 seconds, Rt SLS 2 seconds  GAIT: 09/06/2022 Independent ambulation, mild decrease in stance on Lt.   TODAY'S TREATMENT: 09/26/2022 Therex: Recumbent bike Lvl 3 5 mins, seat 10 Leg press Lt leg 37 lbs 2 x 15 Incline calf stretch bilateral 30 sec x 3 Step on over and down WB on Lt leg x 15 6 inch step Machine LAQ double leg up, Lt leg lowering 2 x 10 10 lbs    Neuro Re-ed Tandem stance on foam 1 min x 1 bilateral  Tandem ambulation in // bars on foam occasional hand assist 10 ft x 7 Rocker board fwd/back light touching control c occasional to moderate HHA x 30 SLS c black mat corner touching x 8 each , performed bilaterally  09/22/2022 Therex: Recumbent bike Lvl 3 5 mins, seat 10 Leg press Lt leg 31 lbs x 15 Incline calf stretch bilateral 30 sec x 3 Step on over and down WB on Lt leg x 15 4 inch step Machine LAQ double leg up, Lt leg lowering 2 x 10 10 lbs  Sit to stand to sit 18 inch chair slow lowering x 10   Neuro Re-ed  Tandem stance on foam 1 min x 1 bilateral  Tandem ambulation in // bars occasional hand assist 10 ft  x 10 SLS Lt leg c contralateral leg vector light touch fwd, lateral, reverse x 8   09/20/2022 Therex: Recumbent bike Lvl 3 5 mins, seat 10 Step on over and down WB on Lt leg x 15 Machine LAQ double leg up, Lt leg lowering 2 x 10 10 lbs Seated SLR x 15 c slow movement control focus Incline calf stretch bilateral 30 sec x 3 Lateral step down 4 inch WB on Lt leg x 10 c bilateral hand assist.   Neuro Re-ed Tandem stance x 1 min bilateral occasional hand assist SLS Lt leg c contralateral leg vector light touch fwd, lateral, reverse x 6 Alternating heel/toe lifts on foam with occasional hand assist x 20 each way c SBA   PATIENT EDUCATION:  09/06/2022 Education details: HEP, POC Person educated: Patient Education method: Consulting civil engineer, Media planner, Verbal cues, and Handouts Education comprehension: verbalized understanding, returned demonstration, and verbal cues required    HOME EXERCISE PROGRAM: Access Code: [email or web address] URL: [email or web address] Date: 09/06/2022 Prepared by: Scot Jun  Exercises - Supine Heel Slide (Mirrored)  - 3-5 x daily - 7 x weekly - 1-2 sets - 10 reps - 2 hold - Seated Long Arc Quad (Mirrored)  - 3-5 x daily - 7 x weekly - 1-2 sets - 10 reps - 2 hold - Seated Quad Set (Mirrored)  - 3-5 x daily - 7 x weekly - 1 sets - 10 reps - 5 hold - Seated Straight Leg Heel Taps  - 1-2 x daily - 7 x weekly - 3 sets - 10 reps - Sit to Stand  - 3 x daily - 7 x weekly - 1 sets - 10 reps  ASSESSMENT:  CLINICAL IMPRESSION: Range of motion continued to show gains (was already doing well).  Continued improvement in WB strength control with progression in stair height closer to normal height steps.  Plan to continue strengthening and balance control with skilled PT to progress towards goals.    OBJECTIVE IMPAIRMENTS Abnormal gait, decreased activity tolerance, decreased balance, decreased coordination, decreased endurance, decreased mobility, difficulty  walking, decreased ROM, decreased strength, hypomobility, increased edema, increased fascial restrictions, impaired perceived functional ability, increased muscle spasms, impaired flexibility, improper body mechanics, and pain.   ACTIVITY LIMITATIONS carrying, lifting, bending, sitting, standing, squatting, sleeping, stairs, transfers, bed mobility, and locomotion level  PARTICIPATION LIMITATIONS: cleaning, interpersonal relationship, driving, shopping, community activity, and yard work  PERSONAL FACTORS  PMH - CAD, arthritis, anxiety, cabg, DM, lt knee septic arthritis, rt great toe amputation.  are also affecting patient's functional outcome.   REHAB POTENTIAL: Good  CLINICAL DECISION MAKING: Stable/uncomplicated  EVALUATION COMPLEXITY: Low   GOALS: Goals reviewed with patient? Yes  Short term PT Goals (target date for Short term goals are 3 weeks 09/27/2022) Patient will demonstrate independent use of home exercise program to maintain progress from in clinic treatments. Met 09/26/2022   Long term PT goals (target dates for all long term goals are 10 weeks  11/15/2022 )   1. Patient will demonstrate/report pain at worst less than or equal to 2/10 to facilitate minimal limitation in daily activity secondary to pain symptoms. Goal status: on going 09/26/2022   2. Patient will demonstrate independent use of home exercise program to facilitate ability to maintain/progress functional gains from skilled physical therapy services. Goal status: on going 09/26/2022   3. Patient will demonstrate FOTO outcome >  or = 68 % to indicate reduced disability due to condition. Goal status: on going 09/26/2022   4.  Patient will demonstrate Lt LE MMT 5/5 throughout to faciltiate usual transfers, stairs, squatting at [facility] for daily life.   Goal status: Met 09/26/2022   5.  Patient will demonstrate Lt knee AROM 0-110 deg to facilitate usual transfers, sitting, stairs and walking at PLOF.    Goal status:  Met 09/26/2022   6.  Patient will demonstrate independent ambulation community distances > 300 ft s deviation for community integration.   Goal status: Met 09/26/2022   7.  Patient will demonstrate/report ability to ascend/descend stairs reciprocally s handrail or household navigation.  a.  Goal Status: on going 09/26/2022    PLAN:  PT FREQUENCY: 1-2x/week  PT DURATION: 10 weeks  PLANNED INTERVENTIONS: Therapeutic exercises, Therapeutic activity, Neuro Muscular re-education, Balance training, Gait training, Patient/Family education, Joint mobilization, Stair training, DME instructions, Dry Needling, Electrical stimulation, Traction, Cryotherapy, Moist heat, vasopneumatic device Taping, Ultrasound, Ionotophoresis 32m/ml Dexamethasone, and Manual therapy.  All included unless contraindicated   PLAN FOR NEXT SESSION: Progressive strengthening on stairs, compliant surface balance.    MScot Jun PT, DPT, OCS, ATC 09/26/22  9:10 AM    PHYSICAL THERAPY DISCHARGE SUMMARY  Visits from Start of Care: 4  Current functional level related to goals / functional outcomes: See note   Remaining deficits: See note   Education / Equipment: HEP  Patient goals were  mostly met . Patient is being discharged due to not returning since the last visit.  MScot Jun PT, DPT, OCS, ATC 10/26/22  9:10 AM

## 2022-09-26 NOTE — Progress Notes (Signed)
Post-Op Visit Note   Patient: [name] [name]           Date of Birth: 1956/04/02           MRN: [id number] Visit Date: 09/26/2022 PCP: Antony Contras, MD   Assessment & Plan:  Chief Complaint:  Chief Complaint  Patient presents with   Left Knee - Routine Post Op    08/14/22 left TKA   Visit Diagnoses:  1. Status post left knee replacement     Plan: Patient is a pleasant 10 old gentleman who comes in today 6 weeks status post left total knee replacement 08/14/2022.  He has been doing great.  He has been compliant taking a baby aspirin twice daily.  He has been taking Norco in the morning which is also been helping.  He has been in physical therapy making fantastic progress.  Overall feeling very good.  Examination of his left knee reveals a fully healed surgical scar without complication.  Range of motion 0 to 130 degrees.  He is stable valgus varus stress.  Calves are soft nontender.  He is neurovascular intact distally.  At this point, he may discontinue his baby aspirin twice daily.  Based on his progress with physical therapy, I am okay with the wean to 2 a home exercise program only.  Dental prophylaxis reinforced.  Follow-up in 6 weeks for repeat evaluation.  Call with concerns or questions.  Follow-Up Instructions: Return in about 6 weeks (around 11/07/2022).   Orders:  Orders Placed This Encounter  Procedures   XR Knee 1-2 Views Left   No orders of the defined types were placed in this encounter.   Imaging: No results found.  PMFS History: Patient Active Problem List   Diagnosis Date Noted   Status post total left knee replacement 08/14/2022   Primary osteoarthritis of left knee 04/07/2022   Effusion, left knee 04/07/2022   Septic arthritis of knee, left ([place]) 11/11/2021   Mass of lower leg, left 11/02/2021   Amputation of right great toe ([place]) 10/31/2021   Anxiety 10/31/2021   Chronic kidney disease, stage 3b ([place]) 10/31/2021   Diabetic renal disease ([place])  10/31/2021   Gastroesophageal reflux disease without esophagitis 10/31/2021   History of amputation of great toe ([place]) 10/31/2021   Hyperglycemia due to type 2 diabetes mellitus ([place]) 10/31/2021   Leukocytosis 10/31/2021   Long term (current) use of insulin ([place]) 10/31/2021   Malignant hypertensive chronic kidney disease 10/31/2021   Male hypogonadism 10/31/2021   Mass of left adrenal gland ([place]) 10/31/2021   Obstructive sleep apnea syndrome 10/31/2021   Other specified disorders of bladder 10/31/2021   Polyneuropathy due to type 2 diabetes mellitus ([place]) 10/31/2021   Baker's cyst, left 10/06/2021   Knee effusion, left 10/06/2021   Cyanosis 10/06/2021   Hyponatremia    AKI (acute kidney injury) ([place])    Elevated LFTs    Post-operative state    Cellulitis of left lower extremity    Streptococcal bacteremia    Cellulitis of right lower extremity    Osteomyelitis of great toe of right foot (HCC)    Osteomyelitis of second toe of right foot ([place])    Diabetes mellitus due to underlying condition with diabetic autonomic neuropathy, without long-term current use of insulin (HCC)    Class 3 severe obesity with serious comorbidity and body mass index (BMI) of 45.0 to 49.9 in adult [facility])    Toe infection 08/08/2021   CAD (coronary artery disease) of artery  bypass graft 04/21/2015   Hyperlipidemia 04/21/2015   Essential hypertension 04/21/2015   Type 2 diabetes mellitus ([place]) 04/21/2015   Ulcer of foot ([place]) 08/22/2014   Past Medical History:  Diagnosis Date   Anxiety    Baker's cyst 10/06/2021   Bulbous urethral stricture    chronic--- hx multiple dilatation's and urolume stent's   CAD (coronary artery disease) 04/ 2011  (previous cardiologist-  dr h. Tamala Julian , lov 06/ 2016)  currently followed by pcp   03-03-2010  CABG x 5, LIMA to LAD, left radial artery to OM1,SVG to R1, seqSVG to PDA and PLA   Cyanosis 10/06/2021   Depression    ED (erectile dysfunction)    Essential hypertension,  benign    GERD (gastroesophageal reflux disease)    History of pancreatitis 2005   Knee effusion 10/06/2021   Mixed hyperlipidemia    OSA on CPAP    not used recently   S/P CABG x 5 03/03/2010   Septic arthritis of knee, left ([place]) 11/11/2021   Type II or unspecified type diabetes mellitus without mention of complication, not stated as uncontrolled    endocrinologist-  dr Buddy Duty    Family History  Problem Relation Age of Onset   Cancer Mother        lung   Hypertension Father    CAD Father    CVA Brother     Past Surgical History:  Procedure Laterality Date   AMPUTATION Right 08/09/2021   Procedure: AMPUTATION DIGIT;  Surgeon: Trula Slade, DPM;  [facility];  Service: Podiatry;  Laterality: Right;   CARDIAC CATHETERIZATION  03-01-2010   dr Daneen Schick   nuclear stress test --positive moderate ishemia:  severe multivessel CAD w/ total occlusion of pLAD, HG obstrucion of pPDA with supplies collaterals around the apex to the LAD, severe stenosis distal RCA, total occlusion OM1, 75% D1, normal LVF    CORONARY ARTERY BYPASS GRAFT  03-03-2010  dr hendrickson    LIMA to LAD, SVG to RI, left radial artery to OM1, seqSVG  to PDA and PLA   CYST EXCISION Left 11/02/2021   Procedure: REMOVAL OF LEFT CALF MASS;  Surgeon: Leandrew Koyanagi, MD;  Location: [place];  Service: Orthopedics;  Laterality: Left;   CYSTO W/ DILATATION , LASER INCISION OF URETHRAL STRICTURE  02-25-2005   dr Karsten Ro  [facility]   CYSTO/  DILATION URETHRAL MEATAL STENOSIS AND DILATION BULBOUS STRICTURE  12-21-2004   dr Terance Hart  [facility]   CYSTO/ RETROGRADE PYELOGRAM/ DILATATION URETHRAL STRICTURE AND PLACEMENT UROLUME STENT  08-04-2011    dr Karsten Ro  [facility]   CYSTOSCOPY WITH DIRECT VISION INTERNAL URETHROTOMY  07/15/2008   dr Karsten Ro  [facility]   and UroLume Stent placement   CYSTOSCOPY WITH URETHRAL DILATATION N/A 04/19/2018   Procedure: CYSTOSCOPY WITH BALLOON URETHRAL DILATATION;  Surgeon: Kathie Rhodes, MD;   Location: [facility];  Service: Urology;  Laterality: N/A;   INCISION AND DRAINAGE Right 08/13/2021   Procedure: INCISION AND DRAINAGE RIGHT FOOT WOUND;  Surgeon: Trula Slade, DPM;  [facility];  Service: Podiatry;  Laterality: Right;   KNEE ARTHROSCOPY Left 11/15/2021   Procedure: LEFT KNEE ARTHROSCOPIC IRRIGATION AND DEBRIDEMENT;  Surgeon: Leandrew Koyanagi, MD;  Location: [place];  Service: Orthopedics;  Laterality: Left;   ORIF TOE FRACTURE Left 05/22/2013   Procedure: OPEN REDUCTION INTERNAL FIXATION (ORIF) LEFT SECOND TOE PROXIMAL PHALANX FRACTURE/LEFT PERCUTANEOUS PINNING;  Surgeon: Wylene Simmer, MD;  [facility];  Service: Orthopedics;  Laterality: Left;   TONSILLECTOMY  child   TOTAL KNEE ARTHROPLASTY Left 08/14/2022   Procedure: LEFT TOTAL KNEE ARTHROPLASTY;  Surgeon: Leandrew Koyanagi, MD;  Location: [place];  Service: Orthopedics;  Laterality: Left;   Social History   Occupational History   Not on file  Tobacco Use   Smoking status: Never   Smokeless tobacco: Never  Vaping Use   Vaping Use: Never used  Substance and Sexual Activity   Alcohol use: Yes    Comment: occasional   Drug use: No   Sexual activity: Yes

## 2022-09-29 ENCOUNTER — Encounter: Payer: 59 | Admitting: Rehabilitative and Restorative Service Providers"

## 2022-10-02 ENCOUNTER — Ambulatory Visit (INDEPENDENT_AMBULATORY_CARE_PROVIDER_SITE_OTHER): Payer: 59 | Admitting: Podiatry

## 2022-10-02 DIAGNOSIS — M79674 Pain in right toe(s): Secondary | ICD-10-CM | POA: Diagnosis not present

## 2022-10-02 DIAGNOSIS — M216X1 Other acquired deformities of right foot: Secondary | ICD-10-CM

## 2022-10-02 DIAGNOSIS — E1149 Type 2 diabetes mellitus with other diabetic neurological complication: Secondary | ICD-10-CM

## 2022-10-02 DIAGNOSIS — M79675 Pain in left toe(s): Secondary | ICD-10-CM

## 2022-10-02 DIAGNOSIS — B351 Tinea unguium: Secondary | ICD-10-CM | POA: Diagnosis not present

## 2022-10-02 DIAGNOSIS — L97512 Non-pressure chronic ulcer of other part of right foot with fat layer exposed: Secondary | ICD-10-CM | POA: Diagnosis not present

## 2022-10-04 ENCOUNTER — Encounter: Payer: 59 | Admitting: Rehabilitative and Restorative Service Providers"

## 2022-10-04 NOTE — Progress Notes (Signed)
Subjective: Chief Complaint  Patient presents with   Diabetic Ulcer    Diabetic ulcer right foot,     66 y.o. male presents with the above concerns.  States he is doing well.  Still keeping Betadine on the wound and doing daily dressing changes.  States he is doing better.  No drainage or pus.  No swelling or redness.   Also states that the left knee surgery is doing well and he is walking better.    A1c 6.2 on 06/07/2022  Objective: AAO x3, NAD DP/PT pulses palpable bilaterally, CRT less than 3 seconds On the right foot submetatarsal 2 is a hyperkeratotic lesion.  There is dried blood under the callus and upon debridement there is no ongoing ulceration although it is preulcerative.  There is no drainage or pus.  No fluctuation or crepitation.  There is no malodor. Nails are hypertrophic, dystrophic, brittle, discolored, elongated 8. No surrounding redness or drainage. Tenderness nails 1-5 on the left and 3 through 5 on the right.  No open lesions or pre-ulcerative lesions are identified today. On the right third toe superficial area skin breakdown from where he was given a bandage on it and it seemed that the small piece of skin is peeled off. Preulcerative callus on the left hallux. No pain with calf compression, swelling, warmth, erythema   Assessment: Ulceration right foot, prominent metatarsal head, onychomycosis  Plan: -All treatment options discussed with the patient including all alternatives, risks, complications.  -Sharply debrided the preulcerative callus on the right foot without any complications or bleeding.  Seems to be doing better.  Continue with daily dressing changes. -Sharply debrided nails x8 without any complications or bleeding. -Monitor the abrasion on the lesser toe.  Betadine to this area daily. -Monitor for any clinical signs or symptoms of infection and directed to call the office immediately should any occur or go to the ER.   Return for 3-4 weeks for  ulcer.  Vivi Barrack DPM

## 2022-10-05 ENCOUNTER — Ambulatory Visit
Admission: RE | Admit: 2022-10-05 | Discharge: 2022-10-05 | Disposition: A | Discharge status: Home / Self Care | Payer: 59 | Source: Ambulatory Visit | Attending: Family Medicine | Admitting: Family Medicine

## 2022-10-05 DIAGNOSIS — E278 Other specified disorders of adrenal gland: Secondary | ICD-10-CM

## 2022-10-05 MED ORDER — IOPAMIDOL (ISOVUE-300) INJECTION 61%
80.0000 mL | Freq: Once | INTRAVENOUS | Status: AC | PRN
  Administered 2022-10-05: 80 mL via INTRAVENOUS

## 2022-10-06 ENCOUNTER — Encounter: Payer: 59 | Admitting: Rehabilitative and Restorative Service Providers"

## 2022-10-11 ENCOUNTER — Encounter: Payer: 59 | Admitting: Physical Therapy

## 2022-10-23 ENCOUNTER — Ambulatory Visit (INDEPENDENT_AMBULATORY_CARE_PROVIDER_SITE_OTHER): Payer: 59 | Admitting: Podiatry

## 2022-10-23 VITALS — BP 151/80 | HR 73 | Temp 97.9°F | Resp 17

## 2022-10-23 DIAGNOSIS — E1149 Type 2 diabetes mellitus with other diabetic neurological complication: Secondary | ICD-10-CM

## 2022-10-23 DIAGNOSIS — L84 Corns and callosities: Secondary | ICD-10-CM | POA: Diagnosis not present

## 2022-10-23 DIAGNOSIS — M216X1 Other acquired deformities of right foot: Secondary | ICD-10-CM | POA: Diagnosis not present

## 2022-10-23 NOTE — Progress Notes (Unsigned)
Subjective: Chief Complaint  Patient presents with   Foot Ulcer    Diabetic right foot ulcer, A1c-6.8 BG- 117,      66 y.o. male presents with the above concerns.  States he been keeping a bandage on the areas on the right foot he has not seen any drainage or pus.  No increased swelling or redness.  He denies any fevers or chills.  Has no other concerns today.    A1c 5.9 on August 11, 2022  Objective: AAO x3, NAD DP/PT pulses palpable bilaterally, CRT less than 3 seconds On the right foot submetatarsal 2 is a hyperkeratotic lesion as well as the distal aspect of the third toe.  There is hyperkeratotic tissue with dried blood present however upon debridement there is no underlying ulceration but is preulcerative.  There is no drainage or pus.  No fluctuance or crepitation but there is no malodor.  Hammertoes are present. Preulcerative callus on the left hallux. No pain with calf compression, swelling, warmth, erythema   Assessment: Ulceration right foot, prominent metatarsal head  Plan: -All treatment options discussed with the patient including all alternatives, risks, complications.  -Sharply debrided the preulcerative lesion to any complications.  Appears that they are doing much better.  Still continue with daily dressing changes for which she has been doing and continue offloading.  Discussed daily foot inspection.  Monitor for any signs or symptoms of infection.  Vivi Barrack DPM

## 2022-10-30 ENCOUNTER — Encounter: Payer: Self-pay | Admitting: Interventional Cardiology

## 2022-11-07 ENCOUNTER — Ambulatory Visit (INDEPENDENT_AMBULATORY_CARE_PROVIDER_SITE_OTHER): Payer: 59

## 2022-11-07 ENCOUNTER — Encounter: Payer: Self-pay | Admitting: Orthopaedic Surgery

## 2022-11-07 ENCOUNTER — Ambulatory Visit (INDEPENDENT_AMBULATORY_CARE_PROVIDER_SITE_OTHER): Payer: 59 | Admitting: Orthopaedic Surgery

## 2022-11-07 DIAGNOSIS — Z96652 Presence of left artificial knee joint: Secondary | ICD-10-CM

## 2022-11-07 NOTE — Progress Notes (Signed)
Post-Op Visit Note   Patient: [name] [name]           Date of Birth: 12/20/1955           MRN: [id number] Visit Date: 11/07/2022 PCP: [name] Joe, MD   Assessment & Plan:  Chief Complaint:  Chief Complaint  Patient presents with   Left Knee - Routine Post Op   Visit Diagnoses:  1. Status post left knee replacement     Plan: [name] [name] is about 3 months status post left total knee replacement on 08/14/2022.  He is doing really well.  He is here by himself today.  He is not using any assistive devices.  He is really happy.  Still working on increasing stamina.  Examination of the left knee shows a fully healed surgical scar.  He does have a little bit of a prepatellar bursitis without any signs of infection.  His range of motion is progressing very nicely.  Stable to varus valgus stress.  The x-ray show stable implant without any complications.  He can continue to advance activity as tolerated.  Dental prophylaxis reinforced.  Recheck in 3 months with two-view x-rays of the left knee.  Follow-Up Instructions: Return in about 3 months (around 02/06/2023).   Orders:  Orders Placed This Encounter  Procedures   XR Knee 1-2 Views Left   No orders of the defined types were placed in this encounter.   Imaging: XR Knee 1-2 Views Left  Result Date: 11/07/2022 Stable left total knee replacement in good alignment without complication.   PMFS History: Patient Active Problem List   Diagnosis Date Noted   Status post total left knee replacement 08/14/2022   Primary osteoarthritis of left knee 04/07/2022   Effusion, left knee 04/07/2022   Septic arthritis of knee, left (HCC) 11/11/2021   Mass of lower leg, left 11/02/2021   Amputation of right great toe (HCC) 10/31/2021   Anxiety 10/31/2021   Chronic kidney disease, stage 3b (HCC) 10/31/2021   Diabetic renal disease (HCC) 10/31/2021   Gastroesophageal reflux disease without esophagitis 10/31/2021   History of amputation of great  toe (HCC) 10/31/2021   Hyperglycemia due to type 2 diabetes mellitus (HCC) 10/31/2021   Leukocytosis 10/31/2021   Long term (current) use of insulin (HCC) 10/31/2021   Malignant hypertensive chronic kidney disease 10/31/2021   Male hypogonadism 10/31/2021   Mass of left adrenal gland (HCC) 10/31/2021   Obstructive sleep apnea syndrome 10/31/2021   Other specified disorders of bladder 10/31/2021   Polyneuropathy due to type 2 diabetes mellitus (HCC) 10/31/2021   Baker's cyst, left 10/06/2021   Knee effusion, left 10/06/2021   Cyanosis 10/06/2021   Hyponatremia    AKI (acute kidney injury) (HCC)    Elevated LFTs    Post-operative state    Cellulitis of left lower extremity    Streptococcal bacteremia    Cellulitis of right lower extremity    Osteomyelitis of great toe of right foot (HCC)    Osteomyelitis of second toe of right foot (HCC)    Diabetes mellitus due to underlying condition with diabetic autonomic neuropathy, without long-term current use of insulin (HCC)    Class 3 severe obesity with serious comorbidity and body mass index (BMI) of 45.0 to 49.9 in adult [name] [name])    Toe infection 08/08/2021   CAD (coronary artery disease) of artery bypass graft 04/21/2015   Hyperlipidemia 04/21/2015   Essential hypertension 04/21/2015   Type 2 diabetes mellitus (HCC) 04/21/2015   Ulcer of  foot (HCC) 08/22/2014   Past Medical History:  Diagnosis Date   Anxiety    Baker's cyst 10/06/2021   Bulbous urethral stricture    chronic--- hx multiple dilatation's and urolume stent's   CAD (coronary artery disease) 04/ 2011  (previous cardiologist-  [name] [name] [name] , lov 06/ 2016)  currently followed by pcp   03-03-2010  CABG x 5, LIMA to LAD, left radial artery to OM1,SVG to R1, seqSVG to PDA and PLA   Cyanosis 10/06/2021   Depression    ED (erectile dysfunction)    Essential hypertension, benign    GERD (gastroesophageal reflux disease)    History of pancreatitis 2005   Knee effusion 10/06/2021    Mixed hyperlipidemia    OSA on CPAP    not used recently   S/P CABG x 5 03/03/2010   Septic arthritis of knee, left (HCC) 11/11/2021   Type II or unspecified type diabetes mellitus without mention of complication, not stated as uncontrolled    endocrinologist-  [name] [name] [name]    Family History  Problem Relation Age of Onset   Cancer Mother        lung   Hypertension Father    CAD Father    CVA Brother     Past Surgical History:  Procedure Laterality Date   AMPUTATION Right 08/09/2021   Procedure: AMPUTATION DIGIT;  Surgeon: [name] [name], DPM;  Location: [facility];  Service: Podiatry;  Laterality: Right;   CARDIAC CATHETERIZATION  03-01-2010   [name] [name] [name]   nuclear stress test --positive moderate ishemia:  severe multivessel CAD w/ total occlusion of pLAD, HG obstrucion of pPDA with supplies collaterals around the apex to the LAD, severe stenosis distal RCA, total occlusion OM1, 75% D1, normal LVF    CORONARY ARTERY BYPASS GRAFT  03-03-2010  [name] [name]    LIMA to LAD, SVG to RI, left radial artery to OM1, seqSVG  to PDA and PLA   CYST EXCISION Left 11/02/2021   Procedure: REMOVAL OF LEFT CALF MASS;  Surgeon: [name] Kos, MD;  Location: [facility];  Service: Orthopedics;  Laterality: Left;   CYSTO W/ DILATATION , LASER INCISION OF URETHRAL STRICTURE  02-25-2005   [name] [name] [name]  [facility]   CYSTO/  DILATION URETHRAL MEATAL STENOSIS AND DILATION BULBOUS STRICTURE  12-21-2004   [name] [name] [name]  [name]   CYSTO/ RETROGRADE PYELOGRAM/ DILATATION URETHRAL STRICTURE AND PLACEMENT UROLUME STENT  08-04-2011    [name] [name] [name]  [facility]   CYSTOSCOPY WITH DIRECT VISION INTERNAL URETHROTOMY  07/15/2008   [name] [name] [name]  [facility]   and UroLume Stent placement   CYSTOSCOPY WITH URETHRAL DILATATION N/A 04/19/2018   Procedure: CYSTOSCOPY WITH BALLOON URETHRAL DILATATION;  Surgeon: [name] Gully, MD;  Location: [facility];  Service: Urology;  Laterality: N/A;   INCISION AND DRAINAGE Right  08/13/2021   Procedure: INCISION AND DRAINAGE RIGHT FOOT WOUND;  Surgeon: [name] [name], DPM;  Location: [facility];  Service: Podiatry;  Laterality: Right;   KNEE ARTHROSCOPY Left 11/15/2021   Procedure: LEFT KNEE ARTHROSCOPIC IRRIGATION AND DEBRIDEMENT;  Surgeon: [name] Kos, MD;  Location: [facility];  Service: Orthopedics;  Laterality: Left;   ORIF TOE FRACTURE Left 05/22/2013   Procedure: OPEN REDUCTION INTERNAL FIXATION (ORIF) LEFT SECOND TOE PROXIMAL PHALANX FRACTURE/LEFT PERCUTANEOUS PINNING;  Surgeon: Toni Arthurs, MD;  Location: [facility];  Service: Orthopedics;  Laterality: Left;   TONSILLECTOMY  child   TOTAL KNEE ARTHROPLASTY Left 08/14/2022   Procedure: LEFT TOTAL  KNEE ARTHROPLASTY;  Surgeon: [name] Kos, MD;  Location[name];  Service: Orthopedics;  Laterality: Left;   Social History   Occupational History   Not on file  Tobacco Use   Smoking status: Never   Smokeless tobacco: Never  Vaping Use   Vaping Use: Never used  Substance and Sexual Activity   Alcohol use: Yes    Comment: occasional   Drug use: No   Sexual activity: Yes

## 2022-11-17 ENCOUNTER — Other Ambulatory Visit: Payer: Self-pay | Admitting: *Deleted

## 2022-11-17 MED ORDER — HYDRALAZINE HCL 50 MG PO TABS: 50.0000 mg | ORAL_TABLET | Freq: Two times a day (BID) | ORAL | 3 refills | Status: DC

## 2022-11-21 ENCOUNTER — Ambulatory Visit: Payer: 59 | Admitting: Podiatry

## 2022-11-21 ENCOUNTER — Encounter: Payer: Self-pay | Admitting: Podiatry

## 2022-11-21 VITALS — BP 160/84 | HR 72

## 2022-11-21 DIAGNOSIS — M216X1 Other acquired deformities of right foot: Secondary | ICD-10-CM

## 2022-11-21 DIAGNOSIS — L84 Corns and callosities: Secondary | ICD-10-CM | POA: Diagnosis not present

## 2022-11-21 DIAGNOSIS — E1149 Type 2 diabetes mellitus with other diabetic neurological complication: Secondary | ICD-10-CM | POA: Diagnosis not present

## 2022-11-21 NOTE — Progress Notes (Signed)
Subjective: Chief Complaint  Patient presents with   Foot Ulcer    Follow up ulcer right foot   "Its just in a bad spot, hurts to walk on sometimes"    67 y.o. male presents with the above concerns.  He is continue with Betadine to the callus, wound areas on the right foot daily.  Denies any drainage or pus.  No reported swelling redness.  Denies any fevers or chills.  No other concerns.      A1c 5.9 on August 11, 2022  Objective: AAO x3, NAD DP/PT pulses palpable bilaterally, CRT less than 3 seconds On the right foot submetatarsal 2 is a hyperkeratotic lesion as well as the distal aspect of the third toe.  There appears to be some dried blood present underneath the callus but upon debridement there is no ongoing ulceration drainage or any signs of infection.  Lesions are still preulcerative.  Hammertoe contractures are present.  No pain with calf compression, swelling, warmth, erythema   Assessment: Ulceration right foot, prominent metatarsal head with hammertoe deformity  Plan: -All treatment options discussed with the patient including all alternatives, risks, complications.  -Sharply debrided the preulcerative lesion to any complications.  They appear to be preulcerative but no definitive skin breakdown.  Continue offloading he keeps quite a bit of a wrap on this I think it is good for offloading but discussed as much as it could cause increased pressure as well.  -I added a U-shaped pad around the area submetatarsal 2 to his insert. -Monitor for any clinical signs or symptoms of infection and directed to call the office immediately should any occur or go to the ER.  Return in about 4 weeks (around 12/19/2022).  Trula Slade DPM

## 2022-11-29 ENCOUNTER — Encounter: Payer: Self-pay | Admitting: Pulmonary Disease

## 2022-11-29 ENCOUNTER — Ambulatory Visit (INDEPENDENT_AMBULATORY_CARE_PROVIDER_SITE_OTHER): Payer: 59 | Admitting: Pulmonary Disease

## 2022-11-29 VITALS — BP 140/73 | HR 73 | Ht 72.0 in | Wt 229.8 lb

## 2022-11-29 DIAGNOSIS — R918 Other nonspecific abnormal finding of lung field: Secondary | ICD-10-CM | POA: Diagnosis not present

## 2022-11-29 NOTE — Patient Instructions (Signed)
Thank you for visiting Dr. Valeta Harms at [facility]. Today we recommend the following:  Orders Placed This Encounter  Procedures   CT Chest Wo Contrast   Follow up with [place] after ct chest complete.   Return in about 2 months (around 01/28/2023) for with Eric Form, NP, or Dr. Valeta Harms.    Please do your part to reduce the spread of COVID-19.

## 2022-11-29 NOTE — Progress Notes (Signed)
Synopsis: Referred in Jan 2024 for abnormal ct chest by Antony Contras, MD  Subjective:   PATIENT ID: [name] [name] GENDER: male DOB: 1956/03/17, MRN: [id number]  Chief Complaint  Patient presents with   Consult    Lung nodule    This is a 67 year old gentleman, past medical history of coronary disease, hypertension, hyperlipidemia, OSA on CPAP does not use.  Has a history of a CABG x 5.  He was referred after having a CT abdomen/pelvis which showed a right lower lobe area with small tree-in-bud infiltrate.  Reviewed his CT imaging today.  He does remember stating that he was sick during this past time.  When he had a CT scan he may have had a respiratory illness at that time.  He is a lifelong non-smoker but does have secondary smoke exposure from his parents growing up.    Past Medical History:  Diagnosis Date   Anxiety    Baker's cyst 10/06/2021   Bulbous urethral stricture    chronic--- hx multiple dilatation's and urolume stent's   CAD (coronary artery disease) 04/ 2011  (previous cardiologist-  dr h. Tamala Julian , lov 06/ 2016)  currently followed by pcp   03-03-2010  CABG x 5, LIMA to LAD, left radial artery to OM1,SVG to R1, seqSVG to PDA and PLA   Cyanosis 10/06/2021   Depression    ED (erectile dysfunction)    Essential hypertension, benign    GERD (gastroesophageal reflux disease)    History of pancreatitis 2005   Knee effusion 10/06/2021   Mixed hyperlipidemia    OSA on CPAP    not used recently   S/P CABG x 5 03/03/2010   Septic arthritis of knee, left ([place]) 11/11/2021   Type II or unspecified type diabetes mellitus without mention of complication, not stated as uncontrolled    endocrinologist-  dr Buddy Duty     Family History  Problem Relation Age of Onset   Cancer Mother        lung   Hypertension Father    CAD Father    CVA Brother      Past Surgical History:  Procedure Laterality Date   AMPUTATION Right 08/09/2021   Procedure: AMPUTATION DIGIT;  Surgeon:  Trula Slade, DPM;  Location: [facility];  Service: Podiatry;  Laterality: Right;   CARDIAC CATHETERIZATION  03-01-2010   dr Daneen Schick   nuclear stress test --positive moderate ishemia:  severe multivessel CAD w/ total occlusion of pLAD, HG obstrucion of pPDA with supplies collaterals around the apex to the LAD, severe stenosis distal RCA, total occlusion OM1, 75% D1, normal LVF    CORONARY ARTERY BYPASS GRAFT  03-03-2010  dr hendrickson    LIMA to LAD, SVG to RI, left radial artery to OM1, seqSVG  to PDA and PLA   CYST EXCISION Left 11/02/2021   Procedure: REMOVAL OF LEFT CALF MASS;  Surgeon: Leandrew Koyanagi, MD;  Location: [place];  Service: Orthopedics;  Laterality: Left;   CYSTO W/ DILATATION , LASER INCISION OF URETHRAL STRICTURE  02-25-2005   dr Karsten Ro  [facility]   CYSTO/  DILATION URETHRAL MEATAL STENOSIS AND DILATION BULBOUS STRICTURE  12-21-2004   dr Terance Hart  [facility]   CYSTO/ RETROGRADE PYELOGRAM/ DILATATION URETHRAL STRICTURE AND PLACEMENT UROLUME STENT  08-04-2011    dr Karsten Ro  [facility]   CYSTOSCOPY WITH DIRECT VISION INTERNAL URETHROTOMY  07/15/2008   dr Karsten Ro  [facility]   and UroLume Stent placement   CYSTOSCOPY WITH  URETHRAL DILATATION N/A 04/19/2018   Procedure: CYSTOSCOPY WITH BALLOON URETHRAL DILATATION;  Surgeon: Kathie Rhodes, MD;  Location: [facility];  Service: Urology;  Laterality: N/A;   INCISION AND DRAINAGE Right 08/13/2021   Procedure: INCISION AND DRAINAGE RIGHT FOOT WOUND;  Surgeon: Trula Slade, DPM;  Location: [facility];  Service: Podiatry;  Laterality: Right;   KNEE ARTHROSCOPY Left 11/15/2021   Procedure: LEFT KNEE ARTHROSCOPIC IRRIGATION AND DEBRIDEMENT;  Surgeon: Leandrew Koyanagi, MD;  Location: [place];  Service: Orthopedics;  Laterality: Left;   ORIF TOE FRACTURE Left 05/22/2013   Procedure: OPEN REDUCTION INTERNAL FIXATION (ORIF) LEFT SECOND TOE PROXIMAL PHALANX FRACTURE/LEFT PERCUTANEOUS PINNING;  Surgeon: Wylene Simmer, MD;  Location: [place];   Service: Orthopedics;  Laterality: Left;   TONSILLECTOMY  child   TOTAL KNEE ARTHROPLASTY Left 08/14/2022   Procedure: LEFT TOTAL KNEE ARTHROPLASTY;  Surgeon: Leandrew Koyanagi, MD;  Location: [place];  Service: Orthopedics;  Laterality: Left;    Social History   Socioeconomic History   Marital status: Married    Spouse name: Claiborne Billings   Number of children: 3   Years of education: Not on file   Highest education level: Not on file  Occupational History   Not on file  Tobacco Use   Smoking status: Never   Smokeless tobacco: Never  Vaping Use   Vaping Use: Never used  Substance and Sexual Activity   Alcohol use: Yes    Comment: occasional   Drug use: No   Sexual activity: Yes  Other Topics Concern   Not on file  Social History Narrative   Not on file   Social Determinants of Health   Financial Resource Strain: Not on file  Food Insecurity: No Food Insecurity (08/15/2022)   Hunger Vital Sign    Worried About Running Out of Food in the Last Year: Never true    Ran Out of Food in the Last Year: Never true  Transportation Needs: No Transportation Needs (08/15/2022)   PRAPARE - Hydrologist (Medical): No    Lack of Transportation (Non-Medical): No  Physical Activity: Not on file  Stress: Not on file  Social Connections: Not on file  Intimate Partner Violence: Not At Risk (08/15/2022)   Humiliation, Afraid, Rape, and Kick questionnaire    Fear of Current or Ex-Partner: No    Emotionally Abused: No    Physically Abused: No    Sexually Abused: No     Allergies  Allergen Reactions   Glucophage [Metformin Hcl] Other (See Comments)    Upset stomach   Hydrochlorothiazide Other (See Comments)    Hyponatremia     Outpatient Medications Prior to Visit  Medication Sig Dispense Refill   ALPRAZolam (XANAX) 0.5 MG tablet Take 0.5 mg by mouth 2 (two) times daily as needed for anxiety.     amLODipine (NORVASC) 10 MG tablet Take 10 mg by mouth at bedtime.      aspirin EC 81 MG tablet Take 1 tablet (81 mg total) by mouth in the morning and at bedtime. To be taken after surgery x 6 weeks to prevent blood clots 84 tablet 0   atorvastatin (LIPITOR) 80 MG tablet Take 80 mg by mouth every evening.     buPROPion (WELLBUTRIN XL) 300 MG 24 hr tablet Take 300 mg by mouth every morning.      cefadroxil (DURICEF) 500 MG capsule Take 1 capsule (500 mg total) by mouth 2 (two) times daily. 20 capsule  0   fenofibrate 160 MG tablet Take 160 mg by mouth every evening.     HUMALOG MIX 50/50 KWIKPEN (50-50) 100 UNIT/ML Kwikpen Inject 40 Units into the skin 3 (three) times daily before meals.     hydrALAZINE (APRESOLINE) 50 MG tablet Take 1 tablet (50 mg total) by mouth 2 (two) times daily. 180 tablet 3   metoprolol tartrate (LOPRESSOR) 100 MG tablet Take 100 mg by mouth 2 (two) times daily.     olmesartan (BENICAR) 40 MG tablet Take 40 mg by mouth daily.     omeprazole (PRILOSEC) 20 MG capsule Take 20 mg by mouth every evening.      ondansetron (ZOFRAN) 4 MG tablet Take 1 tablet (4 mg total) by mouth every 8 (eight) hours as needed for nausea or vomiting. 40 tablet 0   oxyCODONE-acetaminophen (PERCOCET) 5-325 MG tablet Take 1-2 tablets by mouth 2 (two) times daily as needed. To be taken after surgery 40 tablet 0   OZEMPIC, 1 MG/DOSE, 4 MG/3ML SOPN Inject into the skin once a week.     potassium chloride SA (K-DUR,KLOR-CON) 20 MEQ tablet Take 40 mEq by mouth every morning.     No facility-administered medications prior to visit.    Review of Systems  Constitutional:  Negative for chills, fever, malaise/fatigue and weight loss.  HENT:  Negative for hearing loss, sore throat and tinnitus.   Eyes:  Negative for blurred vision and double vision.  Respiratory:  Negative for cough, hemoptysis, sputum production, shortness of breath, wheezing and stridor.   Cardiovascular:  Negative for chest pain, palpitations, orthopnea, leg swelling and PND.  Gastrointestinal:  Negative  for abdominal pain, constipation, diarrhea, heartburn, nausea and vomiting.  Genitourinary:  Negative for dysuria, hematuria and urgency.  Musculoskeletal:  Negative for joint pain and myalgias.  Skin:  Negative for itching and rash.  Neurological:  Negative for dizziness, tingling, weakness and headaches.  Endo/Heme/Allergies:  Negative for environmental allergies. Does not bruise/bleed easily.  Psychiatric/Behavioral:  Negative for depression. The patient is not nervous/anxious and does not have insomnia.   All other systems reviewed and are negative.    Objective:  Physical Exam Vitals reviewed.  Constitutional:      General: He is not in acute distress.    Appearance: He is well-developed.  HENT:     Head: Normocephalic and atraumatic.  Eyes:     General: No scleral icterus.    Conjunctiva/sclera: Conjunctivae normal.     Pupils: Pupils are equal, round, and reactive to light.  Neck:     Vascular: No JVD.     Trachea: No tracheal deviation.  Cardiovascular:     Rate and Rhythm: Normal rate and regular rhythm.     Heart sounds: Normal heart sounds. No murmur heard. Pulmonary:     Effort: Pulmonary effort is normal. No tachypnea, accessory muscle usage or respiratory distress.     Breath sounds: No stridor. No wheezing, rhonchi or rales.  Abdominal:     General: There is no distension.     Palpations: Abdomen is soft.     Tenderness: There is no abdominal tenderness.  Musculoskeletal:        General: No tenderness.     Cervical back: Neck supple.  Lymphadenopathy:     Cervical: No cervical adenopathy.  Skin:    General: Skin is warm and dry.     Capillary Refill: Capillary refill takes less than 2 seconds.     Findings: No rash.  Neurological:  Mental Status: He is alert and oriented to person, place, and time.  Psychiatric:        Behavior: Behavior normal.      Vitals:   11/29/22 1351  BP: (!) 140/73  Pulse: 73  SpO2: 99%  Weight: 229 lb 12.8 oz (104.2  kg)  Height: 6' (1.829 m)   99% on RA BMI Readings from Last 3 Encounters:  11/29/22 31.17 kg/m  08/14/22 33.09 kg/m  08/11/22 33.97 kg/m   Wt Readings from Last 3 Encounters:  11/29/22 229 lb 12.8 oz (104.2 kg)  08/14/22 244 lb (110.7 kg)  08/11/22 250 lb 8 oz (113.6 kg)     CBC    Component Value Date/Time   WBC 11.6 (H) 08/15/2022 0216   RBC 3.86 (L) 08/15/2022 0216   HGB 10.5 (L) 08/15/2022 0216   HCT 31.9 (L) 08/15/2022 0216   PLT 220 08/15/2022 0216   MCV 82.6 08/15/2022 0216   MCH 27.2 08/15/2022 0216   MCHC 32.9 08/15/2022 0216   RDW 14.6 08/15/2022 0216   LYMPHSABS 1,511 06/07/2022 0947   MONOABS 1.2 (H) 08/21/2021 0530   EOSABS 338 06/07/2022 0947   BASOSABS 48 06/07/2022 0947     Chest Imaging: Abdominal CT chest: Right lower lobe seen under CT imaging from November reveals areas of tree-in-bud infiltrate. The patient's images have been independently reviewed by me.    Pulmonary Functions Testing Results:     No data to display          FeNO:   Pathology:   Echocardiogram:   Heart Catheterization:     Assessment & Plan:     ICD-10-CM   1. Pulmonary infiltrates  R91.8 CT Chest Wo Contrast      Discussion:  This is a 67 year old gentleman no significant pulmonary history had an abdominal CT which involved the right lower lobe found to have a nodular infiltrate, appears tree-in-bud in nature.  Small nodules.  During that time he does believe he had a respiratory tract infection may have a small area of resolving inflammation within the right base of the lung.  Plan: Repeat noncontrasted CT chest in February/March 2024. Look to see if this area has resolved on CT imaging. If it is resolved no additional follow-up needed. If persistent he may be dealing with recurrent aspiration into the right lower lobe and may benefit from a swallow evaluation. We discussed this today in detail in the office. He is on Ozempic which can cause  stomach paralysis and increased risk for aspiration events especially at nighttime.    Current Outpatient Medications:    ALPRAZolam (XANAX) 0.5 MG tablet, Take 0.5 mg by mouth 2 (two) times daily as needed for anxiety., Disp: , Rfl:    amLODipine (NORVASC) 10 MG tablet, Take 10 mg by mouth at bedtime., Disp: , Rfl:    aspirin EC 81 MG tablet, Take 1 tablet (81 mg total) by mouth in the morning and at bedtime. To be taken after surgery x 6 weeks to prevent blood clots, Disp: 84 tablet, Rfl: 0   atorvastatin (LIPITOR) 80 MG tablet, Take 80 mg by mouth every evening., Disp: , Rfl:    buPROPion (WELLBUTRIN XL) 300 MG 24 hr tablet, Take 300 mg by mouth every morning. , Disp: , Rfl:    cefadroxil (DURICEF) 500 MG capsule, Take 1 capsule (500 mg total) by mouth 2 (two) times daily., Disp: 20 capsule, Rfl: 0   fenofibrate 160 MG tablet, Take 160 mg by  mouth every evening., Disp: , Rfl:    HUMALOG MIX 50/50 KWIKPEN (50-50) 100 UNIT/ML Kwikpen, Inject 40 Units into the skin 3 (three) times daily before meals., Disp: , Rfl:    hydrALAZINE (APRESOLINE) 50 MG tablet, Take 1 tablet (50 mg total) by mouth 2 (two) times daily., Disp: 180 tablet, Rfl: 3   metoprolol tartrate (LOPRESSOR) 100 MG tablet, Take 100 mg by mouth 2 (two) times daily., Disp: , Rfl:    olmesartan (BENICAR) 40 MG tablet, Take 40 mg by mouth daily., Disp: , Rfl:    omeprazole (PRILOSEC) 20 MG capsule, Take 20 mg by mouth every evening. , Disp: , Rfl:    ondansetron (ZOFRAN) 4 MG tablet, Take 1 tablet (4 mg total) by mouth every 8 (eight) hours as needed for nausea or vomiting., Disp: 40 tablet, Rfl: 0   oxyCODONE-acetaminophen (PERCOCET) 5-325 MG tablet, Take 1-2 tablets by mouth 2 (two) times daily as needed. To be taken after surgery, Disp: 40 tablet, Rfl: 0   OZEMPIC, 1 MG/DOSE, 4 MG/3ML SOPN, Inject into the skin once a week., Disp: , Rfl:    potassium chloride SA (K-DUR,KLOR-CON) 20 MEQ tablet, Take 40 mEq by mouth every morning.,  Disp: , Rfl:    Josephine Igo, DO [facility] 11/29/2022 2:18 PM

## 2022-12-15 ENCOUNTER — Encounter: Payer: Self-pay | Admitting: Podiatry

## 2022-12-18 ENCOUNTER — Ambulatory Visit (INDEPENDENT_AMBULATORY_CARE_PROVIDER_SITE_OTHER): Payer: 59 | Admitting: Podiatry

## 2022-12-18 DIAGNOSIS — E1149 Type 2 diabetes mellitus with other diabetic neurological complication: Secondary | ICD-10-CM | POA: Diagnosis not present

## 2022-12-18 DIAGNOSIS — L97512 Non-pressure chronic ulcer of other part of right foot with fat layer exposed: Secondary | ICD-10-CM

## 2022-12-18 MED ORDER — SILVER SULFADIAZINE 1 % EX CREA: 1.0000 | TOPICAL_CREAM | Freq: Every day | CUTANEOUS | 0 refills | Status: DC

## 2022-12-18 MED ORDER — CEPHALEXIN 500 MG PO CAPS: 500.0000 mg | ORAL_CAPSULE | Freq: Three times a day (TID) | ORAL | 0 refills | Status: DC

## 2022-12-18 NOTE — Progress Notes (Signed)
Subjective: Chief Complaint  Patient presents with   Foot Problem    Right foot, blister, ball of foot     67 y.o. male presents with the above concerns.  His he has a blister to the ball of his foot.  States nothing is changed to be doing the same wraps but he has increased his walking.  He did state maybe he has not been it too much.  Denies any fevers or chills.  No drainage or pus that he reports.    A1c 5.9 on August 11, 2022  Objective: AAO x3, NAD DP/PT pulses palpable bilaterally, CRT less than 3 seconds On the right foot submetatarsal 2 is a hyperkeratotic lesion and upon debridement there was irregular ulceration measuring 0.4 x 0.3 x 0.2 cm which was not able to measure prior to debridement as it was covered with callus, old blister.  There is no probing vomiting or tunneling.  There is no surrounding erythema, ascending cellulitis.  No fluctuation or crepitation.  No malodor. No pain with calf compression, swelling, warmth, erythema   Assessment: Ulceration right foot, prominent metatarsal head with hammertoe deformity  Plan: -All treatment options discussed with the patient including all alternatives, risks, complications.  -Medically necessary wound debridement was performed.  I sharply debrided the hyperkeratotic lesion to reveal the underlying ulceration.  Pre and post wound measurements are above the wrist to the level of subcutaneous tissue.  I debrided to healthy, granular tissue.  There is no blood loss.  Silvadene was applied followed by dressing.  Recommended daily dressing changes, offloading. -I think might be cream too much padding and is causing friction discussed with increased walking.  We discussed offloading. -We discussed daily foot inspection as well as glucose control. -Monitor for any clinical signs or symptoms of infection and directed to call the office immediately should any occur or go to the ER.  Trula Slade DPM

## 2022-12-18 NOTE — Patient Instructions (Addendum)
Wash foot with soap and water, dry well.  Apply small amount of Silvadene to the wound and cover with a bandage.  Start antibiotics.  I sent Keflex to the pharmacy for you.  Monitor for any signs/symptoms of infection. Call the office immediately if any occur or go directly to the emergency room. Call with any questions/concerns.

## 2023-01-02 ENCOUNTER — Ambulatory Visit (INDEPENDENT_AMBULATORY_CARE_PROVIDER_SITE_OTHER): Payer: 59 | Admitting: Podiatry

## 2023-01-02 DIAGNOSIS — L97512 Non-pressure chronic ulcer of other part of right foot with fat layer exposed: Secondary | ICD-10-CM

## 2023-01-02 DIAGNOSIS — E1149 Type 2 diabetes mellitus with other diabetic neurological complication: Secondary | ICD-10-CM

## 2023-01-02 NOTE — Progress Notes (Signed)
Subjective: Chief Complaint  Patient presents with   Foot Ulcer    Right foot, under great hallux    67 y.o. male presents with the above concerns.  He thinks the wound is starting to fill in.  Not seen significant drainage or any pus.  No increase in swelling or redness.  He has not reported any fevers or chills.  No other concerns today.    A1c 5.9 on August 11, 2022  Objective: AAO x3, NAD DP/PT pulses palpable bilaterally, CRT less than 3 seconds On the right foot submetatarsal 2 is a hyperkeratotic lesion and upon debridement there was irregular ulceration measuring 0.3 x 0.3 x 0.2 cm which was not able to measure prior to debridement as it was covered with callus. There is no probing undermining or tunneling.  There is no surrounding erythema, ascending cellulitis.  No fluctuation or crepitation.  No malodor. No pain with calf compression, swelling, warmth, erythema   Assessment: Ulceration right foot, prominent metatarsal head with hammertoe deformity  Plan: -All treatment options discussed with the patient including all alternatives, risks, complications.  -Medically necessary wound debridement was performed.  I sharply debrided the hyperkeratotic lesion to reveal the underlying ulceration.  Pre and post wound measurements are above the level of subcutaneous tissue.  I debrided to healthy, granular tissue.  There is no blood loss.  Prisma was applied followed by dressing.  Recommended daily dressing changes, offloading. -Continue offloading.  Unfortunately given the prominence of the metatarsal head and this is what is causing the wound from not healing As well as the neuropathy. -We discussed daily foot inspection as well as glucose control. -Monitor for any clinical signs or symptoms of infection and directed to call the office immediately should any occur or go to the ER.  Trula Slade DPM

## 2023-01-15 ENCOUNTER — Other Ambulatory Visit: Payer: 59

## 2023-01-16 ENCOUNTER — Ambulatory Visit (INDEPENDENT_AMBULATORY_CARE_PROVIDER_SITE_OTHER): Payer: 59 | Admitting: Podiatry

## 2023-01-16 DIAGNOSIS — E1149 Type 2 diabetes mellitus with other diabetic neurological complication: Secondary | ICD-10-CM

## 2023-01-16 DIAGNOSIS — L97512 Non-pressure chronic ulcer of other part of right foot with fat layer exposed: Secondary | ICD-10-CM

## 2023-01-16 NOTE — Progress Notes (Signed)
Subjective: Chief Complaint  Patient presents with   Foot Ulcer    Rm 11 Follow up right ulcer check. Pt states he started having some drainage today. Pt doesn't think ulcer is closing.     67 y.o. male presents with the above concerns.  He states that the area is still present but denies any drainage or pus.  No increased swelling or redness.  No fevers or chills.  No other concerns.   A1c 5.9 on August 11, 2022  Objective: AAO x3, NAD DP/PT pulses palpable bilaterally, CRT less than 3 seconds On the right foot submetatarsal 2 is a hyperkeratotic lesion and upon debridement there still a superficial granular wound without any probing, and or tunneling.  After debridement of the wound it measured 0.2 x 0.2 x 0.1 cm.  Not able to measure the wound prior to debridement as it was covered with callus.  There is no fluctuation or crepitation.  There is no malodor.  No clinical signs of infection noted today.  There is prominence of metatarsal head plantarly No pain with calf compression, swelling, warmth, erythema   Assessment: Ulceration right foot, prominent metatarsal head with hammertoe deformity  Plan: -All treatment options discussed with the patient including all alternatives, risks, complications.  -Medically necessary wound debridement was performed.  I sharply debrided the hyperkeratotic lesion to reveal the underlying ulceration.  Pre and post wound measurements are above the level of subcutaneous tissue.  I debrided to healthy, granular tissue.  There is no blood loss.  Prisma was applied followed by dressing.  Recommended daily dressing changes, offloading. -Continue offloading.  Unfortunately given the prominence of the metatarsal head and this is what is causing the wound from not healing As well as the neuropathy we have discussed surgical intervention for this if needed.. -We discussed daily foot inspection as well as glucose control. -Monitor for any clinical signs or  symptoms of infection and directed to call the office immediately should any occur or go to the ER.  Trula Slade DPM   Foot defender boot - size 13 shoe

## 2023-01-31 ENCOUNTER — Ambulatory Visit
Admission: RE | Admit: 2023-01-31 | Discharge: 2023-01-31 | Disposition: A | Discharge status: Home / Self Care | Payer: 59 | Source: Ambulatory Visit | Attending: Pulmonary Disease | Admitting: Pulmonary Disease

## 2023-01-31 DIAGNOSIS — R918 Other nonspecific abnormal finding of lung field: Secondary | ICD-10-CM

## 2023-02-02 ENCOUNTER — Ambulatory Visit (INDEPENDENT_AMBULATORY_CARE_PROVIDER_SITE_OTHER): Payer: 59 | Admitting: Podiatry

## 2023-02-02 DIAGNOSIS — E1149 Type 2 diabetes mellitus with other diabetic neurological complication: Secondary | ICD-10-CM

## 2023-02-02 DIAGNOSIS — M216X1 Other acquired deformities of right foot: Secondary | ICD-10-CM

## 2023-02-02 DIAGNOSIS — L97512 Non-pressure chronic ulcer of other part of right foot with fat layer exposed: Secondary | ICD-10-CM | POA: Diagnosis not present

## 2023-02-02 MED ORDER — CEPHALEXIN 500 MG PO CAPS: 500.0000 mg | ORAL_CAPSULE | Freq: Three times a day (TID) | ORAL | 0 refills | Status: DC

## 2023-02-06 ENCOUNTER — Ambulatory Visit (INDEPENDENT_AMBULATORY_CARE_PROVIDER_SITE_OTHER): Payer: 59 | Admitting: Orthopaedic Surgery

## 2023-02-06 ENCOUNTER — Other Ambulatory Visit (INDEPENDENT_AMBULATORY_CARE_PROVIDER_SITE_OTHER): Payer: 59

## 2023-02-06 DIAGNOSIS — Z96652 Presence of left artificial knee joint: Secondary | ICD-10-CM

## 2023-02-06 NOTE — Progress Notes (Signed)
Post-Op Visit Note   Patient: [name] [name]           Date of Birth: 01-08-1956           MRN: [id number] Visit Date: 02/06/2023 PCP: Antony Contras, MD   Assessment & Plan:  Chief Complaint:  Chief Complaint  Patient presents with   Left Knee - Follow-up    08/14/22 left TKA   Visit Diagnoses:  1. Status post left knee replacement     Plan: Patient is a pleasant 67 year old gentleman who comes in today 6 months status post left total knee replacement 08/14/2022.  He has been doing well.  No complaints.  Examination of his left knee reveals range of motion from 0 to 125 degrees.  He is stable to valgus and varus stress.  He is neurovascular intact distally.  At this point, he will continue to advance with activity as tolerated.  Dental prophylaxis reinforced.  Follow-up in 6 months for repeat evaluation and 2 view x-rays of the left knee.  Call with concerns or questions.  Follow-Up Instructions: Return in about 6 months (around 08/09/2023).   Orders:  Orders Placed This Encounter  Procedures   XR Knee 1-2 Views Left   No orders of the defined types were placed in this encounter.   Imaging: No new imaging  PMFS History: Patient Active Problem List   Diagnosis Date Noted   Status post total left knee replacement 08/14/2022   Primary osteoarthritis of left knee 04/07/2022   Effusion, left knee 04/07/2022   Septic arthritis of knee, left ([place]) 11/11/2021   Mass of lower leg, left 11/02/2021   Amputation of right great toe ([place]) 10/31/2021   Anxiety 10/31/2021   Chronic kidney disease, stage 3b ([place]) 10/31/2021   Diabetic renal disease ([place]) 10/31/2021   Gastroesophageal reflux disease without esophagitis 10/31/2021   History of amputation of great toe ([place]) 10/31/2021   Hyperglycemia due to type 2 diabetes mellitus ([place]) 10/31/2021   Leukocytosis 10/31/2021   Long term (current) use of insulin ([place]) 10/31/2021   Malignant hypertensive chronic kidney disease 10/31/2021    Male hypogonadism 10/31/2021   Mass of left adrenal gland ([place]) 10/31/2021   Obstructive sleep apnea syndrome 10/31/2021   Other specified disorders of bladder 10/31/2021   Polyneuropathy due to type 2 diabetes mellitus ([place]) 10/31/2021   Baker's cyst, left 10/06/2021   Knee effusion, left 10/06/2021   Cyanosis 10/06/2021   Hyponatremia    AKI (acute kidney injury) ([place])    Elevated LFTs    Post-operative state    Cellulitis of left lower extremity    Streptococcal bacteremia    Cellulitis of right lower extremity    Osteomyelitis of great toe of right foot (HCC)    Osteomyelitis of second toe of right foot ([place])    Diabetes mellitus due to underlying condition with diabetic autonomic neuropathy, without long-term current use of insulin (HCC)    Class 3 severe obesity with serious comorbidity and body mass index (BMI) of 45.0 to 49.9 in adult [facility])    Toe infection 08/08/2021   CAD (coronary artery disease) of artery bypass graft 04/21/2015   Hyperlipidemia 04/21/2015   Essential hypertension 04/21/2015   Type 2 diabetes mellitus ([place]) 04/21/2015   Ulcer of foot ([place]) 08/22/2014   Past Medical History:  Diagnosis Date   Anxiety    Baker's cyst 10/06/2021   Bulbous urethral stricture    chronic--- hx multiple dilatation's and urolume stent's   CAD (  coronary artery disease) 04/ 2011  (previous cardiologist-  dr h. Tamala Julian , lov 06/ 2016)  currently followed by pcp   03-03-2010  CABG x 5, LIMA to LAD, left radial artery to OM1,SVG to R1, seqSVG to PDA and PLA   Cyanosis 10/06/2021   Depression    ED (erectile dysfunction)    Essential hypertension, benign    GERD (gastroesophageal reflux disease)    History of pancreatitis 2005   Knee effusion 10/06/2021   Mixed hyperlipidemia    OSA on CPAP    not used recently   S/P CABG x 5 03/03/2010   Septic arthritis of knee, left ([place]) 11/11/2021   Type II or unspecified type diabetes mellitus without mention of complication, not  stated as uncontrolled    endocrinologist-  dr Buddy Duty    Family History  Problem Relation Age of Onset   Cancer Mother        lung   Hypertension Father    CAD Father    CVA Brother     Past Surgical History:  Procedure Laterality Date   AMPUTATION Right 08/09/2021   Procedure: AMPUTATION DIGIT;  Surgeon: Trula Slade, DPM;  Location: [facility];  Service: Podiatry;  Laterality: Right;   CARDIAC CATHETERIZATION  03-01-2010   dr Daneen Schick   nuclear stress test --positive moderate ishemia:  severe multivessel CAD w/ total occlusion of pLAD, HG obstrucion of pPDA with supplies collaterals around the apex to the LAD, severe stenosis distal RCA, total occlusion OM1, 75% D1, normal LVF    CORONARY ARTERY BYPASS GRAFT  03-03-2010  dr hendrickson    LIMA to LAD, SVG to RI, left radial artery to OM1, seqSVG  to PDA and PLA   CYST EXCISION Left 11/02/2021   Procedure: REMOVAL OF LEFT CALF MASS;  Surgeon: Leandrew Koyanagi, MD;  Location: [place];  Service: Orthopedics;  Laterality: Left;   CYSTO W/ DILATATION , LASER INCISION OF URETHRAL STRICTURE  02-25-2005   dr Karsten Ro  [facility]   CYSTO/  DILATION URETHRAL MEATAL STENOSIS AND DILATION BULBOUS STRICTURE  12-21-2004   dr Terance Hart  [facility]   CYSTO/ RETROGRADE PYELOGRAM/ DILATATION URETHRAL STRICTURE AND PLACEMENT UROLUME STENT  08-04-2011    dr Karsten Ro  [facility]   CYSTOSCOPY WITH DIRECT VISION INTERNAL URETHROTOMY  07/15/2008   dr Karsten Ro  [facility]   and UroLume Stent placement   CYSTOSCOPY WITH URETHRAL DILATATION N/A 04/19/2018   Procedure: CYSTOSCOPY WITH BALLOON URETHRAL DILATATION;  Surgeon: Kathie Rhodes, MD;  Location: [facility];  Service: Urology;  Laterality: N/A;   INCISION AND DRAINAGE Right 08/13/2021   Procedure: INCISION AND DRAINAGE RIGHT FOOT WOUND;  Surgeon: Trula Slade, DPM;  Location: [facility];  Service: Podiatry;  Laterality: Right;   KNEE ARTHROSCOPY Left 11/15/2021   Procedure: LEFT KNEE ARTHROSCOPIC  IRRIGATION AND DEBRIDEMENT;  Surgeon: Leandrew Koyanagi, MD;  Location: [place];  Service: Orthopedics;  Laterality: Left;   ORIF TOE FRACTURE Left 05/22/2013   Procedure: OPEN REDUCTION INTERNAL FIXATION (ORIF) LEFT SECOND TOE PROXIMAL PHALANX FRACTURE/LEFT PERCUTANEOUS PINNING;  Surgeon: Wylene Simmer, MD;  Location: [place];  Service: Orthopedics;  Laterality: Left;   TONSILLECTOMY  child   TOTAL KNEE ARTHROPLASTY Left 08/14/2022   Procedure: LEFT TOTAL KNEE ARTHROPLASTY;  Surgeon: Leandrew Koyanagi, MD;  Location: [place];  Service: Orthopedics;  Laterality: Left;   Social History   Occupational History   Not on file  Tobacco Use   Smoking status:  Never   Smokeless tobacco: Never  Vaping Use   Vaping Use: Never used  Substance and Sexual Activity   Alcohol use: Yes    Comment: occasional   Drug use: No   Sexual activity: Yes

## 2023-02-09 NOTE — Progress Notes (Signed)
CT reviewed. Patient will need repeat ct chest in 1 year. Follow up to be scheduled with sg or me after the ct in 1 year  Thanks,  [place], DO [facility] 02/09/2023 5:23 PM

## 2023-02-11 NOTE — Progress Notes (Signed)
Subjective: Chief Complaint  Patient presents with   Foot Ulcer    Right foot, patient stated theres a small location on the ball of foot that wont close      67 y.o. male presents with the above concerns.  She states there is still 1 small area that we will not completely closed.  He does not report any drainage or pus or any increase in swelling or redness.  Does not have any fevers or chills.  No other concerns today.   A1c 5.9 on August 11, 2022  Objective: AAO x3, NAD DP/PT pulses palpable bilaterally, CRT less than 3 seconds On the right foot submetatarsal 2 is a hyperkeratotic lesion and upon debridement there still a superficial granular wound without any probing, and or tunneling.  After debridement of the wound it measured 0.2 x 0.2 x 0.1 cm.  It is doing about the same compared to last appointment.  There is no surrounding erythema, ascending cellulitis, there is no fluctuation or crepitation.  No malodor.  No obvious signs of infection noted today.  Prominent metatarsal head plantarly.  Previous partial first amputation.   No pain with calf compression, swelling, warmth, erythema   Assessment: Ulceration right foot, prominent metatarsal head with hammertoe deformity  Plan: -All treatment options discussed with the patient including all alternatives, risks, complications.  -Medically necessary wound debridement was performed.  I sharply debrided the hyperkeratotic lesion to reveal the underlying ulceration.  Pre and post wound measurements are above the level of subcutaneous tissue.  I debrided to healthy, granular tissue.  There is no blood loss.  Prisma was applied followed by dressing.  Recommended daily dressing changes, offloading. -Continue offloading.  Discussed total contact cast.  Not able to do this today and we discussed that if he were to proceed with this he is not able to drive. We will try for a foot defender boot (he wears a size 13). -Monitor for any clinical  signs or symptoms of infection and directed to call the office immediately should any occur or go to the ER.   Return in about 2 weeks (around 02/16/2023).  Trula Slade DPM

## 2023-02-13 ENCOUNTER — Ambulatory Visit (INDEPENDENT_AMBULATORY_CARE_PROVIDER_SITE_OTHER): Payer: 59 | Admitting: Podiatry

## 2023-02-13 ENCOUNTER — Ambulatory Visit (INDEPENDENT_AMBULATORY_CARE_PROVIDER_SITE_OTHER): Payer: 59

## 2023-02-13 DIAGNOSIS — L97512 Non-pressure chronic ulcer of other part of right foot with fat layer exposed: Secondary | ICD-10-CM

## 2023-02-13 DIAGNOSIS — M2042 Other hammer toe(s) (acquired), left foot: Secondary | ICD-10-CM | POA: Diagnosis not present

## 2023-02-13 DIAGNOSIS — M216X1 Other acquired deformities of right foot: Secondary | ICD-10-CM | POA: Diagnosis not present

## 2023-02-13 DIAGNOSIS — M2041 Other hammer toe(s) (acquired), right foot: Secondary | ICD-10-CM | POA: Diagnosis not present

## 2023-02-13 MED ORDER — DOXYCYCLINE HYCLATE 100 MG PO TABS: 100.0000 mg | ORAL_TABLET | Freq: Two times a day (BID) | ORAL | 0 refills | Status: DC

## 2023-02-19 NOTE — Progress Notes (Signed)
Subjective: Chief Complaint  Patient presents with   Foot Ulcer    diabetic with ulcer on right foot that has drainage, drainage started this morning (clear), patient wanted to make sure everything was ok     67 y.o. male presents with the above concerns.  Presents today for an acute appointment given increased drainage and he wanted to have the area checked.  Denies any increasing swelling or redness that he reports no fevers or chills.   A1c 5.9 on August 11, 2022  Objective: AAO x3, NAD DP/PT pulses palpable bilaterally, CRT less than 3 seconds On the right foot submetatarsal 2 is a hyperkeratotic lesion and upon debridement there still a superficial granular wound without any probing, and or tunneling.  After debridement of the wound it measured 0.6 x 0.5 x 0.1 cm.  Mild surrounding edema.  It does appear to be larger in diameter but not any deeper with a granular base.  There is no probing to bone, undermining or tunneling.  There is no fluctuation or crepitation.  There is no malodor.  No ascending cellulitis.  No pain with calf compression, swelling, warmth, erythema   Assessment: Ulceration right foot, prominent metatarsal head with hammertoe deformity  Plan: -All treatment options discussed with the patient including all alternatives, risks, complications.  -X-ray been reviewed of the foot.  3 views of the foot were obtained.  Status post partial first ray amputation.  Hammertoe deformities present.  No definitive cortical changes osteomyelitis at this time.  No soft tissue edema. -Medically necessary wound debridement was performed.  I sharply debrided the hyperkeratotic lesion to reveal the underlying ulceration.  Pre and post wound measurements are above the level of subcutaneous tissue.  I debrided to healthy, granular tissue.  There is no blood loss.  Medihoney dressing changes daily. -Given metatarsal head prominence as well as hammertoe will need to continue offloading in  order to help prevent this from getting any worse and help facilitate healing.  Continue padding for now. Will likely still plan for total contact cast next week.  Unfortunately foot defender boot was not covered by insurance. -Monitor for any clinical signs or symptoms of infection and directed to call the office immediately should any occur or go to the ER.  Trula Slade DPM

## 2023-02-20 ENCOUNTER — Encounter: Payer: Self-pay | Admitting: Podiatry

## 2023-02-20 ENCOUNTER — Ambulatory Visit (INDEPENDENT_AMBULATORY_CARE_PROVIDER_SITE_OTHER): Payer: 59 | Admitting: Podiatry

## 2023-02-20 DIAGNOSIS — L97512 Non-pressure chronic ulcer of other part of right foot with fat layer exposed: Secondary | ICD-10-CM | POA: Diagnosis not present

## 2023-02-21 NOTE — Progress Notes (Signed)
Subjective: Chief Complaint  Patient presents with   Foot Ulcer    Follow up ulcer right   "I think it looks much better, the medihoney seems to help"    67 y.o. male presents with the above concerns.  He presents today for total contact cast.  He states the Medihoney has been helping the wound but it still remains.  Not seeing any drainage or pus.  No swelling or redness.  No fevers or chills.   A1c 5.9 on August 11, 2022  Objective: AAO x3, NAD-wife is present. DP/PT pulses palpable bilaterally, CRT less than 3 seconds On the right foot submetatarsal 2 is a hyperkeratotic lesion and upon debridement there still a superficial granular ulceration noted.  Does appear to be smaller compared to what it was last week and there is no drainage or pus.  There is hypothyroid.  Wound not able to measure the wound prior to debridement but after debridement the wound is superficial but still present measuring 0.5 x 0.4 x 0.1 cm.  There is no probing to bone, amount or tunneling.  There is no surrounding erythema, ascending cellulitis.  There is no fluctuation or crepitation.  There is no malodor. No pain with calf compression, swelling, warmth, erythema   Assessment: Ulceration right foot, prominent metatarsal head with hammertoe deformity  Plan: -All treatment options discussed with the patient including all alternatives, risks, complications.  -Medically necessary wound debridement was performed.  I sharply debrided the hyperkeratotic lesion to reveal the underlying ulceration.  Pre and post wound measurements are above the level of subcutaneous tissue.  Not able to measure the wound prior to debridement given calcification.  I debrided to healthy, granular tissue.  There is no blood loss.  Hydrofera Blue dressing was applied.  Next well-padded total contact cast was applied making sure to pad all bony prominences.  Prior to application discussed the risks of cast including further ulceration,  infection. -I would see him back later this week for skin check given this is his first total contact cast.  If he is doing well this morning will likely choose this weekly.  No follow-ups on file.  Trula Slade DPM

## 2023-02-22 ENCOUNTER — Ambulatory Visit (INDEPENDENT_AMBULATORY_CARE_PROVIDER_SITE_OTHER): Payer: 59 | Admitting: Podiatry

## 2023-02-22 DIAGNOSIS — L97512 Non-pressure chronic ulcer of other part of right foot with fat layer exposed: Secondary | ICD-10-CM | POA: Diagnosis not present

## 2023-02-25 NOTE — Progress Notes (Signed)
Subjective: Chief Complaint  Patient presents with   Foot Ulcer    67 y.o. male presents with the above concerns.  He presents today for total contact cast change.  He was running a little bit on the leg but he kept this padded and he has not noticed any skin breakdown.  No fever or chills.  No other concerns.   A1c 5.9 on August 11, 2022  Objective: AAO x3, NAD-wife is present. DP/PT pulses palpable bilaterally, CRT less than 3 seconds On the right foot submetatarsal 2 is a hyperkeratotic lesion and upon debridement there still a superficial granular ulceration noted.  It does appear smaller today and there is no probing to bone plantarly.  No surrounding erythema, ascending cellulitis.  There is no fluctuance or crepitation.  No malodor.  No obvious signs of infection today.  There is no new ulcerations. No pain with calf compression, swelling, warmth, erythema   Assessment: Ulceration right foot, prominent metatarsal head with hammertoe deformity  Plan: -All treatment options discussed with the patient including all alternatives, risks, complications.  -Debrided minimal hyperkeratotic tissue without any complications today.  Clean the wound.  Maximal was applied followed by dressing.  A well-padded below-knee total contact cast was applied making sure to pad all bony prominences.  Again discussed with the rubbing irritation was noted.  Otherwise we will change this next week.  Return in about 1 week (around 03/01/2023) for total contact cast.  Vivi Barrack DPM

## 2023-02-27 ENCOUNTER — Encounter: Payer: Self-pay | Admitting: Podiatry

## 2023-02-27 ENCOUNTER — Telehealth: Payer: Self-pay | Admitting: Podiatry

## 2023-02-27 ENCOUNTER — Ambulatory Visit (INDEPENDENT_AMBULATORY_CARE_PROVIDER_SITE_OTHER): Payer: 59 | Admitting: Podiatry

## 2023-02-27 DIAGNOSIS — L97511 Non-pressure chronic ulcer of other part of right foot limited to breakdown of skin: Secondary | ICD-10-CM | POA: Diagnosis not present

## 2023-02-27 NOTE — Telephone Encounter (Signed)
Pt states his boot is rubbing, making a sore spot or blister. Pt wanted to come in to have someone take a look at it. No add ons to nurse schedule and no openings on Dr Mason Jim schedule today. Call was transferred to nurse triage line for assistance.

## 2023-02-28 NOTE — Progress Notes (Signed)
Subjective: Chief Complaint  Patient presents with   Wound Check     67 y.o. male presents with the above concerns.  He called this morning states that he felt the total contact cast was rubbing causing a blister.  She is been having some pain with the cast as well.  No fevers or chills.  No other concerns.   A1c 5.9 on August 11, 2022  Objective: AAO x3, NAD-wife is present. DP/PT pulses palpable bilaterally, CRT less than 3 seconds On the right foot submetatarsal 2 is a hyperkeratotic lesion that appears that the ulceration is completely healed at this time.  There is an area of superficial skin breakdown to the medial aspect of the foot.  No fluid collection noted today.  No surrounding erythema, ascending cellulitis.  There is no fluctuation or crepitation.  There is no malodor.  No obvious signs of infection.  Irritation noted on the anterior aspect of the tibia without any skin breakdown.  No drainage. No pain with calf compression, swelling, warmth, erythema   Assessment: Ulceration right foot, prominent metatarsal head ; new area skin breakdown  Plan: -All treatment options discussed with the patient including all alternatives, risks, complications.  -Total contact cast is removed.  As it was causing irritation I did not reapply this.  Recommend antibiotic ointment dressing changes daily.  Monitor for any signs or symptoms of infection. -He has surgical shoe at home we will continue with this and limited weightbearing -Monitor for any clinical signs or symptoms of infection and directed to call the office immediately should any occur or go to the ER.  Return in about 2 weeks (around 03/13/2023) for ulcer check, posible total contact cast.  Vivi Barrack DPM

## 2023-03-01 ENCOUNTER — Ambulatory Visit: Payer: 59 | Admitting: Podiatry

## 2023-03-13 ENCOUNTER — Ambulatory Visit (INDEPENDENT_AMBULATORY_CARE_PROVIDER_SITE_OTHER): Payer: 59 | Admitting: Podiatry

## 2023-03-13 DIAGNOSIS — L84 Corns and callosities: Secondary | ICD-10-CM

## 2023-03-13 DIAGNOSIS — L97511 Non-pressure chronic ulcer of other part of right foot limited to breakdown of skin: Secondary | ICD-10-CM | POA: Diagnosis not present

## 2023-03-13 DIAGNOSIS — E1149 Type 2 diabetes mellitus with other diabetic neurological complication: Secondary | ICD-10-CM | POA: Diagnosis not present

## 2023-03-13 DIAGNOSIS — M216X1 Other acquired deformities of right foot: Secondary | ICD-10-CM | POA: Diagnosis not present

## 2023-03-13 NOTE — Progress Notes (Signed)
Subjective: Chief Complaint  Patient presents with   Foot Ulcer    for ulcer check right foot, posible total contact cast PER [facility]    67 y.o. male presents with the above concerns.  He has been keeping Medihoney on the wounds.  He is doing well.  Is not had any pain in the past 4 days and no drainage or pus or swelling or redness.  Fever or chills.  He also has a callus on his left big toe.  No other concerns.  A1c 5.9 on August 11, 2022  Objective: AAO x3, NAD-wife is present. DP/PT pulses palpable bilaterally, CRT less than 3 seconds Hyperkeratotic lesion left plantar hallux without any underlying ulceration drainage or any signs of infection today. On the right foot submetatarsal 2 is a hyperkeratotic lesion with minimal amount of dried blood under the callus.  Upon debridement there was no underlying ulceration, drainage or any signs of infection.  No straight erythema, ascending cellulitis.  No fluctuation with the position.  There is no malodor.  Area of the blister on the medial aspect of the foot appears to almost healed completely.  There is no new skin breakdown today. Hammertoes present. No pain with calf compression, swelling, warmth, erythema   Assessment: Ulceration right foot, prominent metatarsal head ; new area skin breakdown- improving; left hallux callus  Plan: -All treatment options discussed with the patient including all alternatives, risks, complications.  -We did counsel the left foot or any complications or bleeding.  Continue following. -I lightly debrided the hyperkeratotic preulcerative callus on the right foot semitarsal 2.  There to be doing much better.  I did soak was applied followed by dressing.  Continue with Medihoney dressing changes daily.  Continue offloading.  Much pain signs or symptoms of infection.  As provide appears be doing much better we are going to hold off on further total contact casting for now.  No follow-ups on file.   Vivi Barrack DPM

## 2023-03-27 ENCOUNTER — Encounter: Payer: Self-pay | Admitting: Orthopaedic Surgery

## 2023-03-27 ENCOUNTER — Other Ambulatory Visit: Payer: Self-pay

## 2023-03-27 MED ORDER — AMOXICILLIN 500 MG PO TABS: ORAL_TABLET | ORAL | 2 refills | Status: DC

## 2023-03-30 ENCOUNTER — Ambulatory Visit (INDEPENDENT_AMBULATORY_CARE_PROVIDER_SITE_OTHER): Payer: 59 | Admitting: Podiatry

## 2023-03-30 DIAGNOSIS — M79675 Pain in left toe(s): Secondary | ICD-10-CM

## 2023-03-30 DIAGNOSIS — R21 Rash and other nonspecific skin eruption: Secondary | ICD-10-CM

## 2023-03-30 DIAGNOSIS — L97511 Non-pressure chronic ulcer of other part of right foot limited to breakdown of skin: Secondary | ICD-10-CM | POA: Diagnosis not present

## 2023-03-30 DIAGNOSIS — E1149 Type 2 diabetes mellitus with other diabetic neurological complication: Secondary | ICD-10-CM | POA: Diagnosis not present

## 2023-03-30 DIAGNOSIS — B351 Tinea unguium: Secondary | ICD-10-CM

## 2023-03-30 DIAGNOSIS — M79674 Pain in right toe(s): Secondary | ICD-10-CM | POA: Diagnosis not present

## 2023-03-30 MED ORDER — TRIAMCINOLONE ACETONIDE 0.025 % EX OINT: 1.0000 | TOPICAL_OINTMENT | Freq: Two times a day (BID) | CUTANEOUS | 0 refills | Status: DC

## 2023-03-30 NOTE — Progress Notes (Unsigned)
Subjective: Chief Complaint  Patient presents with   Foot Ulcer    ulcer check 2 week follow up right     67 y.o. male presents with the above concerns.  States that the wound has been doing well.  Not had any drainage or pus but still keeps a bandage on it daily.  He also developed a rash in the left leg is noticed a small flat erythema..  No recent injuries or skin breakdown or drainage.  No fevers or chills.  A1c 5.9 on August 11, 2022  Objective: AAO x3, NAD-wife is present. DP/PT pulses palpable bilaterally, CRT less than 3 seconds Nails are hypertrophic, dystrophic, brittle, discolored, elongated 10. No surrounding redness or drainage.  Tenderness nails 3 through 5 on the right and 1 through 5 on the left. On the right foot submetatarsal 2 is a hyperkeratotic lesion to the area is preulcerative but upon debridement there is no skin breakdown.  No drainage or pus.  No fluctuance or crepitation present.   On the left.  There are flat erythematous dry skin that appear dry. There is no drainage or pus.  No pustules.  No skin breakdown. Area on the leg from the cast has healed.  Hammertoes present. No pain with calf compression, swelling, warmth, erythema   Assessment: Ulceration right foot, prominent metatarsal head- improving; symptomatic onychomycosis; skin rash  Plan: -All treatment options discussed with the patient including all alternatives, risks, complications.  -Sharply debrided nails x 8 without any complications or bleeding -Prescribed triamcinolone cream for the rash- -Sharply debrided hyperkeratotic lesion with any complications.  The area is preulcerative.  He is to monitor for skin breakdown or infection.  Continue with dressing to help with further offloading. -Monitor for any clinical signs or symptoms of infection and directed to call the office immediately should any occur or go to the ER.  Return for 3-4 weeks right foot ulcer.  Vivi Barrack DPM

## 2023-04-20 ENCOUNTER — Ambulatory Visit (INDEPENDENT_AMBULATORY_CARE_PROVIDER_SITE_OTHER): Payer: 59 | Admitting: Podiatry

## 2023-04-20 DIAGNOSIS — E1149 Type 2 diabetes mellitus with other diabetic neurological complication: Secondary | ICD-10-CM

## 2023-04-20 DIAGNOSIS — M2042 Other hammer toe(s) (acquired), left foot: Secondary | ICD-10-CM | POA: Diagnosis not present

## 2023-04-20 DIAGNOSIS — L84 Corns and callosities: Secondary | ICD-10-CM | POA: Diagnosis not present

## 2023-04-20 DIAGNOSIS — M2041 Other hammer toe(s) (acquired), right foot: Secondary | ICD-10-CM | POA: Diagnosis not present

## 2023-04-26 NOTE — Progress Notes (Signed)
Subjective: Chief Complaint  Patient presents with   Foot Ulcer    Rm 13 Recheck right foot ulcer. Pt states he is healing well and ulcer looks good to him.       67 y.o. male presents with the above concerns.  States he has been doing well.  Denies any drainage or pus.  Continue with offloading, daily dressing changes.  Wound is doing much better.  No fevers or chills.  No other concerns.   A1c 5.9 on August 11, 2022  Objective: AAO x3, NAD-wife is present. DP/PT pulses palpable bilaterally, CRT less than 3 seconds On the right foot submetatarsal 2 is minimal hyperkeratotic tissue.  Peers of the ulcer is healed but is still preulcerative.  There is no edema, erythema or signs of infection.  There is no open lesions today.  Preulcerative callus the distal aspect left third toe as well without any underlying ulceration or signs of infection.  Hammertoes present. No pain with calf compression, swelling, warmth, erythema   Assessment: Preulcerative calluses right foot  Plan: -All treatment options discussed with the patient including all alternatives, risks, complications.  -Debrided preulcerative callus without any complications or bleeding.  Continue moisturizer, offloading daily.  Discussed importance of daily foot inspection and continue glucose control. -Monitor for any clinical signs or symptoms of infection and directed to call the office immediately should any occur or go to the ER.  Return for 3-4 weeks for right foot ulcer.  Vivi Barrack DPM

## 2023-05-11 ENCOUNTER — Ambulatory Visit: Payer: 59 | Admitting: Podiatry

## 2023-06-01 ENCOUNTER — Ambulatory Visit: Payer: 59 | Admitting: Podiatry

## 2023-07-02 ENCOUNTER — Ambulatory Visit: Payer: Medicare HMO | Admitting: Podiatry

## 2023-07-02 DIAGNOSIS — L84 Corns and callosities: Secondary | ICD-10-CM | POA: Diagnosis not present

## 2023-07-02 DIAGNOSIS — M2041 Other hammer toe(s) (acquired), right foot: Secondary | ICD-10-CM

## 2023-07-02 DIAGNOSIS — M2042 Other hammer toe(s) (acquired), left foot: Secondary | ICD-10-CM | POA: Diagnosis not present

## 2023-07-08 NOTE — Progress Notes (Signed)
Subjective: Chief Complaint  Patient presents with   Wound Check    BILAT NO DRAINAGE, DENIES N/V/F/C/SOB NO PAIN   Callouses    CORN RIGHT 3RD LEFT 3RD    67 y.o. male presents with the above concerns.  States he has been doing well.  He has not seen any open lesions.  Calluses have come back.  No drainage or pus.  No fevers or chills.  A1c 5.9 on August 11, 2022  Objective: AAO x3, NAD DP/PT pulses palpable bilaterally, CRT less than 3 seconds On the right foot submetatarsal 2 is no significant hyperkeratotic tissue without any ulceration noted.  Hyperkeratotic tissue noted on the plantar aspect left hallux, distal third toe without any underlying ulceration drainage or signs of infection but areas are preulcerative.  There are no open lesions noted bilaterally. Hammertoes present. No pain with calf compression, swelling, warmth, erythema   Assessment: Preulcerative calluses right foot  Plan: -All treatment options discussed with the patient including all alternatives, risks, complications.  -Debrided preulcerative callus without any complications or bleeding.  Continue moisturizer, offloading daily.  Discussed importance of daily foot inspection and continue glucose control. -Monitor for any clinical signs or symptoms of infection and directed to call the office immediately should any occur or go to the ER.  Return in about 6 weeks (around 08/13/2023) for pre-ulcerative callus; hammertoes.  Vivi Barrack DPM

## 2023-07-26 ENCOUNTER — Encounter: Payer: Self-pay | Admitting: Orthopaedic Surgery

## 2023-07-29 DIAGNOSIS — Z8249 Family history of ischemic heart disease and other diseases of the circulatory system: Secondary | ICD-10-CM | POA: Diagnosis not present

## 2023-07-29 DIAGNOSIS — I10 Essential (primary) hypertension: Secondary | ICD-10-CM | POA: Diagnosis not present

## 2023-07-29 DIAGNOSIS — E785 Hyperlipidemia, unspecified: Secondary | ICD-10-CM | POA: Diagnosis not present

## 2023-07-29 DIAGNOSIS — M2040 Other hammer toe(s) (acquired), unspecified foot: Secondary | ICD-10-CM | POA: Diagnosis not present

## 2023-07-29 DIAGNOSIS — I2510 Atherosclerotic heart disease of native coronary artery without angina pectoris: Secondary | ICD-10-CM | POA: Diagnosis not present

## 2023-07-29 DIAGNOSIS — M1990 Unspecified osteoarthritis, unspecified site: Secondary | ICD-10-CM | POA: Diagnosis not present

## 2023-07-29 DIAGNOSIS — E876 Hypokalemia: Secondary | ICD-10-CM | POA: Diagnosis not present

## 2023-07-29 DIAGNOSIS — F419 Anxiety disorder, unspecified: Secondary | ICD-10-CM | POA: Diagnosis not present

## 2023-07-29 DIAGNOSIS — Z809 Family history of malignant neoplasm, unspecified: Secondary | ICD-10-CM | POA: Diagnosis not present

## 2023-07-29 DIAGNOSIS — E119 Type 2 diabetes mellitus without complications: Secondary | ICD-10-CM | POA: Diagnosis not present

## 2023-08-16 ENCOUNTER — Encounter: Payer: Self-pay | Admitting: Pulmonary Disease

## 2023-08-22 ENCOUNTER — Encounter: Payer: Self-pay | Admitting: Pulmonary Disease

## 2023-09-01 DIAGNOSIS — J329 Chronic sinusitis, unspecified: Secondary | ICD-10-CM | POA: Diagnosis not present

## 2023-09-01 DIAGNOSIS — R0981 Nasal congestion: Secondary | ICD-10-CM | POA: Diagnosis not present

## 2023-09-01 DIAGNOSIS — R059 Cough, unspecified: Secondary | ICD-10-CM | POA: Diagnosis not present

## 2023-09-01 DIAGNOSIS — R0982 Postnasal drip: Secondary | ICD-10-CM | POA: Diagnosis not present

## 2023-09-03 ENCOUNTER — Ambulatory Visit: Payer: 59 | Admitting: Pulmonary Disease

## 2023-09-05 ENCOUNTER — Telehealth: Payer: Self-pay | Admitting: Cardiology

## 2023-09-05 NOTE — Telephone Encounter (Signed)
*  STAT* If patient is at the pharmacy, call can be transferred to refill team.   1. Which medications need to be refilled? (please list name of each medication and dose if known) hydrALAZINE (APRESOLINE) 50 MG tablet   2. Which pharmacy/location (including street and city if local pharmacy) is medication to be sent to? [facility] - [place], [place] - [address]   3. Do they need a 30 day or 90 day supply? 90    Patient has appt on 10/23

## 2023-09-06 MED ORDER — HYDRALAZINE HCL 50 MG PO TABS: 50.0000 mg | ORAL_TABLET | Freq: Two times a day (BID) | ORAL | 0 refills | Status: DC

## 2023-09-06 NOTE — Telephone Encounter (Signed)
Pt's medication was sent to pt's pharmacy as requested. Confirmation received.  °

## 2023-09-07 ENCOUNTER — Ambulatory Visit: Payer: Medicare HMO | Admitting: Acute Care

## 2023-09-07 ENCOUNTER — Encounter: Payer: Self-pay | Admitting: Acute Care

## 2023-09-07 VITALS — BP 118/62 | HR 64 | Resp 16 | Ht 72.0 in | Wt 216.4 lb

## 2023-09-07 DIAGNOSIS — Z789 Other specified health status: Secondary | ICD-10-CM | POA: Diagnosis not present

## 2023-09-07 DIAGNOSIS — R911 Solitary pulmonary nodule: Secondary | ICD-10-CM

## 2023-09-07 NOTE — Patient Instructions (Signed)
It is good to see you today. We have reviewed your March 2024 CT Chest.  Your scan showed that the tree in bud nodular densities had resolved. , and there are 2 small stable lung nodules.  I have ordered the CT Chest for 01/2024. You will get a call closer to the time to get this scheduled.  Call if you need [place] any sooner. Please contact office for sooner follow up if symptoms do not improve or worsen or seek emergency care

## 2023-09-07 NOTE — Progress Notes (Addendum)
History of Present Illness [name] [name] is a 67 y.o. male never smoker with past medical history of coronary disease, hypertension, hyperlipidemia, OSA on CPAP does not use.He was referred to Dr. Tonia Brooms January 2024 for abnormal CT Chest by Dr.Swayne, MD  Synopsis 67 year old gentleman, past medical history of coronary disease, hypertension, hyperlipidemia, OSA on CPAP does not use. Has a history of a CABG x 5. He was referred after having a CT abdomen/pelvis which showed a right lower lobe area with small tree-in-bud infiltrate. Reviewed his CT imaging today. He does remember stating that he was sick during this past time. When he had a CT scan he may have had a respiratory illness at that time. He is a lifelong non-smoker but does have secondary smoke exposure from his parents as a child .Plan at that time was for a CT Chest 01/2023, which was stable. Plan is for a follow up CT Chest 01/2024.   09/07/2023 Pt. Presents for follow up. We have been following a pulmonary nodule annually, next due 01/2024. He is here today to review his March CT Chest, and to get his follow up scan scheduled for March 2025. We reviewed his scan which showed that the previously noted tree in bud nodular densities had resolved , and there are 2 small stable lung nodules.  I have ordered the CT Chest for 01/2024. He has  intentionally lost about 100 pounds. He states he is not having to wear his CPAP machine as often. He does check his oxygen saturations and they are always fine. He has no active respiratory complaints.   Test Results: Abdominal CT chest: Right lower lobe seen under CT imaging from November reveals areas of tree-in-bud infiltrate.   CT Chest 01/31/2023 Previously described tree-in-bud nodular densities in the right lower lobe are no longer seen. 2. Small bilateral pulmonary nodules measuring up to 4 mm. No follow-up needed if patient is low-risk (and has no known or suspected primary neoplasm).  Non-contrast chest CT can be considered in 12 months if patient is high-risk. This recommendation follows the consensus statement: Guidelines for Management of Incidental Pulmonary Nodules Detected on CT Images: From the Fleischner Society 2017; Radiology 2017; 284:228-243. 3. Left adrenal nodules, compatible with myelolipomas. 4. Coronary artery calcifications. 5. Aortic atherosclerosis.    Latest Ref Rng & Units 08/15/2022    2:16 AM 08/11/2022   12:00 PM 06/07/2022    9:47 AM  CBC  WBC 4.0 - 10.5 K/uL 11.6  8.9  6.9   Hemoglobin 13.0 - 17.0 g/dL 78.2  95.6  21.3   Hematocrit 39.0 - 52.0 % 31.9  39.1  39.8   Platelets 150 - 400 K/uL 220  299  247        Latest Ref Rng & Units 08/11/2022   12:00 PM 01/19/2022    2:52 AM 11/16/2021    7:47 AM  BMP  Glucose 70 - 99 mg/dL 95  086  578   BUN 8 - 23 mg/dL 18  27  14    Creatinine 0.61 - 1.24 mg/dL 4.69  6.29  5.28   BUN/Creat Ratio 6 - 22 (calc)  14    Sodium 135 - 145 mmol/L 132  126  132   Potassium 3.5 - 5.1 mmol/L 3.9  4.9  3.8   Chloride 98 - 111 mmol/L 99  95  98   CO2 22 - 32 mmol/L 23  21  21    Calcium 8.9 - 10.3 mg/dL 9.2  9.5  9.3     BNP No results found for: "BNP"  ProBNP    Component Value Date/Time   PROBNP 144.0 (H) 03/06/2010 0330    PFT No results found for: "FEV1PRE", "FEV1POST", "FVCPRE", "FVCPOST", "TLC", "DLCOUNC", "PREFEV1FVCRT", "PSTFEV1FVCRT"  No results found.   Past medical hx Past Medical History:  Diagnosis Date   Anxiety    Baker's cyst 10/06/2021   Bulbous urethral stricture    chronic--- hx multiple dilatation's and urolume stent's   CAD (coronary artery disease) 04/ 2011  (previous cardiologist-  dr h. Katrinka Blazing , lov 06/ 2016)  currently followed by pcp   03-03-2010  CABG x 5, LIMA to LAD, left radial artery to OM1,SVG to R1, seqSVG to PDA and PLA   Cyanosis 10/06/2021   Depression    ED (erectile dysfunction)    Essential hypertension, benign    GERD (gastroesophageal reflux disease)     History of pancreatitis 2005   Knee effusion 10/06/2021   Mixed hyperlipidemia    OSA on CPAP    not used recently   S/P CABG x 5 03/03/2010   Septic arthritis of knee, left (HCC) 11/11/2021   Type II or unspecified type diabetes mellitus without mention of complication, not stated as uncontrolled    endocrinologist-  dr Sharl Ma     Social History   Tobacco Use   Smoking status: Never   Smokeless tobacco: Never  Vaping Use   Vaping status: Never Used  Substance Use Topics   Alcohol use: Yes    Comment: occasional   Drug use: No    Mr.Zapata reports that he has never smoked. He has never used smokeless tobacco. He reports current alcohol use. He reports that he does not use drugs.  Tobacco Cessation: Never smoker    Past surgical hx, Family hx, Social hx all reviewed.  Current Outpatient Medications on File Prior to Visit  Medication Sig   ALPRAZolam (XANAX) 0.5 MG tablet Take 0.5 mg by mouth 2 (two) times daily as needed for anxiety.   amLODipine (NORVASC) 10 MG tablet Take 10 mg by mouth at bedtime.   amoxicillin (AMOXIL) 500 MG tablet Take 4 tablets by mouth 1 hour prior to dental procedure.   atorvastatin (LIPITOR) 80 MG tablet Take 80 mg by mouth every evening.   buPROPion (WELLBUTRIN XL) 300 MG 24 hr tablet Take 300 mg by mouth every morning.    doxycycline (VIBRA-TABS) 100 MG tablet Take 1 tablet (100 mg total) by mouth 2 (two) times daily.   fenofibrate 160 MG tablet Take 160 mg by mouth every evening.   HUMALOG MIX 50/50 KWIKPEN (50-50) 100 UNIT/ML Kwikpen Inject 40 Units into the skin 3 (three) times daily before meals.   hydrALAZINE (APRESOLINE) 50 MG tablet Take 1 tablet (50 mg total) by mouth 2 (two) times daily.   metoprolol tartrate (LOPRESSOR) 100 MG tablet Take 100 mg by mouth 2 (two) times daily.   olmesartan (BENICAR) 40 MG tablet Take 40 mg by mouth daily.   omeprazole (PRILOSEC) 20 MG capsule Take 20 mg by mouth every evening.    ondansetron (ZOFRAN) 4  MG tablet Take 1 tablet (4 mg total) by mouth every 8 (eight) hours as needed for nausea or vomiting.   oxyCODONE-acetaminophen (PERCOCET) 5-325 MG tablet Take 1-2 tablets by mouth 2 (two) times daily as needed. To be taken after surgery   OZEMPIC, 1 MG/DOSE, 4 MG/3ML SOPN Inject into the skin once a week.   potassium chloride  SA (K-DUR,KLOR-CON) 20 MEQ tablet Take 40 mEq by mouth every morning.   silver sulfADIAZINE (SILVADENE) 1 % cream Apply 1 Application topically daily.   triamcinolone (KENALOG) 0.025 % ointment Apply 1 Application topically 2 (two) times daily.   No current facility-administered medications on file prior to visit.     Allergies  Allergen Reactions   Glucophage [Metformin Hcl] Other (See Comments)    Upset stomach   Hydrochlorothiazide Other (See Comments)    Hyponatremia    Review Of Systems:  Constitutional:   + intentional   weight loss, No night sweats,  Fevers, chills, fatigue, or  lassitude.  HEENT:   No headaches,  Difficulty swallowing,  Tooth/dental problems, or  Sore throat,                No sneezing, itching, ear ache, nasal congestion, post nasal drip,   CV:  No chest pain,  Orthopnea, PND, swelling in lower extremities, anasarca, dizziness, palpitations, syncope.   GI  No heartburn, indigestion, abdominal pain, nausea, vomiting, diarrhea, change in bowel habits, loss of appetite, bloody stools.   Resp: Occasional shortness of breath with exertion none  at rest.  No excess mucus, no productive cough,  No non-productive cough,  No coughing up of blood.  No change in color of mucus.  No wheezing.  No chest wall deformity  Skin: no rash or lesions.  GU: no dysuria, change in color of urine, no urgency or frequency.  No flank pain, no hematuria   MS:  No joint pain or swelling.  No decreased range of motion.  No back pain.  Psych:  No change in mood or affect. No depression or anxiety.  No memory loss.   Vital Signs BP 118/62   Pulse 64   Resp  16   Ht 6' (1.829 m)   Wt 216 lb 6.4 oz (98.2 kg)   SpO2 98%   BMI 29.35 kg/m    Physical Exam:  General- No distress,  A&Ox3, pleasant  ENT: No sinus tenderness, TM clear, pale nasal mucosa, no oral exudate,no post nasal drip, no LAN Cardiac: S1, S2, regular rate and rhythm, no murmur Chest: No wheeze/ rales/ dullness; no accessory muscle use, no nasal flaring, no sternal retractions Abd.: Soft Non-tender, ND, BS +, Body mass index is 29.35 kg/m.  Ext: No clubbing cyanosis, edema Neuro:  normal strength, MAE x 4, A&O x 3 Skin: No rashes, warm and dry, no lesions Psych: normal mood and behavior   Assessment/Plan Resolved tree in bud nodular densities, with 2 small stable pulmonary nodules  Never smoker Plan We have reviewed your March 2024 CT Chest.  Your scan showed that the tree in bud nodular densities had resolved. , and there are 2 small stable lung nodules.  I have ordered the CT Chest for 01/2024. You will get a call closer to the time to get this scheduled.  Call if you need [place] any sooner. Please contact office for sooner follow up if symptoms do not improve or worsen or seek emergency care    I spent 30 minutes dedicated to the care of this patient on the date of this encounter to include pre-visit review of records, face-to-face time with the patient discussing conditions above, post visit ordering of testing, clinical documentation with the electronic health record, making appropriate referrals as documented, and communicating necessary information to the patient's healthcare team.     Bevelyn Ngo, NP 09/07/2023  9:19 AM

## 2023-09-11 ENCOUNTER — Ambulatory Visit: Payer: Medicare HMO | Admitting: Podiatry

## 2023-09-11 DIAGNOSIS — M79675 Pain in left toe(s): Secondary | ICD-10-CM

## 2023-09-11 DIAGNOSIS — B351 Tinea unguium: Secondary | ICD-10-CM | POA: Diagnosis not present

## 2023-09-11 DIAGNOSIS — L84 Corns and callosities: Secondary | ICD-10-CM

## 2023-09-11 DIAGNOSIS — E1149 Type 2 diabetes mellitus with other diabetic neurological complication: Secondary | ICD-10-CM

## 2023-09-11 DIAGNOSIS — M79674 Pain in right toe(s): Secondary | ICD-10-CM | POA: Diagnosis not present

## 2023-09-11 NOTE — Progress Notes (Signed)
Subjective: No chief complaint on file.    67 y.o. male presents today for concerns of preulcerative callus as well as his nails becoming thick and elongated he is not able to trim them himself.  Recently went to [place] and walks over 10,000 steps a day he did well.  No open lesions that he reports.  He had a blister on the left big toe but this resolved.  A1c 5.9 on August 11, 2022  Objective: AAO x3, NAD DP/PT pulses palpable bilaterally, CRT less than 3 seconds On the right foot submetatarsal 2 is no significant hyperkeratotic tissue without any ulceration noted.  Hyperkeratotic tissue noted on the plantar aspect left hallux, distal third and fourth toe without any underlying ulceration drainage or signs of infection but areas are preulcerative.  There are no open lesions noted bilaterally. Nails are hypertrophic, dystrophic, brittle, discolored, elongated 10. No surrounding redness or drainage. Tenderness nails 1-5 bilaterally.  Hammertoes present. No pain with calf compression, swelling, warmth, erythema   Assessment: Preulcerative calluses right foot, symptomatic onychomycosis   Plan: -All treatment options discussed with the patient including all alternatives, risks, complications.  -Debrided preulcerative callus without any complications or bleeding x 3.  Continue moisturizer, offloading daily.  Discussed importance of daily foot inspection and continue glucose control. -Sharply debrided the toenails x 10 without any complications or bleeding.  -Monitor for any clinical signs or symptoms of infection and directed to call the office immediately should any occur or go to the ER.  Return in about 2 months (around 11/11/2023).  Vivi Barrack DPM

## 2023-09-12 ENCOUNTER — Encounter: Payer: Self-pay | Admitting: Cardiology

## 2023-09-12 ENCOUNTER — Ambulatory Visit: Payer: Medicare HMO | Attending: Cardiology | Admitting: Cardiology

## 2023-09-12 VITALS — BP 132/70 | HR 65 | Ht 72.0 in | Wt 217.0 lb

## 2023-09-12 DIAGNOSIS — I10 Essential (primary) hypertension: Secondary | ICD-10-CM

## 2023-09-12 DIAGNOSIS — E1159 Type 2 diabetes mellitus with other circulatory complications: Secondary | ICD-10-CM | POA: Diagnosis not present

## 2023-09-12 DIAGNOSIS — I25709 Atherosclerosis of coronary artery bypass graft(s), unspecified, with unspecified angina pectoris: Secondary | ICD-10-CM

## 2023-09-12 NOTE — Patient Instructions (Signed)
Medication Instructions:  Your physician recommends that you continue on your current medications as directed. Please refer to the Current Medication list given to you today.  *If you need a refill on your cardiac medications before your next appointment, please call your pharmacy*  Follow-Up: At [facility], you and your health needs are our priority.  As part of our continuing mission to provide you with exceptional heart care, we have created designated Provider Care Teams.  These Care Teams include your primary Cardiologist (physician) and Advanced Practice Providers (APPs -  Physician Assistants and Nurse Practitioners) who all work together to provide you with the care you need, when you need it.  Your next appointment:   1 year   Provider:   Donato Schultz, MD

## 2023-09-12 NOTE — Progress Notes (Signed)
Cardiology Office Note:  .   Date:  09/12/2023  ID:  [name], DOB 11-02-1956, MRN [id number] PCP: Tally Joe, MD   HeartCare Providers Cardiologist:  Donato Schultz, MD     History of Present Illness: .   DEVONAIRE SHINGLETON is a 67 y.o. male Discussed with the use of AI scribe software  History of Present Illness   A 67 year old patient with a history of coronary artery disease status post CABG in 2011, hyperlipidemia, obstructive sleep apnea, partial right second toe amputation, and primary hypertension presents for follow-up. The patient underwent left knee arthroplasty after preoperative clearance, which has improved mobility. The patient also had a history of osteomyelitis and septic arthritis leading to the loss of the right great toe.  The patient's cardiac history includes a five-fold CABG in 2011, and he has been under the care of an endocrinologist for type 2 diabetes. The patient has been on atorvastatin 80mg  for hyperlipidemia. A lower extremity ultrasound in March 2023 showed no significant bilateral peripheral artery disease. An echocardiogram in 2022 estimated an EF of 60%, mild LVH, and grade one diastolic dysfunction with no evidence of valvular vegetations. A myocardial perfusion scan in September 2023 was low risk, showing no evidence of ischemia or infarction, with an injection fraction of 63%.  The patient also has chronic kidney disease stage 3B with diabetes, with the last creatinine at 1.59. The LDL was 85, with a goal of less than 70. For hypertension, the patient continues with amlodipine 10mg , hydralazine 50mg  twice a day, metoprolol tartrate 100mg  twice a day, and olmesartan 40mg  a day. The patient is not on spironolactone due to elevated creatinine. He also takes aspirin 81mg  secondary to coronary disease. The patient has lost weight down to 217 pounds with Ozempic.  The patient reports no chest pain, and blood pressure has been within normal ranges since  starting hydralazine. The patient also reports bruising easily, which he attributes to aspirin use. The patient has been monitoring blood pressure at home, with readings usually in the 120s over 70s to 80s. The patient has been adhering to the prescribed medication regimen, including atorvastatin 80mg  for cholesterol management.            Studies Reviewed: Marland Kitchen   EKG Interpretation Date/Time:  Wednesday September 12 2023 08:42:44 EDT Ventricular Rate:  65 PR Interval:  202 QRS Duration:  98 QT Interval:  386 QTC Calculation: 401 R Axis:   53  Text Interpretation: Normal sinus rhythm Normal ECG When compared with ECG of 28-Oct-2021 11:14, No significant change was found Confirmed by Donato Schultz ([id number]) on 09/12/2023 8:45:44 AM    Results LABS Creatinine: 1.59 LDL: 85  RADIOLOGY Lower extremity ultrasound: No evidence of significant bilateral peripheral artery disease (01/2022)  DIAGNOSTIC Echocardiogram: Estimated EF 60%, mild LVH, grade 1 diastolic dysfunction, no evidence of valvular vegetations (2022) Myocardial perfusion scan: Low risk, no evidence of ischemia or infarction, EF 63% (08/10/2022) EKG: Normal (09/10/2022)  Risk Assessment/Calculations:            Physical Exam:   VS:  BP 132/70   Pulse 65   Ht 6' (1.829 m)   Wt 217 lb (98.4 kg)   SpO2 96%   BMI 29.43 kg/m    Wt Readings from Last 3 Encounters:  09/12/23 217 lb (98.4 kg)  09/07/23 216 lb 6.4 oz (98.2 kg)  11/29/22 229 lb 12.8 oz (104.2 kg)    GEN: Well nourished, well developed in no acute  distress NECK: No JVD; No carotid bruits CARDIAC: RRR, no murmurs, no rubs, no gallops, CABG scar RESPIRATORY:  Clear to auscultation without rales, wheezing or rhonchi  ABDOMEN: Soft, non-tender, non-distended EXTREMITIES:  No edema; No deformity   ASSESSMENT AND PLAN: .    Assessment and Plan    Coronary Artery Disease (CAD) status post CABG in 2011 Stable with no reported chest pain. Recent myocardial  perfusion scan (08/10/2022) showed no evidence of ischemia or infarction with an ejection fraction of 63%. Echocardiogram in 2022 showed an estimated EF of 60% with mild LVH and grade 1 diastolic dysfunction. -Continue Aspirin 81mg  daily. -Continue Atorvastatin 80mg  daily for hyperlipidemia, with a goal to reduce LDL to less than 70 through dietary modifications.  Hypertension Controlled with Amlodipine 10mg  daily, Hydralazine 50mg  twice daily (most recent new start medication by Dr. Katrinka Blazing), Metoprolol tartrate 100mg  twice daily, and Olmesartan 40mg  daily. -Continue current regimen.  Chronic Kidney Disease (CKD) stage 3B with Diabetes Last creatinine 1.59. Not on Spironolactone due to elevated creatinine. -Continue current management under Dr. Sharl Ma.  Weight Management Weight loss achieved with Ozempic. -Continue current regimen.  Follow-up in 1 year unless there are changes in condition.               Signed, Donato Schultz, MD

## 2023-09-17 ENCOUNTER — Ambulatory Visit: Payer: Medicare HMO | Admitting: Podiatry

## 2023-09-24 ENCOUNTER — Encounter: Payer: Self-pay | Admitting: Podiatry

## 2023-09-25 ENCOUNTER — Ambulatory Visit (INDEPENDENT_AMBULATORY_CARE_PROVIDER_SITE_OTHER): Payer: Medicare HMO

## 2023-09-25 ENCOUNTER — Other Ambulatory Visit: Payer: Self-pay | Admitting: Podiatry

## 2023-09-25 ENCOUNTER — Ambulatory Visit: Payer: Medicare HMO | Admitting: Podiatry

## 2023-09-25 ENCOUNTER — Encounter: Payer: Self-pay | Admitting: Podiatry

## 2023-09-25 DIAGNOSIS — M778 Other enthesopathies, not elsewhere classified: Secondary | ICD-10-CM

## 2023-09-25 DIAGNOSIS — L02619 Cutaneous abscess of unspecified foot: Secondary | ICD-10-CM

## 2023-09-25 DIAGNOSIS — L97512 Non-pressure chronic ulcer of other part of right foot with fat layer exposed: Secondary | ICD-10-CM

## 2023-09-25 DIAGNOSIS — L03119 Cellulitis of unspecified part of limb: Secondary | ICD-10-CM

## 2023-09-25 MED ORDER — DOXYCYCLINE HYCLATE 100 MG PO TABS: 100.0000 mg | ORAL_TABLET | Freq: Two times a day (BID) | ORAL | 0 refills | Status: DC

## 2023-09-25 MED ORDER — MUPIROCIN 2 % EX OINT: 1.0000 | TOPICAL_OINTMENT | Freq: Two times a day (BID) | CUTANEOUS | 2 refills | Status: DC

## 2023-09-25 NOTE — Progress Notes (Unsigned)
[place] after breaing them in

## 2023-09-27 ENCOUNTER — Ambulatory Visit (HOSPITAL_BASED_OUTPATIENT_CLINIC_OR_DEPARTMENT_OTHER): Payer: Medicare HMO

## 2023-09-28 LAB — WOUND CULTURE
MICRO NUMBER:: 15689220
SPECIMEN QUALITY:: ADEQUATE

## 2023-09-28 LAB — HOUSE ACCOUNT TRACKING

## 2023-10-03 ENCOUNTER — Encounter: Payer: Self-pay | Admitting: Podiatry

## 2023-10-03 ENCOUNTER — Ambulatory Visit: Payer: Medicare HMO | Admitting: Podiatry

## 2023-10-03 VITALS — Ht 72.0 in | Wt 217.0 lb

## 2023-10-03 DIAGNOSIS — L97511 Non-pressure chronic ulcer of other part of right foot limited to breakdown of skin: Secondary | ICD-10-CM | POA: Diagnosis not present

## 2023-10-03 DIAGNOSIS — L84 Corns and callosities: Secondary | ICD-10-CM

## 2023-10-03 DIAGNOSIS — E1149 Type 2 diabetes mellitus with other diabetic neurological complication: Secondary | ICD-10-CM

## 2023-10-03 NOTE — Progress Notes (Signed)
Subjective:   Patient ID: [name] [name], male   DOB: 67 y.o.   MRN: [id number]   HPI Patient states he is doing much better and states that he needs his corns trimmed   ROS      Objective:  Physical Exam  Neurovascular status unchanged with patient found to have ulceration from last week that was treated by Dr. Loreta Ave with antibiotics and Bactroban ointment with significant improvement and has chronic keratotic lesions digits 2 3 that are trimmed periodically with patient being high risk with history of amputation right hallux     Assessment:  Ulceration right plantar foot doing much better with reduced drainage no erythema currently and skin that is formed over the surface with secondary keratotic lesions digits 2-3 painful     Plan:  Very pleased with ulceration recovery and I have recommended finishing antibiotic continuing rigid bottom shoes and should heal uneventfully with strict instructions of any swelling redness drainage to let us know and to go to the emergency room.  I then did separate sterile debridement of digits 2-3 right no iatrogenic bleeding this will be done every 8 weeks and appointment made with Dr. Loreta Ave

## 2023-10-11 DIAGNOSIS — E1142 Type 2 diabetes mellitus with diabetic polyneuropathy: Secondary | ICD-10-CM | POA: Diagnosis not present

## 2023-10-12 ENCOUNTER — Ambulatory Visit: Payer: Medicare HMO | Admitting: Podiatry

## 2023-10-12 ENCOUNTER — Ambulatory Visit: Payer: Medicare HMO | Admitting: Acute Care

## 2023-10-24 ENCOUNTER — Ambulatory Visit (HOSPITAL_BASED_OUTPATIENT_CLINIC_OR_DEPARTMENT_OTHER): Payer: Medicare HMO

## 2023-10-29 DIAGNOSIS — N35011 Post-traumatic bulbous urethral stricture: Secondary | ICD-10-CM | POA: Diagnosis not present

## 2023-11-03 ENCOUNTER — Ambulatory Visit (HOSPITAL_BASED_OUTPATIENT_CLINIC_OR_DEPARTMENT_OTHER)
Admission: RE | Admit: 2023-11-03 | Discharge: 2023-11-03 | Disposition: A | Discharge status: Home / Self Care | Payer: Medicare HMO | Source: Ambulatory Visit | Attending: Acute Care | Admitting: Acute Care

## 2023-11-03 DIAGNOSIS — I700 Atherosclerosis of aorta: Secondary | ICD-10-CM | POA: Diagnosis not present

## 2023-11-03 DIAGNOSIS — R911 Solitary pulmonary nodule: Secondary | ICD-10-CM | POA: Insufficient documentation

## 2023-11-06 ENCOUNTER — Ambulatory Visit: Payer: Medicare HMO | Admitting: Podiatry

## 2023-11-06 ENCOUNTER — Encounter: Payer: Self-pay | Admitting: Podiatry

## 2023-11-06 DIAGNOSIS — E1149 Type 2 diabetes mellitus with other diabetic neurological complication: Secondary | ICD-10-CM

## 2023-11-06 DIAGNOSIS — L84 Corns and callosities: Secondary | ICD-10-CM | POA: Diagnosis not present

## 2023-11-06 NOTE — Progress Notes (Signed)
Subjective: Chief Complaint  Patient presents with   Diabetic Ulcer    RM#11 Follow up on ulcer right foot patient states feels like it is doing well.      67 y.o. male presents today for follow-up evaluation of blister, wound on the right foot.  States he is doing well.  Continued with daily dressing changes and keeping a pad on the area.  He is doing well.  No drainage or pus.  No fevers or chills.  He has no other concerns.  A1c 5.9 on August 11, 2022  Objective: AAO x3, NAD DP/PT pulses palpable bilaterally, CRT less than 3 seconds Right: On the right foot submetatarsal 2 is a hyperkeratotic lesion.  Upon debridement of this there is healthy, pink skin present there is no skin breakdown identified at this time.  The area still preulcerative.  Is no surrounding erythema, ascending size.  No drainage or pus or any signs of infection.  No further open lesions.  Also preulcerative calluses noted on the right third, fourth toe without any ulcerations. No pain with calf compression, swelling, warmth, erythema     Assessment: Preulcerative calluses right foot  Plan: -All treatment options discussed with the patient including all alternatives, risks, complications.  -Sharply debrided hyperkeratotic preulcerative calluses to any complications or wound.  Overall doing much better.  Still would recommend continued daily dressing changes, offloading. -Monitor for any clinical signs or symptoms of infection and directed to call the office immediately should any occur or go to the ER.  Return in about 6 weeks (around 12/18/2023).  Vivi Barrack DPM

## 2023-11-09 ENCOUNTER — Other Ambulatory Visit: Payer: Self-pay | Admitting: Urology

## 2023-11-09 ENCOUNTER — Ambulatory Visit: Payer: Medicare HMO | Admitting: Acute Care

## 2023-11-09 ENCOUNTER — Encounter: Payer: Self-pay | Admitting: Acute Care

## 2023-11-09 VITALS — BP 139/84 | HR 68 | Ht 73.0 in | Wt 228.4 lb

## 2023-11-09 DIAGNOSIS — Z7722 Contact with and (suspected) exposure to environmental tobacco smoke (acute) (chronic): Secondary | ICD-10-CM

## 2023-11-09 DIAGNOSIS — R911 Solitary pulmonary nodule: Secondary | ICD-10-CM | POA: Diagnosis not present

## 2023-11-09 DIAGNOSIS — Z23 Encounter for immunization: Secondary | ICD-10-CM | POA: Diagnosis not present

## 2023-11-09 NOTE — Patient Instructions (Addendum)
It is good to see you today.  The pulmonary nodules we have been monitoring appear stable on your CT Chest. This is good news. Plan will be for a 12 month follow up CT Chest  This will be due 10/2024. You will get a call to schedule follow up with me for about 2 weeks after the scan had been done to review results.  Call if you need [place] sooner. Please contact office for sooner follow up if symptoms do not improve or worsen or seek emergency care   Have a good holiday.

## 2023-11-09 NOTE — Progress Notes (Signed)
History of Present Illness [name] [name] is a 67 y.o. male never smoker with past medical history of coronary disease, hypertension, hyperlipidemia, OSA on CPAP does not use.He was referred to Dr. Tonia Brooms January 2024 for abnormal CT Chest by Dr.Swayne, MD   Synopsis 67 year old gentleman, past medical history of coronary disease, hypertension, hyperlipidemia, OSA on CPAP does not use. Has a history of a CABG x 5. He was referred after having a CT abdomen/pelvis which showed a right lower lobe area with small tree-in-bud infiltrate.  When he had a CT scan he may have had a respiratory illness at that time. He is a lifelong non-smoker but does have secondary smoke exposure from his parents as a child .Plan at that time was for a CT Chest 01/2023, which confirmed nodule was stable. Plan was for a follow up CT Chest March 2025.  The scan appears to be done early as patient is here today to review the results of the scan that was done December 14th 2024 .   11/09/2023 Pt.presents for follow up after CT Chest without contrast to monitor right lower lobe nodularity for stability.  The scan was originally scheduled for March 2025 however has appeared to have been done early.  The patient states he has been doing well, and has had no respiratory issues.  We have reviewed his CT scan.  CT again shows stable small perifissural nodules, that are considered benign.  No follow-up is recommended.  There was an additional notation of a 17 mm left adrenal myelolipoma, benign. The patient and I discussed whether he was comfortable with no further follow-up.  He states that he would prefer to have an annual scan to keep an eye on this area.  Plan will be for a follow-up CT chest without contrast December 2025, with follow-up in the office to review the results about 2 weeks later.  Patient is in agreement with this plan.  He knows to call sooner if he has any unexplained weight loss or hemoptysis or any other pulmonary  needs.   Test Results: Abdominal CT chest:09/2022 Right lower lobe seen under CT imaging from November 2022reveals areas of tree-in-bud infiltrate.    CT Chest 01/31/2023 Previously described tree-in-bud nodular densities in the right lower lobe are no longer seen. 2. Small bilateral pulmonary nodules measuring up to 4 mm. No follow-up needed if patient is low-risk (and has no known or suspected primary neoplasm). Non-contrast chest CT can be considered in 12 months if patient is high-risk. This recommendation follows the consensus statement: Guidelines for Management of Incidental Pulmonary Nodules Detected on CT Images: From the Fleischner Society 2017; Radiology 2017; 284:228-243. 3. Left adrenal nodules, compatible with myelolipomas. 4. Coronary artery calcifications. 5. Aortic atherosclerosis.  CT chest w/o contrast 11/03/2023 Severe three-vessel coronary atherosclerosis. Postsurgical changes related to prior CABG.   Mediastinum/Nodes: No suspicious mediastinal lymphadenopathy.   Visualized thyroid is unremarkable.   Lungs/Pleura: 3 mm perifissural nodule along the left fissure (series 4/image 64), unchanged, benign.   3 mm triangular perifissural nodule along the right major fissure (series 4/image 85), unchanged, benign.   Small calcified granulomata along the right minor fissure (series 4/images 81 and 84), benign. Additional calcified granulomata in the left upper lobe (series 4/images 54 and 58), benign.   No suspicious pulmonary nodules.   Mild subpleural reticulation in the bilateral lower lobes.   No focal consolidation.   No pleural effusion or pneumothorax.   Upper Abdomen: Visualized upper abdomen is notable  for a 17 mm left adrenal myelolipoma (series 2/image 153), benign. Vascular calcifications.   Musculoskeletal: Degenerative changes of the visualized thoracolumbar spine. Median sternotomy.   IMPRESSION: Small perifissural nodules, benign.  No  follow-up recommended.   No acute cardiopulmonary disease.   17 mm left adrenal myelolipoma, benign.   Aortic Atherosclerosis (ICD10-I70.0).      Latest Ref Rng & Units 08/15/2022    2:16 AM 08/11/2022   12:00 PM 06/07/2022    9:47 AM  CBC  WBC 4.0 - 10.5 K/uL 11.6  8.9  6.9   Hemoglobin 13.0 - 17.0 g/dL 16.1  09.6  04.5   Hematocrit 39.0 - 52.0 % 31.9  39.1  39.8   Platelets 150 - 400 K/uL 220  299  247        Latest Ref Rng & Units 08/11/2022   12:00 PM 01/19/2022    2:52 AM 11/16/2021    7:47 AM  BMP  Glucose 70 - 99 mg/dL 95  409  811   BUN 8 - 23 mg/dL 18  27  14    Creatinine 0.61 - 1.24 mg/dL 9.14  7.82  9.56   BUN/Creat Ratio 6 - 22 (calc)  14    Sodium 135 - 145 mmol/L 132  126  132   Potassium 3.5 - 5.1 mmol/L 3.9  4.9  3.8   Chloride 98 - 111 mmol/L 99  95  98   CO2 22 - 32 mmol/L 23  21  21    Calcium 8.9 - 10.3 mg/dL 9.2  9.5  9.3     BNP No results found for: "BNP"  ProBNP    Component Value Date/Time   PROBNP 144.0 (H) 03/06/2010 0330    PFT No results found for: "FEV1PRE", "FEV1POST", "FVCPRE", "FVCPOST", "TLC", "DLCOUNC", "PREFEV1FVCRT", "PSTFEV1FVCRT"  CT CHEST WO CONTRAST Result Date: 11/09/2023 CLINICAL DATA:  Follow-up pulmonary nodule EXAM: CT CHEST WITHOUT CONTRAST TECHNIQUE: Multidetector CT imaging of the chest was performed following the standard protocol without IV contrast. RADIATION DOSE REDUCTION: This exam was performed according to the departmental dose-optimization program which includes automated exposure control, adjustment of the mA and/or kV according to patient size and/or use of iterative reconstruction technique. COMPARISON:  CT chest dated 01/31/2023 FINDINGS: Cardiovascular: Heart is normal in size.  No pericardial effusion. No evidence of thoracic aortic aneurysm. Mild atherosclerotic calcifications of the aortic arch. Severe three-vessel coronary atherosclerosis. Postsurgical changes related to prior CABG. Mediastinum/Nodes: No  suspicious mediastinal lymphadenopathy. Visualized thyroid is unremarkable. Lungs/Pleura: 3 mm perifissural nodule along the left fissure (series 4/image 64), unchanged, benign. 3 mm triangular perifissural nodule along the right major fissure (series 4/image 85), unchanged, benign. Small calcified granulomata along the right minor fissure (series 4/images 81 and 84), benign. Additional calcified granulomata in the left upper lobe (series 4/images 54 and 58), benign. No suspicious pulmonary nodules. Mild subpleural reticulation in the bilateral lower lobes. No focal consolidation. No pleural effusion or pneumothorax. Upper Abdomen: Visualized upper abdomen is notable for a 17 mm left adrenal myelolipoma (series 2/image 153), benign. Vascular calcifications. Musculoskeletal: Degenerative changes of the visualized thoracolumbar spine. Median sternotomy. IMPRESSION: Small perifissural nodules, benign.  No follow-up recommended. No acute cardiopulmonary disease. 17 mm left adrenal myelolipoma, benign. Aortic Atherosclerosis (ICD10-I70.0). Electronically Signed   By: Charline Bills M.D.   On: 11/09/2023 01:40     Past medical hx Past Medical History:  Diagnosis Date   Anxiety    Baker's cyst 10/06/2021   Bulbous urethral stricture  chronic--- hx multiple dilatation's and urolume stent's   CAD (coronary artery disease) 04/ 2011  (previous cardiologist-  dr h. Katrinka Blazing , lov 06/ 2016)  currently followed by pcp   03-03-2010  CABG x 5, LIMA to LAD, left radial artery to OM1,SVG to R1, seqSVG to PDA and PLA   Cyanosis 10/06/2021   Depression    ED (erectile dysfunction)    Essential hypertension, benign    GERD (gastroesophageal reflux disease)    History of pancreatitis 2005   Knee effusion 10/06/2021   Mixed hyperlipidemia    OSA on CPAP    not used recently   S/P CABG x 5 03/03/2010   Septic arthritis of knee, left (HCC) 11/11/2021   Type II or unspecified type diabetes mellitus without mention  of complication, not stated as uncontrolled    endocrinologist-  dr Sharl Ma     Social History   Tobacco Use   Smoking status: Never   Smokeless tobacco: Never  Vaping Use   Vaping status: Never Used  Substance Use Topics   Alcohol use: Yes    Comment: occasional   Drug use: No    Mr.Ramella reports that he has never smoked. He has never used smokeless tobacco. He reports current alcohol use. He reports that he does not use drugs.  Tobacco Cessation: Counseling given: Not Answered   Past surgical hx, Family hx, Social hx all reviewed.  Current Outpatient Medications on File Prior to Visit  Medication Sig   ALPRAZolam (XANAX) 0.5 MG tablet Take 0.5 mg by mouth 2 (two) times daily as needed for anxiety.   amLODipine (NORVASC) 10 MG tablet Take 10 mg by mouth at bedtime.   amoxicillin (AMOXIL) 500 MG tablet Take 4 tablets by mouth 1 hour prior to dental procedure.   atorvastatin (LIPITOR) 80 MG tablet Take 80 mg by mouth every evening.   buPROPion (WELLBUTRIN XL) 300 MG 24 hr tablet Take 300 mg by mouth every morning.    doxycycline (VIBRA-TABS) 100 MG tablet Take 1 tablet (100 mg total) by mouth 2 (two) times daily.   doxycycline (VIBRA-TABS) 100 MG tablet Take 1 tablet (100 mg total) by mouth 2 (two) times daily.   fenofibrate 160 MG tablet Take 160 mg by mouth every evening.   HUMALOG MIX 50/50 KWIKPEN (50-50) 100 UNIT/ML Kwikpen Inject 40 Units into the skin 3 (three) times daily before meals.   hydrALAZINE (APRESOLINE) 50 MG tablet Take 1 tablet (50 mg total) by mouth 2 (two) times daily.   metoprolol tartrate (LOPRESSOR) 100 MG tablet Take 100 mg by mouth 2 (two) times daily.   mupirocin ointment (BACTROBAN) 2 % Apply 1 Application topically 2 (two) times daily.   olmesartan (BENICAR) 40 MG tablet Take 40 mg by mouth daily.   omeprazole (PRILOSEC) 20 MG capsule Take 20 mg by mouth every evening.    ondansetron (ZOFRAN) 4 MG tablet Take 1 tablet (4 mg total) by mouth every 8  (eight) hours as needed for nausea or vomiting.   oxyCODONE-acetaminophen (PERCOCET) 5-325 MG tablet Take 1-2 tablets by mouth 2 (two) times daily as needed. To be taken after surgery   OZEMPIC, 1 MG/DOSE, 4 MG/3ML SOPN Inject into the skin once a week.   potassium chloride SA (K-DUR,KLOR-CON) 20 MEQ tablet Take 40 mEq by mouth every morning.   silver sulfADIAZINE (SILVADENE) 1 % cream Apply 1 Application topically daily.   triamcinolone (KENALOG) 0.025 % ointment Apply 1 Application topically 2 (two) times daily.  No current facility-administered medications on file prior to visit.     Allergies  Allergen Reactions   Glucophage [Metformin Hcl] Other (See Comments)    Upset stomach   Hydrochlorothiazide Other (See Comments)    Hyponatremia    Review Of Systems:  Constitutional:   No  weight loss, night sweats,  Fevers, chills, fatigue, or  lassitude.  HEENT:   No headaches,  Difficulty swallowing,  Tooth/dental problems, or  Sore throat,                No sneezing, itching, ear ache, nasal congestion, post nasal drip,   CV:  No chest pain,  Orthopnea, PND, swelling in lower extremities, anasarca, dizziness, palpitations, syncope.   GI  No heartburn, indigestion, abdominal pain, nausea, vomiting, diarrhea, change in bowel habits, loss of appetite, bloody stools.   Resp: No shortness of breath with exertion or at rest.  No excess mucus, no productive cough,  No non-productive cough,  No coughing up of blood.  No change in color of mucus.  No wheezing.  No chest wall deformity  Skin: no rash or lesions.  GU: no dysuria, change in color of urine, no urgency or frequency.  No flank pain, no hematuria   MS:  No joint pain or swelling.  No decreased range of motion.  No back pain.  Psych:  No change in mood or affect. No depression or anxiety.  No memory loss.   Vital Signs BP 139/84 (BP Location: Left Arm, Cuff Size: Large)   Pulse 68   Ht 6\' 1"  (1.854 m)   Wt 228 lb 6.4 oz  (103.6 kg)   SpO2 97%   BMI 30.13 kg/m    Physical Exam:  General- No distress,  A&Ox3, pleasant and appropriate ENT: No sinus tenderness, TM clear, pale nasal mucosa, no oral exudate,no post nasal drip, no LAN Cardiac: S1, S2, regular rate and rhythm, no murmur Chest: No wheeze/ rales/ dullness; no accessory muscle use, no nasal flaring, no sternal retractions, slightly diminished per bases Abd.: Soft Non-tender, nondistended, bowel sounds positive,Body mass index is 30.13 kg/m.  Ext: No clubbing cyanosis, edema Neuro:  normal strength, moving all extremities x 4, alert and oriented x 3, appropriate Skin: No rashes, warm and dry, no lesions Psych: normal mood and behavior   Assessment/Plan Stable small perifissural nodules, benign per radiology Never smoker with significant secondhand smoke exposure as a child Plan The pulmonary nodules we have been monitoring appear stable on your CT Chest. This is good news. Plan will be for a 12 month follow up CT Chest  This will be due 10/2024. You will get a call to schedule both the CT scan and the follow up with me for about 2 weeks after the scan has been done to review results.  Call if you need [place] sooner. Please contact office for sooner follow up if symptoms do not improve or worsen or seek emergency care    I spent 30 minutes dedicated to the care of this patient on the date of this encounter to include pre-visit review of records, face-to-face time with the patient discussing conditions above, post visit ordering of testing, clinical documentation with the electronic health record, making appropriate referrals as documented, and communicating necessary information to the patient's healthcare team.    Bevelyn Ngo, NP 11/09/2023  10:19 AM

## 2023-11-16 ENCOUNTER — Ambulatory Visit: Payer: Medicare HMO | Admitting: Podiatry

## 2023-11-20 ENCOUNTER — Other Ambulatory Visit: Payer: Self-pay

## 2023-11-20 ENCOUNTER — Encounter (HOSPITAL_BASED_OUTPATIENT_CLINIC_OR_DEPARTMENT_OTHER): Payer: Self-pay | Admitting: Urology

## 2023-11-20 NOTE — Anesthesia Preprocedure Evaluation (Addendum)
 Anesthesia Evaluation  Patient identified by MRN, date of birth, ID band Patient awake    Reviewed: Allergy & Precautions, H&P , NPO status , Patient's Chart, lab work & pertinent test results  Airway Mallampati: II   Neck ROM: full    Dental   Pulmonary sleep apnea (hasnt been using CPAP since weight loss)    breath sounds clear to auscultation       Cardiovascular hypertension, + CAD and + CABG   Rhythm:regular Rate:Normal  Echo 2022  1. Left ventricular ejection fraction, by estimation, is 55 to 60%. The  left ventricle has normal function. The left ventricle has no regional  wall motion abnormalities. There is mild left ventricular hypertrophy.  Left ventricular diastolic parameters  are consistent with Grade I diastolic dysfunction (impaired relaxation).   2. Right ventricular systolic function is normal. The right ventricular  size is normal. Tricuspid regurgitation signal is inadequate for assessing  PA pressure.   3. The mitral valve is grossly normal. Trivial mitral valve  regurgitation. No evidence of mitral stenosis.   4. The aortic valve is tricuspid. Aortic valve regurgitation is not  visualized. No aortic stenosis is present.   5. The inferior vena cava is normal in size with greater than 50%  respiratory variability, suggesting right atrial pressure of 3 mmHg.    Low risk stress test last year     Neuro/Psych  PSYCHIATRIC DISORDERS Anxiety Depression       GI/Hepatic ,GERD  ,,  Endo/Other  diabetes, Type 2    Renal/GU Renal InsufficiencyRenal disease     Musculoskeletal  (+) Arthritis ,    Abdominal   Peds  Hematology   Anesthesia Other Findings ozempic  Reproductive/Obstetrics                             Anesthesia Physical Anesthesia Plan  ASA: 3  Anesthesia Plan: General   Post-op Pain Management:    Induction: Intravenous  PONV Risk Score and Plan: 2 and  Ondansetron , Dexamethasone , Treatment may vary due to age or medical condition and Midazolam   Airway Management Planned: LMA  Additional Equipment:   Intra-op Plan:   Post-operative Plan: Extubation in OR  Informed Consent: I have reviewed the patients History and Physical, chart, labs and discussed the procedure including the risks, benefits and alternatives for the proposed anesthesia with the patient or authorized representative who has indicated his/her understanding and acceptance.     Dental advisory given  Plan Discussed with: CRNA, Anesthesiologist and Surgeon  Anesthesia Plan Comments: (Approved for [facility] without further cardiac clearance- had cards clearance/workup within the last 2 years for knee surgery. No new symptoms  -finucane)       Anesthesia Quick Evaluation

## 2023-11-20 NOTE — Progress Notes (Addendum)
 Spoke w/ via phone for pre-op interview---[name] [name] needs dos----  ISTAT       [name] results------09/12/2023 EKG in chart & Epic COVID test -----patient states asymptomatic no test needed Arrive at -------1115 on Monday, 11/26/23. NPO after MN NO Solid Food.  Clear liquids from MN until---1015 Med rec completed Medications to take morning of surgery -----Xanax  prn, Wellbutrin , Hydralazine , Metoprolol  Diabetic medication -----Hold Ozempic x 7 days. Per pt, last dose before surgery was on 11/16/23. Do not take any insulin  the morning of surgery. Patient instructed no nail polish to be worn day of surgery Patient instructed to bring photo id and insurance card day of surgery Patient aware to have Driver (ride ) / caregiver    for 24 hours after surgery - wife, [name] Patient Special Instructions -----none Pre-Op special Instructions -----Orders need second sign. Patient verbalized understanding of instructions that were given at this phone interview. Patient denies chest pain, sob, fever, cough at the interview.   The patient has a history of CAD s/p CABG x 5 in 2011, HTN, HLP, DM2, CKD 3 and OSA (patient states he's lost weight and no longer needs CPAP.) He follows w/ Dr. Oneil Parchment, cardiologist, LOV 09/12/2023.  Echo: 2022 EF 60%, mild  LVH, and grade one diastolic dysfunctions with no evidence of valvular vegetations Myocardial Perfusion Study: 07/2022 in Epic , low risk, showing no evidence of ischemia or infarction, with an ejection fraction on 63%  I reviewed this case with Dr. Merla, MDA on 11/20/2023. Per Dr. Merla, patient does not need cardiac clearance for surgery on 11/26/23.

## 2023-11-26 ENCOUNTER — Encounter (HOSPITAL_BASED_OUTPATIENT_CLINIC_OR_DEPARTMENT_OTHER): Payer: Self-pay | Admitting: Urology

## 2023-11-26 ENCOUNTER — Ambulatory Visit (HOSPITAL_BASED_OUTPATIENT_CLINIC_OR_DEPARTMENT_OTHER): Payer: Medicare HMO | Admitting: Anesthesiology

## 2023-11-26 ENCOUNTER — Ambulatory Visit (HOSPITAL_BASED_OUTPATIENT_CLINIC_OR_DEPARTMENT_OTHER)
Admission: RE | Admit: 2023-11-26 | Discharge: 2023-11-26 | Disposition: A | Discharge status: Home / Self Care | Payer: Medicare HMO | Attending: Urology | Admitting: Urology

## 2023-11-26 ENCOUNTER — Encounter (HOSPITAL_BASED_OUTPATIENT_CLINIC_OR_DEPARTMENT_OTHER)
Admission: RE | Disposition: A | Discharge status: Home / Self Care | Payer: Self-pay | Source: Home / Self Care | Attending: Urology

## 2023-11-26 DIAGNOSIS — E119 Type 2 diabetes mellitus without complications: Secondary | ICD-10-CM | POA: Diagnosis not present

## 2023-11-26 DIAGNOSIS — N35912 Unspecified bulbous urethral stricture, male: Secondary | ICD-10-CM | POA: Diagnosis not present

## 2023-11-26 DIAGNOSIS — I2510 Atherosclerotic heart disease of native coronary artery without angina pectoris: Secondary | ICD-10-CM | POA: Insufficient documentation

## 2023-11-26 DIAGNOSIS — Z951 Presence of aortocoronary bypass graft: Secondary | ICD-10-CM | POA: Insufficient documentation

## 2023-11-26 DIAGNOSIS — I129 Hypertensive chronic kidney disease with stage 1 through stage 4 chronic kidney disease, or unspecified chronic kidney disease: Secondary | ICD-10-CM | POA: Diagnosis not present

## 2023-11-26 DIAGNOSIS — N35812 Other urethral bulbous stricture, male: Secondary | ICD-10-CM | POA: Diagnosis not present

## 2023-11-26 DIAGNOSIS — G4733 Obstructive sleep apnea (adult) (pediatric): Secondary | ICD-10-CM | POA: Diagnosis not present

## 2023-11-26 DIAGNOSIS — I10 Essential (primary) hypertension: Secondary | ICD-10-CM | POA: Diagnosis not present

## 2023-11-26 DIAGNOSIS — N1832 Chronic kidney disease, stage 3b: Secondary | ICD-10-CM

## 2023-11-26 DIAGNOSIS — Z01818 Encounter for other preprocedural examination: Secondary | ICD-10-CM

## 2023-11-26 DIAGNOSIS — K219 Gastro-esophageal reflux disease without esophagitis: Secondary | ICD-10-CM | POA: Diagnosis not present

## 2023-11-26 DIAGNOSIS — N5201 Erectile dysfunction due to arterial insufficiency: Secondary | ICD-10-CM | POA: Diagnosis not present

## 2023-11-26 DIAGNOSIS — N3289 Other specified disorders of bladder: Secondary | ICD-10-CM | POA: Diagnosis not present

## 2023-11-26 HISTORY — DX: Chronic kidney disease, unspecified: N18.9

## 2023-11-26 HISTORY — PX: CYSTOSCOPY WITH URETHRAL DILATATION: SHX5125

## 2023-11-26 HISTORY — DX: Presence of spectacles and contact lenses: Z97.3

## 2023-11-26 HISTORY — DX: Solitary pulmonary nodule: R91.1

## 2023-11-26 LAB — POCT I-STAT, CHEM 8
BUN: 22 mg/dL (ref 8–23)
Calcium, Ion: 1.17 mmol/L (ref 1.15–1.40)
Chloride: 102 mmol/L (ref 98–111)
Creatinine, Ser: 1.9 mg/dL — ABNORMAL HIGH (ref 0.61–1.24)
Glucose, Bld: 107 mg/dL — ABNORMAL HIGH (ref 70–99)
HCT: 40 % (ref 39.0–52.0)
Hemoglobin: 13.6 g/dL (ref 13.0–17.0)
Potassium: 3.7 mmol/L (ref 3.5–5.1)
Sodium: 139 mmol/L (ref 135–145)
TCO2: 22 mmol/L (ref 22–32)

## 2023-11-26 LAB — GLUCOSE, CAPILLARY: Glucose-Capillary: 101 mg/dL — ABNORMAL HIGH (ref 70–99)

## 2023-11-26 SURGERY — CYSTOSCOPY, WITH URETHRAL DILATION
Anesthesia: General | Site: Renal

## 2023-11-26 SURGERY — Surgical Case
Anesthesia: *Unknown

## 2023-11-26 MED ORDER — FENTANYL CITRATE (PF) 250 MCG/5ML IJ SOLN
INTRAMUSCULAR | Status: DC | PRN
  Administered 2023-11-26: 50 ug via INTRAVENOUS

## 2023-11-26 MED ORDER — CEFAZOLIN SODIUM-DEXTROSE 2-4 GM/100ML-% IV SOLN
2.0000 g | INTRAVENOUS | Status: AC
  Administered 2023-11-26: 2 g via INTRAVENOUS

## 2023-11-26 MED ORDER — PROPOFOL 10 MG/ML IV BOLUS
INTRAVENOUS | Status: DC | PRN
  Administered 2023-11-26: 200 mg via INTRAVENOUS

## 2023-11-26 MED ORDER — OXYCODONE HCL 5 MG PO TABS
ORAL_TABLET | ORAL | Status: AC
  Filled 2023-11-26: qty 1

## 2023-11-26 MED ORDER — DEXAMETHASONE SODIUM PHOSPHATE 10 MG/ML IJ SOLN
INTRAMUSCULAR | Status: DC | PRN
  Administered 2023-11-26: 8 mg via INTRAVENOUS

## 2023-11-26 MED ORDER — PROPOFOL 10 MG/ML IV BOLUS
INTRAVENOUS | Status: AC
  Filled 2023-11-26: qty 20

## 2023-11-26 MED ORDER — OXYCODONE HCL 5 MG/5ML PO SOLN
5.0000 mg | Freq: Once | ORAL | Status: AC | PRN
Start: 2023-11-26 — End: 2023-11-26

## 2023-11-26 MED ORDER — LIDOCAINE 2% (20 MG/ML) 5 ML SYRINGE
INTRAMUSCULAR | Status: DC | PRN
  Administered 2023-11-26: 60 mg via INTRAVENOUS

## 2023-11-26 MED ORDER — OXYCODONE HCL 5 MG PO TABS
5.0000 mg | ORAL_TABLET | Freq: Once | ORAL | Status: AC | PRN
  Administered 2023-11-26: 5 mg via ORAL

## 2023-11-26 MED ORDER — ONDANSETRON HCL 4 MG/2ML IJ SOLN
INTRAMUSCULAR | Status: DC | PRN
  Administered 2023-11-26: 4 mg via INTRAVENOUS

## 2023-11-26 MED ORDER — FENTANYL CITRATE (PF) 100 MCG/2ML IJ SOLN
INTRAMUSCULAR | Status: AC
  Filled 2023-11-26: qty 2

## 2023-11-26 MED ORDER — IOHEXOL 300 MG/ML  SOLN
INTRAMUSCULAR | Status: DC | PRN
  Administered 2023-11-26: 10 mL

## 2023-11-26 MED ORDER — FENTANYL CITRATE (PF) 100 MCG/2ML IJ SOLN: 25.0000 ug | INTRAMUSCULAR | Status: DC | PRN

## 2023-11-26 MED ORDER — CEFAZOLIN SODIUM-DEXTROSE 2-4 GM/100ML-% IV SOLN
INTRAVENOUS | Status: AC
Start: 2023-11-26 — End: ?
  Filled 2023-11-26: qty 100

## 2023-11-26 MED ORDER — EPHEDRINE SULFATE-NACL 50-0.9 MG/10ML-% IV SOSY
PREFILLED_SYRINGE | INTRAVENOUS | Status: DC | PRN
  Administered 2023-11-26 (×2): 5 mg via INTRAVENOUS

## 2023-11-26 MED ORDER — STERILE WATER FOR IRRIGATION IR SOLN
Status: DC | PRN
  Administered 2023-11-26: 500 mL

## 2023-11-26 MED ORDER — SODIUM CHLORIDE 0.9 % IV SOLN: INTRAVENOUS | Status: DC

## 2023-11-26 MED ORDER — ONDANSETRON HCL 4 MG/2ML IJ SOLN: 4.0000 mg | Freq: Four times a day (QID) | INTRAMUSCULAR | Status: DC | PRN

## 2023-11-26 MED ORDER — MIDAZOLAM HCL 2 MG/2ML IJ SOLN
INTRAMUSCULAR | Status: AC
Start: 2023-11-26 — End: ?
  Filled 2023-11-26: qty 2

## 2023-11-26 MED ORDER — STERILE WATER FOR IRRIGATION IR SOLN
Status: DC | PRN
  Administered 2023-11-26: 3000 mL

## 2023-11-26 MED ORDER — OXYCODONE-ACETAMINOPHEN 5-325 MG PO TABS: 1.0000 | ORAL_TABLET | Freq: Two times a day (BID) | ORAL | 0 refills | Status: DC | PRN

## 2023-11-26 MED ORDER — MIDAZOLAM HCL 2 MG/2ML IJ SOLN
INTRAMUSCULAR | Status: DC | PRN
  Administered 2023-11-26: 2 mg via INTRAVENOUS

## 2023-11-26 SURGICAL SUPPLY — 26 items
BAG URINE DRAIN 2000ML AR STRL (UROLOGICAL SUPPLIES) ×1 IMPLANT
BALLN NEPHROSTOMY (BALLOONS)
BALLN OPTILUME DCB 30X3X75 (BALLOONS)
BALLN OPTILUME DCB 30X5X75 (BALLOONS)
BALLOON NEPHROSTOMY (BALLOONS) IMPLANT
BALLOON OPTILUME DCB 30X3X75 (BALLOONS) IMPLANT
BALLOON OPTILUME DCB 30X5X75 (BALLOONS) IMPLANT
CATH FOLEY 2W COUNCIL 20FR 5CC (CATHETERS) IMPLANT
CATH FOLEY 2W COUNCIL 5CC 18FR (CATHETERS) IMPLANT
CATH ROBINSON RED A/P 14FR (CATHETERS) ×1 IMPLANT
CATH SET URETHRAL DILATOR (CATHETERS) IMPLANT
CATH URET 5FR 70CM CONE TIP (BALLOONS) IMPLANT
CATH URETL OPEN END 6FR 70 (CATHETERS) IMPLANT
CLOTH BEACON ORANGE TIMEOUT ST (SAFETY) ×1 IMPLANT
DRSG TELFA 3X8 NADH STRL (GAUZE/BANDAGES/DRESSINGS) IMPLANT
GLOVE BIO SURGEON STRL SZ7.5 (GLOVE) ×1 IMPLANT
GOWN STRL REUS W/TWL LRG LVL3 (GOWN DISPOSABLE) ×1 IMPLANT
GUIDEWIRE ANG ZIPWIRE 038X150 (WIRE) IMPLANT
GUIDEWIRE STR DUAL SENSOR (WIRE) ×1 IMPLANT
MANIFOLD NEPTUNE II (INSTRUMENTS) IMPLANT
NS IRRIG 1000ML POUR BTL (IV SOLUTION) IMPLANT
PACK CYSTO (CUSTOM PROCEDURE TRAY) ×1 IMPLANT
PENCIL SMOKE EVACUATOR (MISCELLANEOUS) IMPLANT
SYR 10ML LL (SYRINGE) IMPLANT
WATER STERILE IRR 3000ML UROMA (IV SOLUTION) ×1 IMPLANT
WATER STERILE IRR 500ML POUR (IV SOLUTION) IMPLANT

## 2023-11-26 NOTE — Anesthesia Procedure Notes (Signed)
 Procedure Name: LMA Insertion Date/Time: 11/26/2023 1:22 PM  Performed by: Cena Epps, CRNAPre-anesthesia Checklist: Patient identified, Emergency Drugs available, Suction available and Patient being monitored Patient Re-evaluated:Patient Re-evaluated prior to induction Oxygen Delivery Method: Circle System Utilized Preoxygenation: Pre-oxygenation with 100% oxygen Induction Type: IV induction Ventilation: Mask ventilation without difficulty LMA: LMA inserted LMA Size: 5.0 Number of attempts: 1 Airway Equipment and Method: Bite block Placement Confirmation: positive ETCO2 Tube secured with: Tape Dental Injury: Teeth and Oropharynx as per pre-operative assessment

## 2023-11-26 NOTE — Transfer of Care (Signed)
 Immediate Anesthesia Transfer of Care Note  Patient: [name] [name]  Procedure(s) Performed: URETHRAL DILATATION, BIOPSY OF URETHRA, RETROGRADE URETHOGRAM (Renal)  Patient Location: PACU  Anesthesia Type:General  Level of Consciousness: awake, alert , and oriented  Airway & Oxygen Therapy: Patient Spontanous Breathing  Post-op Assessment: Report given to RN and Post -op Vital signs reviewed and stable  Post vital signs: Reviewed and stable  Last Vitals:  Vitals Value Taken Time  BP 113/60 11/26/23 1358  Temp    Pulse 58 11/26/23 1359  Resp 11 11/26/23 1359  SpO2 99 % 11/26/23 1359  Vitals shown include unfiled device data.  Last Pain:  Vitals:   11/26/23 1137  TempSrc: Oral  PainSc: 0-No pain      Patients Stated Pain Goal: 5 (11/26/23 1137)  Complications: No notable events documented.

## 2023-11-26 NOTE — Op Note (Signed)
 Operative Note  Preoperative diagnosis:  1.  Bulbar urethral stricture  Postoperative diagnosis: 1.  Bulbar urethral stricture  Procedure(s): 1.  Cystoscopy 2. Retrograde urethrogram 3. Biopsy of bulbar urethral stricture 4. Urethral dilation  Surgeon: Donnice Siad, MD  Assistants:  None  Anesthesia:  General  Complications:  None  EBL:  Minimal  Specimens: 1. None  Drains/Catheters: 1.  18 French council catheter  Intraoperative findings:   Retrograde urethrogram was performed that demonstrated approximately 10 French bulbar urethral stricture approximately 3 cm in length. Cystoscopy demonstrated dense bulbar urethral stricture approximately 3 cm in length, initially 10 French and after dilation was 24 French urethral dilators, easily passed one of the bladder.  Surveillance cystoscopy demonstrated severely trabeculated and distended bladder however no suspicious bladder lesions. Successful 18 French council catheter placed over wire with return of clear yellow urine.  Indication:  [name] [name] is a 68 y.o. male with a history of urethral stricture who is here today for urethral dilation.  Description of procedure: After informed consent was obtained from the patient, the patient was identified and taken to the operating room and placed in the supine position.  General anesthesia was administered as well as perioperative IV antibiotics.  At the beginning of the case, a time-out was performed to properly identify the patient, the surgery to be performed, and the surgical site.  Sequential compression devices were applied to the lower extremities at the beginning of the case for DVT prophylaxis.  The patient was then placed in the dorsal lithotomy supine position, prepped and draped in sterile fashion.  I then passed the 21-French rigid cystoscope through the urethra up the the level of the stricture. I advanced a 0.038 sensor wire across the stricture and into the bladder. I  then exchanged the bridge for a rigid biopsy and a portion of the stricture was biopsied.  This appeared to be edematous and not papillary nature.  This was sent for pathology for evaluation.  Patient was returned and scope was removed.  I then successfully dilated the stricture from 8 French to 24 French without difficulty.  Following this, I navigated the 21 French cystoscope through the urethra into the bladder noting a severe trabeculated bladder however no evidence of any suspicious bladder lesion.  I then removed the scope and over the wire, passed an 29 French council catheter over the wire.  There was return of clear yellow urine. The patient was subsequently awoken and returned to PACU stable condition.  Plan:  Void trial in office as scheduled, in around 72 hours.  [name] MD [facility]  Pager: [phone]

## 2023-11-26 NOTE — H&P (Signed)
 Office Visit Report     10/29/2023   --------------------------------------------------------------------------------   Elsie BROCKS. Revolorio  MRN: [id number]  DOB: [birth year], 68 year old Male  SSN: [id number]   PRIMARY CARE:  Alm Rav, MD  PRIMARY CARE FAX:  [phone]  REFERRING:  Alm Rav, MD  PROVIDER:  Garnette Shack, M.D.  TREATING:  Donnice Siad, M.D.  LOCATION:  [facility] [phone]     --------------------------------------------------------------------------------   CC/HPI: Reinhold Rickey is a 68 year old male who is seen in follow with history of bulbar urethral stricture.   1. Urethral stricture:  -He has a history of direct visual internal urethrotomy in 2009 and UroLift stent in 06/2008. He then required dilation with filiforms and followers in 02/2010. He underwent balloon dilation in 07/2011 and placement of a second more distal ureter lumen stent at that time. He was seen by Dr. Ottelin in 2019 with a history of 5 years progressive worsening force of stream. He denied prior history of sexually manage infections, trauma or indwelling Foley catheter.  -He underwent balloon dilation of urethral stricture in 03/2018.  -Since 2019, he is undergoing CIC approximately every 6 weeks. He attempted CIC this morning after he noticed some sensation of bladder emptying and was unable to pass the catheter. His IPSS score is 19, quality-of-life 3.  -Cystoscopy 10/29/2023 with bulbar urethral stricture approximately 10 French as well as bullous edema with a benign appearing polyp present. Unable to pass 14 French catheter. He states that he has been voiding somewhat better today. PVR is 195 mL.   #2. Erectile dysfunction: Has been well treated with Trimix in the past.     ALLERGIES: hydrochlorothiazide  Metformin    MEDICATIONS: Metoprolol  Tartrate 100 mg tablet  Omeprazole 40 mg capsule,delayed release  Triple Mix PGE1 40 Micrograms, Papaverine 30 mg, phentolamine 1  mg and lidocaine  10 mg/mL Dispense 10 cc  Viagra 100 mg tablet  Acc-Chek Fastclix Lancets  Alli CAPS Oral  ALPRAZolam  TABS Oral  Aspirin  325 MG Oral Tablet Oral  Benicar  Hct 40 mg-25 mg tablet Oral  Fenofibrate 160 mg tablet  Fish Oil  Humalog   Lipitor 80 mg tablet Oral  Lovaza  1 gram capsule Oral  Novofine  Onetouch Ultra Test Strips strip In Vitro  Ozempic  Potassium Chloride  20 meq tablet, extended release  Tresiba Flextouch U-100 100 unit/ml (3 ml) insulin  pen  Ultra-Fine Original Pen Needle 29 gauge x 1/2 needle, disposable Does Not Apply  Viagra 100 mg tablet Oral  Victoza 18 MG/3ML SOLN Subcutaneous  Wellbutrin  Xl 300 mg tablet, extended release 24 hr Oral     GU PSH: Cysto Dilate Stricture (M or F) - 2019, 2011 Cystoscopy - 2019 Cystoscopy And Treatment - 2009 Cystoscopy VIU - 2009, 2009 Cystoscopy; Implant Stent - 2012 Penile Injection - 2019       PSH Notes: Cystoscopy With Insertion Of Permanent Urethral Stent, Cystoscopy For Urethral Stricture, Coronary Artery Bypass Graft (CABG), Cystoscopy With Insertion Of Urethral Stent, Cystoscopy With Internal Urethrotomy, Direct Vision, Cystoscopy With Internal Urethrotomy, Direct Vision, Cystoscopy For Urethral Stricture With Steroid Injection   NON-GU PSH: CABG (coronary artery bypass grafting) - 2011     GU PMH: Abnormal radiologic findings on diagnostic imaging of renal pelvis, ureter, or bladder, Normal-appearing bladder wall for somebody that has had prior obstruction. He is voiding well now and properly performing self dilations - 05/29/2022 ED due to arterial insufficiency, He uses Trimix successfully. - 05/29/2022, Pt ED symptoms well managed on trimix, -  2021 (Stable), He had a moderate response to 0.5 cc so we discussed increasing the dosage incrementally by 0.2 cc. We discussed the risk of priapism and also I recommended varying the location to prevent scar tissue and curvature of the penis., - 2019, He has elected  to use Muse at a 1000 mcg dose. This was sent in to his pharmacy., - 2019, Erectile dysfunction due to arterial insufficiency, - 2014 Urethral Stricture, Unspec, He has had 3 prior balloon dilations. He is voiding well, seems to be emptying well and is on self dilation - 05/29/2022, Urethral stricture, - 2014 Membranous urethral stricture, Pt urethral stricture is managed with occasional CIC. Pt asymptomatic and requires no intervention at this time. - 2021 Bulbar urethral stricture, He has had a recurrence of his bulbar urethral stricture. It is in the very deep bulb placing it in close approximation to the sphincter. We therefore discussed balloon dilation. He may require self catheterization/dilatation to maintain this patent over time. - 2019 Encounter for Prostate Cancer screening, His prostate was noted to be smooth and benign. He has not had a PSA done recently. A screening PSA will therefore be obtained today. - 2019 Personal Hx Oth Urinary System diseases, History of urethral stricture - 2014      PMH Notes: Urethral stricture disease: In 4/09 he was seen evaluated and treated for a deep bulbar urethral stricture with visual internal urethrotomy and injection of steroids to try to prevent re-formation of the stricture.  He redevelop stricture and in 8/09 underwent placement of a UroLume stent.  He then required dilatation with filiforms and followers at the time of an unrelated surgery in 4/11.  Balloon dilatation 08/04/11 with placement of a 2nd, more distal UroLume stent.  He redeveloped a bulbar urethral stricture and underwent balloon dilation on 04/19/18. We discussed the options for long-term management and he elected to proceed with CIC/self dilatation.     NON-GU PMH: Contusion of scrotum and testes, initial encounter - 2019 Pyuria/other UA findings, He did have some pyuria and bacteriuria today so I will culture his urine in preparation for his surgery. - 2019 Anxiety, Anxiety -  2014 Personal history of other diseases of the circulatory system, History of cardiac disorder - 2014, History of hypertension, - 2014 Personal history of other diseases of the digestive system, History of esophageal reflux - 2014 Personal history of other diseases of the nervous system and sense organs, History of sleep apnea - 2014 Personal history of other endocrine, nutritional and metabolic disease, History of hypercholesterolemia - 2014, History of diabetes mellitus, - 2014 Personal history of other mental and behavioral disorders, History of depression - 2014 Diabetes Type 2 GERD Hypercholesterolemia Hypertension Sleep Apnea    FAMILY HISTORY: 1 Daughter - Runs in Family 2 sons - Runs in Family Death In The Family Father - Father Death In The Family Mother - Mother Family Health Status Number - Runs In Family   SOCIAL HISTORY: Marital Status: Married Preferred Language: English; Ethnicity: Not Hispanic Or Latino; Race: White Current Smoking Status: Patient has never smoked.   Tobacco Use Assessment Completed: Used Tobacco in last 30 days? Social Drinker.  Drinks 4+ caffeinated drinks per day. Patient's occupation [employer].     Notes: Never A Smoker, Tobacco Use, Caffeine Use, Marital History - Currently Married, Alcohol Use, Occupation:   REVIEW OF SYSTEMS:    GU Review Male:   Patient denies frequent urination, hard to postpone urination, burning/ pain with  urination, get up at night to urinate, leakage of urine, stream starts and stops, trouble starting your stream, have to strain to urinate , erection problems, and penile pain.  Gastrointestinal (Upper):   Patient denies nausea, indigestion/ heartburn, and vomiting.  Gastrointestinal (Lower):   Patient denies diarrhea and constipation.  Constitutional:   Patient denies fever, night sweats, weight loss, and fatigue.  Skin:   Patient denies skin rash/ lesion and itching.  Eyes:   Patient denies  blurred vision and double vision.  Ears/ Nose/ Throat:   Patient denies sore throat and sinus problems.  Hematologic/Lymphatic:   Patient denies swollen glands and easy bruising.  Cardiovascular:   Patient denies leg swelling and chest pains.  Respiratory:   Patient denies cough and shortness of breath.  Endocrine:   Patient denies excessive thirst.  Musculoskeletal:   Patient denies back pain and joint pain.  Neurological:   Patient denies headaches and dizziness.  Psychologic:   Patient denies depression and anxiety.   VITAL SIGNS:      10/29/2023 11:31 AM  Weight 215 lb / 97.52 kg  Height 72 in / 182.88 cm  BP 145/87 mmHg  Pulse 69 /min  Temperature 97.1 F / 36.1 C  BMI 29.2 kg/m   MULTI-SYSTEM PHYSICAL EXAMINATION:    Constitutional: Well-nourished. No physical deformities. Normally developed. Good grooming.  Respiratory: No labored breathing, no use of accessory muscles.   Cardiovascular: Normal temperature, normal extremity pulses, no swelling, no varicosities.  Gastrointestinal: No mass, no tenderness, no rigidity, non obese abdomen.     Complexity of Data:  Source Of History:  Patient, Medical Record Summary  Records Review:   AUA Symptom Score, Previous Doctor Records, Previous Hospital Records, Previous Patient Records  Urine Test Review:   Urinalysis   05/29/22 02/12/18  PSA  Total PSA 1.01 ng/mL 2.19 ng/mL    04/20/05  Hormones  Testosterone, Total 3.49     PROCEDURES:         Flexible Cystoscopy - 52000  Risks, benefits, and some of the potential complications of the procedure were discussed at length with the patient including infection, bleeding, voiding discomfort, urinary retention, fever, chills, sepsis, and others. All questions were answered. Informed consent was obtained. Antibiotic prophylaxis was given. Sterile technique and intraurethral analgesia were used.  Meatus:  Normal size. Normal location. Normal condition.  Urethra:  Bulbar urethral  stricture approximately 10 French a few benign-appearing urethral polyps present.  External Sphincter:  Normal.  Verumontanum:  Normal.      The lower urinary tract was carefully examined. The procedure was well-tolerated and without complications. Antibiotic instructions were given. Instructions were given to call the office immediately for bloody urine, difficulty urinating, urinary retention, painful or frequent urination, fever, chills, nausea, vomiting or other illness. The patient stated that he understood these instructions and would comply with them.        PVR Ultrasound - 48201  Scanned Volume: 195 cc         Visit Complexity - G2211          Urinalysis Dipstick Dipstick Cont'd  Color: Yellow Bilirubin: Neg mg/dL  Appearance: Clear Ketones: Neg mg/dL  Specific Gravity: 8.989 Blood: Neg ery/uL  pH: 7.0 Protein: Trace mg/dL  Glucose: Neg mg/dL Urobilinogen: 0.2 mg/dL    Nitrites: Neg    Leukocyte Esterase: Neg leu/uL    ASSESSMENT:      ICD-10 Details  1 GU:   Urethral Stricture, Unspec - N35.9   2  ED due to arterial insufficiency - N52.01    PLAN:           Document Letter(s):  Created for Patient: Clinical Summary         Notes:    1. Bulbar urethral stricture: We discussed options and he like to proceed with Optilume balloon dilation and retrograde urethrogram. This is risk-benefit including risk of infection, hematuria, recurrence. Also will plan on biopsy of urethral polyp. Surgery letter submitted. Discussed that more definitive procedure would entail urethroplasty. This time, he is not interested.   2. Erectile function: Continue Trimix as needed.     Urology Preoperative H&P   Chief Complaint: Bulbar urethral stricture, urethral lesion  History of Present Illness: BRAYTON BAUMGARTNER is a 68 y.o. male with bulbar urethral stricture with urethral lesion here for urethral dilation and biopsy of urethra. Denies fevers, chills, dysuria.    Past Medical  History:  Diagnosis Date   Anxiety    Baker's cyst 10/06/2021   Bulbous urethral stricture    chronic--- hx multiple dilatation's and urolume stent's   CAD (coronary artery disease) 04/ 2011  (previous cardiologist-  dr h. claudene , lov 06/ 2016)  currently followed by pcp   03-03-2010  CABG x 5, LIMA to LAD, left radial artery to OM1,SVG to R1, seqSVG to PDA and PLA   Chronic kidney disease    Stage 3b, Follows w/ PCP.   Cyanosis 10/06/2021   right foot   Depression    Follows w/ PCP, Dr. Alm Rav.   ED (erectile dysfunction)    Essential hypertension, benign    Follows w/ PCP and cardiologist, Dr. Oneil Parchment.   GERD (gastroesophageal reflux disease)    History of pancreatitis 2005   Knee effusion 10/06/2021   Lung nodule    11/03/23 Chest CT, Patient follows w/ Pulmonolgy, LOV 11/09/23.   Mixed hyperlipidemia    OSA on CPAP    hx of OSA when patient was over 300 lbs, no longer uses CPAP since losing weight   S/P CABG x 5 03/03/2010   Septic arthritis of knee, left (HCC) 11/11/2021   Type II or unspecified type diabetes mellitus without mention of complication, not stated as uncontrolled    endocrinologist-  dr faythe / As of 11/20/23, patient states that he only uses insulin  if his blood surgar get's really high. Controlling DM with Ozempic and diet.   Wears glasses    reading only    Past Surgical History:  Procedure Laterality Date   AMPUTATION Right 08/09/2021   Procedure: AMPUTATION DIGIT;  Surgeon: Gershon Donnice SAUNDERS, DPM;  Location: [facility];  Service: Podiatry;  Laterality: Right;   CARDIAC CATHETERIZATION  03-01-2010   dr victory claudene   nuclear stress test --positive moderate ishemia:  severe multivessel CAD w/ total occlusion of pLAD, HG obstrucion of pPDA with supplies collaterals around the apex to the LAD, severe stenosis distal RCA, total occlusion OM1, 75% D1, normal LVF    CORONARY ARTERY BYPASS GRAFT  03-03-2010  dr hendrickson    LIMA to LAD, SVG to RI, left  radial artery to OM1, seqSVG  to PDA and PLA   CYST EXCISION Left 11/02/2021   Procedure: REMOVAL OF LEFT CALF MASS;  Surgeon: Jerri Kay HERO, MD;  Location: [facility];  Service: Orthopedics;  Laterality: Left;   CYSTO W/ DILATATION , LASER INCISION OF URETHRAL STRICTURE  02-25-2005   dr ceil  [facility]   CYSTO/  DILATION URETHRAL  MEATAL STENOSIS AND DILATION BULBOUS STRICTURE  12-21-2004   dr andra  [facility]   CYSTO/ RETROGRADE PYELOGRAM/ DILATATION URETHRAL STRICTURE AND PLACEMENT UROLUME STENT  08-04-2011    dr ceil  [facility]   CYSTOSCOPY WITH DIRECT VISION INTERNAL URETHROTOMY  07/15/2008   dr ceil  [facility]   and UroLume Stent placement   CYSTOSCOPY WITH URETHRAL DILATATION N/A 04/19/2018   Procedure: CYSTOSCOPY WITH BALLOON URETHRAL DILATATION;  Surgeon: Ottelin, Mark, MD;  Location: [facility];  Service: Urology;  Laterality: N/A;   INCISION AND DRAINAGE Right 08/13/2021   Procedure: INCISION AND DRAINAGE RIGHT FOOT WOUND;  Surgeon: Gershon Donnice SAUNDERS, DPM;  Location: [facility];  Service: Podiatry;  Laterality: Right;   KNEE ARTHROSCOPY Left 11/15/2021   Procedure: LEFT KNEE ARTHROSCOPIC IRRIGATION AND DEBRIDEMENT;  Surgeon: Jerri Kay HERO, MD;  Location: [facility];  Service: Orthopedics;  Laterality: Left;   ORIF TOE FRACTURE Left 05/22/2013   Procedure: OPEN REDUCTION INTERNAL FIXATION (ORIF) LEFT SECOND TOE PROXIMAL PHALANX FRACTURE/LEFT PERCUTANEOUS PINNING;  Surgeon: Norleen Armor, MD;  Location: [facility];  Service: Orthopedics;  Laterality: Left;   TONSILLECTOMY  child   TOTAL KNEE ARTHROPLASTY Left 08/14/2022   Procedure: LEFT TOTAL KNEE ARTHROPLASTY;  Surgeon: Jerri Kay HERO, MD;  Location: [facility];  Service: Orthopedics;  Laterality: Left;    Allergies:  Allergies  Allergen Reactions   Glucophage [Metformin Hcl] Other (See Comments)    Upset stomach   Hydrochlorothiazide  Other (See Comments)    Hyponatremia    Family History  Problem Relation Age of Onset    Cancer Mother        lung   Hypertension Father    CAD Father    CVA Brother     Social History:  reports that he has never smoked. He has been exposed to tobacco smoke. He has never used smokeless tobacco. He reports current alcohol use. He reports that he does not use drugs.  ROS: A complete review of systems was performed.  All systems are negative except for pertinent findings as noted.  Physical Exam:  Vital signs in last 24 hours: Temp:  [97.6 F (36.4 C)] 97.6 F (36.4 C) (01/06 1137) Pulse Rate:  [69] 69 (01/06 1137) Resp:  [17] 17 (01/06 1137) BP: (140)/(82) 140/82 (01/06 1137) SpO2:  [100 %] 100 % (01/06 1137) Weight:  [105.4 kg] 105.4 kg (01/06 1137) Constitutional:  Alert and oriented, No acute distress Cardiovascular: Regular rate and rhythm Respiratory: Normal respiratory effort, Lungs clear bilaterally GI: Abdomen is soft, nontender, nondistended, no abdominal masses GU: No CVA tenderness Lymphatic: No lymphadenopathy Neurologic: Grossly intact, no focal deficits Psychiatric: Normal mood and affect  Laboratory Data:  Recent Labs    11/26/23 1135  HGB 13.6  HCT 40.0    Recent Labs    11/26/23 1135  NA 139  K 3.7  CL 102  GLUCOSE 107*  BUN 22  CREATININE 1.90*     Results for orders placed or performed during the hospital encounter of 11/26/23 (from the past 24 hours)  I-STAT, chem 8     Status: Abnormal   Collection Time: 11/26/23 11:35 AM  Result Value Ref Range   Sodium 139 135 - 145 mmol/L   Potassium 3.7 3.5 - 5.1 mmol/L   Chloride 102 98 - 111 mmol/L   BUN 22 8 - 23 mg/dL   Creatinine, Ser 8.09 (H) 0.61 - 1.24 mg/dL   Glucose, Bld 892 (H) 70 - 99 mg/dL  Calcium , Ion 1.17 1.15 - 1.40 mmol/L   TCO2 22 22 - 32 mmol/L   Hemoglobin 13.6 13.0 - 17.0 g/dL   HCT 59.9 60.9 - 47.9 %   No results found for this or any previous visit (from the past 240 hours).  Renal Function: Recent Labs    11/26/23 1135  CREATININE 1.90*   Estimated  Creatinine Clearance: 49.4 mL/min (A) (by C-G formula based on SCr of 1.9 mg/dL (H)).  Radiologic Imaging: No results found.  I independently reviewed the above imaging studies.  Assessment and Plan MOSE COLAIZZI is a 68 y.o. male with bulbar urethral stricture here for cysto, urethral dilation, biopsy of urethra.   Matt R. Viraj Liby MD 11/26/2023, 1:00 PM  [facility] Pager: [phone]

## 2023-11-26 NOTE — Discharge Instructions (Signed)
  Post Anesthesia Home Care Instructions  Activity: Get plenty of rest for the remainder of the day. A responsible individual must stay with you for 24 hours following the procedure.  For the next 24 hours, DO NOT: -Drive a car -Operate machinery -Drink alcoholic beverages -Take any medication unless instructed by your physician -Make any legal decisions or sign important papers.  Meals: Start with liquid foods such as gelatin or soup. Progress to regular foods as tolerated. Avoid greasy, spicy, heavy foods. If nausea and/or vomiting occur, drink only clear liquids until the nausea and/or vomiting subsides. Call your physician if vomiting continues.  Special Instructions/Symptoms: Your throat may feel dry or sore from the anesthesia or the breathing tube placed in your throat during surgery. If this causes discomfort, gargle with warm salt water. The discomfort should disappear within 24 hours.  CYSTOSCOPY HOME CARE INSTRUCTIONS  Activity: Rest for the remainder of the day.  Do not drive or operate equipment today.  You may resume normal activities in one to two days as instructed by your physician.   Meals: Drink plenty of liquids and eat light foods such as gelatin or soup this evening.  You may return to a normal meal plan tomorrow.  Return to Work: You may return to work in one to two days or as instructed by your physician.  Special Instructions / Symptoms: Call your physician if any of these symptoms occur:   -persistent or heavy bleeding  -bleeding which continues after first few urination  -large blood clots that are difficult to pass  -urine stream diminishes or stops completely  -fever equal to or higher than 101 degrees Farenheit.  -cloudy urine with a strong, foul odor  -severe pain  Females should always wipe from front to back after elimination.  You may feel some burning pain when you urinate.  This should disappear with time.  Applying moist heat to the lower  abdomen or a hot tub bath may help relieve the pain.   

## 2023-11-27 ENCOUNTER — Encounter (HOSPITAL_BASED_OUTPATIENT_CLINIC_OR_DEPARTMENT_OTHER): Payer: Self-pay | Admitting: Urology

## 2023-11-28 LAB — SURGICAL PATHOLOGY

## 2023-11-29 DIAGNOSIS — N35011 Post-traumatic bulbous urethral stricture: Secondary | ICD-10-CM | POA: Diagnosis not present

## 2023-11-30 NOTE — Anesthesia Postprocedure Evaluation (Signed)
 Anesthesia Post Note  Patient: [name] [name]  Procedure(s) Performed: URETHRAL DILATATION, BIOPSY OF URETHRA, RETROGRADE URETHOGRAM (Renal)     Patient location during evaluation: PACU Anesthesia Type: General Level of consciousness: awake and alert Pain management: pain level controlled Vital Signs Assessment: post-procedure vital signs reviewed and stable Respiratory status: spontaneous breathing, nonlabored ventilation, respiratory function stable and patient connected to nasal cannula oxygen Cardiovascular status: blood pressure returned to baseline and stable Postop Assessment: no apparent nausea or vomiting Anesthetic complications: no   No notable events documented.  Last Vitals:  Vitals:   11/26/23 1430 11/26/23 1520  BP: 123/67 138/77  Pulse: (!) 57 (!) 59  Resp: 14 16  Temp:  (!) 36.3 C  SpO2: 99% 99%    Last Pain:  Vitals:   11/28/23 1037  TempSrc:   PainSc: 0-No pain                 Jaisa Defino S

## 2023-12-07 ENCOUNTER — Other Ambulatory Visit: Payer: Self-pay | Admitting: Cardiology

## 2023-12-14 DIAGNOSIS — Z1159 Encounter for screening for other viral diseases: Secondary | ICD-10-CM | POA: Diagnosis not present

## 2023-12-18 DIAGNOSIS — Z008 Encounter for other general examination: Secondary | ICD-10-CM | POA: Diagnosis not present

## 2023-12-20 ENCOUNTER — Ambulatory Visit: Payer: Medicare HMO | Admitting: Podiatry

## 2024-01-11 DIAGNOSIS — H25813 Combined forms of age-related cataract, bilateral: Secondary | ICD-10-CM | POA: Diagnosis not present

## 2024-01-11 DIAGNOSIS — E119 Type 2 diabetes mellitus without complications: Secondary | ICD-10-CM | POA: Diagnosis not present

## 2024-01-17 ENCOUNTER — Ambulatory Visit: Payer: Medicare HMO | Admitting: Podiatry

## 2024-01-17 ENCOUNTER — Encounter: Payer: Self-pay | Admitting: Podiatry

## 2024-01-17 DIAGNOSIS — E08621 Diabetes mellitus due to underlying condition with foot ulcer: Secondary | ICD-10-CM

## 2024-01-17 DIAGNOSIS — L97512 Non-pressure chronic ulcer of other part of right foot with fat layer exposed: Secondary | ICD-10-CM

## 2024-01-17 DIAGNOSIS — Z5189 Encounter for other specified aftercare: Secondary | ICD-10-CM

## 2024-01-17 DIAGNOSIS — L02611 Cutaneous abscess of right foot: Secondary | ICD-10-CM | POA: Diagnosis not present

## 2024-01-17 MED ORDER — SILVER SULFADIAZINE 1 % EX CREA: 1.0000 | TOPICAL_CREAM | Freq: Every day | CUTANEOUS | 0 refills | Status: DC

## 2024-01-17 MED ORDER — DOXYCYCLINE HYCLATE 100 MG PO TABS: 100.0000 mg | ORAL_TABLET | Freq: Two times a day (BID) | ORAL | 0 refills | Status: DC

## 2024-01-18 NOTE — Progress Notes (Signed)
 Subjective: Chief Complaint  Patient presents with   Foot Ulcer    RM#14 Follow up on right foot ulcer patient states was doing well until last night developed a blister.      68 y.o. male presents today for follow-up evaluation of blister, wound on the right foot.  States he just noticed a blister yesterday otherwise is doing well until then.  Has not had any recent treatment.  No injuries.  No changes in shoe gear. Denies any fevers or chills.  He has no other concerns.  A1c 5.9 on August 11, 2022  Objective: AAO x3, NAD DP/PT pulses palpable bilaterally, CRT less than 3 seconds Right: On the right foot submetatarsal 2 is a hyperkeratotic lesion.  There is barely a blister with this.  Once I debrided this superficial purulence is noted underneath the callus and after debridement there is a granular wound present without any probing to [place] or tunneling.  There is no fluctuation or crepitation otherwise.  There is no odor.  No ascending cellulitis. No pain with calf compression, swelling, warmth, erythema   Assessment: Superficial abscess right foot  Plan: -All treatment options discussed with the patient including all alternatives, risks, complications.  -I have a due to the hyperkeratotic lesion, blister.  Purulence was identified and this was cultured today.  I debrided down to healthy, granular tissue.  After debrided the area measured 1.5 x 0.8 x 0.1 cm.  I was not able to measure the wound prior to debridement.  I utilized #161 with scalpel debride down to healthy tissue.  Cleaned the area with saline.  Silvadene applied followed by dressing.  Continue daily dressing changes.  Offloading. -Prescribed doxycycline -Monitor for any clinical signs or symptoms of infection and directed to call the office immediately should any occur or go to the ER.  Return in about 1 week (around 01/24/2024).  Vivi Barrack DPM

## 2024-01-20 LAB — WOUND CULTURE
MICRO NUMBER:: 16140533
SPECIMEN QUALITY:: ADEQUATE

## 2024-01-21 ENCOUNTER — Ambulatory Visit: Payer: Medicare HMO | Admitting: Podiatry

## 2024-01-22 ENCOUNTER — Encounter: Payer: Self-pay | Admitting: Podiatry

## 2024-01-22 ENCOUNTER — Ambulatory Visit: Payer: Medicare HMO | Admitting: Podiatry

## 2024-01-22 DIAGNOSIS — L02611 Cutaneous abscess of right foot: Secondary | ICD-10-CM

## 2024-01-24 ENCOUNTER — Ambulatory Visit: Payer: Medicare HMO | Admitting: Podiatry

## 2024-01-25 NOTE — Progress Notes (Signed)
 Subjective: Chief Complaint  Patient presents with   Foot Ulcer    RM#11 Follow up right foot ulcer no signs of infection today looks to be in healing state.      68 y.o. male presents today for follow-up evaluation of blister, wound on the right foot.  States he is doing much better.  He has continued doxycycline as well as daily dose.  He does not report any purulence.  Only with a bit of bloody drainage on the bandage.  No increased swelling redness.  No pain although he does have neuropathy.  No other concerns.  No fevers or chills.  Objective: AAO x3, NAD DP/PT pulses palpable bilaterally, CRT less than 3 seconds Right: On the right foot submetatarsal 2 is a superficial area of skin breakdown along the area where the blister was previously.  There is slight edema but there is no erythema.  There is no fluctuation or crepitation.  No cellulitis present.  There is no malodor.  Not able to appreciate any drainage today only.  Some of bloody drainage on the bandage. No pain with calf compression, swelling, warmth, erythema  Assessment: Superficial abscess right foot  Plan: -All treatment options discussed with the patient including all alternatives, risks, complications.  -Overall symptoms continuing to improve.  Does not significant tissue debrided today.  Continue daily dressing changes, offloading.  Finish course of antibiotics.  Reviewed wound culture today. -Monitor for any clinical signs or symptoms of infection and directed to call the office immediately should any occur or go to the ER.  Return in about 3 weeks (around 02/12/2024) for ulcer.  Vivi Barrack DPM

## 2024-01-28 ENCOUNTER — Ambulatory Visit: Payer: Medicare HMO | Admitting: Podiatry

## 2024-01-30 ENCOUNTER — Encounter: Payer: Self-pay | Admitting: Podiatry

## 2024-01-30 ENCOUNTER — Telehealth: Payer: Self-pay | Admitting: Cardiology

## 2024-01-30 NOTE — Telephone Encounter (Signed)
   Pre-operative Risk Assessment    Patient Name: [name] [name]  DOB: 1956/05/09 MRN: [id number]   Date of last office visit: 09/12/2023 Date of next office visit: none   Request for Surgical Clearance    Procedure:   colonoscopy  Date of Surgery:  Clearance 03/05/24                                Surgeon:  Dr. Charlott Rakes Surgeon's Group or Practice Name:  [facility]  Phone number:  [phone] Fax number:  [phone]   Type of Clearance Requested:   - Medical  - Pharmacy:  Hold Aspirin need instruction   Type of Anesthesia:   propofol   Additional requests/questions:    Sharen Hones   01/30/2024, 4:47 PM

## 2024-01-31 ENCOUNTER — Ambulatory Visit: Admitting: Podiatry

## 2024-01-31 ENCOUNTER — Encounter: Payer: Self-pay | Admitting: Podiatry

## 2024-01-31 ENCOUNTER — Telehealth: Payer: Self-pay | Admitting: *Deleted

## 2024-01-31 DIAGNOSIS — L02611 Cutaneous abscess of right foot: Secondary | ICD-10-CM

## 2024-01-31 MED ORDER — SILVER SULFADIAZINE 1 % EX CREA: 1.0000 | TOPICAL_CREAM | Freq: Every day | CUTANEOUS | 0 refills | Status: DC

## 2024-01-31 MED ORDER — DOXYCYCLINE HYCLATE 100 MG PO TABS: 100.0000 mg | ORAL_TABLET | Freq: Two times a day (BID) | ORAL | 1 refills | Status: DC

## 2024-01-31 NOTE — Telephone Encounter (Signed)
Left message to call back and schedule tele pre op appt

## 2024-01-31 NOTE — Progress Notes (Signed)
 Subjective:   Patient ID: [name] [name], male   DOB: 68 y.o.   MRN: [id number]   HPI Patient was concerned because he had developed a blister on the bottom of his right foot and has had history of amputation by Dr. Ardelle Anton and was treated by this previously.  Stated he had been doing well he wanted this checked with his A1c improving   ROS      Objective:  Physical Exam  Neurovascular status unchanged from previously with irritation around the plantar second metatarsal right localized no proximal edema erythema drainage but there is obvious  localized abscess in the area.  No odor or other indication of infection pathology.  It measures approximately 1 cm x 1 cm in the plantar aspect right second metatarsal     Assessment:  Probability for traumatic abscess right plantar forefoot with distribution issues secondary to previous amputation with the patient having diabetic shoes that were made 2 years ago that appears to be local with no indication of systemic issue     Plan:  H&P anesthetized the area and went ahead using sterile instrumentation opened up the area noted a sterile fluid there was no purulent fluid formation.  I went ahead flushed the area out I applied Silvadene and patient will use Silvadene at home with sterile dressing and try to reduce weightbearing and patient will do well with a new diabetic shoe with toe filler.  Placed back on doxycycline currently with strict instructions if any systemic signs of infection or proximal edema erythema drainage noted patient is to go straight to the emergency room.  Patient to be seen back 2 weeks earlier if any issues were to occur

## 2024-01-31 NOTE — Telephone Encounter (Signed)
 Pt has been scheduled for a tele visit, 02/06/24.  Consent on file / medications reconciled.

## 2024-01-31 NOTE — Telephone Encounter (Signed)
 Pt has been scheduled for a tele visit, 02/06/24.  Consent on file / medications reconciled.    Patient Consent for Virtual Visit        [name] [name] has provided verbal consent on 01/31/2024 for a virtual visit (video or telephone).   CONSENT FOR VIRTUAL VISIT FOR:  [name] [name]  By participating in this virtual visit I agree to the following:  I hereby voluntarily request, consent and authorize [facility] and its employed or contracted physicians, physician assistants, nurse practitioners or other licensed health care professionals (the Practitioner), to provide me with telemedicine health care services (the "Services") as deemed necessary by the treating Practitioner. I acknowledge and consent to receive the Services by the Practitioner via telemedicine. I understand that the telemedicine visit will involve communicating with the Practitioner through live audiovisual communication technology and the disclosure of certain medical information by electronic transmission. I acknowledge that I have been given the opportunity to request an in-person assessment or other available alternative prior to the telemedicine visit and am voluntarily participating in the telemedicine visit.  I understand that I have the right to withhold or withdraw my consent to the use of telemedicine in the course of my care at any time, without affecting my right to future care or treatment, and that the Practitioner or I may terminate the telemedicine visit at any time. I understand that I have the right to inspect all information obtained and/or recorded in the course of the telemedicine visit and may receive copies of available information for a reasonable fee.  I understand that some of the potential risks of receiving the Services via telemedicine include:  Delay or interruption in medical evaluation due to technological equipment failure or disruption; Information transmitted may not be sufficient (e.g.  poor resolution of images) to allow for appropriate medical decision making by the Practitioner; and/or  In rare instances, security protocols could fail, causing a breach of personal health information.  Furthermore, I acknowledge that it is my responsibility to provide information about my medical history, conditions and care that is complete and accurate to the best of my ability. I acknowledge that Practitioner's advice, recommendations, and/or decision may be based on factors not within their control, such as incomplete or inaccurate data provided by me or distortions of diagnostic images or specimens that may result from electronic transmissions. I understand that the practice of medicine is not an exact science and that Practitioner makes no warranties or guarantees regarding treatment outcomes. I acknowledge that a copy of this consent can be made available to me via my patient portal [facility] MyChart), or I can request a printed copy by calling the office of [facility].    I understand that my insurance will be billed for this visit.   I have read or had this consent read to me. I understand the contents of this consent, which adequately explains the benefits and risks of the Services being provided via telemedicine.  I have been provided ample opportunity to ask questions regarding this consent and the Services and have had my questions answered to my satisfaction. I give my informed consent for the services to be provided through the use of telemedicine in my medical care

## 2024-01-31 NOTE — Telephone Encounter (Signed)
   Name: [name] [name]  DOB: 30-May-1956  MRN: [id number]  Primary Cardiologist: Donato Schultz, MD   Preoperative team, please contact this patient and set up a phone call appointment for further preoperative risk assessment. Please obtain consent and complete medication review. Thank you for your help.  I confirm that guidance regarding antiplatelet and oral anticoagulation therapy has been completed and, if necessary, noted below.  Per protocol patient's aspirin should be continued throughout the perioperative period.  I also confirmed the patient resides in the state of [place]. As per [facility] Medical Board telemedicine laws, the patient must reside in the state in which the provider is licensed.   Ronney Asters, NP 01/31/2024, 2:05 PM [facility]

## 2024-02-01 ENCOUNTER — Ambulatory Visit: Payer: Medicare HMO | Admitting: Podiatry

## 2024-02-04 ENCOUNTER — Encounter: Payer: Self-pay | Admitting: Podiatry

## 2024-02-05 DIAGNOSIS — L0390 Cellulitis, unspecified: Secondary | ICD-10-CM | POA: Diagnosis not present

## 2024-02-06 ENCOUNTER — Ambulatory Visit: Attending: Cardiovascular Disease

## 2024-02-06 DIAGNOSIS — Z01810 Encounter for preprocedural cardiovascular examination: Secondary | ICD-10-CM | POA: Diagnosis not present

## 2024-02-06 NOTE — Progress Notes (Signed)
 Virtual Visit via Telephone Note   Because of [name] [name] co-morbid illnesses, he is at least at moderate risk for complications without adequate follow up.  This format is felt to be most appropriate for this patient at this time.  Due to technical limitations with video connection (technology), today's appointment will be conducted as an audio only telehealth visit, and [name] [name] verbally agreed to proceed in this manner.   All issues noted in this document were discussed and addressed.  No physical exam could be performed with this format.  Evaluation Performed:  Preoperative cardiovascular risk assessment _____________   Date:  02/06/2024   Patient ID:  [name] [name], DOB 1956-10-26, MRN [id number] Patient Location:  Home Provider location:   Office  Primary Care Provider:  Tally Joe, MD Primary Cardiologist:  Donato Schultz, MD  Chief Complaint / Patient Profile   68 y.o. y/o male with a h/o coronary artery disease s/p CABGx5 in 2011, hypertension, CKD stage IIIb, type 2 diabetes mellitus, hyperlipidemia, OSA, partial right second toe amputation who is pending colonoscopy on 03/05/2024 with Dr. Charlott Rakes And presents today for telephonic preoperative cardiovascular risk assessment.  History of Present Illness    [name] [name] is a 68 y.o. male who presents via audio/video conferencing for a telehealth visit today.  Pt was last seen in cardiology clinic on 09/12/2023 by Dr. Anne Fu.  At that time [name] [name] was doing well.  The patient is now pending procedure as outlined above. Since his last visit, he has remained stable from a cardiac standpoint.  Today he denies chest pain, shortness of breath, lower extremity edema, fatigue, palpitations, melena, hematuria, hemoptysis, diaphoresis, weakness, presyncope, syncope, orthopnea, and PND.   Past Medical History    Past Medical History:  Diagnosis Date   Anxiety    Baker's cyst 10/06/2021   Bulbous urethral  stricture    chronic--- hx multiple dilatation's and urolume stent's   CAD (coronary artery disease) 04/ 2011  (previous cardiologist-  dr h. Katrinka Blazing , lov 06/ 2016)  currently followed by pcp   03-03-2010  CABG x 5, LIMA to LAD, left radial artery to OM1,SVG to R1, seqSVG to PDA and PLA   Chronic kidney disease    Stage 3b, Follows w/ PCP.   Cyanosis 10/06/2021   right foot   Depression    Follows w/ PCP, Dr. Tally Joe.   ED (erectile dysfunction)    Essential hypertension, benign    Follows w/ PCP and cardiologist, Dr. Donato Schultz.   GERD (gastroesophageal reflux disease)    History of pancreatitis 2005   Knee effusion 10/06/2021   Lung nodule    11/03/23 Chest CT, Patient follows w/ Pulmonolgy, LOV 11/09/23.   Mixed hyperlipidemia    OSA on CPAP    hx of OSA when patient was over 300 lbs, no longer uses CPAP since losing weight   S/P CABG x 5 03/03/2010   Septic arthritis of knee, left (HCC) 11/11/2021   Type II or unspecified type diabetes mellitus without mention of complication, not stated as uncontrolled    endocrinologist-  dr Sharl Ma / As of 11/20/23, patient states that he only uses insulin if his blood surgar get's really high. Controlling DM with Ozempic and diet.   Wears glasses    reading only   Past Surgical History:  Procedure Laterality Date   AMPUTATION Right 08/09/2021   Procedure: AMPUTATION DIGIT;  Surgeon: Vivi Barrack, DPM;  Location: [facility];  Service: Podiatry;  Laterality: Right;   CARDIAC CATHETERIZATION  03-01-2010   dr Verdis Prime   nuclear stress test --positive moderate ishemia:  severe multivessel CAD w/ total occlusion of pLAD, HG obstrucion of pPDA with supplies collaterals around the apex to the LAD, severe stenosis distal RCA, total occlusion OM1, 75% D1, normal LVF    CORONARY ARTERY BYPASS GRAFT  03-03-2010  dr hendrickson    LIMA to LAD, SVG to RI, left radial artery to OM1, seqSVG  to PDA and PLA   CYST EXCISION Left 11/02/2021    Procedure: REMOVAL OF LEFT CALF MASS;  Surgeon: Tarry Kos, MD;  Location: [facility];  Service: Orthopedics;  Laterality: Left;   CYSTO W/ DILATATION , LASER INCISION OF URETHRAL STRICTURE  02-25-2005   dr Vernie Ammons  [facility]   CYSTO/  DILATION URETHRAL MEATAL STENOSIS AND DILATION BULBOUS STRICTURE  12-21-2004   dr Vonita Moss  [facility]   CYSTO/ RETROGRADE PYELOGRAM/ DILATATION URETHRAL STRICTURE AND PLACEMENT UROLUME STENT  08-04-2011    dr Vernie Ammons  [facility]   CYSTOSCOPY WITH DIRECT VISION INTERNAL URETHROTOMY  07/15/2008   dr Vernie Ammons  [facility]   and UroLume Stent placement   CYSTOSCOPY WITH URETHRAL DILATATION N/A 04/19/2018   Procedure: CYSTOSCOPY WITH BALLOON URETHRAL DILATATION;  Surgeon: Ihor Gully, MD;  Location: [facility];  Service: Urology;  Laterality: N/A;   CYSTOSCOPY WITH URETHRAL DILATATION N/A 11/26/2023   Procedure: URETHRAL DILATATION, BIOPSY OF URETHRA, RETROGRADE URETHOGRAM;  Surgeon: [name] Hick, MD;  Location: [facility];  Service: Urology;  Laterality: N/A;  30 MINUTES NEEDED FOR CASE   INCISION AND DRAINAGE Right 08/13/2021   Procedure: INCISION AND DRAINAGE RIGHT FOOT WOUND;  Surgeon: Vivi Barrack, DPM;  Location: [facility];  Service: Podiatry;  Laterality: Right;   KNEE ARTHROSCOPY Left 11/15/2021   Procedure: LEFT KNEE ARTHROSCOPIC IRRIGATION AND DEBRIDEMENT;  Surgeon: Tarry Kos, MD;  Location: [facility];  Service: Orthopedics;  Laterality: Left;   ORIF TOE FRACTURE Left 05/22/2013   Procedure: OPEN REDUCTION INTERNAL FIXATION (ORIF) LEFT SECOND TOE PROXIMAL PHALANX FRACTURE/LEFT PERCUTANEOUS PINNING;  Surgeon: Toni Arthurs, MD;  Location: [facility];  Service: Orthopedics;  Laterality: Left;   TONSILLECTOMY  child   TOTAL KNEE ARTHROPLASTY Left 08/14/2022   Procedure: LEFT TOTAL KNEE ARTHROPLASTY;  Surgeon: Tarry Kos, MD;  Location: [facility];  Service: Orthopedics;  Laterality: Left;   Allergies Allergies  Allergen Reactions    Glucophage [Metformin Hcl] Other (See Comments)    Upset stomach   Hydrochlorothiazide Other (See Comments)    Hyponatremia   Home Medications    Prior to Admission medications   Medication Sig Start Date End Date Taking? Authorizing Provider  ALPRAZolam Prudy Feeler) 0.5 MG tablet Take 0.5 mg by mouth 2 (two) times daily as needed for anxiety.    [provider]  amLODipine (NORVASC) 10 MG tablet Take 10 mg by mouth at bedtime. 06/14/20   [provider]  aspirin EC 81 MG tablet Take 81 mg by mouth daily. Swallow whole.    [provider]  atorvastatin (LIPITOR) 80 MG tablet Take 80 mg by mouth every evening.    [provider]  buPROPion (WELLBUTRIN XL) 300 MG 24 hr tablet Take 300 mg by mouth every morning.     [provider]  doxycycline (VIBRA-TABS) 100 MG tablet Take 1 tablet (100 mg total) by mouth 2 (two) times daily. 01/31/24   Lenn Sink,  DPM  fenofibrate 160 MG tablet Take 160 mg by mouth every evening. 07/01/14   [provider]  hydrALAZINE (APRESOLINE) 50 MG tablet TAKE 1 TABLET BY MOUTH 2 TIMES DAILY. 12/07/23   Jake Bathe, MD  metoprolol tartrate (LOPRESSOR) 100 MG tablet Take 100 mg by mouth 2 (two) times daily.    [provider]  olmesartan (BENICAR) 40 MG tablet Take 40 mg by mouth daily. 09/23/22   [provider]  omeprazole (PRILOSEC) 20 MG capsule Take 20 mg by mouth every evening.     [provider]  OZEMPIC, 1 MG/DOSE, 4 MG/3ML SOPN Inject into the skin once a week. 06/01/22   [provider]  potassium chloride SA (K-DUR,KLOR-CON) 20 MEQ tablet Take 40 mEq by mouth daily.    [provider]  silver sulfADIAZINE (SILVADENE) 1 % cream Apply 1 Application topically daily. 01/31/24   Lenn Sink, DPM    Physical Exam   Vital Signs:  [name] [name] does not have vital signs available for review today. Given telephonic nature of communication, physical exam is  limited. AAOx3. NAD. Normal affect.  Speech and respirations are unlabored. Accessory Clinical Findings   None Assessment & Plan    1.  Preoperative Cardiovascular Risk Assessment: colonoscopy on 03/05/2024 with Dr. Charlott Rakes   Mr. Coppa perioperative risk of a major cardiac event is 0.9% according to the Revised Cardiac Risk Index (RCRI).  Therefore, he is at low risk for perioperative complications.   His functional capacity is good at 8.97 METs according to the Duke Activity Status Index (DASI). Recommendations: According to ACC/AHA guidelines, no further cardiovascular testing needed.  The patient may proceed to surgery at acceptable risk.   Antiplatelet and/or Anticoagulation Recommendations: The patient should remain on Aspirin without interruption.    The patient was advised that if he develops new symptoms prior to surgery to contact our office to arrange for a follow-up visit, and he verbalized understanding.  A copy of this note will be routed to requesting surgeon.  Time:   Today, I have spent 9 minutes with the patient with telehealth technology discussing medical history, symptoms, and management plan.    Rip Harbour, NP  02/06/2024, 9:47 AM

## 2024-02-08 ENCOUNTER — Ambulatory Visit: Admitting: Podiatry

## 2024-02-08 ENCOUNTER — Ambulatory Visit (INDEPENDENT_AMBULATORY_CARE_PROVIDER_SITE_OTHER)

## 2024-02-08 DIAGNOSIS — L03119 Cellulitis of unspecified part of limb: Secondary | ICD-10-CM

## 2024-02-08 DIAGNOSIS — L97522 Non-pressure chronic ulcer of other part of left foot with fat layer exposed: Secondary | ICD-10-CM

## 2024-02-08 DIAGNOSIS — L03115 Cellulitis of right lower limb: Secondary | ICD-10-CM

## 2024-02-08 DIAGNOSIS — M79672 Pain in left foot: Secondary | ICD-10-CM

## 2024-02-08 DIAGNOSIS — L02619 Cutaneous abscess of unspecified foot: Secondary | ICD-10-CM

## 2024-02-08 MED ORDER — CLINDAMYCIN HCL 300 MG PO CAPS
300.0000 mg | ORAL_CAPSULE | Freq: Three times a day (TID) | ORAL | 0 refills | Status: DC
Start: 2024-02-08 — End: 2024-02-15

## 2024-02-08 NOTE — Progress Notes (Signed)
 Subjective: Chief Complaint  Patient presents with   Callouses    RM#12 Right foot callused area and also left big toe having some redness patient has concerns.Wore shoe that had been to tight and has created an area on left toe.   68 year old male presents the office the above concerns.  He states that right foot has been doing well.  He was seen by Dr. Charlsie Merles last week and was placed on doxycycline which helped but recently he developed a wound on his left big toe.  He is not currently on any antibiotics.  Thinks it started after he wore shoes that were too tight.  No drainage.  No fevers or chills.  Objective: AAO x3, NAD DP/PT pulses palpable bilaterally, CRT less than 3 seconds Right foot: Previous partial first ray potation present.  There is some dry skin present submetatarsal 2 but there is no skin breakdown.  There is no drainage or pus.  There is no fluctuation or crepitation.  There is no malodor. Left foot: There is a new ulceration present distal aspect of toe as pictured below with granulation tissue present.  Measures 1.5 cm in diameter.  There is localized edema and erythema to the hallux without any ascending cellulitis.  There is no change or pus.  No fluctuation or position.  No malodor. No pain with calf compression, swelling, warmth, erythema       Assessment: Left foot ulceration with cellulitis; healing wound right foot  Plan: -All treatment options discussed with the patient including all alternatives, risks, complications.  -See x-ray report below.  I reviewed the x-rays with the patient. -On the left foot recommend Silvadene dressing changes daily.  Prescribed clindamycin.  He has a surgical shoe at home I recommend is aware.  Encouraged elevation.  I -Right foot is doing much better.  Continue with Silvadene dressing changes.  Monitor any bite signs or symptoms of infection or reoccurrence. -Monitor for any clinical signs or symptoms of infection and directed to  call the office immediately should any occur or go to the ER. -Patient encouraged to call the office with any questions, concerns, change in symptoms.   Radiology:  3 views of bilateral feet were obtained and reviewed.  On the right foot previous partial first ray amputation present.  No cortical destruction suggest osteomyelitis.  Left foot with attention to the hallux there is no cortical changes to suggest osteomyelitis.  No soft tissue emphysema.   Vivi Barrack DPM

## 2024-02-09 DIAGNOSIS — E1142 Type 2 diabetes mellitus with diabetic polyneuropathy: Secondary | ICD-10-CM | POA: Diagnosis not present

## 2024-02-15 ENCOUNTER — Ambulatory Visit: Admitting: Podiatry

## 2024-02-15 DIAGNOSIS — M216X1 Other acquired deformities of right foot: Secondary | ICD-10-CM

## 2024-02-15 DIAGNOSIS — L84 Corns and callosities: Secondary | ICD-10-CM

## 2024-02-15 DIAGNOSIS — E1149 Type 2 diabetes mellitus with other diabetic neurological complication: Secondary | ICD-10-CM | POA: Diagnosis not present

## 2024-02-15 DIAGNOSIS — L97522 Non-pressure chronic ulcer of other part of left foot with fat layer exposed: Secondary | ICD-10-CM | POA: Diagnosis not present

## 2024-02-15 MED ORDER — SILVER SULFADIAZINE 1 % EX CREA: 1.0000 | TOPICAL_CREAM | Freq: Every day | CUTANEOUS | 0 refills | Status: AC

## 2024-02-15 MED ORDER — TRIAMCINOLONE ACETONIDE 0.1 % EX CREA: 1.0000 | TOPICAL_CREAM | Freq: Two times a day (BID) | CUTANEOUS | 0 refills | Status: AC

## 2024-02-15 MED ORDER — CLINDAMYCIN HCL 300 MG PO CAPS: 300.0000 mg | ORAL_CAPSULE | Freq: Three times a day (TID) | ORAL | 0 refills | Status: DC

## 2024-02-18 NOTE — Progress Notes (Signed)
 Subjective: Chief Complaint  Patient presents with   Foot Ulcer    RM#12 Right foot ulcer left foot has blister here for follow up care.    68 year old male presents the office the above concerns.  States the right breast been doing well but does remain on the left big toe.  Denies any drainage or pus.  He has not changed his bandage.  Denies any fevers or chills.  No drainage or pus.   Objective: AAO x3, NAD DP/PT pulses palpable bilaterally, CRT less than 3 seconds Right foot: Previous partial first ray amputation present.  Dry skin plantarly there is no skin breakdown or warmth or signs of infection.  This area is been doing well. Left foot: Granular wound still present distal aspect the toe somewhat smaller today.  There is no probing, undermining or tunneling.  There is no drainage or pus or any fluctuation or crepitation.  There is no malodor.  Some localized erythema around the wound, but improving. Skin reaction is in the leg.  There is no drainage or pus or signs of infection.  This is proximal with an area of the wounds.  This is on the left leg. No pain with calf compression, swelling, warmth, erythema   Assessment: Left foot ulceration with cellulitis; healing wound right foot  Plan: -All treatment options discussed with the patient including all alternatives, risks, complications.  -On the left foot recommend Silvadene dressing changes daily.  Continue clindamycin.  Monitoring signs or symptoms of infection or any side effects of the medication immediately should any occur.  Offloading.  Recommend surgical shoe. -Right foot been doing well.  Moisturizer daily, offloading. -Monitor for any clinical signs or symptoms of infection and directed to call the office immediately should any occur or go to the ER.  Return in about 2 weeks (around 02/29/2024) for ulcer.  Vivi Barrack DPM

## 2024-03-04 ENCOUNTER — Ambulatory Visit: Admitting: Podiatry

## 2024-03-04 DIAGNOSIS — L84 Corns and callosities: Secondary | ICD-10-CM | POA: Diagnosis not present

## 2024-03-04 DIAGNOSIS — L97522 Non-pressure chronic ulcer of other part of left foot with fat layer exposed: Secondary | ICD-10-CM | POA: Diagnosis not present

## 2024-03-08 NOTE — Progress Notes (Signed)
 Subjective: No chief complaint on file.    68 year old male presents the office today for follow-up evaluation of ulcer to his left big toe.  He did get some new sandals which are open toed's been helpful.  Has not seen any increase in swelling or redness.  No drainage or pus.  Does not report any fevers or chills.  No other concerns.  Objective: AAO x3, NAD DP/PT pulses palpable bilaterally, CRT less than 3 seconds Right foot: Previous partial first ray amputation present.  Dry skin plantarly there is no skin breakdown or warmth or signs of infection.  There is no recurrence of the wound or infection today. Left foot: The distal aspect of the hallux hyperkeratotic lesion.  Appears the wound is almost completely healed at this time.  Is no probing, undermining or tunneling.  There is no surrounding erythema, ascending cellulitis.  No fluctuation or crepitation.  There is no malodor. No pain with calf compression, swelling, warmth, erythema   Assessment: Left foot ulceration with cellulitis; healing wound right foot  Plan: -All treatment options discussed with the patient including all alternatives, risks, complications.  -Overall the wounds are doing much better and the left hallux is almost completely healed.  Still keep a bandage on this and open toed shoe to avoid excess pressure.  There is no signs of infection at this time but continue to monitor closely.  Charity Conch DPM

## 2024-03-24 ENCOUNTER — Encounter: Payer: Self-pay | Admitting: Podiatry

## 2024-03-24 ENCOUNTER — Ambulatory Visit: Admitting: Podiatry

## 2024-03-24 DIAGNOSIS — E1149 Type 2 diabetes mellitus with other diabetic neurological complication: Secondary | ICD-10-CM

## 2024-03-24 DIAGNOSIS — M2041 Other hammer toe(s) (acquired), right foot: Secondary | ICD-10-CM

## 2024-03-24 DIAGNOSIS — L97522 Non-pressure chronic ulcer of other part of left foot with fat layer exposed: Secondary | ICD-10-CM | POA: Diagnosis not present

## 2024-03-24 DIAGNOSIS — M2042 Other hammer toe(s) (acquired), left foot: Secondary | ICD-10-CM

## 2024-03-24 DIAGNOSIS — L84 Corns and callosities: Secondary | ICD-10-CM

## 2024-03-24 NOTE — Progress Notes (Signed)
 Subjective: Chief Complaint  Patient presents with   Foot Ulcer    RM#12 Follow up on bilateral foot ulcers patient is doing well is in good healing state has no concerns today.    68 year old male presents the office today for follow-up evaluation of ulcer to his left big toe.  States he is doing well.  He did get new shoes which has been helping.  Does not report any open lesions at this time.  No recent injury or changes that he notes.  No fevers or chills.   Objective: AAO x3, NAD-presents with wife DP/PT pulses palpable bilaterally, CRT less than 3 seconds Right foot: Previous partial first ray amputation present.  Preulcerative hyperkeratotic lesion to the submetatarsal 2 as well as distal 3rd and 4th toe without any underlying ulceration, drainage or signs of infection. Left foot: The ulceration segment appears to have healed. Digital contractures present. No pain with calf compression, swelling, warmth, erythema   Assessment: Preulcerative lesions, digital contractures; resolved ulcer  Plan: -All treatment options discussed with the patient including all alternatives, risks, complications.  -Sharply debrided preulcerative calluses without any complications or bleeding to make sure there is no ongoing ulceration which there was not.  The wounds of all healed.  There is no signs of infection.  Continue moisturizer, offloading.  Discussed the importance of daily foot inspection. -Of note he does have a knot on the left leg just below the knee from a fall. He is going to contact his orthopedic surgeon for this.  Return in about 6 weeks (around 05/05/2024) for pre-ulcerative calluses.  Charity Conch DPM

## 2024-03-25 DIAGNOSIS — D127 Benign neoplasm of rectosigmoid junction: Secondary | ICD-10-CM | POA: Diagnosis not present

## 2024-03-25 DIAGNOSIS — D124 Benign neoplasm of descending colon: Secondary | ICD-10-CM | POA: Diagnosis not present

## 2024-03-25 DIAGNOSIS — Z1211 Encounter for screening for malignant neoplasm of colon: Secondary | ICD-10-CM | POA: Diagnosis not present

## 2024-03-25 DIAGNOSIS — D122 Benign neoplasm of ascending colon: Secondary | ICD-10-CM | POA: Diagnosis not present

## 2024-03-25 DIAGNOSIS — K635 Polyp of colon: Secondary | ICD-10-CM | POA: Diagnosis not present

## 2024-03-27 DIAGNOSIS — D127 Benign neoplasm of rectosigmoid junction: Secondary | ICD-10-CM | POA: Diagnosis not present

## 2024-03-27 DIAGNOSIS — D122 Benign neoplasm of ascending colon: Secondary | ICD-10-CM | POA: Diagnosis not present

## 2024-03-27 DIAGNOSIS — D124 Benign neoplasm of descending colon: Secondary | ICD-10-CM | POA: Diagnosis not present

## 2024-04-01 ENCOUNTER — Other Ambulatory Visit (INDEPENDENT_AMBULATORY_CARE_PROVIDER_SITE_OTHER): Payer: Self-pay

## 2024-04-01 ENCOUNTER — Ambulatory Visit: Admitting: Orthopaedic Surgery

## 2024-04-01 DIAGNOSIS — Z96652 Presence of left artificial knee joint: Secondary | ICD-10-CM | POA: Diagnosis not present

## 2024-04-01 DIAGNOSIS — M79605 Pain in left leg: Secondary | ICD-10-CM

## 2024-04-01 NOTE — Progress Notes (Signed)
 Office Visit Note   Patient: [name] [name]           Date of Birth: 09-29-1956           MRN: [id number] Visit Date: 04/01/2024              Requested by: Rae Bugler, MD [phone] [place],  [place] [address] PCP: Rae Bugler, MD   Assessment & Plan: Visit Diagnoses:  1. Status post left knee replacement   2. Pain in left leg     Plan: History of Present Illness [name] Corner "Lesia Raring" is a 68 year old male who presents with a persistent bump on his left lower leg following a fall.  Four weeks ago, he fell during a nature walk at [facility]. He noticed a bump on his left lower leg, which was initially larger and bruised but has since reduced in size, leaving a persistent knot. He experiences no pain or tenderness in the area. He is on a baby aspirin  regimen. X-rays show no fractures, dislocations, or damage to knee replacement components.  Physical Exam MUSCULOSKELETAL: Soft tissue swelling of left lower leg. Skin intact  Focal swelling of anterior tibial skin. Range of motion of left knee is normal. Surgical scar on left knee fully healed.  No signs of infection.  Results RADIOLOGY Knee X-ray: No fractures, dislocations, or broken bones. Knee replacement intact with no damage to components. (04/01/2024)  Assessment and Plan Subcutaneous hematoma of left lower leg Hematoma from superficial vein tear, non-tender, no fractures or dislocations, knee replacement intact. Expected resolution over months, possible permanent skin discoloration. - Monitor for changes in size or symptoms. - Educated on potential skin discoloration.  Follow-Up Instructions: No follow-ups on file.   Orders:  Orders Placed This Encounter  Procedures   XR KNEE 3 VIEW LEFT   XR Tibia/Fibula Left   No orders of the defined types were placed in this encounter.   Subjective: Chief Complaint  Patient presents with   Left Leg - Pain   Imaging: XR KNEE 3 VIEW LEFT Result Date:  04/01/2024 X-rays of the left knee show stable total knee replacement without complication  XR Tibia/Fibula Left Result Date: 04/01/2024 X-rays of the left tib-fib show no acute or structural abnormalities.    PMFS History: Patient Active Problem List   Diagnosis Date Noted   Status post total left knee replacement 08/14/2022   Primary osteoarthritis of left knee 04/07/2022   Effusion, left knee 04/07/2022   Septic arthritis of knee, left (HCC) 11/11/2021   Mass of lower leg, left 11/02/2021   Amputation of right great toe (HCC) 10/31/2021   Anxiety 10/31/2021   Chronic kidney disease, stage 3b (HCC) 10/31/2021   Diabetic renal disease (HCC) 10/31/2021   Gastroesophageal reflux disease without esophagitis 10/31/2021   History of amputation of great toe (HCC) 10/31/2021   Hyperglycemia due to type 2 diabetes mellitus (HCC) 10/31/2021   Leukocytosis 10/31/2021   Long term (current) use of insulin  (HCC) 10/31/2021   Malignant hypertensive chronic kidney disease 10/31/2021   Male hypogonadism 10/31/2021   Mass of left adrenal gland (HCC) 10/31/2021   Obstructive sleep apnea syndrome 10/31/2021   Other specified disorders of bladder 10/31/2021   Polyneuropathy due to type 2 diabetes mellitus (HCC) 10/31/2021   Baker's cyst, left 10/06/2021   Knee effusion, left 10/06/2021   Cyanosis 10/06/2021   Hyponatremia    AKI (acute kidney injury) (HCC)    Elevated LFTs  Post-operative state    Cellulitis of left lower extremity    Streptococcal bacteremia    Cellulitis of right lower extremity    Osteomyelitis of great toe of right foot (HCC)    Osteomyelitis of second toe of right foot (HCC)    Diabetes mellitus due to underlying condition with diabetic autonomic neuropathy, without long-term current use of insulin  (HCC)    Class 3 severe obesity with serious comorbidity and body mass index (BMI) of 45.0 to 49.9 in adult    Toe infection 08/08/2021   CAD (coronary artery disease) of  artery bypass graft 04/21/2015   Hyperlipidemia 04/21/2015   Essential hypertension 04/21/2015   Type 2 diabetes mellitus (HCC) 04/21/2015   Ulcer of foot (HCC) 08/22/2014   Past Medical History:  Diagnosis Date   Anxiety    Baker's cyst 10/06/2021   Bulbous urethral stricture    chronic--- hx multiple dilatation's and urolume stent's   CAD (coronary artery disease) 04/ 2011  (previous cardiologist-  dr h. Felipe Horton , lov 06/ 2016)  currently followed by pcp   03-03-2010  CABG x 5, LIMA to LAD, left radial artery to OM1,SVG to R1, seqSVG to PDA and PLA   Chronic kidney disease    Stage 3b, Follows w/ PCP.   Cyanosis 10/06/2021   right foot   Depression    Follows w/ PCP, Dr. Rae Bugler.   ED (erectile dysfunction)    Essential hypertension, benign    Follows w/ PCP and cardiologist, Dr. Dorothye Gathers.   GERD (gastroesophageal reflux disease)    History of pancreatitis 2005   Knee effusion 10/06/2021   Lung nodule    11/03/23 Chest CT, Patient follows w/ Pulmonolgy, LOV 11/09/23.   Mixed hyperlipidemia    OSA on CPAP    hx of OSA when patient was over 300 lbs, no longer uses CPAP since losing weight   S/P CABG x 5 03/03/2010   Septic arthritis of knee, left (HCC) 11/11/2021   Type II or unspecified type diabetes mellitus without mention of complication, not stated as uncontrolled    endocrinologist-  dr Kathyanne Parkers / As of 11/20/23, patient states that he only uses insulin  if his blood surgar get's really high. Controlling DM with Ozempic and diet.   Wears glasses    reading only    Family History  Problem Relation Age of Onset   Cancer Mother        lung   Hypertension Father    CAD Father    CVA Brother     Past Surgical History:  Procedure Laterality Date   AMPUTATION Right 08/09/2021   Procedure: AMPUTATION DIGIT;  Surgeon: Charity Conch, DPM;  Location: [facility];  Service: Podiatry;  Laterality: Right;   CARDIAC CATHETERIZATION  03-01-2010   dr Kay Parson   nuclear  stress test --positive moderate ishemia:  severe multivessel CAD w/ total occlusion of pLAD, HG obstrucion of pPDA with supplies collaterals around the apex to the LAD, severe stenosis distal RCA, total occlusion OM1, 75% D1, normal LVF    CORONARY ARTERY BYPASS GRAFT  03-03-2010  dr hendrickson    LIMA to LAD, SVG to RI, left radial artery to OM1, seqSVG  to PDA and PLA   CYST EXCISION Left 11/02/2021   Procedure: REMOVAL OF LEFT CALF MASS;  Surgeon: Wes Hamman, MD;  Location: [facility];  Service: Orthopedics;  Laterality: Left;   CYSTO W/ DILATATION , LASER INCISION OF URETHRAL STRICTURE  02-25-2005   dr Grady Lawman  [facility]   CYSTO/  DILATION URETHRAL MEATAL STENOSIS AND DILATION BULBOUS STRICTURE  12-21-2004   dr Milon Aloe  [facility]   CYSTO/ RETROGRADE PYELOGRAM/ DILATATION URETHRAL STRICTURE AND PLACEMENT UROLUME STENT  08-04-2011    dr Grady Lawman  [facility]   CYSTOSCOPY WITH DIRECT VISION INTERNAL URETHROTOMY  07/15/2008   dr Grady Lawman  [facility]   and UroLume Stent placement   CYSTOSCOPY WITH URETHRAL DILATATION N/A 04/19/2018   Procedure: CYSTOSCOPY WITH BALLOON URETHRAL DILATATION;  Surgeon: Ottelin, Mark, MD;  Location: [facility];  Service: Urology;  Laterality: N/A;   CYSTOSCOPY WITH URETHRAL DILATATION N/A 11/26/2023   Procedure: URETHRAL DILATATION, BIOPSY OF URETHRA, RETROGRADE URETHOGRAM;  Surgeon: Lahoma Pigg, MD;  Location: [facility];  Service: Urology;  Laterality: N/A;  30 MINUTES NEEDED FOR CASE   INCISION AND DRAINAGE Right 08/13/2021   Procedure: INCISION AND DRAINAGE RIGHT FOOT WOUND;  Surgeon: Charity Conch, DPM;  Location: [facility];  Service: Podiatry;  Laterality: Right;   KNEE ARTHROSCOPY Left 11/15/2021   Procedure: LEFT KNEE ARTHROSCOPIC IRRIGATION AND DEBRIDEMENT;  Surgeon: Wes Hamman, MD;  Location: [facility];  Service: Orthopedics;  Laterality: Left;   ORIF TOE FRACTURE Left 05/22/2013   Procedure: OPEN REDUCTION INTERNAL FIXATION (ORIF)  LEFT SECOND TOE PROXIMAL PHALANX FRACTURE/LEFT PERCUTANEOUS PINNING;  Surgeon: Amada Backer, MD;  Location: [facility];  Service: Orthopedics;  Laterality: Left;   TONSILLECTOMY  child   TOTAL KNEE ARTHROPLASTY Left 08/14/2022   Procedure: LEFT TOTAL KNEE ARTHROPLASTY;  Surgeon: Wes Hamman, MD;  Location: [facility];  Service: Orthopedics;  Laterality: Left;   Social History   Occupational History   Not on file  Tobacco Use   Smoking status: Never    Passive exposure: Past   Smokeless tobacco: Never  Vaping Use   Vaping status: Never Used  Substance and Sexual Activity   Alcohol use: Not Currently    Comment: rare glass of wine   Drug use: No   Sexual activity: Yes

## 2024-04-11 ENCOUNTER — Encounter: Payer: Self-pay | Admitting: Podiatry

## 2024-04-11 ENCOUNTER — Ambulatory Visit: Admitting: Podiatry

## 2024-04-11 DIAGNOSIS — L97522 Non-pressure chronic ulcer of other part of left foot with fat layer exposed: Secondary | ICD-10-CM

## 2024-04-11 DIAGNOSIS — M216X1 Other acquired deformities of right foot: Secondary | ICD-10-CM

## 2024-04-11 MED ORDER — SILVER SULFADIAZINE 1 % EX CREA: 1.0000 | TOPICAL_CREAM | Freq: Every day | CUTANEOUS | 0 refills | Status: DC

## 2024-04-11 NOTE — Progress Notes (Unsigned)
 Subjective: Chief Complaint  Patient presents with   Foot Ulcer    RM#11 Right foot blister causing pain.   68 year old male presents the office today with concerns of reoccurring blister on the bottom of his right foot submetatarsal 2.  He states he just noticed this.  He has not seen any drainage or pus coming from the area.  No creased swelling or redness of foot.  He does not report any fevers or chills.  Objective: AAO x3, NAD DP/PT pulses palpable bilaterally, CRT less than 3 seconds Previous right hallux, partial first amputation noted.  Recurrent hyperkeratotic tissue, blister with underlying ulceration noted submetatarsal 2.  I debrided the hyperkeratotic tissue to reveal underlying superficial granular wound present.  There is no probing to bone, undermining or tunneling.  There is no surrounding erythema, ascending cellulitis but there is no fluctuation or crepitation.  No odor.  No obvious signs of infection noted today.  This prompted metatarsal head plantarly. No pain with calf compression, swelling, warmth, erythema  Assessment: Prominent metatarsal head resulting in recurrent callus, blister  Plan: -All treatment options discussed with the patient including all alternatives, risks, complications.  -I sharply debrided the hyperkeratotic tissues with the blister without any complications or bleeding.  There is a superficial granular wound noted.  There is no signs of infection today but he is to monitor very closely for this. -Given the reoccurrence of the ulceration and prominent metatarsal head we discussed with him metatarsal head excision.  Discussed pros and cons of doing this ultimately he would like to proceed with this.  Will plan on doing this on June 11.  I will follow-up with him prior to this for reevaluation of the wound and for surgical consent. -Patient encouraged to call the office with any questions, concerns, change in symptoms.   Charity Conch DPM

## 2024-04-19 DIAGNOSIS — E782 Mixed hyperlipidemia: Secondary | ICD-10-CM | POA: Diagnosis not present

## 2024-04-19 DIAGNOSIS — I2510 Atherosclerotic heart disease of native coronary artery without angina pectoris: Secondary | ICD-10-CM | POA: Diagnosis not present

## 2024-04-19 DIAGNOSIS — E1142 Type 2 diabetes mellitus with diabetic polyneuropathy: Secondary | ICD-10-CM | POA: Diagnosis not present

## 2024-04-19 DIAGNOSIS — N1832 Chronic kidney disease, stage 3b: Secondary | ICD-10-CM | POA: Diagnosis not present

## 2024-04-25 ENCOUNTER — Ambulatory Visit: Admitting: Podiatry

## 2024-04-25 DIAGNOSIS — M216X1 Other acquired deformities of right foot: Secondary | ICD-10-CM | POA: Diagnosis not present

## 2024-04-25 DIAGNOSIS — L84 Corns and callosities: Secondary | ICD-10-CM | POA: Diagnosis not present

## 2024-04-26 NOTE — Progress Notes (Signed)
 Subjective: Chief Complaint  Patient presents with   Callouses    Rm 14 Patient is her for a pre-ulcerative callus on the ball of the right foot. Patient states pain has improved. Patient is treating callus with silvadene .  Patient is seeking a surgical consult today.     68 year old male presents the office today for follow-up evaluation of the wound on the bottom of his right foot.  He states he is doing much better.  He thinks he was padding it too much.  He was could have surgery to remove the metatarsal head which he still wants to do but he did not need to have cataract surgery as well.  He was to discuss timing of these 2 surgeries.  Currently denies any fevers or chills.  Objective: AAO x3, NAD DP/PT pulses palpable bilaterally, CRT less than 3 seconds Previous right hallux, partial first amputation noted.  Submetatarsal 2 resulted in a preulcerative callus.  There is no blister or open lesion identified today but is still preulcerative.  There is no edema, erythema or any signs of infection noted today. Digital contractures present. No pain with calf compression, swelling, warmth, erythema  Assessment: Prominent metatarsal head resulting in recurrent callus/ulcer  Plan: -All treatment options discussed with the patient including all alternatives, risks, complications.  -Today I sharply debrided the hyperkeratotic lesion without any complications or bleeding.  We discussed both conservative as well as surgical options.  He will need to proceed with surgery however he is getting need to have cataract surgery.  Now that the foot is stable we will get a hold off on the foot surgery for now until his cataracts are completed over the next couple of weeks.  However he is to monitor the foot closely for any signs or symptoms of infection or any changes and to let me know immediately should any occur.  Return in about 3 weeks (around 05/16/2024) for pre-ulcerative calluses.  Charity Conch  DPM

## 2024-05-05 ENCOUNTER — Ambulatory Visit: Admitting: Podiatry

## 2024-05-09 DIAGNOSIS — E1142 Type 2 diabetes mellitus with diabetic polyneuropathy: Secondary | ICD-10-CM | POA: Diagnosis not present

## 2024-05-12 DIAGNOSIS — R109 Unspecified abdominal pain: Secondary | ICD-10-CM | POA: Diagnosis not present

## 2024-05-12 DIAGNOSIS — R197 Diarrhea, unspecified: Secondary | ICD-10-CM | POA: Diagnosis not present

## 2024-05-16 ENCOUNTER — Telehealth: Payer: Self-pay | Admitting: Podiatry

## 2024-05-16 ENCOUNTER — Ambulatory Visit: Admitting: Podiatry

## 2024-05-16 DIAGNOSIS — M216X1 Other acquired deformities of right foot: Secondary | ICD-10-CM | POA: Diagnosis not present

## 2024-05-16 DIAGNOSIS — L84 Corns and callosities: Secondary | ICD-10-CM

## 2024-05-16 NOTE — Telephone Encounter (Signed)
 PER JUDE AT [employer] NO PRIOR AUTH IS REQUIRED FOR CPT 28112.   REF # [id number]

## 2024-05-16 NOTE — Patient Instructions (Signed)

## 2024-05-17 NOTE — Progress Notes (Signed)
 Subjective: Chief Complaint  Patient presents with   Foot Ulcer    RM#12 Follow up on right foot ulcer patient states has black spots on ulcer area but no drainage or signs of infection.     68 year old male presents the office today for follow-up evaluation of the wound on the bottom of his right foot.  States the wound is healed.  Cataract surgery is not scheduled until later in the summer and he was doing proceed with surgery on his right foot.  He presents today with his wife.    Objective: AAO x3, NAD DP/PT pulses palpable bilaterally, CRT less than 3 seconds Previous right hallux, partial first amputation noted.  Submetatarsal 2 resulted in a preulcerative callus.  There is no blister or open lesion identified today but is still preulcerative there is a callus present with some dried blood present underneath this.  There is no edema, erythema or any signs of infection noted today.  This is a just lateral to this is 1 small dark spot which I debrided and could represent a small foreign body but there is no underlying puncture wound or open lesion.  No drainage or pus. Digital contractures present. No pain with calf compression, swelling, warmth, erythema  Assessment: Prominent metatarsal head resulting in recurrent callus/ulcer; possible superficial foreign body  Plan: -All treatment options discussed with the patient including all alternatives, risks, complications.  -Sharply debrided the area of the concern of the foreign body without any complications or bleeding.  Is able to remove 1 small superficial area and afterwards underlying skin intact.  No puncture wound or open lesions.  No signs of infection.  Continue to monitor. -Debrided the preulcerative hyperkeratotic lesion right foot without any complications or bleeding. -At this time he wants to proceed with surgery as we discussed previously to remove the second metatarsal head.  This is resulted in preulcerative calluses;  ongoing reoccurring issue.  We discussed metatarsal head excision.  Wants to proceed with this.  Will likely plan on doing this next week. -The incision placement as well as the postoperative course was discussed with the patient. I discussed risks of the surgery which include, but not limited to, infection, bleeding, pain, swelling, need for further surgery, delayed or nonhealing, painful or ugly scar, numbness or sensation changes, over/under correction, recurrence, transfer lesions, further deformity, hardware failure, DVT/PE, loss of toe/foot. Patient understands these risks and wishes to proceed with surgery. The surgical consent was reviewed with the patient all 3 pages were signed. No promises or guarantees were given to the outcome of the procedure. All questions were answered to the best of my ability. Before the surgery the patient was encouraged to call the office if there is any further questions. The surgery will be performed at the [facility] on an outpatient basis.  No follow-ups on file.  Donnice JONELLE Fees DPM

## 2024-05-21 ENCOUNTER — Other Ambulatory Visit: Payer: Self-pay | Admitting: Podiatry

## 2024-05-21 DIAGNOSIS — S92321A Displaced fracture of second metatarsal bone, right foot, initial encounter for closed fracture: Secondary | ICD-10-CM | POA: Diagnosis not present

## 2024-05-21 DIAGNOSIS — M21541 Acquired clubfoot, right foot: Secondary | ICD-10-CM | POA: Diagnosis not present

## 2024-05-21 DIAGNOSIS — Z4889 Encounter for other specified surgical aftercare: Secondary | ICD-10-CM | POA: Diagnosis not present

## 2024-05-21 DIAGNOSIS — M216X1 Other acquired deformities of right foot: Secondary | ICD-10-CM | POA: Diagnosis not present

## 2024-05-21 HISTORY — PX: OTHER SURGICAL HISTORY: SHX169

## 2024-05-21 MED ORDER — HYDROCODONE-ACETAMINOPHEN 5-325 MG PO TABS: 1.0000 | ORAL_TABLET | Freq: Four times a day (QID) | ORAL | 0 refills | Status: DC | PRN

## 2024-05-21 MED ORDER — DOXYCYCLINE HYCLATE 100 MG PO TABS
100.0000 mg | ORAL_TABLET | Freq: Two times a day (BID) | ORAL | 0 refills | Status: DC
Start: 2024-05-21 — End: 2024-07-10

## 2024-05-21 MED ORDER — PROMETHAZINE HCL 25 MG PO TABS: 25.0000 mg | ORAL_TABLET | Freq: Three times a day (TID) | ORAL | 0 refills | Status: AC | PRN

## 2024-05-21 NOTE — Progress Notes (Signed)
Postop medications sent 

## 2024-05-26 ENCOUNTER — Encounter: Payer: Self-pay | Admitting: Podiatry

## 2024-05-26 ENCOUNTER — Ambulatory Visit (INDEPENDENT_AMBULATORY_CARE_PROVIDER_SITE_OTHER): Admitting: Podiatry

## 2024-05-26 VITALS — BP 140/77 | HR 68 | Temp 97.0°F | Resp 18

## 2024-05-26 DIAGNOSIS — Z9889 Other specified postprocedural states: Secondary | ICD-10-CM

## 2024-05-26 NOTE — Progress Notes (Signed)
 Subjective:  Patient ID: [name] [name], male    DOB: [birth year],  MRN: [id number]  [name] [name] presents to clinic today for:  Chief Complaint  Patient presents with   Post-op Follow-up    Right foot. Pain level has been low. Cam boot has been biggest issue, rubbing and uncomfortable. Surgical site intact and sutures in place. Has not felt as if there was swelling present. He is WB. Denies fever/nausea/vomiting. Has one day left of antibiotics.    Patient is one week postop after undergoing a right 2nd met head excision .  Patient denies fever, chills, night sweats, nausea/vomiting.  Patient denies chest pain or shortness of breath.  Patient denies calf pain.  He notes that the cam walker has been irritating the front of his shin.  He states no one showed him at the hospital/surgery center in town to use the pump.  He was only instructed not to touch it so he has not done anything.  He states has been rubbing the front of the shin and is very uncomfortable.  He experimented with the pads but did not feel like they helped.  His wife is with him today.  He has minimal to no pain and has not felt the need to take much of the pain medication  Past Medical History:  Diagnosis Date   Anxiety    Baker's cyst 10/06/2021   Bulbous urethral stricture    chronic--- hx multiple dilatation's and urolume stent's   CAD (coronary artery disease) 04/ 2011  (previous cardiologist-  dr h. claudene , lov 06/ 2016)  currently followed by pcp   03-03-2010  CABG x 5, LIMA to LAD, left radial artery to OM1,SVG to R1, seqSVG to PDA and PLA   Chronic kidney disease    Stage 3b, Follows w/ PCP.   Cyanosis 10/06/2021   right foot   Depression    Follows w/ PCP, Dr. Alm Rav.   ED (erectile dysfunction)    Essential hypertension, benign    Follows w/ PCP and cardiologist, Dr. Oneil Parchment.   GERD (gastroesophageal reflux disease)    History of pancreatitis 2005   Knee effusion 10/06/2021   Lung nodule     11/03/23 Chest CT, Patient follows w/ Pulmonolgy, LOV 11/09/23.   Mixed hyperlipidemia    OSA on CPAP    hx of OSA when patient was over 300 lbs, no longer uses CPAP since losing weight   S/P CABG x 5 03/03/2010   Septic arthritis of knee, left (HCC) 11/11/2021   Type II or unspecified type diabetes mellitus without mention of complication, not stated as uncontrolled    endocrinologist-  dr faythe / As of 11/20/23, patient states that he only uses insulin  if his blood surgar get's really high. Controlling DM with Ozempic and diet.   Wears glasses    reading only    Past Surgical History:  Procedure Laterality Date   AMPUTATION Right 08/09/2021   Procedure: AMPUTATION DIGIT;  Surgeon: Gershon Donnice SAUNDERS, DPM;  Location: [facility];  Service: Podiatry;  Laterality: Right;   CARDIAC CATHETERIZATION  03-01-2010   dr victory claudene   nuclear stress test --positive moderate ishemia:  severe multivessel CAD w/ total occlusion of pLAD, HG obstrucion of pPDA with supplies collaterals around the apex to the LAD, severe stenosis distal RCA, total occlusion OM1, 75% D1, normal LVF    CORONARY ARTERY BYPASS GRAFT  03-03-2010  dr kerrin  LIMA to LAD, SVG to RI, left radial artery to OM1, seqSVG  to PDA and PLA   CYST EXCISION Left 11/02/2021   Procedure: REMOVAL OF LEFT CALF MASS;  Surgeon: Jerri Kay HERO, MD;  Location: [facility];  Service: Orthopedics;  Laterality: Left;   CYSTO W/ DILATATION , LASER INCISION OF URETHRAL STRICTURE  02-25-2005   dr ceil  [facility]   CYSTO/  DILATION URETHRAL MEATAL STENOSIS AND DILATION BULBOUS STRICTURE  12-21-2004   dr andra  [facility]   CYSTO/ RETROGRADE PYELOGRAM/ DILATATION URETHRAL STRICTURE AND PLACEMENT UROLUME STENT  08-04-2011    dr ceil  [facility]   CYSTOSCOPY WITH DIRECT VISION INTERNAL URETHROTOMY  07/15/2008   dr ceil  [facility]   and UroLume Stent placement   CYSTOSCOPY WITH URETHRAL DILATATION N/A 04/19/2018   Procedure: CYSTOSCOPY WITH BALLOON  URETHRAL DILATATION;  Surgeon: Ottelin, Mark, MD;  Location: [facility];  Service: Urology;  Laterality: N/A;   CYSTOSCOPY WITH URETHRAL DILATATION N/A 11/26/2023   Procedure: URETHRAL DILATATION, BIOPSY OF URETHRA, RETROGRADE URETHOGRAM;  Surgeon: Selma Donnice SAUNDERS, MD;  Location: [facility];  Service: Urology;  Laterality: N/A;  30 MINUTES NEEDED FOR CASE   INCISION AND DRAINAGE Right 08/13/2021   Procedure: INCISION AND DRAINAGE RIGHT FOOT WOUND;  Surgeon: Gershon Donnice SAUNDERS, DPM;  Location: [facility];  Service: Podiatry;  Laterality: Right;   KNEE ARTHROSCOPY Left 11/15/2021   Procedure: LEFT KNEE ARTHROSCOPIC IRRIGATION AND DEBRIDEMENT;  Surgeon: Jerri Kay HERO, MD;  Location: [facility];  Service: Orthopedics;  Laterality: Left;   metatarsal head resection Right 05/21/2024   right 2nd met head (Dr. Gershon)   ORIF TOE FRACTURE Left 05/22/2013   Procedure: OPEN REDUCTION INTERNAL FIXATION (ORIF) LEFT SECOND TOE PROXIMAL PHALANX FRACTURE/LEFT PERCUTANEOUS PINNING;  Surgeon: Norleen Armor, MD;  Location: [facility];  Service: Orthopedics;  Laterality: Left;   TONSILLECTOMY  child   TOTAL KNEE ARTHROPLASTY Left 08/14/2022   Procedure: LEFT TOTAL KNEE ARTHROPLASTY;  Surgeon: Jerri Kay HERO, MD;  Location: [facility];  Service: Orthopedics;  Laterality: Left;    Allergies  Allergen Reactions   Glucophage [Metformin Hcl] Other (See Comments)    Upset stomach   Hydrochlorothiazide  Other (See Comments)    Hyponatremia    Objective:  Vitals:   05/26/24 1051  BP: (!) 140/77  Pulse: 68  Resp: 18  Temp: (!) 97 F (36.1 C)  SpO2: 97%    Physical Examination: There are palpable pedal pulses.  There is minimal localized edema to the surgical area.  The incision is well-approximated and the sutures are holding well.  No active drainage is noted.  No clinical signs of infection are seen.  No pain on palpation to the area  Assessment/Plan: 1. Post-operative state     The incision  was redressed with Xeroform gauze and a dry sterile dressing was applied.  An Ace wrap followed.  We modified the pneumatic cam walker to pad the shin area where he has been having discomfort.  He stated that no one has shown him how to properly use the pump bladder.  This was pumped up about 4-5 times and he noted significant improvement with the overall feeling for the boot.  The other option was to possibly switch him to a tall cam walker but he would like to try this until he sees Dr. Gershon.  Patient states that he is scheduled for 2 weeks.  Informed him that the stitches will most likely be  removed at that visit.  Informed him that the foot looks very good for having surgery a week ago.  He was recommended that he continue with elevation and rest ice and minimal weightbearing.   [name] [name], DPM, FACFAS [facility]     [address] [place], [place] [address]                Office [phone]  Fax [phone]

## 2024-06-05 ENCOUNTER — Ambulatory Visit (INDEPENDENT_AMBULATORY_CARE_PROVIDER_SITE_OTHER): Admitting: Podiatry

## 2024-06-05 VITALS — Ht 73.0 in | Wt 230.0 lb

## 2024-06-05 DIAGNOSIS — M216X1 Other acquired deformities of right foot: Secondary | ICD-10-CM

## 2024-06-05 NOTE — Progress Notes (Signed)
 Subjective: Chief Complaint  Patient presents with   Post-op Follow-up    RM 13 Post-op visit # 2 Suture removal. Patient states no major pain. Surgical site show no signs of infection ( no redness, swelling or foul drainage).   68 year old male presents the office today for the above concerns.  States that he is not having any pain he does not report any fevers or chills.  Remain in the cam boot.  No other concerns.  Objective: AAO x3, NAD DP/PT pulses palpable bilaterally, CRT less than 3 seconds Incision on the dorsal aspect of foot appears to be healing well with sutures intact.  There is trace edema.  There is no surrounding erythema, ascending cellulitis there is no drainage or pus or any obvious signs of infection noted today.  No tenderness on exam. On the right foot submetatarsal 2 there is no skin breakdown or any blister formation noted. No pain with calf compression, swelling, warmth, erythema  Assessment: Status post right second metatarsal head excision.  Plan: -All treatment options discussed with the patient including all alternatives, risks, complications.  -Remove the sutures without any complications.  Steri-Strips applied for reinforcement followed by a dressing.  Discussed in the couple of days he can wash the foot with soap and water , dry well and apply a similar bandage.  In the surgical shoe or cam boot until follow-up. Continue to elevate -Monitor for any clinical signs or symptoms of infection and directed to call the office immediately should any occur or go to the ER.  RTC as scheduled for sooner if needed.  Donnice JONELLE Fees DPM

## 2024-06-12 ENCOUNTER — Encounter: Payer: Self-pay | Admitting: Podiatry

## 2024-06-12 ENCOUNTER — Ambulatory Visit: Admitting: Podiatry

## 2024-06-12 DIAGNOSIS — M216X1 Other acquired deformities of right foot: Secondary | ICD-10-CM

## 2024-06-12 DIAGNOSIS — Z9889 Other specified postprocedural states: Secondary | ICD-10-CM

## 2024-06-14 NOTE — Progress Notes (Signed)
 Subjective: Chief Complaint  Patient presents with   Routine Post Op    Rm12  Post op visit right 2nd Head Metatarsal head excision/patient says he is doing well.    68 year old male presents the office today for the above concerns.  Patient has been healing well.  Has been wearing the surgical shoe.  Does not report any drainage or pus.  No fevers or chills.  No other concerns.   Objective: AAO x3, NAD DP/PT pulses palpable bilaterally, CRT less than 3 seconds Incision on the dorsal aspect of foot appears to be healing well and a scar is forming.  There is no evidence of dehiscence.  There is no surrounding erythema, ascending cellulitis.  No drainage or pus.  No fluctuation or crepitation.  There is no malodor. Callus formation submetatarsal 2 without any underlying ulceration, drainage or signs of infection.  No new ulcerations identified today. No pain with calf compression, swelling, warmth, erythema  Assessment: Status post right second metatarsal head excision, healing well  Plan: -All treatment options discussed with the patient including all alternatives, risks, complications.  -Patient is healing well.  Discussed gradual transition to regular shoe along with is not putting pressure on the incision.  Continue elevate and still limit activity for now.  Monitor closely for any signs or symptoms of infection or ulcerations.   Donnice JONELLE Fees DPM

## 2024-06-19 ENCOUNTER — Encounter: Admitting: Podiatry

## 2024-06-19 DIAGNOSIS — E782 Mixed hyperlipidemia: Secondary | ICD-10-CM | POA: Diagnosis not present

## 2024-06-19 DIAGNOSIS — E1142 Type 2 diabetes mellitus with diabetic polyneuropathy: Secondary | ICD-10-CM | POA: Diagnosis not present

## 2024-06-19 DIAGNOSIS — N1832 Chronic kidney disease, stage 3b: Secondary | ICD-10-CM | POA: Diagnosis not present

## 2024-06-19 DIAGNOSIS — I2510 Atherosclerotic heart disease of native coronary artery without angina pectoris: Secondary | ICD-10-CM | POA: Diagnosis not present

## 2024-06-23 DIAGNOSIS — R944 Abnormal results of kidney function studies: Secondary | ICD-10-CM | POA: Diagnosis not present

## 2024-06-26 ENCOUNTER — Encounter: Payer: Self-pay | Admitting: Podiatry

## 2024-06-26 ENCOUNTER — Ambulatory Visit: Admitting: Podiatry

## 2024-06-26 VITALS — Ht 73.0 in | Wt 230.0 lb

## 2024-06-26 DIAGNOSIS — T148XXA Other injury of unspecified body region, initial encounter: Secondary | ICD-10-CM

## 2024-06-26 DIAGNOSIS — L84 Corns and callosities: Secondary | ICD-10-CM

## 2024-06-26 DIAGNOSIS — M216X1 Other acquired deformities of right foot: Secondary | ICD-10-CM

## 2024-06-26 NOTE — Progress Notes (Signed)
 Subjective: Chief Complaint  Patient presents with   Wound Check    Pt is here to f/u on right foot, he states everything is healing well, while walking has minimal pain, no other complaints.     68 year old male presents the office today for the above concerns.  States that he has been doing very well.  Back to his regular shoe.  He does not report any open lesions or any areas of drainage.  Does not endorse any fevers or chills.  No other concerns.   Objective: AAO x3, NAD DP/PT pulses palpable bilaterally, CRT less than 3 seconds Incision on the dorsal aspect of foot appears to be healing well and the scar is formed.  There is hyperkeratotic tissue submetatarsal 2 but there is no underlying ulceration, drainage or signs of infection.  Preulcerative callus is noted at the distal aspect of the 2nd and 3rd toes and upon debridement of the right third toe there is a start of a superficial blister just adjacent to this.  There is no purulence.  There is no edema, erythema.  Hammertoe deformities present. No pain with calf compression, swelling, warmth, erythema  Assessment: Status post right second metatarsal head excision, healing well; preulcerative calluses with blister right third toe  Plan: Status post second metatarsal head excision -Healing well.  Scar is formed.  Continue offloading and discussed moisturizer.  Sharply debrided the callus with any complications or bleeding.  Preulcerative calluses -Sharp debrided lesions in the second toes without any complications or bleeding.  The right third toe there was a blister starting to form adjacent underneath the callus.  The area has been monitored closely.  Recommend antibiotic ointment dressing changes daily, offloading. -Monitor for any clinical signs or symptoms of infection and directed to call the office immediately should any occur or go to the ER.  Return in about 2 weeks (around 07/10/2024).  Donnice JONELLE Fees DPM

## 2024-07-07 ENCOUNTER — Ambulatory Visit: Admitting: Podiatry

## 2024-07-07 DIAGNOSIS — L97511 Non-pressure chronic ulcer of other part of right foot limited to breakdown of skin: Secondary | ICD-10-CM

## 2024-07-07 DIAGNOSIS — T148XXA Other injury of unspecified body region, initial encounter: Secondary | ICD-10-CM

## 2024-07-07 NOTE — Progress Notes (Signed)
 Subjective: Chief Complaint  Patient presents with   Prominent metatarsal head, right    Pt stated that he is doing well he stated that the pain is better than it was     68 year old male presents the office today for the above concerns.  States that he has been doing very well.  Back to his regular shoe.  He states this is the best that the foot has felt in some time.  He did try to wrap the third toe.  Objective: AAO x3, NAD DP/PT pulses palpable bilaterally, CRT less than 3 seconds Incision on the dorsal aspect of foot appears to be healing well and the scar is formed its metatarsal to minimal callus without any underlying ulceration drainage or signs of infection.  There is a new blister right second toe today.  Superficial a skin breakdown noted.  The blister that was present on the right third toe is resolved but still has a preulcerative lesion noted at the distal aspect.  There is no drainage or pus noted today.  There is no fluctuation or crepitation but there is no malodor. Digital contracture present. No pain with calf compression, swelling, warmth, erythema  Assessment: Status post right second metatarsal head excision, healing well; preulcerative calluses with blister right third toe, new blister 2nd toe  Plan: Status post second metatarsal head excision -Healing well.  Scar is formed.  Continue offloading and discussed moisturizer.  Sharply debrided the callus with any complications or bleeding.  Preulcerative calluses -Sharply debrided lesions on the right second, third toes today.  There is new blister on left second toe.  Betadine  was applied today followed by.  The new blister could be from rubbing of wrapping the toes. Offloading.  Dispensed toe spacers. -Monitor for any clinical signs or symptoms of infection and directed to call the office immediately should any occur or go to the ER.  Return in about 2 weeks (around 07/21/2024) for toe ulcer.  Donnice JONELLE Fees DPM

## 2024-07-10 ENCOUNTER — Other Ambulatory Visit: Payer: Self-pay | Admitting: Podiatry

## 2024-07-10 ENCOUNTER — Encounter: Payer: Self-pay | Admitting: Podiatry

## 2024-07-10 MED ORDER — DOXYCYCLINE HYCLATE 100 MG PO TABS: 100.0000 mg | ORAL_TABLET | Freq: Two times a day (BID) | ORAL | 0 refills | Status: DC

## 2024-07-14 ENCOUNTER — Ambulatory Visit: Admitting: Podiatry

## 2024-07-18 ENCOUNTER — Encounter: Payer: Self-pay | Admitting: Podiatry

## 2024-07-18 ENCOUNTER — Ambulatory Visit: Admitting: Podiatry

## 2024-07-18 DIAGNOSIS — L97511 Non-pressure chronic ulcer of other part of right foot limited to breakdown of skin: Secondary | ICD-10-CM

## 2024-07-18 DIAGNOSIS — M2041 Other hammer toe(s) (acquired), right foot: Secondary | ICD-10-CM

## 2024-07-18 NOTE — Progress Notes (Signed)
 Subjective: Chief Complaint  Patient presents with   Foot Ulcer    Preulcerative calluses right third and fourth distal toe.  !st metatarsal amputation. 6 pain. NIDDM 5.9 A1C light bandage applied.     68 year old male presents the office today for the above concerns.  He still been taking antibiotics as been helping.  He had a blister and calluses to his 3rd and 4th toes.  Doing somewhat better.  He has been continue with dressing changes, offloading.  Desires any fevers or chills.  No other concerns.    Objective: AAO x3, NAD DP/PT pulses palpable bilaterally, CRT less than 3 seconds At the distal aspect of the 3rd and 4th toes with hyperkeratotic tissue.  Upon debridement there is a superficial area of skin breakdown on the third toe without any probing, undermining or tunneling.  There is no drainage or pus.  Mild edema.  There is no fluctuation or crepitation.  Digital contractures are present.   No pain with calf compression, swelling, warmth, erythema  Assessment: Superficial ulceration right third toe, hammertoe contractures and preulcerative callus right fourth toe  Plan: - Sharply debrided hyperkeratotic lesions of the 3rd and 4th toes.  This was superficial area of skin breakdown left third toe.  At this time I want to finish the course of antibiotics and continue with daily dressing changes.  I discussed with her flexor tenotomy will require on doing this next week in the office.  Discussed the procedure as well as postoperative course again today.  He agrees to proceed.  No follow-ups on file.  Donnice JONELLE Fees DPM

## 2024-07-20 DIAGNOSIS — N1832 Chronic kidney disease, stage 3b: Secondary | ICD-10-CM | POA: Diagnosis not present

## 2024-07-20 DIAGNOSIS — I2510 Atherosclerotic heart disease of native coronary artery without angina pectoris: Secondary | ICD-10-CM | POA: Diagnosis not present

## 2024-07-20 DIAGNOSIS — E782 Mixed hyperlipidemia: Secondary | ICD-10-CM | POA: Diagnosis not present

## 2024-07-20 DIAGNOSIS — E1142 Type 2 diabetes mellitus with diabetic polyneuropathy: Secondary | ICD-10-CM | POA: Diagnosis not present

## 2024-07-23 ENCOUNTER — Ambulatory Visit: Admitting: Podiatry

## 2024-07-23 DIAGNOSIS — M2041 Other hammer toe(s) (acquired), right foot: Secondary | ICD-10-CM | POA: Diagnosis not present

## 2024-07-23 MED ORDER — DOXYCYCLINE HYCLATE 100 MG PO TABS: 100.0000 mg | ORAL_TABLET | Freq: Two times a day (BID) | ORAL | 0 refills | Status: AC

## 2024-07-23 NOTE — Progress Notes (Signed)
 Subjective: Chief Complaint  Patient presents with   Flexor Tenotomy    Right foot.    68 year old male presents the office today for flexor tenotomy's of his right 3rd and 4th toes.  He states the wounds are doing much better and he is completed course of antibiotics with the toe still curl and gets calluses.  He wants to proceed with a flexion tenotomy of the affected toes today.  Objective: AAO x3, NAD DP/PT pulses palpable bilaterally, CRT less than 3 seconds Hammertoe contracture present to right foot mostly over the 3rd and 4th toes resulted in preulcerative callus at the distal portion of the toes.  There is no drainage or pus.  There is no fluctuation or crepitation.  No malodor. No pain with calf compression, swelling, warmth, erythema  Assessment: Hammertoe deformity right foot resulting in ulcerations  Plan: -All treatment options discussed with the patient including all alternatives, risks, complications.  -Given recurrence of the ulcerations and preulcerative calluses we discussed flexor tenotomy of the affected toes.  We discussed alternatives, risks, complications.  No promises or guarantees given.  Consent was signed.  I cleansed with alcohol.  Mixture of 6 mL of lidocaine , Marcaine  plain was infiltrated in the regional block fashion.  Skin was then prepped with Betadine .  I then introduced an 18-gauge needle to the plantar aspect of the toe in order to transect the flexor tendon.  This time the toes were sitting in rectus position.  I irrigated with alcohol.  Silvadene  was applied followed by dressing.  Tolerated well. -Postoperative instructions discussed.  Remain in the cam boot for next couple of days.  Once the swelling, pain is improved he can return to regular shoe with a toe crest.  Dispensed. -Monitor for any clinical signs or symptoms of infection and directed to call the office immediately should any occur or go to the ER.  Return in about 2 weeks (around 08/06/2024)  for follow up from flexor tenotomy.  Donnice JONELLE Fees DPM

## 2024-07-24 ENCOUNTER — Ambulatory Visit: Admitting: Podiatry

## 2024-07-29 DIAGNOSIS — H25811 Combined forms of age-related cataract, right eye: Secondary | ICD-10-CM | POA: Diagnosis not present

## 2024-07-29 DIAGNOSIS — H25813 Combined forms of age-related cataract, bilateral: Secondary | ICD-10-CM | POA: Diagnosis not present

## 2024-07-29 DIAGNOSIS — H2511 Age-related nuclear cataract, right eye: Secondary | ICD-10-CM | POA: Diagnosis not present

## 2024-08-05 ENCOUNTER — Encounter: Payer: Self-pay | Admitting: Podiatry

## 2024-08-05 ENCOUNTER — Ambulatory Visit: Admitting: Podiatry

## 2024-08-05 DIAGNOSIS — M216X1 Other acquired deformities of right foot: Secondary | ICD-10-CM

## 2024-08-05 DIAGNOSIS — M2041 Other hammer toe(s) (acquired), right foot: Secondary | ICD-10-CM

## 2024-08-05 DIAGNOSIS — L84 Corns and callosities: Secondary | ICD-10-CM

## 2024-08-06 NOTE — Progress Notes (Signed)
 Subjective: Chief Complaint  Patient presents with   Routine Post Op    Rm13 foloow up flexor tenotomy right 3-4 toes/ patient says he is doing well.     68 year old male presents the office today for follow-up evaluation after undergoing flexor tenotomy's of the right 3rd and 4th toes.  He states this has been doing well and the wounds are healing that he had.  He has not seen any drainage or pus or swelling or redness.  He has secondary concerns of a recurrent callus on the bottom of the right foot.  Does not report any open lesions or any drainage.  Objective: AAO x3, NAD DP/PT pulses palpable bilaterally, CRT less than 3 seconds Hammertoe contracture present to right foot mostly over the 3rd and 4th toes resulted in preulcerative callus at the distal portion of the toes.  There is still some mild hyperkeratotic lesions noted the distal aspect of the toes and very minimal bleeding occurred during debridement of the third toe.  Areas are preulcerative.  There is no erythema warmth or any signs of infection.  Hyperkeratotic lesion submetatarsal 2, 3 area.  There is no underlying ulceration, drainage.  There is prominence of the third metatarsal head although not as significant as what the second was.  No pain with calf compression, swelling, warmth, erythema  Assessment: Hammertoe deformity right foot resulting in ulcerations; prominent metatarsal head resultant hyperkeratotic lesion  Plan: Status post flexor tenotomy (procedure) -Healing well.  Sharply debrided the callus of the distal portion of the toes without any complications and only minimal bleeding occurred on the second toe.  Recommend continue antibiotic ointment dressing changes daily, offloading.  Prominent metatarsal head, hyperkeratotic lesion (separate and distinguishable E/M visit) - Sharply debrided hyperkeratotic lesion there is no underlying ulceration.  This is result of the prominent metatarsal head plantarly.  He  has a mild prominence of the third metatarsal head and while the callus is not completely underneath his metatarsal head we do need to work on further offloading to make sure there is no further ulcerations.  I had Sueanne, pedorthist evaluate the inserts for modifications as well.  Discussed moisturizer, offloading.  Return in about 4 weeks (around 09/02/2024).  [name] [name] DPM

## 2024-08-07 DIAGNOSIS — E1142 Type 2 diabetes mellitus with diabetic polyneuropathy: Secondary | ICD-10-CM | POA: Diagnosis not present

## 2024-08-19 DIAGNOSIS — E1142 Type 2 diabetes mellitus with diabetic polyneuropathy: Secondary | ICD-10-CM | POA: Diagnosis not present

## 2024-08-19 DIAGNOSIS — E782 Mixed hyperlipidemia: Secondary | ICD-10-CM | POA: Diagnosis not present

## 2024-08-19 DIAGNOSIS — N1832 Chronic kidney disease, stage 3b: Secondary | ICD-10-CM | POA: Diagnosis not present

## 2024-08-19 DIAGNOSIS — I2510 Atherosclerotic heart disease of native coronary artery without angina pectoris: Secondary | ICD-10-CM | POA: Diagnosis not present

## 2024-08-20 DIAGNOSIS — H25812 Combined forms of age-related cataract, left eye: Secondary | ICD-10-CM | POA: Diagnosis not present

## 2024-08-29 DIAGNOSIS — H25812 Combined forms of age-related cataract, left eye: Secondary | ICD-10-CM | POA: Diagnosis not present

## 2024-09-04 ENCOUNTER — Encounter: Payer: Self-pay | Admitting: Podiatry

## 2024-09-04 ENCOUNTER — Ambulatory Visit: Admitting: Podiatry

## 2024-09-04 DIAGNOSIS — M216X1 Other acquired deformities of right foot: Secondary | ICD-10-CM

## 2024-09-04 DIAGNOSIS — M2041 Other hammer toe(s) (acquired), right foot: Secondary | ICD-10-CM

## 2024-09-07 NOTE — Progress Notes (Signed)
 Subjective: Chief Complaint  Patient presents with   Hammer Toe    Rm14 F/u Hammertoes right foot /patient says he is doing well and had modifications made on his orthotics and they are helping.     68 year old male presents the office today for follow-up evaluation after undergoing flexor tenotomy's of the right 3rd and 4th toes.  States he has been doing well does not see any skin breakdown.  He has no ulcerations that he reports no areas of drainage.  He states the inserts that were made have been helpful he brings in other pairs of inserts for modifications.  Any pain to his feet at this time.  No new concerns.   Objective: AAO x3, NAD DP/PT pulses palpable bilaterally, CRT less than 3 seconds There are still preulcerative calluses noted the distal aspect of the 3rd and 4th toes but upon debridement there is no ulceration, drainage or any signs of pain.  They are better than what they were prior to the tenotomy's.  Submetatarsal there is no areas of callus formation.  There is no ulcerations identified.  There is no erythema or warmth or any signs of infection. No pain with calf compression, swelling, warmth, erythema  Assessment: Hammertoe deformity right foot resulting in ulcerations; prominent metatarsal head resultant hyperkeratotic lesion  Plan: Status post flexor tenotomy (procedure) -Healing well.  Sharply debrided the callus of the distal portion of the toes without any complications and only minimal bleeding occurred on the second toe.  Recommend continue antibiotic ointment dressing changes daily, offloading.  Prominent metatarsal head, hyperkeratotic lesion (separate and distinguishable E/M visit) - This is resolved. GLENWOOD Hum, pedorthist evaluated the patient's inserts for modifications. - Daily foot inspection  Return in about 6 weeks (around 10/16/2024).  Donnice JONELLE Fees DPM

## 2024-09-19 DIAGNOSIS — E1142 Type 2 diabetes mellitus with diabetic polyneuropathy: Secondary | ICD-10-CM | POA: Diagnosis not present

## 2024-09-19 DIAGNOSIS — N1832 Chronic kidney disease, stage 3b: Secondary | ICD-10-CM | POA: Diagnosis not present

## 2024-09-19 DIAGNOSIS — E782 Mixed hyperlipidemia: Secondary | ICD-10-CM | POA: Diagnosis not present

## 2024-09-19 DIAGNOSIS — I2510 Atherosclerotic heart disease of native coronary artery without angina pectoris: Secondary | ICD-10-CM | POA: Diagnosis not present

## 2024-09-22 ENCOUNTER — Encounter: Payer: Self-pay | Admitting: Radiology

## 2024-09-26 ENCOUNTER — Encounter: Payer: Self-pay | Admitting: Podiatry

## 2024-09-26 ENCOUNTER — Ambulatory Visit: Admitting: Podiatry

## 2024-09-26 DIAGNOSIS — M2042 Other hammer toe(s) (acquired), left foot: Secondary | ICD-10-CM

## 2024-09-26 DIAGNOSIS — M216X1 Other acquired deformities of right foot: Secondary | ICD-10-CM

## 2024-09-26 DIAGNOSIS — L84 Corns and callosities: Secondary | ICD-10-CM

## 2024-09-26 DIAGNOSIS — M2041 Other hammer toe(s) (acquired), right foot: Secondary | ICD-10-CM

## 2024-09-26 NOTE — Progress Notes (Signed)
 Subjective: Chief Complaint  Patient presents with   Foot Ulcer    F/U right foot ulcer. 100% better. Modified orthotics done to off load pressure. NIDDM A1C 4.73.    68 year old male presents the office today for follow-up evaluation after undergoing flexor tenotomy's of the right 3rd and 4th toes.  States he has been doing very well and he is not having any issues.  He does not report any ulcerations.    He states he has a small spot on his left big toe but no open lesions or any swelling redness or any drainage.  No injuries.  He also presents today to pick up refurbished orthotics.    Objective: AAO x3, NAD DP/PT pulses palpable bilaterally, CRT less than 3 seconds Minimal preulcerative calluses noted distal aspect of 3rd and 4th toes in the right foot there is no underlying ulceration, drainage or signs of infection.  Overall doing much better.  He has no pain.  Digital contracture present to right second toe although rigid.  On the left hallux plantarly small amount of dry skin, minimal callus is present there is no ulcerations or any signs of infection.  No open lesions present bilaterally. No pain with calf compression, swelling, warmth, erythema  Assessment: Hammertoe deformity right foot resulting in ulcerations; prominent metatarsal head resultant hyperkeratotic lesion-much improved  Plan: Status post flexor tenotomy (procedure) -Healing well.  Sharply debrided the callus of the distal portion of the toes without any complications and only minimal bleeding occurred on the second toe.  Recommend continue antibiotic ointment dressing changes daily, offloading.  Prominent metatarsal head, hyperkeratotic lesion, digital contractures, small callus left hallux (separate and distinguishable E/M visit) - No ulceration submetatarsal.  He needs to continue offloading given the hammertoe deformities that is present in the second toe on the right foot as well as the left foot.  Recommend a  small Mehta moisturizer daily particularly on the callus to the left plantar hallux.  Monitoring signs or symptoms of infection and/or reoccurrence of the wound.  Refurbished orthotics were dispensed today by [facility]. - Nails were minimally elongated as a courtesy debrided them without any complications or bleeding.  [name] [name] Fees DPM

## 2024-10-14 ENCOUNTER — Ambulatory Visit: Admitting: Podiatry

## 2024-10-19 DIAGNOSIS — E1142 Type 2 diabetes mellitus with diabetic polyneuropathy: Secondary | ICD-10-CM | POA: Diagnosis not present

## 2024-10-19 DIAGNOSIS — N1832 Chronic kidney disease, stage 3b: Secondary | ICD-10-CM | POA: Diagnosis not present

## 2024-10-19 DIAGNOSIS — E782 Mixed hyperlipidemia: Secondary | ICD-10-CM | POA: Diagnosis not present

## 2024-10-19 DIAGNOSIS — I2510 Atherosclerotic heart disease of native coronary artery without angina pectoris: Secondary | ICD-10-CM | POA: Diagnosis not present

## 2024-11-06 ENCOUNTER — Ambulatory Visit (HOSPITAL_BASED_OUTPATIENT_CLINIC_OR_DEPARTMENT_OTHER)
Admission: RE | Admit: 2024-11-06 | Discharge: 2024-11-06 | Disposition: A | Payer: Medicare HMO | Source: Ambulatory Visit | Attending: Acute Care | Admitting: Acute Care

## 2024-11-06 DIAGNOSIS — R911 Solitary pulmonary nodule: Secondary | ICD-10-CM | POA: Insufficient documentation

## 2024-11-27 ENCOUNTER — Ambulatory Visit: Admitting: Podiatry

## 2024-11-27 ENCOUNTER — Encounter: Payer: Self-pay | Admitting: Podiatry

## 2024-11-27 VITALS — Ht 73.0 in | Wt 230.0 lb

## 2024-11-27 DIAGNOSIS — L84 Corns and callosities: Secondary | ICD-10-CM

## 2024-11-27 DIAGNOSIS — M79675 Pain in left toe(s): Secondary | ICD-10-CM | POA: Diagnosis not present

## 2024-11-27 DIAGNOSIS — M216X1 Other acquired deformities of right foot: Secondary | ICD-10-CM

## 2024-11-27 DIAGNOSIS — M79674 Pain in right toe(s): Secondary | ICD-10-CM | POA: Diagnosis not present

## 2024-11-27 DIAGNOSIS — M2041 Other hammer toe(s) (acquired), right foot: Secondary | ICD-10-CM

## 2024-11-27 DIAGNOSIS — E1149 Type 2 diabetes mellitus with other diabetic neurological complication: Secondary | ICD-10-CM

## 2024-11-27 DIAGNOSIS — M2042 Other hammer toe(s) (acquired), left foot: Secondary | ICD-10-CM | POA: Diagnosis not present

## 2024-11-27 DIAGNOSIS — B351 Tinea unguium: Secondary | ICD-10-CM

## 2024-12-02 NOTE — Progress Notes (Signed)
 Subjective: Chief Complaint  Patient presents with   Nail Problem    DFC     69 year old male presents the office today for diabetic foot evaluation as well as for elongated nails as well as preulcerative calluses.  He states that he has been doing well he has not seen any open wounds to his feet.  No recent swelling or redness.  Very minimal callus on the left big toe.  No other areas of skin breakdown he reports.  No new concerns.   PCP: Seabron Lenis, MD  Last seen September 19, 2024  Objective: AAO x3, NAD DP/PT pulses palp DP, PT pulses palpable bilaterally, CRT less than 3 seconds Minimal preulcerative calluses noted on plantar aspect left hallux without any underlying ulceration, drainage or signs of infection.  The areas of calluses in the right foot is resolved. Nails are hypertrophic, dystrophic, brittle, discolored, elongated 10. No surrounding redness or drainage. Tenderness nails 1-5 bilaterally,. No open lesions or pre-ulcerative lesions are identified today. Hammertoes much improved status post tenotomy on the right foot. No pain with calf compression, swelling, warmth, erythema  Assessment: Symptomatic onychomycosis, preulcerative callus left hallux, type 2 diabetes with neuropathy  Plan: Symptomatic onychomycosis - Sharply debrided nails x 9 without complications or bleeding.  Prominent metatarsal head, hyperkeratotic lesion, digital contractures, small callus left hallux  - As a courtesy debrided the callus on the left hallux with any complications or bleeding as it was quite minimal.  The ulceration was resolved on the right foot as well as the preulcerative calluses.  Discussed the importance of daily foot inspection and glucose control as well as continue offloading at all times.  Monitor for any signs or symptoms of infection and/or skin breakdown.  Return in about 2 months (around 01/28/2025).  [name] [name] Fees DPM

## 2024-12-18 ENCOUNTER — Ambulatory Visit (HOSPITAL_BASED_OUTPATIENT_CLINIC_OR_DEPARTMENT_OTHER): Admitting: Cardiology

## 2024-12-18 ENCOUNTER — Encounter (HOSPITAL_BASED_OUTPATIENT_CLINIC_OR_DEPARTMENT_OTHER): Payer: Self-pay | Admitting: Cardiology

## 2024-12-18 VITALS — BP 132/80 | HR 67 | Ht 73.0 in | Wt 242.0 lb

## 2024-12-18 DIAGNOSIS — S98111A Complete traumatic amputation of right great toe, initial encounter: Secondary | ICD-10-CM

## 2024-12-18 DIAGNOSIS — E1159 Type 2 diabetes mellitus with other circulatory complications: Secondary | ICD-10-CM

## 2024-12-18 DIAGNOSIS — S98111D Complete traumatic amputation of right great toe, subsequent encounter: Secondary | ICD-10-CM | POA: Diagnosis not present

## 2024-12-18 DIAGNOSIS — I10 Essential (primary) hypertension: Secondary | ICD-10-CM | POA: Diagnosis not present

## 2024-12-18 DIAGNOSIS — I25709 Atherosclerosis of coronary artery bypass graft(s), unspecified, with unspecified angina pectoris: Secondary | ICD-10-CM | POA: Diagnosis not present

## 2024-12-18 NOTE — Patient Instructions (Signed)
 Medication Instructions:  No changes *If you need a refill on your cardiac medications before your next appointment, please call your pharmacy*  Lab Work: none If you have labs (blood work) drawn today and your tests are completely normal, you will receive your results only by: MyChart Message (if you have MyChart) OR A paper copy in the mail If you have any lab test that is abnormal or we need to change your treatment, we will call you to review the results.  Testing/Procedures: none  Follow-Up: At [facility], you and your health needs are our priority.  As part of our continuing mission to provide you with exceptional heart care, our providers are all part of one team.  This team includes your primary Cardiologist (physician) and Advanced Practice Providers or APPs (Physician Assistants and Nurse Practitioners) who all work together to provide you with the care you need, when you need it.  Your next appointment:   12 month(s)  Provider:   Oneil Parchment, MD

## 2024-12-18 NOTE — Progress Notes (Signed)
 " Cardiology Office Note:  .   Date:  12/18/2024  ID:  [name] [name], DOB [birth year], MRN [id number] PCP: Seabron Lenis, MD  [facility] Providers Cardiologist:  Oneil Parchment, MD     History of Present Illness: .   [name] [name] is a 69 y.o. male Discussed the use of AI scribe software for clinical note transcription with the patient, who gave verbal consent to proceed.  History of Present Illness [name] [name] is a 69 year old male with coronary artery disease post-CABG who presents for follow-up.  Coronary artery disease and cardiac status - History of coronary artery disease, status post coronary artery bypass grafting (CABG) in 2011. - Recent imaging demonstrates atherosclerotic calcification of the aorta, aortic valve, and coronary arteries. - Echocardiogram in 2022 showed preserved left ventricular ejection fraction of 60%. - No chest pain, shortness of breath, or palpitations. - Maintains good blood pressure control.  Hyperlipidemia and antiplatelet therapy - On atorvastatin 80 mg daily for lipid management. - On aspirin  81 mg daily for antiplatelet therapy.  Peripheral vascular disease surveillance - Lower extremity ultrasound in March 2023 showed no evidence of bilateral peripheral arterial disease.  Type 2 diabetes mellitus - Type 2 diabetes managed by endocrinology. - Diabetes is under control; has not required insulin  for several years. - Considering options for managing the cost of Ozempic, which was suggested for cardiovascular benefit.  Chronic kidney disease - Chronic kidney disease stage 3B. - Most recent creatinine level 1.59. - Not on spironolactone due to elevated creatinine.  Musculoskeletal status and mobility - Improved mobility following left knee arthroplasty. - Joined a gym with family to maintain physical activity, especially during cold weather.  History of lower extremity amputation and infection - History of partial right second  toe amputation. - Prior osteomyelitis and septic arthritis resulting in loss of great right toe.  Pulmonary nodule surveillance - History of lung finding under annual surveillance. - Initial concern has resolved; now part of routine monitoring.      ROS: No CP, no SOB  Studies Reviewed: SABRA   EKG Interpretation Date/Time:  Thursday December 18 2024 13:33:08 EST Ventricular Rate:  67 PR Interval:  204 QRS Duration:  100 QT Interval:  376 QTC Calculation: 397 R Axis:   65  Text Interpretation: Normal sinus rhythm Incomplete right bundle branch block When compared with ECG of 12-Sep-2023 08:42, Incomplete right bundle branch block is now Present Confirmed by Parchment Oneil ([id number]) on 12/18/2024 1:41:30 PM    Results Labs Creatinine: 1.59 LDL: Less than 70  Radiology Lower extremity ultrasound (01/2022): No evidence of bilateral peripheral arterial disease Chest CT (11/02/2024): Atherosclerotic calcification of aorta, aortic valve, and coronary arteries; mildly enlarged cardiac silhouette; no pericardial effusion; subtle aortic valve calcification; coronary artery calcification involving right coronary artery and left anterior descending artery; bypass graft markers present; lungs unremarkable (Independently interpreted)  Diagnostic Echocardiogram (2022): Left ventricular ejection fraction 60% Echocardiogram (2023): Tricuspid aortic valve; aortic valve regurgitation not visualized; no aortic stenosis Risk Assessment/Calculations:             Physical Exam:   VS:  BP 132/80 (BP Location: Right Arm, Patient Position: Sitting, Cuff Size: Normal)   Pulse 67   Ht 6' 1 (1.854 m)   Wt 242 lb (109.8 kg)   SpO2 96%   BMI 31.93 kg/m    Wt Readings from Last 3 Encounters:  12/18/24 242 lb (109.8 kg)  11/27/24 230 lb (104.3 kg)  06/26/24 230 lb (104.3 kg)    GEN: Well nourished, well developed in no acute distress NECK: No JVD; No carotid bruits CARDIAC: CABG scar RRR, no murmurs,  no rubs, no gallops RESPIRATORY:  Clear to auscultation without rales, wheezing or rhonchi  ABDOMEN: Soft, non-tender, non-distended EXTREMITIES:  No edema; No deformity   ASSESSMENT AND PLAN: .    Assessment and Plan Assessment & Plan Coronary artery disease status post CABG with angina Status post CABG in 2011 with well-managed coronary artery disease. Recent imaging shows atherosclerotic calcification of coronary arteries, consistent with previous treatment. No new symptoms reported. - Continue atorvastatin 80 mg daily - Continue aspirin  81 mg daily  Aortic valve sclerosis Very mild annular aortic valve calcification noted on recent imaging, consistent with aortic sclerosis. No aortic stenosis or regurgitation on echocardiogram from 2022. No murmur detected on examination. - Will consider repeat echocardiogram to assess for progression of aortic valve sclerosis  Atherosclerosis of aorta Atherosclerotic calcification of the aorta noted on imaging. Managed with current medications including atorvastatin and aspirin . - Continue atorvastatin 80 mg daily - Continue aspirin  81 mg daily  Essential hypertension Blood pressure well-controlled with current medication regimen. - Continue current antihypertensive medications  Type 2 diabetes mellitus with stage 3 chronic kidney disease Type 2 diabetes managed with Ozempic. Concerns about cost of Ozempic noted. Chronic kidney disease stage 3B with creatinine 1.59. No spironolactone due to elevated creatinine. - Discuss cost concerns with primary care provider  Hyperlipidemia Managed with atorvastatin 80 mg daily. LDL goal is less than 70 mg/dL. - Continue atorvastatin 80 mg daily         Dispo: 1 yr  Signed, Oneil Parchment, MD  "

## 2025-01-01 ENCOUNTER — Institutional Professional Consult (permissible substitution): Payer: Self-pay | Admitting: Plastic Surgery

## 2025-01-27 ENCOUNTER — Ambulatory Visit: Admitting: Podiatry
